# Patient Record
Sex: Female | Born: 1955 | Race: White | Hispanic: No | Marital: Married | Smoking: Never smoker
Health system: Southern US, Community
[De-identification: ages and names within clinical notes are randomized; demographics above are authoritative.]

## PROBLEM LIST (undated history)

## (undated) DIAGNOSIS — K589 Irritable bowel syndrome without diarrhea: Secondary | ICD-10-CM

## (undated) DIAGNOSIS — F319 Bipolar disorder, unspecified: Secondary | ICD-10-CM

## (undated) DIAGNOSIS — F419 Anxiety disorder, unspecified: Secondary | ICD-10-CM

## (undated) DIAGNOSIS — K219 Gastro-esophageal reflux disease without esophagitis: Secondary | ICD-10-CM

## (undated) DIAGNOSIS — R922 Inconclusive mammogram: Secondary | ICD-10-CM

## (undated) DIAGNOSIS — K2980 Duodenitis without bleeding: Secondary | ICD-10-CM

## (undated) DIAGNOSIS — Z87442 Personal history of urinary calculi: Secondary | ICD-10-CM

## (undated) DIAGNOSIS — M797 Fibromyalgia: Secondary | ICD-10-CM

## (undated) DIAGNOSIS — G629 Polyneuropathy, unspecified: Secondary | ICD-10-CM

## (undated) DIAGNOSIS — F329 Major depressive disorder, single episode, unspecified: Secondary | ICD-10-CM

## (undated) DIAGNOSIS — I10 Essential (primary) hypertension: Secondary | ICD-10-CM

## (undated) DIAGNOSIS — F32A Depression, unspecified: Secondary | ICD-10-CM

## (undated) DIAGNOSIS — E785 Hyperlipidemia, unspecified: Secondary | ICD-10-CM

## (undated) DIAGNOSIS — E119 Type 2 diabetes mellitus without complications: Secondary | ICD-10-CM

## (undated) HISTORY — PX: COLONOSCOPY: SHX174

## (undated) HISTORY — DX: Inconclusive mammogram: R92.2

## (undated) HISTORY — DX: Anxiety disorder, unspecified: F41.9

## (undated) HISTORY — DX: Duodenitis without bleeding: K29.80

## (undated) HISTORY — PX: CHOLECYSTECTOMY: SHX55

## (undated) HISTORY — DX: Fibromyalgia: M79.7

## (undated) HISTORY — DX: Hyperlipidemia, unspecified: E78.5

## (undated) HISTORY — PX: UPPER GASTROINTESTINAL ENDOSCOPY: SHX188

## (undated) HISTORY — PX: ABDOMINAL HYSTERECTOMY: SHX81

---

## 1998-03-14 ENCOUNTER — Other Ambulatory Visit: Admission: RE | Admit: 1998-03-14 | Discharge: 1998-03-14 | Payer: Self-pay | Admitting: Obstetrics and Gynecology

## 1999-02-01 ENCOUNTER — Ambulatory Visit (HOSPITAL_COMMUNITY): Admission: RE | Admit: 1999-02-01 | Discharge: 1999-02-01 | Payer: Self-pay | Admitting: Internal Medicine

## 1999-02-01 ENCOUNTER — Encounter: Payer: Self-pay | Admitting: Internal Medicine

## 1999-02-09 ENCOUNTER — Inpatient Hospital Stay (HOSPITAL_COMMUNITY): Admission: EM | Admit: 1999-02-09 | Discharge: 1999-02-11 | Payer: Self-pay | Admitting: Gastroenterology

## 1999-05-20 ENCOUNTER — Emergency Department (HOSPITAL_COMMUNITY): Admission: EM | Admit: 1999-05-20 | Discharge: 1999-05-20 | Payer: Self-pay | Admitting: *Deleted

## 2000-10-08 ENCOUNTER — Inpatient Hospital Stay (HOSPITAL_COMMUNITY): Admission: RE | Admit: 2000-10-08 | Discharge: 2000-10-11 | Payer: Self-pay | Admitting: Family Medicine

## 2000-10-09 ENCOUNTER — Encounter: Payer: Self-pay | Admitting: Family Medicine

## 2000-11-22 ENCOUNTER — Ambulatory Visit (HOSPITAL_COMMUNITY): Admission: RE | Admit: 2000-11-22 | Discharge: 2000-11-22 | Payer: Self-pay | Admitting: Internal Medicine

## 2001-07-16 ENCOUNTER — Encounter (HOSPITAL_COMMUNITY): Admission: RE | Admit: 2001-07-16 | Discharge: 2001-08-15 | Payer: Self-pay | Admitting: Internal Medicine

## 2001-07-16 ENCOUNTER — Emergency Department (HOSPITAL_COMMUNITY): Admission: EM | Admit: 2001-07-16 | Discharge: 2001-07-16 | Payer: Self-pay | Admitting: Emergency Medicine

## 2001-08-18 ENCOUNTER — Encounter (HOSPITAL_COMMUNITY): Admission: RE | Admit: 2001-08-18 | Discharge: 2001-09-17 | Payer: Self-pay | Admitting: Internal Medicine

## 2001-09-15 ENCOUNTER — Encounter (HOSPITAL_COMMUNITY): Admission: RE | Admit: 2001-09-15 | Discharge: 2001-10-15 | Payer: Self-pay | Admitting: Oncology

## 2001-09-15 ENCOUNTER — Encounter: Admission: RE | Admit: 2001-09-15 | Discharge: 2001-09-15 | Payer: Self-pay | Admitting: Oncology

## 2001-09-25 ENCOUNTER — Other Ambulatory Visit: Admission: RE | Admit: 2001-09-25 | Discharge: 2001-09-25 | Payer: Self-pay | Admitting: Obstetrics and Gynecology

## 2001-10-08 ENCOUNTER — Encounter: Payer: Self-pay | Admitting: Urology

## 2001-10-08 ENCOUNTER — Ambulatory Visit (HOSPITAL_COMMUNITY): Admission: RE | Admit: 2001-10-08 | Discharge: 2001-10-08 | Payer: Self-pay | Admitting: Urology

## 2001-11-26 ENCOUNTER — Encounter (HOSPITAL_COMMUNITY): Admission: RE | Admit: 2001-11-26 | Discharge: 2001-12-26 | Payer: Self-pay | Admitting: Oncology

## 2001-11-26 ENCOUNTER — Encounter: Admission: RE | Admit: 2001-11-26 | Discharge: 2001-11-26 | Payer: Self-pay | Admitting: Oncology

## 2002-05-20 ENCOUNTER — Encounter: Admission: RE | Admit: 2002-05-20 | Discharge: 2002-06-03 | Payer: Self-pay | Admitting: Oncology

## 2002-05-20 ENCOUNTER — Encounter (HOSPITAL_COMMUNITY): Admission: RE | Admit: 2002-05-20 | Discharge: 2002-06-03 | Payer: Self-pay | Admitting: Oncology

## 2002-06-09 ENCOUNTER — Observation Stay (HOSPITAL_COMMUNITY): Admission: RE | Admit: 2002-06-09 | Discharge: 2002-06-10 | Payer: Self-pay | Admitting: Obstetrics and Gynecology

## 2002-07-14 ENCOUNTER — Encounter: Admission: RE | Admit: 2002-07-14 | Discharge: 2002-07-14 | Payer: Self-pay | Admitting: Oncology

## 2002-07-14 ENCOUNTER — Encounter (HOSPITAL_COMMUNITY): Admission: RE | Admit: 2002-07-14 | Discharge: 2002-08-13 | Payer: Self-pay | Admitting: Oncology

## 2003-01-21 ENCOUNTER — Encounter (HOSPITAL_COMMUNITY): Admission: RE | Admit: 2003-01-21 | Discharge: 2003-02-20 | Payer: Self-pay | Admitting: Oncology

## 2003-01-21 ENCOUNTER — Encounter: Admission: RE | Admit: 2003-01-21 | Discharge: 2003-01-21 | Payer: Self-pay | Admitting: Oncology

## 2003-06-03 ENCOUNTER — Emergency Department (HOSPITAL_COMMUNITY): Admission: EM | Admit: 2003-06-03 | Discharge: 2003-06-03 | Payer: Self-pay | Admitting: Emergency Medicine

## 2003-06-07 ENCOUNTER — Emergency Department (HOSPITAL_COMMUNITY): Admission: EM | Admit: 2003-06-07 | Discharge: 2003-06-07 | Payer: Self-pay | Admitting: Emergency Medicine

## 2003-06-23 ENCOUNTER — Encounter: Admission: RE | Admit: 2003-06-23 | Discharge: 2003-09-21 | Payer: Self-pay | Admitting: Family Medicine

## 2003-09-01 ENCOUNTER — Ambulatory Visit (HOSPITAL_COMMUNITY): Admission: RE | Admit: 2003-09-01 | Discharge: 2003-09-01 | Payer: Self-pay | Admitting: Family Medicine

## 2003-11-19 ENCOUNTER — Encounter: Admission: RE | Admit: 2003-11-19 | Discharge: 2003-11-19 | Payer: Self-pay | Admitting: Oncology

## 2003-11-19 ENCOUNTER — Ambulatory Visit (HOSPITAL_COMMUNITY): Payer: Self-pay | Admitting: Oncology

## 2003-11-19 ENCOUNTER — Encounter (HOSPITAL_COMMUNITY): Admission: RE | Admit: 2003-11-19 | Discharge: 2003-12-19 | Payer: Self-pay | Admitting: Oncology

## 2004-05-19 ENCOUNTER — Ambulatory Visit (HOSPITAL_COMMUNITY): Admission: RE | Admit: 2004-05-19 | Discharge: 2004-05-19 | Payer: Self-pay | Admitting: Family Medicine

## 2006-02-18 ENCOUNTER — Emergency Department (HOSPITAL_COMMUNITY): Admission: EM | Admit: 2006-02-18 | Discharge: 2006-02-18 | Payer: Self-pay | Admitting: Emergency Medicine

## 2010-02-09 ENCOUNTER — Inpatient Hospital Stay (HOSPITAL_COMMUNITY)
Admission: EM | Admit: 2010-02-09 | Discharge: 2010-02-13 | DRG: 057 | Disposition: A | Discharge status: Home / Self Care | Attending: Internal Medicine | Admitting: Internal Medicine

## 2010-02-09 DIAGNOSIS — F1320 Sedative, hypnotic or anxiolytic dependence, uncomplicated: Secondary | ICD-10-CM | POA: Diagnosis present

## 2010-02-09 DIAGNOSIS — F319 Bipolar disorder, unspecified: Secondary | ICD-10-CM | POA: Diagnosis present

## 2010-02-09 DIAGNOSIS — R110 Nausea: Secondary | ICD-10-CM | POA: Diagnosis present

## 2010-02-09 DIAGNOSIS — T4275XA Adverse effect of unspecified antiepileptic and sedative-hypnotic drugs, initial encounter: Secondary | ICD-10-CM | POA: Diagnosis present

## 2010-02-09 DIAGNOSIS — F411 Generalized anxiety disorder: Secondary | ICD-10-CM | POA: Diagnosis present

## 2010-02-09 DIAGNOSIS — T426X5A Adverse effect of other antiepileptic and sedative-hypnotic drugs, initial encounter: Secondary | ICD-10-CM | POA: Diagnosis present

## 2010-02-09 DIAGNOSIS — I10 Essential (primary) hypertension: Secondary | ICD-10-CM | POA: Diagnosis present

## 2010-02-09 DIAGNOSIS — E119 Type 2 diabetes mellitus without complications: Secondary | ICD-10-CM | POA: Diagnosis present

## 2010-02-09 DIAGNOSIS — T438X5A Adverse effect of other psychotropic drugs, initial encounter: Secondary | ICD-10-CM | POA: Diagnosis present

## 2010-02-09 DIAGNOSIS — T450X5A Adverse effect of antiallergic and antiemetic drugs, initial encounter: Secondary | ICD-10-CM | POA: Diagnosis present

## 2010-02-09 DIAGNOSIS — G259 Extrapyramidal and movement disorder, unspecified: Principal | ICD-10-CM | POA: Diagnosis present

## 2010-02-09 DIAGNOSIS — E876 Hypokalemia: Secondary | ICD-10-CM | POA: Diagnosis present

## 2010-02-09 LAB — URINALYSIS, ROUTINE W REFLEX MICROSCOPIC
Hgb urine dipstick: NEGATIVE
Nitrite: NEGATIVE
Protein, ur: NEGATIVE mg/dL
Specific Gravity, Urine: 1.02 (ref 1.005–1.030)
Urobilinogen, UA: 0.2 mg/dL (ref 0.0–1.0)

## 2010-02-09 LAB — DIFFERENTIAL
Basophils Absolute: 0 10*3/uL (ref 0.0–0.1)
Basophils Relative: 0 % (ref 0–1)
Eosinophils Absolute: 0 10*3/uL (ref 0.0–0.7)
Eosinophils Relative: 0 % (ref 0–5)
Lymphocytes Relative: 4 % — ABNORMAL LOW (ref 12–46)
Lymphs Abs: 0.5 10*3/uL — ABNORMAL LOW (ref 0.7–4.0)
Monocytes Absolute: 0.4 10*3/uL (ref 0.1–1.0)
Monocytes Relative: 4 % (ref 3–12)
Neutro Abs: 11 10*3/uL — ABNORMAL HIGH (ref 1.7–7.7)
Neutrophils Relative %: 92 % — ABNORMAL HIGH (ref 43–77)

## 2010-02-09 LAB — COMPREHENSIVE METABOLIC PANEL
AST: 27 U/L (ref 0–37)
BUN: 13 mg/dL (ref 6–23)
CO2: 19 mEq/L (ref 19–32)
Calcium: 9.8 mg/dL (ref 8.4–10.5)
Creatinine, Ser: 1.15 mg/dL (ref 0.4–1.2)
GFR calc Af Amer: 59 mL/min — ABNORMAL LOW (ref >60)
GFR calc non Af Amer: 49 mL/min — ABNORMAL LOW (ref >60)

## 2010-02-09 LAB — CBC
Hemoglobin: 12 g/dL (ref 12.0–15.0)
MCH: 30.7 pg (ref 26.0–34.0)
MCHC: 33.9 g/dL (ref 30.0–36.0)
MCV: 90.5 fL (ref 78.0–100.0)
Platelets: 174 10*3/uL (ref 150–400)

## 2010-02-10 LAB — LITHIUM LEVEL: Lithium Lvl: 1.79 mEq/L (ref 0.80–1.40)

## 2010-02-10 LAB — BASIC METABOLIC PANEL
BUN: 13 mg/dL (ref 6–23)
Calcium: 9 mg/dL (ref 8.4–10.5)
Creatinine, Ser: 0.99 mg/dL (ref 0.4–1.2)
GFR calc Af Amer: >60 mL/min (ref >60)
GFR calc non Af Amer: 58 mL/min — ABNORMAL LOW (ref >60)

## 2010-02-10 LAB — TSH: TSH: 1.403 u[IU]/mL (ref 0.350–4.500)

## 2010-02-10 LAB — GLUCOSE, CAPILLARY: Glucose-Capillary: 258 mg/dL — ABNORMAL HIGH (ref 70–99)

## 2010-02-11 LAB — BASIC METABOLIC PANEL
BUN: 9 mg/dL (ref 6–23)
Chloride: 112 mEq/L (ref 96–112)
Glucose, Bld: 143 mg/dL — ABNORMAL HIGH (ref 70–99)
Potassium: 3.5 mEq/L (ref 3.5–5.1)
Sodium: 143 mEq/L (ref 135–145)

## 2010-02-11 LAB — GLUCOSE, CAPILLARY
Glucose-Capillary: 158 mg/dL — ABNORMAL HIGH (ref 70–99)
Glucose-Capillary: 146 mg/dL — ABNORMAL HIGH (ref 70–99)

## 2010-02-11 LAB — HEMOGLOBIN A1C: Hgb A1c MFr Bld: 4.8 % (ref <5.7)

## 2010-02-12 LAB — GLUCOSE, CAPILLARY
Glucose-Capillary: 155 mg/dL — ABNORMAL HIGH (ref 70–99)
Glucose-Capillary: 157 mg/dL — ABNORMAL HIGH (ref 70–99)
Glucose-Capillary: 186 mg/dL — ABNORMAL HIGH (ref 70–99)
Glucose-Capillary: 163 mg/dL — ABNORMAL HIGH (ref 70–99)

## 2010-02-13 LAB — COMPREHENSIVE METABOLIC PANEL
ALT: 21 U/L (ref 0–35)
CO2: 26 mEq/L (ref 19–32)
Calcium: 9.2 mg/dL (ref 8.4–10.5)
Creatinine, Ser: 0.74 mg/dL (ref 0.4–1.2)
GFR calc Af Amer: >60 mL/min (ref >60)
GFR calc non Af Amer: >60 mL/min (ref >60)
Glucose, Bld: 136 mg/dL — ABNORMAL HIGH (ref 70–99)
Sodium: 144 mEq/L (ref 135–145)
Total Protein: 6 g/dL (ref 6.0–8.3)

## 2010-02-13 LAB — GLUCOSE, CAPILLARY: Glucose-Capillary: 133 mg/dL — ABNORMAL HIGH (ref 70–99)

## 2010-02-17 NOTE — Discharge Summary (Signed)
[name], [name]                ACCOUNT NO.:  [id number]  MEDICAL RECORD NO.:  [id number]           PATIENT TYPE:  I  LOCATION:  A329                          FACILITY:  [facility]  PHYSICIAN:  Elliot Cousin, M.D.    DATE OF BIRTH:  03/09/1955  DATE OF ADMISSION:  02/09/2010 DATE OF DISCHARGE:  02/13/2012LH                              DISCHARGE SUMMARY   DISCHARGE DIAGNOSES: 1. Lithium toxicity.  The patient's lithium level was 2.24 on     admission and 0.87 at the time of hospital discharge (normal range     is 0.8-1.4). 2. Probable extrapyramidal symptoms secondary to a combination of     Zofran and Phenergan. 3. Hypokalemia. 4. Nausea, secondary to lithium toxicity. 5. Bipolar disorder, which remained stable. 6. Type 2 diabetes mellitus.  The patient's hemoglobin A1c was 4.8. 7. Chronic anxiety, benzodiazepine dependent. 8. Hypertension, well controlled during the hospitalization.  DISCHARGE MEDICATIONS: 1. Lithium carbonate 300 mg daily.  The dose was reduced from two     capsules at bedtime to one capsule at bedtime. 2. WelChol 625 mg three tablets twice daily. 3. Xanax 2 mg half a tablet to one tablet three times daily as needed     for anxiety. 4. AcipHex 20 mg daily. 5. Fluoxetine 40 mg two tablets daily. 6. Hyzaar 100/25 mg daily. 7. Metformin 500 mg twice daily.  DISCHARGE DISPOSITION:  The patient was discharged to home in improved and stable condition on February 13, 2010.  She will follow up with her primary care physician Dr. Phillips Odor on February 20, 2010 at 1:30 p.m. She was advised to follow up with her Psychiatrist, Dr. Tiajuana Amass at his next available appointment or in 1-2 weeks.  CONSULTATIONS:  None.  PROCEDURE PERFORMED:  None.  HISTORY OF PRESENTING ILLNESS:  The patient is a 55 year old woman with a past medical history significant for bipolar disorder, anxiety, type 2 diabetes mellitus, and hypertension.  She presented to the  emergency department on February 09, 2010, with a chief complaint of increased anxiety and shaking.  The patient had presented to her primary care physician's office for a chief complaint of nausea and vomiting the day before she presented to the emergency department.  Apparently, she received to intramuscular injections of Phenergan.  The nausea had subsided, but she developed uncontrollable shakes and twitching.  In the emergency department, her lithium level was found to be in the toxic range of 2.24.  Her electrolytes were within normal limits  except her potassium was low at 3.1.  She was admitted for further evaluation and management.  HOSPITAL COURSE: 1. Lithium toxicity and extrapyramidal side effects from Phenergan and     Zofran.  Lithium, Zofran, and Phenergan were discontinued     initially.  Benadryl was ordered every 6 hours as needed for     shaking and tremors.  Ativan was ordered as needed as well while     Xanax was being held temporarily.  She was started on IV fluid     hydration.  Her potassium chloride was repleted orally and in the  IV fluids.  Her neurological status was monitored closely.  Her     lithium level the next day decreased to 1.79 and then subsequently     to 0.87.  When it returned to the therapeutic range, lithium was     restarted at half the dose, 300 mg nightly instead of 600 mg     nightly.  Upon review of the patient's medication bottles, it was     noted that she was prescribed 4 mg tablets of Zofran, two tablets     every 6 hours as needed for nausea on January 13, 2010.  Ninety     tablets were written for.  The bottle was completely empty.  I     questioned the patient about this and she does not recall taking     more than what was needed.  Apparently, the patient had taken 90     Zofran tablets in a 2-week period.  Also, her Xanax pill bottle was     completely empty.  The patient did have confusion, tremor     and an increase in  anxiety.  All of the symptoms subsided and then     completely resolved at the time of hospital discharge.  She also     had a mildly ataxic gait initially.  When she was evaluated     by the physical therapist prior to discharge, there was no evidence     of ataxia.  The patient was     instructed to take her medications as prescribed, no more and no     less than prescribed.  At her followup appointment next week, she     will need to have her lithium level and electrolytes reassessed. 2. Bipolar disorder.  The patient's bipolar disorder remained stable.     There was no evidence of exacerbation. 3. Type 2 diabetes mellitus.  The patients capillary blood glucose was     modestly-to-moderately elevated.  Metformin was temporarily     withheld.  She was treated with sliding scale NovoLog.  Her     hemoglobin A1c was noted to be excellent at 4.8. 4. Hypertension.  The patient's blood pressure was on the lower end of     normal initially.  However, it started trending upward.  Hyzaar,     which had been withheld initially was restarted at the time of     discharge. 5. Hypokalemia.  The patient's serum potassium was 3.1 on admission.     She was repleted with potassium chloride orally.  Prior to     discharge, her serum sodium was 3.6.  She will be resuming Hyzaar     upon discharge.  DISCHARGE LABORATORY RESULTS:  Sodium 144, potassium 3.6, chloride 109, CO2 26, glucose 136, BUN 14, creatinine 0.74.  Total bilirubin 0.4, alkaline phosphatase 65, SGOT 15, SGPT 21, total protein 6.0, albumin 3.4, calcium 9.2, TSH 1.4.     Elliot Cousin, M.D.     DF/MEDQ  D:  02/13/2010  T:  02/14/2010  Job:  [id number]  cc:   Corrie Mckusick, M.D. Fax: [phone]  Electronically Signed by Elliot Cousin M.D. on 02/17/2010 09:34:41 AM

## 2010-02-22 NOTE — H&P (Signed)
[name], [name] NO.:  [id number]  MEDICAL RECORD NO.:  [id number]           PATIENT TYPE:  E  LOCATION:  [facility]                          FACILITY:  [facility]  PHYSICIAN:  Houston Siren, MD           DATE OF BIRTH:  04/26/1955  DATE OF ADMISSION:  02/09/2010 DATE OF DISCHARGE:  LH                             HISTORY & PHYSICAL   PRIMARY CARE PHYSICIAN:  Dr. Audrea Muscat.  ADVANCE DIRECTIVE:  Full code.  REASON FOR ADMISSION:  Increased anxiety and shaking.  HISTORY OF PRESENT ILLNESS:  This is a 55 year old female with history of bipolar, anxiety, depression, who is on 80 mg of Prozac and lithium, developed nausea and vomiting yesterday, and presented to her primary care physician, and has gotten 2 intramuscular shot of Phenergan.  In the past, Phenergan has made her better, but this time she developed uncontrollable shakes and twitches.  Her nausea has improved.  She denied any headache, confusion, abdominal cramps or pain, chest pain or shortness of breath.  No localized seizure activity or bowel or bladder incontinence.  Her sister who is at her bedside stated that she has been on phenothiazine in the past and has developed lip smacking and tongue darting, which is consistent with tardive dyskinesia, but generally it is not really bad.  Currently, she is not on any of those medications. Evaluation in the emergency room shows lithium level slightly elevated at 2.4.  She has a normal white count of 11.9, hemoglobin of 12.0, creatinine of 1.15.  Her EKG, although suboptimal, shows sinus rhythm at 97 without any acute ST-T changes.  She maintained hemodynamic stability.  PAST MEDICAL HISTORY:  Hypertension, diabetes, irritable bowel syndrome.  SOCIAL HISTORY:  She is married and has grown children.  Her husband was in a Hotel [employer].  ALLERGIES:  NO KNOWN DRUG ALLERGIES.  CURRENT MEDICATIONS:  WelChol b.i.d., Hyzaar, metformin 500 mg once a day, Aciphex 20 mg per  day, lithium carbonate 200 mg 2 at bedtime, fluoxetine 40 mg 2 tablets p.o. q.a.m.  REVIEW OF SYSTEMS:  Otherwise unremarkable.  PHYSICAL EXAMINATION:  VITAL SIGNS:  Blood pressure of 130/63, heart rate of 90, respiratory rate of 18, temp 98.4. GENERAL:  She is quite nervous and has lip smacking and tongue darting. She has twitching both upper and lower extremity and has active seizure. HEENT:  Sclerae are nonicteric.  Funduscopic exam is benign.  Pupils are equal, round, and reactive to light.  She has no rigidity. NECK:  Supple. CARDIAC:  S1, S2, regular. LUNGS:  Clear. ABDOMEN:  Soft, nondistended, and nontender.  Bowel sounds are present. NEUROLOGIC:  Babinski is up.  Strengths equal bilaterally.  Speech is fluent.  LABORATORY DATA:  EKG shows sinus rhythm at 97.  Urinalysis is negative. Serum sodium 138, potassium 3.1, creatinine 1.1.  White count of 11.9 thousand, hemoglobin of 12.0.  Lithium level of 2.2.  IMPRESSION:  This is a 55 year old female who presents to emergency room with increased anxiety and having diffuse twitching and dyskinesia.  I suspect that she suffered from a known allergic  reaction to Phenergan.  It may act longer because it was given intramuscularly x2.  She could also have side effect of tremor from lithium toxicity with nausea as well.  The level was not really that high, and she has no seizure activity or altered mental status with this lithium level.  We will admit her to telemetry.  Obviously, we will stop the Phenergan.  We will give intravenous fluids and benzodiazepines.  Benadryl IV may help also.  I am not convinced that she should be on lithium long-term, but we will defer that to her psychiatrist.  I would like to continue her Prozac, but we will lower the dose to 40 mg while she is acutely ill at this time.  She is quite stable and we will admit her to telemetry unit to AP2.  She is a full code.     Houston Siren, MD     PL/MEDQ  D:   02/09/2010  T:  02/10/2010  Job:  [id number]  Electronically Signed by Houston Siren  on 02/22/2010 02:46:19 AM

## 2011-06-27 ENCOUNTER — Other Ambulatory Visit (HOSPITAL_COMMUNITY): Payer: Self-pay | Admitting: Physician Assistant

## 2011-06-27 DIAGNOSIS — R5381 Other malaise: Secondary | ICD-10-CM

## 2011-06-27 DIAGNOSIS — R51 Headache: Secondary | ICD-10-CM

## 2011-06-27 DIAGNOSIS — R5383 Other fatigue: Secondary | ICD-10-CM

## 2011-06-27 DIAGNOSIS — R42 Dizziness and giddiness: Secondary | ICD-10-CM

## 2011-06-29 ENCOUNTER — Ambulatory Visit (HOSPITAL_COMMUNITY)
Admission: RE | Admit: 2011-06-29 | Discharge: 2011-06-29 | Disposition: A | Discharge status: Home / Self Care | Source: Ambulatory Visit | Attending: Physician Assistant | Admitting: Physician Assistant

## 2011-06-29 DIAGNOSIS — R51 Headache: Secondary | ICD-10-CM

## 2011-06-29 DIAGNOSIS — R5381 Other malaise: Secondary | ICD-10-CM

## 2011-06-29 DIAGNOSIS — R42 Dizziness and giddiness: Secondary | ICD-10-CM

## 2011-06-29 DIAGNOSIS — R4182 Altered mental status, unspecified: Secondary | ICD-10-CM | POA: Insufficient documentation

## 2011-06-29 DIAGNOSIS — R5383 Other fatigue: Secondary | ICD-10-CM

## 2011-11-07 ENCOUNTER — Ambulatory Visit (HOSPITAL_COMMUNITY)
Admission: RE | Admit: 2011-11-07 | Discharge: 2011-11-07 | Disposition: A | Discharge status: Home / Self Care | Source: Ambulatory Visit | Attending: Neurology | Admitting: Neurology

## 2011-11-07 DIAGNOSIS — IMO0001 Reserved for inherently not codable concepts without codable children: Secondary | ICD-10-CM | POA: Insufficient documentation

## 2011-11-07 DIAGNOSIS — M6281 Muscle weakness (generalized): Secondary | ICD-10-CM | POA: Insufficient documentation

## 2011-11-07 DIAGNOSIS — R269 Unspecified abnormalities of gait and mobility: Secondary | ICD-10-CM | POA: Insufficient documentation

## 2011-11-07 NOTE — Evaluation (Signed)
Physical Therapy Evaluation  Patient Details  Name: [name] MRN: [id number] Date of Birth: [birth year]  Today's Date: 11/07/2011 Time: 1305-1350 PT Time Calculation (min): 45 min Charges: 1 eval  Visit#: 1  of 8   Re-eval: 12/07/11 Assessment Diagnosis: Gait disturbances Next MD Visit: Dr. Lake Bells  Subjective Symptoms/Limitations Symptoms: PMH: Bipolar, DMII, HTN, hyperlipidema, IBS, acid reflux and difficulty sleeping.  Pertinent History: Pt is referred to PT for gait disturbances.  She reports that she use her furniture walking in her home and walks with a RW or SPC when outdoors.  She reports that about 1 year ago she went to the hospital (after laying in bed for 3 weeks) for 5 days and believes that is when her balance problems began.  She reports she has fallen about 6 times in the last year.  She initally was diagnosed with PD, however after a visit with another MD they diagnosed her with Lithium toxcity which is likely causing her to have gait disturbances.  She is taking lithium for her bipolar disorder and was instructed to continue with Lithuim as it is the best drug for her condition.  She also has complains of L hand weakness which causes her inability to write or grab objects.  Pain Assessment Currently in Pain?: Yes Pain Score:   5 Pain Location: Buttocks ("it feels like my butt has been paddled") Pain Orientation: Right;Left  Precautions/Restrictions  Precautions Precautions: Fall  Prior Functioning     Cognition/Observation Observation/Other Assessments Observations: mild tartidive dyskensia.   Sensation/Coordination/Flexibility/Functional Tests Functional Tests Functional Tests: ABC: 47.5%  Assessment RLE Strength Right Hip Flexion: 3+/5 Right Hip Extension: 3+/5 Right Hip ABduction: 4/5 Right Hip ADduction: 3/5 Right Knee Flexion: 4/5 Right Knee Extension: 5/5 Right Ankle Dorsiflexion: 4/5 LLE Strength Left Hip Flexion: 3/5 Left Hip  Extension: 3+/5 Left Hip ABduction: 3/5 Left Hip ADduction: 3/5 Left Knee Flexion: 4/5 Left Knee Extension: 5/5 Left Ankle Dorsiflexion: 4/5 Cervical Assessment Cervical Assessment: Within Functional Limits Palpation Palpation: increased pain and tenderness to B gastroc and hip flexor region (to L LE)  Mobility/Balance  Ambulation/Gait Ambulation/Gait: Yes Gait Pattern: Wide base of support;Decreased trunk rotation (Decreased pelvic rotation) Gait velocity: decreased Static Standing Balance Static Standing - Comment/# of Minutes: each position held for a max of 10 sec Single Leg Stance - Right Leg: 0  Single Leg Stance - Left Leg: 0  Tandem Stance - Right Leg: 0  Tandem Stance - Left Leg: 0  Rhomberg - Eyes Opened: 10  Rhomberg - Eyes Closed: 5  Berg Balance Test Sit to Stand: Able to stand  independently using hands Standing Unsupported: Able to stand 2 minutes with supervision Sitting with Back Unsupported but Feet Supported on Floor or Stool: Able to sit safely and securely 2 minutes Stand to Sit: Controls descent by using hands Transfers: Able to transfer safely, minor use of hands Standing Unsupported with Eyes Closed: Able to stand 10 seconds safely Standing Ubsupported with Feet Together: Able to place feet together independently but unable to hold for 30 seconds From Standing, Reach Forward with Outstretched Arm: Can reach confidently >25 cm (10") From Standing Position, Pick up Object from Floor: Able to pick up shoe, needs supervision From Standing Position, Turn to Look Behind Over each Shoulder: Looks behind one side only/other side shows less weight shift Turn 360 Degrees: Able to turn 360 degrees safely one side only in 4 seconds or less Standing Unsupported, Alternately Place Feet on Step/Stool: Able to complete >2  steps/needs minimal assist Standing Unsupported, One Foot in Front: Able to take small step independently and hold 30 seconds Standing on One Leg:  Tries to lift leg/unable to hold 3 seconds but remains standing independently Total Score: 40    Exercise/Treatments Standing Heel Raises: 5 reps;Limitations Heel Raises Limitations: 5 reps Functional Squat: 5 reps Standing Eyes Opened: Narrow base of support (BOS);Solid surface;10 secs Tandem Stance: Eyes open;Hand held assist (HHA) 1;1 rep;10 secs;Limitations (staggard stance) Tandem Stance Limitations: with head rotation and flexion/extension   Physical Therapy Assessment and Plan PT Assessment and Plan Clinical Impression Statement: Pt is a 56 year old female with long standing hx of mental illness and has been taking Lithium for 20 years and been diagnosed with Lithum toxcitiy in February 2013.  Since her hospital stay she has encountered gait disturbances leading to at least 6 falls since Feb.  RECOMMEND OT REFERAL FOR HAND WEAKNESS AND INABILITY TO WRITE Pt will benefit from skilled therapeutic intervention in order to improve on the following deficits: Abnormal gait;Decreased balance;Decreased strength;Difficulty walking Rehab Potential: Good PT Frequency: Min 2X/week PT Duration: 8 weeks PT Treatment/Interventions: DME instruction;Gait training;Stair training;Functional mobility training;Therapeutic activities;Therapeutic exercise;Balance training;Neuromuscular re-education;Patient/family education PT Plan: Address low level balance activities and general LE strengthening: feet together, eyes closed (add pertubations when able), foot on step, stair training, rocker board, LAQ's, 4 way SLR's, squats, heel and toe raises.  Progress when able to tandem gait, retro gait and heel and toe walking,  Continue to emphasis pelvic and cervical rotation.     Goals Home Exercise Program Pt will Perform Home Exercise Program: Independently PT Goal: Perform Home Exercise Program - Progress: Goal set today PT Short Term Goals Time to Complete Short Term Goals: 4 weeks PT Short Term Goal 1: Pt  will improve her LE strength by 1 muscle grade in order to tolerate standing for greater than 30 minutes.  PT Short Term Goal 2: Pt will complete the DGI.  PT Short Term Goal 3: Pt will improve her static balance and demonstrate R and L SLS on static surface x10 sec.  PT Short Term Goal 4: Pt will improve her dynamic balance and begin outdoor ambulation with LRAD.  PT Long Term Goals Time to Complete Long Term Goals: 8 weeks PT Long Term Goal 1: Pt will improve her dynamic balance and LEstrength to [facility] in order to ambualte with LRAD w/supervision x20 minutes in order to attend a concert at [facility] with her family.  PT Long Term Goal 2: Pt will improve her Berg score to 50/56 and her DGI to 20/24 for improved safety with household and community ambualtion.  Long Term Goal 3: Pt will improve her ABD to greater than 60% for improved percieved functional ability.   Problem List Patient Active Problem List  Diagnosis  . Abnormality of gait  . Muscle weakness (generalized)    PT Plan of Care PT Home Exercise Plan: see scanned report.  PT Patient Instructions: Discussed calling to schedule appointment.  Consulted and Agree with Plan of Care: Patient  Annett Fabian, PT 11/07/2011, 2:31 PM  Physician Documentation Your signature is required to indicate approval of the treatment plan as stated above.  Please sign and either send electronically or make a copy of this report for your files and return this physician signed original.   Please mark one 1.__approve of plan  2. ___approve of plan with the following conditions.   ______________________________  _____________________ Physician Signature                                                                                                             Date

## 2011-11-14 ENCOUNTER — Ambulatory Visit (HOSPITAL_COMMUNITY)
Admission: RE | Admit: 2011-11-14 | Discharge: 2011-11-14 | Disposition: A | Discharge status: Home / Self Care | Source: Ambulatory Visit | Attending: Neurology | Admitting: Neurology

## 2011-11-14 NOTE — Progress Notes (Signed)
Physical Therapy Treatment Patient Details  Name: [name] MRN: [id number] Date of Birth: 1955-10-18  Today's Date: 11/14/2011 Time: 1016-1056 PT Time Calculation (min): 40 min Charges: 30' NMR, 10' TE Visit#: 2  of 8   Re-eval: 12/07/11     Subjective: Symptoms/Limitations Symptoms: I am doing some of my exercises at home.  I cannot remember exactly what we talked about though.  Pain Assessment Currently in Pain?: No/denies  Precautions/Restrictions     Exercise/Treatments Standing Heel Raises: 10 reps Heel Raises Limitations: Toe raises 10x Functional Squat: 10 reps Gait Training: to encourage UE and cervical motion using crutches in pt and therapist hands 4 RT, independent gait w/mod cueing for shoulder and cervical rotation x10 minutes  Standing Eyes Opened: Narrow base of support (BOS);Wide (BOA);3 reps;30 secs;Limitations (each) Standing Eyes Opened Limitations: w/head rotation each direction during time Tandem Stance: Eyes open;2 reps;30 secs;Limitations Tandem Stance Limitations: w/head rotations x10 each direction Standing, One Foot on a Step: Eyes open;4 inch;2 reps;30 secs (BLE) Tandem Gait: Forward;2 reps (min A) Retro Gait: 2 reps (Min A) Numbers 1-15: Balance Beam;2 reps;Limitations Numbers 1-15 Limitations: touching each number BUE Other Standing Exercises: Squats w/head rotation x10, heel and toe raisese x10 each  Supine Bridges: 15 reps Straight Leg Raises: Both;15 reps Sidelying Hip ABduction: Both;15 reps  Physical Therapy Assessment and Plan PT Assessment and Plan Clinical Impression Statement: Pt has improved gait mechanics after treatment today and was able to progress to high level balance activities using dynamic surfaces.   PT Plan: Address low level balance activities and general LE strengthening:, eyes closed (add pertubations when able), stair training, rocker board, LAQ's, 4 way SLR's, squats, heel and toe raises.  Continue to emphasis  pelvic and cervical rotation.     Goals    Problem List Patient Active Problem List  Diagnosis  . Abnormality of gait  . Muscle weakness (generalized)    PT - End of Session Equipment Utilized During Treatment: Gait belt Activity Tolerance: Patient tolerated treatment well PT Plan of Care PT Patient Instructions: Discussed and encouraged to continue ambulating with appropriate gait mechanics (arm swing and cervical rortation) Consulted and Agree with Plan of Care: Patient  Alexx Mcburney, PT 11/14/2011, 11:05 AM

## 2011-11-16 ENCOUNTER — Ambulatory Visit (HOSPITAL_COMMUNITY)
Admission: RE | Admit: 2011-11-16 | Discharge: 2011-11-16 | Disposition: A | Discharge status: Home / Self Care | Source: Ambulatory Visit | Attending: Neurology | Admitting: Neurology

## 2011-11-16 NOTE — Progress Notes (Signed)
Physical Therapy Treatment Patient Details  Name: [name] MRN: [id number] Date of Birth: 12-Nov-1955  Today's Date: 11/16/2011 Time: 4098-1191 PT Time Calculation (min): 54 min Charges: 32' NMR,9' TE Visit#: 3  of 8   Re-eval: 12/07/11    Authorization:    Authorization Time Period:    Authorization Visit#:   of     Subjective: Symptoms/Limitations Symptoms: I almost didn't come today because I was up all night thinking about my blood pressure and DM.  Patient Stated Goals: "I want to be able to go back to silver sneakers and go to the [employer] concert" Pain Assessment Currently in Pain?: No/denies  Precautions/Restrictions     Exercise/Treatments Mobility/Balance        Standing Heel Raises: 15 reps Heel Raises Limitations: Toe raises 15x Functional Squat: 10 seconds;Limitations Functional Squat Limitations: w/manual facilitaiton for proper body mechanics Gait Training: to encourage UE movement  and cervical rotation  using manual facilitation 6 RT, independent gait w/mod cueing for shoulder and cervical rotation x10 minutes  Standing Eyes Opened: Narrow base of support (BOS);Wide (BOA);3 reps;30 secs;Limitations (each) Standing Eyes Opened Limitations: w/head rotation each direction during time Tandem Stance: Eyes open;30 secs;Limitations;3 reps Standing, One Foot on a Step: Eyes open;2 reps;30 secs;6 inch;Limitations (BLE) Standing, One Foot on a Step Limitations: w/cervical rotation Tandem Gait: Forward;2 reps (min A) Retro Gait: 2 reps (Min A) Numbers 1-15: Balance Beam;2 reps;Limitations Numbers 1-15 Limitations: touching each number BUE Other Standing Exercises: STS x10 w/o UE A Other Standing Exercises: Forward and Back weight shift 1 min B LE  Supine Bridges: 15 reps (w/hip adduction squeeze) Straight Leg Raises: Both;15 reps Hip Adduction squeeze: 10x10 sec holds Sidelying Hip ABduction: Both;15 reps Hip ADduction: Both;10 reps Seated Other  Seated Exercises: Dyna Disc Forward/Backward and S<>S x 3 mintes each direction w/manual faciliation.    Physical Therapy Assessment and Plan PT Assessment and Plan Clinical Impression Statement: Pt continues to have significant posterior lean with standing and sitting activities which requires max cueing to maintain approrpiate posture.  has improved UE movements with gait activities.  PT Plan: Address low level balance activities and general LE strengthening:, eyes closed (add pertubations when able), stair training, rocker board, LAQ's, 4 way SLR's, squats, heel and toe raises.  Continue to emphasis pelvic and cervical rotation.     Goals    Problem List Patient Active Problem List  Diagnosis  . Abnormality of gait  . Muscle weakness (generalized)    PT - End of Session Equipment Utilized During Treatment: Gait belt Activity Tolerance: Patient tolerated treatment well PT Plan of Care PT Home Exercise Plan: see scanned report update Consulted and Agree with Plan of Care: Patient  GP    Tionna Gigante 11/16/2011, 12:09 PM

## 2011-11-21 ENCOUNTER — Ambulatory Visit (HOSPITAL_COMMUNITY)
Admission: RE | Admit: 2011-11-21 | Discharge: 2011-11-21 | Disposition: A | Discharge status: Home / Self Care | Source: Ambulatory Visit | Attending: Neurology | Admitting: Neurology

## 2011-11-21 NOTE — Progress Notes (Signed)
Physical Therapy Treatment Patient Details  Name: [name] MRN: [id number] Date of Birth: Apr 13, 1955  Today's Date: 11/21/2011 Time: 1010-1100 PT Time Calculation (min): 50 min  Visit#: 4  of 8   Re-eval: 12/07/11  Charge: Gait 12', NMR 23', therex 15'  Subjective: Symptoms/Limitations Symptoms: No pain today, compliance with HEP.  Pt reported she has found ease completeing some daily tasks. Pain Assessment Currently in Pain?: No/denies  Objective:   Exercise/Treatments Standing Heel Raises: 15 reps Heel Raises Limitations: Toe raises 15x Lateral Step Up: Both;10 reps;Hand Hold: 1;Step Height: 4" Forward Step Up: Both;10 reps;Hand Hold: 1;Step Height: 4" Functional Squat: Limitations;15 reps Functional Squat Limitations: w/manual facilitaiton for proper body mechanics Rocker Board: 2 minutes (R/L and A/P with max assistance to reduce posterior lean) Gait Training: to encourage UE movement  and cervical rotation  using manual facilitation 6 RT, independent gait w/mod cueing for shoulder and cervical rotation x12 minutes    Balance Exercises Standing Standing Eyes Opened: Narrow base of support (BOS);3 reps;30 secs;Solid surface Standing Eyes Opened Limitations: w/head rotation each direction during time Tandem Stance: Eyes open;30 secs;Limitations;3 reps Gait with Head Turns (Round Trips): 3 RT with cane for UE movement with gait Cone Rotation: Foam;R/L Other Standing Exercises: STS x10 w/o UE A   Physical Therapy Assessment and Plan PT Assessment and Plan Clinical Impression Statement: Session focus on improving coordination with UE movements and increasing pelvic and cervical rotation.  Added cone rotation to improve balance with pelvic and cervical rotation.  Also added rocker board to improve weight distribution R/L and A/P.  Pt continues to have significant posterior lean requiring max multimodal cueing for spatial awareness to improve posture and reduce risk of  fall. PT Plan: Address low level balance activities and general LE strengthening.  Next session begin step down training, LAQs, continue with 4 way SLR's, squats and heel/toe raises.  Continue to emphasis pelvic and cervical rotation.    Goals    Problem List Patient Active Problem List  Diagnosis  . Abnormality of gait  . Muscle weakness (generalized)    PT - End of Session Equipment Utilized During Treatment: Gait belt Activity Tolerance: Patient tolerated treatment well General Behavior During Session: [facility] for tasks performed Cognition: [facility] for tasks performed  GP    Juel Burrow 11/21/2011, 12:01 PM

## 2011-11-23 ENCOUNTER — Ambulatory Visit (HOSPITAL_COMMUNITY): Admitting: Physical Therapy

## 2011-11-27 ENCOUNTER — Ambulatory Visit (HOSPITAL_COMMUNITY)
Admission: RE | Admit: 2011-11-27 | Discharge: 2011-11-27 | Disposition: A | Discharge status: Home / Self Care | Source: Ambulatory Visit | Attending: Neurology | Admitting: Neurology

## 2011-11-27 NOTE — Progress Notes (Signed)
Physical Therapy Treatment Patient Details  Name: [name] MRN: [id number] Date of Birth: Dec 10, 1955  Today's Date: 11/27/2011 Time: 1101-1147 PT Time Calculation (min): 46 min Visit#: 5  of 8   Re-eval: 12/07/11   Subjective:  Pt. States she is committed to getting better.  States she is not hurting and has not had any LOB or falls since beginning therapy.     Exercise/Treatments Standing Heel Raises: 15 reps Heel Raises Limitations: Toe raises 15x Lateral Step Up: Both;15 reps;Step Height: 4" Forward Step Up: Both;15 reps;Step Height: 4" Step Down: Both;10 reps;Step Height: 4" Functional Squat: Limitations;15 reps Functional Squat Limitations: w/manual facilitaiton for proper body mechanics Rocker Board: 2 minutes (R/L and A/P) Seated Long Arc Quad: 10 reps;Both  Balance Exercises Standing Tandem Stance: Eyes open;30 secs;Limitations;3 reps Gait with Head Turns (Round Trips): 3 RT with cane for UE movement with gait Tandem Gait: Forward;2 reps Retro Gait: 2 reps Sidestepping: 2 reps;Limitations Sidestepping Limitations: with opposite head turns Other Standing Exercises: STS x10 w/o UE A    Physical Therapy Assessment and Plan PT Assessment and Plan Clinical Impression Statement: Pt. able to complete balance activities without LOB, however difficulty coordinating head/LE movements.   Added forward step downs and LAQ without difficulty.   Improved stability with rockerboard activities.  Continues to ambulate with extension posture. PT Plan: Address low level balance activities and general LE strengthening.  Next session begin balance beam.     Problem List Patient Active Problem List  Diagnosis  . Abnormality of gait  . Muscle weakness (generalized)    PT - End of Session Equipment Utilized During Treatment: Gait belt Activity Tolerance: Patient tolerated treatment well General Behavior During Session: [facility] for tasks performed Cognition: [facility] for tasks  performed   Lurena Nida, PTA/CLT 11/27/2011, 12:02 PM

## 2011-12-04 ENCOUNTER — Ambulatory Visit (HOSPITAL_COMMUNITY)
Admission: RE | Admit: 2011-12-04 | Discharge: 2011-12-04 | Disposition: A | Discharge status: Home / Self Care | Source: Ambulatory Visit | Attending: Neurology | Admitting: Neurology

## 2011-12-04 DIAGNOSIS — IMO0001 Reserved for inherently not codable concepts without codable children: Secondary | ICD-10-CM | POA: Insufficient documentation

## 2011-12-04 DIAGNOSIS — R269 Unspecified abnormalities of gait and mobility: Secondary | ICD-10-CM | POA: Insufficient documentation

## 2011-12-04 NOTE — Progress Notes (Signed)
Physical Therapy Treatment Patient Details  Name: [name] MRN: [id number] Date of Birth: [birth year]  Today's Date: 12/04/2011 Time: 4401-0272 PT Time Calculation (min): 63 min  Visit#: 6  of 8   Re-eval: 12/07/11 Assessment Diagnosis: Gait disturbances Next MD Visit: Dr. Lake Bells; Primary physician 12/05/2011 Charge: Gait training 23', NMR 23', therex 17'  Subjective: Symptoms/Limitations Symptoms: No pain today just felt really stiff this morning, think it's because I have not done my exercises in 2 days.   Pain Assessment Currently in Pain?: No/denies  Precautions/Restrictions  Precautions Precautions: Fall  Exercise/Treatments Standing Heel Raises: 15 reps Heel Raises Limitations: Toe raises 15x Lateral Step Up: Both;15 reps;Step Height: 4" Forward Step Up: Both;15 reps;Step Height: 4" Step Down: Both;10 reps;Step Height: 4" Functional Squat: Limitations;15 reps Functional Squat Limitations: w/manual facilitaiton for proper body mechanics Rocker Board: 2 minutes (R/L and A/P) Gait Training: to encourage cervical rotation, UE movement and pelvic rotation with gait.  Outdoor gait training for safe mechanics including incline/decline slopes, steps, curbs x 15' assessing activity tolerance  Balance Exercises Standing Balance Beam: Forward tandem and retro 1 RT Cone Rotation: Foam;R/L Other Standing Exercises: STS x10 w/o UE A Other Standing Exercises: Static standing and gait with pertabation with eyes open to mimic crowd in concert   Physical Therapy Assessment and Plan PT Assessment and Plan Clinical Impression Statement: Progressed balance activities to dynamic surface with min-mod assistance and cueing for spatial awareness.  Began balance activities with pertabation with eyes open static standing and with gait to mimic a crowd in concert pt plans to go to later this month.  Outdoor gait training complete at end this session to improve pt.'s balance and safety  with incline/decline slopes, curbs and step, no LOB epsidoes through session but was limited by activity tolerance.   PT Plan: Reassess prior MD apt next session.    Goals Home Exercise Program Pt will Perform Home Exercise Program: Independently PT Goal: Perform Home Exercise Program - Progress: Progressing toward goal PT Short Term Goals Time to Complete Short Term Goals: 4 weeks PT Short Term Goal 1: Pt will improve her LE strength by 1 muscle grade in order to tolerate standing for greater than 30 minutes.  PT Short Term Goal 2: Pt will complete the DGI.  PT Short Term Goal 3: Pt will improve her static balance and demonstrate R and L SLS on static surface x10 sec.  PT Short Term Goal 4: Pt will improve her dynamic balance and begin outdoor ambulation with LRAD.  PT Short Term Goal 4 - Progress: Progressing toward goal PT Long Term Goals Time to Complete Long Term Goals: 8 weeks PT Long Term Goal 1: Pt will improve her dynamic balance and LEstrength to [facility] in order to ambualte with LRAD w/supervision x20 minutes in order to attend a concert at [facility] with her family.  PT Long Term Goal 1 - Progress: Progressing toward goal PT Long Term Goal 2: Pt will improve her Berg score to 50/56 and her DGI to 20/24 for improved safety with household and community ambualtion.  Long Term Goal 3: Pt will improve her ABC to greater than 60% for improved percieved functional ability.   Problem List Patient Active Problem List  Diagnosis  . Abnormality of gait  . Muscle weakness (generalized)    PT - End of Session Equipment Utilized During Treatment: Gait belt Activity Tolerance: Patient tolerated treatment well General Behavior During Session: [facility] for tasks performed Cognition: [facility] for tasks  performed  GP    Juel Burrow 12/04/2011, 1:26 PM

## 2011-12-06 ENCOUNTER — Ambulatory Visit (HOSPITAL_COMMUNITY)
Admission: RE | Admit: 2011-12-06 | Discharge: 2011-12-06 | Disposition: A | Discharge status: Home / Self Care | Source: Ambulatory Visit | Attending: Neurology | Admitting: Neurology

## 2011-12-06 NOTE — Evaluation (Signed)
Physical Therapy Re-Evaluation  Patient Details  Name: [name] MRN: [id number] Date of Birth: 1955/06/06  Today's Date: 12/06/2011 Time: 4098-1191 PT Time Calculation (min): 18 min Charges: 1 MMT, 15' PPT Visit#: 7  of 16   Re-eval: 01/05/12  Diagnosis: Gait disturbances Next MD Visit: Dr. Lake Bells; Primary physician 12/05/2011  Subjective Symptoms/Limitations Symptoms: Pt reports that she feels she is doing a lot better, but still needs to work on some of her balance.   Sensation/Coordination/Flexibility/Functional Tests Functional Tests Functional Tests: ABC: 60% (was 47.5%)   RLE Strength Right Hip Flexion:  (4+/5, was 3+/5) Right Hip Extension: 4/5 (3+/5) Right Hip ABduction: 4/5 (was 4/5) Right Hip ADduction: 3+/5 Right Knee Flexion: 4/5 (was 4/5) Right Knee Extension: 5/5 (was 5/5) Right Ankle Dorsiflexion: 4/5 (was 4/5)  LLE Strength Left Hip Flexion:  (4+/5, was 3/5) Left Hip Extension: 4/5 (3+/5) Left Hip ABduction: 4/5 (was 3/5) Left Hip ADduction: 3+/5 Left Knee Flexion: 4/5 (was 4/5) Left Knee Extension: 5/5 (was 5/5) Left Ankle Dorsiflexion: 4/5 (was 4/5)  Cervical Assessment: Within Functional Limits  Exercise/Treatments Mobility/Balance  Ambulation/Gait Gait Pattern: Decreased trunk rotation Static Standing Balance Single Leg Stance - Right Leg: 12  (was 0) Single Leg Stance - Left Leg: 7  (was 0) Tandem Stance - Right Leg: 30  (was 0) Tandem Stance - Left Leg: 30  (was 0) Rhomberg - Eyes Opened: 60  (was 10) Rhomberg - Eyes Closed: 15  (was 5) Berg Balance Test Sit to Stand: Able to stand without using hands and stabilize independently Standing Unsupported: Able to stand safely 2 minutes Sitting with Back Unsupported but Feet Supported on Floor or Stool: Able to sit safely and securely 2 minutes Stand to Sit: Sits safely with minimal use of hands Transfers: Able to transfer safely, minor use of hands Standing Unsupported with Eyes  Closed: Able to stand 10 seconds safely Standing Ubsupported with Feet Together: Able to place feet together independently and stand 1 minute safely From Standing, Reach Forward with Outstretched Arm: Can reach confidently >25 cm (10") From Standing Position, Pick up Object from Floor: Able to pick up shoe safely and easily From Standing Position, Turn to Look Behind Over each Shoulder: Looks behind from both sides and weight shifts well Turn 360 Degrees: Able to turn 360 degrees safely in 4 seconds or less Standing Unsupported, Alternately Place Feet on Step/Stool: Able to stand independently and complete 8 steps >20 seconds Standing Unsupported, One Foot in Front: Able to take small step independently and hold 30 seconds Standing on One Leg: Able to lift leg independently and hold 5-10 seconds Total Score: 52  Dynamic Gait Index Level Surface: Mild Impairment Change in Gait Speed: Moderate Impairment Gait with Horizontal Head Turns: Moderate Impairment Gait with Vertical Head Turns: Moderate Impairment Gait and Pivot Turn: Mild Impairment Step Over Obstacle: Moderate Impairment Step Around Obstacles: Mild Impairment Steps: Moderate Impairment Total Score: 11    Physical Therapy Assessment and Plan PT Assessment and Plan Clinical Impression Statement: Ms. 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  She is independent with current HEP, continues to have moderate weakness to hips, knees and ankles causing decrease in ankle and hip strategy.  At this time she feels she has improved overall, but still reports moderate gait difficulties as well as difficulty with handwritting.  RECOMMEND OT  SERVICES FOR COORDINATION FOR UE.  Pt will benefit from skilled therapeutic intervention in order to improve on the following deficits: Abnormal gait;Decreased  balance;Decreased strength;Decreased coordination PT Frequency: Min 2X/week PT Duration: 4 weeks PT Treatment/Interventions: Gait training;Stair training;Functional mobility training;Therapeutic activities;Therapeutic exercise;Balance training;Patient/family education PT Plan: Continue to improve DGI score, improve knee flexion and tibialis anterior strength to improve hip and ankle strategy. Update HEP next visit.    Goals Home Exercise Program Pt will Perform Home Exercise Program: Independently PT Goal: Perform Home Exercise Program - Progress: Met PT Short Term Goals Time to Complete Short Term Goals: 4 weeks PT Short Term Goal 1: Pt will improve her LE strength by 1 muscle grade in order to tolerate standing for greater than 30 minutes.  PT Short Term Goal 2: Pt will complete the DGI.  PT Short Term Goal 2 - Progress: Met PT Short Term Goal 3: Pt will improve her static balance and demonstrate R and L SLS on static surface x10 sec.  PT Short Term Goal 3 - Progress: Partly met (R: 12 sec, L 7 sec) PT Short Term Goal 4: Pt will improve her dynamic balance and begin outdoor ambulation with LRAD.  PT Short Term Goal 4 - Progress: Met PT Long Term Goals Time to Complete Long Term Goals: 8 weeks PT Long Term Goal 1: Pt will improve her dynamic balance and LEstrength to [facility] in order to ambualte with LRAD w/supervision x20 minutes in order to attend a concert at [facility] with her family.  PT Long Term Goal 1 - Progress: Progressing toward goal (15 minutes outdoors) PT Long Term Goal 2: Pt will improve her Berg score to 50/56 and her DGI to 20/24 for improved safety with household and community ambualtion.  PT Long Term Goal 2 - Progress: Partly met Sharlene Motts 52/56; DGI: 11/20) Long Term Goal 3: Pt will improve her ABC to greater than 60% for improved percieved functional ability.   Problem List Patient Active Problem List  Diagnosis  . Abnormality of gait  . Muscle weakness (generalized)     PT - End of Session Equipment Utilized During Treatment: Gait belt Activity Tolerance: Patient tolerated treatment well General Behavior During Session: [facility] for tasks performed Cognition: [facility] for tasks performed  Siani Utke, PT 12/06/2011, 9:18 AM  Physician Documentation Your signature is required to indicate approval of the treatment plan as stated above.  Please sign and either send electronically or make a copy of this report for your files and return this physician signed original.   Please mark one 1.__approve of plan  2. ___approve of plan with the following conditions.   ______________________________                                                          _____________________ Physician Signature  Date  

## 2011-12-11 ENCOUNTER — Inpatient Hospital Stay (HOSPITAL_COMMUNITY): Admission: RE | Admit: 2011-12-11 | Source: Ambulatory Visit

## 2011-12-13 ENCOUNTER — Ambulatory Visit (HOSPITAL_COMMUNITY): Admitting: Physical Therapy

## 2011-12-13 ENCOUNTER — Ambulatory Visit (HOSPITAL_COMMUNITY): Admitting: *Deleted

## 2011-12-17 ENCOUNTER — Ambulatory Visit (HOSPITAL_COMMUNITY): Admitting: Physical Therapy

## 2012-03-18 ENCOUNTER — Telehealth (HOSPITAL_COMMUNITY): Payer: Self-pay | Admitting: Dietician

## 2012-03-18 NOTE — Telephone Encounter (Signed)
Pt registered at attend group diabetes class at [facility] on 03/18/12. However, pt was a no-show.  

## 2012-04-01 ENCOUNTER — Encounter (HOSPITAL_COMMUNITY): Payer: Self-pay | Admitting: Dietician

## 2012-04-01 NOTE — Progress Notes (Signed)
[facility] Diabetes Class Completion  Date:April 01, 2012  Time: 1000  Pt attended  Hospital's Diabetes Group Education Class on April 01, 2012.   Patient was educated on the following topics: survival skills (signs and symptoms of hyperglycemia and hypoglycemia, treatment for hypoglycemia, ideal levels for fasting and postprandial blood sugars, goal Hgb A1c level, foot care basics), recommendations for physical activity, carbohydrate metabolism in relation to diabetes, and meal planning (sources of carbohydrate, carbohydrate counting, meal planning strategies, food label reading, and portion control).   [name], RD, LDN   

## 2012-04-15 ENCOUNTER — Observation Stay (HOSPITAL_COMMUNITY)
Admission: EM | Admit: 2012-04-15 | Discharge: 2012-04-17 | Disposition: A | Discharge status: Home / Self Care | Attending: Family Medicine | Admitting: Family Medicine

## 2012-04-15 ENCOUNTER — Encounter (HOSPITAL_COMMUNITY): Payer: Self-pay | Admitting: *Deleted

## 2012-04-15 DIAGNOSIS — E119 Type 2 diabetes mellitus without complications: Secondary | ICD-10-CM

## 2012-04-15 DIAGNOSIS — R4182 Altered mental status, unspecified: Secondary | ICD-10-CM | POA: Insufficient documentation

## 2012-04-15 DIAGNOSIS — T5691XA Toxic effect of unspecified metal, accidental (unintentional), initial encounter: Secondary | ICD-10-CM

## 2012-04-15 DIAGNOSIS — I10 Essential (primary) hypertension: Secondary | ICD-10-CM | POA: Diagnosis present

## 2012-04-15 DIAGNOSIS — G929 Unspecified toxic encephalopathy: Secondary | ICD-10-CM

## 2012-04-15 DIAGNOSIS — R45851 Suicidal ideations: Secondary | ICD-10-CM | POA: Insufficient documentation

## 2012-04-15 DIAGNOSIS — F32A Depression, unspecified: Secondary | ICD-10-CM

## 2012-04-15 DIAGNOSIS — G9340 Encephalopathy, unspecified: Secondary | ICD-10-CM | POA: Insufficient documentation

## 2012-04-15 DIAGNOSIS — T438X5A Adverse effect of other psychotropic drugs, initial encounter: Secondary | ICD-10-CM | POA: Insufficient documentation

## 2012-04-15 DIAGNOSIS — T56894A Toxic effect of other metals, undetermined, initial encounter: Secondary | ICD-10-CM

## 2012-04-15 DIAGNOSIS — K589 Irritable bowel syndrome without diarrhea: Secondary | ICD-10-CM | POA: Diagnosis present

## 2012-04-15 DIAGNOSIS — F319 Bipolar disorder, unspecified: Secondary | ICD-10-CM | POA: Diagnosis present

## 2012-04-15 DIAGNOSIS — T50995A Adverse effect of other drugs, medicaments and biological substances, initial encounter: Principal | ICD-10-CM | POA: Insufficient documentation

## 2012-04-15 DIAGNOSIS — T56891A Toxic effect of other metals, accidental (unintentional), initial encounter: Secondary | ICD-10-CM

## 2012-04-15 DIAGNOSIS — F329 Major depressive disorder, single episode, unspecified: Secondary | ICD-10-CM | POA: Diagnosis present

## 2012-04-15 DIAGNOSIS — G92 Toxic encephalopathy: Secondary | ICD-10-CM | POA: Diagnosis present

## 2012-04-15 DIAGNOSIS — E139 Other specified diabetes mellitus without complications: Secondary | ICD-10-CM | POA: Diagnosis present

## 2012-04-15 HISTORY — DX: Major depressive disorder, single episode, unspecified: F32.9

## 2012-04-15 HISTORY — DX: Bipolar disorder, unspecified: F31.9

## 2012-04-15 HISTORY — DX: Irritable bowel syndrome, unspecified: K58.9

## 2012-04-15 HISTORY — DX: Depression, unspecified: F32.A

## 2012-04-15 HISTORY — DX: Essential (primary) hypertension: I10

## 2012-04-15 HISTORY — DX: Type 2 diabetes mellitus without complications: E11.9

## 2012-04-15 LAB — CBC WITH DIFFERENTIAL/PLATELET
Basophils Relative: 0 % (ref 0–1)
Eosinophils Absolute: 0.2 10*3/uL (ref 0.0–0.7)
Eosinophils Relative: 4 % (ref 0–5)
HCT: 35.5 % — ABNORMAL LOW (ref 36.0–46.0)
Hemoglobin: 11.7 g/dL — ABNORMAL LOW (ref 12.0–15.0)
Lymphs Abs: 1.1 10*3/uL (ref 0.7–4.0)
MCH: 28.9 pg (ref 26.0–34.0)
MCHC: 33 g/dL (ref 30.0–36.0)
MCV: 87.7 fL (ref 78.0–100.0)
Monocytes Absolute: 0.6 10*3/uL (ref 0.1–1.0)
Monocytes Relative: 9 % (ref 3–12)
Neutrophils Relative %: 70 % (ref 43–77)

## 2012-04-15 LAB — BASIC METABOLIC PANEL
BUN: 15 mg/dL (ref 6–23)
Calcium: 10.1 mg/dL (ref 8.4–10.5)
Creatinine, Ser: 0.56 mg/dL (ref 0.50–1.10)
GFR calc Af Amer: >90 mL/min (ref >90)
GFR calc non Af Amer: >90 mL/min (ref >90)
Glucose, Bld: 121 mg/dL — ABNORMAL HIGH (ref 70–99)

## 2012-04-15 LAB — URINALYSIS, ROUTINE W REFLEX MICROSCOPIC
Ketones, ur: NEGATIVE mg/dL
Leukocytes, UA: NEGATIVE
Nitrite: NEGATIVE
Specific Gravity, Urine: 1.02 (ref 1.005–1.030)
Urobilinogen, UA: 0.2 mg/dL (ref 0.0–1.0)
pH: 7.5 (ref 5.0–8.0)

## 2012-04-15 LAB — GLUCOSE, CAPILLARY
Glucose-Capillary: 66 mg/dL — ABNORMAL LOW (ref 70–99)
Glucose-Capillary: 75 mg/dL (ref 70–99)
Glucose-Capillary: 130 mg/dL — ABNORMAL HIGH (ref 70–99)

## 2012-04-15 LAB — URINE MICROSCOPIC-ADD ON

## 2012-04-15 LAB — RAPID URINE DRUG SCREEN, HOSP PERFORMED
Barbiturates: NOT DETECTED
Tetrahydrocannabinol: NOT DETECTED

## 2012-04-15 LAB — HEMOGLOBIN A1C
Hgb A1c MFr Bld: 4.8 % (ref <5.7)
Mean Plasma Glucose: 91 mg/dL (ref <117)

## 2012-04-15 LAB — ACETAMINOPHEN LEVEL: Acetaminophen (Tylenol), Serum: <15 ug/mL (ref 10–30)

## 2012-04-15 MED ORDER — PANTOPRAZOLE SODIUM 40 MG PO TBEC
40.0000 mg | DELAYED_RELEASE_TABLET | Freq: Every day | ORAL | Status: DC
  Administered 2012-04-15 – 2012-04-17 (×3): 40 mg via ORAL
  Filled 2012-04-15 (×3): qty 1

## 2012-04-15 MED ORDER — LOSARTAN POTASSIUM 50 MG PO TABS
100.0000 mg | ORAL_TABLET | Freq: Every day | ORAL | Status: DC
  Administered 2012-04-15: 100 mg via ORAL
  Filled 2012-04-15: qty 2

## 2012-04-15 MED ORDER — ACETAMINOPHEN 650 MG RE SUPP: 650.0000 mg | Freq: Four times a day (QID) | RECTAL | Status: DC | PRN

## 2012-04-15 MED ORDER — SODIUM CHLORIDE 0.9 % IV BOLUS (SEPSIS)
1000.0000 mL | Freq: Once | INTRAVENOUS | Status: AC
  Administered 2012-04-15: 1000 mL via INTRAVENOUS

## 2012-04-15 MED ORDER — SODIUM CHLORIDE 0.9 % IV SOLN
INTRAVENOUS | Status: DC
  Administered 2012-04-15 – 2012-04-16 (×4): via INTRAVENOUS

## 2012-04-15 MED ORDER — AMLODIPINE BESYLATE 5 MG PO TABS
5.0000 mg | ORAL_TABLET | Freq: Every day | ORAL | Status: DC
Start: 2012-04-15 — End: 2012-04-17
  Administered 2012-04-15 – 2012-04-17 (×3): 5 mg via ORAL
  Filled 2012-04-15 (×3): qty 1

## 2012-04-15 MED ORDER — GLUCOSE 40 % PO GEL
ORAL | Status: AC
  Administered 2012-04-15: 37.5 g
  Filled 2012-04-15: qty 0.83

## 2012-04-15 MED ORDER — ONDANSETRON HCL 4 MG PO TABS
4.0000 mg | ORAL_TABLET | Freq: Four times a day (QID) | ORAL | Status: DC | PRN
  Administered 2012-04-16: 4 mg via ORAL
  Filled 2012-04-15: qty 1

## 2012-04-15 MED ORDER — ALUM & MAG HYDROXIDE-SIMETH 200-200-20 MG/5ML PO SUSP: 30.0000 mL | Freq: Four times a day (QID) | ORAL | Status: DC | PRN

## 2012-04-15 MED ORDER — ENOXAPARIN SODIUM 40 MG/0.4ML ~~LOC~~ SOLN
40.0000 mg | SUBCUTANEOUS | Status: DC
  Administered 2012-04-15 – 2012-04-17 (×3): 40 mg via SUBCUTANEOUS
  Filled 2012-04-15 (×3): qty 0.4

## 2012-04-15 MED ORDER — INSULIN ASPART 100 UNIT/ML ~~LOC~~ SOLN
0.0000 [IU] | Freq: Three times a day (TID) | SUBCUTANEOUS | Status: DC
  Administered 2012-04-15 – 2012-04-17 (×3): 1 [IU] via SUBCUTANEOUS
  Administered 2012-04-17: 2 [IU] via SUBCUTANEOUS

## 2012-04-15 MED ORDER — SODIUM CHLORIDE 0.9 % IJ SOLN
3.0000 mL | Freq: Two times a day (BID) | INTRAMUSCULAR | Status: DC
  Administered 2012-04-15 – 2012-04-17 (×4): 3 mL via INTRAVENOUS

## 2012-04-15 MED ORDER — ONDANSETRON HCL 4 MG/2ML IJ SOLN
4.0000 mg | Freq: Four times a day (QID) | INTRAMUSCULAR | Status: DC | PRN
  Administered 2012-04-17 (×2): 4 mg via INTRAVENOUS
  Filled 2012-04-15 (×2): qty 2

## 2012-04-15 MED ORDER — ACETAMINOPHEN 325 MG PO TABS
650.0000 mg | ORAL_TABLET | Freq: Four times a day (QID) | ORAL | Status: DC | PRN
  Administered 2012-04-15: 650 mg via ORAL
  Filled 2012-04-15: qty 2

## 2012-04-15 NOTE — ED Notes (Signed)
Chaplain at bedside

## 2012-04-15 NOTE — ED Notes (Signed)
Attempted to call report. RN, Val, to call back.

## 2012-04-15 NOTE — H&P (Signed)
[facility] History and Physical  [name] WGN:[id number] DOB: 11-18-1955 DOA: 04/15/2012  Referring physician:  PCP: Colette Ribas, MD  Specialists:   Chief Complaint: altered mental status  HPI: [name] is a 57 y.o. female with past medical hx IBS, HTN, depression, bipolar, DM who presents to ED from PCP with cc altered mental status. Information obtained from family, pt and chart as info from pt not reliable. Pt reports not feeling well this am upon awakening. States she felt dizzy and confused and irritable. She states she "took my diabetes medicine and ate a bite". Husband reports pt expressing SI as well as auditory hallucinations. He states that this am she was confused, unsteady on her feet so he took her to PCP who did labs that indicated lithium level of 2.16. Pt denies pain/discomfort, nausea, sob, headache, visual disturbances. She denies recent illness or sick contacts. Of note, husband reports pt stopped taking Latuda 3 weeks ago due diabetes medicine interaction. In addition, she had been on prozac which was discontinued 1 week ago. Symptoms came on suddenly have persisted and characterized as severe. [facility] asked to admit.    Review of Systems: The patient denies anorexia, fever, weight loss,, vision loss, decreased hearing, hoarseness, chest pain, syncope, dyspnea on exertion, peripheral edema, balance deficits, hemoptysis, abdominal pain, melena, hematochezia, severe indigestion/heartburn, hematuria, incontinence, genital sores, muscle weakness, suspicious skin lesions, transient blindness, difficulty walking, unusual weight change, abnormal bleeding, enlarged lymph nodes, angioedema, and breast masses.    Past Medical History  Diagnosis Date  . Diabetes mellitus without complication   . Depression   . IBS (irritable bowel syndrome)   . HTN (hypertension)   . Bipolar 1 disorder    Past Surgical History  Procedure Laterality Date  . Cholecystectomy      Social History:  reports that she has never smoked. She does not have any smokeless tobacco history on file. She reports that she does not drink alcohol. Her drug history is not on file. No Known Allergies Lives with husband. Unemployed. Independent with ADL's  No family history on file. mother alive at 62 with HTN CAD. Father deceased stomach cancer  Prior to Admission medications   Medication Sig Start Date End Date Taking? Authorizing Provider  amLODipine (NORVASC) 5 MG tablet Take 5 mg by mouth daily.   Yes Historical Provider, MD  insulin aspart (NOVOLOG) 100 UNIT/ML injection Inject 5 Units into the skin 3 (three) times daily before meals. Sliding scale   Yes Historical Provider, MD  losartan (COZAAR) 100 MG tablet Take 100 mg by mouth daily.   Yes Historical Provider, MD  lurasidone (LATUDA) 80 MG TABS Take 80 mg by mouth daily with breakfast.   Yes Historical Provider, MD  omeprazole (PRILOSEC) 40 MG capsule Take 40 mg by mouth daily.   Yes Historical Provider, MD  traMADol (ULTRAM) 50 MG tablet Take 50 mg by mouth 3 (three) times daily as needed (headache).   Yes Historical Provider, MD   Physical Exam: Filed Vitals:   04/15/12 0848 04/15/12 0906  BP: 127/73   Pulse: 91   Temp:  99.8 F (37.7 C)  TempSrc:  Rectal  Resp: 20   SpO2: 97%      General:  Lethargic, well nourished NAD  Eyes: PERRL EOMI   ENT: ears clear nose without drainage, mucus membranes mouth pink/dry/poor dentitin  Neck: supple no JVD no lymphadenopathy  Cardiovascular: RRR No MGR No LE edema PPP  Respiratory: normal effort  BS clear bilaterally no wheeze no rhonchi  Abdomen: round soft +BS non-tender to palpation  Skin: warm dry no rash or lesions  Musculoskeletal: moves all extremities. No joint swelling/erythema. Non-tender  Psychiatric: denies SI. Denies auditory hallucinations. Irritable, cooperative  Neurologic: speech slow but clear, facial symmetry. Cranial nerve Ii-XII intact.    Labs on Admission:  Basic Metabolic Panel:  Recent Labs Lab 04/15/12 0844  NA 139  K 3.6  CL 106  CO2 25  GLUCOSE 121*  BUN 15  CREATININE 0.56  CALCIUM 10.1   Liver Function Tests: No results found for this basename: AST, ALT, ALKPHOS, BILITOT, PROT, ALBUMIN,  in the last 168 hours No results found for this basename: LIPASE, AMYLASE,  in the last 168 hours No results found for this basename: AMMONIA,  in the last 168 hours CBC:  Recent Labs Lab 04/15/12 0844  WBC 6.3  NEUTROABS 4.4  HGB 11.7*  HCT 35.5*  MCV 87.7  PLT 178   Cardiac Enzymes: No results found for this basename: CKTOTAL, CKMB, CKMBINDEX, TROPONINI,  in the last 168 hours  BNP (last 3 results) No results found for this basename: PROBNP,  in the last 8760 hours CBG:  Recent Labs Lab 04/15/12 1147  GLUCAP 66*    Radiological Exams on Admission: No results found.  EKG: Independently reviewed.   Assessment/Plan Principal Problem:   Encephalopathy, toxic: may be multifactorial i.e. Lithium toxicity, other medications such as benzo's, hypoglycemia, dehydration. Will admit to tele. Will aggressively hydrate for lithium toxicity and dehydration. Will hold medications except anti-hypertensive meds for now.  Active Problems:   Lithium toxicity: reportedly stopped lithium 3 weeks ago. Will aggressively hydrate and monitor level closely. Hold lithium. Will request Psych consult for medical management and recommendations on disposition.     Diabetes mellitus without complication: CBG 66 on admission. Given glucogen. Will use SSI for glycemic control. Carb modified diet.     Depression: See #1.     IBS (irritable bowel syndrome): stable at baseline. Last BM yesterday    HTN (hypertension): controlled. Continue amlodipine    Bipolar 1 disorder: see #1. Will request psy consult. Hold meds for now.   Psych consult  Code Status: full Family Communication:  Disposition Plan: home when ready Time  spent: 65 minutes  [facility]   If 7PM-7AM, please contact night-coverage [email or web address] Password [facility] 04/15/2012, 12:40 PM   Attending note:  Patient independently seen and examined. Above note reviewed. Patient remains confused, but not as lethargic since arrival to ED. Her lithium level is mildly elevated and her tox screen is positive for benzodiazepines.  Review of home meds and meds received in ED do not indicate any benzos.  Urinalysis is unremarkable.  Will check ammonia. Her confusion is likely medication related.  She will need a psychiatry consultation to further adjust her medications. ER records also indicate that patient was having suicidal ideations, although she is denying them at present.  Would want psychiatry input to help determine further disposition from hospital. Patient will be observed overnight.  I anticipate that she will be medically cleared by tomorrow.  MEMON,JEHANZEB

## 2012-04-15 NOTE — ED Notes (Signed)
Pt presents to er with spouse for evaluation of medical clearance, spouse states pt was taken prozac about 3 weeks ago due to problems with liver,  was placed on Latuda instead and pt stopped taking that a week ago and did not tell anyone, pt arrives to er, crying, can be consoled, agitated at times, spouse reports that pt keeps telling him that she wants to kill herself, primary psych Dr. Is Dr. Tomasa Rand in [place]. When asked pt admits to SI at times,

## 2012-04-15 NOTE — ED Notes (Signed)
Husband at bedside, very appropriate and supportive of patient. When husband leaves bedside, patient cries stating "he's leaving me. He's always going to the store or somewhere and I know he's just never coming back.

## 2012-04-15 NOTE — ED Notes (Addendum)
Patient's family member request chaplain. Will contact chaplain for him.

## 2012-04-15 NOTE — ED Notes (Signed)
Report given to Val, RN unit 200. Ready to receive patient to 225.

## 2012-04-15 NOTE — ED Provider Notes (Signed)
History    This chart was scribed for Benny Lennert, MD by Quintella Reichert, ED scribe.  This patient was seen in room APA16A/[facility]16A and the patient's care was started at 9:06 AM.   CSN: [id number]  Arrival date & time 04/15/12  4098       Chief Complaint  Patient presents with  . V70.1     Patient is a 57 y.o. female presenting with altered mental status. The history is provided by the patient and the spouse. No language interpreter was used.  Altered Mental Status This is a recurrent problem. The current episode started more than 1 week ago. The problem occurs constantly. The problem has been gradually worsening. Pertinent negatives include no chest pain, no abdominal pain, no headaches and no shortness of breath. Nothing aggravates the symptoms. The symptoms are relieved by medications. The treatment provided significant relief.   [name] is a 57 y.o. female brought to the Emergency Department by her husband, sent from Dr. Sherwood Gambler (PCP), who claims she has expressed SI, accompanied by auditory hallucinations.  Pt denies SI and hallucinations but expresses agitation and depression.  Husband states pt has Bipolar Depression and 6 months ago began experiencing hallucinations and depression.  Husband reports pt successfully treated these symptoms with Latuda, but stopped taking Latuda 3 weeks ago to avoid interaction with her DM medication.  She was also taken off of Prozac 1 month ago due to liver problems.  In last 2 weeks husband states she began experiencing symptoms, and became unable to medicate herself.   Past Medical History  Diagnosis Date  . Diabetes mellitus without complication   . Depression   . IBS (irritable bowel syndrome)     Past Surgical History  Procedure Laterality Date  . Cholecystectomy      No family history on file.  History  Substance Use Topics  . Smoking status: Never Smoker   . Smokeless tobacco: Not on file  . Alcohol Use: No    OB  History   Grav Para Term Preterm Abortions TAB SAB Ect Mult Living                  Review of Systems  Constitutional: Negative for appetite change and fatigue.  HENT: Negative for congestion, sinus pressure and ear discharge.   Eyes: Negative for discharge.  Respiratory: Negative for cough and shortness of breath.   Cardiovascular: Negative for chest pain.  Gastrointestinal: Negative for abdominal pain and diarrhea.  Genitourinary: Negative for frequency and hematuria.  Musculoskeletal: Negative for back pain.  Skin: Negative for rash.  Neurological: Negative for seizures and headaches.  Psychiatric/Behavioral: Positive for suicidal ideas (Pt's husband claims, but pt denies), hallucinations (Auditory), dysphoric mood, agitation and altered mental status.    Allergies  Review of patient's allergies indicates no known allergies.  Home Medications  No current outpatient prescriptions on file.  BP 127/73  Pulse 91  Resp 20  SpO2 97%  Physical Exam  Nursing note and vitals reviewed. Constitutional: She appears well-developed.  HENT:  Head: Normocephalic.  Eyes: EOM are normal. No scleral icterus.  Conjunctivitis in right eye   Neck: Neck supple. No thyromegaly present.  Cardiovascular: Normal rate and regular rhythm.  Exam reveals no gallop and no friction rub.   No murmur heard. Pulmonary/Chest: No stridor. She has no wheezes. She has no rales. She exhibits no tenderness.  Abdominal: She exhibits no distension. There is no tenderness. There is no rebound.  Musculoskeletal: Normal  range of motion. She exhibits no edema.  Lymphadenopathy:    She has no cervical adenopathy.  Neurological: She is alert. Coordination normal.  Oriented to person and place, but not time  Skin: No rash noted. No erythema.  Psychiatric:  Depressed, pt denies SI    ED Course  Procedures (including critical care time)  DIAGNOSTIC STUDIES: Oxygen Saturation is 97% on room air, normal by my  interpretation.    COORDINATION OF CARE: 9:14 AM-Discussed treatment plan which includes bloodwork and f/u with psychiatrist with pt's husband and he agreed to plan.  10:07 AM: On recheck, explained that symptoms are most likely due to lithium elevation.  Pt states she increased lithium in accordance with psychiatrist's recommendations.  Recommended admission to hospital to correct lithium level.  Labs Reviewed  CBC WITH DIFFERENTIAL - Abnormal; Notable for the following:    Hemoglobin 11.7 (*)    HCT 35.5 (*)    All other components within normal limits  BASIC METABOLIC PANEL - Abnormal; Notable for the following:    Glucose, Bld 121 (*)    All other components within normal limits  URINE RAPID DRUG SCREEN (HOSP PERFORMED) - Abnormal; Notable for the following:    Benzodiazepines POSITIVE (*)    All other components within normal limits  URINALYSIS, ROUTINE W REFLEX MICROSCOPIC - Abnormal; Notable for the following:    Hgb urine dipstick TRACE (*)    All other components within normal limits  LITHIUM LEVEL - Abnormal; Notable for the following:    Lithium Lvl 2.16 (*)    All other components within normal limits  ETHANOL  URINE MICROSCOPIC-ADD ON   No results found.   No diagnosis found.    MDM        The chart was scribed for me under my direct supervision.  I personally performed the history, physical, and medical decision making and all procedures in the evaluation of this patient.Benny Lennert, MD 04/15/12 [phone]

## 2012-04-15 NOTE — ED Notes (Signed)
CRITICAL VALUE ALERT  Critical value received:  Lithium 2.16  Date of notification:  04/15/12  Time of notification:  0952  Critical value read back:yes  Nurse who received alert:  c Traven Davids rn  MD notified (1st page):  Dr zammit  Time of first page:  [phone]  MD notified (2nd page):  Time of second page:  Responding MD:  Dr Estell Harpin  Time MD responded:  [phone]

## 2012-04-16 DIAGNOSIS — F329 Major depressive disorder, single episode, unspecified: Secondary | ICD-10-CM

## 2012-04-16 DIAGNOSIS — F3289 Other specified depressive episodes: Secondary | ICD-10-CM

## 2012-04-16 LAB — BASIC METABOLIC PANEL
BUN: 8 mg/dL (ref 6–23)
Creatinine, Ser: 0.72 mg/dL (ref 0.50–1.10)
GFR calc Af Amer: >90 mL/min (ref >90)
GFR calc non Af Amer: >90 mL/min (ref >90)
Potassium: 3.5 mEq/L (ref 3.5–5.1)

## 2012-04-16 LAB — LITHIUM LEVEL: Lithium Lvl: 0.91 mEq/L (ref 0.80–1.40)

## 2012-04-16 LAB — GLUCOSE, CAPILLARY
Glucose-Capillary: 107 mg/dL — ABNORMAL HIGH (ref 70–99)
Glucose-Capillary: 114 mg/dL — ABNORMAL HIGH (ref 70–99)

## 2012-04-16 LAB — TSH: TSH: 2.038 u[IU]/mL (ref 0.350–4.500)

## 2012-04-16 MED ORDER — ALPRAZOLAM 0.5 MG PO TABS
0.5000 mg | ORAL_TABLET | Freq: Every evening | ORAL | Status: DC | PRN
  Administered 2012-04-16: 0.5 mg via ORAL
  Filled 2012-04-16: qty 1

## 2012-04-16 NOTE — Progress Notes (Signed)
Patient seen, independently examined and chart reviewed. I agree with exam, assessment and plan discussed with Toya Smothers, NP.  Subjective: Very frustrated, denies suicidal ideation/homicidal ideation. Denies hallucinations. Believes she has been in the hospital for several days. Very frustrated with her husband who she reports has been "telling stories". She admits to being on lithium but later denied it.  Objective: Afebrile, vital signs stable.  Labs: Lithium level now and 0.91. Basic metabolic panel unremarkable.  Acute issues:  Lithium toxicity: Resolved. Not clear whether patient took extra medication or not. She is chronically on lithium on chart review per history obtained from by another physician.  Reported suicidal ideation, hallucinations: The patient denies this however her history is questionable.  Acute encephalopathy: likely secondary to lithium toxicity. Hypoglycemia or benzodiazepines have contributed.   Hypoglycemia: resolved. Etiology unclear. Question oral intake or insulin use.   Depression, bipolar  Diabetes mellitus: Stable.   Urine drug screen positive for benzodiazepines--not on benzodiazepines at home?   Plan:  ACT team evaluation. Patient was cleared by psychiatry last night but she is clearly still confused and agitated and appears to be expressing paranoid ideation in regard to her husband.  Summary: 57 year old woman with history of depression and bipolar disorder who was sent to the emergency department by her primary care physician for altered mental status. Has been reported patient was expressing suicidal ideation as well as auditory hallucinations., Also noted to be confused by report. Primary care physician's office lithium level was noted to be 2.16. Of note, husband reports pt stopped taking Latuda 3 weeks ago due diabetes medicine interaction. In addition, she had been on prozac which was discontinued 1 week ago. She was admitted for toxic  encephalopathy.   Brendia Sacks, MD [facility] [phone]

## 2012-04-16 NOTE — BH Assessment (Signed)
Assessment Note   [name] is an 58 y.o. female. The patient was brought to the ED after continued reports of feeling bad and questions regarding her health. While in the ED it was found that her Lithium levels were elevated.At first it was thought that the patient had taken an overdose of the Lithium. She was admitted to the floor for observation. It was found that she was taking her medications as prescribed. After a tele-psych was completed on 04/15/2012, the psychiatrist recommendations were for the patient to resume outpatient care. He did not feel that there was any suicidal issues. Dr Irene Limbo saw the patient today and had questions regarding the patients paranoia and restlessness. After speaking with the patient she is restless and agitated. She is afraid that her spouse is not coming to see her. He is at work in the [place] [employer] [place] and she does not have a cell number to reach him. She appears frightened rather than paranoid. She does seem some what manic at this time though. Discussed with Dr Morley Kos. He wants to speak with the husband before discharging the patient. Made the floor secretary and the patient's nurse aware that Dr Irene Limbo did want to speak with husband .  Axis I: Bipolar, mixed Axis II: Deferred Axis III:  Past Medical History  Diagnosis Date  . Diabetes mellitus without complication   . Depression   . IBS (irritable bowel syndrome)   . HTN (hypertension)   . Bipolar 1 disorder    Axis IV: problems with primary support group Axis V: 41-50 serious symptoms  Past Medical History:  Past Medical History  Diagnosis Date  . Diabetes mellitus without complication   . Depression   . IBS (irritable bowel syndrome)   . HTN (hypertension)   . Bipolar 1 disorder     Past Surgical History  Procedure Laterality Date  . Cholecystectomy      Family History: History reviewed. No pertinent family history.  Social History:  reports that she has never smoked. She does not  have any smokeless tobacco history on file. She reports that she does not drink alcohol. Her drug history is not on file.  Additional Social History:     CIWA: CIWA-Ar BP: 121/69 mmHg Pulse Rate: 72 COWS:    Allergies: No Known Allergies  Home Medications:  Medications Prior to Admission  Medication Sig Dispense Refill  . amLODipine (NORVASC) 5 MG tablet Take 5 mg by mouth daily.      . insulin aspart (NOVOLOG) 100 UNIT/ML injection Inject 5 Units into the skin 3 (three) times daily before meals. Sliding scale      . losartan (COZAAR) 100 MG tablet Take 100 mg by mouth daily.      Marland Kitchen lurasidone (LATUDA) 80 MG TABS Take 80 mg by mouth daily with breakfast.      . omeprazole (PRILOSEC) 40 MG capsule Take 40 mg by mouth daily.      . traMADol (ULTRAM) 50 MG tablet Take 50 mg by mouth 3 (three) times daily as needed (headache).        OB/GYN Status:  No LMP recorded. Patient has had a hysterectomy.  General Assessment Data Location of Assessment: [facility] ED (2 A Nursing Station/Room 225) ACT Assessment: Yes Living Arrangements: Spouse/significant other Can pt return to current living arrangement?: Yes Admission Status: Voluntary Is patient capable of signing voluntary admission?: Yes Transfer from: Acute Hospital Referral Source: Medical Floor Inpatient  Education Status Is patient currently in school?:  No  Risk to self Suicidal Ideation: No Suicidal Intent: No Is patient at risk for suicide?: No Suicidal Plan?: No Access to Means: No What has been your use of drugs/alcohol within the last 12 months?: none Previous Attempts/Gestures: No How many times?: 0 Other Self Harm Risks: none Triggers for Past Attempts: None known Intentional Self Injurious Behavior: None Family Suicide History: No Recent stressful life event(s): Recent negative physical changes;Other (Comment) (evelated Lithium level) Persecutory voices/beliefs?: No Depression: Yes Depression Symptoms: Feeling  worthless/self pity;Guilt;Isolating;Tearfulness Substance abuse history and/or treatment for substance abuse?: No Suicide prevention information given to non-admitted patients: Yes  Risk to Others Homicidal Ideation: No Thoughts of Harm to Others: No Current Homicidal Intent: No Current Homicidal Plan: No Access to Homicidal Means: No History of harm to others?: No Assessment of Violence: None Noted Does patient have access to weapons?: No Criminal Charges Pending?: No Does patient have a court date: No  Psychosis Hallucinations: None noted Delusions: None noted  Mental Status Report Appear/Hygiene: Disheveled Eye Contact: Fair Motor Activity: Agitation;Restlessness Speech: Rapid Level of Consciousness: Alert;Restless Mood: Anxious;Sad;Despair Affect: Anxious;Sad Anxiety Level: Moderate Thought Processes: Coherent Judgement: Unimpaired Orientation: Person;Place;Time Obsessive Compulsive Thoughts/Behaviors: Moderate  Cognitive Functioning Concentration: Decreased Memory: Recent Intact;Remote Intact IQ: Average Insight: Fair Impulse Control: Poor Appetite: Fair Sleep: No Change Vegetative Symptoms: None  ADLScreening [facility] Assessment Services) Patient's cognitive ability adequate to safely complete daily activities?: Yes Patient able to express need for assistance with ADLs?: Yes Independently performs ADLs?: Yes (appropriate for developmental age)  Abuse/Neglect [facility]) Physical Abuse: Denies Verbal Abuse: Denies Sexual Abuse: Denies  Prior Inpatient Therapy Prior Inpatient Therapy: No  Prior Outpatient Therapy Prior Outpatient Therapy: Yes Prior Therapy Dates: current Prior Therapy Facilty/Provider(s): Dr Roel Cluck, [place] Reason for Treatment: Bipolar Disorder;Medications  ADL Screening (condition at time of admission) Patient's cognitive ability adequate to safely complete daily activities?: Yes Patient able to express need for assistance with  ADLs?: Yes Independently performs ADLs?: Yes (appropriate for developmental age) Weakness of Legs: None Weakness of Arms/Hands: None  Home Assistive Devices/Equipment Home Assistive Devices/Equipment: None  Therapy Consults (therapy consults require a physician order) PT Evaluation Needed: No OT Evalulation Needed: No SLP Evaluation Needed: No Abuse/Neglect Assessment (Assessment to be complete while patient is alone) Physical Abuse: Denies Verbal Abuse: Denies Sexual Abuse: Denies Exploitation of patient/patient's resources: Denies Self-Neglect: Denies Values / Beliefs Cultural Requests During Hospitalization: None Spiritual Requests During Hospitalization: None Consults Spiritual Care Consult Needed: No Social Work Consult Needed: No Merchant [employer] officer (For Healthcare) Advance Directive: Patient does not have advance directive;Patient would like information Pre-existing out of facility DNR order (yellow form or pink MOST form): No Nutrition Screen- [facility] Adult/[facility] Patient's home diet: Carb modified Have you recently lost weight without trying?: No Have you been eating poorly because of a decreased appetite?: No Malnutrition Screening Tool Score: 0  Additional Information 1:1 In Past 12 Months?: No CIRT Risk: No Elopement Risk: No Does patient have medical clearance?: No     Disposition: DR GOODRICH WILL SPEAK WITH THE PATIENT'S HUSBAND BEFORE DISCHARGE. WHEN PATIENT IS DISCHARGED, SHE WILL BE REFERRED TO HER CURRENT PSYCHIATRIST DR Tomasa Rand IN  FOR FOLLOW UP AND MEDICATION ADJUSTMENTS. Disposition Initial Assessment Completed for this Encounter: Yes Disposition of Patient: Outpatient treatment;Referred to Type of outpatient treatment: Adult Patient referred to: Other (Comment) (current psychiatrist, Dr Roel Cluck [place])  On Site Evaluation by:   Reviewed with Physician:     Jearld Pies 04/16/2012 4:16 PM

## 2012-04-16 NOTE — Progress Notes (Signed)
UR Chart Review Completed  

## 2012-04-16 NOTE — Progress Notes (Signed)
[facility] PROGRESS NOTE  [name] WJX:[id number] DOB: Jan 20, 1955 DOA: 04/15/2012 PCP: Colette Ribas, MD  Assessment/Plan: Encephalopathy, toxic: may be multifactorial i.e. Lithium toxicity, other medications such as benzo's, hypoglycemia, dehydration. Improved this am. Lithium level WNL glucose controlled, IV fluids. Will reduce rate of IV fluids. Monitor  Active Problems:  Lithium toxicity: Husband confirms pt taking lithium and has had episode of toxicity in past. Level WNL this am. Continue to hold. Await Psych consult for medical management and recommendations on disposition.   Diabetes mellitus without complication: CBG 66 on admission. Now controlled.  Will continue to use SSI for glycemic control. Carb modified diet. Po intake remains poor  Depression: See #1.   IBS (irritable bowel syndrome): stable at baseline.   HTN (hypertension): controlled. Continue amlodipine   Bipolar 1 disorder: see #1. Will request psy consult. Agitated and paranoid this am. Oriented to place.    Code Status: full Family Communication:  Disposition Plan: may need IP BH before going home. Medical issues stable today. Await Psy consult for recommendations   Consultants:  Tele psy  Procedures:  none  Antibiotics:  none  HPI/Subjective: Awake alert. Complains nausea. Denies pain. Somewhat agitated.  Objective: Filed Vitals:   04/15/12 1452 04/15/12 1838 04/15/12 2146 04/16/12 0538  BP: 129/83  105/66 117/78  Pulse: 87  71 58  Temp: 98.6 F (37 C)  97.2 F (36.2 C) 98.1 F (36.7 C)  TempSrc: Oral  Oral Oral  Resp: 20  18 20   Height:  5\' 4"  (1.626 m)    Weight:  64.1 kg (141 lb 5 oz)    SpO2: 96%  98% 97%    Intake/Output Summary (Last 24 hours) at 04/16/12 0931 Last data filed at 04/16/12 0500  Gross per 24 hour  Intake    240 ml  Output   1951 ml  Net  -1711 ml   Filed Weights   04/15/12 1838  Weight: 64.1 kg (141 lb 5 oz)    Exam:   General:  Alert  agitated   Cardiovascular: RRR No MGR no LE edema  Respiratory: normal effort BS clear bilaterally no wheeze  Abdomen: soft +BS non-tender to palpation  Musculoskeletal: no clubbing no cyanosis   Data Reviewed: Basic Metabolic Panel:  Recent Labs Lab 04/15/12 0844 04/16/12 0504  NA 139 143  K 3.6 3.5  CL 106 110  CO2 25 25  GLUCOSE 121* 169*  BUN 15 8  CREATININE 0.56 0.72  CALCIUM 10.1 8.9   Liver Function Tests: No results found for this basename: AST, ALT, ALKPHOS, BILITOT, PROT, ALBUMIN,  in the last 168 hours No results found for this basename: LIPASE, AMYLASE,  in the last 168 hours No results found for this basename: AMMONIA,  in the last 168 hours CBC:  Recent Labs Lab 04/15/12 0844  WBC 6.3  NEUTROABS 4.4  HGB 11.7*  HCT 35.5*  MCV 87.7  PLT 178   Cardiac Enzymes: No results found for this basename: CKTOTAL, CKMB, CKMBINDEX, TROPONINI,  in the last 168 hours BNP (last 3 results) No results found for this basename: PROBNP,  in the last 8760 hours CBG:  Recent Labs Lab 04/15/12 1147 04/15/12 1235 04/15/12 1730 04/15/12 2111  GLUCAP 66* 75 130* 114*    No results found for this or any previous visit (from the past 240 hour(s)).   Studies: No results found.  Scheduled Meds: . amLODipine  5 mg Oral Daily  . enoxaparin (LOVENOX) injection  40 mg Subcutaneous Q24H  . insulin aspart  0-9 Units Subcutaneous TID WC  . pantoprazole  40 mg Oral Daily  . sodium chloride  3 mL Intravenous Q12H   Continuous Infusions: . sodium chloride 75 mL/hr at 04/16/12 1191    Principal Problem:   Encephalopathy, toxic Active Problems:   Lithium toxicity   Diabetes mellitus without complication   Depression   IBS (irritable bowel syndrome)   HTN (hypertension)   Bipolar 1 disorder   Suicide ideation    Time spent: 30 minutes    [facility] M  [facility]  If 7PM-7AM, please contact night-coverage at [email or web address], password  [facility] 04/16/2012, 9:31 AM  LOS: 1 day

## 2012-04-17 MED ORDER — ONDANSETRON HCL 4 MG PO TABS: 4.0000 mg | ORAL_TABLET | Freq: Three times a day (TID) | ORAL | Status: DC | PRN

## 2012-04-17 MED ORDER — ALPRAZOLAM 0.5 MG PO TABS
0.5000 mg | ORAL_TABLET | Freq: Three times a day (TID) | ORAL | Status: DC | PRN
  Administered 2012-04-17: 0.5 mg via ORAL
  Filled 2012-04-17: qty 1

## 2012-04-17 NOTE — Progress Notes (Signed)
IV removed, site WNL.  Pt given d/c instructions and new prescription.  Pt states she no longer will take the lithium. Pt's husband is with pt during d/c instructions.  Pt states she is not suicidal and has no suicidal ideations at this time, insturcted pt to return to ED or call Dr. Tomasa Rand if suicidal thoughts start.  Discussed home care with patient and discussed home medications, patient verbalizes understanding, teachback completed. F/U appointment in place with Dr. Tomasa Rand on 04/22/12 at 1:30, pt and her husband states they will keep appointment. Pt is stable at this time. Pt taken to main entrance in wheelchair by staff member.

## 2012-04-17 NOTE — Progress Notes (Signed)
[facility] PROGRESS NOTE  JUDYE LORINO ZOX:[id number] DOB: 1955/09/19 DOA: 04/15/2012 PCP: Colette Ribas, MD  Assessment/Plan: 1. Lithium toxicity with acute encephalopathy: Resolved with resolution of toxicity. Patient was on lithium at home although was not listed as a home medication. Encephalopathy may been complicated by hypoglycemia. 2. Diabetes mellitus with hypoglycemia: Hypoglycemia resolved. Otherwise stable. Resume home dosing of insulin. 3. Bipolar disorder, depression: Stable. Cleared by telepsychiatry and by ACT team (discussed with yesterday). Denies suicidal or homicidal ideation. No hallucinations. Of note the patient had already stopped her Latuda and will not take it.   Patient continues to do well with no suicidal, homicidal ideation or hallucinations. Plan discharge home.  Discussed with husband at bedside with patient yesterday evening. He reports patient back to baseline and he would like to take her home 4/17. He feels confident her symptoms were secondary to lithium toxicity and that there was no intent to harm herself. He reports she has been on lithium for many years. He saw similar presentation when she had lithium toxicity approximately 2 years ago. He feels strongly that the patient should come off lithium and plans on contacting the psychiatrist's office today for further recommendations and follow. He is very active in her care and a good advocate for her in the healthcare system. He will take full control of her medications.  Code Status: Full code Family Communication: As above Disposition Plan: Home  Brendia Sacks, MD  [facility]  Pager [phone] If 7PM-7AM, please contact night-coverage at [email or web address], password [facility] 04/17/2012, 12:12 PM  LOS: 2 days   Brief narrative: 57 year old woman with history of depression and bipolar disorder who was sent to the emergency department by her primary care physician for altered mental status. Has  been reported patient was expressing suicidal ideation as well as auditory hallucinations., Also noted to be confused by report. Primary care physician's office lithium level was noted to be 2.16. Of note, husband reports pt stopped taking Latuda 3 weeks ago due diabetes medicine interaction. In addition, she had been on prozac which was discontinued 1 week ago. She was admitted for toxic encephalopathy.  Consultants:  Telepsychiatry: May be discharged, followup with private psychiatrist as an outpatient as soon as possible  ACT team  HPI/Subjective: Feels much better. Some anxiety. Some nausea which is a chronic problem. Ready go home. Denies suicidal ideation or intent to harm. No hallucinations.  Objective: Filed Vitals:   04/16/12 0538 04/16/12 1431 04/16/12 2106 04/17/12 0606  BP: 117/78 121/69 145/82 118/73  Pulse: 58 72 82 71  Temp: 98.1 F (36.7 C) 98.5 F (36.9 C) 98.2 F (36.8 C) 98.2 F (36.8 C)  TempSrc: Oral Oral Oral Oral  Resp: 20 20 20 16   Height:      Weight:      SpO2: 97% 97% 98% 99%    Intake/Output Summary (Last 24 hours) at 04/17/12 1212 Last data filed at 04/17/12 0900  Gross per 24 hour  Intake    543 ml  Output   2400 ml  Net  -1857 ml   Filed Weights   04/15/12 1838  Weight: 64.1 kg (141 lb 5 oz)    Exam:  General:  Appears calm and comfortable, ambulating the hallway without difficulty. Telemetry: SR, no arrhythmias  Psychiatric: grossly normal mood and affect, speech fluent and appropriate Neurologic: grossly non-focal.  Data Reviewed: Basic Metabolic Panel:  Recent Labs Lab 04/15/12 0844 04/16/12 0504  NA 139 143  K 3.6 3.5  CL 106 110  CO2 25 25  GLUCOSE 121* 169*  BUN 15 8  CREATININE 0.56 0.72  CALCIUM 10.1 8.9   CBC:  Recent Labs Lab 04/15/12 0844  WBC 6.3  NEUTROABS 4.4  HGB 11.7*  HCT 35.5*  MCV 87.7  PLT 178   CBG:  Recent Labs Lab 04/16/12 1153 04/16/12 1703 04/16/12 2102 04/17/12 0754  04/17/12 1158  GLUCAP 127* 114* 173* 132* 168*    Scheduled Meds: . amLODipine  5 mg Oral Daily  . enoxaparin (LOVENOX) injection  40 mg Subcutaneous Q24H  . insulin aspart  0-9 Units Subcutaneous TID WC  . pantoprazole  40 mg Oral Daily  . sodium chloride  3 mL Intravenous Q12H   Continuous Infusions:   Principal Problem:   Encephalopathy, toxic Active Problems:   Lithium toxicity   Diabetes mellitus without complication   Depression   IBS (irritable bowel syndrome)   HTN (hypertension)   Bipolar 1 disorder   Suicide ideation     Brendia Sacks, MD  [facility] Pager [phone] If 7PM-7AM, please contact night-coverage at [email or web address], password [facility] 04/17/2012, 12:12 PM  LOS: 2 days

## 2012-04-17 NOTE — Discharge Summary (Addendum)
Physician Discharge Summary  [name]:[id number] DOB: [birth year] DOA: 04/15/2012  PCP: Colette Ribas, MD Psychiatrist: Dr. Tomasa Rand.  Admit date: 04/15/2012 Discharge date: 04/17/2012  Recommendations for Outpatient Follow-up:  1. Followup bipolar disorder, depression. Note the patient self-discontinued Latuda and no longer wants to take Lithium.   Follow-up Information   Schedule an appointment as soon as possible for a visit with Sabrina Sinner, MD.   Contact information:   [address] [place] [address] [phone]      Discharge Diagnoses:  1. Lithium toxicity 2. Acute encephalopathy 3. Diabetes mellitus with hypoglycemia 4. Bipolar disorder, depression  Discharge Condition: Improved Disposition: Return home with husband  Diet recommendation: Diabetic diet  Filed Weights   04/15/12 1838  Weight: 64.1 kg (141 lb 5 oz)    History of present illness:  57 year old woman with history of depression and bipolar disorder who was sent to the emergency department by her primary care physician for altered mental status. Has been reported patient was expressing suicidal ideation as well as auditory hallucinations., Also noted to be confused by report. Primary care physician's office lithium level was noted to be 2.16. Of note, husband reports pt stopped taking Latuda 3 weeks ago due diabetes medicine interaction. In addition, she had been on prozac which was discontinued 1 week ago. She was admitted for toxic encephalopathy.  Hospital Course:  [name] was admitted for further evaluation of acute encephalopathy and treatment for lithium toxicity. Acute encephalopathy resolved with resolution of lithium toxicity, IV fluids and supportive care. Lithium was withheld. She had had a previous dose increase this may be the etiology of the toxicity. She also had hypoglycemia on admission which resolved with supportive care. Although the chart notes a  report of suicidal ideation patient adamantly denied this, denied homicidal ideation and denied hallucinations. She was cleared by telepsych and ACT team.  1. Lithium toxicity with acute encephalopathy: Resolved with resolution of toxicity. Patient was on lithium at home although was not listed as a home medication. Encephalopathy may have been complicated by hypoglycemia. 2. Diabetes mellitus with hypoglycemia: Hypoglycemia resolved. Otherwise stable. Resume home dosing of insulin. 3. Bipolar disorder, depression: Stable. Cleared by telepsychiatry and by ACT team (discussed with yesterday). Denies suicidal or homicidal ideation. No hallucinations. Of note the patient had already stopped her Latuda and will not take it.  Patient continues to do well with no suicidal, homicidal ideation or hallucinations.   Discussed with husband at bedside with patient yesterday evening. He reports patient back to baseline and he would like to take her home 4/17. He feels confident her symptoms were secondary to lithium toxicity and that there was no intent to harm herself. He reports she has been on lithium for many years. He saw similar presentation when she had lithium toxicity approximately 2 years ago. He feels strongly that the patient should come off lithium and plans on contacting the psychiatrist's office today for further recommendations and follow. He is very active in her care and a good advocate for her in the healthcare system. He will take full control of her medications.  Consultants:  Telepsychiatry: May be discharged, followup with private psychiatrist as an outpatient as soon as possible  ACT team  Discharge Instructions  Discharge Orders   Future Orders Complete By Expires     Activity as tolerated - No restrictions  As directed     Diet Carb Modified  As directed     Discharge  instructions  As directed     Comments:      Be sure to contact your psychiatrist for followup as soon as  possible. Call your physician or seek immediate medical attention for confusion, hallucinations, suicidal or homicidal thoughts or worsening of your condition.        Medication List    STOP taking these medications       lurasidone 80 MG Tabs  Commonly known as:  LATUDA      TAKE these medications       amLODipine 5 MG tablet  Commonly known as:  NORVASC  Take 5 mg by mouth daily.     insulin aspart 100 UNIT/ML injection  Commonly known as:  novoLOG  Inject 5 Units into the skin 3 (three) times daily before meals. Sliding scale     losartan 100 MG tablet  Commonly known as:  COZAAR  Take 100 mg by mouth daily.     omeprazole 40 MG capsule  Commonly known as:  PRILOSEC  Take 40 mg by mouth daily.     ondansetron 4 MG tablet  Commonly known as:  ZOFRAN  Take 1 tablet (4 mg total) by mouth every 8 (eight) hours as needed for nausea.     traMADol 50 MG tablet  Commonly known as:  ULTRAM  Take 50 mg by mouth 3 (three) times daily as needed (headache).         The results of significant diagnostics from this hospitalization (including imaging, microbiology, ancillary and laboratory) are listed below for reference.      Labs: Basic Metabolic Panel:  Recent Labs Lab 04/15/12 0844 04/16/12 0504  NA 139 143  K 3.6 3.5  CL 106 110  CO2 25 25  GLUCOSE 121* 169*  BUN 15 8  CREATININE 0.56 0.72  CALCIUM 10.1 8.9   CBC:  Recent Labs Lab 04/15/12 0844  WBC 6.3  NEUTROABS 4.4  HGB 11.7*  HCT 35.5*  MCV 87.7  PLT 178   CBG:  Recent Labs Lab 04/16/12 1153 04/16/12 1703 04/16/12 2102 04/17/12 0754 04/17/12 1158  GLUCAP 127* 114* 173* 132* 168*    Principal Problem:   Encephalopathy, toxic Active Problems:   Lithium toxicity   Diabetes mellitus without complication   Depression   IBS (irritable bowel syndrome)   HTN (hypertension)   Bipolar 1 disorder   Suicide ideation   Time coordinating discharge: 35 minutes  Signed:  Brendia Sacks, MD [facility] 04/17/2012, 12:25 PM

## 2012-06-25 ENCOUNTER — Ambulatory Visit (INDEPENDENT_AMBULATORY_CARE_PROVIDER_SITE_OTHER): Admitting: Adult Health

## 2012-06-25 ENCOUNTER — Encounter: Payer: Self-pay | Admitting: Adult Health

## 2012-06-25 VITALS — BP 124/80 | Ht 64.0 in | Wt 154.0 lb

## 2012-06-25 DIAGNOSIS — R922 Inconclusive mammogram: Secondary | ICD-10-CM

## 2012-06-25 DIAGNOSIS — E1140 Type 2 diabetes mellitus with diabetic neuropathy, unspecified: Secondary | ICD-10-CM

## 2012-06-25 DIAGNOSIS — N6459 Other signs and symptoms in breast: Secondary | ICD-10-CM

## 2012-06-25 DIAGNOSIS — E1142 Type 2 diabetes mellitus with diabetic polyneuropathy: Secondary | ICD-10-CM

## 2012-06-25 DIAGNOSIS — E1149 Type 2 diabetes mellitus with other diabetic neurological complication: Secondary | ICD-10-CM

## 2012-06-25 DIAGNOSIS — R9230 Dense breasts, unspecified: Secondary | ICD-10-CM

## 2012-06-25 HISTORY — DX: Dense breasts, unspecified: R92.30

## 2012-06-25 HISTORY — DX: Inconclusive mammogram: R92.2

## 2012-06-25 MED ORDER — GABAPENTIN 300 MG PO CAPS: ORAL_CAPSULE | ORAL | Status: DC

## 2012-06-25 NOTE — Patient Instructions (Addendum)
Follow up prn  Call [facility] to see [name] PADiabetic Neuropathy Diabetic neuropathy is a common complication caused by diabetes. Neuropathy is a term that means nerve disease or damage. If your diabetes is uncontrolled and you have high blood glucose (sugar) levels, over time, this can lead to damage to nerves throughout your body. There are three types of diabetic neuropathy:   Peripheral.  Autonomic.  Focal. PERIPHERAL NEUROPATHY Peripheral neuropathy is the most common form of diabetic neuropathy. It causes damage to the nerves of the feet and legs and eventually the hands and arms.  SYMPTOMS  Peripheral neuropathy occurs slowly over time. The peripheral nerves sense touch, hot and cold, and pain. When these nerves no longer work:   Your feet become numb.  You can no longer feel pressure or pain in your feet.  You may have burning, stabbing or aching pain. This can lead to:  Thick calluses over pressure areas.  Pressure sores.  Ulcers. Ulcers can become infected with germs (bacteria) and can even lead to infection in the bones of the feet. DIAGNOSIS  The diagnosis of diabetic neuropathy is difficult at best. Sensory function testing can be done with:  Light touch using a monofilament.  Vibration with tuning fork.  Sharp sensation with pin prick Other tests that can help diagnose neuropathy are:  Nerve Conduction Velocities (NCV). This checks the transmission of electrical current through a nerve.  Electromyography (EMG). This shows how muscles respond to electrical signals transmitted by nearby nerves.  Quantitative sensory testing, which is used to assess how your nerves respond to vibration and changes in temperature. AUTONOMIC NEUROPATHY The autonomic nervous system controls functions that you do not think about. Examples would be:   Heart beat.  Regulation of body temperature.  Blood pressure.  Urination.  Digestion.  Sweating.  Sexual  function. SYMPTOMS  The symptoms of autonomic neuropathy vary depending on which nerves are affected.   There can be problems with digestion such as:  Feeling sick to your stomach (nausea).  Vomiting.  Bloating.  Constipation.  Diarrhea.  Abdominal pain.  Difficulty with urination may occur because of the inability to sense when your bladder is full. You may have urine leakage (incontinence) or inability to empty your bladder completely (retention).  Palpitations or a feeling of an abnormal heart beat.  Blood pressure drops on arising (orthostatic hypotension). This can happen when you first sit up or stand up. It causes you to feel:  Dizzy.  Weak.  Faint.  Sexual functioning:  In men, inability to attain and maintain an erection.  In women, vaginal dryness and problems with decreased sexual desire and arousal. DIAGNOSIS  Diagnosis is often based on reported symptoms. Tell your medical caregiver if you experience:   Dizziness.  Constipation.  Diarrhea.  Inappropriate urination or inability to urinate.  Inability to get or maintain an erection. Tests that may be done include:  An EKG or Holter Monitor. These are tests that can help show problems with the heart rate or heart rhythm.  X-rays can be used to find if there are problems with your ability to properly empty food from your stomach into the small intestine after eating. FOCAL NEUROPATHY Focal neuropathy affects just one nerve tract and occurs suddenly. However, it usually improves by itself over time. It does not cause long term damage, and treatments are usually needed only until the problem improves. SYMPTOMS  Examples include:   Abnormal eye movements or abnormal alignment of both eyes.  Weakness in the wrist.  Foot drop, which results in inability to lift the foot properly. This causes abnormal walking or foot movement. DIAGNOSIS  Diagnosis is made based on your symptoms and what your caregiver  finds on your exam. Other tests that may be done include:  Nerve Conduction Velocities (NCV). This checks the transmission of electrical current through a nerve.  Electromyography (EMG). This shows how muscles respond to electrical signals transmitted by nearby nerves.  Quantitative sensory testing, which is used to assess how your nerves respond to vibration and changes in temperature. TREATMENT Once nerve damage occurs it cannot be reversed. The goal of treatment is to keep the disease from getting worse. If it gets worse, it will affect more nerve fibers. Controlling your blood (sugar) is the key. You will need to keep your blood glucose and A1c at the target range prescribed by your caregiver. Things that will help control blood glucose levels include:  Blood glucose monitoring.  Meal planning.  Physical activity.  Diabetes medication. Over time, maintaining lower blood glucose levels helps lessen symptoms. Sometimes, prescription pain medicine is needed. Focal neuropathy can be painful and unpredictable and occurs most often in older adults with diabetes.  SEEK MEDICAL CARE IF:   You develop peripheral nerve symptoms such as burning, numbness, or pain in your feet, legs or hands.  You develop autonomic nerve symptoms such as:  Dizziness.  Abnormal urinary control.  Inability to get an erection.  You develop focal nerve symptoms such as sudden abnormal eye movements or sudden foot drop. Document Released: 02/26/2001 Document Revised: 03/12/2011 Document Reviewed: 05/28/2008 [facility] Patient Information 2014 [place], [place].

## 2012-06-25 NOTE — Progress Notes (Signed)
Subjective:     Patient ID: [name], female   DOB: 07-27-1955, 57 y.o.   MRN: [id number]  HPI [name] is a 57 year old white female in today complaining of a rash on her left breast and her feet and legs hurt and she is crying about them.  Review of Systems Positives as in HPI   Reviewed past medical,surgical, social and family history. Reviewed medications and allergies.  Past Medical History  Diagnosis Date  . Diabetes mellitus without complication   . Depression   . IBS (irritable bowel syndrome)   . HTN (hypertension)   . Bipolar 1 disorder   . Hyperlipidemia   . Breast density 06/25/2012    Right breast density, will get mammogram and [place]  Current outpatient prescriptions:amLODipine (NORVASC) 5 MG tablet, Take 5 mg by mouth daily., Disp: , Rfl: ;  insulin aspart (NOVOLOG) 100 UNIT/ML injection, Inject 5 Units into the skin 2 (two) times daily with a meal. Sliding scale, Disp: , Rfl: ;  insulin glargine (LANTUS) 100 UNIT/ML injection, Inject 20 Units into the skin at bedtime., Disp: , Rfl: ;  losartan (COZAAR) 100 MG tablet, Take 100 mg by mouth daily., Disp: , Rfl:  omeprazole (PRILOSEC) 40 MG capsule, Take 40 mg by mouth daily., Disp: , Rfl: ;  ondansetron (ZOFRAN) 4 MG tablet, Take 1 tablet (4 mg total) by mouth every 8 (eight) hours as needed for nausea., Disp: 20 tablet, Rfl: 0;  traMADol (ULTRAM) 50 MG tablet, Take 50 mg by mouth 3 (three) times daily as needed (headache)., Disp: , Rfl:  gabapentin (NEURONTIN) 300 MG capsule, Take 1 today, then 1 bid on day 2 then 1 tid starting on day 3, Disp: 90 capsule, Rfl: 0  Objective:   Physical Exam BP 124/80  Ht 5\' 4"  (1.626 m)  Wt 154 lb (69.854 kg)  BMI 26.42 kg/m2   Skin warm and dry, right breast has no dominant mass, retraction or nipple discharge, on the left there is 3-4 red areas that do not itch and she says they are better,there is a density at 12 o'clock that is non tender, no retraction or nipple discharge.She has  multiple tattoos on her chest.On extremities she has some petechiae bilaterally and she says she has cream for that, but she says her legs hurt and burn,there is no swelling noted and they are warm and dry to touch.  Discussed with Dr Despina Hidden Assessment:      Left breast density and left breast rash Diabetic neuropathy    Plan:      Diagnostic bilateral mammogram and left breast US scheduled for 07/02/12 at 2:45 pm at [facility]    Rx Neurontin 300 mg # 90 1 today 1 bid tomorrow then 1 tid no refills, spoke with [name] office to [facility] before giving to her and will have her follow up with him Will talk after mammogram and follow up prn

## 2012-06-26 ENCOUNTER — Telehealth: Payer: Self-pay | Admitting: Adult Health

## 2012-06-26 NOTE — Telephone Encounter (Signed)
Pt called to say she feels much better taking the neurotin already and has appt with Romeo Apple on July 8th

## 2012-06-26 NOTE — Telephone Encounter (Signed)
No answer

## 2012-07-02 ENCOUNTER — Ambulatory Visit (HOSPITAL_COMMUNITY)
Admission: RE | Admit: 2012-07-02 | Discharge: 2012-07-02 | Disposition: A | Discharge status: Home / Self Care | Source: Ambulatory Visit | Attending: Adult Health | Admitting: Adult Health

## 2012-07-02 DIAGNOSIS — R9230 Dense breasts, unspecified: Secondary | ICD-10-CM

## 2012-07-02 DIAGNOSIS — N63 Unspecified lump in unspecified breast: Secondary | ICD-10-CM | POA: Insufficient documentation

## 2012-07-02 DIAGNOSIS — R922 Inconclusive mammogram: Secondary | ICD-10-CM

## 2012-07-03 ENCOUNTER — Telehealth: Payer: Self-pay | Admitting: Adult Health

## 2012-07-03 NOTE — Telephone Encounter (Signed)
No answer, will try later

## 2012-07-23 ENCOUNTER — Other Ambulatory Visit (HOSPITAL_COMMUNITY): Payer: Self-pay | Admitting: Physician Assistant

## 2012-07-23 DIAGNOSIS — R928 Other abnormal and inconclusive findings on diagnostic imaging of breast: Secondary | ICD-10-CM

## 2012-09-10 ENCOUNTER — Encounter (HOSPITAL_COMMUNITY)

## 2012-09-16 ENCOUNTER — Other Ambulatory Visit: Payer: TRICARE For Life (TFL) | Admitting: Adult Health

## 2012-10-08 ENCOUNTER — Encounter (HOSPITAL_COMMUNITY)

## 2012-11-05 ENCOUNTER — Telehealth (HOSPITAL_COMMUNITY): Payer: Self-pay | Admitting: Dietician

## 2012-11-05 NOTE — Telephone Encounter (Signed)
Called back at 1655. Counseled pt for 5 minutes of examples of appropriate low calorie, low carbohydrate snacks, such as fruit, yogurt, or a half sandwich. Teachback method used. Pt expressed understanding of diabetic diet principles and appreciative of information given.

## 2012-11-05 NOTE — Telephone Encounter (Signed)
Received message from pt left at 1312. Pt is inquiring about healthy snacks.

## 2012-11-18 ENCOUNTER — Telehealth (HOSPITAL_COMMUNITY): Payer: Self-pay | Admitting: Dietician

## 2012-11-18 NOTE — Telephone Encounter (Signed)
Received call from Child psychotherapist at [facility]. Reports pt continues to struggle with diabetes, despite attending multiple classes. Requesting one on one appointment for pt. Pt familiar to this RD due to multiple class attendance and telephone consultations.

## 2012-11-20 NOTE — Telephone Encounter (Signed)
Received voicemail left at 1549.

## 2012-11-20 NOTE — Telephone Encounter (Signed)
Called and left message at 1158.

## 2012-11-20 NOTE — Telephone Encounter (Signed)
Called at 1610. Pt reports she is trying to do the best she can with her diabetes, but it is difficult for her. She reports she tries to eat every 4-4.5 hours, but finds it difficult to do this due to her personal commitments. She also reports "My husband can eat whenever he wants and he doesn't get it. I'm putting my foot down and I'm gonna eat every 4 hours and do my own way; everyone else can go to another direction". She reports that her insulin dosage was recently increased, but she is still fearful of her blood sugar going too high. She reports that she is pleased with Dr. Donavan Foil care at [facility]. She does not see an endocrinologist at this time. She also reports she is going to the diabetes support group in [place].  Scheduled individual appointment for pt at 12/12/12 at 1000.

## 2012-12-12 ENCOUNTER — Encounter (HOSPITAL_COMMUNITY): Payer: Self-pay | Admitting: Dietician

## 2012-12-12 NOTE — Progress Notes (Signed)
Outpatient Initial Nutrition Assessment  Date:12/12/2012   Appt Start Time: 0959  Referring Physician: [facility] Clinical Social Work Reason for Visit: diabetes  Nutrition Assessment:  Height: 5\' 4"  (162.6 cm)   Weight: 153 lb (69.4 kg)   IBW: 120#  %IBW: 128% UBW: 140#  %UBW: 109% Body mass index is 26.25 kg/(m^2).  Meets criteria for overweight.  Goal Weight: 139# (10% of loss of current wt) Weight hx:  Wt Readings from Last 10 Encounters:  12/12/12 153 lb (69.4 kg)  06/25/12 154 lb (69.854 kg)  04/15/12 141 lb 5 oz (64.1 kg)   Pt reports UBW of 130-140#. She reports progressive wt gain over the past year. Wt hx reveals a 12# (8.5%) wt gain x 8 months.   Estimated nutritional needs:  Kcals/ day: 1300-1400 Protein (grams)/day: 56-70 Fluid (L)/ day: 1.3-1.4  PMH:  Past Medical History  Diagnosis Date  . Diabetes mellitus without complication   . Depression   . IBS (irritable bowel syndrome)   . HTN (hypertension)   . Bipolar 1 disorder   . Hyperlipidemia   . Breast density 06/25/2012    Right breast density, will get mammogram and [place]    Medications:  Current Outpatient Rx  Name  Route  Sig  Dispense  Refill  . amLODipine (NORVASC) 5 MG tablet   Oral   Take 5 mg by mouth daily.         Marland Kitchen gabapentin (NEURONTIN) 300 MG capsule      Take 1 today, then 1 bid on day 2 then 1 tid starting on day 3   90 capsule   0   . insulin aspart (NOVOLOG) 100 UNIT/ML injection   Subcutaneous   Inject 5 Units into the skin 2 (two) times daily with a meal. Sliding scale         . insulin glargine (LANTUS) 100 UNIT/ML injection   Subcutaneous   Inject 20 Units into the skin at bedtime.         Marland Kitchen losartan (COZAAR) 100 MG tablet   Oral   Take 100 mg by mouth daily.         Marland Kitchen omeprazole (PRILOSEC) 40 MG capsule   Oral   Take 40 mg by mouth daily.         . ondansetron (ZOFRAN) 4 MG tablet   Oral   Take 1 tablet (4 mg total) by mouth every 8 (eight) hours as  needed for nausea.   20 tablet   0   . traMADol (ULTRAM) 50 MG tablet   Oral   Take 50 mg by mouth 3 (three) times daily as needed (headache).           Labs: CMP     Component Value Date/Time   NA 143 04/16/2012 0504   K 3.5 04/16/2012 0504   CL 110 04/16/2012 0504   CO2 25 04/16/2012 0504   GLUCOSE 169* 04/16/2012 0504   BUN 8 04/16/2012 0504   CREATININE 0.72 04/16/2012 0504   CALCIUM 8.9 04/16/2012 0504   PROT 6.0 02/13/2010 0538   ALBUMIN 3.4* 02/13/2010 0538   AST 15 02/13/2010 0538   ALT 21 02/13/2010 0538   ALKPHOS 65 02/13/2010 0538   BILITOT 0.4 02/13/2010 0538   GFRNONAA >90 04/16/2012 0504   GFRAA >90 04/16/2012 0504    Lipid Panel  No results found for this basename: chol, trig, hdl, cholhdl, vldl, ldlcalc     Lab Results  Component Value Date  HGBA1C 4.8 04/15/2012   HGBA1C  Value: 4.8 (NOTE)                                                                       According to the ADA Clinical Practice Recommendations for 2011, when HbA1c is used as a screening test:   >=6.5%   Diagnostic of Diabetes Mellitus           (if abnormal result  is confirmed)  5.7-6.4%   Increased risk of developing Diabetes Mellitus  References:Diagnosis and Classification of Diabetes Mellitus,Diabetes Care,2011,34(Suppl 1):S62-S69 and Standards of Medical Care in         Diabetes - 2011,Diabetes Care,2011,34  (Suppl 1):S11-S61. 02/11/2010   Lab Results  Component Value Date   CREATININE 0.72 04/16/2012     Lifestyle/ social habits: Ms. Toppin resides in [place] with her husband.  She is extremely familiar to this RD, as she has attended several diabetes classes at [facility] in the past (record indicate attendance of 03/07/10 and 04/01/12). She also frequently calls RD office with multiple questions throughout the year. Her PCP is Dr. Loreta Ave at [facility]. She has been seen by endocrinologist Dr. Fransico Him in the past, but was lost to follow-up. Ms. Pingleton reports she is not physically active, but has  contemplated going to the [employer] class. She reports she is going to go once a week and then work up to multiple days per week.  Pt was referred by Lupita Leash, social worker at [facility]. She reports that she fears that Ms. Crumm is at high risk for hospitalization for her diabetes. Lupita Leash reports that despite multiple education classes, Ms. Vanrossum still struggles with managing her diabetes. To prevent hospitalization, this RD was requested to meet with pt individually ASAP for intensive review on diabetes management principles. Noted that pt was last admitted to [facility] in 04/2012 for lithium toxicity and also noted a hospital admission in 02/2010.  Nutrition hx/habits: Ms. Barritt reports that she is "in a bad mood" today and is "disgusted" about her poor glycemic control. She reports extreme frustration, reflecting "I get bad feeling and gets aggravated; sometimes I go home and cry". She has hx of mental illness, including depression. She is followed by a psychiatrist Dr. Mliss Fritz in [place] ([facility]) for the past 14 years and has also been seeing the counselor in Dr. Milas Hock office for the past few months.  Ms. Boruff reports that she self-monitors 4 times per day: AM fasting, before lunch, before dinner, and q HS. She reports that readings range from 195-350.She reports that AM fasting reading was 167 today. She is currently on sliding scale insulin. She reports eating on a regular schedule (breakfast between 7-9 AM, Lunch from 12-2 PM, and Supper from 5-7 PM). She chooses healthy snacks such as low fat yogurt, 2 slices of peach or rice cake with peanut butter.  She just attended the Diabetes Support Group at [facility] last Monday. She reports frustration that she has "short term memory loss" and as a result cannot retain much information. She told this RD "I won't remember anything you told me today" and admits to constantly reading diabetes education materials on her tablet.  She has a  notebook with notes from  the support group. Interview reveals that pt has understanding of basic elements of diabetes principles, but has difficulty applying principles practically. An additional barrier is meal planning; she reports that she has a difficult time with planning dinner, as her husband will not eat many vegetables, however, pt is open to eat all types of vegetables. She reports no difficulty during lunch and breakfast as she eats alone. She also expresses and genuine interest in cooking, as she feels that this will help empower her to improve her blood sugars.  She is hesitant to revisit an endocrinologist, reporting that she did not have a good experience in Dr. Isidoro Donning office and feels like her PCP can adequate manage her diabetes.  Unfortunately, psychiatric issues are the largest barrier for pt to improve glycemic control and quality of life.   Nutrition Diagnosis: Nutrition-related knowledge deficit r/t learning retention difficulty AEB poor glycemic control despite multiple education sessions, pt with multiple questions.   Nutrition Intervention: Nutrition rx: 1300-1400 kcal NAS, diabetic diet; 3 meals per day; limit 1 starch per meal; low calorie beverages only; physical activity as tolerated  Education/Counseling Provided: Most of the visit was spend on counseling to optimize glycemic control and translate information retained to practical information that pt could use in her daily life. Diabetic diet using the plate method was reviewed. Identified sources of carbohydrate and enouraged limiting sources of high carbohydrate foods in diet. Used food guide for pt to identify foods from each food group that she liked and made a large representation of the plate method in her notebook and placed favorite foods in appropriate areas of the plate.  Discussed importance of regular meal pattern. Discussed importance of adding sources of whole grains to diet to improve glycemic control. Also  encouraged to choose low fat dairy, lean meats, and whole fruits and vegetables more often. Discussed nutritional content of foods commonly eaten and discussed healthier alternatives. Discussed importance of compliance to prevent further complications of disease. Educated pt on importance of physical activity (goal of at least 30 minutes 5 times per week) along with a healthy diet to achieve weight loss and glycemic goals. Encouraged slow progression to work toward desired exercise goals. Encouraged slow, moderate weight loss of 1-2# per week, or 7-10% of current body weight. Encouraged continued attendance of support group and possible follow-up with endocrinologist. Provided "Carbohydrate Counting and Meal Planning", "Diabetes and You", and "Your Guide to Better Office Visits" handouts. Used TeachBack to assess understanding.   Understanding, Motivation, Ability to Follow Recommendations: Expect fair compliance.  Monitoring and Evaluation: Goals: 1) 0.5-2# wt loss per week; 2) Physical activity as tolerated; 3) Hgb A1c < 7.0; 4) Fasting blood sugar between 70-130, postprandial blood sugar less than 180   Recommendations: 1) Keep a blood sugar and food dairy; 2) Make a list when grocery shopping; 3) Plan meals for the week prior to making grocery list; 4) Work up to exercise goals; 5) Discuss possibility re: referral to endocrinologist at next PCP appointment  F/U: PRN. Pt has RD contact information.  Golda Zavalza A. Mayford Knife, RD, LDN 12/12/2012  Appt EndTime: 1103

## 2013-02-06 ENCOUNTER — Telehealth (HOSPITAL_COMMUNITY): Payer: Self-pay | Admitting: Dietician

## 2013-02-06 NOTE — Telephone Encounter (Signed)
Received voicemail at 1025. She reports she has a question about diabetes.

## 2013-02-10 NOTE — Telephone Encounter (Signed)
Called back at 1225. Pt reports she is feeling better. She has been attending classes at [facility] but "got mad" because of insurance issues. She reported that she was told to take classes [facility] [place] [place].  She reports that she is doing better and is planning on taking a 6 week class in diabetes [facility] [employer] [facility]. She is looking forward to the class and reports that she is doing better at managing her diabetes. She has no further questions at this time.

## 2013-03-23 ENCOUNTER — Ambulatory Visit (INDEPENDENT_AMBULATORY_CARE_PROVIDER_SITE_OTHER): Admitting: Internal Medicine

## 2013-03-23 ENCOUNTER — Encounter: Payer: Self-pay | Admitting: Internal Medicine

## 2013-03-23 VITALS — BP 138/82 | HR 92 | Temp 98.3°F | Resp 12 | Ht 64.0 in | Wt 164.8 lb

## 2013-03-23 DIAGNOSIS — E119 Type 2 diabetes mellitus without complications: Secondary | ICD-10-CM

## 2013-03-23 MED ORDER — SITAGLIPTIN PHOSPHATE 100 MG PO TABS: 100.0000 mg | ORAL_TABLET | Freq: Every day | ORAL | Status: DC

## 2013-03-23 NOTE — Patient Instructions (Signed)
-   Continue Metformin 1000 mg 2x a day - decrease Lantus to 15 units at night - stop Novolog 5 units  - continue Welchol 3 tabs bid - start Januvia 100 mg daily   Please call me if sugars consistently <80 or >200.  PATIENT INSTRUCTIONS FOR TYPE 2 DIABETES:  **Please join MyChart!** - see attached instructions about how to join   DIET AND EXERCISE Diet and exercise is an important part of diabetic treatment.  We recommended aerobic exercise in the form of brisk walking (working between 40-60% of maximal aerobic capacity, similar to brisk walking) for 150 minutes per week (such as 30 minutes five days per week) along with 3 times per week performing 'resistance' training (using various gauge rubber tubes with handles) 5-10 exercises involving the major muscle groups (upper body, lower body and core) performing 10-15 repetitions (or near fatigue) each exercise. Start at half the above goal but build slowly to reach the above goals. If limited by weight, joint pain, or disability, we recommend daily walking in a swimming pool with water up to waist to reduce pressure from joints while allow for adequate exercise.    BLOOD GLUCOSES Monitoring your blood glucoses is important for continued management of your diabetes. Please check your blood glucoses 2-4 times a day: fasting, before meals and at bedtime (you can rotate these measurements - e.g. one day check before the 3 meals, the next day check before 2 of the meals and before bedtime, etc.   HYPOGLYCEMIA (low blood sugar) Hypoglycemia is usually a reaction to not eating, exercising, or taking too much insulin/ other diabetes drugs.  Symptoms include tremors, sweating, hunger, confusion, headache, etc. Treat IMMEDIATELY with 15 grams of Carbs:   4 glucose tablets    cup regular juice/soda   2 tablespoons raisins   4 teaspoons sugar   1 tablespoon honey Recheck blood glucose in 15 mins and repeat above if still symptomatic/blood glucose  <100. Please contact our office at [phone] if you have questions about how to next handle your insulin.  RECOMMENDATIONS TO REDUCE YOUR RISK OF DIABETIC COMPLICATIONS: * Take your prescribed MEDICATION(S). * Follow a DIABETIC diet: Complex carbs, fiber rich foods, heart healthy fish twice weekly, (monounsaturated and polyunsaturated) fats * AVOID saturated/trans fats, high fat foods, >2,300 mg salt per day. * EXERCISE at least 5 times a week for 30 minutes or preferably daily.  * DO NOT SMOKE OR DRINK more than 1 drink a day. * Check your FEET every day. Do not wear tightfitting shoes. Contact us if you develop an ulcer * See your EYE doctor once a year or more if needed * Get a FLU shot once a year * Get a PNEUMONIA vaccine once before and once after age 60 years  GOALS:  * Your Hemoglobin A1c of <7%  * fasting sugars need to be <130 * after meals sugars need to be <180 (2h after you start eating) * Your Systolic BP should be 140 or lower  * Your Diastolic BP should be 80 or lower  * Your HDL (Good Cholesterol) should be 40 or higher  * Your LDL (Bad Cholesterol) should be 100 or lower  * Your Triglycerides should be 150 or lower  * Your Urine microalbumin (kidney function) should be <30 * Your Body Mass Index should be 25 or lower   We will be glad to help you achieve these goals. Our telephone number is: [phone].

## 2013-03-23 NOTE — Progress Notes (Signed)
Patient ID: [name], female   DOB: 09/16/1955, 58 y.o.   MRN: [id number]  HPI: [name] is a 58 y.o.-year-old female, referred by her PCP, Dr. Assunta Found (PA: Lenise Herald), for management of DM2, insulin-dependent, controlled, with complications (PN, gastroparesis?). She saw Dr Fransico Him before.   Patient has been diagnosed with diabetes in 2005; she started insulin in ~2006. Previous HbA1c per records: - 02/12/2013: 4.2% - 12/19/2012: 5.2% - 09/18/2012: 6.5% Lab Results  Component Value Date   HGBA1C 4.8 04/15/2012   HGBA1C 4.8  02/11/2010  Per labs from 03/17/2013: no anemia, no increased RDW, no CKD, no thyroid dysfxn (last TSH was 1.918 in 03/18/2013).  Pt is on a regimen of: - Metformin 1000 mg 2x a day - Lantus 20 units qhs - Novolog 5 units bid ac, SSI: target 150, ISF 50 - Welchol 3 tabs bid  Pt checks her sugars  4x a daya day and they are: - am:  102-218, most low 100s - 2h after b'fast: n/c - before lunch: 52-149 - 2h after lunch: n/c - before dinner: 122-225 - 2h after dinner: n/c - bedtime: 91-227 Has lows. Lowest sugar was 39; she has hypoglycemia awareness at 50, but did not feel the 39..  Highest sugar was 300 (ate a sweet before)  Pt's meals are: - Breakfast: toast, bowl of cereal + 2% milk  - Lunch: soup, sometimes sandwich, vegetables - Dinner: chicken/pork/beef + vegetable, no starch - Snacks: sugar free sweets Had DM education in 2012. Her insurance does not pay for nutrition or DM edu classes.  Exercises daily: walks, lifts weights.  - no CKD, last BUN/creatinine: 13/0.55 ion 03/18/2013. Previously: Lab Results  Component Value Date   BUN 8 04/16/2012   CREATININE 0.72 04/16/2012  On Losartan. - last set of lipids: 161/92/46/97 in 03/18/2013.  Not on a statin  - last eye exam was in ~03/2012 - [place]. No DR.  - + numbness and tingling in her feet. On Neurontin. Foot exam checked by PCP: 09/18/2012.   Pt has FH of DM in [place].Marland Kitchen  Pt has a  h/o admission for Li toxicity 04/2012. 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 Also: GERD, HTN, HL, Fibromyalgia.  ROS: Constitutional: no weight gain/loss, + increased appetite, + fatigue, + subjective hyperthermia, + poor sleep Eyes: no blurry vision, no xerophthalmia ENT: no sore throat, no nodules palpated in throat, no dysphagia/odynophagia, no hoarseness Cardiovascular: no CP/SOB/+ palpitations/no leg swelling Respiratory: no cough/SOB Gastrointestinal: no N/V/D/+ C Musculoskeletal: + both: muscle/joint aches Skin: no rashes, + hair loss Neurological: no tremors/numbness/tingling/dizziness Psychiatric: + both depression/anxiety + low libido  Past Medical History  Diagnosis Date  . Diabetes mellitus without complication   . Depression   . IBS (irritable bowel syndrome)   . HTN (hypertension)   . Bipolar 1 disorder   . Hyperlipidemia   . Breast density 06/25/2012    Right breast density, will get mammogram and [place]   Past Surgical History  Procedure Laterality Date  . Cholecystectomy    . Abdominal hysterectomy     History   Social History  . Marital Status: Married    Spouse Name: N/A    Number of Children: 1   Occupational History  . none   Social History Main Topics  . Smoking status: Never Smoker   . Smokeless tobacco: Never Used  . Alcohol Use: No  . Drug Use: No   Current Outpatient Prescriptions on  File Prior to Visit  Medication Sig Dispense Refill  . amLODipine (NORVASC) 5 MG tablet Take 5 mg by mouth daily.      Marland Kitchen gabapentin (NEURONTIN) 300 MG capsule Take 1 today, then 1 bid on day 2 then 1 tid starting on day 3  90 capsule  0  . insulin aspart (NOVOLOG) 100 UNIT/ML injection Inject 5 Units into the skin 2 (two) times daily with a meal. Sliding scale      . insulin glargine (LANTUS) 100 UNIT/ML injection Inject 20 Units into the skin at bedtime.      Marland Kitchen losartan (COZAAR) 100 MG tablet Take 100 mg by mouth daily.      Marland Kitchen omeprazole  (PRILOSEC) 40 MG capsule Take 40 mg by mouth daily.      . ondansetron (ZOFRAN) 4 MG tablet Take 1 tablet (4 mg total) by mouth every 8 (eight) hours as needed for nausea.  20 tablet  0  . traMADol (ULTRAM) 50 MG tablet Take 50 mg by mouth 3 (three) times daily as needed (headache).       No current facility-administered medications on file prior to visit.   No Known Allergies Family History  Problem Relation Age of Onset  . Hypertension Mother   . Heart disease Brother   . Thyroid disease Sister   . Hypertension Sister    PE: BP 138/82  Pulse 92  Temp(Src) 98.3 F (36.8 C) (Oral)  Resp 12  Ht 5\' 4"  (1.626 m)  Wt 164 lb 12.8 oz (74.753 kg)  BMI 28.27 kg/m2  SpO2 97% Wt Readings from Last 3 Encounters:  03/23/13 164 lb 12.8 oz (74.753 kg)  12/12/12 153 lb (69.4 kg)  06/25/12 154 lb (69.854 kg)   Constitutional: overweight, in NAD Eyes: PERRLA, EOMI, no exophthalmos ENT: moist mucous membranes, no thyromegaly, no cervical lymphadenopathy Cardiovascular: RRR, No MRG Respiratory: CTA B Gastrointestinal: abdomen soft, NT, ND, BS+ Musculoskeletal: no deformities, strength intact in all 4 Skin: moist, warm, no rashes Neurological: no tremor with outstretched hands, DTR normal in all 4  ASSESSMENT: 1. DM2, insulin-dependent, uncontrolled, with complications - peripheral neuropathy - gastroparesis?  PLAN:  1. Patient with long-standing, recently overly controlled diabetes, on basal-bolus + oral antidiabetic regimen, with a HbA1c in the 4%'s and many low CBGs. Pt is very detailed about her DM control and checks sugars at least 4x a day, bringing a detailed log. She would not get out of the house much as she is afraid she might not be able to eat or take her insulin in time. She is asking me whether I think she can go to [facility] in a month from now... - we discussed about reducing the intensity of her tx - since the mortality for HbA1c follows a U-shaped curve. Also, her  quality of life appears impaired because of the intensive DM tx. - We discussed about options for treatment, and I suggested to:  Patient Instructions  - Continue Metformin 1000 mg 2x a day - decrease Lantus to 15 units at night - stop Novolog - continue Welchol 3 tabs bid - start Januvia 100 mg daily - given samples  Please call me if sugars consistently <80 or >200. - I am hoping we can also stop Lantus soon. - continue checking sugars at different times of the day - check 2-3 times a day, rotating checks - given new sugar log and advised how to fill it and to bring it at next appt  -  given foot care handout and explained the principles  - given instructions for hypoglycemia management "15-15 rule"  - advised for yearly eye exams >> needs one soon - Return to clinic in 1 mo with sugar log

## 2013-03-24 ENCOUNTER — Telehealth: Payer: Self-pay | Admitting: *Deleted

## 2013-03-24 NOTE — Telephone Encounter (Signed)
Pt called to let us know her bKoreag this am was 189, before lunch 255 and after lunch 161. Pt concerned that not taking her insulin was a problem. Advised pt to do as Dr Elvera LennoxGherghe said and her body would need to adjust to the changes in medication. Pt understood and will call if her bg stays in the 200's for a few days.

## 2013-03-25 NOTE — Telephone Encounter (Signed)
Pt calling regarding Januvia she has been on it for 3 days and her stomach is not well. Could this be a side effect.

## 2013-03-26 ENCOUNTER — Telehealth: Payer: Self-pay | Admitting: Internal Medicine

## 2013-03-26 NOTE — Telephone Encounter (Signed)
How long will the nausea last?

## 2013-03-26 NOTE — Telephone Encounter (Signed)
Returned pt's call and lvm advising her that could be a side effect. Some side effects include nausea, vomiting, diarrhea or constipation. Advised pt that she needs to give the medication time and if her side effects get worse to call our office. Be advised.

## 2013-03-26 NOTE — Telephone Encounter (Signed)
Agree 

## 2013-03-26 NOTE — Telephone Encounter (Signed)
Called pt and advised her that unsure how long it will last. We are all different and our bodies are different with medications. Pt stated she hope it did.

## 2013-04-01 ENCOUNTER — Telehealth: Payer: Self-pay | Admitting: Internal Medicine

## 2013-04-01 NOTE — Telephone Encounter (Signed)
Patient states blood sugar is over 200 daily  Please advise patient as she was to stop her insulin   Call back:[phone]  Thank You :)

## 2013-04-01 NOTE — Telephone Encounter (Signed)
Called pt and advised her per Dr Charlean SanfilippoGherghe's note. Pt stated she will call in 1 week with sugar log.

## 2013-04-01 NOTE — Telephone Encounter (Signed)
Keep Lantus then at 15 units at night. Let me know about the sugars in 1 week.

## 2013-04-01 NOTE — Telephone Encounter (Signed)
Please read note below and advise.  

## 2013-04-09 ENCOUNTER — Telehealth: Payer: Self-pay | Admitting: Internal Medicine

## 2013-04-09 NOTE — Telephone Encounter (Signed)
Please read note below and advise in Dr Gherghe's absence. Thank you.  

## 2013-04-09 NOTE — Telephone Encounter (Signed)
Pt is nervous with just the [place]uv[place]. She has still had some high BS ratings in the last 32 checks 8 have been over 200.

## 2013-04-09 NOTE — Telephone Encounter (Signed)
If she is not taking Lantus she needs to start 15 units as instructed a week ago If she is taking 15 units and her blood sugars are high mostly on waking up she can go up to 200 If her blood sugars are high after meals only then she needs to let me know how much NovoLog she is taking and after which meal it is high

## 2013-04-09 NOTE — Telephone Encounter (Signed)
Called pt to advise her per Dr Remus BlakeKumar's note, before I could advise her. Pt stated that she thought about it and checked her chart. She said she has checked her blood sugar 72 x in the past 18 days and she has only had readings over 200, 8 times. She said she was going to stop worrying about it and continue to eat right, take her medication and discuss with Dr Elvera LennoxGherghe at her appt next week. Advised pt that is a good idea. Pt agreed.

## 2013-04-16 ENCOUNTER — Ambulatory Visit (INDEPENDENT_AMBULATORY_CARE_PROVIDER_SITE_OTHER): Admitting: Internal Medicine

## 2013-04-16 ENCOUNTER — Encounter: Payer: Self-pay | Admitting: Internal Medicine

## 2013-04-16 VITALS — BP 130/76 | HR 94 | Temp 97.8°F | Resp 12 | Wt 160.0 lb

## 2013-04-16 DIAGNOSIS — E1149 Type 2 diabetes mellitus with other diabetic neurological complication: Secondary | ICD-10-CM

## 2013-04-16 DIAGNOSIS — E1142 Type 2 diabetes mellitus with diabetic polyneuropathy: Secondary | ICD-10-CM

## 2013-04-16 MED ORDER — SITAGLIPTIN PHOSPHATE 100 MG PO TABS: 100.0000 mg | ORAL_TABLET | Freq: Every day | ORAL | Status: DC

## 2013-04-16 NOTE — Patient Instructions (Signed)
Please return in 3 months, with your sugar log. Continue current regimen.

## 2013-04-16 NOTE — Progress Notes (Signed)
Patient ID: Sabrina ComptonBarbara L [name], female   DOB: 09/28/1955, 58 y.o.   MRN: [id number]  HPI: Sabrina ComptonBarbara L [name] is a 58 y.o.-year-old female, referred by her PCP, Dr. Assunta FoundJohn Golding (PA: Lenise HeraldBenjamin Mann), for management of DM2, insulin-dependent, controlled, with complications (PN, gastroparesis?). Last visit 1 mo ago.  Patient has been diagnosed with diabetes in 2005; she started insulin in ~2006. Previous HbA1c per records: - 02/12/2013: 4.2% - 12/19/2012: 5.2% - 09/18/2012: 6.5% Lab Results  Component Value Date   HGBA1C 4.8 04/15/2012   HGBA1C 4.8  02/11/2010  Per labs from 03/17/2013: no anemia, no increased RDW, no CKD, no thyroid dysfxn (last TSH was 1.918 in 03/18/2013).  Pt is on a regimen of: - Metformin 1000 mg 2x a day - Lantus 20 units qhs - Welchol 3 tabs bid - At last visit (03/2013), we stopped Novolog 5 units bid ac and started Januvia 100 mg.  Pt checks her sugars  4x a daya day and they are: - am:  102-218, most low 100s >> 111-209 - 2h after b'fast: n/c >> 125-285 - before lunch: 52-149 >> 86-215 (mostly 90-140) - 2h after lunch: n/c >> 72-150 - before dinner: 122-225 >> 99-197 - 2h after dinner: n/c >> 166 - bedtime: 91-227 >> 78-209 (most <150)  Has lows. Lowest sugar was 72; she has hypoglycemia awareness at 50. Highest sugar was 286 (ate a sweet before)  Pt's meals are: - Breakfast: toast, bowl of cereal + 2% milk  - Lunch: soup, sometimes sandwich, vegetables - Dinner: chicken/pork/beef + vegetable, no starch - Snacks: sugar free sweets Had DM education in 2012. Her insurance does not pay for nutrition or DM edu classes.  Exercises daily: walks, lifts weights.  - no CKD, last BUN/creatinine: 13/0.55 ion 03/18/2013. Previously: Lab Results  Component Value Date   BUN 8 04/16/2012   CREATININE 0.72 04/16/2012  On Losartan. - last set of lipids: 161/92/46/97 in 03/18/2013.  Not on a statin  - last eye exam was in 09//2014 - [place]. No DR.  - + numbness and  tingling in her feet. On Neurontin. Foot exam checked by PCP: 09/18/2012.   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 Also: GERD, HTN, HL, Fibromyalgia.  I reviewed pt's medications, allergies, PMH, social hx, family hx and no changes required, except as mentioned above.  ROS: Constitutional: no weight gain/loss, no increased appetite anymore, + fatigue, no subjective hyperthermia, + poor sleep, + increased urination Eyes: no blurry vision, no xerophthalmia ENT: no sore throat, no nodules palpated in throat, no dysphagia/odynophagia, no hoarseness Cardiovascular: no CP/SOB/no palpitations/no leg swelling Respiratory: no cough/SOB Gastrointestinal: no N/V/D/+ C Musculoskeletal: + both: muscle/joint aches Skin: no rashes Neurological: no tremors/numbness/tingling/dizziness  PE: BP 130/76  Pulse 94  Temp(Src) 97.8 F (36.6 C) (Oral)  Resp 12  Wt 160 lb (72.576 kg)  SpO2 97% Wt Readings from Last 3 Encounters:  04/16/13 160 lb (72.576 kg)  03/23/13 164 lb 12.8 oz (74.753 kg)  12/12/12 153 lb (69.4 kg)   Constitutional: overweight, in NAD Eyes: PERRLA, EOMI, no exophthalmos ENT: moist mucous membranes, no thyromegaly, no cervical lymphadenopathy Cardiovascular: RRR, No MRG Respiratory: CTA B Gastrointestinal: abdomen soft, NT, ND, BS+ Musculoskeletal: no deformities, strength intact in all 4 Skin: moist, warm, no rashes Neurological: no tremor with outstretched hands, DTR normal in all 4  ASSESSMENT: 1. DM2, insulin-dependent, uncontrolled, with complications - peripheral neuropathy -  gastroparesis?  PLAN:  1. Patient with long-standing, previously on obasal-bolus + oral antidiabetic regimen ( - with HbA1c in the 4%'s and many low CBGs). At last visit, we stopped mealtime insulin >> started Januvia. Pt is very detailed about her DM control and checks sugars at least 4x a day, bringing a detailed log. Her sugars are  controlled w/o mealtime insulin, only has few high spikes now -  at next visit, I plan to check both a HbA1c and a fructosamine, to confirm. -  I suggested to:    Patient Instructions  Please return in 3 months, with your sugar log. Continue current regimen.  - given sample of Januvia - I am hoping we can also stop Lantus soon. - continue checking sugars at different times of the day - check 2-3 times a day, rotating checks - up to date with eye exams - Return to clinic in 3 mo with sugar log

## 2013-05-18 ENCOUNTER — Telehealth: Payer: Self-pay | Admitting: Internal Medicine

## 2013-05-18 ENCOUNTER — Other Ambulatory Visit: Payer: Self-pay | Admitting: *Deleted

## 2013-05-18 MED ORDER — INSULIN GLARGINE 100 UNIT/ML SOLOSTAR PEN: 20.0000 [IU] | PEN_INJECTOR | Freq: Every day | SUBCUTANEOUS | Status: DC

## 2013-05-18 NOTE — Telephone Encounter (Signed)
[facility]-instructed pt to call pharmacy

## 2013-05-18 NOTE — Telephone Encounter (Signed)
Done

## 2013-05-18 NOTE — Telephone Encounter (Signed)
[facility] needs a 90 day supply reference #[id number]

## 2013-05-21 ENCOUNTER — Telehealth: Payer: Self-pay | Admitting: Internal Medicine

## 2013-05-21 ENCOUNTER — Telehealth: Payer: Self-pay | Admitting: *Deleted

## 2013-05-21 NOTE — Telephone Encounter (Signed)
Returned pt's call. Pt states that her bg levels have been over 200 for the past 5 days. This am it was 137, which is better. Pt states she is under a lot of stress, not being strict with her diet, moving and her mother's leukemia is back. She is not sure what to do. Please advise.

## 2013-05-21 NOTE — Telephone Encounter (Signed)
Try to control diet, but if sugars still high, may need to add back Novolog 5 units before meals for this period of higher stress.

## 2013-05-21 NOTE — Telephone Encounter (Signed)
Opened encounter in error  

## 2013-05-21 NOTE — Telephone Encounter (Signed)
Patient would like to speak with Dr. Althea CharonGherghes medical assistant regarding her blood sugar levels  Please call patient back   Thank you :)

## 2013-05-21 NOTE — Telephone Encounter (Signed)
Called pt and advised her to, try to control her diet, but if sugars are still high, may need to add back Novolog 5 units before meals, for this period of higher stress. Pt understood and said she would let us know. Be advised.

## 2013-05-26 ENCOUNTER — Other Ambulatory Visit: Payer: Self-pay | Admitting: Internal Medicine

## 2013-05-26 ENCOUNTER — Telehealth: Payer: Self-pay | Admitting: Internal Medicine

## 2013-05-26 ENCOUNTER — Other Ambulatory Visit: Payer: Self-pay | Admitting: *Deleted

## 2013-05-26 DIAGNOSIS — E1142 Type 2 diabetes mellitus with diabetic polyneuropathy: Secondary | ICD-10-CM

## 2013-05-26 MED ORDER — SITAGLIPTIN PHOSPHATE 100 MG PO TABS: 100.0000 mg | ORAL_TABLET | Freq: Every day | ORAL | Status: DC

## 2013-05-26 MED ORDER — GLIPIZIDE 5 MG PO TABS: 5.0000 mg | ORAL_TABLET | Freq: Two times a day (BID) | ORAL | Status: DC

## 2013-05-26 NOTE — Telephone Encounter (Signed)
Patient states her blood sugar   May 3rd May 16 went over 200 7 times  May 17 May 26 went over 200 18 times  The only thing has changed she is taking Latuda 30 mg and was increased to 40 mg May 12 was when it was increased  She denies change of diet or exercise   Please advise   Patient states she will be home at 2:00 pm   Thank You

## 2013-05-26 NOTE — Telephone Encounter (Signed)
Called pt and advised her per Dr Charlean Sanfilippo note. Pt understood, but concerned about taking so many medications. Advised pt to just try this and then call us back in a few days with her sugar readings. Pt understood.

## 2013-05-26 NOTE — Telephone Encounter (Signed)
I called in Glipizide 5 mg bid before meals for her to [employer]. Let's try this. Let us know about sugars in few days.

## 2013-05-26 NOTE — Telephone Encounter (Signed)
Changed rx to 90 day pharmacy, [facility].

## 2013-05-26 NOTE — Telephone Encounter (Signed)
Pt spoke with [name] an hour ago she is having a panic attack right now and is not understanding what is happening with her body. Why is she having to increase her meds again. She is scared to take the new pill. Her psychiatrist won't talk with her.

## 2013-05-26 NOTE — Telephone Encounter (Signed)
Please read note below and advise.  

## 2013-05-27 ENCOUNTER — Telehealth: Payer: Self-pay | Admitting: Internal Medicine

## 2013-05-27 ENCOUNTER — Other Ambulatory Visit: Payer: Self-pay | Admitting: *Deleted

## 2013-05-27 MED ORDER — GLIPIZIDE 5 MG PO TABS: 5.0000 mg | ORAL_TABLET | Freq: Two times a day (BID) | ORAL | Status: DC

## 2013-05-27 NOTE — Telephone Encounter (Signed)
Patient would like for you to call in [facility]ide [facility]   Thank You :)

## 2013-05-27 NOTE — Telephone Encounter (Signed)
Pt requested to have her glipizide sent to [facility].

## 2013-05-29 ENCOUNTER — Encounter: Payer: Self-pay | Admitting: Internal Medicine

## 2013-05-29 ENCOUNTER — Ambulatory Visit (INDEPENDENT_AMBULATORY_CARE_PROVIDER_SITE_OTHER): Admitting: Internal Medicine

## 2013-05-29 VITALS — BP 122/74 | HR 93 | Temp 98.4°F | Resp 12 | Wt 161.0 lb

## 2013-05-29 DIAGNOSIS — E1149 Type 2 diabetes mellitus with other diabetic neurological complication: Secondary | ICD-10-CM

## 2013-05-29 DIAGNOSIS — E1142 Type 2 diabetes mellitus with diabetic polyneuropathy: Secondary | ICD-10-CM

## 2013-05-29 NOTE — Progress Notes (Signed)
Patient ID: Sabrina ComptonBarbara L [name], female   DOB: 04/24/1955, 58 y.o.   MRN: [id number]  HPI: [name] is a [age] y.o.-year-old female, initially referred by her PCP, Dr. Assunta FoundJohn Golding (PA: Lenise HeraldBenjamin Mann), for management of DM2, dx 2005, insulin-dependent since ~2006, controlled, with complications (PN, gastroparesis?). She returns for f/u for her DM2. Last visit 1.5 mo ago.  Previous HbA1c per records: - 02/12/2013: 4.2% - 12/19/2012: 5.2% - 09/18/2012: 6.5% Lab Results  Component Value Date   HGBA1C 4.8 04/15/2012   HGBA1C 4.8  02/11/2010  Per labs from 03/17/2013: no anemia, no increased RDW, no CKD, no thyroid dysfxn (last TSH was 1.918 in 03/18/2013).  Pt was on a regimen of: - Metformin 1000 mg 2x a day - Lantus 20 units qhs - Welchol 3 tabs bid - At last visit (03/2013), we stopped Novolog 5 units bid ac and started Januvia 100 mg.  Since last visit, she called with high sugars, in the 200s >> I advised her to restart NovoLog and she did, but sugars were still high >> I called in Glipizide 5 mg bid.   She is now on: -  Metformin 1000 mg 2x a day - Lantus 20 units at bedtime - WelChol 3x625 mg tabs 2x a day - Januvia 100 mg in am - NovoLog 5 units before the 3 meals - Glipizide 5 mg in am  Pt checks her sugars  >8x a daya day and they are higher with the most recent stress (moving, mother dying): - am:  102-218, most low 100s >> 111-209 >> 115-295 - 2h after b'fast: n/c >> 125-285 >> 97-269 - before lunch: 52-149 >> 86-215 (mostly 90-140) >> 62-186 - 2h after lunch: n/c >> 72-150 >> 129-283 - before dinner: 122-225 >> 99-197 >> 134-311 - 2h after dinner: n/c >> 166 >> 176-296 - bedtime: 91-227 >> 78-209 (most <150) >> 86-199 - at night: 135-193 Has lows. Lowest sugar was 62; she has hypoglycemia awareness at 50. Highest sugar was 311.  Pt's meals are: - Breakfast: toast, bowl of cereal + 2% milk  - Lunch: soup, sometimes sandwich, vegetables - Dinner: chicken/pork/beef +  vegetable, no starch - Snacks: sugar free sweets Had DM education in 2012. Her insurance does not pay for nutrition or DM edu classes.  Exercises daily: walks, lifts weights.  - no CKD, last BUN/creatinine: 13/0.55 ion 03/18/2013. Previously: Lab Results  Component Value Date   BUN 8 04/16/2012   CREATININE 0.72 04/16/2012  On Losartan. - last set of lipids: 161/92/46/97 in 03/18/2013.  Not on a statin  - last eye exam was in 09/2012 - [place]. No DR.  - + numbness and tingling in her feet. On Neurontin. Foot exam checked by PCP: 09/18/2012.   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 She started JordanLatuda. Also: GERD, HTN, HL, Fibromyalgia.  I reviewed pt's medications, allergies, PMH, social hx, family hx and no changes required, except as mentioned above.  ROS: Constitutional: no weight gain/loss, no increased appetite anymore, + fatigue, + subjective hyperthermia, + poor sleep, + increased urination Eyes: no blurry vision, no xerophthalmia ENT: no sore throat, no nodules palpated in throat, no dysphagia/odynophagia, no hoarseness Cardiovascular: no CP/SOB/no palpitations/no leg swelling Respiratory: no cough/SOB Gastrointestinal: no N/V/D/C Musculoskeletal: + both: muscle/joint aches Skin: no rashes Neurological: no tremors/numbness/tingling/dizziness  PE: BP 122/74  Pulse 93  Temp(Src) 98.4 F (36.9 C) (Oral)  Resp 12  Wt 161 lb (73.029 kg)  SpO2 97% Body mass index is 27.62 kg/(m^2).  Wt Readings from Last 3 Encounters:  05/29/13 161 lb (73.029 kg)  04/16/13 160 lb (72.576 kg)  03/23/13 164 lb 12.8 oz (74.753 kg)   Constitutional: overweight, in NAD Eyes: PERRLA, EOMI, no exophthalmos ENT: moist mucous membranes, no thyromegaly, no cervical lymphadenopathy Cardiovascular: RRR, No MRG Respiratory: CTA B Gastrointestinal: abdomen soft, NT, ND, BS+ Musculoskeletal: no deformities, strength intact in all  4 Skin: moist, warm, no rashes Neurological: no tremor with outstretched hands, DTR normal in all 4  ASSESSMENT: 1. DM2, insulin-dependent, uncontrolled, with complications - peripheral neuropathy - gastroparesis?  PLAN:  1. Patient with long-standing, previously on basal-bolus + oral antidiabetic regimen >> sugars higher than before after starting Latuda (one SE can be elevated sugars) and being more stressed. -  at next visit, I plan to check both a HbA1c and a fructosamine, to confirm. -  I suggested to:    Patient Instructions  Continue: -  Metformin 1000 mg 2x a day - Lantus 20 units at bedtime - WelChol 3 x 625 mg tabs 2x a day - Januvia 100 mg in am >> continue until you run out Stop: - Glipizide 5 mg in am Change the Novolog doses as follows: - 5 units before a small meal - 7 units before a large meal Add the following Sliding scale: - 150-175: + 1 unit  - 176-200: + 2 units  - 201-225: + 3 units  - >225: + 4 units   For e.g.: if you prepare to eat a small breakfast and the sugar before that meal is 172, you will take: 5 units + 1 unit = 6 units  Please return in 1 month with your sugar log.  - we discussed about improving her diet >> suggested changes (see pt instr) - continue checking sugars at different times of the day - check 2-3 times a day, rotating checks - up to date with eye exams - Return to clinic in 1 mo with sugar log

## 2013-05-29 NOTE — Patient Instructions (Addendum)
Continue: -  Metformin 1000 mg 2x a day - Lantus 20 units at bedtime - WelChol 3 x 625 mg tabs 2x a day - Januvia 100 mg in am >> continue until you run out Stop: - Glipizide 5 mg in am Change the Novolog doses as follows: - 5 units before a small meal - 7 units before a large meal Add the following Sliding scale: - 150-175: + 1 unit  - 176-200: + 2 units  - 201-225: + 3 units  - >225: + 4 units   For e.g.: if you prepare to eat a small breakfast and the sugar before that meal is 172, you will take: 5 units + 1 unit = 6 units  Please return in 1 month with your sugar log.   Please consider the following ways to cut down carbs and fat and increase fiber and micronutrients in your diet:  - substitute whole grain for white bread or pasta - substitute brown rice for white rice - substitute 90-calorie flat bread pieces for slices of bread when possible - substitute sweet potatoes or yams for white potatoes - substitute humus for margarine - substitute tofu for cheese when possible - substitute almond or rice milk for regular milk (would not drink soy milk daily due to concern for soy estrogen influence on breast cancer risk) - substitute dark chocolate for other sweets when possible - substitute water - can add lemon or orange slices for taste - for diet sodas (artificial sweeteners will trick your body that you can eat sweets without getting calories and will lead you to overeating and weight gain in the long run) - do not skip breakfast or other meals (this will slow down the metabolism and will result in more weight gain over time)  - can try smoothies made from fruit and almond/rice milk in am instead of regular breakfast - can also try old-fashioned (not instant) oatmeal made with almond/rice milk in am - order the dressing on the side when eating salad at a restaurant (pour less than half of the dressing on the salad) - eat as little meat as possible - can try juicing, but  should not forget that juicing will get rid of the fiber, so would alternate with eating raw veg./fruits or drinking smoothies - use as little oil as possible, even when using olive oil - can dress a salad with a mix of balsamic vinegar and lemon juice, for e.g. - use agave nectar, stevia sugar, or regular sugar rather than artificial sweateners - steam or broil/roast veggies  - snack on veggies/fruit/nuts (unsalted, preferably) when possible, rather than processed foods - reduce or eliminate aspartame in diet (it is in diet sodas, chewing gum, etc) Read the labels!  Try to read Dr. Katherina Right book: "Program for Reversing Diabetes" for the vegan concept and other ideas for healthy eating.

## 2013-06-01 ENCOUNTER — Telehealth: Payer: Self-pay | Admitting: Internal Medicine

## 2013-06-01 ENCOUNTER — Other Ambulatory Visit: Payer: Self-pay | Admitting: *Deleted

## 2013-06-01 NOTE — Telephone Encounter (Signed)
Patient would like to know if there was any way that we can print out a list of snacks for her to eat  She does not have internet access    Thank You :)

## 2013-06-01 NOTE — Telephone Encounter (Signed)
Pt has questions regarding how to do the sliding scale for her insulin. Please advise

## 2013-06-01 NOTE — Telephone Encounter (Signed)
Please tell her to not use the 7 units before a meal, stay with the 5 units. Keep just these, stop the SSI for now since she is so confused about it. Eating main meals 5 h apart is best.

## 2013-06-01 NOTE — Telephone Encounter (Signed)
Pt called stating that her sugars have been 161 and 191 the past 2 mornings and then they've dropped to 73 and 53. Pt states she is eating ... 4 to 5 hours in between each meal. Advised pt she may need to only wait 3 to 4 hours in between. Eat a healthy snack. Please advise.

## 2013-06-02 ENCOUNTER — Telehealth: Payer: Self-pay | Admitting: Internal Medicine

## 2013-06-02 NOTE — Telephone Encounter (Signed)
Patiet states she would told take Januvia Is patient to stop or continue Patient states she has been taking latuda directions say at night but she has been taking in am  Please advise   Thank You :)

## 2013-06-02 NOTE — Telephone Encounter (Signed)
Which medication is an SSI?

## 2013-06-02 NOTE — Telephone Encounter (Signed)
Called pt and advised her per Dr Charlean Sanfilippo note; 5 units at her main meals, do not use the sliding scale. Stop Januvia once she finishes with the bottle she is on. Advised her that Dr Elvera Lennox has put in a referral for her to see the diabetic educator to help her with food choices. Pt understood.

## 2013-06-02 NOTE — Telephone Encounter (Signed)
Sliding scale of NovoLog.

## 2013-06-02 NOTE — Telephone Encounter (Signed)
Called pt and explained about the medication. Pt understood.

## 2013-06-11 ENCOUNTER — Telehealth: Payer: Self-pay | Admitting: Internal Medicine

## 2013-06-11 NOTE — Telephone Encounter (Signed)
5 u before each meal for novolog is that correct  She is done with the januvia bottle is it true she is still supposed to stop this med completely

## 2013-06-11 NOTE — Telephone Encounter (Signed)
Returned pt's call and clarified her Novolog dosage and told her to stop Januvia now that she is done. Pt understood.

## 2013-06-12 ENCOUNTER — Telehealth: Payer: Self-pay | Admitting: Internal Medicine

## 2013-06-12 NOTE — Telephone Encounter (Signed)
Called pt and advised her ok to take aleve at bedtime.

## 2013-06-12 NOTE — Telephone Encounter (Signed)
Patient states taking an Benjiman Corealieve is ok for her to take?   Please advise patient   Call back: [phone]  Thank You

## 2013-06-30 ENCOUNTER — Ambulatory Visit: Admitting: Internal Medicine

## 2013-07-16 ENCOUNTER — Ambulatory Visit

## 2013-07-16 ENCOUNTER — Other Ambulatory Visit: Payer: Self-pay | Admitting: *Deleted

## 2013-07-16 ENCOUNTER — Ambulatory Visit (INDEPENDENT_AMBULATORY_CARE_PROVIDER_SITE_OTHER): Admitting: Internal Medicine

## 2013-07-16 ENCOUNTER — Encounter: Payer: Self-pay | Admitting: Internal Medicine

## 2013-07-16 VITALS — BP 150/88 | HR 87 | Temp 98.4°F | Resp 12 | Wt 162.0 lb

## 2013-07-16 DIAGNOSIS — E1149 Type 2 diabetes mellitus with other diabetic neurological complication: Secondary | ICD-10-CM

## 2013-07-16 DIAGNOSIS — E1142 Type 2 diabetes mellitus with diabetic polyneuropathy: Secondary | ICD-10-CM

## 2013-07-16 LAB — HEMOGLOBIN A1C
HEMOGLOBIN A1C: 4.6 % (ref <5.7)
Mean Plasma Glucose: 85 mg/dL (ref <117)

## 2013-07-16 LAB — GLUCOSE, RANDOM: Glucose, Bld: 183 mg/dL — ABNORMAL HIGH (ref 70–99)

## 2013-07-16 MED ORDER — INSULIN ASPART 100 UNIT/ML FLEXPEN: 4.0000 [IU] | PEN_INJECTOR | Freq: Three times a day (TID) | SUBCUTANEOUS | Status: DC

## 2013-07-16 MED ORDER — INSULIN GLARGINE 100 UNIT/ML SOLOSTAR PEN: 18.0000 [IU] | PEN_INJECTOR | Freq: Every day | SUBCUTANEOUS | Status: DC

## 2013-07-16 MED ORDER — FREESTYLE LANCETS MISC: Status: DC

## 2013-07-16 MED ORDER — GLUCOSE BLOOD VI STRP: ORAL_STRIP | Status: DC

## 2013-07-16 NOTE — Telephone Encounter (Signed)
Pt received new Freestyle Lite meter (due to insurance). Ordering strips and lancets.

## 2013-07-16 NOTE — Progress Notes (Signed)
Patient ID: [name], female   DOB: 10-30-1955, 58 y.o.   MRN: [id number]  HPI: [name] is a 58 y.o.-year-old female, initially referred by her PCP, Dr. Assunta Found (PA: Lenise Herald), for management of DM2, dx 2005, insulin-dependent since ~2006, controlled, with complications (PN, gastroparesis?). She returns for f/u for her DM2. Last visit 1.5 mo ago.  She was recently dx with fibromyalgia by rheumatology ~1 mo ago. She was started on Gabapentin.  Previous HbA1c per records: - 02/12/2013: 4.2% - 12/19/2012: 5.2% - 09/18/2012: 6.5% Lab Results  Component Value Date   HGBA1C 4.8 04/15/2012   HGBA1C 4.8  02/11/2010  Per labs from 03/17/2013: no anemia, no increased RDW, no CKD, no thyroid dysfxn (last TSH was 1.918 in 03/18/2013).  She is now on: - Metformin 1000 mg 2x a day - Lantus 20 units at bedtime - WelChol 3 x 625 mg tabs 2x a day NovoLog: - 5 units before a small meal - 7 units before a large meal We stopped NovoLog Sliding scale - as she did not understand understand: - 150-175: + 1 unit  - 176-200: + 2 units  - 201-225: + 3 units  - >225: + 4 units  At a previous visit (03/2013), we stopped Novolog 5 units bid ac and started Januvia 100 mg, but sugars increased >> restarted NovoLog.   Pt checks her sugars 4-6 a day and they are: - am:  102-218, most low 100s >> 111-209 >> 115-295 >> 79-160 (1x 223) - 2h after b'fast: n/c >> 125-285 >> 97-269 >> 216 - before lunch: 52-149 >> 86-215 (mostly 90-140) >> 62-186 >> 50x1, 72-148 - 2h after lunch: n/c >> 72-150 >> 129-283 >> 108 - before dinner: 122-225 >> 99-197 >> 134-311 >> 96-297 (335 x 1), most <150 - 2h after dinner: n/c >> 166 >> 176-296 >> 117 - bedtime: 91-227 >> 78-209 (most <150) >> 86-199 >> 45 x 1, 83-182, 211 x1 - at night: 135-193 >> 37 x1, 110 Has lows. Lowest sugar was 37; she has hypoglycemia awareness at 50. Highest sugar was 311.  Pt's meals are: - Breakfast: toast, bowl of cereal + 2% milk   - Lunch: soup, sometimes sandwich, vegetables - Dinner: chicken/pork/beef + vegetable, no starch - Snacks: sugar free sweets Had DM education in 2012. Her insurance does not pay for nutrition or DM edu classes.  Exercises daily: walks, lifts weights.  - no CKD, last BUN/creatinine: 13/0.55 ion 03/18/2013. Previously: Lab Results  Component Value Date   BUN 8 04/16/2012   CREATININE 0.72 04/16/2012  On Losartan. - last set of lipids: 161/92/46/97 in 03/18/2013.  Not on a statin  - last eye exam was in 09/2012 - [place]. No DR.  - + numbness and tingling in her feet. On Neurontin. Foot exam checked by PCP: 09/18/2012.   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 She started Jordan. Also: GERD, HTN, HL, Fibromyalgia.  I reviewed pt's medications, allergies, PMH, social hx, family hx and no changes required, except as mentioned above.  ROS: Constitutional: no weight gain/loss, no increased appetite anymore, + fatigue Eyes: no blurry vision, no xerophthalmia ENT: no sore throat, no nodules palpated in throat, no dysphagia/odynophagia, no hoarseness Cardiovascular: no CP/SOB/no palpitations/no leg swelling Respiratory: no cough/SOB Gastrointestinal: no N/V/D/C Musculoskeletal: + both: muscle/joint aches Skin: no rashes Neurological: no tremors/numbness/tingling/dizziness  PE: BP 150/88  Pulse 87  Temp(Src) 98.4 F (36.9 C) (Oral)  Resp 12  Wt 162 lb (73.483 kg)  SpO2 96% Body mass index is 27.79 kg/(m^2).  Wt Readings from Last 3 Encounters:  07/16/13 162 lb (73.483 kg)  05/29/13 161 lb (73.029 kg)  04/16/13 160 lb (72.576 kg)   Constitutional: overweight, in NAD Eyes: PERRLA, EOMI, no exophthalmos ENT: moist mucous membranes, no thyromegaly, no cervical lymphadenopathy Cardiovascular: RRR, No MRG Respiratory: CTA B Gastrointestinal: abdomen soft, NT, ND, BS+ Musculoskeletal: no deformities, strength intact  in all 4 Skin: moist, warm, no rashes Neurological: no tremor with outstretched hands, DTR normal in all 4  ASSESSMENT: 1. DM2, insulin-dependent, uncontrolled, with complications - peripheral neuropathy - gastroparesis?  PLAN:  1. Patient with long-standing, previously on basal-bolus + oral antidiabetic regimen >> sugars improved, with few highs and few los, one at 37 and one at 45 - both after dinner (pt cannot explain these). Due to the variability, I would like to check her for DM1. If she is type 1 diabetic, will stop Metformin. Welchol is used for cholesterol lowering for her. -  We will check both a HbA1c and a fructosamine, as her HbA1c are very low -  I suggested to:  Patient Instructions  Please stop at the lab. Continue Metformin 1000 mg 2x a day  Decrease Lantus to 18 units at bedtime  Continue WelChol 3 x 625 mg tabs 2x a day  Decrease NovoLog before a meal as follows: - 4 units before dinner - 5 units before breakfast and lunch Please return in 1.5 month with your sugar log.  - continue checking sugars at different times of the day - check 4 times a day, rotating checks - up to date with eye exams - Return to clinic in 1.5 mo with sugar log   Orders Placed This Encounter  Procedures  . C-peptide  . Glucose, Random  . Glutamic acid decarboxylase auto abs  . Anti-islet cell antibody  . HgB A1c  . Fructosamine   Clinical Support on 07/16/2013  Component Date Value Ref Range Status  . Hemoglobin A1C 07/16/2013 4.6  <5.7 % Final   Comment:                                                                                                 According to the ADA Clinical Practice Recommendations for 2011, when                          HbA1c is used as a screening test:                                                       >=6.5%   Diagnostic of Diabetes Mellitus                                     (  if abnormal result is confirmed)                                                      5.7-6.4%   Increased risk of developing Diabetes Mellitus                                                     References:Diagnosis and Classification of Diabetes Mellitus,Diabetes                          Care,2011,34(Suppl 1):S62-S69 and Standards of Medical Care in                                  Diabetes - 2011,Diabetes Care,2011,34 (Suppl 1):S11-S61.                             . Mean Plasma Glucose 07/16/2013 85  <117 mg/dL Final  Office Visit on 07/16/2013  Component Date Value Ref Range Status  . C-Peptide 07/16/2013 1.29  0.80 - 3.90 ng/mL Final  . Glucose, Bld 07/16/2013 183* 70 - 99 mg/dL Final  . Glutamic Acid Decarb Ab 07/16/2013 11.3* <=1.0 U/mL Final  . Pancreatic Islet Cell Antibody 07/16/2013 5* <5 JDF Units Final   Comment:                             Laboratory Developed Test performed using a reagent labeled by                           the manufacturer as ASR Class I or ASR Class II (non-blood bank).                                                      This test(s) was developed and its performance characteristics                           have been determined by [employer],                           [place], [place]. It has not been cleared or approved by the U.[place].                           Food and Drug Administration. The FDA has determined that such                           clearance or approval is not necessary. Performance                           characteristics refer  to the analytical performance of the test.  . Hemoglobin A1C 07/16/2013 sent to [facility]  4.6 - 6.5 % Final   Glycemic Control Guidelines for People with Diabetes:Non Diabetic:  <6%Goal of Therapy: <7%Additional Action Suggested:  >8%   . 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 She still has a positive C peptide, which is great as she still has insulin secretion. For this  reason, I will continue the Metformin for now. Welchol was started by PCP for cholesterol lowering, so we can continue it. The calculated HbA1c (from fructosamine) is actually 7%.

## 2013-07-16 NOTE — Patient Instructions (Signed)
Please stop at the lab.  Continue Metformin 1000 mg 2x a day  Decrease Lantus to 18 units at bedtime  Continue WelChol 3 x 625 mg tabs 2x a day  Decrease NovoLog before a meal as follows:  - 4 units before dinner - 5 units before breakfast and lunch  Please return in 1.5 month with your sugar log.

## 2013-07-17 ENCOUNTER — Telehealth: Payer: Self-pay | Admitting: Internal Medicine

## 2013-07-17 LAB — C-PEPTIDE: C PEPTIDE: 1.29 ng/mL (ref 0.80–3.90)

## 2013-07-17 NOTE — Telephone Encounter (Signed)
They are pending. HbA1c is back and is low, as before, but all the rest are pending. Please tell her that they take time and we will call her as soon as they come back.

## 2013-07-17 NOTE — Telephone Encounter (Signed)
Pt called for lab results. Please advise.

## 2013-07-17 NOTE — Telephone Encounter (Signed)
Patient is call for lab results,

## 2013-07-20 LAB — FRUCTOSAMINE: FRUCTOSAMINE: 318 umol/L — AB (ref 190–270)

## 2013-07-20 NOTE — Telephone Encounter (Signed)
Called pt and advised her per Dr Gherghe's result note. Pt understood.  

## 2013-07-21 LAB — GLUTAMIC ACID DECARBOXYLASE AUTO ABS: Glutamic Acid Decarb Ab: 11.3 U/mL — ABNORMAL HIGH (ref <=1.0)

## 2013-07-25 LAB — ANTI-ISLET CELL ANTIBODY: PANCREATIC ISLET CELL ANTIBODY: 5 {JDF'U} — AB (ref <5)

## 2013-07-28 ENCOUNTER — Other Ambulatory Visit: Payer: Self-pay | Admitting: *Deleted

## 2013-07-28 MED ORDER — FREESTYLE LANCETS MISC: Status: DC

## 2013-08-27 ENCOUNTER — Ambulatory Visit: Admitting: Internal Medicine

## 2013-09-10 ENCOUNTER — Ambulatory Visit: Admitting: Internal Medicine

## 2013-09-24 ENCOUNTER — Encounter: Payer: Self-pay | Admitting: Internal Medicine

## 2013-09-24 ENCOUNTER — Ambulatory Visit (INDEPENDENT_AMBULATORY_CARE_PROVIDER_SITE_OTHER): Admitting: Internal Medicine

## 2013-09-24 VITALS — BP 122/78 | HR 83 | Temp 98.3°F | Resp 12 | Wt 164.6 lb

## 2013-09-24 DIAGNOSIS — E1149 Type 2 diabetes mellitus with other diabetic neurological complication: Secondary | ICD-10-CM

## 2013-09-24 DIAGNOSIS — E139 Other specified diabetes mellitus without complications: Secondary | ICD-10-CM

## 2013-09-24 DIAGNOSIS — E1142 Type 2 diabetes mellitus with diabetic polyneuropathy: Secondary | ICD-10-CM

## 2013-09-24 DIAGNOSIS — E119 Type 2 diabetes mellitus without complications: Secondary | ICD-10-CM

## 2013-09-24 NOTE — Patient Instructions (Signed)
Please continue Metformin 1000 mg 2x a day  ContinueLantus to 18 units at bedtime  Continue WelChol 3 x 625 mg tabs 2x a day  Continue NovoLog before a meal as follows: - 5 units before breakfast and lunch - 4 units before dinner Please start a Sliding scale of NovoLog: - 150-175: + 1 unit  - 176-200: + 2 units  - 201-225: + 3 units  - 226-250: + 4 units  - >250: + 5 units Please return in 2 months with your sugar log.

## 2013-09-24 NOTE — Progress Notes (Signed)
Patient ID: LASHALA LASER, female   DOB: 1955-11-14, 58 y.o.   MRN: [id number]  HPI: [name] is a 58 y.o.-year-old female, initially referred by her PCP, Dr. Assunta Found (PA: Lenise Herald), for management of LADA, dx 2005, insulin-dependent since ~2006, controlled, with complications (PN, gastroparesis?). She returns for f/u for her DM2. Last visit 1.5 mo ago.  Previous HbA1c per records: The calculated HbA1c (from fructosamine) is actually 7%. 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 She still has a positive C peptide >> still has insulin secretion. For this reason, we continued the Metformin for now. Welchol was started by PCP for cholesterol lowering, so we continued this, also.  She is now on: Metformin 1000 mg 2x a day  Lantus 18 units at bedtime  WelChol 3 x 625 mg tabs 2x a day  NovoLog before a meal as follows: - 4 units before dinner - 5 units before breakfast and lunch At a previous visit (03/2013), we stopped Novolog 5 units bid ac and started Januvia 100 mg, but sugars increased >> restarted NovoLog.  Pt checks her sugars 4-6 a day and they are: - am:  102-218, most low 100s >> 111-209 >> 115-295 >> 79-160 (1x 223) >>89-165 (184, 175, 270) - 2h after b'fast: n/c >> 125-285 >> 97-269 >> 216 >> n/c - before lunch: 52-149 >> 86-215 (mostly 90-140) >> 62-186 >> 50x1, 72-148 >> 53, 61x2, 118-155, 179, 203 - 2h after lunch: n/c >> 72-150 >> 129-283 >> 108 >>146, 169 - before dinner: 122-225 >> 99-197 >> 134-311 >> 96-297  (335 x 1), most <150 >> 65, 72, 117-161, 200s x3 - 2h after dinner: n/c >> 166 >> 176-296 >> 117 >> n/c - bedtime: 91-227 >> 78-209 (most <150) >> 86-199 >> 45 x 1, 83-182, 211 x1 >> 114-168, 185 - at night: 135-193 >> 37 x1, 110 >> n/c Has lows. Lowest sugar was 53; she has hypoglycemia awareness at 50. Highest sugar was 311 >> 240s.  Pt's meals are: - Breakfast: toast, bowl of cereal + 2% milk  - Lunch: soup, sometimes sandwich, vegetables - Dinner: chicken/pork/beef + vegetable, no starch - Snacks: sugar free sweets Had DM education in 2012. Her insurance does not pay for nutrition or DM edu classes.  Exercises daily: walks, lifts weights.  - no CKD, last BUN/creatinine:  13/0.55 in 03/18/2013.  Previously: Lab Results  Component Value Date   BUN 8 04/16/2012   CREATININE 0.72 04/16/2012  On Losartan. - last set of lipids: 161/92/46/97 in 03/18/2013.  Not on a statin  - last eye exam was in 09/2012 - [place]. No DR. Next: 11/05/2013. - + numbness and tingling in her feet. On Neurontin. Foot exam checked by PCP: 09/18/2012. She is going to her podiatrist [facility]) next week.  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 She also has a  dx of Parkinson ds. She has bipolar ds. She started Jordan. Also: GERD, HTN, HL, Fibromyalgia >> on Gabapentin.  I reviewed pt's medications, allergies, PMH, social hx, family hx and no changes required, except as mentioned above.  ROS: Constitutional: no weight gain/loss, no increased appetite anymore, + fatigue Eyes: no blurry vision, no xerophthalmia ENT: no sore throat, no nodules palpated in throat, no dysphagia/odynophagia, no hoarseness Cardiovascular: no CP/SOB/no palpitations/no leg swelling Respiratory: no cough/SOB Gastrointestinal: no N/V/D/C Musculoskeletal: + both: muscle/joint aches Skin: no rashes Neurological: no tremors/numbness/tingling/dizziness  PE: BP 122/78  Pulse 83  Temp(Src) 98.3 F (36.8 C)  (Oral)  Resp 12  Wt 164 lb 9.6 oz (74.662 kg)  SpO2 97% Body mass index is 28.24 kg/(m^2).  Wt Readings from Last 3 Encounters:  09/24/13 164 lb 9.6 oz (74.662 kg)  07/16/13 162 lb (73.483 kg)  05/29/13 161 lb (73.029 kg)   Constitutional: overweight, in NAD Eyes: PERRLA, EOMI, no exophthalmos ENT: moist mucous membranes, no thyromegaly, no cervical lymphadenopathy Cardiovascular: RRR, No MRG Respiratory: CTA B Gastrointestinal: abdomen soft, NT, ND, BS+ Musculoskeletal: no deformities, strength intact in all 4 Skin: moist, warm, no rashes Neurological: no tremor with outstretched hands, DTR normal in all 4  ASSESSMENT: 1. LADA, insulin-dependent, uncontrolled, with complications - peripheral neuropathy - gastroparesis?  PLAN:  1. Patient with long-standing DM, now dx'ed as LADA, on basal-bolus + oral antidiabetic regimen, with few highs and few lows, but overall reasonable control. She will start limiting her sugary snacks intake. -  Fructosamine is the best check to check for her as her HbA1c levels are very low -  I suggested to add a Sliding scale insulin:  Patient Instructions  Please continue Metformin 1000 mg 2x a day  ContinueLantus to 18 units at bedtime  Continue WelChol 3 x 625 mg tabs 2x a day  Continue NovoLog before a meal as follows: - 5 units before breakfast and lunch - 4 units before dinner Please start a Sliding scale of NovoLog: - 150-175: + 1 unit  - 176-200: + 2 units  - 201-225: + 3 units  - 226-250: + 4 units  - >250: + 5 units Please return in 2 months with your sugar log.  - continue checking sugars at different times of the day - check 4 times a day, rotating checks - up to date with eye exams - she has a flu vaccine this fall; also a shingles and a PNA vaccine - Return to clinic in 2 mo with sugar log

## 2013-10-07 ENCOUNTER — Telehealth: Payer: Self-pay | Admitting: Internal Medicine

## 2013-10-07 NOTE — Telephone Encounter (Signed)
Returned pt's call and advised her ok to eat small meals and a snack. Advised pt to pay close attention to her sugar levels and what she is eating. Pt understood.

## 2013-10-07 NOTE — Telephone Encounter (Signed)
Her blood sugars are bouncing around   Is it ok for her to eat two small meals and a snack in AM she is very nauseous

## 2013-10-13 ENCOUNTER — Telehealth: Payer: Self-pay | Admitting: Internal Medicine

## 2013-10-13 NOTE — Telephone Encounter (Signed)
I would like to know that also. Unfortunately, without further info about sugars at bedtime, I cannot tell.  We can try to do this for now, if there are no lows: Increase Lantus to 20 units at bedtime  Continue WelChol 3 x 625 mg tabs 2x a day  NovoLog before a meal:  - 5 units before breakfast and lunch  - 4 units before dinner >> increase to 5 units Continue Sliding scale of NovoLog:  - 150-175: + 1 unit  - 176-200: + 2 units  - 201-225: + 3 units  - 226-250: + 4 units  - >250: + 5 units Please advise her to call us in few days if sugars not better, but I need ranges for sugars before and after meals.

## 2013-10-13 NOTE — Telephone Encounter (Signed)
Forwarded

## 2013-10-13 NOTE — Telephone Encounter (Signed)
Patient would like to know why her readings are over 200 every morning  Novolog  Lantus  Metformin    Please advise  Thank you

## 2013-10-13 NOTE — Telephone Encounter (Signed)
Please see below,  °

## 2013-10-13 NOTE — Telephone Encounter (Signed)
Noted, left instructions on patients vm

## 2013-10-29 ENCOUNTER — Ambulatory Visit (INDEPENDENT_AMBULATORY_CARE_PROVIDER_SITE_OTHER): Admitting: Internal Medicine

## 2013-10-29 ENCOUNTER — Encounter: Payer: Self-pay | Admitting: Internal Medicine

## 2013-10-29 VITALS — BP 136/80 | HR 100 | Temp 98.5°F | Resp 12 | Wt 167.8 lb

## 2013-10-29 DIAGNOSIS — E139 Other specified diabetes mellitus without complications: Secondary | ICD-10-CM

## 2013-10-29 MED ORDER — INSULIN ASPART 100 UNIT/ML FLEXPEN: 5.0000 [IU] | PEN_INJECTOR | Freq: Three times a day (TID) | SUBCUTANEOUS | Status: DC

## 2013-10-29 MED ORDER — INSULIN GLARGINE 100 UNIT/ML SOLOSTAR PEN: 20.0000 [IU] | PEN_INJECTOR | Freq: Every day | SUBCUTANEOUS | Status: DC

## 2013-10-29 NOTE — Patient Instructions (Signed)
Please continue: Metformin 1000 mg 2x a day  WelChol 3 x 625 mg tabs 2x a day  Increase Lantus to 20 units at bedtime  Change NovoLog before a meal as follows: - 3 units before a sweet snack - 5 units before a small meal - 6 units before a regular meal  - 7 units before a large meal  Continue Sliding scale of NovoLog: - 150-175: + 1 unit  - 176-200: + 2 units  - 201-225: + 3 units  - 226-250: + 4 units  - >250: + 5 unit Please stop at the lab.  Please return in 3 months with your sugar log.

## 2013-10-29 NOTE — Progress Notes (Signed)
Patient ID: [name], female   DOB: 05/26/1955, 58 y.o.   MRN: [id number]  HPI: Sabrina ComptonBarbara L [name] is a 58 y.o.-year-old female, initially referred by her PCP, Dr. Assunta FoundJohn [name] (PA: Lenise HeraldBenjamin [name]), for management of LADA, dx 2005, insulin-dependent since ~2006, controlled, with complications (PN, gastroparesis?). She returns for f/u for her DM2. Last visit 1 mo ago.  Previous HbA1c per records: The calculated HbA1c (from fructosamine) is actually 7%. Lab Results  Component Value Date   HGBA1C 4.6 07/16/2013   HGBA1C sent to [facility] 07/16/2013   HGBA1C 4.8 04/15/2012  - 02/12/2013: 4.2% - 12/19/2012: 5.2% - 09/18/2012: 6.5%  She is now on: Metformin 1000 mg 2x a day  WelChol 3 x 625 mg tabs 2x a day  Lantus 18 units at bedtime  NovoLog before a meal as follows: - 4 units before dinner - 5 units before breakfast and lunch Sliding scale of NovoLog: - 150-175: + 1 unit  - 176-200: + 2 units  - 201-225: + 3 units  - 226-250: + 4 units  - >250: + 5 unit  Pt checks her sugars 4-6 a day and they are: - am:  102-218, most low 100s >> 111-209 >> 115-295 >> 79-160 (1x 223) >>89-165 (184, 175, 270) >> 149-240, 280, 333 - 2h after b'fast: n/c >> 125-285 >> 97-269 >> 216 >> n/c >> 193-259, 299 - before lunch: 52-149 >> 86-215 (mostly 90-140) >> 62-186 >> 50x1, 72-148 >> 53, 61x2, 118-155, 179, 203 >> 68, 111-231 - 2h after lunch: n/c >> 72-150 >> 129-283 >> 108 >>146, 169 >> 98-145, 258 - before dinner: 122-225 >> 99-197 >> 134-311 >> 96-297 (335 x 1), most <150 >> 65, 72, 117-161, 200s x3 >> 154-273, 324, 468 - 2h after dinner: n/c >> 166 >> 176-296 >> 117 >> n/c >> 142 - bedtime: 91-227 >> 78-209 (most <150) >> 86-199 >> 45 x 1, 83-182, 211 x1 >> 114-168, 185 >> 65, 71, 169-378, 441 - at night: 135-193 >> 37 x1, 110 >> n/c Has lows. Lowest sugar was 53; she has hypoglycemia awareness at 50. Highest sugar was 311 >> 240s.  Pt's meals are: - Breakfast: toast, bowl of cereal + 2% milk  -  Lunch: soup, sometimes sandwich, vegetables - Dinner: chicken/pork/beef + vegetable, no starch - Snacks: sugar free sweets Had DM education in 2012. Her insurance does not pay for nutrition or DM edu classes.  Exercises daily: walks, lifts weights.  - no CKD, last BUN/creatinine:  13/0.55 in 03/18/2013.  Previously: Lab Results  Component Value Date   BUN 8 04/16/2012   CREATININE 0.72 04/16/2012  On Losartan. - last set of lipids: 161/92/46/97 in 03/18/2013.  Not on a statin  - last eye exam was in 09/2012 - [place]. No DR. Next: 11/05/2013. - + numbness and tingling in her feet. On Neurontin.  Foot exam checked by PCP: 10/2012. She is seeing her podiatrist [facility]  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 She recently stopped JordanLatuda. Also: GERD, HTN, HL, Fibromyalgia >> on Gabapentin.  I reviewed pt's medications, allergies, PMH, social hx, family hx and no changes required, except as mentioned above.  ROS: Constitutional: + weight gain, no increased appetite anymore, + fatigue Eyes: no blurry vision, no xerophthalmia ENT: no sore throat, no nodules palpated in throat, no dysphagia/odynophagia, no hoarseness Cardiovascular: no CP/SOB/no palpitations/no leg swelling Respiratory: no cough/SOB Gastrointestinal: +  N/no V/D/C Musculoskeletal: + both: muscle/joint aches Skin: no rashes, + hair loss Neurological: no tremors/numbness/tingling/dizziness  PE: BP 136/80  Pulse 100  Temp(Src) 98.5 F (36.9 C) (Oral)  Resp 12  Wt 167 lb 12.8 oz (76.114 kg)  SpO2 95% Body mass index is 28.79 kg/(m^2).  Wt Readings from Last 3 Encounters:  10/29/13 167 lb 12.8 oz (76.114 kg)  09/24/13 164 lb 9.6 oz (74.662 kg)  07/16/13 162 lb (73.483 kg)   Constitutional: overweight, in NAD Eyes: PERRLA, EOMI, no exophthalmos ENT: moist mucous membranes, no thyromegaly, no cervical lymphadenopathy Cardiovascular:  RRR, No MRG Respiratory: CTA B Gastrointestinal: abdomen soft, NT, ND, BS+ Musculoskeletal: no deformities, strength intact in all 4 Skin: moist, warm, no rashes Neurological: no tremor with outstretched hands, DTR normal in all 4  ASSESSMENT: 1. LADA, insulin-dependent, uncontrolled, with complications - peripheral neuropathy - gastroparesis?  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 She still has a positive C peptide >> still has insulin secretion. For this reason, we continued the Metformin for now. Welchol was started by PCP for cholesterol lowering, so we continued this, also.  PLAN:  1. Patient with long-standing DM, now dx'ed as LADA, on basal-bolus + oral antidiabetic regimen, with worsened control lately 2/2 depression >> now improving. She appears dehydrated >> advised to drink more water.  -  I explained that Fructosamine is the best check to check for her as her HbA1c levels are very low. We reviewed the latest fructosamine  - HbA1c calculated at 7%. -  I suggested to:  Patient Instructions  Please continue: Metformin 1000 mg 2x a day  WelChol 3 x 625 mg tabs 2x a day  Increase Lantus to 20 units at bedtime  Change NovoLog before a meal as follows: - 3 units before a sweet snack - 5 units before a small meal - 6 units before a regular meal  - 7 units before a large meal  Continue Sliding scale of NovoLog: - 150-175: + 1 unit  - 176-200: + 2 units  - 201-225: + 3 units  - 226-250: + 4 units  - >250: + 5 unit Please stop at the lab.  Please return in 3 months with your sugar log.   - continue checking sugars at different times of the day - check 4 times a day, rotating checks - up to date  with eye exams - she has a flu vaccine this fall; also a shingles and a PNA vaccine - check fructosamine today - Return to clinic in 3 mo with sugar log    Office Visit on 10/29/2013  Component Date Value Ref Range Status  . Fructosamine 10/29/2013 418* 190 - 270 umol/L Final    HbA1c higher (calculated at 8.7%). See plan above.

## 2013-10-31 LAB — FRUCTOSAMINE: Fructosamine: 418 umol/L — ABNORMAL HIGH (ref 190–270)

## 2013-11-02 ENCOUNTER — Encounter: Payer: Self-pay | Admitting: Internal Medicine

## 2013-11-02 NOTE — Telephone Encounter (Signed)
Please see note below and advise  

## 2013-11-02 NOTE — Telephone Encounter (Signed)
Shew will need to check sugars often to make sure not very high or very low. Stay hydrated! She may need to take insulin (mealtime) even if she is not eating if sugars are high.

## 2013-11-02 NOTE — Telephone Encounter (Signed)
Patient stated that she has and upset stomach and is having a hard time eating, what can she eat to keep her sugar levels normal.  Please advise

## 2013-11-03 NOTE — Telephone Encounter (Signed)
Called pt and advised her per Dr Gherghe's note.  

## 2013-11-06 ENCOUNTER — Telehealth: Payer: Self-pay | Admitting: Internal Medicine

## 2013-11-06 NOTE — Telephone Encounter (Signed)
Called and spoke with pt and pt is aware.  Pt repeated information and verbalized understanding.

## 2013-11-06 NOTE — Telephone Encounter (Signed)
Please advise her to check her sugars in another hour after the last check, and if not lower than 200, she may inject 2 units of NovoLog. Starting tomorrow, she needs to increase her NovoLog doses as follows: NovoLog before a meal as follows: - 5 units before dinner - 6 units before breakfast and lunch Please call us back next week if she continues to experience problems with high sugars.

## 2013-11-06 NOTE — Telephone Encounter (Signed)
No, she should continue the sliding scale as advised.

## 2013-11-06 NOTE — Telephone Encounter (Signed)
Today she checked BS right before breakfast it was 144, she ate life cereal with berries, she took 5 u of insulin.  2 hrs later BS was 259 she has not eaten When is she supposed to take more insulin?

## 2013-11-06 NOTE — Telephone Encounter (Signed)
Patient has questions about Insulin dosage. Please advise

## 2013-11-06 NOTE — Telephone Encounter (Signed)
Called and spoke with pt and pt states at noon her blood sure was 158.  Pt verbalized understanding that she will start the new Novolog changes tomorrow.  Pt would like to know if she should do away with the sliding scale you had previously given her.  Pls advise.

## 2013-11-06 NOTE — Telephone Encounter (Signed)
Called and spoke with pt and pt is aware.  

## 2013-11-13 ENCOUNTER — Other Ambulatory Visit: Payer: Self-pay | Admitting: *Deleted

## 2013-11-13 ENCOUNTER — Telehealth: Payer: Self-pay | Admitting: Endocrinology

## 2013-11-13 MED ORDER — GLUCOSE BLOOD VI STRP: ORAL_STRIP | Status: DC

## 2013-11-13 NOTE — Telephone Encounter (Signed)
Called pt and answered her question. Refills sent.

## 2013-11-13 NOTE — Telephone Encounter (Signed)
Returned pt's call and answered her question.

## 2013-11-13 NOTE — Telephone Encounter (Signed)
Patient stated that she is confused about the instructions she has been given, she still doesn't understand. And she also needs some test strips.

## 2013-11-18 ENCOUNTER — Telehealth: Payer: Self-pay | Admitting: Internal Medicine

## 2013-11-18 NOTE — Telephone Encounter (Signed)
She had spilled proteins into her urine. She was very upset and crying thinking her kidney's are shutting down. She wanted to know if we have received her labs yet? Advised her that we have not. But we will give it a couple of hours and call them to see if they can fax them to Dr Elvera LennoxGherghe. Pt ok for now.

## 2013-11-18 NOTE — Telephone Encounter (Signed)
Please call pt ASAP and let her know whether we have received her blood work from her MD yesterday. She is very upset and does not understand.

## 2013-11-18 NOTE — Telephone Encounter (Signed)
Returned pt's call. Pt stated that her MD's office (Dr Marga HootsGolding/[facility] [place]) called her and advised her that

## 2013-11-19 ENCOUNTER — Encounter: Payer: Self-pay | Admitting: Internal Medicine

## 2013-11-19 NOTE — Progress Notes (Signed)
Received labs from 11/17/2013: - ACR 29.4 - CBC with differential normal, except eosinophiles 6% (0-5) - CMP normal, with a BUN/creatinine ratio of 13/0.55, random glucose 73, GFR >89 - Lipids: 161/92/46/97 - TSH 1.918 - Urinalysis normal, without ketones or glucose

## 2013-11-19 NOTE — Telephone Encounter (Signed)
Called patient back and explained the results of her urinary albumin to creatinine ratio. I explained that this can be slightly elevated from many reasons not necessarily that her kidneys are "shutting down". It is reassuring that her kidney function is normal. We will need to keep an eye on her albumin to creatinine ratio, but I explained that there is no reason to panic for now. Patient understands and will try to stay hydrated and watch her blood sugars. I will see her back in January.

## 2013-11-20 ENCOUNTER — Ambulatory Visit: Admitting: Internal Medicine

## 2013-11-30 ENCOUNTER — Telehealth: Payer: Self-pay | Admitting: Internal Medicine

## 2013-11-30 NOTE — Telephone Encounter (Signed)
Patient states that she has of concern with her  Nov 15-21 went over 200 4x times  Nov 22-28 went over 200 14x over 7 days   She has been going on slide and scale   No emergency, just wanted to give readings    Please advise   Thank you

## 2013-12-01 NOTE — Telephone Encounter (Signed)
[name], can you get the exact sugars and the relationship with the meals from her? Thank you, C

## 2013-12-01 NOTE — Telephone Encounter (Signed)
Pt requesting a call back tomorrow regarding her blood sugar readings please

## 2013-12-03 NOTE — Telephone Encounter (Signed)
Called pt and advised her per Dr Santo HeldGheghe's note. Pt understood. Pt will call back next week to let us know how her sugar levels are. Be advised.

## 2013-12-03 NOTE — Telephone Encounter (Signed)
Please continue: Metformin 1000 mg 2x a day  WelChol 3 x 625 mg tabs 2x a day  Continue Lantus to 20 units at bedtime  Increase NovoLog before a meal as follows (meal insulin increased by 1 unit): - 3 units before a sweet snack - 6 units before a small meal - 7 units before a regular meal  - 8 units before a large meal  Continue Sliding scale of NovoLog: - 150-175: + 1 unit  - 176-200: + 2 units  - 201-225: + 3 units  - 226-250: + 4 units  - >250: + 5 unit

## 2013-12-03 NOTE — Telephone Encounter (Signed)
Spoke with pt concerning blood sugars. Pt stated that her blood sugars have been the highest in the AM before breakfast, over 200. She has had 3x that it was over 200 after lunch and 4x that it was over 200 at bedtime. Please advise.

## 2013-12-04 ENCOUNTER — Telehealth: Payer: Self-pay | Admitting: Internal Medicine

## 2013-12-04 NOTE — Telephone Encounter (Signed)
Called pt back. She just wanted to confirm her units and sliding scale.

## 2013-12-04 NOTE — Telephone Encounter (Signed)
Patient asked if you would call her

## 2013-12-07 ENCOUNTER — Telehealth: Payer: Self-pay | Admitting: Internal Medicine

## 2013-12-07 NOTE — Telephone Encounter (Signed)
I need more details >> sugar values and exactly how much insulin she is taking.

## 2013-12-07 NOTE — Telephone Encounter (Signed)
Please read note below and help! Thank you.

## 2013-12-07 NOTE — Telephone Encounter (Signed)
Patient states that the new schedule Dr. Elvera LennoxGherghe put her on is not working   Please advise patient please   Her blood sugars are still off This is making her nervous; also she is not using the insulin as directed due to not eating   Please please advise patient    Thank you

## 2013-12-08 NOTE — Telephone Encounter (Signed)
Returned pt's call.  Mon. At 8:30 am her bg was 193 before breakfast. (did not eat, drank a boost protein drink for diabetics). At 2:30 pm her bg was 154. (did not eat lunch). Before dinner her bg was 91. (for dinner she ate a salmon patty, baked beans and 2 cornbread muffins-jiffy). Her bg was 232 this morning. Pt states she is doing the sliding scale. She is concerned about her blood sugars going up and down. Please advise.

## 2013-12-08 NOTE — Telephone Encounter (Signed)
Called pt and advised her per Dr Gherghe's result note. Pt understood.  

## 2013-12-08 NOTE — Telephone Encounter (Signed)
Please continue: Metformin 1000 mg 2x a day  WelChol 3 x 625 mg tabs 2x a day  Increase Lantus from 20 to 24 units at bedtime  Continue NovoLog before a meal as follows: - 3 units before a sweet snack - 6 units before a small meal - 7 units before a regular meal  - 8 units before a large meal  Continue Sliding scale of NovoLog: - 150-175: + 1 unit  - 176-200: + 2 units  - 201-225: + 3 units  - 226-250: + 4 units  - >250: + 5 unit  Sugars usually fluctuate in diabetes, please reassure her.

## 2013-12-17 NOTE — Telephone Encounter (Signed)
Patient haven't been able to eat in the past two days, what should she eat. Please advise

## 2013-12-17 NOTE — Telephone Encounter (Signed)
Please read note below and advise.  

## 2013-12-17 NOTE — Telephone Encounter (Signed)
Eat only what she can tolerate, if skips meal, can try Glucerna; stay hydrated.

## 2013-12-17 NOTE — Telephone Encounter (Signed)
Called pt advised her per Dr Santo HeldGheghe's note. Pt understood.

## 2013-12-28 ENCOUNTER — Other Ambulatory Visit: Payer: Self-pay | Admitting: Internal Medicine

## 2013-12-29 ENCOUNTER — Other Ambulatory Visit: Payer: Self-pay | Admitting: *Deleted

## 2013-12-29 MED ORDER — INSULIN ASPART 100 UNIT/ML FLEXPEN: 5.0000 [IU] | PEN_INJECTOR | Freq: Three times a day (TID) | SUBCUTANEOUS | Status: DC

## 2014-01-04 ENCOUNTER — Ambulatory Visit (INDEPENDENT_AMBULATORY_CARE_PROVIDER_SITE_OTHER): Admitting: Internal Medicine

## 2014-01-04 ENCOUNTER — Encounter: Payer: Self-pay | Admitting: Internal Medicine

## 2014-01-04 VITALS — BP 122/80 | HR 104 | Temp 98.2°F | Resp 12 | Wt 164.6 lb

## 2014-01-04 DIAGNOSIS — R112 Nausea with vomiting, unspecified: Secondary | ICD-10-CM

## 2014-01-04 DIAGNOSIS — E139 Other specified diabetes mellitus without complications: Secondary | ICD-10-CM

## 2014-01-04 MED ORDER — PROMETHAZINE HCL 12.5 MG PO TABS: 12.5000 mg | ORAL_TABLET | Freq: Three times a day (TID) | ORAL | Status: DC | PRN

## 2014-01-04 NOTE — Progress Notes (Signed)
Patient ID: [name], female   DOB: Feb 11, 1955, 59 y.o.   MRN: [id number]  HPI: [name] is a 59 y.o.-year-old female, initially referred by her PCP, Dr. Assunta Found (PA: Lenise Herald), for management of LADA, dx 2005, insulin-dependent since ~2006, controlled, with complications (PN, gastroparesis?). She returns for f/u for her DM2. Last visit 2.5 mo ago.  She has diarrhea, nausea, sweating, after decreasing the Xanax 8-9 days ago (she took it by mistake 2x a day at that time).  Previous HbA1c per records: Office Visit on 10/29/2013  Component Date Value Ref Range Status  . Fructosamine 10/29/2013 418* 190 - 270 umol/L Final   Lab Results  Component Value Date   HGBA1C 4.6 07/16/2013   HGBA1C sent to [facility] 07/16/2013   HGBA1C 4.8 04/15/2012  - 02/12/2013: 4.2% - 12/19/2012: 5.2% - 09/18/2012: 6.5%  She is now on: Metformin 1000 mg 2x a day  WelChol 3 x 625 mg tabs 2x a day  Lantus from 20 to 24 units at bedtime >> stopped several days ago b/c nausea NovoLog before a meal as follows: >> off it since nausea for last several days >> sugars 200s - 3 units before a sweet snack - 6 units before a small meal - 7 units before a regular meal  - 8 units before a large meal  Sliding scale of NovoLog: - 150-175: + 1 unit  - 176-200: + 2 units  - 201-225: + 3 units  - 226-250: + 4 units  - >250: + 5 unit  She is not eating well due to nausea >> crackers and water.  Pt checks her sugars 4-6 a day and they are better! (except last week when she was off her insulins - sugars 200s then): - am:  79-160 (1x 223) >>89-165 (184, 175, 270) >> 149-240, 280, 333 >> 86-188, 262 - 2h after b'fast: n/c >> 125-285 >> 97-269 >> 216 >> n/c >> 193-259, 299 >> 89-131 - before lunch: 50x1, 72-148 >> 53, 61x2, 118-155, 179, 203 >> 68, 111-231 >> 49, 57, 83-152, 188 - 2h after lunch: n/c >> 72-150 >> 129-283 >> 108 >>146, 169 >> 98-145, 258 >> 74-110, 198 - before dinner: 65, 72,  117-161, 200s x3 >> 154-273, 324, 468 >> 44, 49, 61-144 - 2h after dinner: n/c >> 166 >> 176-296 >> 117 >> n/c >> 142 >> 101, 149 - bedtime: 45 x 1, 83-182, 211 x1 >> 114-168, 185 >> 65, 71, 169-378, 441 >> 101-188, 262 - at night: 135-193 >> 37 x1, 110 >> n/c Has lows. Lowest sugar was 53; she has hypoglycemia awareness at 50. Highest sugar was 311 >> 240s.  Pt's meals are: - Breakfast: toast, bowl of cereal + 2% milk  - Lunch: soup, sometimes sandwich, vegetables - Dinner: chicken/pork/beef + vegetable, no starch - Snacks: sugar free sweets Had DM education in 2012. Her insurance does not pay for nutrition or DM edu classes.  Reviewed labs from 11/17/2013: - ACR 29.4 - CBC with differential normal, except eosinophiles 6% (0-5) - CMP normal, with a BUN/creatinine ratio of 13/0.55, random glucose 73, GFR >89 - Lipids: 161/92/46/97 - TSH 1.918 - Urinalysis normal, without ketones or glucose  - no CKD, last BUN/creatinine:  13/0.55 in 03/18/2013.  Previously: Lab Results  Component Value Date   BUN 8 04/16/2012   CREATININE 0.72 04/16/2012  On Losartan. - last set of lipids: 161/92/46/97 in 03/18/2013.  Not on a statin  - last eye exam  was in 09/2012 - [place]. No DR. Next: 11/05/2013. - + numbness and tingling in her feet. On Neurontin.  Foot exam checked by PCP: 10/2012. She is seeing her podiatrist [facility]).  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 She recently stopped Jordan. Also: GERD, HTN, HL, Fibromyalgia >> on Gabapentin.  I reviewed pt's medications, allergies, PMH, social hx, family hx, and changes were documented in the history of present illness. Otherwise, unchanged from my initial visit note. She stopped Prozac and Xanax.   ROS: Constitutional: + weight loss, no increased appetite anymore, + fatigue, + poor sleep Eyes: no blurry vision, no xerophthalmia ENT: no sore throat, no  nodules palpated in throat, no dysphagia/odynophagia, no hoarseness Cardiovascular: no CP/SOB/no palpitations/no leg swelling Respiratory: no cough/SOB Gastrointestinal: + N/+ V/+ D/no C Musculoskeletal: no muscle/joint aches Skin: no rashes Neurological: no tremors/numbness/tingling/dizziness  PE: BP 122/80 mmHg  Pulse 104  Temp(Src) 98.2 F (36.8 C) (Oral)  Resp 12  Wt 164 lb 9.6 oz (74.662 kg)  SpO2 96% Body mass index is 28.24 kg/(m^2).  Wt Readings from Last 3 Encounters:  01/04/14 164 lb 9.6 oz (74.662 kg)  10/29/13 167 lb 12.8 oz (76.114 kg)  09/24/13 164 lb 9.6 oz (74.662 kg)   Constitutional: overweight, in distress b/c feeling nauseated Eyes: PERRLA, EOMI, no exophthalmos ENT: moist mucous membranes, no thyromegaly, no cervical lymphadenopathy Cardiovascular: RRR, No MRG Respiratory: CTA B Gastrointestinal: abdomen soft, NT, ND, BS+ Musculoskeletal: no deformities, strength intact in all 4 Skin: moist, warm, no rashes Neurological: no tremor with outstretched hands, DTR normal in all 4  ASSESSMENT: 1. LADA, insulin-dependent, uncontrolled, with complications - peripheral neuropathy - gastroparesis?  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 She still has a positive C peptide >> still has insulin secretion. For this reason, we continued the Metformin for now. Welchol was started by PCP for cholesterol lowering, so we continued this, also.  2. Nausea  PLAN:  1. Patient with long-standing DM, now dx'ed as LADA, on basal-bolus + oral antidiabetic regimen, with improved control lately! In last week sugars worse as she has been off her insulins! >> advised to restart at lower doses since she  cannot eat well 2/2 nausea. She appears dehydrated >> advised to drink more water.  -  I suggested to:  Patient Instructions  Please continue: Metformin 1000 mg 2x a day  WelChol 3 x 625 mg tabs 2x a day  Lantus 24 units at bedtime >> for now restart at 15 units at bedtime until you feel better NovoLog before a meal as follows:  - 3 units before a sweet snack >> use only this dose for now until you feel better - 6 units before a small meal - 7 units before a regular meal  - 8 units before a large meal  Sliding scale of NovoLog: - 150-175: + 1 unit  - 176-200: + 2 units  - 201-225: + 3 units  - 226-250: + 4 units  - >250: +  5 unit  If you do not feel better in 2 days, please schedule another appt with Dr. Phillips Odor.  Please return in 1.5 month with your sugar log.   - discussed about sick day rules  - continue checking sugars at different times of the day - check 4 times a day, rotating checks - up to date with eye exams - she has a flu vaccine this fall; also a shingles and a PNA vaccine - Return to clinic in 1.5 mo with sugar log   2. Nausea - possible benzodiazepine w/al effect, but now 9 days after she stopped Xanax - given a Rx for Phenergan, but needs to see PCP if not better in 2 days

## 2014-01-04 NOTE — Patient Instructions (Signed)
Please continue: Metformin 1000 mg 2x a day  WelChol 3 x 625 mg tabs 2x a day  Lantus 24 units at bedtime >> for now restart at 15 units at bedtime until you feel better NovoLog before a meal as follows:  - 3 units before a sweet snack >> use only this dose for now until you feel better - 6 units before a small meal - 7 units before a regular meal  - 8 units before a large meal  Sliding scale of NovoLog: - 150-175: + 1 unit  - 176-200: + 2 units  - 201-225: + 3 units  - 226-250: + 4 units  - >250: + 5 unit  If you do not feel better in 2 days, please schedule another appt with Dr. Phillips Odor.  Please return in 1.5 month with your sugar log.   Diabetes and Sick Day Management Blood sugar (glucose) can be more difficult to control when you are sick. Colds, fever, flu, nausea, vomiting, and diarrhea are all examples of common illnesses that can cause problems for people with diabetes. Loss of body fluids (dehydration) from fever, vomiting, diarrhea, infection, and the stress of a sickness can all cause blood glucose levels to increase. Because of this, it is very important to take your diabetes medicines and to eat some form of carbohydrate food when you are sick. Liquid or soft foods are often tolerated, and they help to replace fluids. HOME CARE INSTRUCTIONS These main guidelines are intended for managing a short-term (24 hours or less) sickness:  Take your usual dose of insulin or oral diabetes medicine. An exception would be if you take any form of metformin. If you cannot eat or drink, you can become dehydrated and should not take this medicine.  Continue to take your insulin even if you are unable to eat solid foods or are vomiting. Your insulin dose may stay the same, or it may need to be increased when you are sick.  You will need to test your blood glucose more often, generally every 2-4 hours. If you have type 1 diabetes, test your urine for ketones every 4 hours. If you have  type 2 diabetes, test your urine for ketones as directed by your health care provider.  Eat some form of food that contains carbohydrates. The carbohydrates can be in solid or liquid form. You should eat 45-50 g of carbohydrates every 3-4 hours.  Replace fluids if you have a fever, vomit, or have diarrhea. Ask your health care provider for specific rehydration instructions.  Watch carefully for the signs of ketoacidosis if you have type 1 diabetes. Call your health care provider if any of the following symptoms are present, especially in children:  Moderate to large ketones in the urine along with a high blood glucose level.  Severe nausea.  Vomiting.  Diarrhea.  Abdominal pain.  Rapid breathing.  Drink extra liquids that do not contain sugar such as water.  Be careful with over-the-counter medicines. Read the labels. They may contain sugar or types of sugars that can increase your blood glucose level. Food Choices for Illness All of the food choices below contain about 15 g of carbohydrates. Plan ahead and keep some of these foods around.    to  cup carbonated beverage containing sugar. Carbonated beverages will usually be better tolerated if they are opened and left at room temperature for a few minutes.   of a twin frozen ice pop.   cup regular gelatin.   cup  juice.   cup ice cream or frozen yogurt.   cup cooked cereal.   cup sherbet.  1 cup clear broth or soup.  1 cup cream soup.   cup regular custard.   cup regular pudding.  1 cup sports drink.  1 cup plain yogurt.  1 slice toast.  6 squares saltine crackers.  5 vanilla wafers. SEEK MEDICAL CARE IF:   You are unable to drink fluids, even small amounts.  You have nausea and vomiting for more than 6 hours.  You have diarrhea for more than 6 hours.  Your blood glucose level is more than 240 mg/dL, even with additional insulin.  There is a change in mental status.  You develop an  additional serious sickness.  You have been sick for 2 days and are not getting better.  You have a fever. SEEK IMMEDIATE MEDICAL CARE IF:  You have difficulty breathing.  You have moderate to large ketone levels. MAKE SURE YOU:  Understand these instructions.  Will watch your condition.  Will get help right away if you are not doing well or get worse. Document Released: 12/21/2002 Document Revised: 05/04/2013 Document Reviewed: 05/27/2012 [employer] Patient Information 2015 [place], [place]. This information is not intended to replace advice given to you by your health care provider. Make sure you discuss any questions you have with your health care provider.

## 2014-01-27 ENCOUNTER — Encounter: Payer: Self-pay | Admitting: Internal Medicine

## 2014-01-29 ENCOUNTER — Ambulatory Visit: Admitting: Internal Medicine

## 2014-02-16 ENCOUNTER — Ambulatory Visit: Admitting: Internal Medicine

## 2014-02-22 ENCOUNTER — Telehealth: Payer: Self-pay | Admitting: Internal Medicine

## 2014-02-22 NOTE — Telephone Encounter (Signed)
Please read message below and advise.  

## 2014-02-22 NOTE — Telephone Encounter (Signed)
pts sugar is running low due to not being able to eat with her GI issues. She does have an appt with GI on Wednesday. Please advise

## 2014-02-22 NOTE — Telephone Encounter (Signed)
I cannot decide until I have CBG values and time of the low CBGs. Let's find this out.

## 2014-02-23 ENCOUNTER — Telehealth: Payer: Self-pay | Admitting: Internal Medicine

## 2014-02-23 NOTE — Telephone Encounter (Signed)
Patient called wanting to know why she missed a phone call  I advised the patient she was to call back with her blood sugar readings  2.22.16  Breakfast: 284 Lunch: 62 2 Hrs after: 87 At dinner: 113 Before bed: 156  2.23.16  Breakfast: 176   Thank you

## 2014-02-23 NOTE — Telephone Encounter (Signed)
Called pt and advised her per Dr Charlean SanfilippoGherghe's note. Pt wrote down the sliding scale.

## 2014-02-23 NOTE — Telephone Encounter (Signed)
Let's change her Sliding scale of NovoLog: - 151-200: + 1 units  - 201-250: + 2 units  - 251-300: + 3 units  - >300: + 4 unit

## 2014-02-23 NOTE — Telephone Encounter (Signed)
Called pt and lvm to return call with blood sugar levels and times.

## 2014-02-23 NOTE — Telephone Encounter (Signed)
Please read message below and advise.  

## 2014-02-24 ENCOUNTER — Other Ambulatory Visit (INDEPENDENT_AMBULATORY_CARE_PROVIDER_SITE_OTHER)

## 2014-02-24 ENCOUNTER — Encounter: Payer: Self-pay | Admitting: Internal Medicine

## 2014-02-24 ENCOUNTER — Ambulatory Visit (INDEPENDENT_AMBULATORY_CARE_PROVIDER_SITE_OTHER): Admitting: Internal Medicine

## 2014-02-24 VITALS — BP 138/82 | HR 88 | Ht 64.0 in | Wt 159.2 lb

## 2014-02-24 DIAGNOSIS — R112 Nausea with vomiting, unspecified: Secondary | ICD-10-CM

## 2014-02-24 LAB — CBC WITH DIFFERENTIAL/PLATELET
Basophils Absolute: 0 K/uL (ref 0.0–0.1)
Basophils Relative: 0.3 % (ref 0.0–3.0)
Eosinophils Absolute: 0.1 K/uL (ref 0.0–0.7)
Eosinophils Relative: 1.1 % (ref 0.0–5.0)
HCT: 39.7 % (ref 36.0–46.0)
Hemoglobin: 13 g/dL (ref 12.0–15.0)
Lymphocytes Relative: 22.5 % (ref 12.0–46.0)
Lymphs Abs: 1.4 K/uL (ref 0.7–4.0)
MCHC: 32.8 g/dL (ref 30.0–36.0)
MCV: 86.3 fl (ref 78.0–100.0)
Monocytes Absolute: 0.6 K/uL (ref 0.1–1.0)
Monocytes Relative: 9.2 % (ref 3.0–12.0)
Neutro Abs: 4.2 K/uL (ref 1.4–7.7)
Neutrophils Relative %: 66.9 % (ref 43.0–77.0)
Platelets: 192 K/uL (ref 150.0–400.0)
RBC: 4.6 Mil/uL (ref 3.87–5.11)
RDW: 15.3 % (ref 11.5–15.5)
WBC: 6.4 K/uL (ref 4.0–10.5)

## 2014-02-24 LAB — COMPREHENSIVE METABOLIC PANEL WITH GFR
ALT: 25 U/L (ref 0–35)
AST: 18 U/L (ref 0–37)
Albumin: 4.7 g/dL (ref 3.5–5.2)
Alkaline Phosphatase: 101 U/L (ref 39–117)
BUN: 16 mg/dL (ref 6–23)
CO2: 25 meq/L (ref 19–32)
Calcium: 9.8 mg/dL (ref 8.4–10.5)
Chloride: 105 meq/L (ref 96–112)
Creatinine, Ser: 0.71 mg/dL (ref 0.40–1.20)
GFR: 88.91 mL/min (ref >60.00)
Glucose, Bld: 229 mg/dL — ABNORMAL HIGH (ref 70–99)
Potassium: 4 meq/L (ref 3.5–5.1)
Sodium: 138 meq/L (ref 135–145)
Total Bilirubin: 0.5 mg/dL (ref 0.2–1.2)
Total Protein: 7.4 g/dL (ref 6.0–8.3)

## 2014-02-24 LAB — LIPASE: Lipase: 13 U/L (ref 11.0–59.0)

## 2014-02-24 LAB — AMYLASE: Amylase: 22 U/L — ABNORMAL LOW (ref 27–131)

## 2014-02-24 MED ORDER — PROMETHAZINE HCL 25 MG RE SUPP: 25.0000 mg | Freq: Four times a day (QID) | RECTAL | Status: DC

## 2014-02-24 MED ORDER — ONDANSETRON 4 MG PO TBDP: 4.0000 mg | ORAL_TABLET | Freq: Three times a day (TID) | ORAL | Status: DC

## 2014-02-24 NOTE — Patient Instructions (Addendum)
You have been scheduled for an endoscopy. Please follow written instructions given to you at your visit today. If you use inhalers (even only as needed), please bring them with you on the day of your procedure. Your physician has requested that you go to www.startemmi.com and enter the access code given to you at your visit today. This web site gives a general overview about your procedure. However, you should still follow specific instructions given to you by our office regarding your preparation for the procedure.  Your physician has requested that you go to the basement for lab work before leaving today.  We have sent the following medications to your pharmacy for you to pick up at your convenience: Phenergan, zofran  You have been [facility] abdominal [facility] (1st floor of hospital) on 03-01-14 at 2:00pm. Please arrive 15 minutes prior to your appointment for registration. Make certain not to have anything to eat or drink 6 hours prior to your appointment. Should you need to reschedule your appointment, please contact radiology at [phone]. This test typically takes about 30 minutes to perform.  Dr Phillips OdorGolding, Dr Jodene NamK.Gherge

## 2014-02-24 NOTE — Progress Notes (Signed)
Sabrina ComptonBarbara L [name] 06/01/1955 [id number]  Note: This dictation was prepared with Dragon digital system. Any transcriptional errors that result from this procedure are unintentional.   History of Present Illness: This is a 59 year old white female brought by her husband with acute nausea and vomiting,, gagging and retching on the way and spitting  into a paper back. There is nothing coming back. Husband relates that the patient has had chronic problems for many years as long as they been married but this is the worst episode lasting now about 5 weeks. She has lost about 5 pounds. She denies abdominal pain but has not been able to eat anything besides all cereal, and Ensure. We don't have any past medical records but the husband relates that she had upper endoscopy and colonoscopy more than 10 years ago a gastric emptying scan about 20 years ago which was very abnormal. He feels that her episodes are triggered by stress and anxiety. She has been on Prozac 20 mg daily and most recently started on Lamictal .She has Phenergan and Zofran at home. There is a history of fibromyalgia and question of Parkinson's disease. She is insulin-dependent diabetic since 2005. She has history of irritable bowel syndrome with predominant constipation.     Past Medical History  Diagnosis Date  . Diabetes mellitus without complication   . Depression   . IBS (irritable bowel syndrome)   . HTN (hypertension)   . Bipolar 1 disorder   . Hyperlipidemia   . Breast density 06/25/2012    Right breast density, will get mammogram and US    Past Surgical History  Procedure Laterality Date  . Cholecystectomy    . Abdominal hysterectomy    . Colonoscopy      Allergies  Allergen Reactions  . Reglan [Metoclopramide] Other (See Comments)    Chest pains    Family history and social history have been reviewed.  Review of Systems: Constant gagging and retching nausea and vomiting. No fever positive for weight loss of 5  pounds  The remainder of the 10 point ROS is negative except as outlined in the H&P  Physical Exam: General Appearance Well developed,  Distressed, retching and gagging into a plastic bag. Eyes  Non icteric  HEENT  Non traumatic, normocephalic  Mouth No lesion, tongue papillated, no cheilosis edentulous, involuntary movements of the tonsil Neck Supple without adenopathy, thyroid not enlarged, no carotid bruits, no JVD Lungs Clear to auscultation bilaterally COR Normal S1, normal S2, regular rhythm, no murmur, quiet precordium Abdomen soft with minimal tenderness in epigastrium. Normoactive bowel sounds. No distention no mass or rebound Rectal soft Hemoccult negative stool Extremities  No pedal edema Skin No lesions Neurological Alert and oriented x 3 Psychological Normal mood and affect  Assessment and Plan:   59 year old white female with the intractable nausea vomiting apparently acute as well as chronic. Husband describes these episodes occurring for past many years. He is worried about her getting dehydrated. She has been able to keep fluids down. We will obtain metabolic panel, CBC, amylase, lipase and schedule patient for upper abdominal ultrasound. We will also schedule upper endoscopy for 02/26/2014 to look for upper GI pathology. She will start Phenergan suppository 25 mg every 6 hours and Zofran 4 mg sublingually before each meal. She will try to keep hydrated. Depending on the lab results and on her general condition   she may require hospitalization and nasogastric decompression to break the cycle of vomiting. She saw a psychiatrist in the past and  apparently was told that she has a mental condition    [name] 02/24/2014

## 2014-02-25 ENCOUNTER — Telehealth: Payer: Self-pay | Admitting: Internal Medicine

## 2014-02-25 NOTE — Telephone Encounter (Signed)
Left a message for patient's husband to call back.  

## 2014-02-26 ENCOUNTER — Encounter: Payer: Self-pay | Admitting: Internal Medicine

## 2014-02-26 ENCOUNTER — Ambulatory Visit (AMBULATORY_SURGERY_CENTER): Admitting: Internal Medicine

## 2014-02-26 ENCOUNTER — Telehealth: Payer: Self-pay | Admitting: Internal Medicine

## 2014-02-26 ENCOUNTER — Encounter: Payer: Self-pay | Admitting: *Deleted

## 2014-02-26 VITALS — BP 163/95 | HR 85 | Temp 99.4°F | Resp 29 | Ht 64.0 in | Wt 159.0 lb

## 2014-02-26 DIAGNOSIS — K2270 Barrett's esophagus without dysplasia: Secondary | ICD-10-CM

## 2014-02-26 DIAGNOSIS — R112 Nausea with vomiting, unspecified: Secondary | ICD-10-CM

## 2014-02-26 DIAGNOSIS — K2980 Duodenitis without bleeding: Secondary | ICD-10-CM

## 2014-02-26 MED ORDER — OMEPRAZOLE 40 MG PO CPDR: 40.0000 mg | DELAYED_RELEASE_CAPSULE | Freq: Two times a day (BID) | ORAL | Status: DC

## 2014-02-26 MED ORDER — SODIUM CHLORIDE 0.9 % IV SOLN: 500.0000 mL | INTRAVENOUS | Status: DC

## 2014-02-26 NOTE — Telephone Encounter (Signed)
Patient is here today for procedure.

## 2014-02-26 NOTE — Op Note (Signed)
[facility] [address] [place], [id number]   ENDOSCOPY PROCEDURE REPORT  PATIENT: [name], [name]  MR#: [id number] BIRTHDATE: 02/11/1955 , 59  yrs. old GENDER: female ENDOSCOPIST: Hart Carwinora M Whitaker Holderman, MD REFERRED BY:  Assunta FoundJohn Golding, M.D. , Dr Elvera LennoxGherghe PROCEDURE DATE:  02/26/2014 PROCEDURE:  EGD w/ biopsy ASA CLASS:     Class II INDICATIONS:  nausea and vomiting. MEDICATIONS: Monitored anesthesia care and Propofol 200 mg IV TOPICAL ANESTHETIC: none  DESCRIPTION OF PROCEDURE: After the risks benefits and alternatives of the procedure were thoroughly explained, informed consent was obtained.  The LB [id number] endoscope was introduced through the mouth and advanced to the second portion of the duodenum , Without limitations.  The instrument was slowly withdrawn as the mucosa was fully examined.    Esophagus: esophagus was intubated without difficulty. Patient was retching and gagging prior to the procedure. Oxygen mid and distal esophageal mucosa was vascular and had an appearance of the gastric mucosa. Biopsies were obtained to rule out Barrett's esophagus. There was no discrete squamocolumnar junction. Lower esophageal sphincter was located 35. there was no evidence of esophagitis or Cameron erosions Stomach: gastric mucosa was normal. Gastric antrum and pyloric outlet was unremarkable. There was no evidence of retained bile or acid. Retroflexion of the endoscope revealed normal fundus and cardia Duodenum: there were multiple superficial ulcerations throughout the duodenal bulb and descending duodenum consisting all fall 5-6 mm ulcers with exudate and the old blood adherent to the center of the erosions. This was consistent with moderately severe duodenitis which extended to the second portion duodenum. Biopsies were obtained cm from the incisors[          The scope was then withdrawn from the patient and the procedure completed.  COMPLICATIONS: There  were no immediate complications.  ENDOSCOPIC IMPRESSION: 1.moderately severe duodenitis involving duodenal bulb and descending duodenum. Status post biopsies 2. Suspect Barrett's esophagus status post esophageal biopsies and proximal and mid esophagus 3. Nothing to account for intractable retching and vomiting  RECOMMENDATIONS: 1.  Await pathology results 2.  Increase PPI to twice a day dose 3.Phenergan suppository 25 mg every 6 hours to control the retching 4.Zofran 4 mg sublingually for meals 5.Ultrasound of the upper abdomen pending 6.Patient is also due for screening colonoscopy which will be scheduled whenever her nausea and vomiting stabilizes enough for her to take the prep 7.Office visit 2 weeks 8.I feel her vomiting and the retching is functional due to anxiety and mental disorder.  She will need stronger anti anxiety medications to  control her symptoms  REPEAT EXAM: for EGD pending biopsy results.  eSigned:  Hart Carwinora M Kenon Delashmit, MD 02/26/2014 8:46 AM    CC:  PATIENT NAME:  [name], [name] MR#: [id number]

## 2014-02-26 NOTE — Patient Instructions (Signed)
Biopsies taken today. Office visit 2 weeks with Dr.Brodie. Ultrasound of upper abdomen pending. Take medications as directed.   YOU HAD AN ENDOSCOPIC PROCEDURE TODAY AT THE [facility]: Refer to the procedure report that was given to you for any specific questions about what was found during the examination.  If the procedure report does not answer your questions, please call your gastroenterologist to clarify.  If you requested that your care partner not be given the details of your procedure findings, then the procedure report has been included in a sealed envelope for you to review at your convenience later.  YOU SHOULD EXPECT: Some feelings of bloating in the abdomen. Passage of more gas than usual.  Walking can help get rid of the air that was put into your GI tract during the procedure and reduce the bloating. If you had a lower endoscopy (such as a colonoscopy or flexible sigmoidoscopy) you may notice spotting of blood in your stool or on the toilet paper. If you underwent a bowel prep for your procedure, then you may not have a normal bowel movement for a few days.  DIET: Your first meal following the procedure should be a light meal and then it is ok to progress to your normal diet.  A half-sandwich or bowl of soup is an example of a good first meal.  Heavy or fried foods are harder to digest and may make you feel nauseous or bloated.  Likewise meals heavy in dairy and vegetables can cause extra gas to form and this can also increase the bloating.  Drink plenty of fluids but you should avoid alcoholic beverages for 24 hours.  ACTIVITY: Your care partner should take you home directly after the procedure.  You should plan to take it easy, moving slowly for the rest of the day.  You can resume normal activity the day after the procedure however you should NOT DRIVE or use heavy machinery for 24 hours (because of the sedation medicines used during the test).    SYMPTOMS TO REPORT  IMMEDIATELY: A gastroenterologist can be reached at any hour.  During normal business hours, 8:30 AM to 5:00 PM Monday through Friday, call [phone].  After hours and on weekends, please call the GI answering service at [phone] who will take a message and have the physician on call contact you.   Following lower endoscopy (colonoscopy or flexible sigmoidoscopy):  Excessive amounts of blood in the stool  Significant tenderness or worsening of abdominal pains  Swelling of the abdomen that is new, acute  Fever of 100F or higher  Following upper endoscopy (EGD)  Vomiting of blood or coffee ground material  New chest pain or pain under the shoulder blades  Painful or persistently difficult swallowing  New shortness of breath  Fever of 100F or higher  Black, tarry-looking stools  FOLLOW UP: If any biopsies were taken you will be contacted by phone or by letter within the next 1-3 weeks.  Call your gastroenterologist if you have not heard about the biopsies in 3 weeks.  Our staff will call the home number listed on your records the next business day following your procedure to check on you and address any questions or concerns that you may have at that time regarding the information given to you following your procedure. This is a courtesy call and so if there is no answer at the home number and we have not heard from you through the emergency physician on call,  we will assume that you have returned to your regular daily activities without incident.  SIGNATURES/CONFIDENTIALITY: You and/or your care partner have signed paperwork which will be entered into your electronic medical record.  These signatures attest to the fact that that the information above on your After Visit Summary has been reviewed and is understood.  Full responsibility of the confidentiality of this discharge information lies with you and/or your care-partner.

## 2014-02-26 NOTE — Progress Notes (Signed)
A/ox3 pleased with MAC, report to Robbin RN 

## 2014-02-26 NOTE — Progress Notes (Addendum)
Patient moaning and groaning and wretching. States she is "so sick". Sabrina ParsonsJohn Nulty, CRNA into see patient. Dr. Juanda [name] aware of patient and presentation, she states this is not unusual for this patient. [facility]. Patient has rash under left breast, Sabrina KirschnerNancy [name] states raw looking, patient states she usually puts Blistex on it and it clears it up.

## 2014-02-26 NOTE — Telephone Encounter (Signed)
Patient's spouse was given paper prescription during recovery today with her discharge information. Called and notified pt./R.Cayson Kalb,RN

## 2014-03-01 ENCOUNTER — Telehealth: Payer: Self-pay | Admitting: *Deleted

## 2014-03-01 ENCOUNTER — Ambulatory Visit (HOSPITAL_COMMUNITY)
Admission: RE | Admit: 2014-03-01 | Discharge: 2014-03-01 | Disposition: A | Discharge status: Home / Self Care | Source: Ambulatory Visit | Attending: Internal Medicine | Admitting: Internal Medicine

## 2014-03-01 DIAGNOSIS — R112 Nausea with vomiting, unspecified: Secondary | ICD-10-CM | POA: Insufficient documentation

## 2014-03-01 NOTE — Telephone Encounter (Signed)
No answer, message left for the patient. 

## 2014-03-02 ENCOUNTER — Encounter: Payer: Self-pay | Admitting: Internal Medicine

## 2014-03-03 ENCOUNTER — Telehealth: Payer: Self-pay | Admitting: *Deleted

## 2014-03-03 NOTE — Telephone Encounter (Signed)
Faxed over Rx for Ondansetron, 4 mg, #90, with four refills to [facility]. Rx was approved by Dr. Juanda ChanceBrodie on 03/03/14.

## 2014-03-03 NOTE — Telephone Encounter (Signed)
Entry error

## 2014-03-05 ENCOUNTER — Telehealth: Payer: Self-pay | Admitting: Internal Medicine

## 2014-03-05 NOTE — Telephone Encounter (Signed)
Left message for pt to call back  °

## 2014-03-08 NOTE — Telephone Encounter (Signed)
Spoke with patient and she is worried about having a procedure. Told patient to come to the 03/12/14 OV to discuss.

## 2014-03-09 ENCOUNTER — Telehealth: Payer: Self-pay | Admitting: *Deleted

## 2014-03-09 NOTE — Telephone Encounter (Signed)
Faxed Rx for Omeprazole, 40 mg, to [facility]. Dr. Juanda ChanceBrodie approved refill request for 90-day supply with four refills on 03/09/14.

## 2014-03-12 ENCOUNTER — Ambulatory Visit: Admitting: Internal Medicine

## 2014-03-12 ENCOUNTER — Ambulatory Visit (INDEPENDENT_AMBULATORY_CARE_PROVIDER_SITE_OTHER): Admitting: Internal Medicine

## 2014-03-12 ENCOUNTER — Encounter: Payer: Self-pay | Admitting: Internal Medicine

## 2014-03-12 ENCOUNTER — Telehealth: Payer: Self-pay | Admitting: Internal Medicine

## 2014-03-12 VITALS — BP 142/72 | HR 72 | Ht 64.0 in | Wt 154.2 lb

## 2014-03-12 DIAGNOSIS — R1110 Vomiting, unspecified: Secondary | ICD-10-CM

## 2014-03-12 MED ORDER — ALPRAZOLAM 1 MG PO TABS: ORAL_TABLET | ORAL | Status: DC

## 2014-03-12 MED ORDER — ALPRAZOLAM ER 2 MG PO TB24: ORAL_TABLET | ORAL | Status: DC

## 2014-03-12 NOTE — Telephone Encounter (Signed)
Called patient to let them know that once Dr. Juanda ChanceBrodie signs, I will fax over to [facility].

## 2014-03-12 NOTE — Progress Notes (Signed)
Sabrina ComptonBarbara L [name] 09/30/1955 [id number]  Note: This dictation was prepared with Dragon digital system. Any transcriptional errors that result from this procedure are unintentional.   History of Present Illness: This is a 59 year old white female with intractable nausea and vomiting due to anxiety and peptic duodenitis. She is an insulin-dependent diabetic. She has had  long episode of vomiting dating back to February 2016 with resulting weight loss of 13 pounds. Upper endoscopy on 02/26/2014 showed erosive duodenitis consistent with peptic duodenitis. H. pylori was negative. There was no Barrett's esophagus. She has been on omeprazole 40 mg twice a day with good results. She was also taking Zofran before each meal and Phenergan for breakthrough symptoms. She finally improved and has not vomited for past 3 days. She has kept all foods down according to the husband. She has been having normal bowel movements. Upper abdominal ultrasound on 03/01/2014 showed prior cholecystectomy. Normal spleen and mild fatty liver. Her liver function tests are normal. Her serum albumin is 4.7 and her amylase was normal at 22. She has an appointment with her psychiatrist Dr. Tomasa Randunningham in next few days. I have asked for her medicines to be adjusted if possible to decrease gastroparesis attributed to diabetes and psychotropic medications.    Past Medical History  Diagnosis Date  . Diabetes mellitus without complication   . Depression   . IBS (irritable bowel syndrome)   . HTN (hypertension)   . Bipolar 1 disorder   . Hyperlipidemia   . Breast density 06/25/2012    Right breast density, will get mammogram and US  . Duodenitis     Past Surgical History  Procedure Laterality Date  . Cholecystectomy    . Abdominal hysterectomy    . Colonoscopy      Allergies  Allergen Reactions  . Reglan [Metoclopramide] Other (See Comments)    Chest pains    Family history and social history have been reviewed.  Review of  Systems: Nausea and vomiting has resolved. Weight loss of 13 pounds. Denies rectal bleeding  The remainder of the 10 point ROS is negative except as outlined in the H&P  Physical Exam: General Appearance Well developed, in no distress Eyes  Non icteric  HEENT  Non traumatic, normocephalic  Mouth No lesion, tongue papillated, no cheilosis Neck Supple without adenopathy, thyroid not enlarged, no carotid bruits, no JVD Lungs Clear to auscultation bilaterally COR Normal S1, normal S2, regular rhythm, no murmur, quiet precordium Abdomen soft minimally tender in epigastrium. Normoactive bowel sounds. No distention or tympany Rectal not done Extremities  No pedal edema Skin No lesions Neurological Alert and oriented x 3 Psychological Normal mood and affect  Assessment and Plan:   59 year old white female with  anxiety and depression and intractable nausea vomiting of psychogenic origin. She also has peptic duodenitis which is currently treated with omeprazole 40 mg twice a day. She will have to stay on this medication long-term. Because the nausea and vomiting is precipitated by anxiety I would prefer that she switches to Xanax short acting 1 mg instead of the slow release XR, This may help with an acute attack . She also takes Phenergan and Zofran with it. I would like to see her in 6-8 weeks for follow-up    Sabrina SarDora Kileigh [name] 03/12/2014

## 2014-03-12 NOTE — Patient Instructions (Addendum)
We have sent the following medications to your pharmacy for you to pick up at your convenience:  Xanax  Please follow up with Dr. Juanda ChanceBrodie on 05/14/2014 at 3:15pm Dr Tiajuana AmassScott Cunningham, tel  [phone], please fax report to him Dr Assunta FoundJohn Golding

## 2014-03-30 ENCOUNTER — Ambulatory Visit: Admitting: Internal Medicine

## 2014-04-05 ENCOUNTER — Telehealth: Payer: Self-pay | Admitting: Internal Medicine

## 2014-04-05 NOTE — Telephone Encounter (Signed)
Spoke with patient and she states she is better and does not want to come for the May OV. Patient cancelled the appointment.

## 2014-04-20 ENCOUNTER — Encounter: Payer: Self-pay | Admitting: Internal Medicine

## 2014-04-20 ENCOUNTER — Ambulatory Visit (INDEPENDENT_AMBULATORY_CARE_PROVIDER_SITE_OTHER): Admitting: Internal Medicine

## 2014-04-20 ENCOUNTER — Telehealth: Payer: Self-pay | Admitting: Internal Medicine

## 2014-04-20 VITALS — BP 146/100 | HR 99 | Temp 98.2°F | Ht 64.0 in | Wt 165.0 lb

## 2014-04-20 DIAGNOSIS — E139 Other specified diabetes mellitus without complications: Secondary | ICD-10-CM | POA: Diagnosis not present

## 2014-04-20 MED ORDER — INSULIN GLARGINE 100 UNIT/ML SOLOSTAR PEN: PEN_INJECTOR | SUBCUTANEOUS | Status: DC

## 2014-04-20 NOTE — Telephone Encounter (Signed)
Patient stated that Dr Elvera LennoxGherghe need to call [employer],about her seeing Nutritionist Oran ReinLaura Jobe [phone]

## 2014-04-20 NOTE — Progress Notes (Signed)
Patient ID: Sabrina ComptonBarbara L [name], female   DOB: 05/31/1955, 59 y.o.   MRN: [id number]  HPI: Sabrina ComptonBarbara L [name] is a 59 y.o.-year-old female, initially referred by her PCP, Dr. Assunta FoundJohn [name] (PA: Lenise HeraldBenjamin [name]), for management of LADA, dx 2005, insulin-dependent since ~2006, controlled, with complications (PN, gastroparesis?). She returns for f/u for her DM2. Last visit 3.5 mo ago.  Since last visit, she had a lot of N/V (sugars worse) >> now resolved starting 2-3 weeks ago; and appetite is back. The episode was considered to be 2/2 psychiatric status.   Previous HbA1c levels: Office Visit on 04/20/2014  Component Date Value Ref Range Corresponding hemoglobin A1c  . Fructosamine 04/20/2014 388* 190 - 270 umol/L 8.2%.   Office Visit on 10/29/2013  Component Date Value Ref Range   . Fructosamine 10/29/2013 418* 190 - 270 umol/L 8.7%    Lab Results  Component Value Date   HGBA1C 4.6 07/16/2013   [facility] 07/16/2013   HGBA1C 4.8 04/15/2012  - 02/12/2013: 4.2% - 12/19/2012: 5.2% - 09/18/2012: 6.5%  She is now on: Metformin 1000 mg 2x a day  WelChol 3 x 625 mg tabs 2x a day  Lantus 20 units at bedtime NovoLog before a meal as follows:  - 6 units before a small meal - 7 units before a regular meal  - 8 units before a large meal  Sliding scale of NovoLog: - 150-175: + 1 unit  - 176-200: + 2 units  - 201-225: + 3 units  - 226-250: + 4 units  - >250: + 5 unit  Pt checks her sugars 4-6 a day and they are worse - reviewing the last 2 weeks after she started to feel better:  - am:  79-160 (1x 223) >>89-165 (184, 175, 270) >> 149-240, 280, 333 >> 86-188, 262 >> 157-231, 265 - 2h after b'fast: n/c >> 125-285 >> 97-269 >> 216 >> n/c >> 193-259, 299 >> 89-131 >> 124-239 - before lunch: 53, 61x2, 118-155, 179, 203 >> 68, 111-231 >> 49, 57, 83-152, 188 >> 110-175, 222, 239 - 2h after lunch: n/c >> 72-150 >> 129-283 >> 108 >>146, 169 >> 98-145, 258 >> 74-110, 198 >> 156, 159 - before  dinner: 65, 72, 117-161, 200s x3 >> 154-273, 324, 468 >> 44, 49, 61-144 >> 96, 129, 243-269 - 2h after dinner: n/c >> 166 >> 176-296 >> 117 >> n/c >> 142 >> 101, 149 >> 118, 208, 271 - bedtime: 45 x 1, 83-182, 211 x1 >> 114-168, 185 >> 65, 71, 169-378, 441 >> 101-188, 262 >> 105-321 (eats a pack of nabs after dinner) - at night: 135-193 >> 37 x1, 110 >> n/c Has lows. Lowest sugar was 53; she has hypoglycemia awareness at 50. Highest sugar was 311 >> 240s >> 325  Pt's meals are: - Breakfast: toast, bowl of cereal + 2% milk  - Lunch: soup, sometimes sandwich, vegetables - Dinner: chicken/pork/beef + vegetable, no starch - Snacks: sugar free sweets Had DM education in 2012. Her insurance does not pay for nutrition or DM edu classes.  - no CKD, last BUN/creatinine:  Previously: Lab Results  Component Value Date   BUN 16 02/24/2014   CREATININE 0.71 02/24/2014  On Losartan. - last set of lipids: 161/92/46/97 in 03/18/2013.  Not on a statin  - last eye exam was on 11/05/2013 - [name]. No DR.  - + numbness and tingling in her feet. On Neurontin.  Foot exam checked by PCP: 10/2012. She is  seeing her podiatrist [name]).  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 She recently stopped [name]. Also: GERD, HTN, HL, Fibromyalgia >> on Gabapentin.  I reviewed pt's medications, allergies, PMH, social hx, family hx, and changes were documented in the history of present illness. Otherwise, unchanged from my initial visit note. She stopped Prozac and Xanax.   ROS: Constitutional: + weight loss and gain, + fatigue, + poor sleep Eyes: no blurry vision, no xerophthalmia ENT: no sore throat, no nodules palpated in throat, no dysphagia/odynophagia, no hoarseness Cardiovascular: no CP/SOBpalpitations/no leg swelling Respiratory: no cough/SOB Gastrointestinal: + N/+ V/no D/+ C Musculoskeletal: + muscle/+ joint aches Skin:  no rashes,+ hair loss Neurological: + tremors/no numbness/tingling/dizziness  PE: BP 146/100 mmHg  Pulse 99  Temp(Src) 98.2 F (36.8 C) (Oral)  Ht  (1.626 m)  Wt 165 lb (74.844 kg)  BMI 28.31 kg/m2  SpO2 95% Body mass index is 28.31 kg/(m^2).  Wt Readings from Last 3 Encounters:  04/20/14 165 lb (74.844 kg)  03/12/14 154 lb 4 oz (69.967 kg)  02/26/14 159 lb (72.122 kg)   Constitutional: overweight, in distress b/c feeling nauseated Eyes: PERRLA, EOMI, no exophthalmos ENT: moist mucous membranes, no thyromegaly, no cervical lymphadenopathy Cardiovascular: RRR, No MRG Respiratory: CTA B Gastrointestinal: abdomen soft, NT, ND, BS+ Musculoskeletal: no deformities, strength intact in all 4 Skin: moist, warm, no rashes Neurological: no tremor with outstretched hands, DTR normal in all 4  ASSESSMENT: 1. LADA, insulin-dependent, uncontrolled, with complications - peripheral neuropathy - gastroparesis?  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 She still has a positive C peptide >> still has insulin secretion. For this reason, we continued the Metformin for now. Welchol was started by PCP for cholesterol lowering, so we continued this, also.  Labs from 11/17/2013: - ACR 29.4 - CBC with differential normal, except eosinophiles 6% (0-5) - CMP normal, with a BUN/creatinine ratio of 13/0.55, random glucose 73, GFR >89 - Lipids: 161/92/46/97 - TSH 1.918 - Urinalysis normal, without ketones or glucose  PLAN:  1. Patient with LADA, on basal-bolus + oral antidiabetic regimen, with worsened control lately due to an episode of depression associated with severe nausea. In the last 3 weeks, her nausea started to  resolve and her appetite returned. Sugars are still high but she is trying to adjust her diet to avoid fluctuations in her CBGs.Organizing increase her Lantus a little bit, and allow her to return to her normal nutrition before adjusting her mealtime insulin. -  I suggested to:  Patient Instructions  Please continue: - Metformin 1000 mg 2x a day  - WelChol 3 x 625 mg tabs 2x a day - NovoLog before a meal as follows:  - 6 units before a small meal - 7 units before a regular meal  - 8 units before a large meal  - Sliding scale of NovoLog: - 150-175: + 1 unit  - 176-200: + 2 units  - 201-225: + 3 units  - 226-250: + 4 units  - >  250: + 5 unit  Please increase: - Lantus to 24 units at bedtime  Please return in 1.5 month with your sugar log.   Please stop at the lab.  Please schedule an appt with Oran Rein with nutrition.  - continue checking sugars at different times of the day - check 3-4 times a day, rotating checks - up to date with eye exams - will check a fructosamine - Return to clinic in 1.5 mo with sugar log   Office Visit on 04/20/2014  Component Date Value Ref Range Status  . Fructosamine 04/20/2014 388* 190 - 270 umol/L Final   The hemoglobin A1c calculated back from the fructosamine is 8.2%. This has improved a little since last visit.

## 2014-04-20 NOTE — Patient Instructions (Addendum)
Please continue: - Metformin 1000 mg 2x a day  - WelChol 3 x 625 mg tabs 2x a day - NovoLog before a meal as follows:  - 6 units before a small meal - 7 units before a regular meal  - 8 units before a large meal  - Sliding scale of NovoLog: - 150-175: + 1 unit  - 176-200: + 2 units  - 201-225: + 3 units  - 226-250: + 4 units  - >250: + 5 unit  Please increase: - Lantus to 24 units at bedtime  Please return in 1.5 month with your sugar log.   Please stop at the lab.  Please schedule an appt with Oran ReinLaura Jobe with nutrition.

## 2014-04-21 NOTE — Telephone Encounter (Signed)
Please read message below and advise. Thank you.  

## 2014-04-21 NOTE — Telephone Encounter (Signed)
No, she needs to call them.

## 2014-04-21 NOTE — Telephone Encounter (Signed)
Corrie DandyMary, are you the one to contact them to get this approved? Please advise.

## 2014-04-23 LAB — FRUCTOSAMINE: FRUCTOSAMINE: 388 umol/L — AB (ref 190–270)

## 2014-04-29 ENCOUNTER — Ambulatory Visit: Admitting: Internal Medicine

## 2014-04-29 NOTE — Telephone Encounter (Signed)
No we don,t do precert for [employer] retired

## 2014-04-30 ENCOUNTER — Telehealth: Payer: Self-pay | Admitting: Internal Medicine

## 2014-04-30 NOTE — Telephone Encounter (Signed)
Patient would like to know if her insurance will cover the visit with the nutritionist I advised patient to call her insurance and she states they advised her to call our office   Insurance: [employer]   Thank you

## 2014-05-03 NOTE — Telephone Encounter (Signed)
Called pt and advised her that we called [employer] and they will not cover her seeing a Nutritionist. Pt voiced understanding.

## 2014-05-12 MED ORDER — INSULIN ASPART 100 UNIT/ML FLEXPEN: 5.0000 [IU] | PEN_INJECTOR | Freq: Three times a day (TID) | SUBCUTANEOUS | Status: DC

## 2014-05-12 MED ORDER — INSULIN GLARGINE 100 UNIT/ML SOLOSTAR PEN: PEN_INJECTOR | SUBCUTANEOUS | Status: DC

## 2014-05-12 NOTE — Telephone Encounter (Signed)
Done

## 2014-05-12 NOTE — Telephone Encounter (Signed)
Patient need 90 day supply Novalog lantus solostar.

## 2014-05-14 ENCOUNTER — Ambulatory Visit: Admitting: Internal Medicine

## 2014-05-26 ENCOUNTER — Telehealth: Payer: Self-pay | Admitting: Endocrinology

## 2014-05-26 NOTE — Telephone Encounter (Signed)
Increase all units of insulin by 10 including Lantus

## 2014-05-26 NOTE — Telephone Encounter (Signed)
Sugars are running high 250-300 and 10 min ago it was 424.

## 2014-05-26 NOTE — Telephone Encounter (Signed)
Noted, patient is aware. 

## 2014-05-26 NOTE — Telephone Encounter (Signed)
Please advise in Dr. Gherghe's absence. 

## 2014-05-27 ENCOUNTER — Telehealth: Payer: Self-pay | Admitting: Internal Medicine

## 2014-05-27 NOTE — Telephone Encounter (Signed)
Pt letting us know that her blood sugar was 154 this AM and she would like a call back she has questions for the nurse please

## 2014-05-27 NOTE — Telephone Encounter (Signed)
Returned pt's call. Answered pt's questions concerning blood sugars. Pt voiced understanding.

## 2014-06-01 ENCOUNTER — Ambulatory Visit (INDEPENDENT_AMBULATORY_CARE_PROVIDER_SITE_OTHER): Admitting: Internal Medicine

## 2014-06-01 ENCOUNTER — Encounter: Payer: Self-pay | Admitting: Internal Medicine

## 2014-06-01 VITALS — BP 124/78 | HR 90 | Temp 98.4°F | Resp 12 | Wt 168.6 lb

## 2014-06-01 DIAGNOSIS — E139 Other specified diabetes mellitus without complications: Secondary | ICD-10-CM

## 2014-06-01 NOTE — Progress Notes (Signed)
Patient ID: Sabrina ComptonBarbara L Leuthold, female   DOB: 11/08/1955, 59 y.o.   MRN: [id number]  HPI: Sabrina ComptonBarbara L [name] is a 59 y.o.-year-old female, initially referred by her PCP, Dr. Assunta FoundJohn Golding (PA: Lenise HeraldBenjamin Mann), for management of LADA, dx 2005, insulin-dependent since ~2006, controlled, with complications (PN, gastroparesis?). She returns for f/u for her DM2. Last visit 1.5 mo ago.  She gained 3 lbs since last visit.   Her psychiatrist left practice. She needs a new one.  Previous HbA1c levels: Office Visit on 04/20/2014  Component Date Value Ref Range Corresponding hemoglobin A1c  . Fructosamine 04/20/2014 388* 190 - 270 umol/L 8.2%.   Office Visit on 10/29/2013  Component Date Value Ref Range   . Fructosamine 10/29/2013 418* 190 - 270 umol/L 8.7%    Lab Results  Component Value Date   HGBA1C 4.6 07/16/2013   [facility]   HGBA1C 4.8 04/15/2012  - 02/12/2013: 4.2% - 12/19/2012: 5.2% - 09/18/2012: 6.5%  She is now on: Metformin 1000 mg 2x a day  WelChol 3 x 625 mg tabs 2x a day  Lantus 20 >> 24 units at bedtime NovoLog before a meal as follows:  - 6 units before a small meal - 7 units before a regular meal  - 8 units before a large meal  Sliding scale of NovoLog: - 150-175: + 1 unit  - 176-200: + 2 units  - 201-225: + 3 units  - 226-250: + 4 units  - >250: + 5 unit  Pt checks her sugars 4-6 a day: - am:  89-165 (184, 175, 270) >> 149-240, 280, 333 >> 86-188, 262 >> 157-231, 265 >> 100-222 - 2h after b'fast: 97-269 >> 216 >> n/c >> 193-259, 299 >> 89-131 >> 124-239 >> 159-253 - before lunch: 68, 111-231 >> 49, 57, 83-152, 188 >> 110-175, 222, 239 >> 107-150, 190, 253 - 2h after lunch: 129-283 >> 108 >>146, 169 >> 98-145, 258 >> 74-110, 198 >> 156, 159 >> 140-218 - before dinner: 154-273, 324, 468 >> 44, 49, 61-144 >> 96, 129, 243-269 >> 72, 130-290 - 2h after dinner: n/c >> 166 >> 176-296 >> 117 >> n/c >> 142 >> 101, 149 >> 118, 208, 271 >> 160 -  bedtime: 65, 71, 169-378, 441 >> 101-188, 262 >> 105-321 (eats a pack of nabs after dinner) >> 83-203 - at night: 135-193 >> 37 x1, 110 >> n/c >> 108, 228 Has lows. Lowest sugar was 53; she has hypoglycemia awareness at 50. Highest sugar was 311 >> 240s >> 325  Pt's meals are: - Breakfast: toast, bowl of cereal + 2% milk  - Lunch: soup, sometimes sandwich, vegetables - Dinner: chicken/pork/beef + vegetable, no starch - Snacks: sugar free sweets Had DM education in 2012. Her insurance does not pay for nutrition or DM edu classes.  - no CKD, last BUN/creatinine:  Previously: Lab Results  Component Value Date   BUN 16 02/24/2014   CREATININE 0.71 02/24/2014  On Losartan. - last set of lipids: 161/92/46/97 in 03/18/2013.  Not on a statin  - last eye exam was on 11/05/2013 - [place]. No DR.  - + numbness and tingling in her feet. On Neurontin.  Foot exam checked by PCP: 10/2012. She is seeing her podiatrist [facility]).  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 She recently stopped JordanLatuda. Also: GERD, HTN, HL, Fibromyalgia >> on Gabapentin.  I  reviewed pt's medications, allergies, PMH, social hx, family hx, and changes were documented in the history of present illness. Otherwise, unchanged from my initial visit note.   ROS: Constitutional: + weight loss and gain, + fatigue, + poor sleep Eyes: no blurry vision, no xerophthalmia ENT: no sore throat, no nodules palpated in throat, no dysphagia/odynophagia, no hoarseness Cardiovascular: no CP/SOBpalpitations/no leg swelling Respiratory: no cough/SOB Gastrointestinal: no N/no V/no D/+ C Musculoskeletal: + muscle/no joint aches Skin: no rashes,+ hair loss Neurological: + tremors/no numbness/tingling/dizziness, + HA  PE: BP 124/78 mmHg  Pulse 90  Temp(Src) 98.4 F (36.9 C) (Oral)  Resp 12  Wt 168 lb 9.6 oz (76.476 kg)  SpO2 95% Body mass index is 28.93  kg/(m^2).  Wt Readings from Last 3 Encounters:  06/01/14 168 lb 9.6 oz (76.476 kg)  04/20/14 165 lb (74.844 kg)  03/12/14 154 lb 4 oz (69.967 kg)   Constitutional: overweight, in distress b/c feeling nauseated Eyes: PERRLA, EOMI, no exophthalmos ENT: moist mucous membranes, no thyromegaly, no cervical lymphadenopathy Cardiovascular: RRR, No MRG Respiratory: CTA B Gastrointestinal: abdomen soft, NT, ND, BS+ Musculoskeletal: no deformities, strength intact in all 4 Skin: moist, warm, no rashes Neurological: no tremor with outstretched hands, DTR normal in all 4  ASSESSMENT: 1. LADA, insulin-dependent, uncontrolled, with complications - peripheral neuropathy - gastroparesis?  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 She still has a positive C peptide >> still has insulin secretion. For this reason, we continued the Metformin for now. Welchol was started by PCP for cholesterol lowering, so we continued this, also.  Labs from 11/17/2013: - ACR 29.4 - CBC with differential normal, except eosinophiles 6% (0-5) - CMP normal, with a BUN/creatinine ratio of 13/0.55, random glucose 73, GFR >89 - Lipids: 161/92/46/97 - TSH 1.918 - Urinalysis normal, without ketones or glucose  PLAN:  1. Patient with LADA, on basal-bolus + oral antidiabetic regimen, with improved control in last week. Sugars still variable, but higher in am >> will increase Lantus a little.  Insurance does not cover nutrition visits >> given instructions. -  I suggested to:  Patient Instructions  Please continue: - Metformin 1000 mg 2x a day  - WelChol 3 x 625 mg tabs 2x a day   Please increase: - Lantus to 28 units at bedtime - NovoLog  before a meal as follows:  - 7 units before a small meal - 8 units before a regular meal  - 9 units before a large meal   Sliding scale of NovoLog: - 150-175: + 1 unit  - 176-200: + 2 units  - 201-225: + 3 units  - 226-250: + 4 units  - >250: + 5 unit   Please consider the following ways to cut down carbs and fat and increase fiber and micronutrients in your diet:  - substitute whole grain for white bread or pasta - substitute brown rice for white rice - substitute 90-calorie flat bread pieces for slices of bread when possible - substitute sweet potatoes or yams for white potatoes - substitute humus for margarine - substitute tofu for cheese when  possible - substitute almond or rice milk for regular milk (would not drink soy milk daily due to concern for soy estrogen influence on breast cancer risk) - substitute dark chocolate for other sweets when possible - substitute water - can add lemon or orange slices for taste - for diet sodas (artificial sweeteners will trick your body that you can eat sweets without getting calories and will lead you to overeating and weight gain in the long run) - do not skip breakfast or other meals (this will slow down the metabolism and will result in more weight gain over time)  - can try smoothies made from fruit and almond/rice milk in am instead of regular breakfast - can also try old-fashioned (not instant) oatmeal made with almond/rice milk in am - order the dressing on the side when eating salad at a restaurant (pour less than half of the dressing on the salad) - eat as little meat as possible - can try juicing, but should not forget that juicing will get rid of the fiber, so would alternate with eating raw veg./fruits or drinking smoothies - use as little oil as possible, even when using olive oil - can dress a salad with a mix of balsamic vinegar and lemon juice, for e.g. - use agave nectar, stevia sugar, or regular sugar rather than  artificial sweateners - steam or broil/roast veggies  - snack on veggies/fruit/nuts (unsalted, preferably) when possible, rather than processed foods - reduce or eliminate aspartame in diet (it is in diet sodas, chewing gum, etc) Read the labels!  Try to replace snacking on these with drinking/eating: * soy or almond milk * veggies with humus or other low calorie/low fat dip * low glycemic index fruits (higher glycemic index = higher risk to increase your sugars):          http://www.health.https://www.brown.info/ * fruit/veggie smoothies          Ninja blender recipes:          [employer]Parks.com.ee * unsalted nuts Etc.   Try to read Dr. Katherina Right book: "Program for Reversing Diabetes" for the vegan concept and other ideas for healthy eating.  - continue checking sugars at different times of the day - check 3-4 times a day, rotating checks - up to date with eye exams - will check a fructosamine at next visit - Return to clinic in 3 mo with sugar log

## 2014-06-01 NOTE — Patient Instructions (Signed)
Please continue: - Metformin 1000 mg 2x a day  - WelChol 3 x 625 mg tabs 2x a day   Please increase: - Lantus to 28 units at bedtime - NovoLog before a meal as follows:  - 7 units before a small meal - 8 units before a regular meal  - 9 units before a large meal   Sliding scale of NovoLog: - 150-175: + 1 unit  - 176-200: + 2 units  - 201-225: + 3 units  - 226-250: + 4 units  - >250: + 5 unit   Please consider the following ways to cut down carbs and fat and increase fiber and micronutrients in your diet:  - substitute whole grain for white bread or pasta - substitute brown rice for white rice - substitute 90-calorie flat bread pieces for slices of bread when possible - substitute sweet potatoes or yams for white potatoes - substitute humus for margarine - substitute tofu for cheese when possible - substitute almond or rice milk for regular milk (would not drink soy milk daily due to concern for soy estrogen influence on breast cancer risk) - substitute dark chocolate for other sweets when possible - substitute water - can add lemon or orange slices for taste - for diet sodas (artificial sweeteners will trick your body that you can eat sweets without getting calories and will lead you to overeating and weight gain in the long run) - do not skip breakfast or other meals (this will slow down the metabolism and will result in more weight gain over time)  - can try smoothies made from fruit and almond/rice milk in am instead of regular breakfast - can also try old-fashioned (not instant) oatmeal made with almond/rice milk in am - order the dressing on the side when eating salad at a restaurant (pour less than half of the dressing on the salad) - eat as little meat as possible - can try juicing, but should not forget that juicing will get rid of the fiber, so would alternate with eating raw veg./fruits or drinking smoothies - use as little oil as possible, even when using olive  oil - can dress a salad with a mix of balsamic vinegar and lemon juice, for e.g. - use agave nectar, stevia sugar, or regular sugar rather than artificial sweateners - steam or broil/roast veggies  - snack on veggies/fruit/nuts (unsalted, preferably) when possible, rather than processed foods - reduce or eliminate aspartame in diet (it is in diet sodas, chewing gum, etc) Read the labels!  Try to replace snacking on these with drinking/eating: * soy or almond milk * veggies with humus or other low calorie/low fat dip * low glycemic index fruits (higher glycemic index = higher risk to increase your sugars):          http://www.health.https://www.brown.info/[employer]/newsweek/Glycemic_index_and_glycemic_load_for_100_foods.htm * fruit/veggie smoothies          Ninja blender recipes:          CultureParks.com.eehttp://www.ninjakitchen.com/recipes/search/14/smoothies-and-juices/ * unsalted nuts Etc.   Try to read Dr. Katherina RightNeal Barnard's book: "Program for Reversing Diabetes" for the vegan concept and other ideas for healthy eating.

## 2014-06-24 ENCOUNTER — Telehealth: Payer: Self-pay | Admitting: *Deleted

## 2014-06-24 MED ORDER — ONDANSETRON 4 MG PO TBDP: 4.0000 mg | ORAL_TABLET | Freq: Three times a day (TID) | ORAL | Status: DC

## 2014-06-24 NOTE — Telephone Encounter (Signed)
Sent Rx for ondansetron [facility]), 4 mg, #90 with four refills to [facility].

## 2014-07-22 ENCOUNTER — Ambulatory Visit (INDEPENDENT_AMBULATORY_CARE_PROVIDER_SITE_OTHER): Admitting: Psychiatry

## 2014-07-22 ENCOUNTER — Encounter (INDEPENDENT_AMBULATORY_CARE_PROVIDER_SITE_OTHER): Payer: Self-pay

## 2014-07-22 ENCOUNTER — Encounter (HOSPITAL_COMMUNITY): Payer: Self-pay | Admitting: Psychiatry

## 2014-07-22 VITALS — BP 152/88 | HR 100 | Ht 64.0 in | Wt 169.8 lb

## 2014-07-22 DIAGNOSIS — F319 Bipolar disorder, unspecified: Secondary | ICD-10-CM | POA: Diagnosis not present

## 2014-07-22 DIAGNOSIS — F419 Anxiety disorder, unspecified: Secondary | ICD-10-CM | POA: Diagnosis not present

## 2014-07-22 DIAGNOSIS — F3131 Bipolar disorder, current episode depressed, mild: Secondary | ICD-10-CM

## 2014-07-22 MED ORDER — CLONAZEPAM 1 MG PO TABS: ORAL_TABLET | ORAL | Status: DC

## 2014-07-22 MED ORDER — LAMOTRIGINE 100 MG PO TABS: 100.0000 mg | ORAL_TABLET | Freq: Every day | ORAL | Status: DC

## 2014-07-22 MED ORDER — TRAZODONE HCL 50 MG PO TABS: 50.0000 mg | ORAL_TABLET | Freq: Every day | ORAL | Status: DC

## 2014-07-22 MED ORDER — LAMOTRIGINE 200 MG PO TABS: 200.0000 mg | ORAL_TABLET | Freq: Every day | ORAL | Status: DC
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
## 2014-07-23 ENCOUNTER — Telehealth (HOSPITAL_COMMUNITY): Payer: Self-pay

## 2014-07-23 NOTE — Telephone Encounter (Signed)
Telephone call with [facility] to verify Dr. Sheela Stack order form 07/22/14 as the correct order is for lamictal , one a day.  Spoke with Luellen Pucker, pharmacist to verify the order.  Called patient back to inform [facility] was called to verify her Lamictal dosage as patient had left a message with concern they needed to hear from our office on this issue.  Patient to call back if any more problems receiving refill.

## 2014-07-26 ENCOUNTER — Telehealth (HOSPITAL_COMMUNITY): Payer: Self-pay

## 2014-07-26 NOTE — Telephone Encounter (Signed)
Telephone call with patient questioning when Dr. Lolly Mustache would do another order for her Klonopin as she reports she will run out prior to returning and it takes some time for [facility] to send.  Agreed patient would call this nurse back around 08/09/14 to see if Dr. Lolly Mustache would do a new order then as reports she is good on Lamictal and Trazodone and has enough Fluoxetine  until appointment on 08/23/14 and will bring bottle that date as Dr. Lolly Mustache requested.

## 2014-07-28 ENCOUNTER — Telehealth (HOSPITAL_COMMUNITY): Payer: Self-pay

## 2014-07-28 NOTE — Telephone Encounter (Signed)
Medication problem - patient called wanting to clarify how she is to start Gabapentin to make sure she was doing correctly.  Patient reports ongoing nausea with Konopin, crying more, and states "I'm just not as relaxed".  Would like follow up for advice.

## 2014-07-30 ENCOUNTER — Other Ambulatory Visit (HOSPITAL_COMMUNITY): Payer: Self-pay | Admitting: Psychiatry

## 2014-07-30 NOTE — Telephone Encounter (Signed)
Telephone message left for patient of Dr. Sheela Stack instructions and that patient should give the changes he made more time as she has just increased Gabapentin recently.  Instructed no early refills of Klonopin would be authorized so she needs to take as is prescribed and approved by Dr. Lolly Mustache and to keep upcoming appointment on 08/23/14.  Patient to call back if any further concerns.

## 2014-07-30 NOTE — Telephone Encounter (Signed)
No early refills of Klonopin.  She need to try Klonopin and gabapentin until she see the psychiatrist on her next appointment.

## 2014-08-02 ENCOUNTER — Telehealth: Payer: Self-pay | Admitting: Internal Medicine

## 2014-08-02 ENCOUNTER — Telehealth (HOSPITAL_COMMUNITY): Payer: Self-pay | Admitting: *Deleted

## 2014-08-02 NOTE — Telephone Encounter (Signed)
OK. Let us know if the sugars increase. Also, please let us know about any other changes suggested in her DM regimen before she actually starts them.

## 2014-08-02 NOTE — Telephone Encounter (Signed)
Please read message below and advise.  

## 2014-08-02 NOTE — Telephone Encounter (Signed)
[name] called this morning with some concern  Patient states she saw a new psychiatrist the other day and he changed her medication   Dr. Elvera Lennox: Metformin 2 in the am and 2 in the pm  Now changed to 1 in the am and 1 in the pm   Her sugars havent changed or been any issues since the change Mrs. Meckel would like to make sure this is ok   Please advise patient    Thank you

## 2014-08-02 NOTE — Telephone Encounter (Signed)
Pt called for a refill for Prozac and Klonopin. Informed pt it is too early for a refill. Offered pt to schedule an earlier appt, pt decline and will wait to be seen on 8/22.

## 2014-08-02 NOTE — Telephone Encounter (Signed)
Called pt and advised her per Dr Gherghe's message below. Pt voiced understanding.  

## 2014-08-03 ENCOUNTER — Other Ambulatory Visit (HOSPITAL_COMMUNITY): Payer: Self-pay | Admitting: Psychiatry

## 2014-08-10 ENCOUNTER — Telehealth (HOSPITAL_COMMUNITY): Payer: Self-pay

## 2014-08-10 ENCOUNTER — Ambulatory Visit (HOSPITAL_COMMUNITY): Payer: Self-pay | Admitting: Psychiatry

## 2014-08-10 DIAGNOSIS — F3131 Bipolar disorder, current episode depressed, mild: Secondary | ICD-10-CM

## 2014-08-10 NOTE — Telephone Encounter (Signed)
Medication refill request for Clonazepam - Telephone call with patient who would like to get the new 30 day order e-scribed now as patient reports it takes [facility] 14 days to process new orders and 3 to deliver and she does not return to see Dr. Lolly Mustache until 08/23/14 and will be out on 08/22/14.  Patient requests new order be sent immediately so it can be delivered and she will not have to go too long without it.  Informed would send request to Dr. Lolly Mustache to request it be filled as soon as possible.

## 2014-08-11 ENCOUNTER — Other Ambulatory Visit (HOSPITAL_COMMUNITY): Payer: Self-pay | Admitting: Psychiatry

## 2014-08-11 NOTE — Telephone Encounter (Signed)
Met with Dr. Adele Schilder who requested patient get her Clonazepam from a local pharmacy due to problems having to wait on [facility] to deliver the medication.  Patient was not happy with this during telephone call and explained the printed prescription from 07/22/14 she had to get filled at [facility] due to she needed it at that time and it cost her $20.  Stated she did not think she should have to pay this amount when [facility] will send it to her for free but it needed to be ordered now.  Discussed more with Dr. Adele Schilder who authorized calling in a new order to [facility] today with request they expedite the delivery.  Informed patient of this on the phone as she then stated she did not like the Clonazepam as much as her past used Xanax and patient agreed she would discuss this further at appointment on 08/22/14. Telephone call to [facility] who stated the controlled order had to be faxed or mailed into them as they do not take called in orders for controlled substances.

## 2014-08-11 NOTE — Progress Notes (Signed)
Klonopin [facility] #[phone]

## 2014-08-11 NOTE — Telephone Encounter (Signed)
Met with Dr. Adele Schilder who signed a written prescription for patient's refill of Clonazepam and faxed to [facility] to be mail ordered to patient [phone])

## 2014-08-11 NOTE — Telephone Encounter (Signed)
30 days supply to local pahramcy

## 2014-08-12 ENCOUNTER — Telehealth (HOSPITAL_COMMUNITY): Payer: Self-pay

## 2014-08-12 NOTE — Telephone Encounter (Signed)
Medication management - Faxed back to [facility] additional information needed by them to fill patient's sent prescription for Clonazepam.  Faxed information to be scanned into Epic for this date.

## 2014-08-12 NOTE — Telephone Encounter (Signed)
Medication management - called patient back to inform additional information was sent to [facility] by fax earlier this date, around 9am, so patient's Clonazepam would be filled as patient left 3 messages today to question if this had been done.  Patient stated she would like to begin getting a 90 day supply of Clonazepam and will discuss with Dr. Lolly Mustache at upcoming appointment 08/23/14.

## 2014-08-13 ENCOUNTER — Telehealth (HOSPITAL_COMMUNITY): Payer: Self-pay

## 2014-08-13 NOTE — Telephone Encounter (Signed)
Telephone call with Verlon Au at [facility] [employer] department after patient left a message [facility] still needed [place] to verify something for them to send out her requested Klonopin prescription.  Verlon Au, representative reported the medication was set to be shipped on 08/18/14 and there should not be any problems with this being sent.  Called patient to inform and of date medications were set to be sent.

## 2014-08-23 ENCOUNTER — Ambulatory Visit (HOSPITAL_COMMUNITY): Payer: Self-pay | Admitting: Psychiatry

## 2014-08-24 ENCOUNTER — Telehealth (HOSPITAL_COMMUNITY): Payer: Self-pay

## 2014-08-24 DIAGNOSIS — F3131 Bipolar disorder, current episode depressed, mild: Secondary | ICD-10-CM

## 2014-08-24 MED ORDER — FLUOXETINE HCL 20 MG PO CAPS: 60.0000 mg | ORAL_CAPSULE | Freq: Every day | ORAL | Status: DC

## 2014-08-24 MED ORDER — CLONAZEPAM 1 MG PO TABS: ORAL_TABLET | ORAL | Status: DC

## 2014-08-24 NOTE — Telephone Encounter (Signed)
Medication refill requests - Telephone call with patient after she left 2 messages this date requesting 90 day orders for her Clonazepam and Prozac be sent to [facility]. Discussed getting patient an earlier appointment with Dr. Lolly Mustache if possible as patient was upset her appointment had been moved back again, now from 08/23/14 to 09/07/14 and requested Prozac at 60 mg a day.  Dr. Lolly Mustache had no 30 minute appointments available before 09/07/14 so discussed patient's requests with him.  Dr. Lolly Mustache did another written prescription for 30 days supply of patient's Clonazepam to be faxed to [facility] and agreed if they had been sending her  of Prozac to fill a 90 day, once a day order at that dosage.  Called [facility] and verified last Prozac dosage sent by [facility] to patient's home for Prozac was for , #90 by Dr. Tomasa Rand, the provider patient was seeing previously.  Dr. Lolly Mustache authorized a 90 day order to be sent for patient's Prozac and faxed 30 day order of Clonazepam, 1 mg, one twice a day as needed for anxiety with notation of no early refills. New prescription was faxed to [facility] at [phone].  Patient to keep appointment on 09/07/14 and as patient requested, a new Fluoxetine , 3 capsules a day, #270 order was e-scribed to patient's [facility] pharmacy as patient stated the 3 capsules a day at  was cheaper for her than  one a day.  Dr. Lolly Mustache agreed with this requested formula and order was sent as patient requested.  Called patient to inform Dr. Lolly Mustache authorized a new 90 day order for her Fluoxetine at , 3 a day, #270 but no refills.  Also informed patient he wrote another 30 day Clonazepam order and this was faxed to [facility] as well so patient should not need a new order for this within 2 months.  Patient was not happy Dr. Lolly Mustache will not send in 90 day orders plus refills for all medications and stated "I will just have to call you all every  2 weeks" but agreed to discuss this further at evaluation set for 09/07/14.

## 2014-08-25 ENCOUNTER — Other Ambulatory Visit: Payer: Self-pay | Admitting: *Deleted

## 2014-08-25 ENCOUNTER — Telehealth (HOSPITAL_COMMUNITY): Payer: Self-pay

## 2014-08-25 MED ORDER — FREESTYLE LANCETS MISC: Status: DC

## 2014-08-25 NOTE — Telephone Encounter (Signed)
I returned patient's phone call.  Left a message to call us back. 

## 2014-08-25 NOTE — Telephone Encounter (Signed)
Medication management - left patient a message after she had left 2 messages again this date to inform her Prozac order was e-scribed to [facility] on 08/24/14 but her Clonazepam 30 day order had to be faxed in as they do not allow call-ins for this medication.  Requested patient check back with [facility] in a few days and then call back if still was not showing up at their pharmacy.

## 2014-08-26 ENCOUNTER — Ambulatory Visit (HOSPITAL_COMMUNITY): Payer: Self-pay | Admitting: Clinical

## 2014-08-27 ENCOUNTER — Telehealth (HOSPITAL_COMMUNITY): Payer: Self-pay

## 2014-08-27 NOTE — Telephone Encounter (Signed)
Medication management - Followed up with [facility] after patient left 6 messages the previous night with concern they are stating they did not receive our faxed new prescription for Clonazepam faxed on 08/24/14.  Spoke with Dondra Prader with [facility] who reported the prescription needed to be faxed to their [facility] which was a different fax number.  Faxed prescription written 08/24/14 now to [phone] which is the [employer] fax number given to this nurse by Dondra Prader, representative.  Called patient back and informed she did not need to leave 6 messages about the same concern but to trust this nurse and our staff would follow up with her concerns in a timely manner.  Informed that her prescriptions faxed in had to go to a different fax number under [facility] since patient is covered by [employer].  Informed the prescription was refaxed today to the [employer] number provided [phone]) and verified received. Informed representative with [facility] informed this nurse it would take 24 hours to show up on their system for faxes received but that they should send her a notice once any new order is received.  Informed patient to call us back on Monday 08/30/14 if they had not verified with her the fax sen today to their reported correct [employer] fax number had not be received.  Patient stated she was sorry for leaving so many messages but that she just gets upset and that we need to give her more days in orders to help stop her anxiety.  Informed patient would need to discuss this request with Dr. Lolly Mustache at evaluation on 09/07/14 and patient agreed with plan.

## 2014-08-30 ENCOUNTER — Telehealth (HOSPITAL_COMMUNITY): Payer: Self-pay

## 2014-08-30 NOTE — Telephone Encounter (Signed)
Telephone call with [facility] pharmacist, Molly Maduro to verify order e-scribed in on 08/24/14 and that this order could be shipped to patient.  Informed this order was for 30 days and not 90 as patient was requesting and holding up order and informed patient would be seen on 09/07/14 and Dr. Lolly Mustache would decide then if would do 90 day orders.  Pharmacist verified last 30 day order sent to patient on 08/13/14 and this order would be sent when due as well.  Called patient back who has left 5 messages over the weekend stating [facility] still had not gotten the prescription we had mailed in the previous week two times.  Informed this was not the case, that they had the order but was wanting to know if they could change it to a 90 day supply as she had requested.  Informed Dr. Lolly Mustache did not approve a 90 day order at this time and it would be filled as written.  Informed this would need to be discussed with Dr. Lolly Mustache at evaluation on 09/07/14.  Informed order would be mailed out to her as was her order mailed 08/13/14 for another 30 days and patient stated understanding.

## 2014-08-31 ENCOUNTER — Telehealth (HOSPITAL_COMMUNITY): Payer: Self-pay

## 2014-08-31 NOTE — Telephone Encounter (Signed)
Medication management - Telephone message left for patient this nurse received patient's message with concern [facility] needed a new Trazodone 90 day order.  Informed last order sent to them on 07/22/14 for 90 days and too early to refill at this time.  Will see patient on 09/08/14 and she can discuss with Dr. Lolly Mustache need to send a new 90 day order at that time.  Requested patient call back if any questions.

## 2014-09-01 ENCOUNTER — Ambulatory Visit (INDEPENDENT_AMBULATORY_CARE_PROVIDER_SITE_OTHER): Admitting: Internal Medicine

## 2014-09-01 VITALS — BP 132/80 | HR 95 | Temp 98.0°F | Resp 12 | Wt 165.0 lb

## 2014-09-01 DIAGNOSIS — E139 Other specified diabetes mellitus without complications: Secondary | ICD-10-CM | POA: Diagnosis not present

## 2014-09-01 LAB — LIPID PANEL
CHOL/HDL RATIO: 4
Cholesterol: 161 mg/dL (ref 0–200)
HDL: 45.4 mg/dL (ref >39.00)
LDL Cholesterol: 98 mg/dL (ref 0–99)
NonHDL: 115.43
TRIGLYCERIDES: 85 mg/dL (ref 0.0–149.0)
VLDL: 17 mg/dL (ref 0.0–40.0)

## 2014-09-01 MED ORDER — INSULIN GLARGINE 100 UNIT/ML SOLOSTAR PEN: PEN_INJECTOR | SUBCUTANEOUS | Status: DC

## 2014-09-01 NOTE — Patient Instructions (Addendum)
Please continue: - Metformin 500 mg 2x a day  - WelChol 2 x 625 mg tabs 2x a day  - Lantus 28 units at bedtime - NovoLog before a meal as follows:  - 7 units before a small meal - 8 units before a regular meal  - 9 units before a large meal  - Sliding scale of NovoLog: - 150-175: + 1 unit  - 176-200: + 2 units  - 201-225: + 3 units  - 226-250: + 4 units  - >250: + 5 unit  Please stop at the lab.  Please come back for a follow-up appointment in 3 months

## 2014-09-01 NOTE — Progress Notes (Signed)
Patient ID: [name], female   DOB: 1955-07-31, 59 y.o.   MRN: [id number]  HPI: [name] is a 59 y.o.-year-old female, initially referred by her PCP, Dr. Assunta Found (PA: Lenise Herald), for management of LADA, dx 2005, insulin-dependent since ~2006, controlled, with complications (PN, gastroparesis?). She returns for f/u for her DM2. Last visit 3 mo ago.  Her psychiatrist left practice. She started to see Dr Lolly Mustache. She stopped Xanax and started Klonopin.  Previous HbA1c levels: Office Visit on 04/20/2014  Component Date Value Ref Range Corresponding hemoglobin A1c  . Fructosamine 04/20/2014 388* 190 - 270 umol/L 8.2%.   Office Visit on 10/29/2013  Component Date Value Ref Range   . Fructosamine 10/29/2013 418* 190 - 270 umol/L 8.7%    Lab Results  Component Value Date   HGBA1C 4.6 07/16/2013   HGBA1C sent to [facility] 07/16/2013   HGBA1C 4.8 04/15/2012  - 02/12/2013: 4.2% - 12/19/2012: 5.2% - 09/18/2012: 6.5%  She is now on: - Metformin 500 mg 2x a day  - WelChol 2 x 625 mg tabs 2x a day  - Lantus 28 units at bedtime - NovoLog before a meal as follows:  - 7 units before a small meal - 8 units before a regular meal  - 9 units before a large meal  Sliding scale of NovoLog: - 150-175: + 1 unit  - 176-200: + 2 units  - 201-225: + 3 units  - 226-250: + 4 units  - >250: + 5 unit  Pt checks her sugars 4-6 a day -higher when overeats, lower when skips meals: - am: 149-240, 280, 333 >> 86-188, 262 >> 157-231, 265 >> 100-222 >> 64-140, 180, 228 - 2h after b'fast: 216 >> n/c >> 193-259, 299 >> 89-131 >> 124-239 >> 159-253 >> 63-109, 268 - before lunch: 49, 57, 83-152, 188 >> 110-175, 222, 239 >> 107-150, 190, 253 >> 55, 69-163, 190, 270, 273 - 2h after lunch: 146, 169 >> 98-145, 258 >> 74-110, 198 >> 156, 159 >> 140-218 >> 68-155 - before dinner: 44, 49, 61-144 >> 96, 129, 243-269 >> 72, 130-290 >> 40, 45, 100-209, 273 - 2h after dinner: 176-296 >> 117 >> n/c >>  142 >> 101, 149 >> 118, 208, 271 >> 160 >> 146 - bedtime: 101-188, 262 >> 105-321 (eats a pack of nabs after dinner) >> 83-203 >> 58, 81-200, 210, 251 - at night: 135-193 >> 37 x1, 110 >> n/c >> 108, 228 >> n/c Has lows. Lowest sugar was 53 >> 40 (did not eat); she has hypoglycemia awareness at 50. Highest sugar was 311 >> 240s >> 325 >> 273  Pt's meals are: - Breakfast: toast, bowl of cereal + 2% milk  - Lunch: soup, sometimes sandwich, vegetables - Dinner: chicken/pork/beef + vegetable, no starch - Snacks: sugar free sweets Had DM education in 2012. Her insurance does not pay for nutrition or DM edu classes.  - no CKD, last BUN/creatinine:  Previously: Lab Results  Component Value Date   BUN 16 02/24/2014   CREATININE 0.71 02/24/2014  On Losartan. - last set of lipids:  No results found for: CHOL, HDL, LDLCALC, LDLDIRECT, TRIG, CHOLHDL 161/92/46/97 in 03/18/2013.  Not on a statin  - last eye exam was on 11/05/2013 - [place]. No DR.  - + numbness and tingling in her feet. On Neurontin.  Foot exam checked by PCP: 10/2012. She is seeing her podiatrist [facility]).  Pt has a h/o admission for Li toxicity  04/2012. 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 She recently stopped Jordan. Also: GERD, HTN, HL, Fibromyalgia >> on Gabapentin.  I reviewed pt's medications, allergies, PMH, social hx, family hx, and changes were documented in the history of present illness. Otherwise, unchanged from my initial visit note.   ROS: Constitutional: + weight gain, + fatigue, + hot flushes Eyes: + blurry vision, no xerophthalmia ENT: + sore throat, no nodules palpated in throat, no dysphagia/odynophagia, no hoarseness Cardiovascular: no CP/SOB/+ palpitations/no leg swelling Respiratory: no cough/SOB Gastrointestinal: + N/no V/no D/+ C/+ heartburn Musculoskeletal: + muscle/no joint aches Skin: no rashes,+ hair loss, + itching Neurological: + tremors/no  numbness/tingling/dizziness, + HA  PE: BP 132/80 mmHg  Pulse 95  Temp(Src) 98 F (36.7 C) (Oral)  Resp 12  Wt 165 lb (74.844 kg)  SpO2 95% Body mass index is 28.31 kg/(m^2).  Wt Readings from Last 3 Encounters:  09/01/14 165 lb (74.844 kg)  07/22/14 169 lb 12.8 oz (77.021 kg)  06/01/14 168 lb 9.6 oz (76.476 kg)   Constitutional: overweight, in distress b/c feeling nauseated Eyes: PERRLA, EOMI, no exophthalmos ENT: moist mucous membranes, no thyromegaly, no cervical lymphadenopathy Cardiovascular: RRR, No MRG Respiratory: CTA B Gastrointestinal: abdomen soft, NT, ND, BS+ Musculoskeletal: no deformities, strength intact in all 4 Skin: moist, warm, no rashes Neurological: no tremor with outstretched hands, DTR normal in all 4  ASSESSMENT: 1. LADA, insulin-dependent, uncontrolled, with complications - peripheral neuropathy - gastroparesis?  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 She still has a positive C peptide >> still has insulin secretion. For this reason, we continued the Metformin for now. Welchol was started by PCP for cholesterol lowering, so we continued this, also.  Labs from 11/17/2013: - ACR 29.4 - CBC with differential normal, except eosinophiles 6% (0-5) - CMP normal, with a BUN/creatinine ratio of 13/0.55, random glucose 73, GFR >89 - Lipids: 161/92/46/97 - TSH 1.918 - Urinalysis normal, without ketones or glucose  Insurance does not cover nutrition visits...  PLAN:  1. Patient with LADA, on basal-bolus + oral antidiabetic regimen, with improved control lately, but still very fluctuating sugars. She is trying to adjust her diet. -  I suggested to continue current regimen:   Patient Instructions  Please continue: - Metformin 1000 mg 2x a day  - WelChol 3 x 625 mg tabs 2x a day   Please increase: - Lantus to 28 units at bedtime - NovoLog before a meal as follows:  - 7 units before a small meal - 8 units before a regular meal  - 9 units before a large meal   Sliding scale of NovoLog: - 150-175: + 1 unit  - 176-200: + 2 units  - 201-225: + 3 units  - 226-250: + 4 units  - >250: + 5 unit  - continue checking sugars at different times of the day - check 3-4 times a day, rotating checks - up to date with eye exams - will check a fructosamine today, and add a Lipid panel - Return to clinic in 3 mo with sugar  log   Office Visit on 09/01/2014  Component Date Value Ref Range Status  . Cholesterol 09/01/2014 161  0 - 200 mg/dL Final   ATP III Classification       Desirable:  < 200 mg/dL               Borderline High:  200 - 239 mg/dL          High:  > = 161 mg/dL  . Triglycerides 09/01/2014 85.0  0.0 - 149.0 mg/dL Final   Normal:  <096 mg/dLBorderline High:  150 - 199 mg/dL  . HDL 09/01/2014 45.40  >39.00 mg/dL Final  . VLDL 04/54/0981 17.0  0.0 - 40.0 mg/dL Final  . LDL Cholesterol 09/01/2014 98  0 - 99 mg/dL Final  . Total CHOL/HDL Ratio 09/01/2014 4   Final                  Men          Women1/2 Average Risk     3.4          3.3Average Risk          5.0          4.42X Average Risk          9.6          7.13X Average Risk          15.0          11.0                      . NonHDL 09/01/2014 115.43   Final   NOTE:  Non-HDL goal should be 30 mg/dL higher than patient's LDL goal (i.e. LDL goal of < 70 mg/dL, would have non-HDL goal of < 100 mg/dL)  . Fructosamine 09/01/2014 328* 190 - 270 umol/L Final   Calculated hemoglobin A1c from fructosamine is 7.18% - improving. Cholesterol levels are at goal.

## 2014-09-06 LAB — FRUCTOSAMINE: FRUCTOSAMINE: 328 umol/L — AB (ref 190–270)

## 2014-09-07 ENCOUNTER — Ambulatory Visit (INDEPENDENT_AMBULATORY_CARE_PROVIDER_SITE_OTHER): Admitting: Psychiatry

## 2014-09-07 ENCOUNTER — Encounter (HOSPITAL_COMMUNITY): Payer: Self-pay | Admitting: Psychiatry

## 2014-09-07 ENCOUNTER — Encounter: Payer: Self-pay | Admitting: Internal Medicine

## 2014-09-07 DIAGNOSIS — F3131 Bipolar disorder, current episode depressed, mild: Secondary | ICD-10-CM

## 2014-09-07 DIAGNOSIS — F419 Anxiety disorder, unspecified: Secondary | ICD-10-CM | POA: Diagnosis not present

## 2014-09-07 MED ORDER — LAMOTRIGINE 200 MG PO TABS: 200.0000 mg | ORAL_TABLET | Freq: Every day | ORAL | Status: DC

## 2014-09-07 MED ORDER — TRAZODONE HCL 50 MG PO TABS: 50.0000 mg | ORAL_TABLET | Freq: Every day | ORAL | Status: DC

## 2014-09-07 MED ORDER — CLONAZEPAM 1 MG PO TABS: ORAL_TABLET | ORAL | Status: DC

## 2014-09-07 MED ORDER — FLUOXETINE HCL 40 MG PO CAPS: 40.0000 mg | ORAL_CAPSULE | Freq: Every day | ORAL | Status: DC

## 2014-09-07 NOTE — Progress Notes (Signed)
[facility] [id number] Progress Note  [name] [id number] 59 y.o.  09/07/2014 4:21 PM  Chief Complaint:  I like Klonopin.  But I need more refills.    History of Present Illness:  [name] came for her follow-up appointment.  She is 59 year old Caucasian, unemployed married female who has seen Dr. Tomasa Rand in the past for the management of anxiety disorder and bipolar disorder.  She was seen first time in this office on July 21 because her previous psychiatrist Administrator, [employer].  She was taking Lamictal, Prozac and Xanax 2 mg twice a day.  Despite taking the medication she continued to endorse irritability, frustration, poor sleep, racing thought, crying spells and decreased energy level.  She also mentioned having panic attacks and we have recommended to try Klonopin and discontinue Xanax.  She had called multiple times to refill her Klonopin.  Today she felt that Klonopin is much better than Xanax.  Her anxiety is less intense and she denies any major panic attack in past few weeks.  She also denies any impulsive behavior or any insomnia.  She has no tremors or shakes.  However she wants to continue her medication with additional refills because she is using mail pharmacy.  Patient told she save money in that way.  She has no tremors, shakes or any side effects.  We also recommended to cut down Prozac 40 mg because she was having shakes and tremors with 60 mg she has noticed improvement with reducing dose.  She denies any worsening of symptoms.  She lives with her husband who is very supportive.  She is not engaged in any self abusive behavior.  She is not drinking or using any illegal substances.  She denies any hallucination, paranoia, suicidal thoughts.  Her energy level is good.  Her appetite is okay.  Her vitals are stable.  Suicidal Ideation: No Plan Formed: No Patient has means to carry out plan: No  Homicidal Ideation: No Plan Formed: No Patient has means to carry out plan:  No  Past Psychiatric History/Hospitalization(s): Patient is a member seeing psychiatrist at age 69 because of poor impulse control and irritability.  She do not remember the details offer illness very well.  She has seen [name] for many years and then Dr. Tomasa Rand for past 15 years.  In the past she had tried lithium but she developed 2 times lithium toxicity and she is very afraid of lithium.  She had tried Seroquel, Abilify, Depakote, lip to die, Lexapro, Zoloft, Paxil, Ativan and Risperdal.  Patient endorse history of mania and severe mood swings.  She has one psychiatric hospitalization many years ago due to manic episode.  She denies any history of suicidal attempt. Anxiety: Yes Bipolar Disorder: Yes Depression: Yes Mania: Yes Psychosis: No Schizophrenia: No Personality Disorder: No Hospitalization for psychiatric illness: Yes History of Electroconvulsive Shock Therapy: No Prior Suicide Attempts: No  Medical History; Patient has diabetes mellitus with neuropathy, IBS, hypertension, hyperlipidemia. Her primary care physician is [name] at [place].  Patient denies any history of traumatic brain injury, seizures or headaches.  Family History; Patient endorse her mother has mania.  Her aunt has psychiatric illness however she does not know if to take any medication.  Psychosocial History; Patient born and raised in [place].  She  Became pregnant and her high school and later she married.  She's been married with her husband for more than 45 years.  She has one daughter who lives in [place].  Patient  has good contact with her.  The patient admitted that her husband is supportive but she complained that he does not understand her psychiatric illness very well.  Review of Systems  Musculoskeletal: Positive for joint pain.  Skin: Negative for itching and rash.  Neurological:       Nephropathy  Psychiatric/Behavioral:       Forgetful and memory impairment     Psychiatric: Agitation: No Hallucination: No Depressed Mood: No Insomnia: No Hypersomnia: No Altered Concentration: No Feels Worthless: No Grandiose Ideas: No Belief In Special Powers: No New/Increased Substance Abuse: No Compulsions: No  Neurologic: Headache: No Seizure: No Paresthesias: Patient has neuropathy pain   Outpatient Encounter Prescriptions as of 09/07/2014  Medication Sig  . amLODipine (NORVASC) 5 MG tablet Take 5 mg by mouth daily.  . BD PEN NEEDLE NANO U/F 32G X 4 MM MISC   . clonazePAM (KLONOPIN) 1 MG tablet Take 1/2 to 1 tab twice a day as needed for anxiety  . colesevelam (WELCHOL) 625 MG tablet Take 625 mg by mouth 2 (two) times daily with a meal.  . FLUoxetine (PROZAC) 40 MG capsule Take 1 capsule (40 mg total) by mouth daily.  Marland Kitchen gabapentin (NEURONTIN) 600 MG tablet   . glucose blood (FREESTYLE LITE) test strip Check sugars 4x a day. Dx code: E13.9  . insulin aspart (NOVOLOG FLEXPEN) 100 UNIT/ML FlexPen Inject 5-10 Units into the skin 3 (three) times daily with meals.  . Insulin Glargine (LANTUS SOLOSTAR) 100 UNIT/ML Solostar Pen INJECT 28 UNITS UNDER THE SKIN DAILY AT 10 P.M.  . lamoTRIgine (LAMICTAL) 200 MG tablet Take 1 tablet (200 mg total) by mouth daily.  . Lancets (FREESTYLE) lancets Use to test blood sugar 4 to 6 times daily as instructed.  Marland Kitchen losartan (COZAAR) 100 MG tablet Take 100 mg by mouth daily.  . metFORMIN (GLUCOPHAGE) 500 MG tablet Take 500 mg by mouth 2 (two) times daily with a meal.  . omeprazole (PRILOSEC) 40 MG capsule Take 1 capsule (40 mg total) by mouth 2 (two) times daily.  . ondansetron (ZOFRAN ODT) 4 MG disintegrating tablet Take 1 tablet (4 mg total) by mouth 3 (three) times daily with meals.  . promethazine (PHENERGAN) 25 MG tablet Take 25 mg by mouth every 4 (four) hours as needed.   . traMADol (ULTRAM) 50 MG tablet Take 100 mg by mouth every 6 (six) hours as needed (neuropathy).   . traZODone (DESYREL) 50 MG tablet Take 1  tablet (50 mg total) by mouth at bedtime.  . [DISCONTINUED] clonazePAM (KLONOPIN) 1 MG tablet Take 1/2 to 1 tab twice a day as needed for anxiety  . [DISCONTINUED] FLUoxetine (PROZAC) 20 MG capsule Take 3 capsules (60 mg total) by mouth daily.  . [DISCONTINUED] gabapentin (NEURONTIN) 300 MG capsule Take 1 today, then 1 bid on day 2 then 1 tid starting on day 3  . [DISCONTINUED] lamoTRIgine (LAMICTAL) 200 MG tablet Take 1 tablet (200 mg total) by mouth daily.  . [DISCONTINUED] traZODone (DESYREL) 50 MG tablet Take 1 tablet (50 mg total) by mouth at bedtime.   No facility-administered encounter medications on file as of 09/07/2014.    Recent Results (from the past 2160 hour(s))  Lipid Profile     Status: None   Collection Time: 09/01/14 11:33 AM  Result Value Ref Range   Cholesterol 161 0 - 200 mg/dL    Comment: ATP III Classification       Desirable:  < 200 mg/dL  Borderline High:  200 - 239 mg/dL          High:  > = 161 mg/dL   Triglycerides 09.6 0.0 - 149.0 mg/dL    Comment: Normal:  <045 mg/dLBorderline High:  150 - 199 mg/dL   HDL 40.98 >11.91 mg/dL   VLDL 47.8 0.0 - 29.5 mg/dL   LDL Cholesterol 98 0 - 99 mg/dL   Total CHOL/HDL Ratio 4     Comment:                Men          Women1/2 Average Risk     3.4          3.3Average Risk          5.0          4.42X Average Risk          9.6          7.13X Average Risk          15.0          11.0                       NonHDL 115.43     Comment: NOTE:  Non-HDL goal should be 30 mg/dL higher than patient's LDL goal (i.e. LDL goal of < 70 mg/dL, would have non-HDL goal of < 100 mg/dL)  Fructosamine     Status: Abnormal   Collection Time: 09/01/14 11:33 AM  Result Value Ref Range   Fructosamine 328 (H) 190 - 270 umol/L      Constitutional:  There were no vitals taken for this visit.   Musculoskeletal: Strength & Muscle Tone: within normal limits Gait & Station: normal Patient leans: N/A  Psychiatric Specialty Exam: General  Appearance: Casual  Eye Contact::  Fair  Speech:  Normal Rate  Volume:  Normal  Mood:  Anxious  Affect:  Labile  Thought Process:  Logical  Orientation:  Full (Time, Place, and Person)  Thought Content:  Rumination  Suicidal Thoughts:  No  Homicidal Thoughts:  No  Memory:  Immediate;   Fair Recent;   Fair Remote;   Poor  Judgement:  Intact  Insight:  Fair  Psychomotor Activity:  Increased  Concentration:  Fair  Recall:  [employer] of Knowledge:  Fair  Language:  Fair  Akathisia:  No  Handed:  Right  AIMS (if indicated):     Assets:  Communication Skills Desire for Improvement Financial Resources/Insurance Housing Social Support  ADL's:  Intact  Cognition:  WNL  Sleep:        Established Problem, Stable/Improving (1), Review of Psycho-Social Stressors (1), Decision to obtain old records (1), Review of Last Therapy Session (1), Review of Medication Regimen & Side Effects (2) and Review of New Medication or Change in Dosage (2)  Assessment: Axis I: Bipolar disorder mildly depressed, anxiety disorder NOS  Axis II: Deferred  Axis III:  Past Medical History  Diagnosis Date  . Diabetes mellitus without complication   . Depression   . IBS (irritable bowel syndrome)   . HTN (hypertension)   . Bipolar 1 disorder   . Hyperlipidemia   . Breast density 06/25/2012    Right breast density, will get mammogram and [place]  . Duodenitis      Plan:  We are still awaiting records from her primary care physician's office.  She likes Klonopin and I recommended to continue 1 mg half to  one tablet twice a day .  I also recommended to continue Prozac 40 mg daily , Lamictal 200 mg daily and trazodone 50 mg at bedtime.  She has no rash or itching.  She has no tremors, shakes or any EPS.  She scheduled to see therapist on 19th for coping and social skills.  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  Recommended to call us back if she has any question or  any concern.  I will see her again in 3 months.  Discuss safety plan that anytime having active suicidal thoughts or homicidal thoughts and she need to call 911 or go to the local emergency room. Time spent 25 minutes.  More than 50% of the time spent in psychoeducation, counseling and coordination of care.  Mayer Vondrak T., MD 09/07/2014

## 2014-09-14 ENCOUNTER — Telehealth: Payer: Self-pay | Admitting: Internal Medicine

## 2014-09-14 NOTE — Telephone Encounter (Signed)
Left voice message to return call 

## 2014-09-14 NOTE — Telephone Encounter (Signed)
Pt called return [place] phone call, please call pt back

## 2014-09-15 NOTE — Telephone Encounter (Signed)
Called pt and was finally able to speak with her. Pt states that her sugars have been consistently at 200 or over for the past 13 days. Before and after dinner. A few before lunch. Pt had one that was 460, 2 hrs after breakfast this past Sunday. Please advise.

## 2014-09-15 NOTE — Telephone Encounter (Signed)
Did she change her diet? Was she taking steroids? Is she sick? Please tell her to increase mealtime insulin >> if she is mostly using 7 or 8 units, go to 9 or 10 units and add SSI to this new dose. Please let us know how she is doing in several days.

## 2014-09-15 NOTE — Telephone Encounter (Signed)
[facility] note dated 09/15/14  Caller states she never got to speak to office today-was having trouble with a new phone, is asking for a call back again tomorrow. Declined triage.

## 2014-09-15 NOTE — Telephone Encounter (Signed)
Called pt and advised her per Dr Gherghe's message. Pt voiced understanding.  

## 2014-09-20 ENCOUNTER — Ambulatory Visit (INDEPENDENT_AMBULATORY_CARE_PROVIDER_SITE_OTHER): Admitting: Clinical

## 2014-09-20 ENCOUNTER — Encounter (HOSPITAL_COMMUNITY): Payer: Self-pay | Admitting: Clinical

## 2014-09-20 DIAGNOSIS — F410 Panic disorder [episodic paroxysmal anxiety] without agoraphobia: Secondary | ICD-10-CM | POA: Diagnosis not present

## 2014-09-20 DIAGNOSIS — F3130 Bipolar disorder, current episode depressed, mild or moderate severity, unspecified: Secondary | ICD-10-CM | POA: Diagnosis not present

## 2014-09-20 DIAGNOSIS — F4321 Adjustment disorder with depressed mood: Secondary | ICD-10-CM
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
## 2014-09-23 ENCOUNTER — Telehealth: Payer: Self-pay | Admitting: Internal Medicine

## 2014-09-23 NOTE — Telephone Encounter (Signed)
Called [name] and advised her per Dr Charlean Sanfilippo message below. Advised [name] to call on Monday and let us know how her sugar levels are.

## 2014-09-23 NOTE — Telephone Encounter (Signed)
Please increase Lantus to 30, and then 32 units in 3 days if sugars not improved. Let us know how this goes, but I need a log for further adjustments. We may need to schedule her for a visit.

## 2014-09-23 NOTE — Telephone Encounter (Signed)
Please read message below and advise.  

## 2014-09-23 NOTE — Telephone Encounter (Signed)
Patient called stating that her blood sugars over the past 12 days have been over 200's She claims that it happened over 12 times and had 2 readings over 300 She denies eating any sweets and has been portion controlling her meals  [name] admitted to even exercising daily on her bike   Please advise patient    Thank you

## 2014-09-24 ENCOUNTER — Telehealth: Payer: Self-pay | Admitting: Internal Medicine

## 2014-09-24 NOTE — Telephone Encounter (Signed)
Called pt and lvm advising her per Dr Gherghe's message below.  

## 2014-09-24 NOTE — Telephone Encounter (Signed)
OK to go back to Metformin 1000 mg bid.

## 2014-09-24 NOTE — Telephone Encounter (Signed)
Please read message below and advise.  

## 2014-09-24 NOTE — Telephone Encounter (Signed)
Patient called stating that she has been having issues with her sugars  She states this did not occur until she started halving her metformin  Mrs. [name] would like to return back to her old dosage    Please advise   Thank you

## 2014-10-20 ENCOUNTER — Telehealth: Payer: Self-pay | Admitting: Internal Medicine

## 2014-10-20 ENCOUNTER — Telehealth (HOSPITAL_COMMUNITY): Payer: Self-pay

## 2014-10-20 NOTE — Telephone Encounter (Signed)
Please read message below and advise.  

## 2014-10-20 NOTE — Telephone Encounter (Signed)
Medication management - Telephone call with patient to follow up on message she left her anxiety had been more recently with periods of worsening depression and fear.  Patient reported she was experiencing IRB episode that had hightened anxiety currently and had left a message for her PCP to see if there was anything they wanted to give her to help with stated bad case of IBS.  Patient admitted this may be contributing to increased symptoms of anxiety increase and depression lately and agreed to inform Dr. Lolly MustacheArfeen to question if he wanted to change anything as patient stated she did not think Klonopin was always that helpful.  Agreed to let patient know if any changes and requested patient make sure to follow up with her PCP for IBS.  Patient to call back as needed and agreed to send note to Dr Lolly MustacheArfeen about concerns.

## 2014-10-20 NOTE — Telephone Encounter (Signed)
Called pt and advised her per Dr Gherghe's message. Pt voiced understanding.  

## 2014-10-20 NOTE — Telephone Encounter (Signed)
Patient has irritable bile syndrome, could it affect her B/S, please advise

## 2014-10-20 NOTE — Telephone Encounter (Signed)
It can, please continue to monitor sugars. Let me know if they are consistently higher or lower than normal.

## 2014-10-21 ENCOUNTER — Ambulatory Visit (HOSPITAL_COMMUNITY): Payer: Self-pay | Admitting: Clinical

## 2014-10-21 NOTE — Telephone Encounter (Signed)
She need to see primary care physician for the management of IBS which causing anxiety.

## 2014-10-21 NOTE — Telephone Encounter (Signed)
Telephone call with patient to inform Dr. Lolly MustacheArfeen did not want to change her medication but that patient should follow up with her PCP for increased problems with irritable bowel syndrome.  Patient stated she did and was doing "better" today.  States she is on 6 different medications to treat it but is better and understands Dr. Sheela StackArfeen's response.  Patient to call back as needed and will see next on 12/07/14.

## 2014-10-27 ENCOUNTER — Telehealth: Payer: Self-pay | Admitting: *Deleted

## 2014-10-27 ENCOUNTER — Telehealth: Payer: Self-pay | Admitting: Internal Medicine

## 2014-10-27 NOTE — Telephone Encounter (Signed)
She can increase the Lantus to 30 or 32, but please pay attention to the sugars and if lower, need to decrease the dose back to 28 units.

## 2014-10-27 NOTE — Telephone Encounter (Signed)
Called pt and advised her per Dr Gherghe's message. Pt voiced understanding.  

## 2014-10-27 NOTE — Telephone Encounter (Signed)
Pt called stating her blood sugars have been all over the place:  AM   211, 195, 198, 325, 196 Before lunch  215 (and all good numbers) Before dinner  177, 209, 220, 198 Bedtime  80, 132, 305, 264, 163, 196  Please advise of any changes.

## 2014-10-27 NOTE — Telephone Encounter (Signed)
Pt calling to let you know that the numbers the wk of 10/2-10/15 are much lower than the numbers she just gave you

## 2014-10-27 NOTE — Telephone Encounter (Signed)
error 

## 2014-11-08 ENCOUNTER — Telehealth: Payer: Self-pay | Admitting: Internal Medicine

## 2014-11-08 NOTE — Telephone Encounter (Signed)
Called pt and lvm advising her that she did not call back to let us know where to send her lancing supplies. Advised pt to call back tomorrow.

## 2014-11-08 NOTE — Telephone Encounter (Signed)
Pt lost lancing supplies please call in new rx she is calling back to let us know where to call it into

## 2014-11-15 ENCOUNTER — Ambulatory Visit (INDEPENDENT_AMBULATORY_CARE_PROVIDER_SITE_OTHER): Admitting: Clinical

## 2014-11-15 DIAGNOSIS — F3130 Bipolar disorder, current episode depressed, mild or moderate severity, unspecified: Secondary | ICD-10-CM | POA: Diagnosis not present

## 2014-11-15 DIAGNOSIS — F4321 Adjustment disorder with depressed mood: Secondary | ICD-10-CM

## 2014-11-15 DIAGNOSIS — F410 Panic disorder [episodic paroxysmal anxiety] without agoraphobia: Secondary | ICD-10-CM | POA: Diagnosis not present

## 2014-11-20 ENCOUNTER — Encounter (HOSPITAL_COMMUNITY): Payer: Self-pay | Admitting: Clinical

## 2014-11-20 NOTE — Progress Notes (Signed)
   THERAPIST PROGRESS NOTE  Session Time: 1:30 -2:30  Participation Level: Active  Behavioral Response: CasualAlertDepressed  Type of Therapy: Individual Therapy  Treatment Goals addressed: improve psychiatric symptoms,   Interventions: Motivational Interviewing  Summary: [name] is a 59 y.o. female who presents with Bipolar I Disorder, unresolved grief and Panic disorder.  Suicidal/Homicidal: Nowithout intent/plan  Therapist Response:  [name] met with clinician for an individual session. She discussed her psychiatric symptoms and her current life events. She shared that she had been doing a little better since last session. She shared that her husband has been more engaged which helps her to feel better. She shared that he was more understanding about the loss of her boyfriend and has come back around to appreciating their deep friendship. She shared about the shift in their relationship. She shared that she would like to learn to regulate her emotions more effectively. Clinician introduced some grounding and mindfulness techniques.Clinician explained the practice, purpose and application of the techniques. Client and clinician practiced some of the techniques together. [name] agreed to practice the techniques daily until next session  Plan: Return again in 1-2 weeks.  Diagnosis: Axis I: Bipolar I Disorder, unresolved grief and Panic disorder        , A, LCSW 11/20/2014  

## 2014-11-23 ENCOUNTER — Ambulatory Visit (INDEPENDENT_AMBULATORY_CARE_PROVIDER_SITE_OTHER): Admitting: Internal Medicine

## 2014-11-23 ENCOUNTER — Encounter: Payer: Self-pay | Admitting: Internal Medicine

## 2014-11-23 VITALS — BP 140/80 | HR 92 | Temp 98.4°F | Resp 14 | Wt 169.0 lb

## 2014-11-23 DIAGNOSIS — E139 Other specified diabetes mellitus without complications: Secondary | ICD-10-CM

## 2014-11-23 MED ORDER — INSULIN GLARGINE 100 UNIT/ML SOLOSTAR PEN
PEN_INJECTOR | SUBCUTANEOUS | Status: DC
Start: 2014-11-23 — End: 2015-02-23

## 2014-11-23 NOTE — Progress Notes (Signed)
Patient ID: [name], female   DOB: 1955/02/16, 59 y.o.   MRN: [id number]  HPI: [name] is a 59 y.o.-year-old female, initially referred by her PCP, Dr. Assunta Found (PA: Lenise Herald), for management of LADA, dx 2005, insulin-dependent since ~2006, controlled, with complications (PN, gastroparesis?). She returns for f/u for her DM2. Last visit 3 mo ago.  She still has problems with IBS.  Previous fructosamine with corresponding HbA1c levels: . Fructosamine 09/01/2014 328* 190 - 270 umol/L 7.18%   . Fructosamine 04/20/2014 388* 190 - 270 umol/L 8.2%.   . Fructosamine 10/29/2013 418* 190 - 270 umol/L 8.7%    Lab Results  Component Value Date   HGBA1C 4.6 07/16/2013   HGBA1C sent to [facility] 07/16/2013   HGBA1C 4.8 04/15/2012  - 02/12/2013: 4.2% - 12/19/2012: 5.2% - 09/18/2012: 6.5%  She is now on: - Metformin 500 mg 2x a day  - WelChol 1 x 625 mg tabs 2x a day  - Lantus 28 >> 34 units at bedtime - NovoLog before a meal as follows:  - 7 units before a small meal - 8 units before a regular meal  - 9 units before a large meal  Sliding scale of NovoLog: - 150-175: + 1 unit  - 176-200: + 2 units  - 201-225: + 3 units  - 226-250: + 4 units  - >250: + 5 unit  Pt checks her sugars 4-6 a day: - am: 149-240, 280, 333 >> 86-188, 262 >> 157-231, 265 >> 100-222 >> 64-140, 180, 228 >> 57x1, 112-182, 208, 325 - 2h after b'fast: 216 >> n/c >> 193-259, 299 >> 89-131 >> 124-239 >> 159-253 >> 63-109, 268 >> 101-208 - before lunch: 49, 57, 83-152, 188 >> 110-175, 222, 239 >> 107-150, 190, 253 >> 55, 69-163, 190, 270, 273 >> 44, 52-156 - 2h after lunch: 146, 169 >> 98-145, 258 >> 74-110, 198 >> 156, 159 >> 140-218 >> 68-155 >> 48, 297 - before dinner: 44, 49, 61-144 >> 96, 129, 243-269 >> 72, 130-290 >> 40, 45, 100-209, 273 >> 98-173, 200 - 2h after dinner: 176-296 >> 117 >> n/c >> 142 >> 101, 149 >> 118, 208, 271 >> 160 >> 146 >> 178 - bedtime: 101-188, 262 >> 105-321 (eats a  pack of nabs after dinner) >> 83-203 >> 58, 81-200, 210, 251 >> 68, 99-231 - at night: 135-193 >> 37 x1, 110 >> n/c >> 108, 228 >> n/c >> 41 x1, 75-202 Has lows. Lowest sugar was 53 >> 40 (did not eat) >> 41,44; she has hypoglycemia awareness at 50. Highest sugar was 311 >> 240s >> 325 >> 273 >> 347  Pt's meals are: - Breakfast: toast, bowl of cereal + 2% milk  - Lunch: soup, sometimes sandwich, vegetables - Dinner: chicken/pork/beef + vegetable, no starch - Snacks: sugar free sweets Had DM education in 2012. Her insurance does not pay for nutrition or DM edu classes.  - no CKD, last BUN/creatinine:  Previously: Lab Results  Component Value Date   BUN 16 02/24/2014   CREATININE 0.71 02/24/2014  On Losartan. - last set of lipids:  Lab Results  Component Value Date   CHOL 161 09/01/2014   HDL 45.40 09/01/2014   LDLCALC 98 09/01/2014   TRIG 85.0 09/01/2014   CHOLHDL 4 09/01/2014  161/92/46/97 in 03/18/2013.  Not on a statin. She is on Welchol. She c/o constipation. - last eye exam was on 11/05/2013 - [place]. No DR.  - + numbness and  tingling in her feet. On Neurontin.  Foot exam checked by PCP: 10/2012. She is seeing her podiatrist [facility]).  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 She recently stopped JordanLatuda. Also: GERD, HTN, HL, Fibromyalgia >> on Gabapentin.  I reviewed pt's medications, allergies, PMH, social hx, family hx, and changes were documented in the history of present illness. Otherwise, unchanged from my initial visit note.   ROS: Constitutional: + weight gain, + fatigue, + hot flushes, + poor sleep Eyes: + blurry vision, no xerophthalmia ENT: no sore throat, no nodules palpated in throat, no dysphagia/odynophagia, no hoarseness Cardiovascular: no CP/SOB/palpitations/no leg swelling Respiratory: no cough/SOB Gastrointestinal: no N/V/D/+ C/+ heartburn Musculoskeletal: + muscle/no  joint aches Skin: no rashes,+ hair loss, + itching Neurological: + tremors/no numbness/tingling/dizziness, + HA  PE: BP 140/80 mmHg  Pulse 92  Temp(Src) 98.4 F (36.9 C) (Oral)  Resp 14  Wt 169 lb (76.658 kg)  SpO2 95% Body mass index is 28.99 kg/(m^2).  Wt Readings from Last 3 Encounters:  11/23/14 169 lb (76.658 kg)  09/01/14 165 lb (74.844 kg)  07/22/14 169 lb 12.8 oz (77.021 kg)   Constitutional: overweight, in distress b/c feeling nauseated Eyes: PERRLA, EOMI, no exophthalmos ENT: moist mucous membranes, no thyromegaly, no cervical lymphadenopathy Cardiovascular: RRR, No MRG Respiratory: CTA B Gastrointestinal: abdomen soft, NT, ND, BS+ Musculoskeletal: no deformities, strength intact in all 4 Skin: moist, warm, no rashes Neurological: no tremor with outstretched hands, DTR normal in all 4  ASSESSMENT: 1. LADA, insulin-dependent, uncontrolled, with complications - peripheral neuropathy - gastroparesis?  Component     Latest Ref Rng 07/16/2013  C-Peptide     0.80 - 3.90 ng/mL 1.29  Glucose     70 - 99 mg/dL 161183 (H)  Glutamic Acid Decarb Ab     <=1.0 U/mL 11.3 (H)  Pancreatic Islet Cell Antibody     <5 JDF Units 5 (A)  Pt appears to have anti-pancreatic antibodies >> LADA rather than type 2 DM dx after last visit. She still has a positive C peptide >> still has insulin secretion. For this reason, we continued the Metformin for now. Welchol was started by PCP for cholesterol lowering, so we continued this, also.  Labs from 11/17/2013: - ACR 29.4 - CBC with differential normal, except eosinophiles 6% (0-5) - CMP normal, with a BUN/creatinine ratio of 13/0.55, random glucose 73, GFR >89 - Lipids: 161/92/46/97 - TSH 1.918 - Urinalysis normal, without ketones or glucose  Insurance does not cover nutrition visits...  PLAN:  1. Patient with LADA, on basal-bolus + oral antidiabetic regimen, with still very fluctuating sugars. She is trying to adjust her diet. She  has some low CBGs still >> will decrease Lantus and also relax her SSI as she can drop CBGs more after correction of high CBGs. As she has constipation and Lipids are at goal >> will stop Welchol (now on a low dose, anyway). -  I suggested to:  Patient Instructions  Please stop Welchol.  Please decrease: - Lantus to 32 units at bedtime.  Please continue: - Metformin 500 mg 2x a day  - NovoLog before a meal as follows:  - 7 units before a small meal - 8 units before a regular meal  - 9 units before a large meal  Please change: - Sliding scale of NovoLog: - 150-200: + 1 unit  - 201-250: + 2 units  - 251-300: +  3 units  - >300: + 4 units   Please come back for a follow-up appointment in 3 months  - continue checking sugars at different times of the day - check 3-4 times a day, rotating checks - needs a new eye exam - will check a fructosamine today - Return to clinic in 3 mo with sugar log   Office Visit on 11/23/2014  Component Date Value Ref Range Status  . Fructosamine 11/23/2014 332* 190 - 270 umol/L Final  Calculated HbA1c: 7.25%, slightly higher than before, but close to goal.

## 2014-11-23 NOTE — Patient Instructions (Signed)
Please stop Welchol.  Please decrease: - Lantus to 32 units at bedtime.  Please continue: - Metformin 500 mg 2x a day  - NovoLog before a meal as follows:  - 7 units before a small meal - 8 units before a regular meal  - 9 units before a large meal  Please change: - Sliding scale of NovoLog: - 150-200: + 1 unit  - 201-250: + 2 units  - 251-300: + 3 units  - >300: + 4 units   Please come back for a follow-up appointment in 3 months

## 2014-11-26 LAB — FRUCTOSAMINE: FRUCTOSAMINE: 332 umol/L — AB (ref 190–270)

## 2014-11-29 ENCOUNTER — Telehealth: Payer: Self-pay | Admitting: Internal Medicine

## 2014-11-29 NOTE — Telephone Encounter (Signed)
Called pt and spoke with her about the OV notes and her A1c.

## 2014-11-29 NOTE — Telephone Encounter (Signed)
Patient would like to know what her last A1C.is.

## 2014-11-29 NOTE — Telephone Encounter (Signed)
Pt calling with questions regarding office notes and AVS, sliding scale, and parkinson's disease?

## 2014-11-29 NOTE — Telephone Encounter (Signed)
error 

## 2014-12-02 ENCOUNTER — Other Ambulatory Visit (HOSPITAL_COMMUNITY): Payer: Self-pay | Admitting: Psychiatry

## 2014-12-07 ENCOUNTER — Encounter (HOSPITAL_COMMUNITY): Payer: Self-pay | Admitting: Psychiatry

## 2014-12-07 ENCOUNTER — Ambulatory Visit (INDEPENDENT_AMBULATORY_CARE_PROVIDER_SITE_OTHER): Admitting: Psychiatry

## 2014-12-07 VITALS — BP 149/85 | HR 91 | Ht 64.0 in | Wt 169.0 lb

## 2014-12-07 DIAGNOSIS — F3131 Bipolar disorder, current episode depressed, mild: Secondary | ICD-10-CM

## 2014-12-07 DIAGNOSIS — F419 Anxiety disorder, unspecified: Secondary | ICD-10-CM | POA: Diagnosis not present

## 2014-12-07 MED ORDER — FLUOXETINE HCL 40 MG PO CAPS: 40.0000 mg | ORAL_CAPSULE | Freq: Every day | ORAL | Status: DC

## 2014-12-07 MED ORDER — LAMOTRIGINE 200 MG PO TABS: 200.0000 mg | ORAL_TABLET | Freq: Every day | ORAL | Status: DC

## 2014-12-07 MED ORDER — CLONAZEPAM 1 MG PO TABS: ORAL_TABLET | ORAL | Status: DC

## 2014-12-07 NOTE — Telephone Encounter (Signed)
Discontinued due to constipation.  Patient stopped taking it.

## 2014-12-07 NOTE — Progress Notes (Signed)
[facility] [id number] Progress Note  Sabrina ComptonBarbara L Mccaffrey [id number] 59 y.o.  12/07/2014 2:48 PM  Chief Complaint:  I stop taking trazodone because I'm sleeping better.      History of Present Illness:  Sabrina MccreedyBarbara came for her follow-up appointment.  She is no longer taking trazodone because she sleeping better.  She is complaining of constipation and she stopped taking narcotic pain medication because of constipation.  Overall she described her mood better she still have some time anxiety and nervousness but she denies any irritability, anger, mood swing.  She denies any recent crying spells or any paranoia.  She like Klonopin.  She started counseling with Tomma LightningFrankie which helped her coping skills.  She denies any feeling of hopelessness or worthlessness.  She has no rash or itching with the Lamictal.  She denies any severe mood swing or any anger issues.  She wants to continue her Prozac , Lamictal and Klonopin.  She is also taking Neurontin which is prescribed by her primary care physician for chronic pain.  She is not engaged in any self abusive behavior.  She denies drinking or using any illegal substances.  Her appetite is okay.  Her vitals are stable.  Patient had a good Thanksgiving and she was [facility]  She lives with her husband is very supportive.  Suicidal Ideation: No Plan Formed: No Patient has means to carry out plan: No  Homicidal Ideation: No Plan Formed: No Patient has means to carry out plan: No  Past Psychiatric History/Hospitalization(s): Patient remember seeing psychiatrist at age 59 because of poor impulse control and irritability.  She do not remember the details offer illness very well.  She has seen Elna Breslowoug Smith for many years and then Dr. Tomasa Randunningham for past 15 years.  In the past she had tried lithium but she developed 2 times lithium toxicity and she is very afraid of lithium.  She had tried Seroquel, Abilify, Depakote, Lexapro, Zoloft,  Paxil, Ativan and Risperdal.  Patient endorse history of mania and severe mood swings.  She has one psychiatric hospitalization many years ago due to manic episode.  She denies any history of suicidal attempt. Anxiety: Yes Bipolar Disorder: Yes Depression: Yes Mania: Yes Psychosis: No Schizophrenia: No Personality Disorder: No Hospitalization for psychiatric illness: Yes History of Electroconvulsive Shock Therapy: No Prior Suicide Attempts: No  Medical History; Patient has diabetes mellitus with neuropathy, IBS, hypertension, hyperlipidemia. Her primary care physician [facility].  Patient denies any history of traumatic brain injury, seizures or headaches.  Family History; Patient endorse her mother has mania.  Her aunt has psychiatric illness however she does not know if to take any medication.  Review of Systems  Musculoskeletal: Positive for joint pain.  Skin: Negative for itching and rash.  Neurological:       Nephropathy  Psychiatric/Behavioral:       Forgetful and memory impairment    Psychiatric: Agitation: No Hallucination: No Depressed Mood: No Insomnia: No Hypersomnia: No Altered Concentration: No Feels Worthless: No Grandiose Ideas: No Belief In Special Powers: No New/Increased Substance Abuse: No Compulsions: No  Neurologic: Headache: No Seizure: No Paresthesias: Patient has neuropathy pain   Outpatient Encounter Prescriptions as of 12/07/2014  Medication Sig  . amLODipine (NORVASC) 5 MG tablet Take 5 mg by mouth daily.  . BD PEN NEEDLE NANO U/F 32G X 4 MM MISC   . clonazePAM (KLONOPIN) 1 MG tablet Take 1/2 to 1 tab twice a  day as needed for anxiety  . FLUoxetine (PROZAC) 40 MG capsule Take 1 capsule (40 mg total) by mouth daily.  Marland Kitchen gabapentin (NEURONTIN) 600 MG tablet   . glucose blood (FREESTYLE LITE) test strip Check sugars 4x a day. Dx code: E13.9  . insulin aspart (NOVOLOG FLEXPEN) 100 UNIT/ML FlexPen Inject 5-10 Units into the  skin 3 (three) times daily with meals.  . Insulin Glargine (LANTUS SOLOSTAR) 100 UNIT/ML Solostar Pen INJECT 32 UNITS UNDER THE SKIN DAILY AT 10 P.M.  . lamoTRIgine (LAMICTAL) 200 MG tablet Take 1 tablet (200 mg total) by mouth daily.  . Lancets (FREESTYLE) lancets Use to test blood sugar 4 to 6 times daily as instructed.  Marland Kitchen losartan (COZAAR) 100 MG tablet Take 100 mg by mouth daily.  . metFORMIN (GLUCOPHAGE) 500 MG tablet Take 500 mg by mouth 2 (two) times daily with a meal.  . omeprazole (PRILOSEC) 40 MG capsule Take 1 capsule (40 mg total) by mouth 2 (two) times daily.  . ondansetron (ZOFRAN ODT) 4 MG disintegrating tablet Take 1 tablet (4 mg total) by mouth 3 (three) times daily with meals.  . promethazine (PHENERGAN) 25 MG tablet Take 25 mg by mouth every 4 (four) hours as needed.   . [DISCONTINUED] clonazePAM (KLONOPIN) 1 MG tablet Take 1/2 to 1 tab twice a day as needed for anxiety  . [DISCONTINUED] FLUoxetine (PROZAC) 40 MG capsule Take 1 capsule (40 mg total) by mouth daily.  . [DISCONTINUED] lamoTRIgine (LAMICTAL) 200 MG tablet Take 1 tablet (200 mg total) by mouth daily.  . [DISCONTINUED] traMADol (ULTRAM) 50 MG tablet Take 100 mg by mouth every 6 (six) hours as needed (neuropathy).   . [DISCONTINUED] traZODone (DESYREL) 50 MG tablet Take 1 tablet (50 mg total) by mouth at bedtime.   No facility-administered encounter medications on file as of 12/07/2014.    Recent Results (from the past 2160 hour(s))  Fructosamine     Status: Abnormal   Collection Time: 11/23/14 11:27 AM  Result Value Ref Range   Fructosamine 332 (H) 190 - 270 umol/L      Constitutional:  BP 149/85 mmHg  Pulse 91  Ht  (1.626 m)  Wt 169 lb (76.658 kg)  BMI 28.99 kg/m2   Musculoskeletal: Strength & Muscle Tone: within normal limits Gait & Station: normal Patient leans: N/A  Psychiatric Specialty Exam: General Appearance: Casual  Eye Contact::  Fair  Speech:  Normal Rate  Volume:  Normal   Mood:  Anxious  Affect:  Labile  Thought Process:  Logical  Orientation:  Full (Time, Place, and Person)  Thought Content:  Rumination  Suicidal Thoughts:  No  Homicidal Thoughts:  No  Memory:  Immediate;   Fair Recent;   Fair Remote;   Poor  Judgement:  Intact  Insight:  Fair  Psychomotor Activity:  Normal  Concentration:  Fair  Recall:  [employer] of Knowledge:  Fair  Language:  Fair  Akathisia:  No  Handed:  Right  AIMS (if indicated):     Assets:  Communication Skills Desire for Improvement Financial Resources/Insurance Housing Social Support  ADL's:  Intact  Cognition:  WNL  Sleep:        Established Problem, Stable/Improving (1), Review of Psycho-Social Stressors (1), Review of Last Therapy Session (1), Review of Medication Regimen & Side Effects (2) and Review of New Medication or Change in Dosage (2)  Assessment: Axis I: Bipolar disorder mildly depressed, anxiety disorder NOS  Axis II: Deferred  Axis III:  Past Medical History  Diagnosis Date  . Diabetes mellitus without complication (HCC)   . Depression   . IBS (irritable bowel syndrome)   . HTN (hypertension)   . Bipolar 1 disorder (HCC)   . Hyperlipidemia   . Breast density 06/25/2012    Right breast density, will get mammogram and [place]  . Duodenitis     Plan:  Patient doing better on her current psychiatric medication.  I will discontinue trazodone as patient is sleeping better and does not want to take anything that cause constipation.  I suggested if her anxiety does get worse then she should try increasing gabapentin which is prescribed by her primary care physician.  Continue Lamictal 200 mg daily, Prozac 40 mg daily and Klonopin 1 mg twice a day.  Discussed medication side effects and benefits.  Discussed benzodiazepine dependence tolerance and withdrawal .  Encouraged to keep appointment with [name] for coping and social skills.  Follow-up in 3 months.  Recommended to call us back if she has any  question or any concern.  Lovelle Deitrick T., MD 12/07/2014

## 2014-12-08 ENCOUNTER — Encounter (HOSPITAL_COMMUNITY): Payer: Self-pay | Admitting: Clinical

## 2014-12-08 ENCOUNTER — Ambulatory Visit (INDEPENDENT_AMBULATORY_CARE_PROVIDER_SITE_OTHER): Admitting: Clinical

## 2014-12-08 DIAGNOSIS — F3130 Bipolar disorder, current episode depressed, mild or moderate severity, unspecified: Secondary | ICD-10-CM | POA: Diagnosis not present

## 2014-12-08 DIAGNOSIS — F410 Panic disorder [episodic paroxysmal anxiety] without agoraphobia: Secondary | ICD-10-CM | POA: Diagnosis not present

## 2014-12-08 DIAGNOSIS — F4321 Adjustment disorder with depressed mood: Secondary | ICD-10-CM | POA: Diagnosis not present

## 2014-12-08 NOTE — Progress Notes (Signed)
   THERAPIST PROGRESS NOTE  Session Time: 11:00 -11:55  Participation Level: Active  Behavioral Response: CasualAlertDepressed  Type of Therapy: Individual Therapy  Treatment Goals addressed: Improve psychiatric symptoms, elevate mood (increased confidence, trust own decisions), improve unhelpful thought patterns, decrease anxiety (increase activity outside the home).  Interventions: CBT and Motivational Interviewing  Summary: [name] is [name] 59 y.o. female who presents with Bipolar I Disorder, unresolved grief and Panic disorder. .   Suicidal/Homicidal: Nowithout intent/plan  Therapist ResponsePamala [name] met with clinician for an individual session. She shared about her psychiatric symptoms, her current life events and her homework. [name] had completed 2 packets on self-esteem. Client and clinician reviewed and discussed the packets. [name] shared [name] bout the things she related to and how she thought it affected her life. Client and clinician discussed ways to improve the negative thoughts that she carried from childhood. [name] shared that she holds some resentments towards her mother though she would like to forgive her. Her mother was mentally unsound and [name] suffered because of it. Client and clinician discussed how he could work on for giving her mother. [name] shared that she was able to spend Thanksgiving with her daughter. She shared that she worked through her fears [name] bout being in pain there and actually went and enjoyed herself. Client and clinician discussed how she was able to do that. Client and clinician also discussed the fact that [name] shared that she was in less pain when she was with her daughter. Client and clinician discussed how where we put her focus makes [name] difference on what we experience. Client and clinician discussed how she could use her thoughts and imagination to help her manage her stress and pain better. [name] agreed to continue with her packet  homework and bring it back with her next session.  Plan: Return again in 2 weeks.  Diagnosis: Axis I: Bipolar I Disorder, unresolved grief and Panic disorder.      Sabrina Morgano A, LCSW 12/08/2014

## 2014-12-15 LAB — HM DIABETES EYE EXAM

## 2014-12-20 ENCOUNTER — Telehealth: Payer: Self-pay | Admitting: Internal Medicine

## 2014-12-20 NOTE — Telephone Encounter (Signed)
Patient called stating she ate a big bowl cereal her sugar spiked to 317 She took a shot and its going down   Should she stop Vanilla flavored Rice Chex ?   Please advise patient   Thank you

## 2014-12-20 NOTE — Telephone Encounter (Signed)
Spoke with patient reviewed portion size and carb intake . Advised patient she needs to follow diabetic diet watch carb intake and portion sizes

## 2014-12-22 ENCOUNTER — Telehealth (HOSPITAL_COMMUNITY): Payer: Self-pay

## 2014-12-22 NOTE — Telephone Encounter (Signed)
Telephone call with patient to follow up on her request for Clonazepam medication to also be filled with [facility] and informed patient Dr. Lolly MustacheArfeen had already sent this order to them.  Patient stated she had checked with [facility] and had gotten this message.  Patient agreed to call back if any further problems or concerns.

## 2014-12-22 NOTE — Telephone Encounter (Signed)
Telephone call with Irving BurtonJeff Fisher, pharmacist with [facility] to verify Dr. Sheela StackArfeen's DEA# and Clonazepam order printed for patient on 12/07/14 that was sent to them for delivery.

## 2014-12-23 ENCOUNTER — Telehealth: Payer: Self-pay | Admitting: Internal Medicine

## 2014-12-23 NOTE — Telephone Encounter (Signed)
Pt needs call back to get clarification on the carb intake

## 2014-12-24 ENCOUNTER — Telehealth: Payer: Self-pay | Admitting: Internal Medicine

## 2014-12-24 NOTE — Telephone Encounter (Signed)
Please read message below and advise.  

## 2014-12-24 NOTE — Telephone Encounter (Signed)
Let's try to increase Lantus back to 34 units and may need to add 1+ unit to the mealtime insulin doses, also.

## 2014-12-24 NOTE — Telephone Encounter (Signed)
Called pt and advised her per Dr Gherghe's message. Pt voiced understanding.  

## 2014-12-24 NOTE — Telephone Encounter (Signed)
BS: 12/21 before breakfast 182; 50 before lunch; bedtime 188 12/22 186; 2 hrs after breakfast 149; 187 at bedtime 12/23 after breakfast 240; 197 before lunch  Has been over 200 7 times in the last week.

## 2014-12-24 NOTE — Telephone Encounter (Signed)
Patient ask you to give her a call about her b/s numbers. Before the day is out.

## 2014-12-30 ENCOUNTER — Telehealth (HOSPITAL_COMMUNITY): Payer: Self-pay

## 2014-12-30 ENCOUNTER — Telehealth: Payer: Self-pay | Admitting: *Deleted

## 2014-12-30 NOTE — Telephone Encounter (Signed)
Please confirm exact doses of Lantus and Novolog she is taking

## 2014-12-30 NOTE — Telephone Encounter (Signed)
Telephone message left for patient this nurse received her call that she was having some increased problems with periodic panic attacks as states these are related to increased problems managing health issues such as diabetes.  Informed on message Dr. Lolly MustacheArfeen is off this week and will return on 01/03/14 but that Dr. Ladona Ridgelaylor, helping to cover while Dr. Lolly MustacheArfeen is off suggested her trying cognitive behavior therapy to learn coping techniques that could be effective and helpful during period off increased anxiety or panic attacks as did not think increasing or changing patient's current medication was the best solution.  Informed this was Dr. Lubertha Basqueaylor's recommendation and to call back if any questions or if wanted to discuss with Dr.Arfeen upon his return in the coming week.

## 2014-12-30 NOTE — Telephone Encounter (Signed)
Pt called stating that her blood sugars have been over 200.  Wed 12/28   Tues 12/27   Mon 12/26 168 AM (fasting)  138 AM (fasting)  200 AM (fasting) 202 9:00 am  168 9:00 am  120 9:00 am 294 Before dinner  222 Before lunch  198 Before dinner 249 Before bed  222 Before dinner    96  Before bed  Please review and advise in Dr Charlean SanfilippoGherghe's absence. Thank you.

## 2014-12-30 NOTE — Telephone Encounter (Signed)
Called pt to confirm insulin dosages: Novolog - 8 units with a small meal, 9 units with a regular meal and 10 units with a large meal + sliding scale. Lantus 34 units at bedtime. Please advise.

## 2014-12-30 NOTE — Telephone Encounter (Signed)
Increase on Novolog doses by 3 units Change Lantus to 20 units twice a day

## 2014-12-30 NOTE — Telephone Encounter (Signed)
Called pt and advised her per Dr Remus BlakeKumar's message. Pt voiced she will make the changes.

## 2015-01-06 ENCOUNTER — Ambulatory Visit (HOSPITAL_COMMUNITY): Payer: Self-pay | Admitting: Clinical

## 2015-01-13 ENCOUNTER — Emergency Department (INDEPENDENT_AMBULATORY_CARE_PROVIDER_SITE_OTHER)
Admission: EM | Admit: 2015-01-13 | Discharge: 2015-01-13 | Disposition: A | Discharge status: Home / Self Care | Source: Home / Self Care | Attending: Family Medicine | Admitting: Family Medicine

## 2015-01-13 ENCOUNTER — Encounter (HOSPITAL_COMMUNITY): Payer: Self-pay

## 2015-01-13 DIAGNOSIS — I10 Essential (primary) hypertension: Secondary | ICD-10-CM | POA: Diagnosis not present

## 2015-01-13 MED ORDER — CLONIDINE HCL 0.1 MG PO TABS
0.1000 mg | ORAL_TABLET | Freq: Once | ORAL | Status: AC
  Administered 2015-01-13: 0.1 mg via ORAL

## 2015-01-13 MED ORDER — CLONIDINE HCL 0.1 MG PO TABS
ORAL_TABLET | ORAL | Status: AC
  Filled 2015-01-13: qty 1

## 2015-01-13 NOTE — ED Provider Notes (Signed)
CSN: [id number]     Arrival date & time 01/13/15  1512 History   First MD Initiated Contact with Patient 01/13/15 1640     Chief Complaint  Patient presents with  . Hypertension   (Consider location/radiation/quality/duration/timing/severity/associated sxs/prior Treatment) HPI Blood pressure has been high most of the day. Recently had medications changed by her doctor. Unable to reach PCP today No other symptoms.  Past Medical History  Diagnosis Date  . Diabetes mellitus without complication (HCC)   . Depression   . IBS (irritable bowel syndrome)   . HTN (hypertension)   . Bipolar 1 disorder (HCC)   . Hyperlipidemia   . Breast density 06/25/2012    Right breast density, will get mammogram and [place]  . Duodenitis    Past Surgical History  Procedure Laterality Date  . Cholecystectomy    . Abdominal hysterectomy    . Colonoscopy     Family History  Problem Relation Age of Onset  . Hypertension Mother   . Bipolar disorder Mother   . Heart disease Brother   . Thyroid disease Sister   . Hypertension Sister   . Bipolar disorder Sister   . Colon cancer Neg Hx   . Colon polyps Neg Hx   . Esophageal cancer Neg Hx   . Kidney disease Neg Hx   . Gallbladder disease Neg Hx   . Diabetes Neg Hx   . Bipolar disorder Maternal Aunt   . Depression Father   . Bipolar disorder Daughter    Social History  Substance Use Topics  . Smoking status: Never Smoker   . Smokeless tobacco: Never Used  . Alcohol Use: No   OB History    Gravida Para Term Preterm AB TAB SAB Ectopic Multiple Living   1 1             Review of Systems ROS +'ve  High blood pressure  Denies: HEADACHE, NAUSEA, ABDOMINAL PAIN, CHEST PAIN, CONGESTION, DYSURIA, SHORTNESS OF BREATH  Allergies  Reglan  Home Medications   Prior to Admission medications   Medication Sig Start Date End Date Taking? Authorizing Provider  amLODipine (NORVASC) 5 MG tablet Take 5 mg by mouth daily.    Historical Provider, MD  BD PEN  NEEDLE NANO U/F 32G X 4 MM MISC  08/31/14   Historical Provider, MD  clonazePAM (KLONOPIN) 1 MG tablet Take 1/2 to 1 tab twice a day as needed for anxiety 12/07/14   Cleotis Nipper, MD  FLUoxetine (PROZAC) 40 MG capsule Take 1 capsule (40 mg total) by mouth daily. 12/07/14   Cleotis Nipper, MD  gabapentin (NEURONTIN) 600 MG tablet  07/30/14   Historical Provider, MD  glucose blood (FREESTYLE LITE) test strip Check sugars 4x a day. Dx code: E13.9 11/13/13   Carlus Pavlov, MD  insulin aspart (NOVOLOG FLEXPEN) 100 UNIT/ML FlexPen Inject 5-10 Units into the skin 3 (three) times daily with meals. 05/12/14   Carlus Pavlov, MD  Insulin Glargine (LANTUS SOLOSTAR) 100 UNIT/ML Solostar Pen INJECT 32 UNITS UNDER THE SKIN DAILY AT 10 P.M. 11/23/14   Carlus Pavlov, MD  lamoTRIgine (LAMICTAL) 200 MG tablet Take 1 tablet (200 mg total) by mouth daily. 12/07/14   Cleotis Nipper, MD  Lancets (FREESTYLE) lancets Use to test blood sugar 4 to 6 times daily as instructed. 08/25/14   Carlus Pavlov, MD  losartan (COZAAR) 100 MG tablet Take 100 mg by mouth daily.    Historical Provider, MD  metFORMIN (GLUCOPHAGE) 500 MG tablet  Take 500 mg by mouth 2 (two) times daily with a meal.    Historical Provider, MD  omeprazole (PRILOSEC) 40 MG capsule Take 1 capsule (40 mg total) by mouth 2 (two) times daily. 02/26/14   Hart Carwinora M Brodie, MD  ondansetron (ZOFRAN ODT) 4 MG disintegrating tablet Take 1 tablet (4 mg total) by mouth 3 (three) times daily with meals. 06/24/14   Hart Carwinora M Brodie, MD  promethazine (PHENERGAN) 25 MG tablet Take 25 mg by mouth every 4 (four) hours as needed.  02/08/14   Historical Provider, MD   Meds Ordered and Administered this Visit   Medications  cloNIDine (CATAPRES) tablet 0.1 mg (0.1 mg Oral Given 01/13/15 1747)    BP 181/86 mmHg  Pulse 94  Temp(Src) 98.1 F (36.7 C) (Oral)  Resp 16  SpO2 98% No data found.   Physical Exam NURSES NOTES AND VITAL SIGNS REVIEWED. CONSTITUTIONAL: Well developed,  well nourished, no acute distress HEENT: normocephalic, atraumatic EYES: Conjunctiva normal NECK:normal ROM, supple PULMONARY:No respiratory distress, normal effort, Lungs: CTAb/l CARDIOVASCULAR: RRR, no murmur ABDOMEN: soft, ND, NT, +'ve BS MUSCULOSKELETAL: Normal ROM of all extremities SKIN: warm and dry without rash PSYCHIATRIC: Mood and affect normal  ED Course  Procedures (including critical care time)  Labs Review Labs Reviewed - No data to display  Imaging Review No results found.   Visual Acuity Review  Right Eye Distance:   Left Eye Distance:   Bilateral Distance:    Right Eye Near:   Left Eye Near:    Bilateral Near:         MDM   1. Essential hypertension    Pt is given .1 mg of catapres and bp did improve Unable to reach her pcp tonight Pt should see her doctor tomorrow Reassurance given Discharged home in stable condition.    Tharon AquasFrank C Tarron Krolak, PA 01/13/15 [phone]

## 2015-01-13 NOTE — Discharge Instructions (Signed)
Hypertension Hypertension, commonly called high blood pressure, is when the force of blood pumping through your arteries is too strong. Your arteries are the blood vessels that carry blood from your heart throughout your body. A blood pressure reading consists of a higher number over a lower number, such as 110/72. The higher number (systolic) is the pressure inside your arteries when your heart pumps. The lower number (diastolic) is the pressure inside your arteries when your heart relaxes. Ideally you want your blood pressure below 120/80. Hypertension forces your heart to work harder to pump blood. Your arteries may become narrow or stiff. Having untreated or uncontrolled hypertension can cause heart attack, stroke, kidney disease, and other problems. RISK FACTORS Some risk factors for high blood pressure are controllable. Others are not.  Risk factors you cannot control include:   Race. You may be at higher risk if you are African American.  Age. Risk increases with age.  Gender. Men are at higher risk than women before age 45 years. After age 65, women are at higher risk than men. Risk factors you can control include:  Not getting enough exercise or physical activity.  Being overweight.  Getting too much fat, sugar, calories, or salt in your diet.  Drinking too much alcohol. SIGNS AND SYMPTOMS Hypertension does not usually cause signs or symptoms. Extremely high blood pressure (hypertensive crisis) may cause headache, anxiety, shortness of breath, and nosebleed. DIAGNOSIS To check if you have hypertension, your health care provider will measure your blood pressure while you are seated, with your arm held at the level of your heart. It should be measured at least twice using the same arm. Certain conditions can cause a difference in blood pressure between your right and left arms. A blood pressure reading that is higher than normal on one occasion does not mean that you need treatment. If  it is not clear whether you have high blood pressure, you may be asked to return on a different day to have your blood pressure checked again. Or, you may be asked to monitor your blood pressure at home for 1 or more weeks. TREATMENT Treating high blood pressure includes making lifestyle changes and possibly taking medicine. Living a healthy lifestyle can help lower high blood pressure. You may need to change some of your habits. Lifestyle changes may include:  Following the DASH diet. This diet is high in fruits, vegetables, and whole grains. It is low in salt, red meat, and added sugars.  Keep your sodium intake below 2,300 mg per day.  Getting at least 30-45 minutes of aerobic exercise at least 4 times per week.  Losing weight if necessary.  Not smoking.  Limiting alcoholic beverages.  Learning ways to reduce stress. Your health care provider may prescribe medicine if lifestyle changes are not enough to get your blood pressure under control, and if one of the following is true:  You are 18-59 years of age and your systolic blood pressure is above 140.  You are 60 years of age or older, and your systolic blood pressure is above 150.  Your diastolic blood pressure is above 90.  You have diabetes, and your systolic blood pressure is over 140 or your diastolic blood pressure is over 90.  You have kidney disease and your blood pressure is above 140/90.  You have heart disease and your blood pressure is above 140/90. Your personal target blood pressure may vary depending on your medical conditions, your age, and other factors. HOME CARE INSTRUCTIONS    Have your blood pressure rechecked as directed by your health care provider.   Take medicines only as directed by your health care provider. Follow the directions carefully. Blood pressure medicines must be taken as prescribed. The medicine does not work as well when you skip doses. Skipping doses also puts you at risk for  problems.  Do not smoke.   Monitor your blood pressure at home as directed by your health care provider. SEEK MEDICAL CARE IF:   You think you are having a reaction to medicines taken.  You have recurrent headaches or feel dizzy.  You have swelling in your ankles.  You have trouble with your vision. SEEK IMMEDIATE MEDICAL CARE IF:  You develop a severe headache or confusion.  You have unusual weakness, numbness, or feel faint.  You have severe chest or abdominal pain.  You vomit repeatedly.  You have trouble breathing. MAKE SURE YOU:   Understand these instructions.  Will watch your condition.  Will get help right away if you are not doing well or get worse.   This information is not intended to replace advice given to you by your health care provider. Make sure you discuss any questions you have with your health care provider.   Document Released: 12/18/2004 Document Revised: 05/04/2014 Document Reviewed: 10/10/2012 Elsevier Interactive Patient Education 2016 [employer]  

## 2015-01-13 NOTE — ED Notes (Signed)
Pt stated that she has been having elevated B/P since a medication change 2 weeks ago Pt was recently put on Benicar from losartan  Pt alert and oriented

## 2015-01-19 ENCOUNTER — Telehealth: Payer: Self-pay | Admitting: *Deleted

## 2015-01-19 ENCOUNTER — Other Ambulatory Visit: Payer: Self-pay | Admitting: *Deleted

## 2015-01-19 NOTE — Telephone Encounter (Signed)
Called pt and advised her per Dr Charlean Sanfilippo message, pt voiced understanding and will make changes.

## 2015-01-19 NOTE — Telephone Encounter (Signed)
What we can do is decrease the am Lantus dose by 5 units and add those 5 units to the evening dose.

## 2015-01-19 NOTE — Telephone Encounter (Signed)
Pt is concerned about her morning highs. Please advise if there needs to be a dosage change. Thank you.

## 2015-01-19 NOTE — Telephone Encounter (Signed)
Needs 1 unit less of Novolog with b'fast and dinner. Otherwise, they look great!

## 2015-01-19 NOTE — Telephone Encounter (Signed)
Pt called with blood sugars (her insulin doses changed for Novolog: 11, 12 and 13 units; her Lantus is 20 in the AM and 20 in the PM). Please read and advise.   Wynelle Link    Mon    Tues 1/15    1/16    1/17 166 AM   198 AM   209 AM 100 Before lunch  72 Before lunch  69 Before lunch 118 Before dinner  66 Before dinner  120 Before dinner 83 Bedtime   122 Bedtime  116 Bedtime

## 2015-01-20 ENCOUNTER — Telehealth (HOSPITAL_COMMUNITY): Payer: Self-pay

## 2015-01-20 NOTE — Telephone Encounter (Signed)
Telephone call with patient to follow up with message she left that her anxiety has increased recently as her blood pressure has been up.  Patient reported she is currently at her PCP office to follow up on continued elevated blood pressure and agreed to call our office back if needed or anything we could help her with while managing increased stressors and patient agreed with plan.

## 2015-01-21 ENCOUNTER — Telehealth: Payer: Self-pay | Admitting: *Deleted

## 2015-01-21 ENCOUNTER — Telehealth (HOSPITAL_COMMUNITY): Payer: Self-pay | Admitting: Emergency Medicine

## 2015-01-21 ENCOUNTER — Other Ambulatory Visit: Payer: Self-pay | Admitting: *Deleted

## 2015-01-21 MED ORDER — OMEPRAZOLE 40 MG PO CPDR: 40.0000 mg | DELAYED_RELEASE_CAPSULE | Freq: Two times a day (BID) | ORAL | Status: DC

## 2015-01-21 NOTE — Telephone Encounter (Signed)
Spoke to patient and advised her we got a request by fax from [facility] for the 90 supply for Omeprazole 40 mg.  I advised her she needs to choose another MD due to Dr. Delia Chimes retirement.  She chose Dr. Lavon Paganini and I made her an appointment for 03-18-2015 at 3:45 PM.  I sent the prescription to [facility] .

## 2015-01-21 NOTE — ED Notes (Signed)
Accessed chart; pt called.... no further action taken

## 2015-01-27 ENCOUNTER — Telehealth: Payer: Self-pay | Admitting: Internal Medicine

## 2015-01-27 MED ORDER — METFORMIN HCL 500 MG PO TABS: 500.0000 mg | ORAL_TABLET | Freq: Two times a day (BID) | ORAL | Status: DC

## 2015-01-27 NOTE — Telephone Encounter (Signed)
Refill of metformin sent to [facility]

## 2015-01-27 NOTE — Telephone Encounter (Signed)
Patient stated that she need a new prescription for her metformin.

## 2015-02-02 ENCOUNTER — Telehealth: Payer: Self-pay | Admitting: Internal Medicine

## 2015-02-02 NOTE — Telephone Encounter (Signed)
Pt called in to make you aware [facility] will be sending over an order for her test strips.

## 2015-02-02 NOTE — Telephone Encounter (Signed)
Understood 

## 2015-02-03 ENCOUNTER — Other Ambulatory Visit: Payer: Self-pay

## 2015-02-03 ENCOUNTER — Telehealth: Payer: Self-pay | Admitting: Internal Medicine

## 2015-02-03 MED ORDER — GLUCOSE BLOOD VI STRP: ORAL_STRIP | Status: DC

## 2015-02-03 NOTE — Telephone Encounter (Signed)
Pt needs [facility] for test strips please

## 2015-02-03 NOTE — Telephone Encounter (Signed)
Rx submitted

## 2015-02-07 ENCOUNTER — Telehealth (HOSPITAL_COMMUNITY): Payer: Self-pay

## 2015-02-07 NOTE — Telephone Encounter (Signed)
Patient is calling because she used [facility] and they want another 90 day supply written for continuity of care. She has a f/u with you on 03/07/2016, patient states that if she waits until that appointment for her medication refills the she will end up going a few days without because they take 7-10 days for delivery. Will it be okay to send this in? Please advise, thank you

## 2015-02-08 ENCOUNTER — Telehealth: Payer: Self-pay | Admitting: Internal Medicine

## 2015-02-08 NOTE — Telephone Encounter (Signed)
Patient called stating that she is concerned about her morning blood sugars   Blood sugar: AM: 170-201 PM: They are fine   [name] would like to advise Dr. Elvera Lennox   Please advise   Thank You

## 2015-02-08 NOTE — Telephone Encounter (Signed)
Called pt and advised her per Dr Charlean Sanfilippo message. Pt voiced understanding. Will let us know how her sugar readings are.

## 2015-02-08 NOTE — Telephone Encounter (Signed)
Let's increase Lantus by 3 units

## 2015-02-08 NOTE — Telephone Encounter (Signed)
Patient stated that she didn't quite understand the message, please call me back.

## 2015-02-08 NOTE — Telephone Encounter (Signed)
Returned pt's call and advised her to add 3 units to her Lantus at night. Call in a few days and let us know what her sugars are . Pt voiced understanding.

## 2015-02-08 NOTE — Telephone Encounter (Signed)
Please read message below and advise.  

## 2015-02-09 ENCOUNTER — Other Ambulatory Visit (HOSPITAL_COMMUNITY): Payer: Self-pay | Admitting: Psychiatry

## 2015-02-14 ENCOUNTER — Ambulatory Visit (HOSPITAL_COMMUNITY): Payer: Self-pay | Admitting: Clinical

## 2015-02-14 NOTE — Telephone Encounter (Signed)
I s/w Dr. Lolly Mustache regarding refill and he stated that patient would have to wait for appointment, I called patient and left a voicemail letting her know

## 2015-02-23 ENCOUNTER — Other Ambulatory Visit: Payer: Self-pay | Admitting: *Deleted

## 2015-02-23 ENCOUNTER — Ambulatory Visit (INDEPENDENT_AMBULATORY_CARE_PROVIDER_SITE_OTHER): Admitting: Internal Medicine

## 2015-02-23 ENCOUNTER — Encounter: Payer: Self-pay | Admitting: Internal Medicine

## 2015-02-23 VITALS — BP 132/80 | HR 94 | Temp 97.5°F | Resp 12 | Wt 172.0 lb

## 2015-02-23 DIAGNOSIS — E139 Other specified diabetes mellitus without complications: Secondary | ICD-10-CM

## 2015-02-23 MED ORDER — INSULIN ASPART 100 UNIT/ML FLEXPEN: 10.0000 [IU] | PEN_INJECTOR | Freq: Three times a day (TID) | SUBCUTANEOUS | Status: DC

## 2015-02-23 MED ORDER — INSULIN GLARGINE 100 UNIT/ML SOLOSTAR PEN: PEN_INJECTOR | SUBCUTANEOUS | Status: DC

## 2015-02-23 NOTE — Patient Instructions (Signed)
Please continue: - Metformin 500 mg 2x a day  - NovoLog before a meal as follows:  - 11 units before a small meal - 12 units before a regular meal  (- 13 units before a large meal) - Sliding scale of NovoLog: - 150-200: + 1 unit  - 201-250: + 2 units  - 251-300: + 3 units  - >300: + 4 units   Please change Lantus: 10 units in am and 30 units at bedtime.  Please come back for a follow-up appointment in 3 months.  Please stop at the lab.

## 2015-02-23 NOTE — Progress Notes (Signed)
Patient ID: [name], female   DOB: [birth year], 60 y.o.   MRN: [id number]  HPI: [name] is a 60 y.o.-year-old female, initially referred by her PCP, Dr. Assunta Found (PA: Lenise Herald), for management of LADA, dx 2005, insulin-dependent since ~2006, controlled, with complications (PN, gastroparesis?). She returns for f/u for her DM2. Last visit 3 mo ago.  She still has problems with IBS. On Amitiza. She has an abdominal hernia >> has an appt with GI in 04/2015.  Previous fructosamine with corresponding HbA1c levels: Office Visit on 11/23/2014  Component Date Value Ref Range HbA1c - calculated  . Fructosamine 11/23/2014 332* 190 - 270 umol/L 7.25%   . Fructosamine 09/01/2014 328* 190 - 270 umol/L 7.18%   . Fructosamine 04/20/2014 388* 190 - 270 umol/L 8.2%.   . Fructosamine 10/29/2013 418* 190 - 270 umol/L 8.7%    Lab Results  Component Value Date   HGBA1C 4.6 07/16/2013   HGBA1C sent to [facility] 07/16/2013   HGBA1C 4.8 04/15/2012  - 02/12/2013: 4.2% - 12/19/2012: 5.2% - 09/18/2012: 6.5%  She is now on: - Lantus 32 >> 15 units in am and 25 units at bedtime. - Metformin 500 mg 2x a day  - NovoLog before a meal as follows:  - 7 >> 11 units before a small meal - 8 >> 12 units before a regular meal  - 9 >> 13 units before a large meal  - Sliding scale of NovoLog: - 150-200: + 1 unit  - 201-250: + 2 units  - 251-300: + 3 units  - >300: + 4 units   Pt checks her sugars 4-6 a day: - am: 157-231, 265 >> 100-222 >> 64-140, 180, 228 >> 57x1, 112-182, 208, 325 >>  51x1, 130-168, 208 - 2h after b'fast: 193-259, 299 >> 89-131 >> 124-239 >> 159-253 >> 63-109, 268 >> 101-208 >> 124, 194, 236 - before lunch: 107-150, 190, 253 >> 55, 69-163, 190, 270, 273 >> 44, 52-156 >> 50, 58-169 - 2h after lunch: 98-145, 258 >> 74-110, 198 >> 156, 159 >> 140-218 >> 68-155 >> 48, 297 >> 92, 117 - before dinner: 72, 130-290 >> 40, 45, 100-209, 273 >> 98-173, 200 >> 83-169, 221, 274 - 2h  after dinner: 176-296 >> 117 >> n/c >> 142 >> 101, 149 >> 118, 208, 271 >> 160 >> 146 >> 178  - bedtime: 105-321 (eats a pack of nabs after dinner) >> 83-203 >> 58, 81-200, 210, 251 >> 68, 99-231 - at night: 135-193 >> 37 x1, 110 >> n/c >> 108, 228 >> n/c >> 41 x1, 75-202 >> 68-143, 172, 206, 298 Has lows. Lowest sugar was 53 >> 40 (did not eat) >> 41,44  >> 51; she has hypoglycemia awareness at 50. Highest sugar was 311 >> 240s >> 325 >> 273 >> 347  Pt's meals are: - Breakfast: toast, bowl of cereal + 2% milk  - Lunch: soup, sometimes sandwich, vegetables - Dinner: chicken/pork/beef + vegetable, no starch - Snacks: sugar free sweets Had DM education in 2012. Her insurance does not pay for nutrition or DM edu classes.  - no CKD, last BUN/creatinine:  Previously: Lab Results  Component Value Date   BUN 16 02/24/2014   CREATININE 0.71 02/24/2014  On Losartan. - last set of lipids:  Lab Results  Component Value Date   CHOL 161 09/01/2014   HDL 45.40 09/01/2014   LDLCALC 98 09/01/2014   TRIG 85.0 09/01/2014   CHOLHDL 4 09/01/2014  161/92/46/97  in 03/18/2013.  Not on a statin.  - last eye exam was on 12/2014 - [facility]. No DR. + small cataract. - + numbness and tingling in her feet. On Neurontin.  Foot exam checked by PCP: 10/2012. She is seeing her podiatrist [facility]).  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 She recently stopped [place]. Also: GERD, HTN, HL, Fibromyalgia >> on Gabapentin.  I reviewed pt's medications, allergies, PMH, social hx, family hx, and changes were documented in the history of present illness. Otherwise, unchanged from my initial visit note.   ROS: Constitutional: + weight gain, + fatigue, no hot flushes Eyes: no blurry vision, no xerophthalmia ENT: no sore throat, no nodules palpated in throat, no dysphagia/odynophagia, no hoarseness Cardiovascular: no CP/SOB/palpitations/no  leg swelling Respiratory: no cough/SOB Gastrointestinal: + N/no V/D/+ C/heartburn Musculoskeletal: + muscle/no joint aches Skin: no rashes,+ hair loss Neurological: + tremors/no numbness/tingling/dizziness, + HA  PE: BP 132/80 mmHg  Pulse 94  Temp(Src) 97.5 F (36.4 C) (Oral)  Resp 12  Wt 172 lb (78.019 kg)  SpO2 96% Body mass index is 29.51 kg/(m^2).  Wt Readings from Last 3 Encounters:  02/23/15 172 lb (78.019 kg)  12/07/14 169 lb (76.658 kg)  11/23/14 169 lb (76.658 kg)   Constitutional: overweight, in distress b/c feeling nauseated Eyes: PERRLA, EOMI, no exophthalmos ENT: moist mucous membranes, no thyromegaly, no cervical lymphadenopathy Cardiovascular: RRR, No MRG Respiratory: CTA B Gastrointestinal: abdomen soft, NT, ND, BS+ Musculoskeletal: no deformities, strength intact in all 4 Skin: moist, warm, no rashes Neurological: no tremor with outstretched hands, DTR normal in all 4  ASSESSMENT: 1. LADA, insulin-dependent, uncontrolled, with complications - peripheral neuropathy - gastroparesis?  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 She still has a positive C peptide >> still has insulin secretion. For this reason, we continued the Metformin for now. Welchol was started by PCP for cholesterol lowering, so we continued this, also.  Labs from 11/17/2013: - ACR 29.4 - CBC with differential normal, except eosinophiles 6% (0-5) - CMP normal, with a BUN/creatinine ratio of 13/0.55, random glucose 73, GFR >89 - Lipids: 161/92/46/97 - TSH 1.918 - Urinalysis normal, without ketones or glucose  Insurance does not cover nutrition visits...  PLAN:  1. Patient with LADA, on basal-bolus + oral  antidiabetic regimen, with still very fluctuating sugars. Sugars ~same as before, but no more 40s. As sugars in am are higher >> will change Lantus to 30 units at bedtime and 10 units in am -  I suggested to:  Patient Instructions  Please continue: - Metformin 500 mg 2x a day  - NovoLog before a meal as follows:  - 11 units before a small meal - 12 units before a regular meal  (- 13 units before a large meal) - Sliding scale of NovoLog: - 150-200: + 1 unit  - 201-250: + 2 units  - 251-300: + 3 units  - >300: + 4 units   Please change Lantus: 10 units in am and 30 units at  bedtime.  Please come back for a follow-up appointment in 3 months.  Please stop at the lab.  - continue checking sugars at different times of the day - check 3-4 times a day, rotating checks - UTD with eye exams - will check a fructosamine today - Return to clinic in 3 mo with sugar log   Office Visit on 02/23/2015  Component Date Value Ref Range Status  . HM Diabetic Eye Exam 12/15/2014 No Retinopathy  No Retinopathy Final  . Fructosamine 02/23/2015 334* 190 - 270 umol/L Final   HbA1c calculated from fructosamine is stable, at 7.28%

## 2015-02-25 LAB — FRUCTOSAMINE: FRUCTOSAMINE: 334 umol/L — AB (ref 190–270)

## 2015-03-01 ENCOUNTER — Encounter: Payer: Self-pay | Admitting: Internal Medicine

## 2015-03-01 NOTE — Progress Notes (Signed)
Received labs from PCP, from 08/31/2014: - CMP normal, except glucose 194, albumin 5.1 (3.6-4.8). BUN/creatinine 13/0.63, EGFR 97.

## 2015-03-03 ENCOUNTER — Telehealth: Payer: Self-pay | Admitting: Internal Medicine

## 2015-03-03 NOTE — Telephone Encounter (Signed)
Patient called regarding her A1C   She would like to know if she can consume diet pepsi or a little coffee during the day?   Please advise    Thank you

## 2015-03-03 NOTE — Telephone Encounter (Signed)
Returned pt's call and advised her sugar free soda, prefer water though and coffee is ok with no sweetner and little creamer.

## 2015-03-07 ENCOUNTER — Ambulatory Visit (INDEPENDENT_AMBULATORY_CARE_PROVIDER_SITE_OTHER): Admitting: Gastroenterology

## 2015-03-07 ENCOUNTER — Encounter: Payer: Self-pay | Admitting: Gastroenterology

## 2015-03-07 VITALS — BP 140/80 | HR 64 | Ht 64.0 in | Wt 170.6 lb

## 2015-03-07 DIAGNOSIS — K5902 Outlet dysfunction constipation: Secondary | ICD-10-CM

## 2015-03-07 DIAGNOSIS — R198 Other specified symptoms and signs involving the digestive system and abdomen: Secondary | ICD-10-CM | POA: Diagnosis not present

## 2015-03-07 DIAGNOSIS — K5900 Constipation, unspecified: Secondary | ICD-10-CM

## 2015-03-07 MED ORDER — OMEPRAZOLE 40 MG PO CPDR: 40.0000 mg | DELAYED_RELEASE_CAPSULE | Freq: Two times a day (BID) | ORAL | Status: DC

## 2015-03-07 MED ORDER — AMBULATORY NON FORMULARY MEDICATION: Status: DC

## 2015-03-07 MED ORDER — LUBIPROSTONE 24 MCG PO CAPS: 24.0000 ug | ORAL_CAPSULE | Freq: Every day | ORAL | Status: DC

## 2015-03-07 NOTE — Patient Instructions (Signed)
We will contact you with your appointment for physical therapy  We will refill your omeprazole Follow up in 3 months

## 2015-03-07 NOTE — Progress Notes (Signed)
[name]    [id number]    04-22-1955  Primary Care Physician:GOLDING, Chancy Hurter, MD  Referring Physician: Assunta Found, MD [address] [place], [place] [address]  Chief complaint: IBS constipation  HPI: 60 year old female with history of fibromyalgia, diabetes and irritable bowel syndrome previously followed by Dr. Juanda Chance is here to establish care with me. She underwent an Upper endoscopy on 02/26/2014 showed erosive duodenitis consistent with peptic duodenitis. H. pylori was negative. There was no Barrett's esophagus. She has been on omeprazole 40 mg twice a day, denies any heartburn or difficulty swallowing. No longer has vomiting but has intermittent nausea. She continues to have constipation and difficulty evacuating her bowel. She is currently on Amitiza 8  micrograms and takes Dulcolax as needed with irregular bowel movements. She also complained of inability to push to have a bowel movement and her belly bulges out and has pain.    Outpatient Encounter Prescriptions as of 03/07/2015  Medication Sig  . AMITIZA 8 MCG capsule   . amLODipine (NORVASC) 5 MG tablet Take 5 mg by mouth daily.  . BD PEN NEEDLE NANO U/F 32G X 4 MM MISC   . BENICAR 40 MG tablet   . clonazePAM (KLONOPIN) 1 MG tablet Take 1/2 to 1 tab twice a day as needed for anxiety  . FLUoxetine (PROZAC) 40 MG capsule Take 1 capsule (40 mg total) by mouth daily.  Marland Kitchen gabapentin (NEURONTIN) 800 MG tablet Take 800 mg by mouth. 1 tablet in the morning and 2 tablets at bedtime.  Marland Kitchen glucose blood (FREESTYLE LITE) test strip Check sugars 4x a day. Dx code: E13.9  . insulin aspart (NOVOLOG FLEXPEN) 100 UNIT/ML FlexPen Inject 10-15 Units into the skin 3 (three) times daily with meals.  . Insulin Glargine (LANTUS SOLOSTAR) 100 UNIT/ML Solostar Pen Inject 10 units under skin in am and 30 units at bedtime.  . lamoTRIgine (LAMICTAL) 200 MG tablet Take 1 tablet (200 mg total) by mouth daily.  . Lancets (FREESTYLE)  lancets Use to test blood sugar 4 to 6 times daily as instructed.  . metFORMIN (GLUCOPHAGE) 500 MG tablet Take 1 tablet (500 mg total) by mouth 2 (two) times daily with a meal.  . omeprazole (PRILOSEC) 40 MG capsule Take 1 capsule (40 mg total) by mouth 2 (two) times daily.  . ondansetron (ZOFRAN ODT) 4 MG disintegrating tablet Take 1 tablet (4 mg total) by mouth 3 (three) times daily with meals.  . [DISCONTINUED] promethazine (PHENERGAN) 25 MG tablet Take 25 mg by mouth every 4 (four) hours as needed. Reported on 03/07/2015   No facility-administered encounter medications on file as of 03/07/2015.    Allergies as of 03/07/2015 - Review Complete 03/07/2015  Allergen Reaction Noted  . Reglan [metoclopramide] Other (See Comments) 03/23/2013    Past Medical History  Diagnosis Date  . Diabetes mellitus without complication (HCC)   . Depression   . IBS (irritable bowel syndrome)   . HTN (hypertension)   . Bipolar 1 disorder (HCC)   . Hyperlipidemia   . Breast density 06/25/2012    Right breast density, will get mammogram and [place]  . Duodenitis     Past Surgical History  Procedure Laterality Date  . Cholecystectomy    . Abdominal hysterectomy    . Colonoscopy      Family History  Problem Relation Age of Onset  . Hypertension Mother   . Bipolar disorder Mother   . Heart  disease Brother   . Thyroid disease Sister   . Hypertension Sister   . Bipolar disorder Sister   . Colon cancer Neg Hx   . Colon polyps Neg Hx   . Esophageal cancer Neg Hx   . Kidney disease Neg Hx   . Gallbladder disease Neg Hx   . Diabetes Neg Hx   . Bipolar disorder Maternal Aunt   . Depression Father   . Bipolar disorder Daughter     Social History   Social History  . Marital Status: Married    Spouse Name: N/A  . Number of Children: 1  . Years of Education: N/A   Occupational History  . Disabled    Social History Main Topics  . Smoking status: Never Smoker   . Smokeless tobacco: Never Used  .  Alcohol Use: No  . Drug Use: No  . Sexual Activity: Not Currently   Other Topics Concern  . Not on file   Social History Narrative      Review of systems: Review of Systems  Constitutional: Negative for fever and chills.  HENT: Negative.   Eyes: Negative for blurred vision.  Respiratory: Negative for cough, shortness of breath and wheezing.   Cardiovascular: Negative for chest pain and palpitations.  Gastrointestinal: as per HPI Genitourinary: Negative for dysuria, urgency, frequency and hematuria.  Musculoskeletal: Negative for myalgias, back pain and joint pain.  Skin: Negative for itching and rash.  Neurological: Negative for dizziness, tremors, focal weakness, seizures and loss of consciousness.  Endo/Heme/Allergies: Negative for environmental allergies.  Psychiatric/Behavioral: Negative for depression, suicidal ideas and hallucinations.  All other systems reviewed and are negative.   Physical Exam: Filed Vitals:   03/07/15 1554  BP: 140/80  Pulse: 64   Gen:      No acute distress HEENT:  EOMI, sclera anicteric Neck:     No masses; no thyromegaly Lungs:    Clear to auscultation bilaterally; normal respiratory effort CV:         Regular rate and rhythm; no murmurs Abd:      + bowel sounds; soft, non-tender; no palpable masses, no distension Ext:    No edema; adequate peripheral perfusion Skin:      Warm and dry; no rash Neuro: alert and oriented x 3 Psych: normal mood and affect  Data Reviewed: Reviewed her chart in epic   Assessment and Plan/Recommendations: 60 year old female with history of fibromyalgia and irritable bowel syndrome predominant constipation here for follow-up visit We'll increase Amitiza to 24 g daily  advised patient to use squatty potty to increase intra-abdominal pressure during bowel movement We'll also refer to physical therapy to strengthen her abdominal muscle and also to help her with techniques to evacuate better Continue PPI  twice daily and antireflux measures Return in 3 months  K. Scherry RanVeena Phil Michels , MD [phone] Mon-Fri 8a-5p [phone] after 5p, weekends, holidays

## 2015-03-08 ENCOUNTER — Encounter (HOSPITAL_COMMUNITY): Payer: Self-pay | Admitting: Psychiatry

## 2015-03-08 ENCOUNTER — Telehealth (HOSPITAL_COMMUNITY): Payer: Self-pay

## 2015-03-08 ENCOUNTER — Ambulatory Visit (INDEPENDENT_AMBULATORY_CARE_PROVIDER_SITE_OTHER): Admitting: Psychiatry

## 2015-03-08 VITALS — BP 136/87 | HR 82 | Ht 64.0 in | Wt 172.4 lb

## 2015-03-08 DIAGNOSIS — F3131 Bipolar disorder, current episode depressed, mild: Secondary | ICD-10-CM

## 2015-03-08 MED ORDER — CLONAZEPAM 1 MG PO TABS: ORAL_TABLET | ORAL | Status: DC

## 2015-03-08 MED ORDER — LAMOTRIGINE 200 MG PO TABS: 200.0000 mg | ORAL_TABLET | Freq: Every day | ORAL | Status: DC

## 2015-03-08 MED ORDER — FLUOXETINE HCL 40 MG PO CAPS: 40.0000 mg | ORAL_CAPSULE | Freq: Every day | ORAL | Status: DC

## 2015-03-08 NOTE — Progress Notes (Signed)
[facility] [id number] Progress Note  [name] [id number] y.o.  03/08/2015 2:43 PM  Chief Complaint:  Medication management and follow-up.       History of Present Illness:  [name] came for her follow-up appointment.  She is taking her medication and reported no side effects.  She has constipation and recently seen her GI who recommended physical therapy and also suggested to take medication for constipation.  Overall she described her mood is good.  She denies any irritability, anger, mood swing.  She has been unable to see [name] due to insurance reason .  She lives with her husband.  She denies any major panic attack.  She denies any rash or itching with Lamictal.  Her sleep is good.  Her energy level is okay.  She had a good Christmas.  She denies any paranoia or any hallucination.  Her appetite is okay.  Her vitals are stable.  She is not drinking or using any illegal substances.  Suicidal Ideation: No Plan Formed: No Patient has means to carry out plan: No  Homicidal Ideation: No Plan Formed: No Patient has means to carry out plan: No  Past Psychiatric History/Hospitalization(s): Patient  started seeing psychiatrist  Dr. Elna Breslow at age 51 because of poor impulse control and irritability.  She then seen Dr. Tomasa Rand for past 15 years.  In the past she had tried lithium but she developed 2 times lithium toxicity and she is very afraid of lithium.  She had tried Seroquel, Abilify, Depakote, Lexapro, Zoloft, Paxil, Ativan and Risperdal. Recently we tried trazodone but she did not like side effects. Patient endorse history of mania and severe mood swings.  She has one psychiatric hospitalization many years ago due to manic episode.  She denies any history of suicidal attempt. Anxiety: Yes Bipolar Disorder: Yes Depression: Yes Mania: Yes Psychosis: No Schizophrenia: No Personality Disorder: No Hospitalization for psychiatric illness: Yes History of Electroconvulsive  Shock Therapy: No Prior Suicide Attempts: No  Medical History; Patient has diabetes mellitus with neuropathy, IBS, hypertension, hyperlipidemia. Her primary care physician is Sharlet Salina may at [place].  Patient denies any history of traumatic brain injury, seizures or headaches.  Family History; Patient endorse her mother has mania.  Her aunt has psychiatric illness however she does not know if to take any medication.  Review of Systems  Musculoskeletal: Positive for joint pain.  Skin: Negative for itching and rash.  Neurological:       Nephropathy  Psychiatric/Behavioral:       Forgetful and memory impairment    Psychiatric: Agitation: No Hallucination: No Depressed Mood: No Insomnia: No Hypersomnia: No Altered Concentration: No Feels Worthless: No Grandiose Ideas: No Belief In Special Powers: No New/Increased Substance Abuse: No Compulsions: No  Neurologic: Headache: No Seizure: No Paresthesias: Patient has neuropathy pain   Outpatient Encounter Prescriptions as of 03/08/2015  Medication Sig  . AMBULATORY NON FORMULARY MEDICATION Squatty Potty x 1  . amLODipine (NORVASC) 5 MG tablet Take 5 mg by mouth daily.  . BD PEN NEEDLE NANO U/F 32G X 4 MM MISC   . BENICAR 40 MG tablet   . FLUoxetine (PROZAC) 40 MG capsule Take 1 capsule (40 mg total) by mouth daily.  Marland Kitchen gabapentin (NEURONTIN) 800 MG tablet Take 800 mg by mouth. 1 tablet in the morning and 2 tablets at bedtime.  Marland Kitchen glucose blood (FREESTYLE LITE) test strip Check sugars 4x a day. Dx code: E13.9  . insulin aspart (NOVOLOG FLEXPEN) 100 UNIT/ML FlexPen  Inject 10-15 Units into the skin 3 (three) times daily with meals.  . Insulin Glargine (LANTUS SOLOSTAR) 100 UNIT/ML Solostar Pen Inject 10 units under skin in am and 30 units at bedtime.  . lamoTRIgine (LAMICTAL) 200 MG tablet Take 1 tablet (200 mg total) by mouth daily.  . Lancets (FREESTYLE) lancets Use to test blood sugar 4 to 6 times daily as instructed.  .  lubiprostone (AMITIZA) 24 MCG capsule Take 1 capsule (24 mcg total) by mouth daily with breakfast.  . metFORMIN (GLUCOPHAGE) 500 MG tablet Take 1 tablet (500 mg total) by mouth 2 (two) times daily with a meal.  . omeprazole (PRILOSEC) 40 MG capsule Take 1 capsule (40 mg total) by mouth 2 (two) times daily.  . ondansetron (ZOFRAN ODT) 4 MG disintegrating tablet Take 1 tablet (4 mg total) by mouth 3 (three) times daily with meals.  . [DISCONTINUED] clonazePAM (KLONOPIN) 1 MG tablet Take 1/2 to 1 tab twice a day as needed for anxiety  . [DISCONTINUED] clonazePAM (KLONOPIN) 1 MG tablet Take 1/2 to 1 tab twice a day as needed for anxiety  . [DISCONTINUED] FLUoxetine (PROZAC) 40 MG capsule Take 1 capsule (40 mg total) by mouth daily.  . [DISCONTINUED] lamoTRIgine (LAMICTAL) 200 MG tablet Take 1 tablet (200 mg total) by mouth daily.   No facility-administered encounter medications on file as of 03/08/2015.    Recent Results (from the past 2160 hour(s))  HM DIABETES EYE EXAM     Status: None   Collection Time: 12/15/14 12:00 AM  Result Value Ref Range   HM Diabetic Eye Exam No Retinopathy No Retinopathy  Fructosamine     Status: Abnormal   Collection Time: 02/23/15 11:51 AM  Result Value Ref Range   Fructosamine 334 (H) 190 - 270 umol/L      Constitutional:  BP 136/87 mmHg  Pulse 82  Ht 5\' 4"  (1.626 m)  Wt 172 lb 6.4 oz (78.2 kg)  BMI 29.58 kg/m2   Musculoskeletal: Strength & Muscle Tone: within normal limits Gait & Station: normal Patient leans: N/A  Psychiatric Specialty Exam: General Appearance: Casual  Eye Contact::  Fair  Speech:  Normal Rate  Volume:  Normal  Mood:  Anxious  Affect:  Labile  Thought Process:  Logical  Orientation:  Full (Time, Place, and Person)  Thought Content:  WDL  Suicidal Thoughts:  No  Homicidal Thoughts:  No  Memory:  Immediate;   Fair Recent;   Fair Remote;   Poor  Judgement:  Intact  Insight:  Fair  Psychomotor Activity:  Normal   Concentration:  Fair  Recall:  FiservFair  Fund of Knowledge:  Fair  Language:  Fair  Akathisia:  No  Handed:  Right  AIMS (if indicated):     Assets:  Communication Skills Desire for Improvement Financial Resources/Insurance Housing Social Support  ADL's:  Intact  Cognition:  WNL  Sleep:        Established Problem, Stable/Improving (1), Review of Psycho-Social Stressors (1), Review of Last Therapy Session (1) and Review of Medication Regimen & Side Effects (2)  Assessment: Axis I: Bipolar disorder mildly depressed, anxiety disorder NOS  Axis II: Deferred  Axis III:  Past Medical History  Diagnosis Date  . Diabetes mellitus without complication (HCC)   . Depression   . IBS (irritable bowel syndrome)   . HTN (hypertension)   . Bipolar 1 disorder (HCC)   . Hyperlipidemia   . Breast density 06/25/2012    Right breast density, will  get mammogram and [place]  . Duodenitis     Plan:  Patient doing better on her current psychiatric medication.  she is taking Lamictal 200 mg daily, Prozac 40 mg daily and Klonopin 1 mg twice a day.  Discussed medication side effects and benefits.  She's also taking gabapentin 800 mg 3 times a day prescribed by primary care physician.  Discussed benzodiazepine dependence tolerance and withdrawal .  Follow-up in 3 months.  Recommended to call us back if she has any question or any concern.  Aaronjames Kelsay T., MD 03/08/2015

## 2015-03-08 NOTE — Telephone Encounter (Signed)
Dr. Lolly MustacheArfeen asked me to call in the patients Klonopin to [facility], he had printed it out. I voided the one he printed and called [facility], they would not let me call it in, they want a fax or mail. I reprinted the rx and faxed it in to [facility]

## 2015-03-09 ENCOUNTER — Telehealth: Payer: Self-pay | Admitting: *Deleted

## 2015-03-09 MED ORDER — LUBIPROSTONE 24 MCG PO CAPS: 24.0000 ug | ORAL_CAPSULE | Freq: Every day | ORAL | Status: DC

## 2015-03-09 NOTE — Telephone Encounter (Signed)
Sent in 90 day Amitiza to mail order pharmacy per [facility] request from patient

## 2015-03-18 ENCOUNTER — Ambulatory Visit: Payer: Self-pay | Admitting: Gastroenterology

## 2015-03-22 ENCOUNTER — Telehealth: Payer: Self-pay | Admitting: Internal Medicine

## 2015-03-22 NOTE — Telephone Encounter (Signed)
Pt wanted to let you know that in the last 10 days, her sugar level has only gone over 200 two different times.  She said she has been walking regularly also.

## 2015-03-22 NOTE — Telephone Encounter (Signed)
Please read message below and be advised.  

## 2015-03-24 ENCOUNTER — Encounter: Payer: Self-pay | Admitting: Physical Therapy

## 2015-03-24 ENCOUNTER — Ambulatory Visit: Attending: Gastroenterology | Admitting: Physical Therapy

## 2015-03-24 DIAGNOSIS — R29898 Other symptoms and signs involving the musculoskeletal system: Secondary | ICD-10-CM

## 2015-03-24 DIAGNOSIS — M6289 Other specified disorders of muscle: Secondary | ICD-10-CM | POA: Diagnosis present

## 2015-03-24 DIAGNOSIS — N8184 Pelvic muscle wasting: Secondary | ICD-10-CM | POA: Diagnosis present

## 2015-03-24 DIAGNOSIS — R198 Other specified symptoms and signs involving the digestive system and abdomen: Secondary | ICD-10-CM | POA: Diagnosis present

## 2015-03-24 DIAGNOSIS — N8189 Other female genital prolapse: Secondary | ICD-10-CM | POA: Diagnosis present

## 2015-03-24 NOTE — Therapy (Signed)
[facility] [address], [address] [place], [place], [address] Phone: [phone]   Fax:  [phone]  Physical Therapy Evaluation  Patient Details  Name: [name] MRN: [id number] Date of Birth: 1955/07/31 Referring Provider: Dr. Marsa Aris  Encounter Date: 03/24/2015      PT End of Session - 03/24/15 1526    Visit Number 1   Date for PT Re-Evaluation 05/19/15   Authorization Type tricare/coinsurance 24 limit visit within 60 days   PT Start Time 1445   PT Stop Time 1527   PT Time Calculation (min) 42 min   Activity Tolerance Patient tolerated treatment well;Treatment limited secondary to medical complications (Comment)      Past Medical History  Diagnosis Date  . Diabetes mellitus without complication (HCC)   . Depression   . IBS (irritable bowel syndrome)   . HTN (hypertension)   . Bipolar 1 disorder (HCC)   . Hyperlipidemia   . Breast density 06/25/2012    Right breast density, will get mammogram and [place]  . Duodenitis     Past Surgical History  Procedure Laterality Date  . Cholecystectomy    . Abdominal hysterectomy    . Colonoscopy      There were no vitals filed for this visit.  Visit Diagnosis:  Pelvic floor dysfunction - Plan: PT plan of care cert/re-cert  Pelvic floor weakness - Plan: PT plan of care cert/re-cert  Abdominal weakness - Plan: PT plan of care cert/re-cert  Weakness of both hips - Plan: PT plan of care cert/re-cert      Subjective Assessment - 03/24/15 1452    Subjective Patient reports constipation got worse 3 months ago due to having to strain.  Patient has a Training and development officer.  Patient gets bloated.  3 omnths agos had 3 bowel movements per week.  Now has 1-2 bowel movements per week.  Pateint reports sometimes she has to manually evacuate the bowels.    Patient Stated Goals improve consistent bowel movements.    Currently in Pain? Yes   Pain Score 1    Pain Location Abdomen    Pain Orientation Mid   Pain Descriptors / Indicators Tender   Pain Type Chronic pain   Pain Frequency Intermittent   Aggravating Factors  when feels full   Pain Relieving Factors bowel movement   Multiple Pain Sites No            [facility] PT Assessment - 03/24/15 0001    Assessment   Medical Diagnosis R19.8 Abdominal weakness; K59.00 Constipation inspecified constipation type; K59.02 Constipation, outlet dysfunction   Referring Provider Dr. Marsa Aris   Onset Date/Surgical Date 01/02/15   Prior Therapy None   Precautions   Precautions None   Balance Screen   Has the patient fallen in the past 6 months No   Has the patient had a decrease in activity level because of a fear of falling?  No   Is the patient reluctant to leave their home because of a fear of falling?  No   Prior Function   Level of Independence Independent   Vocation On disability   Leisure walking 1 mile per day    Cognition   Overall Cognitive Status Within Functional Limits for tasks assessed   Observation/Other Assessments   Focus on Therapeutic Outcomes (FOTO)  59% limitation CK  goal is 40% limitation   Posture/Postural Control   Posture/Postural Control Postural limitations   Postural Limitations Rounded Shoulders;Forward head  ROM / Strength   AROM / PROM / Strength AROM;Strength   AROM   Overall AROM Comments lumbar ROM is full   Strength   Overall Strength Comments bil. hip strength is 4/5 and abdominal strength is 2/5   Palpation   Palpation comment tenderness located in mide abdoment with firnmess and on right side of abdomen                 Pelvic Floor Special Questions - 03/24/15 0001    Pelvic Floor Internal Exam Patient confirms identification and approves PT to assess muscle strength and integrity   Exam Type Rectal   Palpation feel the external sphincter to contract but unable to contract the internal sphincter and puborectalis; Patient will contract the pelvic floor  when trying to push the therapist finger out of the anal canal   Strength weak squeeze, no lift   Tone hypotonicity of rectal area          [facility] Adult PT Treatment/Exercise - 03/24/15 0001    Self-Care   Self-Care Other Self-Care Comments   Other Self-Care Comments  toileting technique; abdominal massage                PT Education - 03/24/15 1522    Education provided Yes   Education Details abdominal massage, toileting technique   Person(s) Educated Patient   Methods Explanation;Demonstration;Verbal cues;Handout   Comprehension Returned demonstration;Verbalized understanding          PT Short Term Goals - 03/24/15 1534    PT SHORT TERM GOAL #1   Title understand correct toileting technique to have a bowel movement   Time 4   Period Weeks   Status New   PT SHORT TERM GOAL #2   Title independent with initial flexibility exercises   Time 4   Period Weeks   Status New           PT Long Term Goals - 03/24/15 1707    PT LONG TERM GOAL #1   Title independent with HEP   Time 8   Period Weeks   Status New   PT LONG TERM GOAL #2   Title ability to have a bowel movement without straining due to relaxation of pelvic floor   Time 8   Period Weeks   Status New   PT LONG TERM GOAL #3   Title ability to contract the internal anal sphinter and puborectalis to push the bowel movement out   Time 8   Period Weeks   Status New   PT LONG TERM GOAL #4   Title pelvic floor strength is 4/5   Time 8   Period Weeks   Status New   PT LONG TERM GOAL #5   Title abdominal strength is 4/5 so patient is able to contract lower abdominals to assist in pushing the bowels out   Time 8   Period Weeks   Status New   Additional Long Term Goals   Additional Long Term Goals Yes   PT LONG TERM GOAL #6   Title abdominal pain and bloating decreased >/= 50%   Time 8   Period Weeks   Status New               Plan - 03/24/15 1527    Clinical Impression Statement  Patient is a 60 year old female with diagnosis of constipation and abdominal weakness for th epast 3 months with sudden onset.  Patient hs irritable bowel syndrome.  Pain in abdomen is 1/10.  Patient reports prior to  3 onths she was abel to have 2-3 bowel movements per week but now 1-2.    Patient reports she has to strain to have a boewl movement.  Bilateral ihp strength is 4/5.  Abdominal strength is 2/5.  Anal strength is 2/5.  When patient contracts the anal sphincter ther external pshinter contracts but the internal sphincter and puborectalis does not  contraction.  Hypotonisity of anal region is felt.  Patient was not able to push the therapist finger out of the anal area  when bearing down like she is having a bowel movement. Patient will benefit from physicl therapy to imporve pelvic floor strength and soordination.    Pt will benefit from skilled therapeutic intervention in order to improve on the following deficits Increased fascial restricitons;Impaired sensation;Decreased coordination;Hypomobility;Decreased activity tolerance;Decreased endurance;Impaired flexibility;Decreased strength   Rehab Potential Excellent   Clinical Impairments Affecting Rehab Potential None   PT Frequency 1x / week   PT Duration 8 weeks   PT Treatment/Interventions ADLs/Self Care Home Management;Biofeedback;Cryotherapy;Electrical Stimulation;Ultrasound;Moist Heat;Functional mobility training;Therapeutic activities;Therapeutic exercise;Patient/family education;Neuromuscular re-education;Manual techniques;Passive range of motion   PT Next Visit Plan hip strengthes, abdominal contraction, bowel health   PT Home Exercise Plan review toileting technique; flexibility exericses   Recommended Other Services None   Consulted and Agree with Plan of Care Patient          G-Codes - 04-14-15 1458    Functional Assessment Tool Used FOTO score is 40% limitation  goal is 40% limitation   Functional Limitation Other PT primary    Other PT Primary Current Status (Z6109) At least 40 percent but less than 60 percent impaired, limited or restricted   Other PT Primary Goal Status (U0454) At least 20 percent but less than 40 percent impaired, limited or restricted       Problem List Patient Active Problem List   Diagnosis Date Noted  . Breast density 06/25/2012  . Encephalopathy, toxic 04/15/2012  . Lithium toxicity 04/15/2012  . Suicide ideation 04/15/2012  . LADA (latent autoimmune diabetes in adults), managed as type 1 (HCC)   . Depression   . IBS (irritable bowel syndrome)   . HTN (hypertension)   . Bipolar 1 disorder (HCC)   . Abnormality of gait 11/07/2011  . Muscle weakness (generalized) 11/07/2011    Eulis Foster, PT 04/14/2015 5:11 PM   [facility] [address], [address] [place], [place], [address] Phone: [phone]   Fax:  [phone]  Name: [name] MRN: [id number] Date of Birth: 1955-09-07

## 2015-03-24 NOTE — Patient Instructions (Signed)
About Abdominal Massage  Abdominal massage, also called external colon massage, is a self-treatment circular massage technique that can reduce and eliminate gas and ease constipation. The colon naturally contracts in waves in a clockwise direction starting from inside the right hip, moving up toward the ribs, across the belly, and down inside the left hip.  When you perform circular abdominal massage, you help stimulate your colon's normal wave pattern of movement called peristalsis.  It is most beneficial when done after eating.  Positioning You can practice abdominal massage with oil while lying down, or in the shower with soap.  Some people find that it is just as effective to do the massage through clothing while sitting or standing.  How to Massage Start by placing your finger tips or knuckles on your right side, just inside your hip bone.  . Make small circular movements while you move upward toward your rib cage.   . Once you reach the bottom right side of your rib cage, take your circular movements across to the left side of the bottom of your rib cage.  . Next, move downward until you reach the inside of your left hip bone.  This is the path your feces travel in your colon. . Continue to perform your abdominal massage in this pattern for 10 minutes each day.     You can apply as much pressure as is comfortable in your massage.  Start gently and build pressure as you continue to practice.  Notice any areas of pain as you massage; areas of slight pain may be relieved as you massage, but if you have areas of significant or intense pain, consult with your healthcare provider.  Other Considerations . General physical activity including bending and stretching can have a beneficial massage-like effect on the colon.  Deep breathing can also stimulate the colon because breathing deeply activates the same nervous system that supplies the colon.   . Abdominal massage should always be used in  combination with a bowel-conscious diet that is high in the proper type of fiber for you, fluids (primarily water), and a regular exercise program. Toileting Techniques for Bowel Movements (Defecation) Using your belly (abdomen) and pelvic floor muscles to have a bowel movement is usually instinctive.  Sometimes people can have problems with these muscles and have to relearn proper defecation (emptying) techniques.  If you have weakness in your muscles, organs that are falling out, decreased sensation in your pelvis, or ignore your urge to go, you may find yourself straining to have a bowel movement.  You are straining if you are: . holding your breath or taking in a huge gulp of air and holding it  . keeping your lips and jaw tensed and closed tightly . turning red in the face because of excessive pushing or forcing . developing or worsening your  hemorrhoids . getting faint while pushing . not emptying completely and have to defecate many times a day  If you are straining, you are actually making it harder for yourself to have a bowel movement.  Many people find they are pulling up with the pelvic floor muscles and closing off instead of opening the anus. Due to lack pelvic floor relaxation and coordination the abdominal muscles, one has to work harder to push the feces out.  Many people have never been taught how to defecate efficiently and effectively.  Notice what happens to your body when you are having a bowel movement.  While you are sitting on the   toilet pay attention to the following areas: . Jaw and mouth position . Angle of your hips   . Whether your feet touch the ground or not . Arm placement  . Spine position . Waist . Belly tension . Anus (opening of the anal canal)  An Evacuation/Defecation Plan   Here are the 4 basic points:  1. Lean forward enough for your elbows to rest on your knees 2. Support your feet on the floor or use a low stool if your feet don't touch the floor   3. Push out your belly as if you have swallowed a beach ball-you should feel a widening of your waist 4. Open and relax your pelvic floor muscles, rather than tightening around the anus      The following conditions my require modifications to your toileting posture:  . If you have had surgery in the past that limits your back, hip, pelvic, knee or ankle flexibility . Constipation   Your healthcare practitioner may make the following additional suggestions and adjustments:  1) Sit on the toilet  a) Make sure your feet are supported. b) Notice your hip angle and spine position-most people find it effective to lean forward or raise their knees, which can help the muscles around the anus to relax  c) When you lean forward, place your forearms on your thighs for support  2) Relax suggestions a) Breath deeply in through your nose and out slowly through your mouth as if you are smelling the flowers and blowing out the candles. b) To become aware of how to relax your muscles, contracting and releasing muscles can be helpful.  Pull your pelvic floor muscles in tightly by using the image of holding back gas, or closing around the anus (visualize making a circle smaller) and lifting the anus up and in.  Then release the muscles and your anus should drop down and feel open. Repeat 5 times ending with the feeling of relaxation. c) Keep your pelvic floor muscles relaxed; let your belly bulge out. d) The digestive tract starts at the mouth and ends at the anal opening, so be sure to relax both ends of the tube.  Place your tongue on the roof of your mouth with your teeth separated.  This helps relax your mouth and will help to relax the anus at the same time.  3) Empty (defecation) a) Keep your pelvic floor and sphincter relaxed, then bulge your anal muscles.  Make the anal opening wide.  b) Stick your belly out as if you have swallowed a beach ball. c) Make your belly wall hard using your belly  muscles while continuing to breathe. Doing this makes it easier to open your anus. d) Breath out and give a grunt (or try using other sounds such as ahhhh, shhhhh, ohhhh or grrrrrrr).  4) Finish a) As you finish your bowel movement, pull the pelvic floor muscles up and in.  This will leave your anus in the proper place rather than remaining pushed out and down. If you leave your anus pushed out and down, it will start to feel as though that is normal and give you incorrect signals about needing to have a bowel movement.    Severin Bou, PT [facility] [address] [place], [place] [address]  

## 2015-03-30 ENCOUNTER — Ambulatory Visit: Admitting: Physical Therapy

## 2015-03-30 ENCOUNTER — Encounter: Payer: Self-pay | Admitting: Physical Therapy

## 2015-03-30 DIAGNOSIS — R198 Other specified symptoms and signs involving the digestive system and abdomen: Secondary | ICD-10-CM

## 2015-03-30 DIAGNOSIS — M6289 Other specified disorders of muscle: Secondary | ICD-10-CM

## 2015-03-30 DIAGNOSIS — R29898 Other symptoms and signs involving the musculoskeletal system: Secondary | ICD-10-CM

## 2015-03-30 DIAGNOSIS — N8189 Other female genital prolapse: Secondary | ICD-10-CM

## 2015-03-30 DIAGNOSIS — N8184 Pelvic muscle wasting: Secondary | ICD-10-CM | POA: Diagnosis not present

## 2015-03-30 NOTE — Patient Instructions (Addendum)
Piriformis Stretch, Sitting    Sit, one ankle on opposite knee, same-side hand on crossed knee. Push down on knee, keeping spine straight. Lean torso forward, with flat back, until tension is felt in hamstrings and gluteals of crossed-leg side. Hold _30__ seconds.  Repeat _2__ times per session. Then do the other leg.  Do __1_ sessions per day.  Copyright  VHI. All rights reserved.  Chair Sitting    Sit at edge of seat, spine straight, one leg extended and foot pointed upward. Put a hand on each thigh and bend forward from the hip, keeping spine straight. Allow hand on extended leg to reach toward toes. Support upper body with other arm. Hold _30__ seconds. Repeat _2__ times per session. Then do the other leg . Do __1_ sessions per day.  Copyright  VHI. All rights reserved.  Supine Knee to Chest    Lie on back. Gently pull one knee toward chest. Hold _30__ seconds.  Repeat __2_ times per session.Then do the other leg Do _1__ sessions per day.  Copyright  VHI. All rights reserved.  Butterfly, Supine    Lie on back, feet together. Lower knees toward floor. Hold _30__ seconds. Repeat _2__ times per session. Do _1__ sessions per day.  Copyright  VHI. All rights reserved.  Lower Trunk Rotation Stretch    Keeping back flat and feet together, rotate knees to left side. Hold _30___ seconds. Repeat _2___ times per set. Then do the other side. Do __1__ sets per session. Do __1__ sessions per day.  http://orth.exer.us/123   Copyright  VHI. All rights reserved.    Introduction to Bowel Health Diet and daily habits can help you predict when your bowels will move on a regular basis.  The consistency and quantity of the stool is usually more important than the frequency.  The goal is to have a regular bowel movement that is soft but formed.   Tips on Emptying Regularly . Eat breakfast.  Usually the best time of day for a bowel movement will be a half hour to an hour after  eating.  These times are best because the body uses the gastrocolic reflex, a stimulation of bowel motion that occurs with eating, to help produce a bowel movement.  For some people even a simple hot drink in the morning can help the reflex action begin. . Eat all your meals at a predictable time each day.  The bowel functions best when food is introduced at the same regular intervals. . The amount of food eaten at a given time of day should be about the same size from day to day.  The bowel functions best when food is introduced in similar quantities from day to day. It is fine to have a small breakfast and a large lunch, or vice versa, just be consistent. . Eat two servings of fruit or vegetables and at least one serving of a complex carbohydrates (whole grains such as brown rice, bran, whole wheat bread, or oatmeal) at each meal. . Drink plenty of water-ideally eight glasses a day.  Be sure to increase your water intake if you are increasing fiber into your diet.  Maintain Healthy Habits . Exercise daily.  You may exercise at any time of day, but you may find that bowel function is helped most if the exercise is at a consistent time each day. . Make sure that you are not rushed and have convenient access to a bathroom at your selected time to empty your bowels.  Adduction: Hip - Knees Together (Hook-Lying)    Lie with hips and knees bent, towel roll between knees. Push knees together. Hold for _10__ seconds. Rest for 5___ seconds. Repeat _10__ times. Do _1__ times a day.   Copyright  VHI. All rights reserved.    External Rotation: Hip - Knees Apart (Hook-Lying)    Lie with hips and knees bent, yellow band tied just above knees. Pull knees apart. Hold for _5__ seconds. Rest for _5__ seconds. Repeat _10__ times. Do 1___ times a day.  Copyright  VHI. All rights reserved.  Diastasis Recti Correction With Towel (Hook-Lying)    Loop long towel or sheet under back and criss-cross  over lower abdomen. Inhale. In one fluid movement: Pull in navel, pull towel tight across abdomen, exhale, raise head toward chest, and Hold for _5__ seconds. Return, rest for ___ seconds. Repeat _10__ times. Do __2_ times a day.   Copyright  VHI. All rights reserved.  [facility] [address], [address] [place], [place] [address] Phone # [phone] Fax [phone]

## 2015-03-30 NOTE — Therapy (Signed)
[facility] [address] [place], [place], [address] Phone: [phone]   Fax:  [phone]  Physical Therapy Treatment  Patient Details  Name: [name] MRN: [id number] Date of Birth: [birth year] Referring Provider: Dr. Harl Bowie  Encounter Date: 03/30/2015      PT End of Session - 03/30/15 1405    Visit Number 2   Date for PT Re-Evaluation 05/19/15   Authorization Type tricare/coinsurance 24 limit visit within 60 days   Authorization - Visit Number 2   Authorization - Number of Visits 24   PT Start Time 6767   PT Stop Time 1440   PT Time Calculation (min) 35 min   Activity Tolerance Patient tolerated treatment well   Behavior During Therapy [facility] for tasks assessed/performed      Past Medical History  Diagnosis Date  . Diabetes mellitus without complication ([place])   . Depression   . IBS (irritable bowel syndrome)   . HTN (hypertension)   . Bipolar 1 disorder ([place])   . Hyperlipidemia   . Breast density 06/25/2012    Right breast density, will get mammogram and [place]  . Duodenitis     Past Surgical History  Procedure Laterality Date  . Cholecystectomy    . Abdominal hysterectomy    . Colonoscopy      There were no vitals filed for this visit.  Visit Diagnosis:  Pelvic floor dysfunction  Pelvic floor weakness  Abdominal weakness  Weakness of both hips      Subjective Assessment - 03/30/15 1406    Subjective I have been doing the breathing.  I have had 4 bowel movements since last time I was at therapy.     Patient Stated Goals improve consistent bowel movements.    Currently in Pain? No/denies                         [facility] Adult PT Treatment/Exercise - 03/30/15 0001    Self-Care   Self-Care Other Self-Care Comments   Other Self-Care Comments  reviewed toileting technique   Lumbar Exercises: Supine   Clam 10 reps;5 seconds;Other (comment)  contract pelvic floor    Clam Limitations yellow band   Knee/Hip Exercises: Supine   Hip Adduction Isometric 1 set;Both;Strengthening;Other (comment)  hold 10 sec with pelvic floor contraction   Hip Adduction Isometric Limitations needs tactile cues to isolate muscles                PT Education - 03/30/15 1422    Education provided Yes   Education Details flexibility exercises, bowel health; hookly hip adduction, hookly knees out with yellow band; abdominal contraction with assistance of sheet   Person(s) Educated Patient   Methods Explanation;Demonstration;Verbal cues;Handout   Comprehension Returned demonstration;Verbalized understanding          PT Short Term Goals - 03/30/15 1426    PT SHORT TERM GOAL #1   Title understand correct toileting technique to have a bowel movement   Time 4   Period Weeks   Status Achieved   PT SHORT TERM GOAL #2   Title independent with initial flexibility exercises   Time 4   Period Weeks   Status On-going  just learning           PT Long Term Goals - 03/24/15 1707    PT LONG TERM GOAL #1   Title independent with HEP   Time 8   Period Weeks  Status New   PT LONG TERM GOAL #2   Title ability to have a bowel movement without straining due to relaxation of pelvic floor   Time 8   Period Weeks   Status New   PT LONG TERM GOAL #3   Title ability to contract the internal anal sphinter and puborectalis to push the bowel movement out   Time 8   Period Weeks   Status New   PT LONG TERM GOAL #4   Title pelvic floor strength is 4/5   Time 8   Period Weeks   Status New   PT LONG TERM GOAL #5   Title abdominal strength is 4/5 so patient is able to contract lower abdominals to assist in pushing the bowels out   Time 8   Period Weeks   Status New   Additional Long Term Goals   Additional Long Term Goals Yes   PT LONG TERM GOAL #6   Title abdominal pain and bloating decreased >/= 50%   Time 8   Period Weeks   Status New                Plan - 03/30/15 1437    Clinical Impression Statement Patient is a 60 year old female with diagnosis of constipation and abdominal weakness.  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 Patient has learned pelvic floor contraction to bulid up strength.  Patient  has met goal for understanding correct toileting technique.  Patient will benefit form physical therapy to increase strength.    Pt will benefit from skilled therapeutic intervention in order to improve on the following deficits Increased fascial restricitons;Impaired sensation;Decreased coordination;Hypomobility;Decreased activity tolerance;Decreased endurance;Impaired flexibility;Decreased strength   Rehab Potential Excellent   Clinical Impairments Affecting Rehab Potential None   PT Frequency 1x / week   PT Duration 8 weeks   PT Treatment/Interventions ADLs/Self Care Home Management;Biofeedback;Cryotherapy;Electrical Stimulation;Ultrasound;Moist Heat;Functional mobility training;Therapeutic activities;Therapeutic exercise;Patient/family education;Neuromuscular re-education;Manual techniques;Passive range of motion   PT Next Visit Plan pelvic floor contraction, standing hip exercise   PT Home Exercise Plan progress as needed   Consulted and Agree with Plan of Care Patient        Problem List Patient Active Problem List   Diagnosis Date Noted  . Breast density 06/25/2012  . Encephalopathy, toxic 04/15/2012  . Lithium toxicity 04/15/2012  . Suicide ideation 04/15/2012  . LADA (latent autoimmune diabetes in adults), managed as type 1 ([place])   . Depression   . IBS (irritable bowel syndrome)   . HTN (hypertension)   . Bipolar 1 disorder ([place])   . Abnormality of gait 11/07/2011  . Muscle weakness (generalized) 11/07/2011    Earlie Counts, PT 03/30/2015 2:41 PM    [facility] [address], [place], [place], [address] Phone:  [phone]   Fax:  [phone]  Name: SHIORI ADCOX MRN: [id number] Date of Birth: 17-Mar-1955

## 2015-04-05 ENCOUNTER — Telehealth: Payer: Self-pay | Admitting: Gastroenterology

## 2015-04-05 ENCOUNTER — Ambulatory Visit: Payer: Self-pay | Admitting: Gastroenterology

## 2015-04-05 NOTE — Telephone Encounter (Signed)
Patient is doing PT for her pelvic floor dysfunction. Discussed goals as per the PT notes. Encouragement offered to the patient.

## 2015-04-06 ENCOUNTER — Encounter: Payer: Self-pay | Admitting: Physical Therapy

## 2015-04-07 ENCOUNTER — Telehealth: Payer: Self-pay | Admitting: Internal Medicine

## 2015-04-07 NOTE — Telephone Encounter (Signed)
She can check with her PCP. Usually pain can increase sugars, not necessarily pain meds.

## 2015-04-07 NOTE — Telephone Encounter (Signed)
PT would like to know what is something she can take for pain for her back and Fibromyalgia that does not interfere with her sugars.  She said that OTC and Tramadol does not work.

## 2015-04-07 NOTE — Telephone Encounter (Signed)
Please read message below and advise.  

## 2015-04-08 NOTE — Telephone Encounter (Signed)
Returned pt's call and advised her per Dr Charlean SanfilippoGherghe's message. She voiced understanding.

## 2015-04-11 ENCOUNTER — Telehealth: Payer: Self-pay | Admitting: Internal Medicine

## 2015-04-11 NOTE — Telephone Encounter (Signed)
Called pt and advised her per Dr Remus BlakeKumar's directions. Pt voiced understanding.

## 2015-04-11 NOTE — Telephone Encounter (Signed)
Pt's morning sugars are still high most are more than 150-200

## 2015-04-11 NOTE — Telephone Encounter (Signed)
Please read message below and advise in Dr Gherghe's absence. Thank you.  

## 2015-04-11 NOTE — Telephone Encounter (Signed)
Increase Lantus to 34 units in the evening

## 2015-04-13 ENCOUNTER — Encounter: Payer: Self-pay | Admitting: Physical Therapy

## 2015-04-13 ENCOUNTER — Ambulatory Visit: Attending: Gastroenterology | Admitting: Physical Therapy

## 2015-04-13 ENCOUNTER — Other Ambulatory Visit: Payer: Self-pay | Admitting: *Deleted

## 2015-04-13 DIAGNOSIS — M6281 Muscle weakness (generalized): Secondary | ICD-10-CM

## 2015-04-13 DIAGNOSIS — R279 Unspecified lack of coordination: Secondary | ICD-10-CM

## 2015-04-13 MED ORDER — INSULIN GLARGINE 100 UNIT/ML SOLOSTAR PEN: PEN_INJECTOR | SUBCUTANEOUS | Status: DC

## 2015-04-13 MED ORDER — INSULIN ASPART 100 UNIT/ML FLEXPEN: 10.0000 [IU] | PEN_INJECTOR | Freq: Three times a day (TID) | SUBCUTANEOUS | Status: DC

## 2015-04-13 NOTE — Therapy (Signed)
[facility] [address] [place], [place], [address] Phone: [phone]   Fax:  [phone]  Physical Therapy Treatment  Patient Details  Name: [name] MRN: [id number] Date of Birth: [birth year] Referring Provider: Dr. Harl Bowie  Encounter Date: 04/13/2015      PT End of Session - 04/13/15 1458    Visit Number 3   Date for PT Re-Evaluation 05/19/15   Authorization Type tricare/coinsurance 24 limit visit within 60 days   Authorization - Visit Number 3   Authorization - Number of Visits 24   PT Start Time 0240   PT Stop Time 1530   PT Time Calculation (min) 39 min   Activity Tolerance Patient limited by pain   Behavior During Therapy [facility] for tasks assessed/performed      Past Medical History  Diagnosis Date  . Diabetes mellitus without complication ([place])   . Depression   . IBS (irritable bowel syndrome)   . HTN (hypertension)   . Bipolar 1 disorder ([place])   . Hyperlipidemia   . Breast density 06/25/2012    Right breast density, will get mammogram and [place]  . Duodenitis     Past Surgical History  Procedure Laterality Date  . Cholecystectomy    . Abdominal hysterectomy    . Colonoscopy      There were no vitals filed for this visit.      Subjective Assessment - 04/13/15 1455    Subjective I have had 10 bowel movements in the past 14 days.  The best it has been.    Patient Stated Goals improve consistent bowel movements.    Currently in Pain? Yes   Pain Score 6    Pain Location Back   Pain Orientation Right;Left   Pain Descriptors / Indicators Aching   Pain Type Chronic pain   Pain Onset More than a month ago   Pain Frequency Constant   Aggravating Factors  movement, bending over, lifting   Pain Relieving Factors lidocaine patch   Multiple Pain Sites No            [facility] PT Assessment - 04/13/15 0001    Assessment   Medical Diagnosis R19.8 Abdominal weakness; K59.00 Constipation  inspecified constipation type; K59.02 Constipation, outlet dysfunction   Onset Date/Surgical Date 01/02/15   Prior Therapy None   Precautions   Precautions None   Restrictions   Weight Bearing Restrictions No   Balance Screen   Has the patient fallen in the past 6 months No   Has the patient had a decrease in activity level because of a fear of falling?  No   Is the patient reluctant to leave their home because of a fear of falling?  No   Prior Function   Level of Independence Independent   Vocation On disability   Leisure walking 1 mile per day    Cognition   Overall Cognitive Status Within Functional Limits for tasks assessed   Observation/Other Assessments   Focus on Therapeutic Outcomes (FOTO)  59% limitation CK  goal is 40% limitation   Posture/Postural Control   Posture/Postural Control Postural limitations   Postural Limitations Rounded Shoulders;Forward head   AROM   Overall AROM Comments lumbar ROM is full   Strength   Overall Strength Comments bil. hip strength is 4/5 and abdominal strength is 2/5   Palpation   Palpation comment tenderness located in mide abdoment with firnmess and on right side of abdomen  Pelvic Floor Special Questions - 04/13/15 0001    Strength weak squeeze, no lift           [facility] Adult PT Treatment/Exercise - 04/13/15 0001    Lumbar Exercises: Supine   Ab Set 5 reps;5 seconds   AB Set Limitations therapist giving patient tactile cues    Clam 10 reps;Limitations;Other (comment)   Clam Limitations one leg, tactile cues for abdominal contractoin   Isometric Hip Flexion 5 reps;5 seconds  each leg, abdominal bracing   Other Supine Lumbar Exercises abdominal contraction in hookly alternate shoulder flexion 10x each side   Modalities   Modalities Moist Heat   Moist Heat Therapy   Number Minutes Moist Heat 25 Minutes   Moist Heat Location Lumbar Spine  while exercising   Manual Therapy   Manual Therapy Soft  tissue mobilization   Soft tissue mobilization to abdominal muscles to promote bowel movements                PT Education - 04/13/15 1526    Education provided No          PT Short Term Goals - 04/13/15 1456    PT SHORT TERM GOAL #2   Title independent with initial flexibility exercises   Time 4   Period Weeks   Status Achieved           PT Long Term Goals - 04/13/15 1456    PT LONG TERM GOAL #1   Title independent with HEP   Time 8   Period Weeks   Status On-going  still learning   PT LONG TERM GOAL #2   Title ability to have a bowel movement without straining due to relaxation of pelvic floor   Time 8   Period Weeks   Status On-going  50% less straining   PT LONG TERM GOAL #3   Title ability to contract the internal anal sphinter and puborectalis to push the bowel movement out   Time 8   Period Weeks   Status On-going   PT LONG TERM GOAL #4   Title pelvic floor strength is 4/5   Time 8   Period Weeks   Status On-going   PT LONG TERM GOAL #5   Title abdominal strength is 4/5 so patient is able to contract lower abdominals to assist in pushing the bowels out   Time 8   Period Weeks   Status On-going   PT LONG TERM GOAL #6   Title abdominal pain and bloating decreased >/= 50%   Time 8   Status On-going  40% better               Plan - 04/13/15 1527    Clinical Impression Statement Patient is a 60 year old female with diagnosis of constipation and abdominal weakness.  Patient pelvic floor strength is 2/5 makng it difficult to push bowels out.  Patient is straining 50% less since initial evaluation.  Patient abdominal strength is 2/5.  Paient has tightness located in upper abdomen and diaphgram.  Patient has met her STG's.  Patient has had 10 bowel movements in 14 days which is an improvement. Patient will bnefit from physical therapy to reduce pain and improve bowel movements.    Rehab Potential Excellent   Clinical Impairments Affecting  Rehab Potential None   PT Frequency 1x / week   PT Duration 8 weeks   PT Treatment/Interventions ADLs/Self Care Home Management;Biofeedback;Cryotherapy;Electrical Stimulation;Ultrasound;Moist Heat;Functional mobility training;Therapeutic activities;Therapeutic exercise;Patient/family education;Neuromuscular re-education;Manual  techniques;Passive range of motion   PT Next Visit Plan pelvic floor contraction with EMG,   PT Home Exercise Plan progress as needed   Consulted and Agree with Plan of Care Patient      Patient will benefit from skilled therapeutic intervention in order to improve the following deficits and impairments:  Increased fascial restricitons, Impaired sensation, Decreased coordination, Hypomobility, Decreased activity tolerance, Decreased endurance, Impaired flexibility, Decreased strength  Visit Diagnosis: Muscle weakness (generalized) - Plan: PT plan of care cert/re-cert  Unspecified lack of coordination - Plan: PT plan of care cert/re-cert     Problem List Patient Active Problem List   Diagnosis Date Noted  . Breast density 06/25/2012  . Encephalopathy, toxic 04/15/2012  . Lithium toxicity 04/15/2012  . Suicide ideation 04/15/2012  . LADA (latent autoimmune diabetes in adults), managed as type 1 ([place])   . Depression   . IBS (irritable bowel syndrome)   . HTN (hypertension)   . Bipolar 1 disorder ([place])   . Abnormality of gait 11/07/2011  . Muscle weakness (generalized) 11/07/2011   Earlie Counts, PT 04/13/2015 3:32 PM   [facility] [address], [place], [place], [address] Phone: [phone]   Fax:  [phone]  Name: [name] MRN: [id number] Date of Birth: [birth year]

## 2015-04-14 ENCOUNTER — Telehealth: Payer: Self-pay | Admitting: Internal Medicine

## 2015-04-14 NOTE — Telephone Encounter (Signed)
Pt needs us to be sure [facility] a 90 day supply of insulin

## 2015-04-18 ENCOUNTER — Other Ambulatory Visit: Payer: Self-pay

## 2015-04-18 MED ORDER — INSULIN GLARGINE 100 UNIT/ML SOLOSTAR PEN: PEN_INJECTOR | SUBCUTANEOUS | Status: DC

## 2015-04-18 MED ORDER — INSULIN ASPART 100 UNIT/ML FLEXPEN: 10.0000 [IU] | PEN_INJECTOR | Freq: Three times a day (TID) | SUBCUTANEOUS | Status: DC

## 2015-04-18 NOTE — Telephone Encounter (Signed)
[facility] stated [facility] need one more prescription of Lantus solostar. Please advise

## 2015-04-18 NOTE — Telephone Encounter (Signed)
Rx submitted

## 2015-04-19 ENCOUNTER — Encounter: Payer: Self-pay | Admitting: Physical Therapy

## 2015-04-19 ENCOUNTER — Ambulatory Visit: Admitting: Physical Therapy

## 2015-04-19 DIAGNOSIS — M6281 Muscle weakness (generalized): Secondary | ICD-10-CM | POA: Diagnosis not present

## 2015-04-19 DIAGNOSIS — R279 Unspecified lack of coordination: Secondary | ICD-10-CM

## 2015-04-19 NOTE — Therapy (Signed)
[facility] [name], [place], [place], [address] Phone: [phone]   Fax:  [phone]  Physical Therapy Treatment  Patient Details  Name: [name] MRN: [id number] Date of Birth: [birth year] Referring Provider: Dr. Harl Bowie  Encounter Date: 04/19/2015      PT End of Session - 04/19/15 1432    Visit Number 4   Date for PT Re-Evaluation 05/19/15   Authorization Type tricare/coinsurance 24 limit visit within 60 days   Authorization - Visit Number 4   Authorization - Number of Visits 24   PT Start Time 1400   PT Stop Time 1438   PT Time Calculation (min) 38 min   Activity Tolerance Patient tolerated treatment well   Behavior During Therapy [facility] for tasks assessed/performed      Past Medical History  Diagnosis Date  . Diabetes mellitus without complication ([place])   . Depression   . IBS (irritable bowel syndrome)   . HTN (hypertension)   . Bipolar 1 disorder ([place])   . Hyperlipidemia   . Breast density 06/25/2012    Right breast density, will get mammogram and [place]  . Duodenitis     Past Surgical History  Procedure Laterality Date  . Cholecystectomy    . Abdominal hysterectomy    . Colonoscopy      There were no vitals filed for this visit.      Subjective Assessment - 04/19/15 1404    Subjective Today is a bad day.  My stomach feels bloated. Going ot the bathroom was bad last week.  I had trouble having a bowel movement.    Patient Stated Goals improve consistent bowel movements.    Currently in Pain? Yes   Pain Score 5    Pain Location Back   Pain Orientation Right;Left;Lower   Pain Descriptors / Indicators Aching   Pain Type Chronic pain   Pain Onset More than a month ago   Pain Frequency Constant   Aggravating Factors  movement, bending over, lifting   Pain Relieving Factors lidocaine patch   Multiple Pain Sites No                         [facility] Adult PT  Treatment/Exercise - 04/19/15 0001    Modalities   Modalities Moist Heat   Moist Heat Therapy   Moist Heat Location Lumbar Spine  while exercising   Manual Therapy   Manual Therapy Soft tissue mobilization;Myofascial release   Soft tissue mobilization to abdominal muscles to promote bowel movements   Myofascial Release tissue rolling the abdomen                PT Education - 04/19/15 1432    Education provided Yes   Education Details bear down and lift up with pelvic floor    Person(s) Educated Patient   Methods Explanation;Demonstration;Verbal cues;Handout   Comprehension Returned demonstration;Verbalized understanding          PT Short Term Goals - 04/13/15 1456    PT SHORT TERM GOAL #2   Title independent with initial flexibility exercises   Time 4   Period Weeks   Status Achieved           PT Long Term Goals - 04/19/15 1433    PT LONG TERM GOAL #1   Title independent with HEP   Time 8   Status On-going  still learning   PT LONG TERM GOAL #2  Title ability to have a bowel movement without straining due to relaxation of pelvic floor   Time 8   Period Weeks   Status On-going  50% less straining   PT LONG TERM GOAL #3   Title ability to contract the internal anal sphinter and puborectalis to push the bowel movement out   Time 8   Period Weeks   Status On-going   PT LONG TERM GOAL #4   Title pelvic floor strength is 4/5   Time 8   Period Weeks   Status On-going   PT LONG TERM GOAL #5   Title abdominal strength is 4/5 so patient is able to contract lower abdominals to assist in pushing the bowels out   Time 8   Period Weeks   Status On-going   PT LONG TERM GOAL #6   Title abdominal pain and bloating decreased >/= 50%   Time 8   Period Weeks   Status On-going               Plan - 04/19/15 1436    Clinical Impression Statement Patient is a 60 year old female with diagnosis of constipation and abdominal weakness. Patient abdomen was  bloated today but after therapy it decreased. Back pain started at 5/10 and after therapy decreased to 3/10.  The nustep helped patient with her overall pain .  Patient learned how to bear down  while pushing into a towel and was able to do correctly. Patient has not met goals today due to increased pain.    Rehab Potential Excellent   Clinical Impairments Affecting Rehab Potential None   PT Frequency 1x / week   PT Duration 8 weeks   PT Treatment/Interventions ADLs/Self Care Home Management;Biofeedback;Cryotherapy;Electrical Stimulation;Ultrasound;Moist Heat;Functional mobility training;Therapeutic activities;Therapeutic exercise;Patient/family education;Neuromuscular re-education;Manual techniques;Passive range of motion   PT Next Visit Plan pelvic floor contraction with EMG,; nustep   PT Home Exercise Plan progress as needed   Consulted and Agree with Plan of Care Patient      Patient will benefit from skilled therapeutic intervention in order to improve the following deficits and impairments:  Increased fascial restricitons, Impaired sensation, Decreased coordination, Hypomobility, Decreased activity tolerance, Decreased endurance, Impaired flexibility, Decreased strength  Visit Diagnosis: Muscle weakness (generalized)  Unspecified lack of coordination     Problem List Patient Active Problem List   Diagnosis Date Noted  . Breast density 06/25/2012  . Encephalopathy, toxic 04/15/2012  . Lithium toxicity 04/15/2012  . Suicide ideation 04/15/2012  . LADA (latent autoimmune diabetes in adults), managed as type 1 ([place])   . Depression   . IBS (irritable bowel syndrome)   . HTN (hypertension)   . Bipolar 1 disorder ([place])   . Abnormality of gait 11/07/2011  . Muscle weakness (generalized) 11/07/2011    Earlie Counts, PT 04/19/2015 2:39 PM   [facility] [address] [place], [place], [address] Phone: [phone]    Fax:  [phone]  Name: [name] MRN: [id number] Date of Birth: 05-10-1955

## 2015-04-19 NOTE — Patient Instructions (Signed)
Bear Down    Exhaling, bear down as if to have a bowel movement while sitting on a towel that is pulled up. Then pull up the anus to belly button.  Repeat _10__ times. Do ___2times a day.  Copyright  VHI. All rights reserved.   [facility] [address], [address] [place], [place] [address] Phone # [phone] Fax [phone]

## 2015-04-20 ENCOUNTER — Other Ambulatory Visit: Payer: Self-pay | Admitting: *Deleted

## 2015-04-20 ENCOUNTER — Telehealth: Payer: Self-pay | Admitting: Internal Medicine

## 2015-04-20 MED ORDER — INSULIN ASPART 100 UNIT/ML FLEXPEN: 10.0000 [IU] | PEN_INJECTOR | Freq: Three times a day (TID) | SUBCUTANEOUS | Status: DC

## 2015-04-20 NOTE — Telephone Encounter (Signed)
Pt wants her Lantus to go to [facility].

## 2015-04-20 NOTE — Telephone Encounter (Signed)
[facility]  67 day supply of insulin aspart (NOVOLOG FLEXPEN) 100 UNIT/ML FlexPen  she states that not enough to las her. She need a 90 day supply. Please advise

## 2015-04-20 NOTE — Telephone Encounter (Signed)
Recalculated insulin dosage needed per day and patient will need 45 mL's for 90 day supply. Resending to [facility] for pt.

## 2015-04-20 NOTE — Telephone Encounter (Signed)
Pt needs us to call in 2 new rx on for the novolog for a 90 day supply not 67 which is what the pharmacy says we called in and the lantus needs the new dosing called in 34 u at night and 10 in the AM

## 2015-04-21 ENCOUNTER — Telehealth: Payer: Self-pay | Admitting: Internal Medicine

## 2015-04-21 NOTE — Telephone Encounter (Signed)
Error

## 2015-04-26 ENCOUNTER — Telehealth (HOSPITAL_COMMUNITY): Payer: Self-pay

## 2015-04-26 NOTE — Telephone Encounter (Signed)
Patient calling, she is not sure what to do, she reports having increased anxiety and depression. She is not sleeping and has no appetite, she said she feels all muddled. Please review and advise, thank you

## 2015-04-27 ENCOUNTER — Telehealth: Payer: Self-pay | Admitting: Gastroenterology

## 2015-04-27 ENCOUNTER — Encounter: Payer: Self-pay | Admitting: Physical Therapy

## 2015-04-27 MED ORDER — LINACLOTIDE 72 MCG PO CAPS: 72.0000 ug | ORAL_CAPSULE | Freq: Every day | ORAL | Status: DC

## 2015-04-27 NOTE — Telephone Encounter (Signed)
She is on high dose Amitiza. We can switch her to linzess daily.

## 2015-04-27 NOTE — Telephone Encounter (Signed)
Spoke with patient she claimes  Linzess 145 gave her uncontrollable diarrhea  So spoke with Dr Lavon PaganiniNandigam and she said send her in Linzess 72 mcg    Patient agreed and wanted sent to [facility]  Med sent

## 2015-04-27 NOTE — Telephone Encounter (Signed)
Dr Lavon PaganiniNandigam Please Clide Deutscherasvise

## 2015-05-02 ENCOUNTER — Ambulatory Visit: Admitting: Physical Therapy

## 2015-05-05 ENCOUNTER — Telehealth: Payer: Self-pay | Admitting: Internal Medicine

## 2015-05-05 NOTE — Telephone Encounter (Signed)
PT said about three days ago she has had a severe sensation of painful pins and needles in her feet and now hands, she said she didn't know which doctor she needed to call so she wanted to ask Dr. Charlean SanfilippoGherghe's advice first.

## 2015-05-05 NOTE — Telephone Encounter (Signed)
Please advise, thank you.

## 2015-05-05 NOTE — Telephone Encounter (Signed)
Please check with PCP.

## 2015-05-06 NOTE — Telephone Encounter (Signed)
I left a vm advising of note below. Requested a call back if the pt would like to discuss.  

## 2015-05-09 ENCOUNTER — Telehealth (HOSPITAL_COMMUNITY): Payer: Self-pay

## 2015-05-09 ENCOUNTER — Telehealth: Payer: Self-pay | Admitting: Internal Medicine

## 2015-05-09 NOTE — Telephone Encounter (Signed)
Called pt and she advised that her highs are high before dinner. Pt stated that she's had 6 before breakfast that were over 200 and 3 before lunch, but the majority is before dinner. Please advise.

## 2015-05-09 NOTE — Telephone Encounter (Signed)
March 12 to may 9th she has gone over 200 25 times out of 27 days

## 2015-05-09 NOTE — Telephone Encounter (Signed)
Patient is calling because her PCP is putting her on Nucynta for her chronic pain, the Norco was interfering with her IBS. Patient is concerned that the Nucynta and the Prozac will react and cause serotonin syndrome. She said that she is only taking the Nucynta as needed and she knows not to take the Klonopin with it. Please review and advise, thank you

## 2015-05-10 ENCOUNTER — Encounter: Payer: Self-pay | Admitting: Physical Therapy

## 2015-05-10 ENCOUNTER — Ambulatory Visit: Attending: Gastroenterology | Admitting: Physical Therapy

## 2015-05-10 DIAGNOSIS — M6281 Muscle weakness (generalized): Secondary | ICD-10-CM | POA: Diagnosis present

## 2015-05-10 DIAGNOSIS — R279 Unspecified lack of coordination: Secondary | ICD-10-CM | POA: Insufficient documentation

## 2015-05-10 NOTE — Therapy (Signed)
[facility] [address], [address] [place], [place], [address] Phone: [phone]   Fax:  [phone]  Physical Therapy Treatment  Patient Details  Name: [name] MRN: [id number] Date of Birth: September 25, 1955 Referring Provider: Dr. Marsa Aris  Encounter Date: 05/10/2015      PT End of Session - 05/10/15 1612    Visit Number 5   Number of Visits 10   Date for PT Re-Evaluation 07/14/15   Authorization Type tricare/coinsurance 24 limit visit within 60 days   Authorization - Visit Number 5   Authorization - Number of Visits 24   PT Start Time 1530   PT Stop Time 1613   PT Time Calculation (min) 43 min   Activity Tolerance Patient tolerated treatment well   Behavior During Therapy [facility] for tasks assessed/performed      Past Medical History  Diagnosis Date  . Diabetes mellitus without complication ([facility])   . Depression   . IBS (irritable bowel syndrome)   . HTN (hypertension)   . Bipolar 1 disorder ([facility])   . Hyperlipidemia   . Breast density 06/25/2012    Right breast density, will get mammogram and [place]  . Duodenitis     Past Surgical History  Procedure Laterality Date  . Cholecystectomy    . Abdominal hysterectomy    . Colonoscopy      There were no vitals filed for this visit.      Subjective Assessment - 05/10/15 1535    Subjective I am struggling and depression, diabetes, IBS. I am eating salads, vegetables and prunes.    Patient Stated Goals improve consistent bowel movements.    Currently in Pain? Yes   Pain Score 7    Pain Location Other (Comment)  all over body   Pain Orientation Other (Comment)  all over body   Pain Descriptors / Indicators Aching   Pain Type Chronic pain   Pain Onset More than a month ago   Pain Frequency Constant   Aggravating Factors  movement, bending over , lifting   Pain Relieving Factors idocaine patch   Multiple Pain Sites No            [facility] PT Assessment -  05/10/15 0001    Assessment   Medical Diagnosis R19.8 Abdominal weakness; K59.00 Constipation inspecified constipation type; K59.02 Constipation, outlet dysfunction   Onset Date/Surgical Date 01/02/15   Prior Therapy None   Precautions   Precautions None   Restrictions   Weight Bearing Restrictions No   Balance Screen   Has the patient fallen in the past 6 months No   Has the patient had a decrease in activity level because of a fear of falling?  No   Is the patient reluctant to leave their home because of a fear of falling?  No   Prior Function   Level of Independence Independent   Vocation On disability   Leisure walking 1 mile per day    Cognition   Overall Cognitive Status Within Functional Limits for tasks assessed   Observation/Other Assessments   Focus on Therapeutic Outcomes (FOTO)  59% limitation CK  goal is 40% limitation   Posture/Postural Control   Posture/Postural Control Postural limitations   Postural Limitations Rounded Shoulders;Forward head   ROM / Strength   AROM / PROM / Strength AROM;Strength   AROM   Overall AROM Comments lumbar ROM is full   Strength   Overall Strength Comments bil. hip strength is 4/5 and abdominal  strength is 2/5   Palpation   Palpation comment tenderness located in mide abdoment with firnmess and on right side of abdomen                     [facility] Adult PT Treatment/Exercise - 05/10/15 0001    Lumbar Exercises: Stretches   Active Hamstring Stretch 1 rep;30 seconds  bil. in sitting   Lumbar Exercises: Aerobic   Elliptical 2 min level 1   Lumbar Exercises: Seated   Long Arc Quad on Chair Strengthening;Right;Left;10 reps  abdominal bracing   Sit to Stand 5 reps  with  abdominal bracing   Other Seated Lumbar Exercises alternate shoulder flexion with abdominal bracing 10x,    Manual Therapy   Manual Therapy Manual Lymphatic Drainage (MLD);Myofascial release   Myofascial Release coccyx perineal external positional  release in sidely with hands on the perineau hold for 8 min   Manual Lymphatic Drainage (MLD) for colon stimulation                PT Education - 05/10/15 1612    Education provided No          PT Short Term Goals - 04/13/15 1456    PT SHORT TERM GOAL #2   Title independent with initial flexibility exercises   Time 4   Period Weeks   Status Achieved           PT Long Term Goals - 05/10/15 1545    PT LONG TERM GOAL #1   Title independent with HEP   Time 8   Period Weeks   Status On-going  still learning   PT LONG TERM GOAL #2   Title ability to have a bowel movement without straining due to relaxation of pelvic floor   Time 8   Period Weeks   Status On-going  30% less straining   PT LONG TERM GOAL #4   Title pelvic floor strength is 4/5   Time 8   Period Weeks   Status On-going   PT LONG TERM GOAL #5   Title abdominal strength is 4/5 so patient is able to contract lower abdominals to assist in pushing the bowels out   Time 8   Period Weeks   Status On-going  30% easier   PT LONG TERM GOAL #6   Title abdominal pain and bloating decreased >/= 50%   Time 8   Period Weeks   Status On-going  bloating 40% better, pain 30% better               Plan - 05/10/15 1613    Clinical Impression Statement Patient is a 60 year old female with diagnosis of constipation and abdominal weakness.  Abdominal pain has decresaed by 30%.  bloating decreased by 40%. Staining to have a bowel movement has decreased by 40%.  Patient is using the techniques hse has learned to reduce constipation.  Patient is learning how to relax the pelvic floo rfor bowel movements. Patient will benefit from physical therapy to imporve toiieting with relaxion of the sphinter muscles and increase strength to push boewl movement out.    Rehab Potential Excellent   Clinical Impairments Affecting Rehab Potential None   PT Frequency 1x / week   PT Duration 8 weeks   PT  Treatment/Interventions ADLs/Self Care Home Management;Biofeedback;Cryotherapy;Electrical Stimulation;Ultrasound;Moist Heat;Functional mobility training;Therapeutic activities;Therapeutic exercise;Patient/family education;Neuromuscular re-education;Manual techniques;Passive range of motion   PT Next Visit Plan pelvic floor contraction with EMG,; nustep; soft tissue work  PT Home Exercise Plan progress as needed   Consulted and Agree with Plan of Care Patient      Patient will benefit from skilled therapeutic intervention in order to improve the following deficits and impairments:  Increased fascial restricitons, Impaired sensation, Decreased coordination, Hypomobility, Decreased activity tolerance, Decreased endurance, Impaired flexibility, Decreased strength  Visit Diagnosis: Muscle weakness (generalized) - Plan: PT plan of care cert/re-cert  Unspecified lack of coordination - Plan: PT plan of care cert/re-cert     Problem List Patient Active Problem List   Diagnosis Date Noted  . Breast density 06/25/2012  . Encephalopathy, toxic 04/15/2012  . Lithium toxicity 04/15/2012  . Suicide ideation 04/15/2012  . LADA (latent autoimmune diabetes in adults), managed as type 1 ([facility])   . Depression   . IBS (irritable bowel syndrome)   . HTN (hypertension)   . Bipolar 1 disorder ([facility])   . Abnormality of gait 11/07/2011  . Muscle weakness (generalized) 11/07/2011    Eulis Fosterheryl Sparkle Aube, PT 05/10/2015 4:18 PM   [facility] [address], [address] [place], [place], [address] Phone: [phone]   Fax:  [phone]  Name: [name] MRN: [id number] Date of Birth: 06/20/1955

## 2015-05-10 NOTE — Telephone Encounter (Signed)
Called pt and advised her per Dr Gherghe's message. Pt voiced understanding.  

## 2015-05-10 NOTE — Telephone Encounter (Signed)
I called patient back and let her know that Dr, Lolly MustacheArfeen said for her to talk to PCP about putting her on something that will not react. I let patient know that Dr. Lolly MustacheArfeen feels that the prozac is working and is reluctant to change therapy

## 2015-05-10 NOTE — Telephone Encounter (Signed)
She may need to increase mealtime insulin by 2 units.

## 2015-05-12 ENCOUNTER — Telehealth: Payer: Self-pay | Admitting: Internal Medicine

## 2015-05-12 NOTE — Telephone Encounter (Signed)
PT called in and said that she has been having a lot of physical health problems causing her to not be able to eat because she has just been in so much pain and that because of this, her sugars have been very off.  She wants to know if there is something she can take to help with this to maintain her sugars or if Dr. Elvera LennoxGherghe has any advice for her.

## 2015-05-12 NOTE — Telephone Encounter (Signed)
She can adjust her insulin doses before meals or even increase her basal insulin by few units. Unfortunately, I cannot make other adjustments without further information. She does have an appointment in about 10 days from now and we'll need to discuss further at that time.

## 2015-05-12 NOTE — Telephone Encounter (Signed)
Please read message below and advise.  

## 2015-05-13 NOTE — Telephone Encounter (Signed)
Called pt and advised her per Dr Gherghe's message. Pt voiced understanding.  

## 2015-05-17 ENCOUNTER — Ambulatory Visit (INDEPENDENT_AMBULATORY_CARE_PROVIDER_SITE_OTHER): Admitting: Psychiatry

## 2015-05-17 ENCOUNTER — Encounter (HOSPITAL_COMMUNITY): Payer: Self-pay | Admitting: Psychiatry

## 2015-05-17 ENCOUNTER — Other Ambulatory Visit (HOSPITAL_COMMUNITY): Payer: Self-pay

## 2015-05-17 VITALS — BP 146/80 | HR 98 | Ht 64.0 in | Wt 170.8 lb

## 2015-05-17 DIAGNOSIS — F419 Anxiety disorder, unspecified: Secondary | ICD-10-CM

## 2015-05-17 DIAGNOSIS — F3131 Bipolar disorder, current episode depressed, mild: Secondary | ICD-10-CM

## 2015-05-17 MED ORDER — FLUOXETINE HCL 40 MG PO CAPS: 40.0000 mg | ORAL_CAPSULE | Freq: Every day | ORAL | Status: DC

## 2015-05-17 MED ORDER — LAMOTRIGINE 200 MG PO TABS: 200.0000 mg | ORAL_TABLET | Freq: Every day | ORAL | Status: DC

## 2015-05-17 MED ORDER — CLONAZEPAM 1 MG PO TABS: ORAL_TABLET | ORAL | Status: DC

## 2015-05-17 MED ORDER — CLONAZEPAM 1 MG PO TABS
ORAL_TABLET | ORAL | Status: DC
Start: 2015-05-17 — End: 2015-06-29

## 2015-05-17 NOTE — Progress Notes (Signed)
[facility] [address] Progress Note  [name] [id number] 60 y.o.  05/17/2015 3:33 PM  Chief Complaint:  Medication management and follow-up.       History of Present Illness:  [name] came for her follow-up appointment.  She is going for physical therapy on a regular basis for her fibromyalgia and chronic pain.  She has seen a huge improvement in her pain.  Recently her physician recommended to try Nucenta because she was complaining of constipation with hydrocodone.  She still have constipation but she is having physical therapy and gastric massage which helps some time.  She is happy her daughter is doing very well.  Patient denies any paranoia or any hallucination.  She believe her depression is under control.  She denies any major panic attack.  She really likes her Klonopin and sometimes she does not take it because she feels relaxed.  She wants to continue Prozac Lamictal and Klonopin.  She has no tremors shakes or any side effects.  Her sleep is good.  Appetite is okay.  Her vitals are stable.  She lives with her husband.  She denies any major panic attack.  She denies any rash or itching with Lamictal.  She is not drinking or using any illegal substances.  Suicidal Ideation: No Plan Formed: No Patient has means to carry out plan: No  Homicidal Ideation: No Plan Formed: No Patient has means to carry out plan: No  Past Psychiatric History/Hospitalization(s): Patient  started seeing psychiatrist  Dr. Elna [name] at age 16 because of poor impulse control and irritability.  She then seen Dr. Tomasa [name] for past 15 years.  In the past she had tried lithium but she developed 2 times lithium toxicity and she is very afraid of lithium.  She had tried Seroquel, Abilify, Depakote, Lexapro, Zoloft, Paxil, Ativan and Risperdal. Recently we tried trazodone but she did not like side effects. Patient endorse history of mania and severe mood swings.  She has one psychiatric hospitalization  many years ago due to manic episode.  She denies any history of suicidal attempt. Anxiety: Yes Bipolar Disorder: Yes Depression: Yes Mania: Yes Psychosis: No Schizophrenia: No Personality Disorder: No Hospitalization for psychiatric illness: Yes History of Electroconvulsive Shock Therapy: No Prior Suicide Attempts: No  Medical History; Patient has diabetes mellitus with neuropathy, IBS, hypertension, hyperlipidemia. Her primary care physician is [name] at [place].  Patient denies any history of traumatic brain injury, seizures or headaches.  Family History; Patient endorse her mother has mania.  Her aunt has psychiatric illness however she does not know if to take any medication.  Review of Systems  Musculoskeletal: Positive for joint pain.  Skin: Negative for itching and rash.  Neurological:       Nephropathy  Psychiatric/Behavioral:       Forgetful and memory impairment    Psychiatric: Agitation: No Hallucination: No Depressed Mood: No Insomnia: No Hypersomnia: No Altered Concentration: No Feels Worthless: No Grandiose Ideas: No Belief In Special Powers: No New/Increased Substance Abuse: No Compulsions: No  Neurologic: Headache: No Seizure: No Paresthesias: Patient has neuropathy pain   Outpatient Encounter Prescriptions as of 05/17/2015  Medication Sig  . AMBULATORY NON FORMULARY MEDICATION Squatty Potty x 1  . amLODipine (NORVASC) 5 MG tablet Take 5 mg by mouth daily.  . BD PEN NEEDLE NANO U/F 32G X 4 MM MISC   . BENICAR 40 MG tablet   . clonazePAM (KLONOPIN) 1 MG tablet Take 1/2 to 1 tab twice a day  as needed for anxiety  . FLUoxetine (PROZAC) 40 MG capsule Take 1 capsule (40 mg total) by mouth daily.  Marland Kitchen gabapentin (NEURONTIN) 800 MG tablet Take 800 mg by mouth. 1 tablet in the morning and 2 tablets at bedtime.  Marland Kitchen glucose blood (FREESTYLE LITE) test strip Check sugars 4x a day. Dx code: E13.9  . insulin aspart (NOVOLOG FLEXPEN) 100 UNIT/ML FlexPen  Inject 10-15 Units into the skin 3 (three) times daily with meals.  . Insulin Glargine (LANTUS SOLOSTAR) 100 UNIT/ML Solostar Pen Inject 10 units under skin in am and 30 units at bedtime.  . lamoTRIgine (LAMICTAL) 200 MG tablet Take 1 tablet (200 mg total) by mouth daily.  . Lancets (FREESTYLE) lancets Use to test blood sugar 4 to 6 times daily as instructed.  . linaclotide (LINZESS) 72 MCG capsule Take 1 capsule (72 mcg total) by mouth daily before breakfast.  . lubiprostone (AMITIZA) 24 MCG capsule Take 1 capsule (24 mcg total) by mouth daily with breakfast.  . metFORMIN (GLUCOPHAGE) 500 MG tablet Take 1 tablet (500 mg total) by mouth 2 (two) times daily with a meal.  . omeprazole (PRILOSEC) 40 MG capsule Take 1 capsule (40 mg total) by mouth 2 (two) times daily.  . ondansetron (ZOFRAN ODT) 4 MG disintegrating tablet Take 1 tablet (4 mg total) by mouth 3 (three) times daily with meals.  . [DISCONTINUED] clonazePAM (KLONOPIN) 1 MG tablet Take 1/2 to 1 tab twice a day as needed for anxiety  . [DISCONTINUED] FLUoxetine (PROZAC) 40 MG capsule Take 1 capsule (40 mg total) by mouth daily.  . [DISCONTINUED] lamoTRIgine (LAMICTAL) 200 MG tablet Take 1 tablet (200 mg total) by mouth daily.  [name] ER 200 MG TB12    No facility-administered encounter medications on file as of 05/17/2015.    Recent Results (from the past 2160 hour(s))  Fructosamine     Status: Abnormal   Collection Time: 02/23/15 11:51 AM  Result Value Ref Range   Fructosamine 334 (H) 190 - 270 umol/L      Constitutional:  BP 146/80 mmHg  Pulse 98  Ht  (1.626 m)  Wt 170 lb 12.8 oz (77.474 kg)  BMI 29.30 kg/m2   Musculoskeletal: Strength & Muscle Tone: within normal limits Gait & Station: normal Patient leans: N/A  Psychiatric Specialty Exam: General Appearance: Casual  Eye Contact::  Fair  Speech:  Normal Rate  Volume:  Normal  Mood:  Anxious  Affect:  Labile  Thought Process:  Logical  Orientation:  Full  (Time, Place, and Person)  Thought Content:  WDL  Suicidal Thoughts:  No  Homicidal Thoughts:  No  Memory:  Immediate;   Fair Recent;   Fair Remote;   Poor  Judgement:  Intact  Insight:  Fair  Psychomotor Activity:  Normal  Concentration:  Fair  Recall:  [employer] of Knowledge:  Fair  Language:  Fair  Akathisia:  No  Handed:  Right  AIMS (if indicated):     Assets:  Communication Skills Desire for Improvement Financial Resources/Insurance Housing Social Support  ADL's:  Intact  Cognition:  WNL  Sleep:        Established Problem, Stable/Improving (1), Review of Psycho-Social Stressors (1), Review of Last Therapy Session (1) and Review of Medication Regimen & Side Effects (2)  Assessment: Axis I: Bipolar disorder mildly depressed, anxiety disorder NOS  Axis II: Deferred  Axis III:  Past Medical History  Diagnosis Date  . Diabetes mellitus without complication (HCC)   .  Depression   . IBS (irritable bowel syndrome)   . HTN (hypertension)   . Bipolar 1 disorder (HCC)   . Hyperlipidemia   . Breast density 06/25/2012    Right breast density, will get mammogram and US  . Duodenitis     Plan:  Patient Is a stable on her current psychiatric medication.  She has no tremors, shakes, rash or any itching.  I will continue Lamictal 200 mg daily, Prozac 40 mg daily and Klonopin 1 mg twice a day.  Discussed medication side effects and benefits.  She's also taking gabapentin 800 mg 3 times a day prescribed by primary care physician.  Discussed benzodiazepine dependence tolerance and withdrawal .  Follow-up in 3 months.  Recommended to call us back if she has any question or any concern.  Marcanthony Sleight T., MD 05/17/2015

## 2015-05-17 NOTE — Telephone Encounter (Signed)
Patient gave me her prescription of Klonopin and asked that I call it into [facility] - I called and they prefer a fax, I had already voided the Rx so I reprinted in order to fax in for the patient

## 2015-05-19 ENCOUNTER — Encounter: Payer: Self-pay | Admitting: Physical Therapy

## 2015-05-24 ENCOUNTER — Encounter: Payer: Self-pay | Admitting: Internal Medicine

## 2015-05-24 ENCOUNTER — Ambulatory Visit (INDEPENDENT_AMBULATORY_CARE_PROVIDER_SITE_OTHER): Admitting: Internal Medicine

## 2015-05-24 VITALS — BP 130/80 | HR 110 | Temp 97.8°F | Resp 12 | Wt 173.6 lb

## 2015-05-24 DIAGNOSIS — E139 Other specified diabetes mellitus without complications: Secondary | ICD-10-CM | POA: Diagnosis not present

## 2015-05-24 MED ORDER — INSULIN GLARGINE 100 UNIT/ML SOLOSTAR PEN: PEN_INJECTOR | SUBCUTANEOUS | Status: DC

## 2015-05-24 NOTE — Progress Notes (Signed)
Patient ID: [name], female   DOB: 1955/07/12, 60 y.o.   MRN: [id number]  HPI: [name] is a 60 y.o.-year-old female, initially referred by her PCP, Dr. Sharilyn [name] (PA: Collene Mares), returning for f/u for LADA, dx 2005, insulin-dependent since ~2006, controlled, with complications (PN, gastroparesis?). Last visit 3 mo ago.  She still has problems with IBS and fibromyalgia.  Previous fructosamine with corresponding HbA1c levels: Office Visit on 11/23/2014  Component Date Value Ref Range HbA1c - calculated   F Fructosamine 02/23/2015  334* 190 - 270 umol/L 7.28%         . Fructosamine 11/23/2014 332* 190 - 270 umol/L 7.25%   . Fructosamine 09/01/2014 328* 190 - 270 umol/L 7.18%   . Fructosamine 04/20/2014 388* 190 - 270 umol/L 8.2%.   . Fructosamine 10/29/2013 418* 190 - 270 umol/L 8.7%    Lab Results  Component Value Date   HGBA1C 4.6 07/16/2013   HGBA1C sent to [facility] 07/16/2013   HGBA1C 4.8 04/15/2012  - 02/12/2013: 4.2% - 12/19/2012: 5.2% - 09/18/2012: 6.5%  She is now on:  - Metformin 500 mg 2x a day  - Lantus 32 >> 15 units in am and 25 units at bedtime >> 10 units in am and 34 units at bedtime. - NovoLog before a meal as follows:  - 11 units before a small meal - 12 units before a regular meal  - 13 units before a large meal  - Sliding scale of NovoLog: - 150-200: + 1 unit  - 201-250: + 2 units  - 251-300: + 3 units  - >300: + 4 units   Pt checks her sugars 4-6 a day - per log review - no pattern: - am: 100-222 >> 64-140, 180, 228 >> 57x1, 112-182, 208, 325 >>  51x1, 130-168, 208 >> 87-227 - 2h after b'fast: 89-131 >> 124-239 >> 159-253 >> 63-109, 268 >> 101-208 >> 124, 194, 236 >> 59, 108-182, 237 - before lunch: 107-150, 190, 253 >> 55, 69-163, 190, 270, 273 >> 44, 52-156 >> 50, 58-169 >> 34, 45, 59-189, 182 - 2h after lunch: 74-110, 198 >> 156, 159 >> 140-218 >> 68-155 >> 48, 297 >> 38, 92-150 - before dinner: 72, 130-290 >> 40, 45, 100-209,  273 >> 98-173, 200 >> 83-169, 221, 274 >> 60, 116-302 - 2h after dinner: 117 >> n/c >> 142 >> 101, 149 >> 118, 208, 271 >> 160 >> 146 >> 178 >> 38, 50-194 - bedtime: 83-203 >> 58, 81-200, 210, 251 >> 68, 99-231 >> 119-162 - at night: 37 x1, 110 >> n/c >> 108, 228 >> n/c >> 41 x1, 75-202 >> 68-143, 172, 206, 298 >> 50, 57-244, 302 Has lows. Lowest sugar was 51 >> 34 and 38; she has hypoglycemia awareness at 50. Highest sugar was 347 >> 302  Pt's meals are: - Breakfast: toast, bowl of cereal + 2% milk  - Lunch: soup, sometimes sandwich, vegetables - Dinner: chicken/pork/beef + vegetable, no starch - Snacks: sugar free sweets Had DM education in 2012. Her insurance does not pay for nutrition or DM edu classes.  - no CKD, last BUN/creatinine:  Received labs from PCP, from 08/31/2014: - CMP normal, except glucose 194, albumin 5.1 (3.6-4.8). BUN/creatinine 13/0.63, EGFR 97. Previously: Lab Results  Component Value Date   BUN 16 02/24/2014   CREATININE 0.71 02/24/2014  On Losartan. - last set of lipids:  Lab Results  Component Value Date   CHOL 161 09/01/2014   HDL  45.40 09/01/2014   LDLCALC 98 09/01/2014   TRIG 85.0 09/01/2014   CHOLHDL 4 09/01/2014  161/92/46/97 in 03/18/2013.  Not on a statin.  - last eye exam was in [place]. No DR. + small cataract. Office Visit on 02/23/2015  Component Date Value Ref Range Status  . HM Diabetic Eye Exam 12/15/2014 No Retinopathy  No Retinopathy Final   - + numbness and tingling in her feet. On Neurontin.  She is seeing a podiatrist [facility]).  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 She recently stopped [place]. Also: GERD, HTN, HL, Fibromyalgia >> on Gabapentin.  I reviewed pt's medications, allergies, PMH, social hx, family hx, and changes were documented in the history of present illness. Otherwise, unchanged from my initial visit note.    ROS: Constitutional: + weight gain, + fatigue, + hot flushes, + nocturia Eyes: no blurry vision, no xerophthalmia ENT: no sore throat, no nodules palpated in throat, + dysphagia/no odynophagia, no hoarseness Cardiovascular: no CP/SOB/palpitations/no leg swelling Respiratory: no cough/SOB Gastrointestinal: no N/V/D/+ C/no heartburn Musculoskeletal: + muscle/no joint aches Skin: no rashes,+ hair loss Neurological: + tremors/no numbness/tingling/dizziness, + HA  PE: BP 130/80 mmHg  Pulse 110  Temp(Src) 97.8 F (36.6 C) (Oral)  Resp 12  Wt 173 lb 9.6 oz (78.744 kg)  SpO2 95% Body mass index is 29.78 kg/(m^2).  Wt Readings from Last 3 Encounters:  05/24/15 173 lb 9.6 oz (78.744 kg)  05/17/15 170 lb 12.8 oz (77.474 kg)  03/08/15 172 lb 6.4 oz (78.2 kg)   Constitutional: overweight, in distress b/c feeling nauseated Eyes: PERRLA, EOMI, no exophthalmos ENT: moist mucous membranes, no thyromegaly, no cervical lymphadenopathy Cardiovascular: Tachycardia, RR, No MRG Respiratory: CTA B Gastrointestinal: abdomen soft, NT, ND, BS+ Musculoskeletal: no deformities, strength intact in all 4 Skin: moist, warm, no rashes Neurological: no tremor with outstretched hands, DTR normal in all 4  ASSESSMENT: 1. LADA, insulin-dependent, uncontrolled, with complications - peripheral neuropathy - gastroparesis?  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 She still has a positive C peptide >> still has insulin secretion. For this reason, we continued the Metformin for now. Welchol was started by PCP for cholesterol lowering, so we continued this, also.  Labs from 11/17/2013: - ACR 29.4 - CBC with differential normal,  except eosinophiles 6% (0-5) - CMP normal, with a BUN/creatinine ratio of 13/0.55, random glucose 73, GFR >89 - Lipids: 161/92/46/97 - TSH 1.918 - Urinalysis normal, without ketones or glucose  Insurance does not cover nutrition visits...  PLAN:  1. Patient with LADA, on basal-bolus + oral antidiabetic regimen, with still very fluctuating sugars. Sugars ~same as before, but more lows >> will stop am Lantus. - I suggested an insulin pump >> discussed about them >> will also refer to DM edu for more info about this. She will check with her insurance if they are covered -  I suggested to:  Patient Instructions  Please stop Lantus in am and continue only 34 units at bedtime.  Please continue: -  Metformin 500 mg 2x a day  - NovoLog before a meal as follows:  - 11 units before a small meal - 12 units before a regular meal - 13 units before a large meal  - Sliding scale of NovoLog: - 150-200: + 1 unit  - 201-250: + 2 units  - 251-300: + 3 units  - >300: + 4 units  For a low blood sugars >> take 4 glucose tablets. If sugars <40, you can take 8 tablets.  Please schedule an appt with Leonia Reader for more information about insulin pumps.  Please return in 3 months with your sugar log.   - continue checking sugars at different times of the day - check 3-4 times a day, rotating checks - UTD with eye exams - will check a fructosamine today - Return to clinic in 3 mo with sugar log   Component     Latest Ref Rng 05/24/2015  Fructosamine     0 - 285 umol/L 312 (H)  HbA1c calculated from fructosamine is 6.9%, excellent.

## 2015-05-24 NOTE — Patient Instructions (Addendum)
Please stop Lantus in am and continue only 34 units at bedtime.  Please continue: - Metformin 500 mg 2x a day  - NovoLog before a meal as follows:  - 11 units before a small meal - 12 units before a regular meal - 13 units before a large meal  - Sliding scale of NovoLog: - 150-200: + 1 unit  - 201-250: + 2 units  - 251-300: + 3 units  - >300: + 4 units  For a low blood sugars >> take 4 glucose tablets. If sugars <40, you can take 8 tablets.  Please schedule an appt with Cristy FolksLinda Spagnola for more information about insulin pumps.  Please return in 3 months with your sugar log.

## 2015-05-25 LAB — FRUCTOSAMINE: Fructosamine: 312 umol/L — ABNORMAL HIGH (ref 0–285)

## 2015-05-26 ENCOUNTER — Ambulatory Visit: Admitting: Physical Therapy

## 2015-05-26 ENCOUNTER — Encounter: Payer: Self-pay | Admitting: Physical Therapy

## 2015-05-26 DIAGNOSIS — M6281 Muscle weakness (generalized): Secondary | ICD-10-CM | POA: Diagnosis not present

## 2015-05-26 DIAGNOSIS — R279 Unspecified lack of coordination: Secondary | ICD-10-CM

## 2015-05-26 NOTE — Patient Instructions (Signed)
FLEXION: Sitting (Active)    Sit, both feet flat. Lift right knee toward ceiling.  Complete _3__ sets of _10__ repetitions. Perform _1__ sessions per day. Tighten the abdominal Copyright  VHI. All rights reserved.  EXTENSION: Sitting (Active)    Sit with feet flat. Straighten right knee.  Complete _3__ sets of _10__ repetitions. Perform _1__ sessions per day.  http://gtsc.exer.us/268   Copyright  VHI. All rights reserved.  ANKLE: Plantarflexion - Sitting    Sit at edge of surface, feet on floor. Raise heels up as high as possible. _10__ reps per set, _3__ sets per day, 1___ days per week  Copyright  VHI. All rights reserved.  [facility] [address], [address] [place], [place] [address] Phone # [phone] Fax [phone]

## 2015-05-26 NOTE — Therapy (Signed)
[facility] [address], [address] [place], [place], [address] Phone: [phone]   Fax:  [phone]  Physical Therapy Treatment  Patient Details  Name: Sabrina ComptonBarbara L [name] MRN: [id number] Date of Birth: 02/10/1955 Referring Provider: Dr. Marsa ArisKavitha Nandigam  Encounter Date: 05/26/2015      PT End of Session - 05/26/15 1410    Visit Number 6   Number of Visits 10   Date for PT Re-Evaluation 07/14/15   Authorization Type tricare/coinsurance 24 limit visit within 60 days   Authorization - Visit Number 6   Authorization - Number of Visits 24   PT Start Time 1400   PT Stop Time 1440   PT Time Calculation (min) 40 min   Activity Tolerance Patient tolerated treatment well   Behavior During Therapy [facility] for tasks assessed/performed      Past Medical History  Diagnosis Date  . Diabetes mellitus without complication (HCC)   . Depression   . IBS (irritable bowel syndrome)   . HTN (hypertension)   . Bipolar 1 disorder (HCC)   . Hyperlipidemia   . Breast density 06/25/2012    Right breast density, will get mammogram and US  . Duodenitis     Past Surgical History  Procedure Laterality Date  . Cholecystectomy    . Abdominal hysterectomy    . Colonoscopy      There were no vitals filed for this visit.      Subjective Assessment - 05/26/15 1404    Subjective I was struggling with my depression. I went to the bathroom 2 times with solid bowels and had to strain a little.    Patient Stated Goals improve consistent bowel movements.    Currently in Pain? No/denies                         [facility]/Exercise - 05/26/15 0001    Lumbar Exercises: Aerobic   Elliptical 1 min level 1   Lumbar Exercises: Seated   Long Arc Quad on Chair Strengthening;Right;Left;3 sets;10 reps   Hip Flexion on Ball Limitations on chair 15x2 no weight with abdominal bracing   Manual Therapy   Manual Therapy Soft tissue  mobilization;Myofascial release   Soft tissue mobilization to abdominal muscles to promote bowel movements   Myofascial Release release to upper abdominal scar on the right, fascial release above the bladder to th sac of douglas ot release the fascia, myofascial release to left lower abdominal area lifting the lower intesting  with left hip flexed                PT Education - 05/26/15 1440    Education provided Yes   Education Details leg exercises in chair with abdominal bracing   Person(s) Educated Patient   Methods Explanation;Demonstration;Verbal cues;Handout   Comprehension Returned demonstration;Verbalized understanding          PT Short Term Goals - 05/26/15 1406    PT SHORT TERM GOAL #1   Title understand correct toileting technique to have a bowel movement   Time 4   Period Weeks   Status Achieved   PT SHORT TERM GOAL #2   Title independent with initial flexibility exercises   Time 4   Period Weeks   Status Achieved           PT Long Term Goals - 05/26/15 1409    PT LONG TERM GOAL #1   Title independent with HEP  Time 8   Period Weeks   Status On-going  still learning   PT LONG TERM GOAL #2   Title ability to have a bowel movement without straining due to relaxation of pelvic floor   Time 8   Period Weeks   Status On-going  30% better   PT LONG TERM GOAL #3   Title ability to contract the internal anal sphinter and puborectalis to push the bowel movement out   Time 8   Period Weeks   Status On-going   PT LONG TERM GOAL #4   Title pelvic floor strength is 4/5   Time 8   Period Weeks   Status On-going   PT LONG TERM GOAL #5   Title abdominal strength is 4/5 so patient is able to contract lower abdominals to assist in pushing the bowels out   Time 8   Period Weeks   Status On-going   PT LONG TERM GOAL #6   Title abdominal pain and bloating decreased >/= 50%   Time 8   Period Weeks   Status On-going  40% better                Plan - 05/26/15 1441    Clinical Impression Statement Patient is a 60 year old female with diagnosis of constipation and abdominal weakness. Patient reports no abdominal pain lately.  Today patient was able to have 2 bowel movements that were solid for first time. Patient still strains but improved by 30%.  Patient will benefit from physical therapy to relax muscles to not strain for boewl movements.    Rehab Potential Excellent   Clinical Impairments Affecting Rehab Potential None   PT Frequency 1x / week   PT Duration 8 weeks   PT Treatment/Interventions ADLs/Self Care Home Management;Biofeedback;Cryotherapy;Electrical Stimulation;Ultrasound;Moist Heat;Functional mobility training;Therapeutic activities;Therapeutic exercise;Patient/family education;Neuromuscular re-education;Manual techniques;Passive range of motion   PT Next Visit Plan cardio,exercise in chair,soft tissue work   PT Home Exercise Plan progress as needed   Consulted and Agree with Plan of Care Patient      Patient will benefit from skilled therapeutic intervention in order to improve the following deficits and impairments:  Increased fascial restricitons, Impaired sensation, Decreased coordination, Hypomobility, Decreased activity tolerance, Decreased endurance, Impaired flexibility, Decreased strength  Visit Diagnosis: Muscle weakness (generalized)  Unspecified lack of coordination     Problem List Patient Active Problem List   Diagnosis Date Noted  . Breast density 06/25/2012  . Encephalopathy, toxic 04/15/2012  . Lithium toxicity 04/15/2012  . Suicide ideation 04/15/2012  . LADA (latent autoimmune diabetes in adults), managed as type 1 (HCC)   . Depression   . IBS (irritable bowel syndrome)   . HTN (hypertension)   . Bipolar 1 disorder (HCC)   . Abnormality of gait 11/07/2011  . Muscle weakness (generalized) 11/07/2011    Eulis Foster, PT 05/26/2015 2:45 PM   [facility] [address], [address] [place], [place], [address] Phone: [phone]   Fax:  [phone]  Name: [name] MRN: [id number] Date of Birth: 1955-04-28

## 2015-06-02 ENCOUNTER — Telehealth: Payer: Self-pay | Admitting: Gastroenterology

## 2015-06-02 ENCOUNTER — Encounter: Payer: Self-pay | Admitting: Gastroenterology

## 2015-06-02 ENCOUNTER — Ambulatory Visit (INDEPENDENT_AMBULATORY_CARE_PROVIDER_SITE_OTHER): Payer: TRICARE For Life (TFL) | Admitting: Gastroenterology

## 2015-06-02 VITALS — BP 140/80 | HR 88 | Ht 64.0 in | Wt 175.4 lb

## 2015-06-02 DIAGNOSIS — K589 Irritable bowel syndrome without diarrhea: Secondary | ICD-10-CM | POA: Diagnosis not present

## 2015-06-02 DIAGNOSIS — K5902 Outlet dysfunction constipation: Secondary | ICD-10-CM | POA: Diagnosis not present

## 2015-06-02 MED ORDER — LINACLOTIDE 72 MCG PO CAPS: 72.0000 ug | ORAL_CAPSULE | Freq: Every day | ORAL | Status: DC

## 2015-06-02 NOTE — Patient Instructions (Addendum)
We are giving you Linzess samples today, Take 1 capsule every other day in the AM on empty stomach, if diarrhea starts discontinue  Hold Amitiza  We sent your 90 day supply to of Linzess to [facility]  Use Abdominal binder/ back support harness, ask your pharmacy  Follow up in 4-6 months

## 2015-06-02 NOTE — Telephone Encounter (Signed)
Linzess directions reviewed with pt.

## 2015-06-03 ENCOUNTER — Telehealth (HOSPITAL_COMMUNITY): Payer: Self-pay

## 2015-06-03 NOTE — Telephone Encounter (Signed)
Patient called this morning very tearful and upset. She says that she has a lot of medical things going on with her, they are going to start her on insulin and she is very upset. She is asking for something to help her get through the next few weeks. Patient is already taking Klonopin 0.5 mg 1/2 to 1 a day. Please review and advise, thank you.

## 2015-06-05 NOTE — Telephone Encounter (Signed)
She can take vistaril 25 mg twice a day as needed.

## 2015-06-06 ENCOUNTER — Telehealth: Payer: Self-pay | Admitting: Internal Medicine

## 2015-06-06 ENCOUNTER — Ambulatory Visit: Attending: Gastroenterology | Admitting: Physical Therapy

## 2015-06-06 ENCOUNTER — Encounter: Payer: Self-pay | Admitting: Physical Therapy

## 2015-06-06 DIAGNOSIS — M6281 Muscle weakness (generalized): Secondary | ICD-10-CM | POA: Diagnosis not present

## 2015-06-06 DIAGNOSIS — R279 Unspecified lack of coordination: Secondary | ICD-10-CM | POA: Diagnosis present

## 2015-06-06 NOTE — Telephone Encounter (Signed)
Patient ask if Dr Elvera LennoxGherghe want her to set up an appointment with linda, please advise

## 2015-06-06 NOTE — Telephone Encounter (Signed)
It's in. Look under referrals.

## 2015-06-06 NOTE — Telephone Encounter (Signed)
Elease Hashimotoatricia, will you please call Ms Corky DownsGoke and schedule this? I do not have access to Linda's calendar to schedule patients. Thank you!

## 2015-06-06 NOTE — Therapy (Addendum)
[facility] [address], [place], [place], [address] Phone: [phone]   Fax:  [phone]  Physical Therapy Treatment  Patient Details  Name: [name] MRN: [id number] Date of Birth: 11/25/1955 Referring Provider: Dr. Harl Bowie  Encounter Date: 06/06/2015      PT End of Session - 06/06/15 1413    Visit Number 7   Number of Visits 10   Date for PT Re-Evaluation 07/14/15   Authorization Type tricare/coinsurance 24 limit visit within 60 days   Authorization - Visit Number 7   Authorization - Number of Visits 24   PT Start Time 1400   PT Stop Time 1441   PT Time Calculation (min) 41 min   Activity Tolerance Patient tolerated treatment well   Behavior During Therapy [facility] for tasks assessed/performed      Past Medical History  Diagnosis Date  . Diabetes mellitus without complication ([place])   . Depression   . IBS (irritable bowel syndrome)   . HTN (hypertension)   . Bipolar 1 disorder ([place])   . Hyperlipidemia   . Breast density 06/25/2012    Right breast density, will get mammogram and [place]  . Duodenitis     Past Surgical History  Procedure Laterality Date  . Cholecystectomy    . Abdominal hysterectomy    . Colonoscopy      There were no vitals filed for this visit.      Subjective Assessment - 06/06/15 1404    Subjective MD wants me to take Linzess every 3-4 days. I have to go on the diabetes pump. I had a bowel movement yesterday and today. I am getting a belt to hold the hernia. Patient has to hold her hand over her hernia when she is having a bowel movement. Abdominal pain and bloating is 50% better.    Patient Stated Goals improve consistent bowel movements.    Currently in Pain? No/denies       g-code: Functional assessment tool used FOTO score is 40% limitation; Functional limitation is Other PT primary; goal status is CJ and discharge status is CK. Earlie Counts, PT 11/22/15 8:11  AM                    [facility] Adult PT Treatment/Exercise - 06/06/15 0001    Lumbar Exercises: Aerobic   Stationary Bike level 1 6 min   Lumbar Exercises: Seated   Sit to Stand Limitations sit on green physioball pelvic sway, pelvis tilt, alternate hip flexion, alternate shoulder flexion   Manual Therapy   Manual Therapy Myofascial release   Myofascial Release release to upper abdominal scar on the right, fascial release above the bladder to th sac of douglas ot release the fascia, myofascial release to left lower abdominal area lifting the lower intesting  with left hip flexed                PT Education - 06/06/15 1439    Education provided No          PT Short Term Goals - 05/26/15 1406    PT SHORT TERM GOAL #1   Title understand correct toileting technique to have a bowel movement   Time 4   Period Weeks   Status Achieved   PT SHORT TERM GOAL #2   Title independent with initial flexibility exercises   Time 4   Period Weeks   Status Achieved  PT Long Term Goals - 06/06/15 1409    PT LONG TERM GOAL #1   Title independent with HEP   Time 8   Period Weeks   Status On-going   PT LONG TERM GOAL #2   Title ability to have a bowel movement without straining due to relaxation of pelvic floor   Time 8   Period Weeks   Status On-going  30% improvement   PT LONG TERM GOAL #3   Title ability to contract the internal anal sphinter and puborectalis to push the bowel movement out   Time 8   Period Weeks   Status On-going   PT LONG TERM GOAL #4   Title pelvic floor strength is 4/5   Time 8   Period Weeks   Status On-going   PT LONG TERM GOAL #5   Title abdominal strength is 4/5 so patient is able to contract lower abdominals to assist in pushing the bowels out   Time 8   Period Weeks   Status On-going   PT LONG TERM GOAL #6   Title abdominal pain and bloating decreased >/= 50%   Time 8   Period Weeks   Status Achieved                Plan - 06/06/15 1440    Clinical Impression Statement Patient is a 60 year old female with diagnosis of constipation and abdominal weakness.  Patient is taking the Linzess to assist in bowel movements.  Patient has met LTG #6.  Patient is exercising more to assist in feeling good and improve bowel movement.  Patient will be getting a hernia abdominal support and diabetic pump.  Patient will beenfit form physical therapy to decrease straining of bowel movement.    Rehab Potential Excellent   Clinical Impairments Affecting Rehab Potential None   PT Frequency 1x / week   PT Duration 8 weeks   PT Treatment/Interventions ADLs/Self Care Home Management;Biofeedback;Cryotherapy;Electrical Stimulation;Ultrasound;Moist Heat;Functional mobility training;Therapeutic activities;Therapeutic exercise;Patient/family education;Neuromuscular re-education;Manual techniques;Passive range of motion   PT Next Visit Plan cardio,exercise in on ball,soft tissue work on abdominal or pelvic floor; check abdominal binder   PT Home Exercise Plan progress as needed   Consulted and Agree with Plan of Care Patient      Patient will benefit from skilled therapeutic intervention in order to improve the following deficits and impairments:  Increased fascial restricitons, Impaired sensation, Decreased coordination, Hypomobility, Decreased activity tolerance, Decreased endurance, Impaired flexibility, Decreased strength  Visit Diagnosis: Muscle weakness (generalized)  Unspecified lack of coordination     Problem List Patient Active Problem List   Diagnosis Date Noted  . Breast density 06/25/2012  . Encephalopathy, toxic 04/15/2012  . Lithium toxicity 04/15/2012  . Suicide ideation 04/15/2012  . LADA (latent autoimmune diabetes in adults), managed as type 1 ([place])   . Depression   . IBS (irritable bowel syndrome)   . HTN (hypertension)   . Bipolar 1 disorder ([place])   . Abnormality of gait 11/07/2011   . Muscle weakness (generalized) 11/07/2011    Earlie Counts, PT 06/06/2015 2:44 PM    [facility] [address] [place], [place], [address] Phone: [phone]   Fax:  [phone]  Name: KOI YARBRO MRN: [id number] Date of Birth: [birth year]   PHYSICAL THERAPY DISCHARGE SUMMARY  Visits from Start of Care: 7  Current functional level related to goals / functional outcomes: See above. Did not return.   Remaining deficits: See above.  Education / Equipment: HEP Plan:                                                    Patient goals were not met. Patient is being discharged due to not returning since the last visit. Thank you for the referral. Earlie Counts, PT 11/22/15 8:13 AM   ?????

## 2015-06-06 NOTE — Telephone Encounter (Signed)
Please read message below. I believe pt would like for Dr Elvera LennoxGherghe to put [facility].

## 2015-06-07 ENCOUNTER — Telehealth: Payer: Self-pay | Admitting: Gastroenterology

## 2015-06-07 MED ORDER — HYDROXYZINE PAMOATE 25 MG PO CAPS: 25.0000 mg | ORAL_CAPSULE | Freq: Two times a day (BID) | ORAL | Status: DC | PRN

## 2015-06-07 NOTE — Telephone Encounter (Signed)
Patient reports her sx's are much improved with Linzess. She is being considered for an insulin pump. She will deal with that first. She is not getting the brace just yet.

## 2015-06-07 NOTE — Telephone Encounter (Signed)
[facility] and called patient to let her know

## 2015-06-07 NOTE — Telephone Encounter (Signed)
No answer. Left phone number to return call.

## 2015-06-08 ENCOUNTER — Telehealth (HOSPITAL_COMMUNITY): Payer: Self-pay

## 2015-06-08 ENCOUNTER — Telehealth: Payer: Self-pay | Admitting: Gastroenterology

## 2015-06-08 NOTE — Telephone Encounter (Signed)
Returned pt's call. Pt stated that her b/s has dropped to 282. She is nauseated. She has seen a GI Dr for her constipation and then it turned to diarrhea. She has not eated, advised her to eat something lite but to eat something since she took her meds 30 min ago. Please advise further.

## 2015-06-08 NOTE — Telephone Encounter (Signed)
If she cannot take anything by mouth, she may need to go to the emergency room. Otherwise, I would use low doses of mealtime insulin to bring the sugars down. And she can eat when the sugars drop under 200.

## 2015-06-08 NOTE — Telephone Encounter (Signed)
Patient is calling back stated she is not feeling well, she has question about her medication.

## 2015-06-08 NOTE — Telephone Encounter (Signed)
Previously on Amitiza. No answer. Did not leave a message because the patient told me yesterday she does not know how to access her voicemail.

## 2015-06-08 NOTE — Telephone Encounter (Signed)
Patient calling, she said that she took the vistaril and she slept all day, when she woke up this morning she was nauseous. Patient was very tearful and upset, she is having other issues with her healthcare right now. I s/w patient for awhile and got her calmed down some, I advised that she check her sugar and make sure she is drinking plenty of water. I told patient I would discuss the Vistaril with you and call her back and this seemed to make her feel better. Please review and advise, thank you

## 2015-06-08 NOTE — Telephone Encounter (Signed)
Pt is not feeling well and the BS reading this am was 301

## 2015-06-08 NOTE — Telephone Encounter (Signed)
Called pt and lvm advising her per Dr Charlean SanfilippoGherghe's message. Advised pt that Dr Elvera LennoxGherghe is gone for today but will return tomorrow.

## 2015-06-09 ENCOUNTER — Telehealth: Payer: Self-pay | Admitting: Gastroenterology

## 2015-06-09 NOTE — Telephone Encounter (Signed)
Patient encouraged to focus on her diabetes control. Her follow up appointment is in the fall. The "hernia belt" is for her comfort.

## 2015-06-09 NOTE — Telephone Encounter (Signed)
I called the patient this morning and she stated she is feeling a little better - she is not going to take any more of the vistaril. I talked to patient for a few minutes about her diabetes and her getting a pump put in - she is nervous about this - and she said that she felt better after talking to me. Patient did ask for an increase in the Klonopin to 3 x a day, but I explained that Dr. Lolly MustacheArfeen is not going to go up on that and that she could discuss with him at the next visit.

## 2015-06-09 NOTE — Telephone Encounter (Signed)
Spoke with the patient. She wants to take Linzess every 3rd day. If that doesn't work to her satisfaction, she will call back and discuss Amitiza.

## 2015-06-09 NOTE — Progress Notes (Signed)
Sabrina ComptonBarbara L [name]    [id number]    07/09/1955  Primary Care Physician:[name], Sabrina HurterJOHN CABOT, MD  Referring Physician: Assunta FoundJohn Golding, MD [address] [place], [place] [id number]  Chief complaint: Constipation   HPI: 60 year old female with history of fibromyalgia, irritable bowel syndrome predominant constipation here for follow-up visit. She is following up with [name] for pelvic floor dysfunction and has noticed significant improvement with physical therapy but continues to have constipation. She is currently taking Amitiza but does not feel that she is able to empty completely. Denies any nausea, vomiting, abdominal pain, melena or bright red blood per rectum    Outpatient Encounter Prescriptions as of 06/02/2015  Medication Sig  . AMBULATORY NON FORMULARY MEDICATION Squatty Potty x 1  . amLODipine (NORVASC) 5 MG tablet Take 5 mg by mouth daily.  . BD PEN NEEDLE NANO U/F 32G X 4 MM MISC   . BENICAR 40 MG tablet   . clonazePAM (KLONOPIN) 1 MG tablet Take 1/2 to 1 tab twice a day as needed for anxiety  . FLUoxetine (PROZAC) 40 MG capsule Take 1 capsule (40 mg total) by mouth daily.  Marland Kitchen. gabapentin (NEURONTIN) 800 MG tablet Take 800 mg by mouth. 1 tablet in the morning and 2 tablets at bedtime.  Marland Kitchen. glucose blood (FREESTYLE LITE) test strip Check sugars 4x a day. Dx code: E13.9  . insulin aspart (NOVOLOG FLEXPEN) 100 UNIT/ML FlexPen Inject 10-15 Units into the skin 3 (three) times daily with meals.  . Insulin Glargine (LANTUS SOLOSTAR) 100 UNIT/ML Solostar Pen Inject under skin 30 units at bedtime.  . lamoTRIgine (LAMICTAL) 200 MG tablet Take 1 tablet (200 mg total) by mouth daily.  . Lancets (FREESTYLE) lancets Use to test blood sugar 4 to 6 times daily as instructed.  . lubiprostone (AMITIZA) 24 MCG capsule Take 1 capsule (24 mcg total) by mouth daily with breakfast.  . metFORMIN (GLUCOPHAGE) 500 MG tablet Take 1 tablet (500 mg total) by mouth 2 (two) times daily with a  meal.  . NUCYNTA ER 200 MG TB12   . omeprazole (PRILOSEC) 40 MG capsule Take 1 capsule (40 mg total) by mouth 2 (two) times daily.  . ondansetron (ZOFRAN ODT) 4 MG disintegrating tablet Take 1 tablet (4 mg total) by mouth 3 (three) times daily with meals.  Marland Kitchen. linaclotide (LINZESS) 72 MCG capsule Take 1 capsule (72 mcg total) by mouth daily before breakfast.  . [DISCONTINUED] linaclotide (LINZESS) 72 MCG capsule Take 1 capsule (72 mcg total) by mouth daily before breakfast.  . [DISCONTINUED] linaclotide (LINZESS) 72 MCG capsule Take 1 capsule (72 mcg total) by mouth daily before breakfast.   No facility-administered encounter medications on file as of 06/02/2015.    Allergies as of 06/02/2015 - Review Complete 06/02/2015  Allergen Reaction Noted  . Reglan [metoclopramide] Other (See Comments) 03/23/2013    Past Medical History  Diagnosis Date  . Diabetes mellitus without complication ([facility])   . Depression   . IBS (irritable bowel syndrome)   . HTN (hypertension)   . Bipolar 1 disorder ([facility])   . Hyperlipidemia   . Breast density 06/25/2012    Right breast density, will get mammogram and US  . Duodenitis     Past Surgical History  Procedure Laterality Date  . Cholecystectomy    . Abdominal hysterectomy    . Colonoscopy      Family History  Problem Relation Age of Onset  . Hypertension Mother   .  Bipolar disorder Mother   . Heart disease Brother   . Thyroid disease Sister   . Hypertension Sister   . Bipolar disorder Sister   . Colon cancer Neg Hx   . Colon polyps Neg Hx   . Esophageal cancer Neg Hx   . Kidney disease Neg Hx   . Gallbladder disease Neg Hx   . Diabetes Neg Hx   . Bipolar disorder Maternal Aunt   . Depression Father   . Bipolar disorder Daughter     Social History   Social History  . Marital Status: Married    Spouse Name: N/A  . Number of Children: 1  . Years of Education: N/A   Occupational History  . Disabled    Social History Main Topics  .  Smoking status: Never Smoker   . Smokeless tobacco: Never Used  . Alcohol Use: No  . Drug Use: No  . Sexual Activity: Not Currently   Other Topics Concern  . Not on file   Social History Narrative      Review of systems: Review of Systems  Constitutional: Negative for fever and chills.  HENT: Negative.   Eyes: Negative for blurred vision.  Respiratory: Negative for cough, shortness of breath and wheezing.   Cardiovascular: Negative for chest pain and palpitations.  Gastrointestinal: as per HPI Genitourinary: Negative for dysuria, urgency, frequency and hematuria.  Musculoskeletal: Negative for myalgias, back pain and joint pain.  Skin: Negative for itching and rash.  Neurological: Negative for dizziness, tremors, focal weakness, seizures and loss of consciousness.  Endo/Heme/Allergies: Negative for environmental allergies.  Psychiatric/Behavioral: Negative for depression, suicidal ideas and hallucinations.  All other systems reviewed and are negative.   Physical Exam: Filed Vitals:   06/02/15 0844  BP: 140/80  Pulse: 88   Gen:      No acute distress HEENT:  EOMI, sclera anicteric Neck:     No masses; no thyromegaly Lungs:    Clear to auscultation bilaterally; normal respiratory effort CV:         Regular rate and rhythm; no murmurs Abd:      + bowel sounds; soft, non-tender; no palpable masses, +distension with no abdominal muscle tone and ventral hernia Ext:    No edema; adequate peripheral perfusion Skin:      Warm and dry; no rash Neuro: alert and oriented x 3 Psych: normal mood and affect  Data Reviewed: Reviewed chart in epic   Assessment and Plan/Recommendations:  60 year old female with history of fibromyalgia, irritable bowel syndrome predominant constipation here for follow-up visit Continue pelvic floor physical therapy Patient is concerned about the expense with Amitiza, has higher copay Patient did not tolerate linzess 145 g daily, had significant  diarrhea with it; she is willing to try low dose 72 g daily but if still has diarrhea will consider increasing Amitiza to twice daily Advised patient to use abdominal binder for ventral hernia She is past due for screening colonoscopy, will need to schedule   Kirtland Bouchard Scherry Ran , MD [phone] Mon-Fri 8a-5p [phone] after 5p, weekends, holidays  CC: Sabrina Found, MD

## 2015-06-13 ENCOUNTER — Encounter: Payer: Self-pay | Admitting: Physical Therapy

## 2015-06-13 ENCOUNTER — Telehealth: Payer: Self-pay | Admitting: Nutrition

## 2015-06-13 ENCOUNTER — Telehealth: Payer: Self-pay | Admitting: Internal Medicine

## 2015-06-13 NOTE — Telephone Encounter (Signed)
Patient is stressing over her appointment with you, thinking she is going to die if she is not put on the vgo pump. She stated that her insurance co [employer] stated she would need a letter sent with a CPT code and a PA to see if it is covered.  She has called like three times a day every week, stephanie and I have tried to help her she is not getting it, so stephanie ask to me to send you a message, maybe you can help her understand.

## 2015-06-13 NOTE — Telephone Encounter (Signed)
Husband would like for you to give him a call to discuss his wife's insulin pump. He stated his wife is very excitable and would like to talk to you himself.  Please call him at [phone].

## 2015-06-13 NOTE — Telephone Encounter (Signed)
Pt called and wanted to know whether the insulin pump was approved through her insurance or not.

## 2015-06-13 NOTE — Telephone Encounter (Signed)
[name], See below, have you talked with the pt regarding a insulin pump?

## 2015-06-14 NOTE — Telephone Encounter (Signed)
Husband says wife is bipolar and is very upset about the possibility of starting something new.  We discussed the procedure of examining the different pumps, making a decision as to which pump she wants, sending the form to the pump company to determine her cost, her decision to try this and what will happen after that.   Husband very appreciative of the information, saying the patient has tried to contact [employer] insurance company, and not know what to ask for, etc., and got very upset and confused.   Appt. Made for next week.  He said he would discuss all of this with his wife when she wakes up, and had no final questions.

## 2015-06-14 NOTE — Telephone Encounter (Signed)
See previous telephone encounter about speaking with husband.

## 2015-06-14 NOTE — Telephone Encounter (Signed)
Phone called answer

## 2015-06-21 ENCOUNTER — Encounter: Payer: TRICARE For Life (TFL) | Attending: Internal Medicine | Admitting: Nutrition

## 2015-06-22 ENCOUNTER — Ambulatory Visit (HOSPITAL_COMMUNITY): Payer: Self-pay | Admitting: Psychiatry

## 2015-06-23 ENCOUNTER — Telehealth: Payer: Self-pay | Admitting: Gastroenterology

## 2015-06-23 NOTE — Telephone Encounter (Signed)
She continues to have only liquid stool or nothing. She is using a once a week Fleet enema now. She says if she does not do this, she becomes bloated.  I reminded her she had previously called stating the Linzess caused diarrhea. She states she only has liquid stool.  Please advise.

## 2015-06-24 NOTE — Telephone Encounter (Signed)
Patient calls today and says she thinks her Linzess is working now! She asks the message be disregarded.

## 2015-06-29 ENCOUNTER — Ambulatory Visit (INDEPENDENT_AMBULATORY_CARE_PROVIDER_SITE_OTHER): Admitting: Psychiatry

## 2015-06-29 ENCOUNTER — Encounter (HOSPITAL_COMMUNITY): Payer: Self-pay | Admitting: Psychiatry

## 2015-06-29 VITALS — BP 150/90 | HR 96 | Ht 64.0 in | Wt 171.0 lb

## 2015-06-29 DIAGNOSIS — F419 Anxiety disorder, unspecified: Secondary | ICD-10-CM

## 2015-06-29 DIAGNOSIS — F3131 Bipolar disorder, current episode depressed, mild: Secondary | ICD-10-CM | POA: Diagnosis not present

## 2015-06-29 MED ORDER — FLUOXETINE HCL 40 MG PO CAPS: 40.0000 mg | ORAL_CAPSULE | Freq: Every day | ORAL | Status: DC

## 2015-06-29 MED ORDER — CLONAZEPAM 1 MG PO TABS: ORAL_TABLET | ORAL | Status: DC

## 2015-06-29 MED ORDER — LAMOTRIGINE 200 MG PO TABS: 200.0000 mg | ORAL_TABLET | Freq: Every day | ORAL | Status: DC

## 2015-06-29 NOTE — Progress Notes (Signed)
[facility] [id number] Progress Note  Sabrina ComptonBarbara L [name] [id number] 60 y.o.  06/29/2015 4:37 PM  Chief Complaint:  I am under a lot of stress.  My anxiety is very high.  I'm only sleeping 1-2 hours.  I'm having crying spells.         History of Present Illness:  Sabrina MccreedyBarbara came for her follow-up appointment.  She's been complaining of increased anxiety and nervousness.  She is very tearful and endorse multiple health issues including recently diabetes getting out of control .  Patient told now she has to take insulin pump which is very expensive and she is not sure how she is under 4.  She sleeping only 1-2 hours because she is worried about the finances.  She is also very upset because she saw a therapist a few times and she deceive $1000 bill from collection agency.  She was in her impression that therapist is covered from the insurance.  Patient started crying about her finances.  She is taking the medication but she feels is not helping her.  She stopped taking Vistaril because it does not , or noun.  She admitted irritability, panic attacks and feeling hopeless.  However she denies any active or passive suicidal thoughts.  She is compliant with Prozac, Lamictal and Klonopin 1 mg twice a day.  Patient denies drinking alcohol or using any illegal substances.  Her appetite is fair.  Her energy level is low.  She denies any paranoia or any hallucination.  She denies any aggressive or any self abusive behavior.  Patient denies any side effects.  Her vital signs are stable.    Suicidal Ideation: No Plan Formed: No Patient has means to carry out plan: No  Homicidal Ideation: No Plan Formed: No Patient has means to carry out plan: No  Past Psychiatric History/Hospitalization(s): Patient  started seeing psychiatrist  Dr. Elna Breslowoug Smith at age 60 because of poor impulse control and irritability.  She then seen Dr. Tomasa Randunningham for past 15 years.  In the past she had tried lithium but she developed 2 times  lithium toxicity and she is very afraid of lithium.  She had tried Seroquel, Abilify, Depakote, Lexapro, Zoloft, Paxil, Ativan and Risperdal. Recently we tried trazodone but she did not like side effects. Patient endorse history of mania and severe mood swings.  She has one psychiatric hospitalization many years ago due to manic episode.  She denies any history of suicidal attempt. Anxiety: Yes Bipolar Disorder: Yes Depression: Yes Mania: Yes Psychosis: No Schizophrenia: No Personality Disorder: No Hospitalization for psychiatric illness: Yes History of Electroconvulsive Shock Therapy: No Prior Suicide Attempts: No  Medical History; Patient has diabetes mellitus with neuropathy, IBS, hypertension, hyperlipidemia. Her primary care physician is Sharlet [facility]jamin [facility] at [facility].  Patient denies any history of traumatic brain injury, seizures or headaches.  Family History; Patient endorse her mother has mania.  Her aunt has psychiatric illness however she does not know if to take any medication.  Review of Systems  Musculoskeletal: Positive for joint pain.  Skin: Negative for itching and rash.  Neurological:       Nephropathy  Psychiatric/Behavioral: Positive for depression. The patient is nervous/anxious and has insomnia.        Forgetful and memory impairment    Psychiatric: Agitation: No Hallucination: No Depressed Mood: Yes Insomnia: Yes Hypersomnia: No Altered Concentration: No Feels Worthless: Yes Grandiose Ideas: No Belief In Special Powers: No New/Increased Substance Abuse: No Compulsions: No  Neurologic: Headache: No Seizure: No  Paresthesias: Patient has neuropathy pain   Outpatient Encounter Prescriptions as of 06/29/2015  Medication Sig  . AMBULATORY NON FORMULARY MEDICATION Squatty Potty x 1  . amLODipine (NORVASC) 5 MG tablet Take 5 mg by mouth daily.  . BD PEN NEEDLE NANO U/F 32G X 4 MM MISC   . BENICAR 40 MG tablet   . clonazePAM (KLONOPIN) 1 MG tablet Take 1 tab  three times a day as needed for anxiety  . FLUoxetine (PROZAC) 40 MG capsule Take 1 capsule (40 mg total) by mouth daily.  Marland Kitchen gabapentin (NEURONTIN) 800 MG tablet Take 800 mg by mouth. 1 tablet in the morning and 2 tablets at bedtime.  Marland Kitchen glucose blood (FREESTYLE LITE) test strip Check sugars 4x a day. Dx code: E13.9  . insulin aspart (NOVOLOG FLEXPEN) 100 UNIT/ML FlexPen Inject 10-15 Units into the skin 3 (three) times daily with meals.  . Insulin Glargine (LANTUS SOLOSTAR) 100 UNIT/ML Solostar Pen Inject under skin 30 units at bedtime.  . lamoTRIgine (LAMICTAL) 200 MG tablet Take 1 tablet (200 mg total) by mouth daily.  . Lancets (FREESTYLE) lancets Use to test blood sugar 4 to 6 times daily as instructed.  . linaclotide (LINZESS) 72 MCG capsule Take 1 capsule (72 mcg total) by mouth daily before breakfast.  . lubiprostone (AMITIZA) 24 MCG capsule Take 1 capsule (24 mcg total) by mouth daily with breakfast.  . metFORMIN (GLUCOPHAGE) 500 MG tablet Take 1 tablet (500 mg total) by mouth 2 (two) times daily with a meal.  . NUCYNTA ER 200 MG TB12   . omeprazole (PRILOSEC) 40 MG capsule Take 1 capsule (40 mg total) by mouth 2 (two) times daily.  . ondansetron (ZOFRAN ODT) 4 MG disintegrating tablet Take 1 tablet (4 mg total) by mouth 3 (three) times daily with meals.  . [DISCONTINUED] clonazePAM (KLONOPIN) 1 MG tablet Take 1/2 to 1 tab twice a day as needed for anxiety  . [DISCONTINUED] FLUoxetine (PROZAC) 40 MG capsule Take 1 capsule (40 mg total) by mouth daily.  . [DISCONTINUED] hydrOXYzine (VISTARIL) 25 MG capsule Take 1 capsule (25 mg total) by mouth 2 (two) times daily as needed.  . [DISCONTINUED] lamoTRIgine (LAMICTAL) 200 MG tablet Take 1 tablet (200 mg total) by mouth daily.   No facility-administered encounter medications on file as of 06/29/2015.    Recent Results (from the past 2160 hour(s))  Fructosamine     Status: Abnormal   Collection Time: 05/24/15 11:30 AM  Result Value Ref Range    Fructosamine 312 (H) 0 - 285 umol/L    Comment: Published reference interval for apparently healthy subjects between age 13 and 58 is 66 - 285 umol/L and in a poorly controlled diabetic population is 228 - 563 umol/L with a mean of 396 umol/L.       Constitutional:  BP 150/90 mmHg  Pulse 96  Ht  (1.626 m)  Wt 171 lb (77.565 kg)  BMI 29.34 kg/m2   Musculoskeletal: Strength & Muscle Tone: within normal limits Gait & Station: normal Patient leans: N/A  Psychiatric Specialty Exam: General Appearance: Casual  Eye Contact::  Fair  Speech:  Normal Rate  Volume:  Normal  Mood:  Anxious, Depressed and Dysphoric  Affect:  Labile  Thought Process:  Logical  Orientation:  Full (Time, Place, and Person)  Thought Content:  WDL  Suicidal Thoughts:  No  Homicidal Thoughts:  No  Memory:  Immediate;   Fair Recent;   Fair Remote;   Poor  Judgement:  Intact  Insight:  Fair  Psychomotor Activity:  Normal  Concentration:  Fair  Recall:  [employer]Fair  Fund of Knowledge:  Fair  Language:  Fair  Akathisia:  No  Handed:  Right  AIMS (if indicated):     Assets:  Communication Skills Desire for Improvement Financial Resources/Insurance Housing Social Support  ADL's:  Intact  Cognition:  WNL  Sleep:        Established Problem, Stable/Improving (1), Review of Psycho-Social Stressors (1), Review and summation of old records (2), Established Problem, Worsening (2), Review of Last Therapy Session (1), Review of Medication Regimen & Side Effects (2) and Review of New Medication or Change in Dosage (2)  Assessment: Axis I: Bipolar disorder mildly depressed, anxiety disorder NOS  Axis II: Deferred  Axis III:  Past Medical History  Diagnosis Date  . Diabetes mellitus without complication (HCC)   . Depression   . IBS (irritable bowel syndrome)   . HTN (hypertension)   . Bipolar 1 disorder (HCC)   . Hyperlipidemia   . Breast density 06/25/2012    Right breast density, will get  mammogram and US  . Duodenitis     Plan:  Patient Is Slowly getting decompensated.  She is more depressed and anxious.  She is having crying spells.  She is not interested in counseling because she cannot afford at this time.  I recommended to increase Klonopin 1 mg 3 times a day.  He used to take up to 4 mg Klonopin in the past which has been gradually reduced to twice a day.  However patient does not feel current Klonopin dose is helping her.  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  Continue Prozac 40 mg daily and Lamictal 200 mg daily.  She has no rash itching or any shakes. Discussed medication side effects and benefits.  She's also taking gabapentin 800 mg 3 times a day prescribed by primary care physician.  Follow-up in 3 months.  Recommended to call us back if she has any question or any concern.  Aneri Slagel T., MD 06/29/2015

## 2015-06-30 ENCOUNTER — Telehealth (HOSPITAL_COMMUNITY): Payer: Self-pay | Admitting: Psychiatry

## 2015-06-30 ENCOUNTER — Telehealth (HOSPITAL_COMMUNITY): Payer: Self-pay

## 2015-06-30 NOTE — Telephone Encounter (Signed)
Patient was seen yesterday and her Klonopin prescription was increased to three times a day. Patient asked me to call this in and I did. When I came in this morning she had left me 3 voicemail's last night saying I sent it to the wrong pharmacy, each call patient was getting more and more agitated. Pt. Stated in the voicemail that it was supposed to go to [facility]. I called them and asked them to send me a form to fax back because they will not do controlled substances over the phone. I then called the patient and apologized for my error and told her that it would be corrected before the end of the day. I received the fax and filled it out, it needed the Dr. Lenox PondsSignature and to be faxed back - this was right before lunch. When I came back from lunch, the patient had called 3 more times complaining about the prescription not being correct. Patient was NOT out of medication - she just received a [facility]. I called her back to let her know that I was working on it and she told me to neve rmind, she would just go pick it up at [facility] I apologized again and asked her for patience and she said that she was sick and tired of fighting for her medication and [facility].

## 2015-06-30 NOTE — Telephone Encounter (Signed)
I called her back and now she wants to [facility] because she also expressed concern [facility].  She had pickup [facility] from [facility]

## 2015-07-11 ENCOUNTER — Telehealth: Payer: Self-pay

## 2015-07-11 ENCOUNTER — Telehealth: Payer: Self-pay | Admitting: Gastroenterology

## 2015-07-11 ENCOUNTER — Telehealth: Payer: Self-pay | Admitting: Internal Medicine

## 2015-07-11 NOTE — Telephone Encounter (Signed)
Patient called earlier with blood sugars, & upset stomach. Patient just curious if anything needs to be done.    Yesterday: breakfast @ 9:30- 194 Lunch @ 2:30- 51 Dinner@ 6:30- 234 @9 :30- 65 @11 :00- 108  @1 :00am-145  Today: @7 :30-184 @9 :00- 175 @11 :15- 104 @1 :30-63  has not check since then.   appoved for omnipod,on the way. Patient needs supplies for this states you would know what to order, or let me know and I can order them as well. Thank you.

## 2015-07-11 NOTE — Telephone Encounter (Signed)
Pt was approved and has paid for the omnipod pump  Please contact the pt

## 2015-07-11 NOTE — Telephone Encounter (Signed)
ok 

## 2015-07-11 NOTE — Telephone Encounter (Signed)
I am not sure why she had the lows... For now, I would continue with the current regimen. For the supplies, and is to have the company that sends her the supplies [employer] send me a refill request with all the needed supplies.

## 2015-07-11 NOTE — Telephone Encounter (Signed)
Pt has had some issues today with low blood sugar yesterday and today. Pt has an upset stomach too and that could be why she just wanted you to know to see if she needs to do anything different

## 2015-07-12 ENCOUNTER — Telehealth: Payer: Self-pay | Admitting: Internal Medicine

## 2015-07-12 ENCOUNTER — Telehealth: Payer: Self-pay

## 2015-07-12 NOTE — Telephone Encounter (Signed)
PT states that sometimes her sugars will be 160-190 so she will eat and take one of her shots, then about 3 hours later her sugar will drop down to 50.  She said this has never happened before and would like to know how to fix this

## 2015-07-12 NOTE — Telephone Encounter (Signed)
She says she wants to increase the Linzess dosage again. She is on Linzess 72 mcg. She is continuing the PT for pelvic floor training. Discussed with her the possibility of higher dosing of Linzess may cause diarrhea.

## 2015-07-12 NOTE — Telephone Encounter (Signed)
Spoke with Bonita QuinLinda, RN she will see patient and get her set up for pump, and order her supplies she will need. Will call tomorrow and set up [facility]

## 2015-07-12 NOTE — Telephone Encounter (Signed)
Patient stated that she received her pump yesterday, and now she need the supply's to go with it, she would like for someone to call her

## 2015-07-12 NOTE — Telephone Encounter (Signed)
Patient does not know how to carb count.  She was given dates to see me for this before her pump start.  She wants her husband to come, so she will talk to him, and call me back tomorrow with an appointment time.

## 2015-07-12 NOTE — Telephone Encounter (Signed)
Called and spoke with patient about her sugars, and Dr.Gherghe's recommendation to continue the medications, patient states she is making an appointment with Bonita QuinLinda tomorrow and will get the omnipod situation handled. No other questions or concerns. I advised to call back if she feels she has worsened.

## 2015-07-12 NOTE — Telephone Encounter (Signed)
Ok to increase to , she can take 2 capsules of and see first before we change the prescription.

## 2015-07-12 NOTE — Telephone Encounter (Signed)
Detailed message with instructions left for the patient.

## 2015-07-12 NOTE — Telephone Encounter (Signed)
Patient called back today states the Linzess is not working and wants to know if she can take something else.

## 2015-07-13 NOTE — Telephone Encounter (Signed)
Appointment set for 7/19 for carb counting and 7/31 for pump start.  Husband will be coming to both

## 2015-07-15 ENCOUNTER — Telehealth: Payer: Self-pay | Admitting: Gastroenterology

## 2015-07-15 NOTE — Telephone Encounter (Signed)
Spoke with the patient. She had called a few days ago with complaints the Linzess was not strong enough. She was instructed to double her dosage. She calls today because she has diarrhea (liquid stool). She will skip today to allow her body to recover and her stools to form. She will resume the Linzess at the lower dose. May need to titrate to her needs. Reminded to do her exercises for pelvic floor muscle strengthening.

## 2015-07-20 ENCOUNTER — Encounter: Admitting: Nutrition

## 2015-07-20 ENCOUNTER — Encounter: Attending: Internal Medicine | Admitting: Nutrition

## 2015-07-20 DIAGNOSIS — E139 Other specified diabetes mellitus without complications: Secondary | ICD-10-CM | POA: Diagnosis present

## 2015-07-20 NOTE — Progress Notes (Signed)
Patient is here with her husband to learn carb counting.  She has some memory issues and husband is here for support.  They reported having purchased a book at [employer] on carb counting, and it told them that she needed between 50-60 grams of carbs per meal.  I explained that due to her inactivity, she will need between 30-45.  She does not want to gain weight, and knows the more carbs she eats, the more insulin she needs,and the more weight she will gain.  She wants to shoot for 30 grams per meal.  Discussed carb counting, and she and her husband reported good understanding of this.  She was given a handout with 15 gram portions of carbohyrates, and we also discussed the need for protein and a little fat at each meal.    She requested some breakfast ideas for 30 grams of carbs given, and she was given a handout for this.    They had no final questions.  They were told to call if questions over the next 2 weeks.

## 2015-07-20 NOTE — Patient Instructions (Signed)
Review information given on carb counting, and call if questions.

## 2015-07-25 ENCOUNTER — Telehealth: Payer: Self-pay | Admitting: Gastroenterology

## 2015-07-25 ENCOUNTER — Telehealth: Payer: Self-pay | Admitting: Internal Medicine

## 2015-07-25 NOTE — Telephone Encounter (Signed)
She said several lows were before lunch and some were at bedtime. She said she doesn't think she needs to make any changes until she gets her pump.

## 2015-07-25 NOTE — Telephone Encounter (Signed)
Patient stated that her b/s have been dipping pretty low, could her pump cause that.  please advise

## 2015-07-25 NOTE — Telephone Encounter (Signed)
To make changes in the regimen, I would need to know when the lows occur: before or after meals? At night?

## 2015-07-25 NOTE — Telephone Encounter (Signed)
Spoke to pt. She said her lows have been 60 to 40 1 to 2 times a day. She said she thinks she caused it herself by cutting too many carbs from her diet. She will keep a watch on them and cannot wait to get her Omnipod on the 31st.

## 2015-07-25 NOTE — Telephone Encounter (Signed)
Spoke with the patient. She felt bloated and also felt she was possibly constipated despite Linzess. She took a dosage of MOM. She now has diarrhea. She feels nauseated, but she is also having fluctuations in her blood sugars. She has been drinking water. She will hold her Linzess today. Resume it tomorrow. Follow her diet closely. Today encourage fluids to prevent dehydration.

## 2015-07-25 NOTE — Telephone Encounter (Signed)
OK, be careful then and let us know if she experiences any more lows.

## 2015-07-26 ENCOUNTER — Other Ambulatory Visit: Payer: Self-pay | Admitting: Internal Medicine

## 2015-07-29 ENCOUNTER — Telehealth: Payer: Self-pay | Admitting: Gastroenterology

## 2015-07-29 MED ORDER — LINACLOTIDE 72 MCG PO CAPS: 72.0000 ug | ORAL_CAPSULE | Freq: Every day | ORAL | 3 refills | Status: DC

## 2015-07-29 NOTE — Telephone Encounter (Signed)
[facility] and changed dose on Linzess to twice a day and cancelled other script for once a day

## 2015-08-01 ENCOUNTER — Encounter: Admitting: Nutrition

## 2015-08-01 ENCOUNTER — Encounter: Payer: TRICARE For Life (TFL) | Admitting: Nutrition

## 2015-08-01 DIAGNOSIS — E139 Other specified diabetes mellitus without complications: Secondary | ICD-10-CM

## 2015-08-02 ENCOUNTER — Encounter: Attending: Internal Medicine | Admitting: Nutrition

## 2015-08-02 ENCOUNTER — Telehealth: Payer: Self-pay | Admitting: Nutrition

## 2015-08-02 DIAGNOSIS — E139 Other specified diabetes mellitus without complications: Secondary | ICD-10-CM

## 2015-08-02 NOTE — Patient Instructions (Signed)
Test blood sugars before meals, 2 hours after meals, bedtime and 3 AM. Review the manual on how to give the bolus, and follow steps for this for lunch a supper tonight.

## 2015-08-02 NOTE — Assessment & Plan Note (Signed)
Patient reported no difficulty sleeping with pump, and did no eat as yet today. Blood sugar was 124 at 3AM and 79 today at 7:30AM Basal rate changed to 1.0u/hr per Dr. Charlean Sanfilippo orders. We reviewed what we had covered yesterday, and discussed alerts, and alarms.  We reviewed what to do for each of these instances.  We also discussed correction boluses--how/when do to do them.  Written instructions were given for this to her.  She reported good understanding.   We also discussed high blood sugar protocols and she reported good understanding We will change the pod tomorrow and she was instructed to bring her insulin and a new pod.

## 2015-08-02 NOTE — Assessment & Plan Note (Signed)
[name] is here with her husband, to learn about the OmniPod insulin pump.  We reviewed the basics about  Basal and bolus delivery, and she reported good understanding of this.  She did not want to put the settings into her PDM, so her husband did most of this.   Setting were put in per Dr. Charlean Sanfilippo orders:  Basal rate: 1.15u/hr, I/C ratio: 5, ISF: 50, timing: 4hours, target: 120 with corrections over 130.   We reviewed how to give a bolus, and how to fill and apply the pod.  She was given a handout with the instructions for this. She filled a pod with Novolog insulin with little assistance from me.  She applied the pod to her left upper outer arm, and inserted the needle without hesitation, or pain.   We reviewed again how to give a bolus and she reported good understanding of this.   She took her Lantus last night at 7PM, and her basal rate was stopped until 5PM.   She was instructed to test her blood sugars before meals, 2hr.pc meals, HS and 3 AM.  She was given a sheet to record these readings. I will call her tonight to see how she has done.   They had no final questions.

## 2015-08-02 NOTE — Patient Instructions (Signed)
Read over how to change the pod, and how/ when to do a correction bolus. Call if questions.

## 2015-08-02 NOTE — Telephone Encounter (Signed)
Patient reported no diffiuclty giving self a bolus before lunch and supper.  Blood sugar before supper was 143, and 2hr. PcS: 156, and now: (9:30PM) 151.   No changes made to the pump settings

## 2015-08-03 ENCOUNTER — Encounter: Admitting: Nutrition

## 2015-08-03 DIAGNOSIS — E139 Other specified diabetes mellitus without complications: Secondary | ICD-10-CM

## 2015-08-03 NOTE — Patient Instructions (Signed)
Test blood sugars before meals and at bedtime.   Call me  tomorrow with FBS reading

## 2015-08-03 NOTE — Assessment & Plan Note (Signed)
Patient is here with her husband for final review/training of her OmniPod pump.  Blood sugar yesterday was 146acS, 170HS (which she did a correction bolus) and FBS today was 102.   She is very please with the pump and not having any difficulty giving boluses, and counting carbs. She did a pod change with very little assistance from me.   We also discussed high blood sugar protocol, sick days, temp basal rates--how and when to use them.  She reported good understanding all of this.  She was given handouts for these topics.   She signed off as understanding all topics and had no final questions.  I will see her in 2 weeks to download her PDM to evaluate pump settings.  She will call me tomorrow to let me know what her FBSs are, and if lower than 90, I will reduce her basal rate somewhat.  She is afraid of low blood sugars.

## 2015-08-04 ENCOUNTER — Other Ambulatory Visit: Payer: Self-pay

## 2015-08-04 MED ORDER — FREESTYLE LANCETS MISC: 1 refills | Status: DC

## 2015-08-04 MED ORDER — GLUCOSE BLOOD VI STRP: ORAL_STRIP | 1 refills | Status: DC

## 2015-08-05 ENCOUNTER — Telehealth: Payer: Self-pay | Admitting: Gastroenterology

## 2015-08-05 NOTE — Telephone Encounter (Signed)
Patient is taking Linzess 145 mcg. No bowel movement today. Feels bloated. Admits she also took MOM this morning and has just had a glass of Miralax. She agrees to wait to give these a chance to work understanding that it can take 12 to 24 hours. Follow up on 08/08/15.

## 2015-08-10 ENCOUNTER — Telehealth: Payer: Self-pay

## 2015-08-10 NOTE — Telephone Encounter (Signed)
Patient need to ask question about basal rate, she thinks it's needs some adjusting, b/s was 116 at bed time and 194 this morning.please advise

## 2015-08-10 NOTE — Telephone Encounter (Signed)
Called patient back, she stated that she has not been on her pump very long, she does know that the basal rate is 1.0, that was the only information she could give me. Patient has not checked sugar today yet. Last readings was the 116 last night and 194 early this morning. Patient states she has been stressing over all her health problems and says this is only one time this has jumped like that over night. States her sugars have been jumping around but not like that. Patient states she will call back if she has any other issues with the sugars and she has an appointment to see you and will come to that to talk to you.

## 2015-08-10 NOTE — Telephone Encounter (Signed)
What are her current pump settings? Also, Is this a pattern that keeps repeating or just  One time event?

## 2015-08-10 NOTE — Telephone Encounter (Signed)
I would not want to change her settings based on just one high reading. Let's continue to follow the sugars and let me know if this pattern continues in the next 1-2 weeks.

## 2015-08-12 NOTE — Telephone Encounter (Signed)
Patient calling again to see what other medication she can try. Feels miserable and irritable.

## 2015-08-12 NOTE — Telephone Encounter (Signed)
Discussed bowel movements. She is using a Fleets enema once to twice a week. She is unsatisfied with Linzess.  She has changed her dosage of Linzess several times with it either being too strong or too weak. We have discussed the pelvic floor exercises, diet and exercise. At this point she would like to know other options for treating her bloating and inability to have complete bowel movements.

## 2015-08-15 ENCOUNTER — Telehealth: Payer: Self-pay | Admitting: Internal Medicine

## 2015-08-15 ENCOUNTER — Telehealth: Payer: Self-pay

## 2015-08-15 NOTE — Telephone Encounter (Signed)
Called and notified patient to be here tomorrow at 8:30 per Dr.Gherghe wanted to see her.

## 2015-08-15 NOTE — Telephone Encounter (Signed)
If she is willing to go back on Amitiza, please send Rx for Amitiza twice daily, continue Pelvic floor PT. Increase fluid intake and avoid excessive amount of fiber.

## 2015-08-15 NOTE — Telephone Encounter (Signed)
Patient need to talk to Dr Elvera LennoxGherghe asap!!She is all upset she is not understanding how to use her pump, her B/s are running in the 400 dreds.

## 2015-08-15 NOTE — Telephone Encounter (Signed)
See note

## 2015-08-15 NOTE — Telephone Encounter (Signed)
Let me see her tomorrow in clinic at 8:30 AM.

## 2015-08-15 NOTE — Telephone Encounter (Signed)
Pt has had very high sugars 8/12 am 180; lunch 110; 123 dinner; 193 bedtime 8/13 am 166; 257 1 hour after breakfast; 272 2 hours after breakfast; 294 at lunch; 45-2 hours after lunch; 149 before dinner; 164 after dinner; bedtime 209; middle of the night 194 pt states she only has 38 grams of carbs thru the day.  8/14 am 134; then had protein drink 183

## 2015-08-16 ENCOUNTER — Ambulatory Visit (INDEPENDENT_AMBULATORY_CARE_PROVIDER_SITE_OTHER): Admitting: Internal Medicine

## 2015-08-16 ENCOUNTER — Telehealth: Payer: Self-pay

## 2015-08-16 ENCOUNTER — Encounter: Payer: Self-pay | Admitting: Internal Medicine

## 2015-08-16 VITALS — BP 142/82 | HR 97 | Ht 65.0 in | Wt 170.0 lb

## 2015-08-16 DIAGNOSIS — E139 Other specified diabetes mellitus without complications: Secondary | ICD-10-CM

## 2015-08-16 MED ORDER — GLUCOSE BLOOD VI STRP: ORAL_STRIP | 3 refills | Status: DC

## 2015-08-16 MED ORDER — INSULIN ASPART 100 UNIT/ML ~~LOC~~ SOLN: SUBCUTANEOUS | 3 refills | Status: DC

## 2015-08-16 NOTE — Telephone Encounter (Signed)
Patient want to know if she need a new prescription for the omni pod.please advise

## 2015-08-16 NOTE — Patient Instructions (Signed)
Please change pump setting:  - basal rates: 12 am: 1 units/h except: 10 pm - 10 am: 1.1  - ICR: 5 - target: 120-130 >> 120-120 - ISF: 50 >> 60  - Insulin on Board: 4h  Please return in 1 weeks with your sugar log.

## 2015-08-16 NOTE — Telephone Encounter (Signed)
Please call patient about her medication. °

## 2015-08-16 NOTE — Telephone Encounter (Signed)
Called patient back, stated she had gotten in touch with the omnipod supplier, and they had everything they needed. No other questions at this time.

## 2015-08-16 NOTE — Progress Notes (Signed)
Patient ID: [name], female   DOB: 06-Dec-1955, 60 y.o.   MRN: [id number]  HPI: [name] is a 60 y.o.-year-old female, initially referred by her PCP, Dr. Sharilyn Sites (PA: Collene Mares), returning for f/u for LADA, dx 2005, insulin-dependent since ~2006, controlled, with complications (PN, gastroparesis?). Last visit 3 mo ago.  She started on an Omnipod insulin pump since last visit (08/01/2015).  Previous fructosamine with corresponding HbA1c levels: 05/24/2015: HbA1c calculated from fructosamine is 6.9%. Office Visit on 11/23/2014  Component Date Value Ref Range HbA1c - calculated   F Fructosamine 02/23/2015  334* 190 - 270 umol/L 7.28%         . Fructosamine 11/23/2014 332* 190 - 270 umol/L 7.25%   . Fructosamine 09/01/2014 328* 190 - 270 umol/L 7.18%   . Fructosamine 04/20/2014 388* 190 - 270 umol/L 8.2%.   . Fructosamine- 10/29/2013 418* 190 - 270 umol/L 8.7%    Lab Results  Component Value Date   HGBA1C 4.6 07/16/2013   HGBA1C sent to [facility] 07/16/2013   HGBA1C 4.8 04/15/2012  - 02/12/2013: 4.2% - 12/19/2012: 5.2% - 09/18/2012: 6.5%  She was on:  - Metformin 500 mg 2x a day  - Lantus 32 >> 15 units in am and 25 units at bedtime >> 10 units in am and 34 units at bedtime. - NovoLog before a meal as follows:  - 11 units b-efore a small meal - 12 units before a regular meal  - 13 units before a large meal  - Sliding scale of NovoLog: - 150-200: + 1 unit  - 201-250: + 2 units  - 251-300: + 3 units  - >300: + 4 units   She is now on an insulin pump, started after last visit: Pump settings: - basal rates: 12 am: 1 units/h - ICR: 5 - target: 120-130 - ISF: 50 - Insulin on Board: 4h - bolus wizard: on TDD from basal insulin: 24 units  TDD from bolus insulin: 9-20 units - extended bolusing: not using - changes infusion site: q3 days - Meter: Omnipod  Also on: - Metformin 500 mg 2x a day   Pt checks her sugars 6-8 a day:  - am: 100-222 >>  64-140, 180, 228 >> 57x1, 112-182, 208, 325 >>  51x1, 130-168, 208 >> 87-227 >> 115, 154-246 - 2h after b'fast: 124-239 >> 159-253 >> 63-109, 268 >> 101-208 >> 124, 194, 236 >> 59, 108-182, 237 >> 80-190 - before lunch: 44, 52-156 >> 50, 58-169 >> 34, 45, 59-189, 182 >> 49x 1 (delayed lunch), 105-153, 214 - 2h after lunch: 74-110, 198 >> 156, 159 >> 140-218 >> 68-155 >> 48, 297 >> 38, 92-150 >> 151-196 - before dinner: 72, 130-290 >> 40, 45, 100-209, 273 >> 98-173, 200 >> 83-169, 221, 274 >> 60, 116-302 >> 91-210 - 2h after dinner: 117 >> n/c >> 142 >> 101, 149 >> 118, 208, 271 >> 160 >> 146 >> 178 >> 38, 50-194 >> 58x1, 67x1, 108-219 - bedtime: 83-203 >> 58, 81-200, 210, 251 >> 68, 99-231 >> 119-162 >> see above - at night: 37 x1, 110 >> n/c >> 108, 228 >> n/c >> 41 x1, 75-202 >> 68-143, 172, 206, 298 >> 50, 57-244, 302 >> 141-185 Has lows. Lowest sugar was 51 >> 34 and 38 >> 49; she has hypoglycemia awareness at 50. Highest sugar was 347 >> 302 >> 200s  Pt's meals are: - Breakfast: toast, bowl of cereal + 2% milk  - Lunch: soup,  sometimes sandwich, vegetables - Dinner: chicken/pork/beef + vegetable, no starch - Snacks: sugar free sweets  - no CKD, last BUN/creatinine:  Received labs from PCP, from 08/31/2014: - CMP normal, except glucose 194, albumin 5.1 (3.6-4.8). BUN/creatinine 13/0.63, EGFR 97. Previously: Lab Results  Component Value Date   BUN 16 02/24/2014   CREATININE 0.71 02/24/2014  On Losartan. - last set of lipids:  Lab Results  Component Value Date   CHOL 161 09/01/2014   HDL 45.40 09/01/2014   LDLCALC 98 09/01/2014   TRIG 85.0 09/01/2014   CHOLHDL 4 09/01/2014  161/92/46/97 in 03/18/2013.  Not on a statin.  - last eye exam was in [place]. No DR. + small cataract. Office Visit on 02/23/2015  Component Date Value Ref Range Status  . HM Diabetic Eye Exam 12/15/2014 No Retinopathy  No Retinopathy Final   - + numbness and tingling in her feet. On Neurontin.  She  is seeing a podiatrist [facility]).  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 She recently stopped [place]. Also: GERD, HTN, HL, Fibromyalgia >> on Gabapentin.  I reviewed pt's medications, allergies, PMH, social hx, family hx, and changes were documented in the history of present illness. Otherwise, unchanged from my initial visit note.   ROS: Constitutional: + weight gain, + fatigue, + hot flushes, + nocturia Eyes: no blurry vision, no xerophthalmia ENT: no sore throat, no nodules palpated in throat, no dysphagia/no odynophagia, no hoarseness Cardiovascular: no CP/SOB/palpitations/no leg swelling Respiratory: no cough/SOB Gastrointestinal: no N/V/D/+ C/no heartburn Musculoskeletal: + muscle/no joint aches Skin: no rashes,no hair loss Neurological: + tremors/no numbness/tingling/dizziness  PE: BP (!) 142/82 (BP Location: Left Arm, Patient Position: Sitting)   Pulse 97   Ht '5\' 5"'$  (1.651 m)   Wt 170 lb (77.1 kg)   SpO2 97%   BMI 28.29 kg/m  Body mass index is 28.29 kg/m.  Wt Readings from Last 3 Encounters:  08/16/15 170 lb (77.1 kg)  06/02/15 175 lb 6.4 oz (79.6 kg)  05/24/15 173 lb 9.6 oz (78.7 kg)   Constitutional: overweight, in distress b/c feeling nauseated Eyes: PERRLA, EOMI, no exophthalmos ENT: moist mucous membranes, no thyromegaly, no cervical lymphadenopathy Cardiovascular: Tachycardia, RR, No MRG Respiratory: CTA B Gastrointestinal: abdomen soft, NT, ND, BS+ Musculoskeletal: no deformities, strength intact in all 4 Skin: moist, warm, no rashes Neurological: no tremor with outstretched hands, DTR normal in all 4  ASSESSMENT: 1. LADA, insulin-dependent, uncontrolled, with complications - peripheral neuropathy - gastroparesis?  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 She still has a positive C peptide >> still has insulin secretion. For this reason, we continued the Metformin for now. Welchol was started by PCP for cholesterol lowering, so we continued this, also.  Labs from 11/17/2013: - ACR 29.4 - CBC with differential normal, except eosinophiles 6% (0-5) - CMP normal, with a BUN/creatinine ratio of 13/0.55, random glucose 73, GFR >89 - Lipids: 161/92/46/97 - TSH 1.918 - Urinalysis normal, without ketones or glucose  Insurance does not cover nutrition  visits...  PLAN:  1. Patient with LADA, on basal-bolus + oral antidiabetic regimen, with better sugars after starting the insulin pump ~2.5 weeks ago. Her sugars are slightly higher throughout the day, especially at night and in a.m. We'll increase the basal rates from 10 PM to 10 AM. She can get slightly higher after meals and then she corrects the postprandial hyperglycemia with subsequent lows. We will therefore decrease her target to 120-120 and increase her insulin sensitivity factor to 60. We'll make further changes at next visit, which is already scheduled in one week. - Patient is very contentious, writing down everything that she eats, calculating the amount of calories and protein with each meal. She is doing a wonderful job changing her pod frequently and is checking her sugars 6-8 times a day. -  I suggested to:  Patient Instructions  Please change pump setting:  - basal rates: 12 am: 1 units/h except: 10 pm - 10 am: 1.1  - ICR: 5 - target: 120-130 >> 120-120 - ISF: 50 >> 60  - Insulin on Board: 4h  Please return in 1 week with your sugar log.   -  Patient did not know how to change the pump settings, so I did this for her during the appointment - continue checking sugars at different times of the day - check 3-4 times a day,  rotating checks - UTD with eye exams - will check a fructosamine at next visit, which is scheduled from 1 week from now  - time spent with the patient: 40 min, of which >50% was spent in reviewing her pump downloads, discussing her hypo- and hyper-glycemic episodes, reviewing previous labs and pump settings and developing a plan to avoid hypo- and hyper-glycemia.

## 2015-08-17 ENCOUNTER — Telehealth: Payer: Self-pay | Admitting: Internal Medicine

## 2015-08-17 ENCOUNTER — Other Ambulatory Visit: Payer: Self-pay

## 2015-08-17 ENCOUNTER — Ambulatory Visit (HOSPITAL_COMMUNITY): Payer: Self-pay | Admitting: Psychiatry

## 2015-08-17 MED ORDER — LUBIPROSTONE 24 MCG PO CAPS: 24.0000 ug | ORAL_CAPSULE | Freq: Two times a day (BID) | ORAL | 3 refills | Status: DC

## 2015-08-17 NOTE — Telephone Encounter (Signed)
I left a message for the patient to call if she was interested in trying Amitiza again.

## 2015-08-17 NOTE — Telephone Encounter (Signed)
[facility] told PT they are needing clarification of her insulin prescription.  She provided me with their phone number. [phone]

## 2015-08-17 NOTE — Telephone Encounter (Signed)
Discussed and she will try it again. New rx to [facility]

## 2015-08-18 ENCOUNTER — Telehealth: Payer: Self-pay | Admitting: Gastroenterology

## 2015-08-18 MED ORDER — LUBIPROSTONE 24 MCG PO CAPS: 24.0000 ug | ORAL_CAPSULE | Freq: Two times a day (BID) | ORAL | 3 refills | Status: DC

## 2015-08-18 NOTE — Telephone Encounter (Signed)
Med resent 90 day supply to [facility]

## 2015-08-22 ENCOUNTER — Telehealth: Payer: Self-pay

## 2015-08-22 ENCOUNTER — Telehealth: Payer: Self-pay | Admitting: Internal Medicine

## 2015-08-22 NOTE — Telephone Encounter (Signed)
Returned patient phone call, patient is disappointed that sugars are not going down, I got sugars for the past week.  8/16- before breakfast> 170 2 hours after> 198 Before lunch> 279 Before dinner> 100 At bedtime> 273  8/17- before breakfast> 183 Before lunch> 103 Before dinner> 95 Bedtime> 253  8/18- before breakfast> 171 Before lunch> 152 Before dinner> 95  8/19- before breakfast> 229 2 hour after breakfast> 168 Dinner> 174 Bedtime> 107  8/20- before breakfast> 116 2 hours after breakfast> 92 Before lunch> 131 Before dinner> 209 Bedtime> 229   Patient states she is coming in on Wednesday at 9:15, but wanted you to be notified of her sugars. Please advise. Thank you!

## 2015-08-22 NOTE — Telephone Encounter (Signed)
PT requests call back at any time this afternoon to discuss with nurse why her sugars are not going down.

## 2015-08-22 NOTE — Telephone Encounter (Signed)
No changes for now.

## 2015-08-24 ENCOUNTER — Ambulatory Visit (INDEPENDENT_AMBULATORY_CARE_PROVIDER_SITE_OTHER): Admitting: Internal Medicine

## 2015-08-24 ENCOUNTER — Ambulatory Visit: Payer: Self-pay | Admitting: Internal Medicine

## 2015-08-24 ENCOUNTER — Encounter: Payer: Self-pay | Admitting: Internal Medicine

## 2015-08-24 VITALS — Ht 65.0 in

## 2015-08-24 DIAGNOSIS — E139 Other specified diabetes mellitus without complications: Secondary | ICD-10-CM

## 2015-08-24 LAB — COMPLETE METABOLIC PANEL WITH GFR
ALT: 16 U/L (ref 6–29)
AST: 16 U/L (ref 10–35)
Albumin: 4.5 g/dL (ref 3.6–5.1)
Alkaline Phosphatase: 78 U/L (ref 33–130)
BILIRUBIN TOTAL: 0.4 mg/dL (ref 0.2–1.2)
BUN: 15 mg/dL (ref 7–25)
CHLORIDE: 102 mmol/L (ref 98–110)
CO2: 25 mmol/L (ref 20–31)
CREATININE: 0.59 mg/dL (ref 0.50–0.99)
Calcium: 9.7 mg/dL (ref 8.6–10.4)
GFR, Est African American: >89 mL/min (ref >=60)
GFR, Est Non African American: >89 mL/min (ref >=60)
GLUCOSE: 170 mg/dL — AB (ref 65–99)
Potassium: 4.8 mmol/L (ref 3.5–5.3)
SODIUM: 139 mmol/L (ref 135–146)
TOTAL PROTEIN: 7 g/dL (ref 6.1–8.1)

## 2015-08-24 LAB — LIPID PANEL
CHOL/HDL RATIO: 4
CHOLESTEROL: 206 mg/dL — AB (ref 0–200)
HDL: 51.6 mg/dL (ref >39.00)
LDL CALC: 133 mg/dL — AB (ref 0–99)
NONHDL: 154.86
Triglycerides: 109 mg/dL (ref 0.0–149.0)
VLDL: 21.8 mg/dL (ref 0.0–40.0)

## 2015-08-24 LAB — TSH: TSH: 0.81 u[IU]/mL (ref 0.35–4.50)

## 2015-08-24 NOTE — Progress Notes (Signed)
Pre visit review using our clinic review tool, if applicable. No additional management support is needed unless otherwise documented below in the visit note. 

## 2015-08-24 NOTE — Patient Instructions (Addendum)
Please change pump settings:  - basal rates: 12 am - 4 pm >> 1.1 4 pm - 12 am >> 1.15 - ICR:  12 am - 4 pm: 5 4 pm-12 am: 4.5 - target: 120-120 - ISF:  12 am - 7 am: 60 7 am- 10 am: 50  - Insulin on Board: 4h  Please return in 1.5 months.  Please stop at the lab.

## 2015-08-24 NOTE — Progress Notes (Addendum)
Patient ID: [name], female   DOB: Jun 15, 1955, 60 y.o.   MRN: [id number]  HPI: [name] is a 60 y.o.-year-old female, initially referred by her PCP, Dr. Sharilyn Sites (PA: Collene Mares), returning for f/u for LADA, dx 2005, insulin-dependent since ~2006, controlled, with complications (PN, gastroparesis?). Last visit 1 week ago.  She started on an Omnipod insulin pump on 08/01/2015.  Previous fructosamine with corresponding HbA1c levels: 05/24/2015: HbA1c calculated from fructosamine is 6.9%. Office Visit on 11/23/2014  Component Date Value Ref Range HbA1c - calculated   F Fructosamine 02/23/2015  334* 190 - 270 umol/L 7.28%         . Fructosamine 11/23/2014 332* 190 - 270 umol/L 7.25%   . Fructosamine 09/01/2014 328* 190 - 270 umol/L 7.18%   . Fructosamine 04/20/2014 388* 190 - 270 umol/L 8.2%.   . Fructosamine- 10/29/2013 418* 190 - 270 umol/L 8.7%    Lab Results  Component Value Date   HGBA1C 4.6 07/16/2013   HGBA1C sent to [facility] 07/16/2013   HGBA1C 4.8 04/15/2012  - 02/12/2013: 4.2% - 12/19/2012: 5.2% - 09/18/2012: 6.5%  She is now on an insulin pump: Pump settings changed 1 week ago: - basal rates: 12 am: 1 units/h except: 10 pm - 10 am: 1.1  - ICR: 5 - target: 120-130 >> 120-120 - ISF: 50 >> 60  - Insulin on Board: 4h - extended bolusing: not using - changes infusion site: q3 days - Meter: Omnipod  Also on: - Metformin 500 mg 2x a day   Pt checks her sugars 6-8 a day: In last week, after last pump setting changes - improved - am: 64-140, 180, 228 >> 57x1, 112-182, 208, 325 >>  51x1, 130-168, 208 >> 87-227 >> 115, 154-246 >> 116-202, 229 - 2h after b'fast: 159-253 >> 63-109, 268 >> 101-208 >> 124, 194, 236 >> 59, 108-182, 237 >> 80-190 >> 92-236 - before lunch: 44, 52-156 >> 50, 58-169 >> 34, 45, 59-189, 182 >> 49x 1 (delayed lunch), 105-153, 214 >> 89-166 - 2h after lunch: 74-110, 198 >> 156, 159 >> 140-218 >> 68-155 >> 48, 297 >> 38, 92-150 >>  151-196 >> n/c - before dinner: 40, 45, 100-209, 273 >> 98-173, 200 >> 83-169, 221, 274 >> 60, 116-302 >> 91-210 >> 174-223, 295 - 2h after dinner: 142 >> 101, 149 >> 118, 208, 271 >> 160 >> 146 >> 178 >> 38, 50-194 >> 58x1, 67x1, 108-219 >> 107-277 - bedtime: 83-203 >> 58, 81-200, 210, 251 >> 68, 99-231 >> 119-162 >> see above - at night: 108, 228 >> n/c >> 41 x1, 75-202 >> 68-143, 172, 206, 298 >> 50, 57-244, 302 >> 141-185 >> 187 Has lows. Lowest sugar was 51 >> 34 and 38 >> 49 >> 89; she has hypoglycemia awareness at 50. Highest sugar was 347 >> 302 >> 200s >> 295.  Pt's meals are: - Breakfast: toast, bowl of cereal + 2% milk  - Lunch: soup, sometimes sandwich, vegetables - Dinner: chicken/pork/beef + vegetable, no starch - Snacks: sugar free sweets  - no CKD, last BUN/creatinine:  Received labs from PCP, from 08/31/2014: - CMP normal, except glucose 194, albumin 5.1 (3.6-4.8). BUN/creatinine 13/0.63, EGFR 97. Previously: Lab Results  Component Value Date   BUN 16 02/24/2014   CREATININE 0.71 02/24/2014  On Losartan. - last set of lipids:  Lab Results  Component Value Date   CHOL 161 09/01/2014   HDL 45.40 09/01/2014   LDLCALC 98 09/01/2014  TRIG 85.0 09/01/2014   CHOLHDL 4 09/01/2014  161/92/46/97 in 03/18/2013.  Not on a statin.  - last eye exam was in [place]. No DR. + small cataract. Office Visit on 02/23/2015  Component Date Value Ref Range Status  . HM Diabetic Eye Exam 12/15/2014 No Retinopathy  No Retinopathy Final   - + numbness and tingling in her feet. On Neurontin.  She is seeing a podiatrist [facility]).  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 She recently stopped [place]. Also: GERD, HTN, HL, Fibromyalgia >> on Gabapentin.  Last TSH: Lab Results  Component Value Date   TSH 2.038 04/15/2012   I reviewed pt's medications, allergies, PMH, social hx, family hx, and changes  were documented in the history of present illness. Otherwise, unchanged from my initial visit note.   ROS: Constitutional: no weight gain, + fatigue, + hot flushes, + nocturia Eyes: no blurry vision, no xerophthalmia ENT: no sore throat, no nodules palpated in throat, no dysphagia/no odynophagia, no hoarseness Cardiovascular: no CP/SOB/palpitations/no leg swelling Respiratory: no cough/SOB Gastrointestinal: no N/V/D/+ C/no heartburn Musculoskeletal: no muscle/no joint aches Skin: no rashes,no hair loss Neurological: + tremors/no numbness/tingling/dizziness  PE: Ht '5\' 5"'  (1.651 m)    Wt Readings from Last 3 Encounters:  08/16/15 170 lb (77.1 kg)  06/02/15 175 lb 6.4 oz (79.6 kg)  05/24/15 173 lb 9.6 oz (78.7 kg)   Constitutional: overweight, in distress b/c feeling nauseated Eyes: PERRLA, EOMI, no exophthalmos ENT: moist mucous membranes, no thyromegaly, no cervical lymphadenopathy Cardiovascular: Tachycardia, RR, No MRG Respiratory: CTA B Gastrointestinal: abdomen soft, NT, ND, BS+ Musculoskeletal: no deformities, strength intact in all 4 Skin: moist, warm, no rashes Neurological: no tremor with outstretched hands, DTR normal in all 4  ASSESSMENT: 1. LADA, insulin-dependent, uncontrolled, with complications - peripheral neuropathy - gastroparesis?  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 She still has a positive C peptide >> still has insulin secretion. For this reason, we continued the Metformin for now. Welchol was started by PCP for cholesterol lowering, so we continued this, also.  Labs from 11/17/2013: - ACR 29.4 - CBC with differential normal, except eosinophiles 6% (0-5) - CMP normal, with  a BUN/creatinine ratio of 13/0.55, random glucose 73, GFR >89 - Lipids: 161/92/46/97 - TSH 1.918 - Urinalysis normal, without ketones or glucose  PLAN:  1. Patient with LADA, on basal-bolus + oral antidiabetic regimen, with better sugars after starting the insulin pump and improved since last visit 1 week ago. - Her sugars are still high later in the day >> will increase basal rates and decrease ICR with dinner.  - We'll also decrease her ISF with b'fast to allow for better sugars after this meal  - Patient is very constientious, writing down everything that she eats, calculating the amount of calories and protein with each meal. She is doing a wonderful job changing her pod frequently and is checking her sugars 6-8 times a day. -  I suggested to:  Patient Instructions  Please change  pump settings:  - basal rates: 12 am - 4 pm >> 1.1 4 pm - 12 am >> 1.15 - ICR:  12 am - 4 pm: 5 4 pm-12 am: 4.5 - target: 120-120 - ISF:  12 am - 7 am: 60 7 am- 10 am: 50  - Insulin on Board: 4h  Please return in 1.5 months.  Please stop at the lab.  - continue checking sugars at different times of the day - check 3-4 times a day, rotating checks - UTD with eye exams - will check a fructosamine now, will add a CMP and Lipid panel (had oatmeal this am) - Please return in 1.5 months    Orders Placed This Encounter  Procedures  . Fructosamine  . COMPLETE METABOLIC PANEL WITH GFR  . Lipid panel  . TSH   Office Visit on 08/24/2015  Component Date Value Ref Range Status  . Fructosamine 08/26/2015 342* 190 - 270 umol/L Final  . Sodium 08/24/2015 139  135 - 146 mmol/L Final  . Potassium 08/24/2015 4.8  3.5 - 5.3 mmol/L Final  . Chloride 08/24/2015 102  98 - 110 mmol/L Final  . CO2 08/24/2015 25  20 - 31 mmol/L Final  . Glucose, Bld 08/24/2015 170* 65 - 99 mg/dL Final  . BUN 08/24/2015 15  7 - 25 mg/dL Final  . Creat 08/24/2015 0.59  0.50 - 0.99 mg/dL Final   Comment:   For patients > or = 62  years of age: The upper reference limit for Creatinine is approximately 13% higher for people identified as African-American.     . Total Bilirubin 08/24/2015 0.4  0.2 - 1.2 mg/dL Final  . Alkaline Phosphatase 08/24/2015 78  33 - 130 U/L Final  . AST 08/24/2015 16  10 - 35 U/L Final  . ALT 08/24/2015 16  6 - 29 U/L Final  . Total Protein 08/24/2015 7.0  6.1 - 8.1 g/dL Final  . Albumin 08/24/2015 4.5  3.6 - 5.1 g/dL Final  . Calcium 08/24/2015 9.7  8.6 - 10.4 mg/dL Final  . GFR, Est African American 08/24/2015 >89  >=60 mL/min Final  . GFR, Est Non African American 08/24/2015 >89  >=60 mL/min Final  . Cholesterol 08/24/2015 206* 0 - 200 mg/dL Final  . Triglycerides 08/24/2015 109.0  0.0 - 149.0 mg/dL Final  . HDL 08/24/2015 51.60  >39.00 mg/dL Final  . VLDL 08/24/2015 21.8  0.0 - 40.0 mg/dL Final  . LDL Cholesterol 08/24/2015 133* 0 - 99 mg/dL Final  . Total CHOL/HDL Ratio 08/24/2015 4   Final  . NonHDL 08/24/2015 154.86   Final  . TSH 08/24/2015 0.81  0.35 - 4.50 uIU/mL Final   HbA1c calculated from fructosamine is 7.4%.  - time spent with the patient: 40 min, of which >50% was spent in reviewing her pump downloads, discussing her hypo- and hyper-glycemic episodes, reviewing previous labs and pump settings and developing a plan to avoid hypo- and hyper-glycemia.   Philemon Kingdom, MD PhD [facility]

## 2015-08-26 ENCOUNTER — Ambulatory Visit (INDEPENDENT_AMBULATORY_CARE_PROVIDER_SITE_OTHER): Admitting: Gastroenterology

## 2015-08-26 ENCOUNTER — Telehealth: Payer: Self-pay | Admitting: Internal Medicine

## 2015-08-26 ENCOUNTER — Encounter: Payer: Self-pay | Admitting: Gastroenterology

## 2015-08-26 ENCOUNTER — Telehealth: Payer: Self-pay

## 2015-08-26 ENCOUNTER — Telehealth: Payer: Self-pay | Admitting: Gastroenterology

## 2015-08-26 VITALS — BP 144/80 | HR 84 | Ht 64.0 in | Wt 169.0 lb

## 2015-08-26 DIAGNOSIS — K5902 Outlet dysfunction constipation: Secondary | ICD-10-CM | POA: Diagnosis not present

## 2015-08-26 DIAGNOSIS — K589 Irritable bowel syndrome without diarrhea: Secondary | ICD-10-CM

## 2015-08-26 LAB — FRUCTOSAMINE: FRUCTOSAMINE: 342 umol/L — AB (ref 190–270)

## 2015-08-26 MED ORDER — LINACLOTIDE 145 MCG PO CAPS: 145.0000 ug | ORAL_CAPSULE | Freq: Every day | ORAL | 3 refills | Status: DC

## 2015-08-26 NOTE — Telephone Encounter (Signed)
Called and spoke with patient about Dr.Gherghe note about not changing the pump settings. Patient agreed, and will call if sugars stay above 200's.

## 2015-08-26 NOTE — Patient Instructions (Signed)
Take Linzess 145mg  daily  Take Align daily  Follow up in 4 months

## 2015-08-26 NOTE — Progress Notes (Signed)
[name]    [id number]    04-20-1955  Primary Care Physician:GOLDING, Chancy Hurter, MD  Referring Physician: Assunta Found, MD [address] [place], [place] [address]  Chief complaint: IBS, constipation  HPI:  60 year old female with history of fibromyalgia, irritable bowel syndrome predominant constipation here for follow-up visit. She did a few sessions of physical therapy with Eulis Foster for pelvic floor dysfunction and has noticed some improvement but feels she is back to having issues once she stopped the physical therapy and continues to have constipation. She is currently taking Linzess 72 g daily but does not feel that she is able to empty completely. At last office visit in March, 2016 the dose of Linzess was decreased from 145 g daily to 72 g daily as patient had complained of increased bowel frequency. Denies any nausea, vomiting, abdominal pain, melena or bright red blood per rectum. On insulin pump for diabetes control, continues to have adjustments. She also complained of worsening of fibromyalgia, anxiety and panic attacks. She is following with a psychiatrist at behavioral health. Denies any suicidal or homicidal ideation   Outpatient Encounter Prescriptions as of 08/26/2015  Medication Sig  . AMBULATORY NON FORMULARY MEDICATION Squatty Potty x 1  . amLODipine (NORVASC) 5 MG tablet Take 5 mg by mouth daily.  . BD PEN NEEDLE NANO U/F 32G X 4 MM MISC   . BENICAR 40 MG tablet   . clonazePAM (KLONOPIN) 1 MG tablet Take 1 tab three times a day as needed for anxiety  . FLUoxetine (PROZAC) 40 MG capsule Take 1 capsule (40 mg total) by mouth daily.  Marland Kitchen gabapentin (NEURONTIN) 800 MG tablet Take 800 mg by mouth. 1 tablet in the morning and 2 tablets at bedtime.  Marland Kitchen glucose blood (FREESTYLE LITE) test strip Check sugars 4x a day. Dx code: E13.9  . insulin aspart (NOVOLOG) 100 UNIT/ML injection Use up to 60 units daily in the insulin pump  . lamoTRIgine  (LAMICTAL) 200 MG tablet Take 1 tablet (200 mg total) by mouth daily.  . Lancets (FREESTYLE) lancets Use to test blood sugar 4 to 6 times daily as instructed.  . linaclotide (LINZESS) 72 MCG capsule Take 72 mcg by mouth 2 (two) times daily.  Marland Kitchen lubiprostone (AMITIZA) 24 MCG capsule Take 1 capsule (24 mcg total) by mouth 2 (two) times daily with a meal.  . metFORMIN (GLUCOPHAGE) 500 MG tablet TAKE 1 TABLET TWICE A DAY WITH MEALS  . NUCYNTA ER 200 MG TB12   . omeprazole (PRILOSEC) 40 MG capsule Take 1 capsule (40 mg total) by mouth 2 (two) times daily.  . ondansetron (ZOFRAN ODT) 4 MG disintegrating tablet Take 1 tablet (4 mg total) by mouth 3 (three) times daily with meals.   No facility-administered encounter medications on file as of 08/26/2015.     Allergies as of 08/26/2015 - Review Complete 08/26/2015  Allergen Reaction Noted  . Reglan [metoclopramide] Other (See Comments) 03/23/2013    Past Medical History:  Diagnosis Date  . Bipolar 1 disorder (HCC)   . Breast density 06/25/2012   Right breast density, will get mammogram and [place]  . Depression   . Diabetes mellitus without complication (HCC)   . Duodenitis   . HTN (hypertension)   . Hyperlipidemia   . IBS (irritable bowel syndrome)     Past Surgical History:  Procedure Laterality Date  . ABDOMINAL HYSTERECTOMY    . CHOLECYSTECTOMY    .  COLONOSCOPY      Family History  Problem Relation Age of Onset  . Hypertension Mother   . Bipolar disorder Mother   . Heart disease Brother   . Thyroid disease Sister   . Hypertension Sister   . Bipolar disorder Sister   . Depression Father   . Bipolar disorder Daughter   . Bipolar disorder Maternal Aunt   . Colon cancer Neg Hx   . Colon polyps Neg Hx   . Esophageal cancer Neg Hx   . Kidney disease Neg Hx   . Gallbladder disease Neg Hx   . Diabetes Neg Hx     Social History   Social History  . Marital status: Married    Spouse name: N/A  . Number of children: 1  . Years of  education: N/A   Occupational History  . Disabled    Social History Main Topics  . Smoking status: Never Smoker  . Smokeless tobacco: Never Used  . Alcohol use No  . Drug use: No  . Sexual activity: Not Currently   Other Topics Concern  . Not on file   Social History Narrative  . No narrative on file      Review of systems: Review of Systems  Constitutional: Negative for fever and chills.  HENT: Negative.   Eyes: Negative for blurred vision.  Respiratory: Negative for cough, shortness of breath and wheezing.   Cardiovascular: Negative for chest pain and palpitations.  Gastrointestinal: as per HPI Genitourinary: Negative for dysuria, urgency, frequency and hematuria.  Musculoskeletal: Positive for myalgias, back pain and joint pain.  Skin: Negative for itching and rash.  Neurological: Negative for dizziness, tremors, focal weakness, seizures and loss of consciousness.  Endo/Heme/Allergies: Positive for seasonal allergies.  Psychiatric/Behavioral: Negative for depression, suicidal ideas and hallucinations.  positive for anxiety All other systems reviewed and are negative.   Physical Exam: Vitals:   08/26/15 1030  BP: (!) 144/80  Pulse: 84   Body mass index is 29.01 kg/m. Gen:      No acute distress HEENT:  EOMI, sclera anicteric Neck:     No masses; no thyromegaly Lungs:    Clear to auscultation bilaterally; normal respiratory effort CV:         Regular rate and rhythm; no murmurs Abd:      + bowel sounds; soft, non-tender; no palpable masses, no distension Ext:    No edema; adequate peripheral perfusion Skin:      Warm and dry; no rash Neuro: alert and oriented x 3 Psych: normal mood and affect  Data Reviewed:  Reviewed chart in epic   Assessment and Plan/Recommendations:  60 year old female with history of fibromyalgia, irritable bowel syndrome predominant constipation here for follow-up visit Increase dose of Linzess to 145mg  daily Advised patient to  increase fluid intake Start probiotic, align one capsule daily  25 minutes was spent face-to-face with the patient. Greater than 50% of the time used for counseling as well as treatment plan and follow-up. She had multiple questions which were answered to her satisfaction  K. Scherry RanVeena Nandigam , MD [phone] Mon-Fri 8a-5p [phone] after 5p, weekends, holidays  CC: Assunta FoundGolding, John, MD

## 2015-08-26 NOTE — Telephone Encounter (Signed)
Called and gave patient lab results, patient had no questions or concerns at this time.

## 2015-08-26 NOTE — Telephone Encounter (Signed)
No change in her pump settings for now.

## 2015-08-26 NOTE — Telephone Encounter (Signed)
Patient was sick last, b/s running high 230 yesterday morn,    271 lunch time,  Now 185  Please advise call anytime after 1:00

## 2015-08-26 NOTE — Telephone Encounter (Signed)
I have already spoke to the patient in person. She came back to the office and got 290 mcg linzess she said 145mcg didn't work for her. FYI     FYI Dr Lavon PaganiniNandigam

## 2015-08-27 NOTE — Telephone Encounter (Signed)
ok 

## 2015-08-29 NOTE — Telephone Encounter (Signed)
Excellent

## 2015-08-29 NOTE — Telephone Encounter (Signed)
Pt woke up this am and the sugar reading was 109 and yesterday it did not go over 138. This is the best its ever been so far

## 2015-09-03 ENCOUNTER — Other Ambulatory Visit: Payer: Self-pay

## 2015-09-03 MED ORDER — LINACLOTIDE 290 MCG PO CAPS: 290.0000 ug | ORAL_CAPSULE | Freq: Every day | ORAL | 4 refills | Status: DC

## 2015-09-09 ENCOUNTER — Telehealth: Payer: Self-pay

## 2015-09-09 ENCOUNTER — Other Ambulatory Visit: Payer: Self-pay

## 2015-09-09 ENCOUNTER — Telehealth: Payer: Self-pay | Admitting: Internal Medicine

## 2015-09-09 MED ORDER — GLUCOSE BLOOD VI STRP: ORAL_STRIP | 3 refills | Status: DC

## 2015-09-09 NOTE — Telephone Encounter (Signed)
Patient returned phone call, discussed questions with insulin, patient questioned increasing insulin; I advised patient her sugars are much better, and Dr.Gherghe had advised not to change insulin pump settings for now through previous messages, patient does have an appointment coming up soon which she states she will discuss with her then. Patient states her sugars have been much better only one in the 200's. I advised patient if she had multiple 200 sugar readings to call us back, patient acknowledged.

## 2015-09-09 NOTE — Telephone Encounter (Signed)
PT called and said she needs a refill of her test strips and that she is testing her sugar 6x daily instead of 4x daily.  She also wanted a call back, she has some questions about her insulin and Omni Pod.

## 2015-09-09 NOTE — Telephone Encounter (Signed)
Called and left message for patient to return phone call to discuss pump and insulin questions. Gave call back number.

## 2015-09-20 ENCOUNTER — Telehealth: Payer: Self-pay | Admitting: Internal Medicine

## 2015-09-20 NOTE — Telephone Encounter (Signed)
[name], can you please check with her if the site is not occluded? Thank you, C

## 2015-09-20 NOTE — Telephone Encounter (Signed)
Pt calling to let us know the omnipod has been working well up until the last 24 hours her sugars have ranged between 200-250. Please advise

## 2015-09-21 NOTE — Telephone Encounter (Signed)
Patient reported that Her blood sugar this AM(7:30) was 340.  She took a correction bolus of 4.1u.  It is now 9AM and she has 3.75u on board.  She changed out her pod this AM, and I asked her to look at the canula on the pod.  She says it was bent. I explained that she was getting no insulin.  She denies that she got no "no delivery alarms" from the pump.   Blood sugar now at 9AM is 305 with 3.75u IOB.  She was told take another 2 units of insulin, explaining to her that she was getting no basal insulin. She will call me in 2 hours with another blood sugar reading.

## 2015-09-21 NOTE — Telephone Encounter (Signed)
Patient states her omni pod reads between 220 and 340,since yesterday please advise

## 2015-09-21 NOTE — Telephone Encounter (Signed)
Thank you, Linda.  

## 2015-09-21 NOTE — Telephone Encounter (Signed)
Phone call from patient.  Blood sugar was 224 at 11AM.  Pt. Reported that she looked at the last 2 pods she has removed, and they have all had a bend canula.  I suggested she use new sights for pod insertions, and gave her some more sites to put the pod.

## 2015-09-23 ENCOUNTER — Other Ambulatory Visit: Payer: Self-pay | Admitting: Internal Medicine

## 2015-09-23 ENCOUNTER — Other Ambulatory Visit: Payer: Self-pay

## 2015-09-23 ENCOUNTER — Telehealth: Payer: Self-pay

## 2015-09-23 ENCOUNTER — Telehealth: Payer: Self-pay | Admitting: Dietician

## 2015-09-23 DIAGNOSIS — E139 Other specified diabetes mellitus without complications: Secondary | ICD-10-CM

## 2015-09-23 MED ORDER — INSULIN SYRINGES (DISPOSABLE) U-100 1 ML MISC: 1 refills | Status: AC

## 2015-09-23 MED ORDER — INSULIN ASPART 100 UNIT/ML ~~LOC~~ SOLN: SUBCUTANEOUS | 3 refills | Status: DC

## 2015-09-23 NOTE — Telephone Encounter (Signed)
Spoke to patient advised of what to do until Tuesday.

## 2015-09-23 NOTE — Telephone Encounter (Signed)
Brief Nutrition Note:    Called patient to make appointments for this RD on 09/26/15 at 10:30 am at [facility] and Bonita QuinLinda, CDE Tuesday 9/26 at 2:30.  Patient to return our call Monday am to our office at [phone] to confirm.  Oran ReinLaura Joren Rehm, RD

## 2015-09-23 NOTE — Telephone Encounter (Signed)
I assume she already changed the pod. If not, please do this immediately as the cannula could have been bent again. If she already changed it, she may need to take injections: - Mealtime insulin: 1 unit for every 5 g of carbs - + Sliding scale as needed: Target 120, insulin sensitivity factor 50 121-170: +1 unit 171-220: +2 units 221-270: +3 units, etc. She may also need to come to see Bonita QuinLinda on Monday.

## 2015-09-23 NOTE — Telephone Encounter (Signed)
Patient called and states that her blood sugars today have been running high again.   At breakfast 242 Before lunch 296 At 3:15- 333 And now at 3:45- 314.  Patient states that she is doing everything that she knows to do for the omnipod. She stated that [name] advised her to take extra injections when they get that high, but patient does not know how many carbs to say she has ate to get the extra dosage. Please advise, patient was very upset. Thank you!

## 2015-09-26 ENCOUNTER — Encounter: Attending: Internal Medicine | Admitting: Dietician

## 2015-09-26 ENCOUNTER — Telehealth: Payer: Self-pay | Admitting: Dietician

## 2015-09-26 DIAGNOSIS — Z713 Dietary counseling and surveillance: Secondary | ICD-10-CM | POA: Insufficient documentation

## 2015-09-26 DIAGNOSIS — E139 Other specified diabetes mellitus without complications: Secondary | ICD-10-CM | POA: Diagnosis present

## 2015-09-26 NOTE — Patient Instructions (Signed)
Continue to practice carbohydrate counting with each meal. You are doing a great job!  When you have to give a shot (when the pump does not work) give 1 unit of insulin for each 5 grams of carbohydrate.  Plus give insulin to cover a high blood sugar according to your scale.  Continue testing your blood sugar before breakfast, lunch, dinner, and before bed or if you are not feeling good.  Consider walking again and other things you enjoy.

## 2015-09-27 ENCOUNTER — Encounter: Payer: Self-pay | Admitting: Dietician

## 2015-09-27 ENCOUNTER — Encounter: Admitting: Nutrition

## 2015-09-27 DIAGNOSIS — E139 Other specified diabetes mellitus without complications: Secondary | ICD-10-CM

## 2015-09-27 DIAGNOSIS — Z713 Dietary counseling and surveillance: Secondary | ICD-10-CM | POA: Diagnosis not present

## 2015-09-27 NOTE — Progress Notes (Signed)
Diabetes Self-Management Education  Visit Type: First/Initial  Appt. Start Time: 11 Appt. End Time: 1215  09/27/2015  [name], identified by name and date of birth, is a 60 y.o. female with a diagnosis of Diabetes: (P) Type 1 (LADA).   Other hx includes IBS constipation, depression, and psych admissions to [facility], [facility], and [facility] per patient.    [name] arrived with severe anxiety.  I called her earlier in the morning and she had thought of not coming to this appointment because of this.  She had several books with her including a meal plan book from the [employer].  She stated that her husband had given it to her and stated that this would "cure" her if she would just follow the meal plans.  She has been reading it and put together a 2 week meal plan and grocery list but stated, "All this food will go to waste because we can't eat all of this before it spoils and I don't even like all of these foods".  She reports that her husband is not supportive and that they do not have a positive relationship. She states that she has no support from anyone.  She sees the CDE frequently and calls her endocrinologist often.  She stated that she doesn't know what she is doing but was able to verbalize accurate answers when questions.  She maintained a negative attitude, "I am stupid", throughout.  I continued to remind her of what she verbalized knowing.  She sees a Therapist, sports but not a Veterinary surgeon.  She has been carbohydrate counting using a [employer] and books.  She demonstrated ability to do this.  Her insulin to carb ratio is 1:5 and she was using a correction scale for her blood sugar.  Currently she has not been able to use her pump due to concerns about the canula and will have this addressed with CDE 09/27/15.  She has been giving insulin injections.  She has been putting her CHO intake in the pump to figure out how much insulin to give.  I also taught her to  calculate this by hand and she was able to demonstrate multiple times.  Taught patient carb counting by portion size.  Discussed the meals that she usually eats (baked, simple) and she was able to demonstrate ability to meal plan and count carbohydrates using this method.  She also stated that she will continue to use the app and books when needed.    She was calmer upon finishing.  Provided her with counseling offices that are covered by her insurance and recommend that she inquire further regarding about this.  ASSESSMENT  Height 5\' 4"  (1.626 m), weight 167 lb (75.8 kg). Body mass index is 28.67 kg/m.      Diabetes Self-Management Education - 09/27/15 1302      Visit Information   Visit Type First/Initial     Initial Visit   Diabetes Type (P)  Type 1  LADA   Are you currently following a meal plan? (P)  Yes   What type of meal plan do you follow? (P)  --  counting carbohydrates   Are you taking your medications as prescribed? (P)  Yes     Health Coping   How would you rate your overall health? (P)  Poor     Psychosocial Assessment   Patient Belief/Attitude about Diabetes (P)  Afraid   Self-care barriers (P)  Other (comment)  extreme anxiety  Self-management support (P)  Doctor's office;CDE visits   Other persons present (P)  Patient   Patient Concerns (P)  Nutrition/Meal planning;Glycemic Control   Special Needs (P)  Other (comment)  slow, calm education with repetition and multiple teach back   Preferred Learning Style (P)  No preference indicated   Learning Readiness (P)  Change in progress   How often do you need to have someone help you when you read instructions, pamphlets, or other written materials from your doctor or pharmacy? (P)  2 - Rarely     Pre-Education Assessment   Patient understands the diabetes disease and treatment process. (P)  Needs Review   Patient understands incorporating nutritional management into lifestyle. (P)  Needs Review   Patient  undertands incorporating physical activity into lifestyle. (P)  Needs Review   Patient understands using medications safely. (P)  Needs Review   Patient understands monitoring blood glucose, interpreting and using results (P)  Needs Review   Patient understands prevention, detection, and treatment of acute complications. (P)  Needs Review   Patient understands prevention, detection, and treatment of chronic complications. (P)  Needs Review   Patient understands how to develop strategies to address psychosocial issues. (P)  Needs Instruction   Patient understands how to develop strategies to promote health/change behavior. (P)  Needs Instruction     Complications   How often do you check your blood sugar? (P)  > 4 times/day   Fasting Blood glucose range (mg/dL) (P)  696-295;284-132;>440130-179;180-200;>200   Postprandial Blood glucose range (mg/dL) (P)  102-725;>366180-200;>200   Number of hypoglycemic episodes per month (P)  0   Number of hyperglycemic episodes per week (P)  14      Individualized Plan for Diabetes Self-Management Training:   Learning Objective:  Patient will have a greater understanding of diabetes self-management. Patient education plan is to attend individual and/or group sessions per assessed needs and concerns.   Plan:   Patient Instructions  Continue to practice carbohydrate counting with each meal. You are doing a great job!  When you have to give a shot (when the pump does not work) give 1 unit of insulin for each 5 grams of carbohydrate.  Plus give insulin to cover a high blood sugar according to your scale.  Continue testing your blood sugar before breakfast, lunch, dinner, and before bed or if you are not feeling good.  Consider walking again and other things you enjoy.    Expected Outcomes:   Demonstrated interest but question long term outcome due to extreme anxiety.  Education material provided: Food label handouts and Meal plan card  If problems or questions, patient to  contact team via:  Phone and Email  Future DSME appointment:  prn.  She is to see CDE tomorrow.

## 2015-09-28 ENCOUNTER — Telehealth: Payer: Self-pay | Admitting: Nutrition

## 2015-09-28 NOTE — Telephone Encounter (Signed)
Can we decrease the ICR with dinner and postdinner snacks to 4.5? Or do we need to go to 4?

## 2015-09-28 NOTE — Telephone Encounter (Signed)
Her pump will not do 4.5, only 4.  I talked her through making the changes for this

## 2015-09-28 NOTE — Telephone Encounter (Signed)
Patient as you you to PLEASE call before you leave today.

## 2015-09-28 NOTE — Telephone Encounter (Signed)
Phone call to patient to see how she was doing today with the insulin pump.  shge tells me that she woke up twice during the night, and did 2 correction boluses and her FBS today was 90.  She says that her FBSs have been between 180-200 every morning.

## 2015-09-28 NOTE — Progress Notes (Signed)
Pt. Was having difficulty with bent cannulas last, and blood sugars were running high.  This week blood sugars are better, but high at HS.   After doing a diet history, it was found that she does not take insulin for snacks after supper, or during the afternoon.   Written instructions were given for her to do a bolus whenever eating, unless it is to treat a low blood sugar.  She says she understands this now, and no questions about this. We reviewed how to respond to alarms, and not to panic.  She agreed to read what the pump says to do.  She did a pod change without any help from me.  She was praised for this and said she just gets panicy, and has trouble understanding what to do next.  I told her to call the 800 telephone number on the back of the PDM for assistance when she gets like that.  She agreed to do this.  She reported no difficulty counting carbs, and I reviewed her diet history, and she appears to be doing this correctly. We also reviewed how to give a correction bolus, and she reported good understanding of this.   She had no final questions.

## 2015-09-29 ENCOUNTER — Encounter (HOSPITAL_COMMUNITY): Payer: Self-pay | Admitting: Psychiatry

## 2015-09-29 ENCOUNTER — Ambulatory Visit (INDEPENDENT_AMBULATORY_CARE_PROVIDER_SITE_OTHER): Admitting: Psychiatry

## 2015-09-29 DIAGNOSIS — F3131 Bipolar disorder, current episode depressed, mild: Secondary | ICD-10-CM | POA: Diagnosis not present

## 2015-09-29 MED ORDER — FLUOXETINE HCL 40 MG PO CAPS: 40.0000 mg | ORAL_CAPSULE | Freq: Every day | ORAL | 0 refills | Status: DC

## 2015-09-29 MED ORDER — CLONAZEPAM 1 MG PO TABS: ORAL_TABLET | ORAL | 2 refills | Status: DC

## 2015-09-29 MED ORDER — LAMOTRIGINE 200 MG PO TABS: 200.0000 mg | ORAL_TABLET | Freq: Every day | ORAL | 0 refills | Status: DC

## 2015-09-29 NOTE — Progress Notes (Signed)
[facility] [id number] Progress Note  [name] [id number] 60 y.o.  09/29/2015 3:23 PM  Chief Complaint:  Medication management and follow-up.           History of Present Illness:  [name] came for her follow-up appointment.  She is taking her medication but continues to have chronic complain of panic attacks and nervousness.  Lately she is calling her diabetic nurse because she is frustrated with insulin dosage.  Which she believed finally she got to the right dose.  She is also upset on her husband who is controlling on monthly checks and she has to request money for certain things.  She is taking Prozac, Lamictal and Klonopin .  She is take Klonopin 1-3 tablet as needed.  She does not want to change her benzodiazepine.  We have offered counseling but she does not afford counseling at this time.  She endorse chronic financial issues.  She is relieved that finally her debt collection issue is resolved.  She is no longer crying spells.  She has no tremors, shakes, rash or any itching.  Her appetite is fair.  Her sleep is okay.  She is able to sleep 4-6 hours with Klonopin.  Patient denies any paranoia, aggressiveness or any suicidal thoughts.  She has no tremors, shakes or any EPS.  Her appetite is okay.  She is happy that she lost 3 pounds from the last visit.  Suicidal Ideation: No Plan Formed: No Patient has means to carry out plan: No  Homicidal Ideation: No Plan Formed: No Patient has means to carry out plan: No  Past Psychiatric History/Hospitalization(s): Patient  started seeing psychiatrist  Dr. Elna Breslow at age 45 because of poor impulse control and irritability.  She then seen Dr. Tomasa Rand for past 15 years.  In the past she had tried lithium but she developed 2 times lithium toxicity and she is very afraid of lithium.  She had tried Seroquel, Abilify, Depakote, Lexapro, Zoloft, Paxil, Ativan and Risperdal. Recently we tried trazodone but she did not like side effects.  Patient endorse history of mania and severe mood swings.  She has one psychiatric hospitalization many years ago due to manic episode.  She denies any history of suicidal attempt. Anxiety: Yes Bipolar Disorder: Yes Depression: Yes Mania: Yes Psychosis: No Schizophrenia: No Personality Disorder: No Hospitalization for psychiatric illness: Yes History of Electroconvulsive Shock Therapy: No Prior Suicide Attempts: No  Medical History; Patient has diabetes mellitus with neuropathy, IBS, hypertension, hyperlipidemia. Her primary care physician is Sharlet Salina may at [facility].  Patient denies any history of traumatic brain injury, seizures or headaches.  Family History; Patient endorse her mother has mania.  Her aunt has psychiatric illness however she does not know if to take any medication.  Review of Systems  Musculoskeletal: Positive for joint pain.  Skin: Negative for itching and rash.  Neurological:       Nephropathy  Psychiatric/Behavioral: Positive for depression. The patient is nervous/anxious and has insomnia.        Forgetful and memory impairment    Psychiatric: Agitation: No Hallucination: No Depressed Mood: Yes Insomnia: Yes Hypersomnia: No Altered Concentration: No Feels Worthless: Yes Grandiose Ideas: No Belief In Special Powers: No New/Increased Substance Abuse: No Compulsions: No  Neurologic: Headache: No Seizure: No Paresthesias: Patient has neuropathy pain   Outpatient Encounter Prescriptions as of 09/29/2015  Medication Sig  . AMBULATORY NON FORMULARY MEDICATION Squatty Potty x 1  . amLODipine (NORVASC) 5 MG tablet Take 5 mg  by mouth daily.  . BD PEN NEEDLE NANO U/F 32G X 4 MM MISC   . BENICAR 40 MG tablet   . clonazePAM (KLONOPIN) 1 MG tablet Take 1 tab three times a day as needed for anxiety  . FLUoxetine (PROZAC) 40 MG capsule Take 1 capsule (40 mg total) by mouth daily.  Marland Kitchen. gabapentin (NEURONTIN) 800 MG tablet Take 800 mg by mouth. 1 tablet in the  morning and 2 tablets at bedtime.  Marland Kitchen. glucose blood (FREESTYLE LITE) test strip Check sugars 6x a day. Dx code: E13.9  . insulin aspart (NOVOLOG) 100 UNIT/ML injection Use up to 60 units daily in the insulin pump  . Insulin Syringes, Disposable, U-100 1 ML MISC To use for insulin injection..  . lamoTRIgine (LAMICTAL) 200 MG tablet Take 1 tablet (200 mg total) by mouth daily.  . Lancets (FREESTYLE) lancets Use to test blood sugar 4 to 6 times daily as instructed.  . linaclotide (LINZESS) 290 MCG CAPS capsule Take 1 capsule (290 mcg total) by mouth daily before breakfast.  . lubiprostone (AMITIZA) 24 MCG capsule Take 1 capsule (24 mcg total) by mouth 2 (two) times daily with a meal. (Patient not taking: Reported on 09/26/2015)  . metFORMIN (GLUCOPHAGE) 500 MG tablet TAKE 1 TABLET TWICE A DAY WITH MEALS (Patient not taking: Reported on 09/26/2015)  . NUCYNTA ER 200 MG TB12   . omeprazole (PRILOSEC) 40 MG capsule Take 1 capsule (40 mg total) by mouth 2 (two) times daily.  . ondansetron (ZOFRAN ODT) 4 MG disintegrating tablet Take 1 tablet (4 mg total) by mouth 3 (three) times daily with meals.  . [DISCONTINUED] clonazePAM (KLONOPIN) 1 MG tablet Take 1 tab three times a day as needed for anxiety  . [DISCONTINUED] FLUoxetine (PROZAC) 40 MG capsule Take 1 capsule (40 mg total) by mouth daily.  . [DISCONTINUED] lamoTRIgine (LAMICTAL) 200 MG tablet Take 1 tablet (200 mg total) by mouth daily.   No facility-administered encounter medications on file as of 09/29/2015.     Recent Results (from the past 2160 hour(s))  Fructosamine     Status: Abnormal   Collection Time: 08/24/15  9:43 AM  Result Value Ref Range   Fructosamine 342 (H) 190 - 270 umol/L  COMPLETE METABOLIC PANEL WITH GFR     Status: Abnormal   Collection Time: 08/24/15  9:43 AM  Result Value Ref Range   Sodium 139 135 - 146 mmol/L   Potassium 4.8 3.5 - 5.3 mmol/L   Chloride 102 98 - 110 mmol/L   CO2 25 20 - 31 mmol/L   Glucose, Bld 170  (H) 65 - 99 mg/dL   BUN 15 7 - 25 mg/dL   Creat 1.610.59 0.960.50 - 0.450.99 mg/dL    Comment:   For patients > or = 62 years of age: The upper reference limit for Creatinine is approximately 13% higher for people identified as African-American.      Total Bilirubin 0.4 0.2 - 1.2 mg/dL   Alkaline Phosphatase 78 33 - 130 U/L   AST 16 10 - 35 U/L   ALT 16 6 - 29 U/L   Total Protein 7.0 6.1 - 8.1 g/dL   Albumin 4.5 3.6 - 5.1 g/dL   Calcium 9.7 8.6 - 40.910.4 mg/dL   GFR, Est African American >89 >=60 mL/min   GFR, Est Non African American >89 >=60 mL/min  Lipid panel     Status: Abnormal   Collection Time: 08/24/15  9:43 AM  Result Value Ref  Range   Cholesterol 206 (H) 0 - 200 mg/dL    Comment: ATP III Classification       Desirable:  < 200 mg/dL               Borderline High:  200 - 239 mg/dL          High:  > = 161 mg/dL   Triglycerides 096.0 0.0 - 149.0 mg/dL    Comment: Normal:  <454 mg/dLBorderline High:  150 - 199 mg/dL   HDL 09.81 >19.14 mg/dL   VLDL 78.2 0.0 - 95.6 mg/dL   LDL Cholesterol 213 (H) 0 - 99 mg/dL   Total CHOL/HDL Ratio 4     Comment:                Men          Women1/2 Average Risk     3.4          3.3Average Risk          5.0          4.42X Average Risk          9.6          7.13X Average Risk          15.0          11.0                       NonHDL 154.86     Comment: NOTE:  Non-HDL goal should be 30 mg/dL higher than patient's LDL goal (i.e. LDL goal of < 70 mg/dL, would have non-HDL goal of < 100 mg/dL)  TSH     Status: None   Collection Time: 08/24/15  9:43 AM  Result Value Ref Range   TSH 0.81 0.35 - 4.50 uIU/mL      Constitutional:  BP (!) 144/85 (BP Location: Right Arm, Patient Position: Sitting, Cuff Size: Normal) Comment: Pt states she is upset.  Inform Dr. Lolly Mustache  Pulse 89   Resp 14   Ht 5\' 4"  (1.626 m)   Wt 165 lb 9.6 oz (75.1 kg)   BMI 28.43 kg/m    Musculoskeletal: Strength & Muscle Tone: within normal limits Gait & Station: normal Patient leans:  N/A  Psychiatric Specialty Exam: General Appearance: Casual  Eye Contact::  Fair  Speech:  Normal Rate  Volume:  Normal  Mood:  Anxious  Affect:  Labile  Thought Process:  Logical  Orientation:  Full (Time, Place, and Person)  Thought Content:  WDL  Suicidal Thoughts:  No  Homicidal Thoughts:  No  Memory:  Immediate;   Fair Recent;   Fair Remote;   Poor  Judgement:  Intact  Insight:  Fair  Psychomotor Activity:  Normal  Concentration:  Fair  Recall:  [employer] of Knowledge:  Fair  Language:  Fair  Akathisia:  No  Handed:  Right  AIMS (if indicated):     Assets:  Communication Skills Desire for Improvement Financial Resources/Insurance Housing Social Support  ADL's:  Intact  Cognition:  WNL  Sleep:        Established Problem, Stable/Improving (1), Review of Psycho-Social Stressors (1), Review of Last Therapy Session (1) and Review of Medication Regimen & Side Effects (2)  Assessment: Axis I: Bipolar disorder mildly depressed, anxiety disorder NOS  Axis II: Deferred  Axis III:  Past Medical History:  Diagnosis Date  . Bipolar 1 disorder (HCC)   . Breast density  06/25/2012   Right breast density, will get mammogram and [place]  . Depression   . Diabetes mellitus without complication (HCC)   . Duodenitis   . HTN (hypertension)   . Hyperlipidemia   . IBS (irritable bowel syndrome)     Plan:  Patient overall doing better.  She still have chronic anxiety and nervousness but she does not want to change her medication.  She is also taking gabapentin 800 mg 3 times a day prescribed by primary care physician.  I discuss medication side effects and benefits.  She wants to continue Lamictal 200 mg daily, Klonopin 1 mg 3 times a day and Prozac 40 mg daily.  Recommended to call us back if she has any question or any concern.  She has no rash itching or any headaches.  One more time I offered counseling but she declined.  Follow-up in 3 months.  Sabrina Plourde T.,  MD 09/29/2015         Patient ID: [name], female   DOB: 1955/03/17, 60 y.o.   MRN: [id number]

## 2015-10-03 ENCOUNTER — Telehealth: Payer: Self-pay | Admitting: Internal Medicine

## 2015-10-03 ENCOUNTER — Telehealth: Payer: Self-pay

## 2015-10-03 NOTE — Telephone Encounter (Signed)
Called and left message advising patient it was safe to wait until her appointment in 2 weeks with her sugars that way per Dr.Gherghe.

## 2015-10-03 NOTE — Telephone Encounter (Signed)
[facility] Call:  Caller states blood sugar is still 140-160 after basal rate increase.  Caller declines triage.  She has an appt in 2 weeks and needs to know if it is safe to wait to be seen until then.

## 2015-10-03 NOTE — Telephone Encounter (Signed)
Yes, that would be safe.

## 2015-10-04 ENCOUNTER — Telehealth: Payer: Self-pay | Admitting: Gastroenterology

## 2015-10-04 NOTE — Telephone Encounter (Signed)
Dr Lavon PaganiniNandigam, Patient stated that the 290 Linzess is not working for her, still constipated. Still taking Linzess every AM with morning meds. She wants to know if she can take the 290 twice a day Morning and night

## 2015-10-04 NOTE — Telephone Encounter (Signed)
Patient states that her intestines "don't feel good" after she uses the enemia laxative.

## 2015-10-04 NOTE — Telephone Encounter (Signed)
Left message to call.

## 2015-10-05 ENCOUNTER — Other Ambulatory Visit: Payer: Self-pay

## 2015-10-05 MED ORDER — INSULIN ASPART 100 UNIT/ML ~~LOC~~ SOLN: SUBCUTANEOUS | 3 refills | Status: DC

## 2015-10-10 ENCOUNTER — Telehealth: Payer: Self-pay | Admitting: Gastroenterology

## 2015-10-10 NOTE — Telephone Encounter (Signed)
Again she is concerned because she does not have daily bowel movements. Wants something stronger than Linzess. Discussed with her bowel habits, pelvic floor PT, fiber intake, daily exercise,over use of stimulant laxatives. She expresses feeling very depressed and not wanting to get out of bed for several days.  She decided she will take her Linzess in the morning at 6:00 am on an empty stomach by itself. She will try not to focus on having a daily bowel movement.

## 2015-10-11 ENCOUNTER — Telehealth: Payer: Self-pay | Admitting: Gastroenterology

## 2015-10-11 NOTE — Telephone Encounter (Signed)
Left a message to call 

## 2015-10-13 ENCOUNTER — Telehealth: Payer: Self-pay | Admitting: Gastroenterology

## 2015-10-13 NOTE — Telephone Encounter (Signed)
Left a message to call back.

## 2015-10-17 ENCOUNTER — Encounter: Payer: Self-pay | Admitting: Internal Medicine

## 2015-10-17 ENCOUNTER — Other Ambulatory Visit (HOSPITAL_COMMUNITY): Payer: Self-pay

## 2015-10-17 ENCOUNTER — Ambulatory Visit (INDEPENDENT_AMBULATORY_CARE_PROVIDER_SITE_OTHER): Admitting: Internal Medicine

## 2015-10-17 VITALS — BP 122/84 | HR 81 | Ht 65.0 in | Wt 165.0 lb

## 2015-10-17 DIAGNOSIS — E109 Type 1 diabetes mellitus without complications: Secondary | ICD-10-CM

## 2015-10-17 DIAGNOSIS — Z23 Encounter for immunization: Secondary | ICD-10-CM | POA: Diagnosis not present

## 2015-10-17 DIAGNOSIS — E139 Other specified diabetes mellitus without complications: Secondary | ICD-10-CM

## 2015-10-17 DIAGNOSIS — F3131 Bipolar disorder, current episode depressed, mild: Secondary | ICD-10-CM

## 2015-10-17 NOTE — Progress Notes (Unsigned)
Patient called and wanted her Klonopin sent to [facility]. [facility] I called [facility] and d/c'd the refills remaining and printed the new prescription out for Dr. Vanetta ShawlHisada to sign. This was faxed to [facility] and patient has a follow up in December.

## 2015-10-17 NOTE — Progress Notes (Signed)
Patient ID: [name], female   DOB: [birth year], 60 y.o.   MRN: [id number]  HPI: [name] is a 60 y.o.-year-old female, initially referred by her PCP, Dr. Sharilyn Sites (PA: Collene Mares), returning for f/u for LADA, dx 2005, insulin-dependent since ~2006, controlled, with complications (PN, gastroparesis?). Last visit 1.5 mo ago.  She started on an Omnipod insulin pump on 08/01/2015.  Previous fructosamine with corresponding HbA1c levels: 08/24/2015:  HbA1c calculated from fructosamine is 7.4%. 05/24/2015: HbA1c calculated from fructosamine is 6.9%. Office Visit on 11/23/2014  Component Date Value Ref Range HbA1c - calculated   F Fructosamine 02/23/2015  334* 190 - 270 umol/L 7.28%         . Fructosamine 11/23/2014 332* 190 - 270 umol/L 7.25%   . Fructosamine 09/01/2014 328* 190 - 270 umol/L 7.18%   . Fructosamine 04/20/2014 388* 190 - 270 umol/L 8.2%.   . Fructosamine- 10/29/2013 418* 190 - 270 umol/L 8.7%    Lab Results  Component Value Date   HGBA1C 4.6 07/16/2013   HGBA1C sent to [facility] 07/16/2013   HGBA1C 4.8 04/15/2012  - 02/12/2013: 4.2% - 12/19/2012: 5.2% - 09/18/2012: 6.5%  She is now on an insulin pump: Pump settings: - basal rates: 12 am - 4 pm >> 1.1 4 pm - 12 am >> 1.15 - ICR:  12 am - 4 pm: 5  - target: 120-120 - ISF:  12 am - 7 am: 60  - Insulin on Board: 4h - extended bolusing: not using - changes infusion site: q3 days - Meter: Omnipod  Also on: - Metformin 500 mg 2x a day   Pt checks her sugars 5-8 a day - will scan report: - am: 51x1, 130-168, 208 >> 87-227 >> 115, 154-246 >> 116-202, 229 >> 107-195, 202 - 2h after b'fast: 124, 194, 236 >> 59, 108-182, 237 >> 80-190 >> 92-236 >> 128-146 - before lunch: 34, 45, 59-189, 182 >> 49x 1 (delayed lunch), 105-153, 214 >> 89-166 >> 48, 71-154, 194 - 2h after lunch: 140-218 >> 68-155 >> 48, 297 >> 38, 92-150 >> 151-196 >> n/c >>84-162 - before dinner: 83-169, 221, 274 >> 60, 116-302 >>  91-210 >> 174-223, 295 >> 62-252, 297 - 2h after dinner: 160 >> 146 >> 178 >> 38, 50-194 >> 58x1, 67x1, 108-219 >> 107-277 >> 92-212 - bedtime: 83-203 >> 58, 81-200, 210, 251 >> 68, 99-231 >> 119-162 >> see above - at night: 41 x1, 75-202 >> 68-143, 172, 206, 298 >> 50, 57-244, 302 >> 141-185 >> 187 >> 111-169, 280 Has lows. Lowest sugar was 51 >> 34 and 38 >> 49 >> 89 >> 48; she has hypoglycemia awareness at 50. Highest sugar was 347 >> 302 >> 200s >> 295.  Pt's meals are: - Breakfast: toast, bowl of cereal + 2% milk  - Lunch: soup, sometimes sandwich, vegetables - Dinner: chicken/pork/beef + vegetable, no starch - Snacks: sugar free sweets  - no CKD, last BUN/creatinine:  Lab Results  Component Value Date   BUN 15 08/24/2015   CREATININE 0.59 08/24/2015  Received labs from PCP, from 08/31/2014: - CMP normal, except glucose 194, albumin 5.1 (3.6-4.8). BUN/creatinine 13/0.63, EGFR 97.On Losartan. - last set of lipids:  161/92/46/97 in 03/18/2013.  Lab Results  Component Value Date   CHOL 206 (H) 08/24/2015   HDL 51.60 08/24/2015   LDLCALC 133 (H) 08/24/2015   TRIG 109.0 08/24/2015   CHOLHDL 4 08/24/2015  Not on a statin.  - last eye exam  was in [place]. No DR. + small cataract. Office Visit on 02/23/2015  Component Date Value Ref Range Status  . HM Diabetic Eye Exam 12/15/2014 No Retinopathy  No Retinopathy Final   - + numbness and tingling in her feet. On Neurontin.  She is seeing a podiatrist [facility]).  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 She recently stopped [place]. Also: GERD, HTN, HL, Fibromyalgia >> on Gabapentin.  Last TSH: Lab Results  Component Value Date   TSH 0.81 08/24/2015   I reviewed pt's medications, allergies, PMH, social hx, family hx, and changes were documented in the history of present illness. Otherwise, unchanged from my initial visit note.    ROS: Constitutional: no weight gain, + fatigue, + hot flushes, + anxiety Eyes: no blurry vision, no xerophthalmia ENT: no sore throat, no nodules palpated in throat, no dysphagia/no odynophagia, no hoarseness Cardiovascular: no CP/SOB/palpitations/no leg swelling Respiratory: no cough/SOB Gastrointestinal: no N/V/D/+ C/no heartburn Musculoskeletal: no muscle/no joint aches Skin: no rashes,no hair loss Neurological: + tremors/no numbness/tingling/dizziness  PE: BP 122/84 (BP Location: Left Arm, Patient Position: Sitting)   Pulse 81   Ht '5\' 5"'  (1.651 m)   Wt 165 lb (74.8 kg)   SpO2 97%   BMI 27.46 kg/m    Wt Readings from Last 3 Encounters:  10/17/15 165 lb (74.8 kg)  09/26/15 167 lb (75.8 kg)  08/26/15 169 lb 0.2 oz (76.7 kg)   Constitutional: overweight, in NAd Eyes: PERRLA, EOMI, no exophthalmos ENT: moist mucous membranes, no thyromegaly, no cervical lymphadenopathy Cardiovascular: Tachycardia, RR, No MRG Respiratory: CTA B Gastrointestinal: abdomen soft, NT, ND, BS+ Musculoskeletal: no deformities, strength intact in all 4 Skin: moist, warm, no rashes Neurological: no tremor with outstretched hands, DTR normal in all 4  ASSESSMENT: 1. LADA, insulin-dependent, uncontrolled, with complications - peripheral neuropathy - gastroparesis?  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 She still has a positive C peptide >> still has insulin secretion. For this reason, we continued the Metformin for now. Welchol was started by PCP for cholesterol lowering, so we continued this, also.  Labs from 11/17/2013: - ACR 29.4 - CBC with differential normal, except eosinophiles 6% (0-5) - CMP normal, with a  BUN/creatinine ratio of 13/0.55, random glucose 73, GFR >89 - Lipids: 161/92/46/97 - TSH 1.918 - Urinalysis normal, without ketones or glucose  PLAN:  1. Patient with LADA, on basal-bolus + oral antidiabetic regimen, with better sugars after starting the insulin pump - now fluctuating w/o a pattern but overall better control - Her sugars are slow to come down after a correction >> will decrease the ISF to 50 - Patient is very constientious, writing down everything that she eats, calculating the amount of calories and protein with each meal. She is doing a wonderful job changing her pod frequently and is checking her sugars many times a day. - she appears more relaxed at this visit and feels the pump is helping with her DM-related anxiety -  I suggested to:  Patient Instructions  Please change the pump settings as follows: - basal rates: 12 am - 4 pm: 1.1 4 pm - 12 am: 1.15 - ICR:  12 am - 4 pm: 5 - target: 120-120 - ISF:  12 am - 7 am: 60 >> 50 - Insulin on Board: 4h  Please return in 1.5 months with your sugar log.   - continue checking sugars at different times of the day - check 3-4 times a day, rotating checks - UTD with eye exams - given flu shot today - Please return in 1.5 months    - time spent with the patient: 40 min, of which >50% was spent in reviewing her pump downloads, discussing her hypo- and hyper-glycemic episodes, reviewing previous labs and pump settings and developing a plan to avoid hypo- and hyper-glycemia.   Philemon Kingdom, MD PhD [facility]

## 2015-10-17 NOTE — Patient Instructions (Addendum)
Please change the pump settings as follows: - basal rates: 12 am - 4 pm: 1.1 4 pm - 12 am: 1.15 - ICR:  12 am - 4 pm: 5 - target: 120-120 - ISF:  12 am - 7 am: 60 >> 50 - Insulin on Board: 4h  Please return in 1.5 months with your sugar log.

## 2015-10-19 ENCOUNTER — Telehealth: Payer: Self-pay | Admitting: Gastroenterology

## 2015-10-19 ENCOUNTER — Other Ambulatory Visit (HOSPITAL_COMMUNITY): Payer: Self-pay

## 2015-10-19 DIAGNOSIS — F3131 Bipolar disorder, current episode depressed, mild: Secondary | ICD-10-CM

## 2015-10-19 MED ORDER — CLONAZEPAM 1 MG PO TABS: ORAL_TABLET | ORAL | 2 refills | Status: DC

## 2015-10-19 NOTE — Telephone Encounter (Signed)
Spoke with the patient. She wanted to report that she is feeling better and she plans to start moving around more and doing her PT exercises.

## 2015-10-26 ENCOUNTER — Telehealth: Payer: Self-pay

## 2015-10-26 ENCOUNTER — Telehealth: Payer: Self-pay | Admitting: Internal Medicine

## 2015-10-26 NOTE — Telephone Encounter (Signed)
Patient stated that her b/s is 300 now What should she do.

## 2015-10-26 NOTE — Telephone Encounter (Signed)
Called and spoke with patient, she does not understand how to change her basal rates on the pump. I spoke verbally with Dr.Gherghe and she advised her to come in and drop her pump off so she can fix it and give it back to her and see if that takes care of the issue until Bonita QuinLinda, RN comes back from vacation. Patient states she would.

## 2015-10-26 NOTE — Telephone Encounter (Signed)
Actually, the changes that we made were designed to give her slightly more insulin with a correction, and therefore should have been conducive to lower sugars, not higher... We can make the following changes in the basal rates for now: - basal rates: 12 am - 11:30 am: 1.1 >> 1.2 11:30 am - 4 pm: 1.1 4 pm - 12 am: 1.15 - ICR:  12 am - 4 pm: 5 - target: 120-120 - ISF:  12 am - 7 am: 50 - Insulin on Board: 4h

## 2015-10-26 NOTE — Telephone Encounter (Signed)
Patient stated that the changes that were made to insulin caused her b/s go up, the highest reading was 202.   this morning 190. Please advise

## 2015-10-31 NOTE — Telephone Encounter (Signed)
Patient is calling on the status of a fax. Please advise

## 2015-11-03 ENCOUNTER — Telehealth: Payer: Self-pay

## 2015-11-03 NOTE — Telephone Encounter (Signed)
Please stay hydrated and bolus insulin to correct the 300. Check infusion site, also. Please call with sugars in few days to see if there is a pattern.

## 2015-11-03 NOTE — Telephone Encounter (Signed)
Patient stated she have been really sick, nausea, having panic attacks diarrhea she ask could this becausing her b/s to run high? This morning it was over 3 hundred. Please advise.

## 2015-11-03 NOTE — Telephone Encounter (Signed)
Called patient and advised of what to do for the 300 blood sugars. Patient states she is having some mental issues at this time, and called her psychiatrist to help with that as she is having panic attacks. Patient advised to call back with sugars in a couple of days which she states she will.

## 2015-11-04 ENCOUNTER — Telehealth: Payer: Self-pay | Admitting: Internal Medicine

## 2015-11-04 ENCOUNTER — Other Ambulatory Visit: Payer: Self-pay

## 2015-11-04 ENCOUNTER — Telehealth: Payer: Self-pay | Admitting: Gastroenterology

## 2015-11-04 ENCOUNTER — Telehealth: Payer: Self-pay

## 2015-11-04 MED ORDER — GLUCOSE BLOOD VI STRP: ORAL_STRIP | 3 refills | Status: DC

## 2015-11-04 NOTE — Telephone Encounter (Signed)
Sabrina FanningJulie, can you please have her come and see Bonita QuinLinda on Monday?

## 2015-11-04 NOTE — Telephone Encounter (Signed)
BS has been ranging form 200-300 all night did drop one time to 52 at 230 am and by 630 am it was back up to 259  She has nausea

## 2015-11-04 NOTE — Telephone Encounter (Signed)
Sent in rx for patient test strips for more strips. Making patient an appointment with Bonita QuinLinda on Monday to check out her pump settings.

## 2015-11-04 NOTE — Telephone Encounter (Signed)
Due to the pt needing to test more frequently she is running out of test strips can we call [facility]

## 2015-11-04 NOTE — Telephone Encounter (Signed)
Pt called and said that for 24 hours her sugar was over 200 and now the last three times she has checked it it has been 139, 118, and 87.  She wants to know what is going on.

## 2015-11-04 NOTE — Telephone Encounter (Signed)
I will not be able to make comments based on individual blood sugars in the future. Please let me know if she consistently see patterns. For now, let's continue with the plan to see [name], as discussed.

## 2015-11-04 NOTE — Telephone Encounter (Signed)
L/M for patient that she has 4 refills on her prescription that was sent in in September

## 2015-11-04 NOTE — Telephone Encounter (Signed)
Attempted to contact patient to see what is going on with her sugars. I left message advising to see Bonita QuinLinda on Tuesday, but I was messaging patient sugars to Dr.Gherghe from this afternoon and would contact her if any changes needed to be made.

## 2015-11-10 MED ORDER — GLUCOSE BLOOD VI STRP: ORAL_STRIP | 2 refills | Status: DC

## 2015-11-10 NOTE — Telephone Encounter (Signed)
Refill submitted and patient advised. Patient voiced understanding.

## 2015-11-10 NOTE — Telephone Encounter (Signed)
Patient calling upset asking if you could call Free style test strips, [facility]. please

## 2015-11-14 NOTE — Telephone Encounter (Signed)
Patient ask why did Gherghe take the quantity down for her glucose blood (FREESTYLE LITE) test strip.Marland Kitchen. Please advise

## 2015-11-14 NOTE — Telephone Encounter (Signed)
I contacted the patient and left a voicemail advising the patient via voicemail on 11/10/2015 Dr. Elvera LennoxGherghe had sent 600 test strips. I advised the patient to call back and advise us what the issue with the quantity is.

## 2015-11-15 ENCOUNTER — Encounter: Attending: Internal Medicine | Admitting: Nutrition

## 2015-11-15 DIAGNOSIS — Z713 Dietary counseling and surveillance: Secondary | ICD-10-CM | POA: Diagnosis not present

## 2015-11-15 DIAGNOSIS — E139 Other specified diabetes mellitus without complications: Secondary | ICD-10-CM | POA: Insufficient documentation

## 2015-11-16 NOTE — Telephone Encounter (Signed)
Message left on machine to call me back.  Told her that if this is a pump problem, to call the 800 number on the back of the pdm.

## 2015-11-18 NOTE — Patient Instructions (Addendum)
Add one extra unit of insulin to supper meal.   Call in one week with blood sugar readings.

## 2015-11-18 NOTE — Progress Notes (Addendum)
Patient very stressed saying that her blood sugars "are always high".  Meter download shows high blood sugars at HS 5 of 7 days, and then after a correction, she is high in the AM.  Her carb counting, appears to be accurate.  Her I/C ratio is 5, and she is eating approx 60 carbs acS.  I did not want to change this to 4, because of possible lows, so she was told to add 1 extra unit to total bolus acs.  She agreed to do this.  I also told her to call me in one week. If FBSs are still high, I will change her basal rate, starting at HS.  Pt. Also reports many bent cannulas.  In reviewing the download, she goes more than 24 hours with high readings, into the 300s.  We reviewed the high blood sugar protocol.  Written instructions at her request were given, that if the reading does not go down after one correction--and goes higher, to change the pod.  We also reviewed the need to change sites, and to get a magnifying mirror, to check that the cannula is in place after she inserts this.  She agreed to do this.

## 2015-11-23 ENCOUNTER — Telehealth: Payer: Self-pay | Admitting: Internal Medicine

## 2015-11-23 NOTE — Telephone Encounter (Signed)
Pt called and said that her levels have been all over the place.  She mentioned that they seem to be higher in the mornings and have even been over 300 a couple of times.  She would like a call back from the nurse to discuss.

## 2015-11-23 NOTE — Telephone Encounter (Signed)
Thanks

## 2015-11-23 NOTE — Telephone Encounter (Signed)
I contacted the patient and she advised me toward the beginning of the month her blood sugars had been running in the high 200 to low 300s. Patient stated to me during this time she was not eating properly and has gotten back on track with her eating habits and her blood sugar this morning was 160. Patient stated she wanted to inform MD of this and would contact our office if her blood sugar starts to rise again.

## 2015-11-28 NOTE — Telephone Encounter (Signed)
FYI

## 2015-11-28 NOTE — Telephone Encounter (Signed)
Bonita QuinLinda, is there any way you can give her a call today to see what's going on? Thank you so much!

## 2015-11-28 NOTE — Telephone Encounter (Signed)
Pt BS are everywhere 11/24; 225; 225; 214 11/25 164; 366 11/26 175; 235; 220 11/27 405

## 2015-11-28 NOTE — Telephone Encounter (Signed)
"  I think the high blood sugars are all my fault".  I am very depressed  Had her test her blood sugar now (12:30PM),  Blood sugar was 105.  Said she did a correction dose at 7:30 this morning.  She ate Neale Burly[employer] crackers all during the night and did not bolus for it. Stressed the need to bolus whenever she is eating carbohydrates.  She reported good understanding of this.

## 2015-11-29 ENCOUNTER — Ambulatory Visit: Admitting: Dietician

## 2015-11-30 ENCOUNTER — Telehealth (HOSPITAL_COMMUNITY): Payer: Self-pay

## 2015-11-30 NOTE — Telephone Encounter (Signed)
Medication mangement - Telephone call with patient after her two calls stating increased problems of nausea and reported she had been checked out by her GI provider and they suggested it was due to increased anxiety. Patient questions if any med changes are warranted and questions if should have increased medication for anxiety.  Agreed to send to Dr. Lolly MustacheArfeen for review and patient to return 01/05/16.

## 2015-12-02 ENCOUNTER — Telehealth (HOSPITAL_COMMUNITY): Payer: Self-pay

## 2015-12-02 NOTE — Telephone Encounter (Signed)
Medication management - Telephone call with patient to inform Dr. Lolly MustacheArfeen was not willing to change anything on her medications at this time. Patient stated she was not happy with this and may find a new provider.

## 2015-12-08 ENCOUNTER — Ambulatory Visit (HOSPITAL_COMMUNITY): Payer: Self-pay | Admitting: Psychiatry

## 2015-12-09 ENCOUNTER — Ambulatory Visit: Admitting: Internal Medicine

## 2015-12-12 ENCOUNTER — Ambulatory Visit (INDEPENDENT_AMBULATORY_CARE_PROVIDER_SITE_OTHER): Admitting: Psychiatry

## 2015-12-12 ENCOUNTER — Ambulatory Visit: Admitting: Internal Medicine

## 2015-12-12 ENCOUNTER — Telehealth: Payer: Self-pay | Admitting: Internal Medicine

## 2015-12-12 ENCOUNTER — Encounter (HOSPITAL_COMMUNITY): Payer: Self-pay | Admitting: Psychiatry

## 2015-12-12 VITALS — BP 134/78 | HR 89 | Ht 64.0 in | Wt 169.2 lb

## 2015-12-12 DIAGNOSIS — F3131 Bipolar disorder, current episode depressed, mild: Secondary | ICD-10-CM | POA: Diagnosis not present

## 2015-12-12 DIAGNOSIS — Z79899 Other long term (current) drug therapy: Secondary | ICD-10-CM | POA: Diagnosis not present

## 2015-12-12 DIAGNOSIS — F411 Generalized anxiety disorder: Secondary | ICD-10-CM | POA: Diagnosis not present

## 2015-12-12 DIAGNOSIS — Z888 Allergy status to other drugs, medicaments and biological substances status: Secondary | ICD-10-CM

## 2015-12-12 DIAGNOSIS — Z8489 Family history of other specified conditions: Secondary | ICD-10-CM

## 2015-12-12 DIAGNOSIS — Z8249 Family history of ischemic heart disease and other diseases of the circulatory system: Secondary | ICD-10-CM | POA: Diagnosis not present

## 2015-12-12 DIAGNOSIS — Z800 Family history of malignant neoplasm of digestive organs: Secondary | ICD-10-CM

## 2015-12-12 DIAGNOSIS — Z794 Long term (current) use of insulin: Secondary | ICD-10-CM

## 2015-12-12 DIAGNOSIS — Z818 Family history of other mental and behavioral disorders: Secondary | ICD-10-CM

## 2015-12-12 DIAGNOSIS — Z841 Family history of disorders of kidney and ureter: Secondary | ICD-10-CM

## 2015-12-12 DIAGNOSIS — Z8371 Family history of colonic polyps: Secondary | ICD-10-CM

## 2015-12-12 MED ORDER — DIAZEPAM 5 MG PO TABS: 5.0000 mg | ORAL_TABLET | Freq: Three times a day (TID) | ORAL | 0 refills | Status: DC

## 2015-12-12 MED ORDER — FLUOXETINE HCL 40 MG PO CAPS: 40.0000 mg | ORAL_CAPSULE | Freq: Every day | ORAL | 0 refills | Status: DC

## 2015-12-12 MED ORDER — LAMOTRIGINE 200 MG PO TABS: 200.0000 mg | ORAL_TABLET | Freq: Every day | ORAL | 0 refills | Status: DC

## 2015-12-12 MED ORDER — HYDROXYZINE PAMOATE 25 MG PO CAPS: ORAL_CAPSULE | ORAL | 0 refills | Status: DC

## 2015-12-12 NOTE — Progress Notes (Signed)
BH MD/PA/NP OP Progress Note  12/12/2015 1:39 PM [name]  MRN:  [id number]  Chief Complaint:  Chief Complaint    Follow-up; Anxiety     Subjective:  I have a lot of anxiety and nervousness.  I'm afraid I'm in a die with high blood sugar.  HPI: [name] came for her follow-up appointment.  She is experiencing increased anxiety and nervousness.  She is calling her diabetic nurse that he frequently because her blood sugar is very variable.  She admitted noncompliant with her died last month because she was under a lot of stress.  But lately she is watching her diet but her blood sugar remains very high.  She had called few times and she had a schedule appointment with her physician tomorrow.  She endorse chronic anxiety but lately it has been increase.  She admitted some time crying spells, frustration, irritability but denies any mania or any psychosis.  She is compliant with Prozac, Klonopin and Lamictal.  She does not feel Klonopin is working and she like to try a different medication.  She is sleeping 4-6 hours.  Her appetite is fair.  She has no tremors, shakes or any rash.  She has chronic issues with her husband who is very controlling.  We have offered counseling but due to financial reason she cannot afford counseling at this time.  She admitted crying spells, feeling hopeless and some time helpless but denies any active or passive suicidal thoughts or homicidal thought.  Her other issue is to visit her daughter who lives in [place].  She is not sure if she can travel that far but she like to spend Christmas with her.  Her appetite is okay.  Her energy level is fair.  Her vital signs are stable.  Visit Diagnosis:    ICD-9-CM ICD-10-CM   1. Bipolar affective disorder, currently depressed, mild (HCC) 296.51 F31.31 lamoTRIgine (LAMICTAL) 200 MG tablet     FLUoxetine (PROZAC) 40 MG capsule     hydrOXYzine (VISTARIL) 25 MG capsule     diazepam (VALIUM) 5 MG tablet  2. Generalized  anxiety disorder 300.02 F41.1 hydrOXYzine (VISTARIL) 25 MG capsule     diazepam (VALIUM) 5 MG tablet    Past Psychiatric History: Patient is started seeing psychiatrist Elna Breslow at age 60 because of poor impulse control, mania, irritability and mood swing.  Then she see Dr. Tomasa Rand for more than 15 years.  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  She also had tried Seroquel, Abilify, Depakote, Lexapro, Zoloft, Paxil, Ativan and Risperdal.  We also tried trazodone but she did not like the side effects.  Patient endorse history of mania and severe mood swings.  She has one psychiatric hospitalization due to mania.  Patient denies any history of suicidal attempt.  Past Medical History:  Patient see Dr. Lafe Garin.  She denies any history of traumatic brain injury, seizures or headaches.  Her primary care physician is Dr. Sharlet Salina. Past Medical History:  Diagnosis Date  . Bipolar 1 disorder (HCC)   . Breast density 06/25/2012   Right breast density, will get mammogram and [place]  . Depression   . Diabetes mellitus without complication (HCC)   . Duodenitis   . HTN (hypertension)   . Hyperlipidemia   . IBS (irritable bowel syndrome)     Past Surgical History:  Procedure Laterality Date  . ABDOMINAL HYSTERECTOMY    . CHOLECYSTECTOMY    . COLONOSCOPY  Family Psychiatric History: Patient reported mother has mania and her aunt has psychiatric illness.  Family History:  Family History  Problem Relation Age of Onset  . Hypertension Mother   . Bipolar disorder Mother   . Heart disease Brother   . Thyroid disease Sister   . Hypertension Sister   . Bipolar disorder Sister   . Depression Father   . Bipolar disorder Daughter   . Bipolar disorder Maternal Aunt   . Colon cancer Neg Hx   . Colon polyps Neg Hx   . Esophageal cancer Neg Hx   . Kidney disease Neg Hx   . Gallbladder disease Neg Hx   . Diabetes Neg Hx     Social History:  Social History    Social History  . Marital status: Married    Spouse name: N/A  . Number of children: 1  . Years of education: N/A   Occupational History  . Disabled    Social History Main Topics  . Smoking status: Never Smoker  . Smokeless tobacco: Never Used  . Alcohol use No  . Drug use: No  . Sexual activity: Not Currently   Other Topics Concern  . None   Social History Narrative  . None    Allergies:  Allergies  Allergen Reactions  . Reglan [Metoclopramide] Other (See Comments)    Chest pains    No results found for this or any previous visit (from the past 2160 hour(s)).   Metabolic Disorder Labs: Lab Results  Component Value Date   HGBA1C 4.6 07/16/2013   MPG 85 07/16/2013   MPG 91 04/15/2012   No results found for: PROLACTIN Lab Results  Component Value Date   CHOL 206 (H) 08/24/2015   TRIG 109.0 08/24/2015   HDL 51.60 08/24/2015   CHOLHDL 4 08/24/2015   VLDL 21.8 08/24/2015   LDLCALC 133 (H) 08/24/2015   LDLCALC 98 09/01/2014     Current Medications: Current Outpatient Prescriptions  Medication Sig Dispense Refill  . AMBULATORY NON FORMULARY MEDICATION Squatty Potty x 1 1 each 0  . amLODipine (NORVASC) 5 MG tablet Take 5 mg by mouth daily.    . BD PEN NEEDLE NANO U/F 32G X 4 MM MISC     . BENICAR 40 MG tablet     . diazepam (VALIUM) 5 MG tablet Take 1 tablet (5 mg total) by mouth 3 (three) times daily. 990 tablet 0  . FLUoxetine (PROZAC) 40 MG capsule Take 1 capsule (40 mg total) by mouth daily. 90 capsule 0  . gabapentin (NEURONTIN) 800 MG tablet Take 800 mg by mouth. 1 tablet in the morning and 2 tablets at bedtime.    Marland Kitchen glucose blood (FREESTYLE LITE) test strip Use to check blood sugar 6 times per day. Dx Code E11.9 600 each 2  . hydrOXYzine (VISTARIL) 25 MG capsule Take 1-2 capsule as needed at bed time 60 capsule 0  . insulin aspart (NOVOLOG) 100 UNIT/ML injection Use up to 60 units daily in the insulin pump 60 mL 3  . Insulin Syringes, Disposable,  U-100 1 ML MISC To use for insulin injection.. 100 each 1  . lamoTRIgine (LAMICTAL) 200 MG tablet Take 1 tablet (200 mg total) by mouth daily. 90 tablet 0  . Lancets (FREESTYLE) lancets Use to test blood sugar 4 to 6 times daily as instructed. 600 each 1  . linaclotide (LINZESS) 290 MCG CAPS capsule Take 1 capsule (290 mcg total) by mouth daily before breakfast. 90 capsule 4  .  lubiprostone (AMITIZA) 24 MCG capsule Take 1 capsule (24 mcg total) by mouth 2 (two) times daily with a meal. 180 capsule 3  . metFORMIN (GLUCOPHAGE) 500 MG tablet TAKE 1 TABLET TWICE A DAY WITH MEALS 180 tablet 11  . NUCYNTA ER 200 MG TB12     . omeprazole (PRILOSEC) 40 MG capsule Take 1 capsule (40 mg total) by mouth 2 (two) times daily. 180 capsule 3  . ondansetron (ZOFRAN ODT) 4 MG disintegrating tablet Take 1 tablet (4 mg total) by mouth 3 (three) times daily with meals. 90 tablet 4   No current facility-administered medications for this visit.     Neurologic: Headache: No Seizure: No Paresthesias: Patient has neuropathy pain  Musculoskeletal: Strength & Muscle Tone: decreased Gait & Station: normal Patient leans: N/A  Psychiatric Specialty Exam: Review of Systems  Constitutional: Negative.   HENT: Negative.   Eyes: Negative.   Respiratory: Negative.   Cardiovascular: Negative.   Genitourinary: Negative.   Musculoskeletal: Positive for back pain and joint pain.  Skin: Negative.  Negative for itching and rash.  Neurological: Positive for tingling. Negative for tremors.       Neuropathy pain  Endo/Heme/Allergies: Negative.   Psychiatric/Behavioral: Positive for depression. The patient is nervous/anxious and has insomnia.     Blood pressure 134/78, pulse 89, height 5\' 4"  (1.626 m), weight 169 lb 3.2 oz (76.7 kg).Body mass index is 29.04 kg/m.  General Appearance: Casual  Eye Contact:  Fair  Speech:  Slow  Volume:  Normal  Mood:  Anxious and Depressed  Affect:  Constricted  Thought Process:  Goal  Directed  Orientation:  Full (Time, Place, and Person)  Thought Content: Rumination   Suicidal Thoughts:  No  Homicidal Thoughts:  No  Memory:  Immediate;   Fair Recent;   Fair Remote;   Fair  Judgement:  Fair  Insight:  Fair  Psychomotor Activity:  Normal  Concentration:  Concentration: Fair and Attention Span: Fair  Recall:  Fis[employer]Fair  Fund of Knowledge: Good  Language: Good  Akathisia:  No  Handed:  Right  AIMS (if indicated):  None reported   Assets:  Communication Skills Desire for Improvement Housing  ADL's:  Intact  Cognition: WNL  Sleep:  Fair      Assessment; Sabrina MccreedyBarbara 60 year old Caucasian married female who has history of bipolar disorder and anxiety disorder came for her follow-up appointment.    Plan; I review her symptoms, psychosocial stressors, current medication and recent visit to her primary care physician.  Patient continues to have struggled controlling her blood sugar.  She is very nervous and anxious about her blood sugar.  She is taking Klonopin but does not feel it is working.  I will discontinue Klonopin and we will try Valium 5 mg 3 times a day.  She has never tried Valium before.  I would also add Vistaril at bedtime to help her insomnia.  She does not want any medication that cause weight gain and increased blood sugar.  I will continue Lamictal 200 mg daily, Prozac 40 mg daily.  She has no tremors shakes or any EPS.  I offer counseling but patient declined due to financial reasons but promised once her financial situation improves that she will consider therapy.  Patient is taking gabapentin 800 mg from her primary care physician.  She is scheduled to see her physician tomorrow for her diabetic checkup.  Recommended to call us back if she has any question, concern if she feels worsening of the  symptom.  Follow-up in 4 weeks.  We will called value to her local pharmacy for 30 days.  Discussed benzodiazepine dependence, tolerance and withdrawal.  ARFEEN,SYED T.,  MD 12/12/2015, 1:39 PM

## 2015-12-12 NOTE — Telephone Encounter (Signed)
Let's have her back to see Bonita QuinLinda this week. We need her pump download to make further changes.

## 2015-12-12 NOTE — Telephone Encounter (Signed)
Pt called in today and said that last night at 5:30, 6:30 and 7:30, her sugars were over 300.  She said that she is testing more because she is nervous about her sugar levels.  She said this morning it is back down to 120 and it has jumped up to 188 as of now.  She said that no matter what she eats or does, her levels are all over the place.  Please advise.

## 2015-12-12 NOTE — Telephone Encounter (Signed)
Spoke to patient. Gave patient instructions per Dr. Charlean SanfilippoGherghe's previous note. Patient verbalized understanding. She was at another Dr.'s appt. Will call back to schedule appt for this week w/ Bonita QuinLinda.

## 2015-12-14 ENCOUNTER — Encounter: Payer: Self-pay | Admitting: Nutrition

## 2015-12-15 ENCOUNTER — Encounter: Payer: Self-pay | Admitting: Internal Medicine

## 2015-12-15 ENCOUNTER — Telehealth: Payer: Self-pay | Admitting: Internal Medicine

## 2015-12-15 ENCOUNTER — Ambulatory Visit (INDEPENDENT_AMBULATORY_CARE_PROVIDER_SITE_OTHER): Admitting: Internal Medicine

## 2015-12-15 VITALS — BP 164/74 | HR 78 | Ht 64.0 in | Wt 171.0 lb

## 2015-12-15 DIAGNOSIS — E139 Other specified diabetes mellitus without complications: Secondary | ICD-10-CM

## 2015-12-15 DIAGNOSIS — E109 Type 1 diabetes mellitus without complications: Secondary | ICD-10-CM | POA: Diagnosis not present

## 2015-12-15 NOTE — Telephone Encounter (Signed)
Patient need refill of glucose blood (FREESTYLE LITE) test strip insulin aspart (NOVOLOG) 100 UNIT/ML injection    Send to   [facility] [place] - [address] [phone] (Phone) [phone] (Fax)

## 2015-12-15 NOTE — Progress Notes (Signed)
Patient ID: Paula ComptonBarbara L [place], female   DOB: 08/25/1955, 60 y.o.   MRN: [id number]  HPI: Paula ComptonBarbara L Wessler is a 60 y.o.-year-old female, initially referred by her PCP, Dr. Assunta FoundJohn Golding (PA: Lenise HeraldBenjamin Mann), returning for f/u for LADA, dx 2005, insulin-dependent since ~2006, controlled, with complications (PN, gastroparesis?). Last visit 2 mo ago.  She tells me she has had "terrible" depression x 1 mo: 11-12/2015. She ate poorly. She feels a little better, but is still depressed and cries in the office today. She denies any thoughts of hurting herself.   She is on an Omnipod insulin pump since 08/01/2015.  Previous fructosamine with corresponding HbA1c levels: 08/24/2015:  HbA1c calculated from fructosamine is 7.4%. 05/24/2015: HbA1c calculated from fructosamine is 6.9%. Office Visit on 11/23/2014  Component Date Value Ref Range HbA1c - calculated   F Fructosamine 02/23/2015  334* 190 - 270 umol/L 7.28%         . Fructosamine 11/23/2014 332* 190 - 270 umol/L 7.25%   . Fructosamine 09/01/2014 328* 190 - 270 umol/L 7.18%   . Fructosamine 04/20/2014 388* 190 - 270 umol/L 8.2%.   . Fructosamine- 10/29/2013 418* 190 - 270 umol/L 8.7%    Lab Results  Component Value Date   HGBA1C 4.6 07/16/2013   [facility] 07/16/2013   HGBA1C 4.8 04/15/2012  - 02/12/2013: 4.2% - 12/19/2012: 5.2% - 09/18/2012: 6.5%  She is now on an insulin pump: Pump settings: - basal rates: 12 am - 11:30 am: 1.2 11:30 am - 4 pm: 1.1 4 pm - 12 am: 1.15 - ICR:  12 am - 4 pm: 5 - target: 120-120 - ISF:  12 am - 7 am: 50 - Insulin on Board: 4h - extended bolusing: not using - changes infusion site: q3 days - Meter: Omnipod TDD basal: 26.6 units TDD bolus 19.5 units  Also on: - Metformin 500 mg 2x a day   Pt checks her sugars 7.6 a day - will scan report - ave for last 2 weeks: 233: - am:  87-227 >> 115, 154-246 >> 116-202, 229 >> 107-195, 202 >> 97-324 - 2h after b'fast: 59, 108-182, 237 >> 80-190 >>  92-236 >> 128-146 >> n/c - before lunch:49x 1 (delayed lunch), 105-153, 214 >> 89-166 >> 48, 71-154, 194 >> 90-373 - 2h after lunch: 68-155 >> 48, 297 >> 38, 92-150 >> 151-196 >> n/c >>84-162 >> 67-HI - before dinner:  60, 116-302 >> 91-210 >> 174-223, 295 >> 62-252, 297 >> 121-311 - 2h after dinner: 38, 50-194 >> 58x1, 67x1, 108-219 >> 107-277 >> 92-212 >> 211-365 - bedtime: 83-203 >> 58, 81-200, 210, 251 >> 68, 99-231 >> 119-162 >> see above - at night: 50, 57-244, 302 >> 141-185 >> 187 >> 111-169, 280 >> 179-320 Has lows. Lowest sugar was 51 >> 34 and 38 >> 49 >> 89 >> 48 >> 51; she has hypoglycemia awareness at 50. Highest sugar was 347 >> 302 >> 200s >> 295 >> HI x1.  Pt's meals are: - Breakfast: toast, bowl of cereal + 2% milk  - Lunch: soup, sometimes sandwich, vegetables - Dinner: chicken/pork/beef + vegetable, no starch - Snacks: sugar free sweets  - no CKD, last BUN/creatinine:  Lab Results  Component Value Date   BUN 15 08/24/2015   CREATININE 0.59 08/24/2015   - Lipids: Lab Results  Component Value Date   CHOL 206 (H) 08/24/2015   HDL 51.60 08/24/2015   LDLCALC 133 (H) 08/24/2015   TRIG 109.0 08/24/2015  CHOLHDL 4 08/24/2015  161/92/46/97 in 03/18/2013.  Not on a statin.   - [place] No DR. + small cataract. Office Visit on 02/23/2015  Component Date Value Ref Range Status  . HM Diabetic Eye Exam 12/15/2014 No Retinopathy  No Retinopathy Final   - + numbness and tingling in her feet. On Neurontin.  She is seeing a podiatrist [facility]  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Also: GERD, HTN, HL, Fibromyalgia >> on Gabapentin.  Last TSH: Lab Results  Component Value Date   TSH 0.81 08/24/2015   I reviewed pt's medications, allergies, PMH, social hx, family hx, and changes were documented in the history of present illness. Otherwise, unchanged from my  initial visit note.   ROS: Constitutional: no weight gain, + fatigue, + hot flushes, ++ anxiety and depression Eyes: no blurry vision, no xerophthalmia ENT: no sore throat, no nodules palpated in throat, no dysphagia/no odynophagia, no hoarseness Cardiovascular: no CP/SOB/palpitations/no leg swelling Respiratory: no cough/SOB Gastrointestinal: no N/V/D/+ C/no heartburn Musculoskeletal: no muscle/no joint aches Skin: no rashes,no hair loss Neurological: + tremors/no numbness/tingling/dizziness  PE: BP (!) 164/74 (BP Location: Left Arm, Patient Position: Sitting, Cuff Size: Large)   Pulse 78   Ht 5\' 4"  (1.626 m)   Wt 171 lb (77.6 kg)   SpO2 98%   BMI 29.35 kg/m    Wt Readings from Last 3 Encounters:  12/15/15 171 lb (77.6 kg)  10/17/15 165 lb (74.8 kg)  09/26/15 167 lb (75.8 kg)   Constitutional: overweight, in NAD Eyes: PERRLA, EOMI, no exophthalmos ENT: moist mucous membranes, no thyromegaly, no cervical lymphadenopathy Cardiovascular: Tachycardia, RR, No MRG Respiratory: CTA B Gastrointestinal: abdomen soft, NT, ND, BS+ Musculoskeletal: no deformities, strength intact in all 4 Skin: moist, warm, no rashes Neurological: no tremor with outstretched hands, DTR normal in all 4  ASSESSMENT: 1. LADA, insulin-dependent, uncontrolled, with complications - peripheral neuropathy - gastroparesis?  Component     Latest Ref Rng 07/16/2013  C-Peptide     0.80 - 3.90 ng/mL 1.29  Glucose     70 - 99 mg/dL 621183 (H)  Glutamic Acid Decarb Ab     <=1.0 U/mL 11.3 (H)  Pancreatic Islet Cell Antibody     <5 JDF Units 5 (A)  + anti-pancreatic antibodies >> LADA rather than type 2 DM. She still has a positive C peptide >> still has insulin secretion. For this reason, we continued the Metformin.  PLAN:  1. Patient with LADA, on basal-bolus + oral antidiabetic regimen, with initially better sugars after starting the insulin pump - now worse as her depression is also worse - Her sugars are  still slow to come down after a correction and she needs to do additional boluses >> will decrease the ISF to 40 - she still skips meals >> sugars may decrease too much (51) >> advised to not skip meals (especially b'fast and lunch) - as sugars almost uniformly high at night unless she corrects them >> will increase her basal rates and decrease her target - she is still not proficient in introducing the pump setting so I did this for her - she has erratic carb enters >> discussed about fixed mealtimes and to enter all carbs in the pump: meals and snacks - advised her to return to see the diabetes educator in 2-3 weeks. She has an appt tomorrow but I advised her  to move it to later. -  I suggested to:  Patient Instructions  Please change the pump settings as follows: - basal rates: 12 am - 11:30 am: 1.2 >> 1.3 11:30 am - 4 pm: 1.1 >> 1.2 4 pm - 12 am: 1.15 >> 1.25 - ICR:  12 am - 4 pm: 5 - target: 120-120 >> 110-110 - ISF:  12 am - 7 am: 50 >> 40 - Insulin on Board: 4h  Schedule an appt with Bonita Quin in 2-3 weeks from now.  Please return in 1.5 months with your sugar log.   - continue checking sugars at different times of the day - check at least 4 times a day, rotating checks - UTD with eye exams - given flu shot at last visit - Please return in 1.5 months    - time spent with the patient: 40 min, of which >50% was spent in reviewing her pump downloads (will scan reports), discussing her hypo- and hyper-glycemic episodes, reviewing previous labs and pump settings and developing a plan to avoid hypo- and hyper-glycemia.   Office Visit on 12/15/2015  Component Date Value Ref Range Status  . Fructosamine 12/19/2015 401* 190 - 270 umol/L Final   HbA1c calculated from the fructosamine is higher, at 8.4%.  Carlus Pavlov, MD PhD [facility]

## 2015-12-15 NOTE — Patient Instructions (Addendum)
Please change the pump settings as follows: - basal rates: 12 am - 11:30 am: 1.2 >> 1.3 11:30 am - 4 pm: 1.1 >> 1.2 4 pm - 12 am: 1.15 >> 1.25 - ICR:  12 am - 4 pm: 5 - target: 120-120 >> 110-110 - ISF:  12 am - 7 am: 50 >> 40 - Insulin on Board: 4h  Schedule an appt with Bonita QuinLinda in 2-3 weeks from now.  Please return in 1.5 months with your sugar log.

## 2015-12-16 MED ORDER — INSULIN ASPART 100 UNIT/ML ~~LOC~~ SOLN: SUBCUTANEOUS | 3 refills | Status: DC

## 2015-12-16 MED ORDER — GLUCOSE BLOOD VI STRP: ORAL_STRIP | 2 refills | Status: DC

## 2015-12-16 NOTE — Telephone Encounter (Signed)
I contacted the patient and advised of message via voicemail. Requested a call back if the patient would like to discuss further.  

## 2015-12-16 NOTE — Telephone Encounter (Signed)
See message and please advise, Thanks!  

## 2015-12-16 NOTE — Telephone Encounter (Signed)
Please ask her to give it few days after the change in settings to start working.

## 2015-12-16 NOTE — Telephone Encounter (Signed)
Patient states that her b/s is still this morning it was 288, please advise

## 2015-12-19 ENCOUNTER — Telehealth: Payer: Self-pay | Admitting: Internal Medicine

## 2015-12-19 LAB — FRUCTOSAMINE: FRUCTOSAMINE: 401 umol/L — AB (ref 190–270)

## 2015-12-19 NOTE — Telephone Encounter (Signed)
Pt called in and said that on Friday she had two readings over 200, on Saturday she also had two readings over 200 and today she has had one over 200.  She said that she is using more insulin and pods due to the high numbers and she wants to know what Dr. Elvera [name] thinks.

## 2015-12-19 NOTE — Telephone Encounter (Signed)
Called patient. No answer. Left vmail.  

## 2015-12-19 NOTE — Telephone Encounter (Signed)
Patient returned call. Gave instructions per Dr. Luan PullingGerghe's previous note. Patient states she will need more insulin and test strips. Advised patient refills on file.

## 2015-12-19 NOTE — Telephone Encounter (Signed)
Yes, no problem, I am glad she is using more insulin and we can send more to her pharmacy she needs it.

## 2015-12-20 ENCOUNTER — Telehealth: Payer: Self-pay

## 2015-12-20 NOTE — Telephone Encounter (Signed)
Called patient. Gave lab results. Patient verbalized understanding.  

## 2015-12-20 NOTE — Telephone Encounter (Signed)
-----   Message from Carlus Pavlovristina Gherghe, MD sent at 12/19/2015  5:31 PM EST ----- Tad Mooreasandra, can you please call pt: HbA1c calculated from the fructosamine is higher, at 8.4%.

## 2015-12-30 NOTE — Telephone Encounter (Signed)
Patient stated her b/s has been over 200 for about a week. Please advise

## 2016-01-03 ENCOUNTER — Encounter: Payer: Self-pay | Admitting: Nutrition

## 2016-01-05 ENCOUNTER — Ambulatory Visit (HOSPITAL_COMMUNITY): Payer: Self-pay | Admitting: Psychiatry

## 2016-01-05 ENCOUNTER — Telehealth: Payer: Self-pay

## 2016-01-05 ENCOUNTER — Telehealth: Payer: Self-pay | Admitting: Internal Medicine

## 2016-01-05 NOTE — Telephone Encounter (Signed)
Please change the pump basal rates as follows: - basal rates: 12 am - 11:30 am: 1.3 >> 1.4 11:30 am - 4 pm: 1.2 >> 1.3 4 pm - 12 am: 1.25>> 1.35  Let's schedule her with [name] in 2 weeks or so.

## 2016-01-05 NOTE — Telephone Encounter (Signed)
Pt called in and is very worried because she cannot get a steady pattern established with her sugars, she said that last week her readings were above 300, and that yesterday at 3PM it was 203 and again at 10PM it was 268.  She wants to know if she is okay to wait until her next appointment with her sugars like this.

## 2016-01-05 NOTE — Telephone Encounter (Signed)
Ok, sure 

## 2016-01-05 NOTE — Telephone Encounter (Signed)
Called patient and advised of message, sent MD request from patient to fix her pump until she sees linda.

## 2016-01-06 ENCOUNTER — Telehealth: Payer: Self-pay

## 2016-01-06 NOTE — Telephone Encounter (Signed)
Patient stated b/s is running been over 300, she stated she is doing what she supposed to, please advise on what to do.

## 2016-01-06 NOTE — Telephone Encounter (Signed)
Called patient, notified her to come in to get her pump fixed. Patient agreed and will be in as soon as she can.

## 2016-01-09 ENCOUNTER — Telehealth: Payer: Self-pay | Admitting: Internal Medicine

## 2016-01-09 ENCOUNTER — Telehealth: Payer: Self-pay

## 2016-01-09 NOTE — Telephone Encounter (Signed)
Called and notified patient to change pod out, which she did. Sugars are down from this morning. Patient advised to call PCP with UTI issues.

## 2016-01-09 NOTE — Telephone Encounter (Signed)
Please advise. Does patient need to contact PCP? Thank you!

## 2016-01-09 NOTE — Telephone Encounter (Signed)
Pt called in and said that this morning her sugar was at 317 and that she thinks that her pod ran out over night because she heard a 'faint beeping' from her pump.  She would like a call back to know what she needs to do.

## 2016-01-09 NOTE — Telephone Encounter (Signed)
Pt has been urinating a lot and can she have a test for uti. Please advise

## 2016-01-09 NOTE — Telephone Encounter (Signed)
Checking with Dr.Gherghe about UTI issue.

## 2016-01-09 NOTE — Telephone Encounter (Signed)
Yes

## 2016-01-11 ENCOUNTER — Encounter: Attending: Internal Medicine | Admitting: Nutrition

## 2016-01-11 DIAGNOSIS — Z713 Dietary counseling and surveillance: Secondary | ICD-10-CM | POA: Diagnosis not present

## 2016-01-11 DIAGNOSIS — E139 Other specified diabetes mellitus without complications: Secondary | ICD-10-CM | POA: Diagnosis present

## 2016-01-11 NOTE — Progress Notes (Signed)
PDM download shows much variability in readings.  She is up late at night and does corrections at MN-2AM.  It appears she is counting carbs correctly, and putting them into the per per protocol.  Seh admits that at times, she puts carbs in, but does not eat them all--resulting in low blood sugars.  And once she took a bolus, and did not eat--with resulting 45 reading.  She admits that depression is making it difficult to eat /take insulin at appropriate times. .    Increased basal rate from 2AM to 10AM  By 0.1u decreased ISF from 40 to 35  Reviewed with her how to change basal rates and bolus calculations, using her booklet given with these specific instructions   I am not sure if she just wants attention, or if she is actually unable to comprehend how to make changes.   She promised to keep an accurate food record for me and return in one week for review of blood sugars/insulin doses.

## 2016-01-12 ENCOUNTER — Telehealth: Payer: Self-pay | Admitting: Nutrition

## 2016-01-12 ENCOUNTER — Telehealth: Payer: Self-pay | Admitting: Internal Medicine

## 2016-01-12 NOTE — Telephone Encounter (Signed)
See new telephone encounter today.

## 2016-01-12 NOTE — Telephone Encounter (Signed)
Glad to see that her sugars are improving.

## 2016-01-12 NOTE — Patient Instructions (Signed)
Keep food record for one week and return for review and evaluation.   Review manual on how to make changes to basal rates, and changes to other pump settings like carb ratio, and correction factors.  Call if questions.

## 2016-01-12 NOTE — Telephone Encounter (Signed)
Phone call to patient to see how she is doing today.  She is very upset saying that her blood sugar was over 300 last night and she fell asleep before checking it at bedtime, and this morning it was over 300.  She was in the process of changing her pod when I called her.  I talked her into finishing the pod change.   She was then told to test her blood sugar.  It is now 4674!.  Pt. Reports that she has 2u of IOB.  She was told to drink 4 ounces of orange juice and have her 1/2 cup of oatmeal as she ususually does.  I told her that I will call her in one hour to check on her.

## 2016-01-12 NOTE — Telephone Encounter (Signed)
Pt came yesterday and had the settings on her pump adjusted by Bonita QuinLinda. Pt did change her pod yesterday at 6 pm.   This AM she has in the span of 2 hours had high and inconsistent readings fasting  321 at 630 am 650 it was 410 655 min later it was 263 705 it was 256 730 it was 181 At 830 it was 99  Pt checked it a lot within 2 hours still has not eaten

## 2016-01-13 ENCOUNTER — Telehealth: Payer: Self-pay

## 2016-01-13 NOTE — Telephone Encounter (Signed)
Called and advised patient to do the 15 minute rule with orange juice, and advised to eat crackers to help with low blood sugars. Will mail patient handouts of what to do for high and low blood sugars.

## 2016-01-13 NOTE — Telephone Encounter (Signed)
What to eat when she have low sugar episodes, please advise

## 2016-01-16 ENCOUNTER — Encounter (HOSPITAL_COMMUNITY): Payer: Self-pay | Admitting: Psychiatry

## 2016-01-16 ENCOUNTER — Telehealth: Payer: Self-pay | Admitting: Internal Medicine

## 2016-01-16 ENCOUNTER — Ambulatory Visit (INDEPENDENT_AMBULATORY_CARE_PROVIDER_SITE_OTHER): Admitting: Psychiatry

## 2016-01-16 DIAGNOSIS — Z794 Long term (current) use of insulin: Secondary | ICD-10-CM

## 2016-01-16 DIAGNOSIS — Z818 Family history of other mental and behavioral disorders: Secondary | ICD-10-CM

## 2016-01-16 DIAGNOSIS — Z8249 Family history of ischemic heart disease and other diseases of the circulatory system: Secondary | ICD-10-CM | POA: Diagnosis not present

## 2016-01-16 DIAGNOSIS — F411 Generalized anxiety disorder: Secondary | ICD-10-CM | POA: Diagnosis not present

## 2016-01-16 DIAGNOSIS — Z79899 Other long term (current) drug therapy: Secondary | ICD-10-CM | POA: Diagnosis not present

## 2016-01-16 DIAGNOSIS — F3131 Bipolar disorder, current episode depressed, mild: Secondary | ICD-10-CM | POA: Diagnosis not present

## 2016-01-16 DIAGNOSIS — Z9889 Other specified postprocedural states: Secondary | ICD-10-CM

## 2016-01-16 DIAGNOSIS — Z8349 Family history of other endocrine, nutritional and metabolic diseases: Secondary | ICD-10-CM

## 2016-01-16 MED ORDER — FLUOXETINE HCL 40 MG PO CAPS: 40.0000 mg | ORAL_CAPSULE | Freq: Every day | ORAL | 0 refills | Status: DC

## 2016-01-16 MED ORDER — BUSPIRONE HCL 5 MG PO TABS: 30.0000 mg | ORAL_TABLET | Freq: Two times a day (BID) | ORAL | 0 refills | Status: DC

## 2016-01-16 MED ORDER — LAMOTRIGINE 200 MG PO TABS: 200.0000 mg | ORAL_TABLET | Freq: Every day | ORAL | 0 refills | Status: DC

## 2016-01-16 MED ORDER — DIAZEPAM 5 MG PO TABS: 5.0000 mg | ORAL_TABLET | Freq: Three times a day (TID) | ORAL | 0 refills | Status: DC

## 2016-01-16 NOTE — Progress Notes (Signed)
BH MD/PA/NP OP Progress Note  01/16/2016 1:24 PM [name]  MRN:  [id number]  Chief Complaint:  Chief Complaint    Follow-up; Anxiety     Subjective:  I am very upset because my blood sugar is high.  I asked my husband to take me the doctor but he refused.    HPI: [name] came for her follow-up appointment.  Today she is very upset because her blood sugar is 400 and she spoke to her husband to take her to the primary care physician but he refused and went to see his friends.  She admitted having crying because of the behavior of husband.  She also have difficulty controlling her blood sugar.  She is not sure what causing her blood sugar fluctuating.  She admitted some time very nervous, anxious and unable to relax.  Though she liked the Valium but she still feels nervous and anxious.  She is compliant with Prozac, Lamictal.  She has not taken Vistaril in a while.  She feel Vistaril doesn't help her.  She denies any mood swing or any mania.  She sleeping on and off because of the anxiety.  She admitted crying spells because of her husband's behavior.  She denies any suicidal thoughts or homicidal thought but feels some time hopeless.  She was sad because she could not visit her daughter in [place] at [place].  She was sick but she did not get any support from her husband.  Her appetite is okay.  While in the room she rechecked her blood sugar which came down to 280 and she were more relaxed.  Patient denies any hallucination or any paranoia.  Her energy level is fair.  Visit Diagnosis:    ICD-9-CM ICD-10-CM   1. Bipolar affective disorder, currently depressed, mild (HCC) 296.51 F31.31 lamoTRIgine (LAMICTAL) 200 MG tablet     FLUoxetine (PROZAC) 40 MG capsule     diazepam (VALIUM) 5 MG tablet  2. Generalized anxiety disorder 300.02 F41.1 diazepam (VALIUM) 5 MG tablet     busPIRone (BUSPAR) 5 MG tablet    Past Psychiatric History: Reviewed.  Past Medical History:  Past Medical  History:  Diagnosis Date  . Bipolar 1 disorder (HCC)   . Breast density 06/25/2012   Right breast density, will get mammogram and [place]  . Depression   . Diabetes mellitus without complication (HCC)   . Duodenitis   . HTN (hypertension)   . Hyperlipidemia   . IBS (irritable bowel syndrome)     Past Surgical History:  Procedure Laterality Date  . ABDOMINAL HYSTERECTOMY    . CHOLECYSTECTOMY    . COLONOSCOPY      Family Psychiatric History: Reviewed.  Family History:  Family History  Problem Relation Age of Onset  . Hypertension Mother   . Bipolar disorder Mother   . Heart disease Brother   . Thyroid disease Sister   . Hypertension Sister   . Bipolar disorder Sister   . Depression Father   . Bipolar disorder Daughter   . Bipolar disorder Maternal Aunt   . Colon cancer Neg Hx   . Colon polyps Neg Hx   . Esophageal cancer Neg Hx   . Kidney disease Neg Hx   . Gallbladder disease Neg Hx   . Diabetes Neg Hx     Social History:  Social History   Social History  . Marital status: Married    Spouse name: N/A  . Number of children: 1  . Years of  education: N/A   Occupational History  . Disabled    Social History Main Topics  . Smoking status: Never Smoker  . Smokeless tobacco: Never Used  . Alcohol use No  . Drug use: No  . Sexual activity: Not Currently   Other Topics Concern  . None   Social History Narrative  . None    Allergies:  Allergies  Allergen Reactions  . Reglan [Metoclopramide] Other (See Comments)    Chest pains    Metabolic Disorder Labs: Lab Results  Component Value Date   HGBA1C 4.6 07/16/2013   MPG 85 07/16/2013   MPG 91 04/15/2012   No results found for: PROLACTIN Lab Results  Component Value Date   CHOL 206 (H) 08/24/2015   TRIG 109.0 08/24/2015   HDL 51.60 08/24/2015   CHOLHDL 4 08/24/2015   VLDL 21.8 08/24/2015   LDLCALC 133 (H) 08/24/2015   LDLCALC 98 09/01/2014     Current Medications: Current Outpatient  Prescriptions  Medication Sig Dispense Refill  . AMBULATORY NON FORMULARY MEDICATION Squatty Potty x 1 1 each 0  . amLODipine (NORVASC) 5 MG tablet Take 5 mg by mouth daily.    . BD PEN NEEDLE NANO U/F 32G X 4 MM MISC     . BENICAR 40 MG tablet     . busPIRone (BUSPAR) 5 MG tablet Take 6 tablets (30 mg total) by mouth 2 (two) times daily. 180 tablet 0  . diazepam (VALIUM) 5 MG tablet Take 1 tablet (5 mg total) by mouth 3 (three) times daily. 270 tablet 0  . FLUoxetine (PROZAC) 40 MG capsule Take 1 capsule (40 mg total) by mouth daily. 90 capsule 0  . gabapentin (NEURONTIN) 800 MG tablet Take 800 mg by mouth. 1 tablet in the morning and 2 tablets at bedtime.    Marland Kitchen. glucose blood (FREESTYLE LITE) test strip Use to check blood sugar 6 times per day. Dx Code E11.9 600 each 2  . insulin aspart (NOVOLOG) 100 UNIT/ML injection Use up to 60 units daily in the insulin pump 60 mL 3  . Insulin Syringes, Disposable, U-100 1 ML MISC To use for insulin injection.. 100 each 1  . lamoTRIgine (LAMICTAL) 200 MG tablet Take 1 tablet (200 mg total) by mouth daily. 90 tablet 0  . Lancets (FREESTYLE) lancets Use to test blood sugar 4 to 6 times daily as instructed. 600 each 1  . linaclotide (LINZESS) 290 MCG CAPS capsule Take 1 capsule (290 mcg total) by mouth daily before breakfast. 90 capsule 4  . lubiprostone (AMITIZA) 24 MCG capsule Take 1 capsule (24 mcg total) by mouth 2 (two) times daily with a meal. (Patient not taking: Reported on 12/15/2015) 180 capsule 3  . metFORMIN (GLUCOPHAGE) 500 MG tablet TAKE 1 TABLET TWICE A DAY WITH MEALS 180 tablet 11  . NUCYNTA ER 200 MG TB12     . omeprazole (PRILOSEC) 40 MG capsule Take 1 capsule (40 mg total) by mouth 2 (two) times daily. 180 capsule 3  . ondansetron (ZOFRAN ODT) 4 MG disintegrating tablet Take 1 tablet (4 mg total) by mouth 3 (three) times daily with meals. 90 tablet 4   No current facility-administered medications for this visit.     Neurologic: Headache:  No Seizure: No Paresthesias: Patient has neuropathy pain.  Musculoskeletal: Strength & Muscle Tone: decreased Gait & Station: normal Patient leans: N/A  Psychiatric Specialty Exam: Review of Systems  Constitutional: Negative.   HENT: Negative.   Musculoskeletal: Positive for joint pain.  Skin: Negative.  Negative for itching and rash.  Neurological: Positive for tingling.  Psychiatric/Behavioral: The patient is nervous/anxious.     Blood pressure 130/82, pulse (!) 124, height 5\' 4"  (1.626 m), weight 170 lb (77.1 kg).Body mass index is 29.18 kg/m.  General Appearance: Fairly Groomed and Tearful  Eye Contact:  Good  Speech:  Clear and Coherent  Volume:  Normal  Mood:  Anxious  Affect:  Labile  Thought Process:  Goal Directed  Orientation:  Full (Time, Place, and Person)  Thought Content: Rumination   Suicidal Thoughts:  No  Homicidal Thoughts:  No  Memory:  Immediate;   Fair Recent;   Fair Remote;   Fair  Judgement:  Fair  Insight:  Fair  Psychomotor Activity:  Normal  Concentration:  Concentration: Good and Attention Span: Fair  Recall:  [employer] of Knowledge: Good  Language: Good  Akathisia:  No  Handed:  Right  AIMS (if indicated):  0  Assets:  Communication Skills Desire for Improvement Housing  ADL's:  Intact  Cognition: WNL  Sleep:  Adequate    Assessment: Bipolar disorder.  Anxiety disorder NOS.  Plan: Patient continues to have trouble controlling her blood sugar.  Anxiety is related to her blood sugar.  However she like Valium better than Klonopin.  I will continue Valium 5 mg 3 times a day as needed.  I would discontinue Vistaril since patient not taking regularly.  Continue Lamictal 200 mg daily and Prozac 40 mg daily.  I would also start BuSpar 5 mg twice a day to help anxiety symptoms.  Due to financial reasons patient does not want counseling at this time.  She admitted marital stress because he does not feel husband supported.  However there were  no physical or any violence .  I encouraged to continue trying to contact primary care physician and endocrinologist for the measurement of diabetes.  At this time she does not have any side effects including any tremors, shakes, rash or itching.  Recommended to call us back if she has any question, concern if she feels worsening of the symptom.  Discuss safety plan that anytime having active suicidal thoughts or homicidal thoughts and she need to call 911 or go to the local emergency room.  Follow-up in 3 months.  Mahitha Hickling T., MD 01/16/2016, 1:24 PM

## 2016-01-16 NOTE — Telephone Encounter (Signed)
[facility] Call: Caller is asking for information about what to do with high and low blood sugars.

## 2016-01-16 NOTE — Telephone Encounter (Signed)
Will mail out to patient to explain high and low blood sugars.

## 2016-01-17 ENCOUNTER — Other Ambulatory Visit: Payer: Self-pay | Admitting: Internal Medicine

## 2016-01-17 ENCOUNTER — Other Ambulatory Visit: Payer: Self-pay

## 2016-01-17 ENCOUNTER — Telehealth: Payer: Self-pay | Admitting: Internal Medicine

## 2016-01-17 ENCOUNTER — Encounter: Admitting: Nutrition

## 2016-01-17 ENCOUNTER — Telehealth: Payer: Self-pay | Admitting: Nutrition

## 2016-01-17 DIAGNOSIS — Z713 Dietary counseling and surveillance: Secondary | ICD-10-CM | POA: Diagnosis not present

## 2016-01-17 DIAGNOSIS — E139 Other specified diabetes mellitus without complications: Secondary | ICD-10-CM

## 2016-01-17 MED ORDER — INSULIN ASPART 100 UNIT/ML FLEXPEN: PEN_INJECTOR | SUBCUTANEOUS | 0 refills | Status: DC

## 2016-01-17 MED ORDER — INSULIN GLARGINE 100 UNIT/ML SOLOSTAR PEN: 32.0000 [IU] | PEN_INJECTOR | Freq: Every day | SUBCUTANEOUS | 1 refills | Status: DC

## 2016-01-17 NOTE — Telephone Encounter (Signed)
Patient spoke with Bonita QuinLinda, RN.

## 2016-01-17 NOTE — Telephone Encounter (Signed)
Patient is having problem with her pumps will some one please help her.she is panicked and crying asking you to call asap.

## 2016-01-17 NOTE — Telephone Encounter (Signed)
Spoke with Cristy FolksLinda Spagnola RN and will get patient in the office today.

## 2016-01-17 NOTE — Telephone Encounter (Signed)
11:00 AM:  Pt. Reported that she could not get a pod activated with calling the help line, and was still trying to put insulin into a pod that was deactivated.  She was told to bring the pods and PDM into the office now.

## 2016-01-17 NOTE — Telephone Encounter (Signed)
Patient is calling with questions about her meter please advise

## 2016-01-17 NOTE — Telephone Encounter (Signed)
Patient reported that she is haviing difficulty activating a pod.  I tried to talk her through the steps, but she is having trouble filling the syring with insulin????  The PDM keeps saying no pod is active.  She was told to call the 800 help line and to let me know in 15 min.,  if she is able to activate the pod, and put it on.  She was told that she may need to go back on injections, because she is having difficulty with all of this.  I spoke with her on Thursday, she has called the office on Friday, and yesterday.  Pt. Was not able to tell me if she was able to draw up 2 ccs of insulin into the syringe.  She says she is having difficulty doing this.    Am not sure what is going on.  If she calls back in 15 min., with no pod, I recommend she go back on injections.

## 2016-01-20 ENCOUNTER — Telehealth: Payer: Self-pay | Admitting: Gastroenterology

## 2016-01-20 ENCOUNTER — Other Ambulatory Visit: Payer: Self-pay

## 2016-01-20 MED ORDER — INSULIN GLARGINE 100 UNIT/ML SOLOSTAR PEN: 32.0000 [IU] | PEN_INJECTOR | Freq: Every day | SUBCUTANEOUS | 1 refills | Status: DC

## 2016-01-20 MED ORDER — INSULIN ASPART 100 UNIT/ML FLEXPEN: PEN_INJECTOR | SUBCUTANEOUS | 0 refills | Status: DC

## 2016-01-20 MED ORDER — OMEPRAZOLE 40 MG PO CPDR: 40.0000 mg | DELAYED_RELEASE_CAPSULE | Freq: Two times a day (BID) | ORAL | 3 refills | Status: DC

## 2016-01-20 NOTE — Telephone Encounter (Signed)
Called and spoke with Pharmacy Tech regarding medications, both the Lantus and Novolog have already shipped out, on 01/18/16. Patient should receive them in a couple of days.  

## 2016-01-20 NOTE — Telephone Encounter (Signed)
Pt is now also asking if we can call in the novolog and the lantus to [facility] on [address] 30 day supply

## 2016-01-20 NOTE — Telephone Encounter (Signed)
RX sent

## 2016-01-20 NOTE — Telephone Encounter (Signed)
Patient ask if you could send two or three of the  insulin aspart (NOVOLOG FLEXPEN) 100 UNIT/ML FlexPen  Insulin Glargine (LANTUS SOLOSTAR) 100 UNIT/ML Solostar Pen  Until [facility]  She would like to know how much a box would be??

## 2016-01-20 NOTE — Telephone Encounter (Signed)
Called patient to inform her omeprazole was sent to mail order, She told me to cancel the script her PCP already sent it in for her. Called [facility] and cancelled order per pts request

## 2016-01-20 NOTE — Telephone Encounter (Signed)
Refill of  insulin aspart (NOVOLOG FLEXPEN) 100 UNIT/ML  Insulin Glargine (LANTUS SOLOSTAR) 100 UNIT/ML Solostar Pen  [facility] [id number] - [place], [place] - [address] [facility] [address] [facility] [address] [phone] (Phone) [phone] (Fax)     And [facility]

## 2016-01-20 NOTE — Telephone Encounter (Signed)
Pt needs us to call in the BD needles for the novolog and lantus pens please [facility] please urgent

## 2016-01-20 NOTE — Telephone Encounter (Signed)
Patient states that her pcp prescribes this and to disregard this message.

## 2016-01-20 NOTE — Telephone Encounter (Signed)
Called and spoke with Pharmacy Tech regarding medications, both the Lantus and Novolog have already shipped out, on 01/18/16. Patient should receive them in a couple of days.

## 2016-01-20 NOTE — Telephone Encounter (Signed)
novolog pens and the lantus solostar needs to be [facility] as an urgent refill   She is no longer on the pods, linda gave her a new scale

## 2016-01-21 NOTE — Telephone Encounter (Signed)
[facility] note dated 01/20/16 5:15  Caller states she got a refill for one insulin pen was told [facility] could give her two to last until her refill is in. But they wont  Caller states she [facility] until her full rx is mailed to her. Caller states office called rx in earlier but [facility] would not fill partial rx because she has a full rx on the way.  RN on call informed pt to go to ER if blood sugars are too high.  Please see previous notes from [name]

## 2016-01-23 ENCOUNTER — Telehealth: Payer: Self-pay | Admitting: Internal Medicine

## 2016-01-23 NOTE — Progress Notes (Signed)
Pt. Reported that she woke up with pod off, and she has not been able put a new one on, even with me going over it by phone previously that morning, 1 hour earlier.  She said she tried calling the help line also, but was not able to get the pod on.   She reported that she has not had a pod on for 7-8 hour.  Pt. Was crying and very upset that she could not do this.   Blood sugar was 267.  She reported that her husband was no help to her and that he would not help her with the pods, and told her that "she was on her own".      She brought her PDM and a new pod, but Dr. Elvera LennoxGherghe and myself decided that she needs to go back on injections for a while.  She was told this. With great effort, she was again shown how to take the Lantus and Novolog via pen.  She was given samples of pen needles, and written instructions were given for Lantus:  32u q HS, and Novolog 4u for smaller meals, 5u for medium meal size,and 6u for larger meals, plus a correction dose.  We reviewed this several times, and after 4 tries, she was able to come up with a correct Novolog dose when I gave her a ficktious blood sugar reading.   She was told to call in 1 weeks and we will review the blood sugar readings.

## 2016-01-23 NOTE — Telephone Encounter (Signed)
I am not sure. Please ask her to contact PCP for these issues.

## 2016-01-23 NOTE — Telephone Encounter (Signed)
Pt called in this morning and was wondering if it is okay to take Emetrol for nausea.  She also wants to know what Dr. Elvera LennoxGherghe thinks about being 'sick to her stomach' for 3 weeks. (feeling nauseated, but no vomiting)

## 2016-01-23 NOTE — Telephone Encounter (Signed)
Patient advised to contact PCP from previous conversation with front desk staff.

## 2016-01-24 NOTE — Telephone Encounter (Signed)
Pt has an appt on Thursday she wants us to know ahead of time that she has fainted 6 times in the last 2 wks.

## 2016-01-24 NOTE — Telephone Encounter (Signed)
This is the first time informing us about fainting. She needs to contact PCP about this. Please let us know if she had low CBGs during the episode.

## 2016-01-24 NOTE — Patient Instructions (Signed)
Take insulin dose as written on take home sheet. Call if blood sugars drop low, or remain over 250.

## 2016-01-24 NOTE — Telephone Encounter (Signed)
01/17/16 4PM: Husband called saying he put a pod on his wife this AM at 8:00, and it was working fine.

## 2016-01-24 NOTE — Telephone Encounter (Signed)
Called patient and spoke about the fainting spells, patient states she will check sugars when it happens again. Patient Was notified again of sliding scale and how to take insulins, patient comes in on Thursday and we will discuss.

## 2016-01-24 NOTE — Telephone Encounter (Signed)
Please advise. I wanted you to be notified before patient appointment on Thursday. This is the first time hearing of the fainting. Thank you!

## 2016-01-25 ENCOUNTER — Telehealth (HOSPITAL_COMMUNITY): Payer: Self-pay

## 2016-01-25 NOTE — Telephone Encounter (Signed)
Medication problem - Called patient back after she left a message concerned she is taking + Buspar 5 mg 6 pills twice a day.  Informed patient per Dr. Sheela StackArfeen's note this should be 5 mg, one twice a day from 01/16/16 visit.  Patient reported she has been taking 6 twice a day for several days but after some discussion agreed to cut this back and to change her bottle to reflect the appropriate dosage of 5 mg twice a day.  Patient then reported she would run out of pills prior to full 3 month supply due to taking more than needed over the past 4 days and requested patient call back when getting closer to the end of her supply for an earlier order and agreed to send Dr. Lolly MustacheArfeen a message about the order problem and her concerns.  Patient stated understanding Dr. Lolly MustacheArfeen just wants her taking Buspar 5 mg one twice a day and not 6 pills twice a day.  Patient to call back if any further concerns or problems with medication.

## 2016-01-26 ENCOUNTER — Encounter: Payer: Self-pay | Admitting: Internal Medicine

## 2016-01-26 ENCOUNTER — Ambulatory Visit (INDEPENDENT_AMBULATORY_CARE_PROVIDER_SITE_OTHER): Admitting: Internal Medicine

## 2016-01-26 VITALS — BP 132/84 | HR 101 | Wt 168.0 lb

## 2016-01-26 DIAGNOSIS — E139 Other specified diabetes mellitus without complications: Secondary | ICD-10-CM

## 2016-01-26 DIAGNOSIS — E109 Type 1 diabetes mellitus without complications: Secondary | ICD-10-CM

## 2016-01-26 MED ORDER — INSULIN ASPART 100 UNIT/ML FLEXPEN: PEN_INJECTOR | SUBCUTANEOUS | 3 refills | Status: DC

## 2016-01-26 MED ORDER — INSULIN GLARGINE 100 UNIT/ML SOLOSTAR PEN: 32.0000 [IU] | PEN_INJECTOR | Freq: Every day | SUBCUTANEOUS | 3 refills | Status: DC

## 2016-01-26 NOTE — Patient Instructions (Signed)
Please continue: - Lantus 32 units at night  Increase: - NovoLog mealtime: 7 units before a smaller meal 8 units before a regular meal 9 units before a larger meal  Continue: -  Novolog SSI - 150-175: + 1 unit  - 176-200: + 2 units  - 201-225: + 3 units  - 226-250: + 4 units  - >250: + 5 units  Please return in 1.5 months with your sugar log.

## 2016-01-26 NOTE — Progress Notes (Signed)
Patient ID: Sabrina ComptonBarbara L Woolford, female   DOB: 12/30/1955, 61 y.o.   MRN: [id number]  HPI: Sabrina ComptonBarbara L Detter is a 61 y.o.-year-old female, initially referred by her PCP, Dr. Assunta FoundJohn Golding (PA: Lenise HeraldBenjamin Mann), returning for f/u for LADA, dx 2005, insulin-dependent since ~2006, controlled, with complications (PN, gastroparesis?). Last visit 1 mo ago.  She started on an Omnipod insulin pump on 08/01/2015. She had severe anxiety and depression and got so overwhelmed with the insulin pump that we had to take her off the pump earlier this month.   Despite this, she continued to call our office multiple times a day for different reasons, some related to high blood sugars, some completely different.   She is telling me that she had a "mental breakdown". She started Buspar 1 week ago >> feels better.  Previous fructosamine with corresponding HbA1c levels: 12/15/2015: HbA1c calculated from the fructosamine is higher, 8.4%. 08/24/2015:  HbA1c calculated from fructosamine is 7.4%. 05/24/2015: HbA1c calculated from fructosamine is 6.9%. Office Visit on 11/23/2014  Component Date Value Ref Range HbA1c - calculated   F Fructosamine 02/23/2015  334* 190 - 270 umol/L 7.28%         . Fructosamine 11/23/2014 332* 190 - 270 umol/L 7.25%   . Fructosamine 09/01/2014 328* 190 - 270 umol/L 7.18%   . Fructosamine 04/20/2014 388* 190 - 270 umol/L 8.2%.   . Fructosamine- 10/29/2013 418* 190 - 270 umol/L 8.7%    Lab Results  Component Value Date   HGBA1C 4.6 07/16/2013   HGBA1C sent to [facility] 07/16/2013   HGBA1C 4.8 04/15/2012  - 02/12/2013: 4.2% - 12/19/2012: 5.2% - 09/18/2012: 6.5%  Previous insulin pump settings: Pump settings: - basal rates: 12 am - 11:30 am: 1.2 11:30 am - 4 pm: 1.1 4 pm - 12 am: 1.15 - ICR:  12 am - 4 pm: 5 - target: 120-120 - ISF:  12 am - 7 am: 50 - Insulin on Board: 4h - extended bolusing: not using - changes infusion site: q3 days - Meter: Omnipod TDD basal: 26.6 units TDD  bolus 19.5 units  Also on: - Metformin 500 mg 2x a day   She is now on: - started 01/17/2016: - Lantus 32 units at night - NovoLog mealtime: 6 units before a smaller meal 7 units before a regular meal 8 units before a larger meal -  Novolog SSI - 150-175: + 1 unit  - 176-200: + 2 units  - 201-225: + 3 units  - 226-250: + 4 units  - >250: + 5 units  Pt checks her sugars 4x a day (no log): - am:  87-227 >> 115, 154-246 >> 116-202, 229 >> 107-195, 202 >> 97-324 >> 150-190 - 2h after b'fast: 59, 108-182, 237 >> 80-190 >> 92-236 >> 128-146 >> n/c - before lunch:49x 1 (delayed lunch), 105-153, 214 >> 89-166 >> 48, 71-154, 194 >> 90-373 >> 203 - 2h after lunch: 68-155 >> 48, 297 >> 38, 92-150 >> 151-196 >> n/c >>84-162 >> 67-HI >> n/c - before dinner:  60, 116-302 >> 91-210 >> 174-223, 295 >> 62-252, 297 >> 121-311 >> cannot remember - 2h after dinner: 38, 50-194 >> 58x1, 67x1, 108-219 >> 107-277 >> 92-212 >> 211-365 >> 162 - bedtime: 83-203 >> 58, 81-200, 210, 251 >> 68, 99-231 >> 119-162 >> see above >> 140-180 - at night: 50, 57-244, 302 >> 141-185 >> 187 >> 111-169, 280 >> 179-320 >> 116 Has lows. Lowest sugar was 51 >> 34 and 38 >>  49 >> 89 >> 48 >> 51 >> 33 (when she was sick at not eat); she has hypoglycemia awareness at 50. Highest sugar was 347 >> 302 >> 200s >> 295 >> HI x1 >> 300s, not as many after stopping the pump.  Pt's meals are: - Breakfast: toast, bowl of cereal + 2% milk  - Lunch: soup, sometimes sandwich, vegetables - Dinner: chicken/pork/beef + vegetable, no starch - Snacks: sugar free sweets  - no CKD, last BUN/creatinine:  Lab Results  Component Value Date   BUN 15 08/24/2015   CREATININE 0.59 08/24/2015   - Lipids: Lab Results  Component Value Date   CHOL 206 (H) 08/24/2015   HDL 51.60 08/24/2015   LDLCALC 133 (H) 08/24/2015   TRIG 109.0 08/24/2015   CHOLHDL 4 08/24/2015  161/92/46/97 in 03/18/2013.  Not on a statin.   - last eye exam was in  [place] in 12/2014. No DR. + small cataract. Office Visit on 02/23/2015  Component Date Value Ref Range Status  . HM Diabetic Eye Exam 12/15/2014 No Retinopathy  No Retinopathy Final   - + numbness and tingling in her feet. On Neurontin.  She is seeing a podiatrist [facility]).  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Also: GERD, HTN, HL, Fibromyalgia >> on Gabapentin.  Last TSH: Lab Results  Component Value Date   TSH 0.81 08/24/2015   I reviewed pt's medications, allergies, PMH, social hx, family hx, and changes were documented in the history of present illness. Otherwise, unchanged from my initial visit note.   ROS: Constitutional: no weight gain, + fatigue, + hot flushes, +poor sleep Eyes: no blurry vision, no xerophthalmia ENT: no sore throat, no nodules palpated in throat, no dysphagia/no odynophagia, no hoarseness Cardiovascular: no CP/SOB/palpitations/no leg swelling Respiratory: no cough/SOB Gastrointestinal: no N/V/+ D/+ C/no heartburn Musculoskeletal: no muscle/no joint aches Skin: no rashes,no hair loss Neurological: no tremors/no numbness/tingling/dizziness, + HA  PE: BP 132/84 (BP Location: Left Arm, Patient Position: Sitting)   Pulse (!) 101   Wt 168 lb (76.2 kg)   SpO2 97%   BMI 28.84 kg/m    Wt Readings from Last 3 Encounters:  01/26/16 168 lb (76.2 kg)  12/15/15 171 lb (77.6 kg)  10/17/15 165 lb (74.8 kg)   Constitutional: overweight, in NAD Eyes: PERRLA, EOMI, no exophthalmos ENT: moist mucous membranes, no thyromegaly, no cervical lymphadenopathy Cardiovascular: Tachycardia, RR, No MRG Respiratory: CTA B Gastrointestinal: abdomen soft, NT, ND, BS+ Musculoskeletal: no deformities, strength intact in all 4 Skin: moist, warm, no rashes Neurological: no tremor with outstretched hands, DTR normal in all 4  ASSESSMENT: 1. LADA, insulin-dependent, uncontrolled, with  complications - peripheral neuropathy - gastroparesis?  Component     Latest Ref Rng 07/16/2013  C-Peptide     0.80 - 3.90 ng/mL 1.29  Glucose     70 - 99 mg/dL 161 (H)  Glutamic Acid Decarb Ab     <=1.0 U/mL 11.3 (H)  Pancreatic Islet Cell Antibody     <5 JDF Units 5 (A)  + anti-pancreatic antibodies >> LADA rather than type 2 DM. She still has a positive C peptide >> still has insulin secretion. For this reason, we continued the Metformin.  PLAN:  1. Patient with LADA, on basal-bolus + oral antidiabetic regimen, with initially better sugars after starting the insulin pump, but then worsened parallel with her increased anxiety/depression. Her condition got to the point  that we needed to stop the pump and start basal-bolus insulin regimen ~10 days ago. We gave her a fixed regimen of NovoLog to avoid further anxiety from needing to calculate the dose based on insulin to carb ratios. Also, she received a written sliding scale, so she does not need to calculate this from CBG target and sensitivity factor. - At this visit, she appears to be doing better, after she started her BuSpar. She does not bring a log, however, per her recall, her sugars are better than before. Will only increase her mealtime insulin doses for now pending a complete CBG log. - At this visit, we also discussed about the fact that I cannot help her with non-diabetic complaints, and she needs to contact PCP for these. She understands this and agrees to do this in the future. -  I suggested to:  Patient Instructions  Please continue: - Lantus 32 units at night  Increase: - NovoLog mealtime: 7 units before a smaller meal 8 units before a regular meal 9 units before a larger meal  Continue: -  Novolog SSI - 150-175: + 1 unit  - 176-200: + 2 units  - 201-225: + 3 units  - 226-250: + 4 units  - >250: + 5 units  Please return in 1.5 months with your sugar log.   - continue checking sugars at different times of the  day - check 4 times a day, rotating checks - She needs a new eye exam, I advised her to schedule - given flu shot this season - Please return in 1.5 months    Carlus Pavlov, MD PhD [facility] ([place]) Endocrinology

## 2016-01-29 ENCOUNTER — Other Ambulatory Visit (HOSPITAL_COMMUNITY): Payer: Self-pay | Admitting: Psychiatry

## 2016-01-29 DIAGNOSIS — F3131 Bipolar disorder, current episode depressed, mild: Secondary | ICD-10-CM

## 2016-01-29 DIAGNOSIS — F411 Generalized anxiety disorder: Secondary | ICD-10-CM

## 2016-01-30 ENCOUNTER — Other Ambulatory Visit (HOSPITAL_COMMUNITY): Payer: Self-pay | Admitting: Psychiatry

## 2016-01-30 ENCOUNTER — Telehealth (HOSPITAL_COMMUNITY): Payer: Self-pay

## 2016-01-30 ENCOUNTER — Telehealth: Payer: Self-pay | Admitting: Gastroenterology

## 2016-01-30 ENCOUNTER — Other Ambulatory Visit: Payer: Self-pay

## 2016-01-30 ENCOUNTER — Telehealth: Payer: Self-pay | Admitting: Internal Medicine

## 2016-01-30 MED ORDER — OMEPRAZOLE 40 MG PO CPDR: 40.0000 mg | DELAYED_RELEASE_CAPSULE | Freq: Two times a day (BID) | ORAL | 3 refills | Status: DC

## 2016-01-30 MED ORDER — BD PEN NEEDLE NANO U/F 32G X 4 MM MISC: 5 refills | Status: DC

## 2016-01-30 NOTE — Telephone Encounter (Signed)
She told me that she stopped Vistaril.  She is taking Valium and BuSpar for anxiety.  If she agreed we can increase BuSpar 5 mg 3 times a day.

## 2016-01-30 NOTE — Telephone Encounter (Signed)
Called patient to inform med was sent to [facility]

## 2016-01-30 NOTE — Telephone Encounter (Signed)
BD nano needles need to be called into [facility] please

## 2016-01-30 NOTE — Telephone Encounter (Signed)
rx submitted.  

## 2016-01-30 NOTE — Telephone Encounter (Signed)
Patient is calling for a refill on her Vistaril - per your last note it was discontinued, patient states that is not right, that she uses is at least 5 times a week and she would like it refilled. Please review and advise, thank you

## 2016-01-31 ENCOUNTER — Telehealth: Payer: Self-pay | Admitting: Internal Medicine

## 2016-01-31 ENCOUNTER — Other Ambulatory Visit (HOSPITAL_COMMUNITY): Payer: Self-pay

## 2016-01-31 ENCOUNTER — Telehealth: Payer: Self-pay

## 2016-01-31 ENCOUNTER — Other Ambulatory Visit: Payer: Self-pay

## 2016-01-31 DIAGNOSIS — F411 Generalized anxiety disorder: Secondary | ICD-10-CM

## 2016-01-31 MED ORDER — BUSPIRONE HCL 5 MG PO TABS: 5.0000 mg | ORAL_TABLET | Freq: Two times a day (BID) | ORAL | 0 refills | Status: DC

## 2016-01-31 NOTE — Telephone Encounter (Signed)
Pt called in and said that she still needs her pen needles, Novolog, and [facility] Solostar [facility], because she is using old insulin.

## 2016-01-31 NOTE — Telephone Encounter (Signed)
Patient called me back this morning and I told her what Dr. Lolly MustacheArfeen said. She said that for now she will stay at 2 Buspar a day and she will call me if she feels she needs to increase.

## 2016-01-31 NOTE — Telephone Encounter (Signed)
Called and spoke with patient regarding medications, after speaking with [facility], insulin should be delivered to her tomorrow, the Lantus should still be good as she received it on the 20th. The pen needles will be sent out today, and will be delivered by the end of the week.

## 2016-01-31 NOTE — Telephone Encounter (Signed)
Contacted patient.

## 2016-01-31 NOTE — Telephone Encounter (Signed)
Attempting to contact [facility]

## 2016-02-09 ENCOUNTER — Telehealth: Payer: Self-pay

## 2016-02-09 ENCOUNTER — Telehealth: Payer: Self-pay | Admitting: Internal Medicine

## 2016-02-09 NOTE — Telephone Encounter (Signed)
Called and left message to call back to discuss increase in insulin.

## 2016-02-09 NOTE — Telephone Encounter (Signed)
Pt called in and said that she is doing everything she was told to do and that her sugars are still 200's in the morning and they tend to jump a little higher during the day.  She wants to know if she keeps doing everything correctly, will she be okay until her next appointment?

## 2016-02-09 NOTE — Telephone Encounter (Signed)
Called patient and advised to call back to discuss increase insulins. Gave call back number.

## 2016-02-09 NOTE — Telephone Encounter (Signed)
Called patient, and discussed, no questions at this time.

## 2016-02-09 NOTE — Telephone Encounter (Signed)
Please change: Please continue: - Lantus 32 >> 36 units at night - NovoLog mealtime: 7 >> 9 units before a smaller meal 8 >> 10 units before a regular meal 9 >> 11 units before a larger meal

## 2016-02-13 ENCOUNTER — Telehealth: Payer: Self-pay | Admitting: Internal Medicine

## 2016-02-13 ENCOUNTER — Telehealth: Payer: Self-pay

## 2016-02-13 NOTE — Telephone Encounter (Signed)
Patient called to report her blood sugar reading. Patient stated 1 hr ago she had a blood sugar reading for 27 and now it it up to 68. Patient stated she is taking 9 units with small meals, 10 units with medium meals and 11 units large. Patient wanted to know if she could reduce each of her meal dosage by 1 unit to help with her low blood sugars?  Please advise during Dr. Elvera LennoxGherghe absence? Thanks!

## 2016-02-13 NOTE — Telephone Encounter (Signed)
I contacted patient and advised of message. [name] stated the instructions listed were confusing and [name] wanted to see what Dr. Elvera LennoxGherghe said in the am.

## 2016-02-13 NOTE — Telephone Encounter (Signed)
Patient stated her blood sugar was 27 at this very moment. I told her to go the ER, she told me she was going to eat a candy bar. She stated she wanted to go back to the old schedule Gherghe had her on.  Please advise

## 2016-02-13 NOTE — Telephone Encounter (Signed)
If this is the only incident of low blood sugar then she should reduce the NOVOLOG dose at lunchtime by 3 units for what ever she is normally taking.  She may need to be seen by Bonita QuinLinda for help with insulin adjustment

## 2016-02-14 ENCOUNTER — Telehealth: Payer: Self-pay

## 2016-02-14 NOTE — Telephone Encounter (Signed)
Called patient and understood the message from Dr.Gherghe. No questions at this time.  

## 2016-02-14 NOTE — Telephone Encounter (Signed)
Called patient and understood the message from Dr.Gherghe. No questions at this time.

## 2016-02-14 NOTE — Telephone Encounter (Signed)
He has, absolutely, reduce the doses by 1 unit and call last back in 1-2 days to tell us how she's doing.

## 2016-02-15 ENCOUNTER — Telehealth: Payer: Self-pay | Admitting: Gastroenterology

## 2016-02-15 ENCOUNTER — Telehealth: Payer: Self-pay

## 2016-02-15 ENCOUNTER — Telehealth (HOSPITAL_COMMUNITY): Payer: Self-pay

## 2016-02-15 NOTE — Telephone Encounter (Signed)
Called patient, she states these blood sugars are before meals. The lowest one was 27, and she new what to do to raise it up and got it to 110. Patient states she comes in on the 8th, but wanted us aware of her lows that she does have sometimes.

## 2016-02-15 NOTE — Telephone Encounter (Signed)
Called patient, she states these blood sugars are before meals. The lowest one was 27, and she new what to do to raise it up and got it to 110. Patient states she comes in on the 8th, but wanted us aware of her lows that she does have sometimes.  

## 2016-02-15 NOTE — Telephone Encounter (Signed)
Called and notified of Dr.Gherghe's note. Patient understood and had no questions.

## 2016-02-15 NOTE — Telephone Encounter (Signed)
Pt is very nauseous and still not feeling well at all.   BS readings:  2/12 93; 119; 27; 110; 201 2/13 147; 156; 126; 55 2/14 150

## 2016-02-15 NOTE — Telephone Encounter (Signed)
She is already taking Valium and BuSpar.

## 2016-02-15 NOTE — Telephone Encounter (Signed)
Raynelle FanningJulie, can you find out more details about the lows? Also, when were those sugars checked: before or after meals.

## 2016-02-15 NOTE — Telephone Encounter (Signed)
Called and notified of Dr.Gherghe's note. Patient understood and had no questions.  

## 2016-02-15 NOTE — Telephone Encounter (Signed)
Patient is calling to talk about her nausea, she said she had an endoscopy done and they can find nothing wrong. She states her doctor told her that it is coming from her nerves. Patient said she would like to know if there is anything you can do. Please review and advise, thank you

## 2016-02-15 NOTE — Telephone Encounter (Signed)
Let's try to decrease Lantus back to 32 units.

## 2016-02-16 NOTE — Telephone Encounter (Signed)
Called patient and got her voicemail, I left a message for her to call me back.

## 2016-02-16 NOTE — Telephone Encounter (Signed)
Left message for the patient. She has also been in touch with her mental health and diabetes management team. Message to encourage her to follow their advise and to contact us if she is advised to see us.

## 2016-02-17 ENCOUNTER — Telehealth: Payer: Self-pay | Admitting: Gastroenterology

## 2016-02-20 ENCOUNTER — Other Ambulatory Visit: Payer: Self-pay

## 2016-02-20 MED ORDER — PANTOPRAZOLE SODIUM 20 MG PO TBEC: 20.0000 mg | DELAYED_RELEASE_TABLET | Freq: Two times a day (BID) | ORAL | 3 refills | Status: DC

## 2016-02-20 NOTE — Telephone Encounter (Signed)
Ok to try different PPI

## 2016-02-20 NOTE — Telephone Encounter (Signed)
Patient takes Omeprazole BID. She noticed a return of her indigestion symptoms about a month ago. She states her symptoms are unrelated to eating and come at various times of the day. She has supplemented with TUMS and gets temporary relief. The patient asks if she may possibly need to try a different PPI?

## 2016-02-21 ENCOUNTER — Telehealth: Payer: Self-pay | Admitting: Gastroenterology

## 2016-02-21 NOTE — Telephone Encounter (Signed)
Confirmed with the patient. She will take her present PPI, Omeprazole, until the Pantoprazole arrives through the mail. She will then stop taking Omeprazole and begin taking Pantoprazole instead.

## 2016-02-28 ENCOUNTER — Telehealth (HOSPITAL_COMMUNITY): Payer: Self-pay

## 2016-02-28 ENCOUNTER — Telehealth: Payer: Self-pay | Admitting: Gastroenterology

## 2016-02-28 NOTE — Telephone Encounter (Signed)
Patient is calling because for the last several months she has had severe nausea and dry heaving. Patient went to the Gastroenterologist and they did a scope and an ultrasound. The GI doctor told the patient that her upset stomach is caused by her nerves and that she needs to speak to you. I have talked to the patient several times over the past couple of days, but she is insistent on hearing from you. I advised that you can get an upset stomach from anxiety or stress and I recommended that she seek some therapy. Patient is willing to try this, but still would like to speak to you.

## 2016-02-28 NOTE — Telephone Encounter (Signed)
Left information for her. DPR on file. Not the usual adverse effect of Linzess. Many different causes of nausea.

## 2016-02-29 ENCOUNTER — Telehealth: Payer: Self-pay

## 2016-02-29 ENCOUNTER — Telehealth: Payer: Self-pay | Admitting: Internal Medicine

## 2016-02-29 NOTE — Telephone Encounter (Signed)
Called patient to let her know of Dr.Gherghe note. Patient understood, sent out empty logs to be mailed to patient today, will await for her lab sheets to come in through the mail. Patient understood to call back in 4 days with her sugars.

## 2016-02-29 NOTE — Telephone Encounter (Signed)
Called patient to let her know of Dr.Gherghe note. Patient understood, sent out empty logs to be mailed to patient today, will await for her lab sheets to come in through the mail. Patient understood to call back in 4 days with her sugars.  

## 2016-02-29 NOTE — Telephone Encounter (Signed)
Called patient and asked how sugars have been running.  Patient states her morning sugars are what are running high since we changed the lantus.. Morning sugars: 179 170 182 143   Her evening sugars have been: 145 186 62 (last night)  Patient states she is doing novolog 8 (small meal) 9 (medium meals) 10 (large meals) and sliding scale. Patient is going lantus 32 units.  She is also sending us her logs in the mail as she can not get out right now due to being sick.   

## 2016-02-29 NOTE — Telephone Encounter (Signed)
She may need more NovoLog with dinner. What doses he is using now? How high her to sugars at bedtime, can she give us more information about the sugars (can she send us the sugar log)?

## 2016-02-29 NOTE — Telephone Encounter (Signed)
Pt called in and said that ever since Dr. Elvera LennoxGherghe changed her insulin dosage, her sugar levels have been higher at night.  Pt also was scheduled to see Dr. Elvera LennoxGherghe at the beginning of the month but had to reschedule due to being sick and wanted us to be aware.

## 2016-02-29 NOTE — Telephone Encounter (Signed)
Called patient and asked how sugars have been running.  Patient states her morning sugars are what are running high since we changed the lantus.. Morning sugars: 179 170 182 143   Her evening sugars have been: 145 186 62 (last night)  Patient states she is doing novolog 8 (small meal) 9 (medium meals) 10 (large meals) and sliding scale. Patient is going lantus 32 units.  She is also sending us her logs in the mail as she can not get out right now due to being sick.

## 2016-02-29 NOTE — Telephone Encounter (Signed)
We can increase Lantus to 34 units in the next 4 days and maybe needs a higher dose (36 units) afterwards.  Let us know how the sugars run in few days.

## 2016-03-02 NOTE — Telephone Encounter (Signed)
I returned patient's phone call I left a message. 

## 2016-03-08 ENCOUNTER — Ambulatory Visit: Payer: Self-pay | Admitting: Internal Medicine

## 2016-03-09 ENCOUNTER — Telehealth: Payer: Self-pay | Admitting: Gastroenterology

## 2016-03-09 ENCOUNTER — Other Ambulatory Visit: Payer: Self-pay

## 2016-03-09 MED ORDER — OMEPRAZOLE 40 MG PO CPDR: 40.0000 mg | DELAYED_RELEASE_CAPSULE | Freq: Two times a day (BID) | ORAL | 3 refills | Status: DC

## 2016-03-09 NOTE — Telephone Encounter (Signed)
Patient will change back to Omeprazole. She still has some. She will let us know when she needs a refill.

## 2016-03-15 ENCOUNTER — Telehealth: Payer: Self-pay | Admitting: *Deleted

## 2016-03-15 MED ORDER — OMEPRAZOLE 40 MG PO CPDR: 40.0000 mg | DELAYED_RELEASE_CAPSULE | Freq: Two times a day (BID) | ORAL | 3 refills | Status: DC

## 2016-03-15 NOTE — Telephone Encounter (Signed)
Omeprazole refill request from pharmacy

## 2016-03-20 ENCOUNTER — Telehealth: Payer: Self-pay | Admitting: Internal Medicine

## 2016-03-20 NOTE — Telephone Encounter (Signed)
Please advise 

## 2016-03-20 NOTE — Telephone Encounter (Signed)
Patient ask you to call her concerning her insurance not being in network.

## 2016-03-20 NOTE — Telephone Encounter (Signed)
Called pt we are in network with [employer]. Between the time of the previous message and this message she has contacted [facility] and all of this was resolved.

## 2016-03-21 ENCOUNTER — Telehealth: Payer: Self-pay | Admitting: Internal Medicine

## 2016-03-26 ENCOUNTER — Telehealth: Payer: Self-pay

## 2016-03-26 ENCOUNTER — Telehealth: Payer: Self-pay | Admitting: Gastroenterology

## 2016-03-26 NOTE — Telephone Encounter (Signed)
Called patient and advised of Dr.Gherghe's notes on patients log that will be placed to scan. Patient understood no changes right now and had no questions.

## 2016-03-27 NOTE — Telephone Encounter (Signed)
Spoke with pt and explained that some pts have a BM every day and some every other day, every pt is different and the drugs affect people differently. Pt aware and states she will keep her OV as scheduled.

## 2016-03-28 ENCOUNTER — Ambulatory Visit (INDEPENDENT_AMBULATORY_CARE_PROVIDER_SITE_OTHER): Admitting: Neurology

## 2016-03-28 ENCOUNTER — Encounter: Payer: Self-pay | Admitting: Neurology

## 2016-03-28 VITALS — BP 186/95 | HR 88 | Ht 64.0 in | Wt 170.6 lb

## 2016-03-28 DIAGNOSIS — R413 Other amnesia: Secondary | ICD-10-CM

## 2016-03-28 NOTE — Patient Instructions (Signed)
Remember to drink plenty of fluid, eat healthy meals and do not skip any meals. Try to eat protein with a every meal and eat a healthy snack such as fruit or nuts in between meals. Try to keep a regular sleep-wake schedule and try to exercise daily, particularly in the form of walking, 20-30 minutes a day, if you can.   As far as diagnostic testing: Labs, MRI brain  I would like to see you back in 4 months, sooner if we need to. Please call us with any interim questions, concerns, problems, updates or refill requests.   Our phone number is [phone]. We also have an after hours call service for urgent matters and there is a physician on-call for urgent questions. For any emergencies you know to call 911 or go to the nearest emergency room

## 2016-03-28 NOTE — Progress Notes (Signed)
[facility]    Provider:  Dr Lucia Gaskins Referring Provider: Assunta Found, MD Primary Care Physician:  Colette Ribas, MD  CC:  "Ask my husband"  HPI:  [name] is a 61 y.o. female here as a referral from Dr. Phillips Odor for memory loss. Past medical history irritable bowel syndrome, hyperlipidemia, hypertension, diabetes, depression, bipolar, anxiety. She denies any issues. She is here alone. She sometimes loses things but finds them. She may forget some things but then she remembers. She has some memory issues but she feels this is age related and also lots of medications and bipolar disorder. She may repeat things in the same day, she doesn't know. She says her husband repeats herself. Her husband says he told her that already. Husband has been complaining always about memory, for years and he complains about everything. She denies getting lost in the car but would have problems going to new places. Husband pays the bills now and she says she had manic-depression after smoking pot and husband had to take it over 10 years ago. No alcohol use past or drugs other thn marijuana. She writes down a lot of things and that's how she remembers appointments. She denies problems cooking and no accidents in the home. The last time she fell was 6 months ago and in the setting of lithium use and possibly some toxicity.  Her husband threaend to sue the doctor if they did not take her off of the Lithium.   Reviewed notes, labs and imaging from outside physicians, which showed:   CT head 2013 showed No acute intracranial abnormalities including mass lesion or mass effect, hydrocephalus, extra-axial fluid collection, midline shift, hemorrhage, or acute infarction, large ischemic events (personally reviewed images)     Review of Systems: Patient complains of symptoms per HPI as well as the following symptoms: No chest pain or shortness of breath. Pertinent negatives per HPI. All others  negative.   Social History   Social History  . Marital status: Married    Spouse name: N/A  . Number of children: 1  . Years of education: 63   Occupational History  . Disabled    Social History Main Topics  . Smoking status: Never Smoker  . Smokeless tobacco: Never Used  . Alcohol use No  . Drug use: No  . Sexual activity: Not Currently   Other Topics Concern  . Not on file   Social History Narrative   Lives at home w/ her husband   Left-handed   Caffeine: 1-2 cups of coffee daily    Family History  Problem Relation Age of Onset  . Hypertension Mother   . Bipolar disorder Mother   . Cancer Mother   . Heart disease Brother   . Thyroid disease Sister   . Hypertension Sister   . Bipolar disorder Sister   . Depression Father   . Cancer Father   . Bipolar disorder Daughter   . Bipolar disorder Maternal Aunt   . Colon cancer Neg Hx   . Colon polyps Neg Hx   . Esophageal cancer Neg Hx   . Kidney disease Neg Hx   . Gallbladder disease Neg Hx   . Diabetes Neg Hx     Past Medical History:  Diagnosis Date  . Anxiety   . Bipolar 1 disorder (HCC)   . Breast density 06/25/2012   Right breast density, will get mammogram and [place]  . Depression   . Diabetes mellitus without complication (HCC)   .  Duodenitis   . HTN (hypertension)   . Hyperlipidemia   . IBS (irritable bowel syndrome)     Past Surgical History:  Procedure Laterality Date  . ABDOMINAL HYSTERECTOMY    . CHOLECYSTECTOMY    . COLONOSCOPY      Current Outpatient Prescriptions  Medication Sig Dispense Refill  . AMBULATORY NON FORMULARY MEDICATION Squatty Potty x 1 1 each 0  . amLODipine (NORVASC) 5 MG tablet Take 5 mg by mouth daily.    . BD PEN NEEDLE NANO U/F 32G X 4 MM MISC Use with insulins. 100 each 5  . BENICAR 40 MG tablet     . busPIRone (BUSPAR) 5 MG tablet Take 1 tablet (5 mg total) by mouth 2 (two) times daily. 180 tablet 0  . diazepam (VALIUM) 5 MG tablet Take 1 tablet (5 mg total) by  mouth 3 (three) times daily. 270 tablet 0  . FLUoxetine (PROZAC) 40 MG capsule Take 1 capsule (40 mg total) by mouth daily. 90 capsule 0  . gabapentin (NEURONTIN) 800 MG tablet Take 800 mg by mouth. 1 tablet in the morning and 2 tablets at bedtime.    Marland Kitchen. glucose blood (FREESTYLE LITE) test strip Use to check blood sugar 6 times per day. Dx Code E11.9 600 each 2  . insulin aspart (NOVOLOG FLEXPEN) 100 UNIT/ML FlexPen Inject up to 20 units a day 15 pen 3  . Insulin Glargine (LANTUS SOLOSTAR) 100 UNIT/ML Solostar Pen Inject 32 Units into the skin daily. 15 pen 3  . Insulin Syringes, Disposable, U-100 1 ML MISC To use for insulin injection.. 100 each 1  . lamoTRIgine (LAMICTAL) 200 MG tablet Take 1 tablet (200 mg total) by mouth daily. 90 tablet 0  . Lancets (FREESTYLE) lancets Use to test blood sugar 4 to 6 times daily as instructed. 600 each 1  . linaclotide (LINZESS) 290 MCG CAPS capsule Take 1 capsule (290 mcg total) by mouth daily before breakfast. 90 capsule 4  . lubiprostone (AMITIZA) 24 MCG capsule Take 1 capsule (24 mcg total) by mouth 2 (two) times daily with a meal. 180 capsule 3  . metFORMIN (GLUCOPHAGE) 500 MG tablet TAKE 1 TABLET TWICE A DAY WITH MEALS 180 tablet 11  . NUCYNTA ER 200 MG TB12     . omeprazole (PRILOSEC) 40 MG capsule Take 1 capsule (40 mg total) by mouth 2 (two) times daily. 180 capsule 3  . ondansetron (ZOFRAN ODT) 4 MG disintegrating tablet Take 1 tablet (4 mg total) by mouth 3 (three) times daily with meals. 90 tablet 4   No current facility-administered medications for this visit.     Allergies as of 03/28/2016 - Review Complete 03/28/2016  Allergen Reaction Noted  . Reglan [metoclopramide] Other (See Comments) 03/23/2013    Vitals: BP (!) 186/95   Pulse 88   Ht 5\' 4"  (1.626 m)   Wt 170 lb 9.6 oz (77.4 kg)   BMI 29.28 kg/m  Last Weight:  Wt Readings from Last 1 Encounters:  03/28/16 170 lb 9.6 oz (77.4 kg)   Last Height:   Ht Readings from Last 1  Encounters:  03/28/16 5\' 4"  (1.626 m)   Physical exam: Exam: Gen: Crying, conversant,  oral dyskinesias possibly tardive dyskinesias             CV: RRR, no MRG. No Carotid Bruits. No peripheral edema, warm, nontender Eyes: Conjunctivae clear without exudates or hemorrhage  Neuro: Detailed Neurologic Exam  Speech:    Speech is normal; fluent  and spontaneous with normal comprehension.  Cognition:  MMSE - Mini Mental State Exam 03/28/2016  Orientation to time 5  Orientation to Place 4  Registration 3  Attention/ Calculation 5  Recall 2  Language- name 2 objects 2  Language- repeat 1  Language- follow 3 step command 3  Language- read & follow direction 1  Write a sentence 1  Copy design 1  Total score 28      The patient is oriented to person, place, and time;     recent and remote memory intact;     language fluent;     normal attention, concentration,     fund of knowledge Cranial Nerves:    The pupils are equal, round, and reactive to light. Attempted funduscopic exam could not visualize due to small pupils. Visual fields are full to finger confrontation. Extraocular movements are intact. Trigeminal sensation is intact and the muscles of mastication are normal. The face is symmetric. The palate elevates in the midline. Hearing intact. Voice is normal. Shoulder shrug is normal. The tongue has normal motion without fasciculations.   Coordination:    Normal finger to nose and heel to shin. Normal rapid alternating movements.   Gait:    Heel-toe normal, tandem gait with mild imbalance, gait appears normal with good strides in no ataxia.   Motor Observation:    No asymmetry, no atrophy, and no involuntary movements noted. Tone:    Normal muscle tone.    Posture:    Posture is normal. normal erect    Strength:    Strength is V/V in the upper and lower limbs.      Sensation: intact to LT. Negative Romberg.     Reflex Exam:  DTR's:    Absent AJs. Otherwise deep  tendon reflexes in the upper and lower extremities are brisk bilaterally.   Toes:    The toes are downgoing bilaterally.   Clonus:    Clonus is absent.       Assessment/Plan:   61 y.o. female here as a referral from Dr. Phillips Odor for memory loss. Past medical history irritable bowel syndrome, hyperlipidemia, hypertension, diabetes, depression, bipolar, anxiety. Patient denies any significant memory changes,denies  family history and complains about her husband most of the appointment today and cries. Husband did not accompany her. There does appear to be psychiatric contributors and considerable psychosocial stressors. Mini-Mental Status exam was 28 out of 30. We'll order an MRI of the brain and labs today. Could consider neuropsychiatric testing however I think in this case it would be very difficult to differentiate between psychiatric and social stressors versus neuro degenerative disease. We'll follow up once in 3 months and we'll discuss MRI of the brain and lab results and will offer patient further testing. After this patient can return yearly for follow-up if needed. Neurologic xam was nonfocal today in her gait appears unremarkable.  Orders Placed This Encounter  Procedures  . MR BRAIN WO CONTRAST  . B12 and Folate Panel  . RPR  . Methylmalonic acid, serum  . HIV antibody (with reflex)    Cc: Colette Ribas, MD  Naomie Dean, MD  [facility] [address] [place], [place] [address]  Phone [phone] Fax [phone]

## 2016-03-29 ENCOUNTER — Telehealth: Payer: Self-pay

## 2016-03-29 ENCOUNTER — Encounter: Payer: Self-pay | Admitting: Neurology

## 2016-03-29 NOTE — Telephone Encounter (Signed)
Called pt w/ normal lab results. May call back w/ any questions/concerns.

## 2016-03-29 NOTE — Telephone Encounter (Signed)
-----   Message from Anson FretAntonia B Ahern, MD sent at 03/29/2016 11:17 AM EDT ----- Labs normal thanks

## 2016-03-29 NOTE — Telephone Encounter (Signed)
Pt called back and I shared the results with her, she was glad to hear the results but would like a call back to know what she was tested for.  Pt said she will be home all day

## 2016-03-29 NOTE — Telephone Encounter (Signed)
Returned pt TC and reviewed labs w/ pt. Verbalized understanding and appreciation for call.

## 2016-03-31 LAB — METHYLMALONIC ACID, SERUM: METHYLMALONIC ACID: 237 nmol/L (ref 0–378)

## 2016-03-31 LAB — B12 AND FOLATE PANEL
Folate: 15 ng/mL (ref >3.0)
VITAMIN B 12: 461 pg/mL (ref 232–1245)

## 2016-03-31 LAB — HIV ANTIBODY (ROUTINE TESTING W REFLEX): HIV Screen 4th Generation wRfx: NONREACTIVE

## 2016-03-31 LAB — RPR: RPR: NONREACTIVE

## 2016-04-11 ENCOUNTER — Other Ambulatory Visit: Payer: Self-pay

## 2016-04-11 MED ORDER — INSULIN ASPART 100 UNIT/ML FLEXPEN: PEN_INJECTOR | SUBCUTANEOUS | 3 refills | Status: DC

## 2016-04-11 MED ORDER — INSULIN GLARGINE 100 UNIT/ML SOLOSTAR PEN
32.0000 [IU] | PEN_INJECTOR | Freq: Every day | SUBCUTANEOUS | 3 refills | Status: DC
Start: 2016-04-11 — End: 2016-04-11

## 2016-04-11 MED ORDER — INSULIN GLARGINE 100 UNIT/ML SOLOSTAR PEN: PEN_INJECTOR | SUBCUTANEOUS | 3 refills | Status: DC

## 2016-04-11 NOTE — Telephone Encounter (Signed)
Please call in new rx for the pt's lantus and the [facility]olog [facility]

## 2016-04-11 NOTE — Telephone Encounter (Signed)
Submitted

## 2016-04-14 ENCOUNTER — Other Ambulatory Visit: Payer: Self-pay

## 2016-04-16 ENCOUNTER — Ambulatory Visit (HOSPITAL_COMMUNITY): Payer: Self-pay | Admitting: Psychiatry

## 2016-04-16 ENCOUNTER — Ambulatory Visit (INDEPENDENT_AMBULATORY_CARE_PROVIDER_SITE_OTHER): Admitting: Psychiatry

## 2016-04-16 ENCOUNTER — Encounter (HOSPITAL_COMMUNITY): Payer: Self-pay | Admitting: Psychiatry

## 2016-04-16 DIAGNOSIS — Z818 Family history of other mental and behavioral disorders: Secondary | ICD-10-CM | POA: Diagnosis not present

## 2016-04-16 DIAGNOSIS — Z79899 Other long term (current) drug therapy: Secondary | ICD-10-CM

## 2016-04-16 DIAGNOSIS — F3131 Bipolar disorder, current episode depressed, mild: Secondary | ICD-10-CM

## 2016-04-16 DIAGNOSIS — F411 Generalized anxiety disorder: Secondary | ICD-10-CM

## 2016-04-16 DIAGNOSIS — Z794 Long term (current) use of insulin: Secondary | ICD-10-CM

## 2016-04-16 MED ORDER — BUSPIRONE HCL 5 MG PO TABS: 5.0000 mg | ORAL_TABLET | Freq: Two times a day (BID) | ORAL | 0 refills | Status: DC

## 2016-04-16 MED ORDER — LAMOTRIGINE 200 MG PO TABS: 200.0000 mg | ORAL_TABLET | Freq: Every day | ORAL | 0 refills | Status: DC

## 2016-04-16 MED ORDER — FLUOXETINE HCL 40 MG PO CAPS
40.0000 mg | ORAL_CAPSULE | Freq: Every day | ORAL | 0 refills | Status: DC
Start: 2016-04-16 — End: 2016-07-27

## 2016-04-16 MED ORDER — DIAZEPAM 5 MG PO TABS: 5.0000 mg | ORAL_TABLET | Freq: Three times a day (TID) | ORAL | 0 refills | Status: DC

## 2016-04-16 NOTE — Progress Notes (Signed)
[facility] MD/PA/NP OP Progress Note  04/16/2016 2:06 PM [name]  MRN:  [id number]  Chief Complaint:  Chief Complaint    Follow-up     Subjective:  I am feeling better.  Some days I don't take BuSpar.  HPI: [name] came for her follow-up appointment.  She is feeling better overall and denies any major episode of anger or agitation.  We started her on BuSpar and she is taking but there are times when she skips a dose because she feels fine and calmer.  She started seeing therapist and so far seen twice.  She feels therapy is finally helping her.  She is seeing [name] therapy.  She is sleeping better.  She also noticed that her husband is more supportive and taken to the outlet malls on weekends and bought her washer and dryer.  She is very happy about it.  She also told that husband promised to take her to [place] very soon.  She denies any recent arguments with her.  She denies any crying spells or any feeling of hopelessness or worthlessness.  She continues to have somatic complaints and sometime feeling nausea, joint pain, arthritis but overall they are less intense and less frequent.  She recently seen neurologist for memory issues and she feels all her blood tests were normal and she does not believe anything wrong with her brain.  She was not prescribed any new medication.  Her appetite is okay.  Her energy level is good.  She denies any suicidal thoughts or homicidal thought.  She continues to struggle with blood sugar but lately she feels more relaxed calm and does not want to change her medication.  She is taking Valium 5 mg twice a day and some time third as needed.  She also taking Lamictal and Prozac and denied any tremors shakes rash or any itching. Visit Diagnosis:    ICD-9-CM ICD-10-CM   1. Bipolar affective disorder, currently depressed, mild (HCC) 296.51 F31.31 diazepam (VALIUM) 5 MG tablet     FLUoxetine (PROZAC) 40 MG capsule     lamoTRIgine (LAMICTAL) 200 MG tablet  2.  Generalized anxiety disorder 300.02 F41.1 diazepam (VALIUM) 5 MG tablet     busPIRone (BUSPAR) 5 MG tablet    Past Psychiatric History: Reviewed. Patient is started seeing psychiatrist [name] at age 61 because of poor impulse control, mania, irritability and mood swing.  Then she see Dr. Tomasa [name] for more than 15 years.  In the past she had tried lithium but she developed lithium toxicity.  She also had tried Seroquel, Abilify, Depakote, Lexapro, Zoloft, Paxil, Ativan and Risperdal.  We also tried trazodone and Vistaril but she did not like the side effects.  Patient endorse history of mania and severe mood swings.  She has one psychiatric hospitalization due to mania.  Patient denies any history of suicidal attempt.  Past Medical History:  Past Medical History:  Diagnosis Date  . Anxiety   . Bipolar 1 disorder (HCC)   . Breast density 06/25/2012   Right breast density, will get mammogram and [place]  . Depression   . Diabetes mellitus without complication (HCC)   . Duodenitis   . HTN (hypertension)   . Hyperlipidemia   . IBS (irritable bowel syndrome)     Past Surgical History:  Procedure Laterality Date  . ABDOMINAL HYSTERECTOMY    . CHOLECYSTECTOMY    . COLONOSCOPY      Family Psychiatric History: Reviewed.  Family History:  Family History  Problem Relation Age of Onset  . Hypertension Mother   . Bipolar disorder Mother   . Cancer Mother   . Heart disease Brother   . Thyroid disease Sister   . Hypertension Sister   . Bipolar disorder Sister   . Depression Father   . Cancer Father   . Bipolar disorder Daughter   . Bipolar disorder Maternal Aunt   . Colon cancer Neg Hx   . Colon polyps Neg Hx   . Esophageal cancer Neg Hx   . Kidney disease Neg Hx   . Gallbladder disease Neg Hx   . Diabetes Neg Hx   . Dementia Neg Hx     Social History:  Social History   Social History  . Marital status: Married    Spouse name: N/A  . Number of children: 1  . Years of  education: 73   Occupational History  . Disabled    Social History Main Topics  . Smoking status: Never Smoker  . Smokeless tobacco: Never Used  . Alcohol use No  . Drug use: No  . Sexual activity: Not Currently   Other Topics Concern  . Not on file   Social History Narrative   Lives at home w/ her husband   Left-handed   Caffeine: 1-2 cups of coffee daily    Allergies:  Allergies  Allergen Reactions  . Reglan [Metoclopramide] Other (See Comments)    Chest pains    Metabolic Disorder Labs: Recent Results (from the past 2160 hour(s))  B12 and Folate Panel     Status: None   Collection Time: 03/28/16  4:48 PM  Result Value Ref Range   Vitamin B-12 461 232 - 1,245 pg/mL   Folate 15.0 >3.0 ng/mL    Comment: A serum folate concentration of less than 3.1 ng/mL is considered to represent clinical deficiency.   RPR     Status: None   Collection Time: 03/28/16  4:48 PM  Result Value Ref Range   RPR Ser Ql Non Reactive Non Reactive  Methylmalonic acid, serum     Status: None   Collection Time: 03/28/16  4:48 PM  Result Value Ref Range   Methylmalonic Acid 237 0 - 378 nmol/L    Comment: This test was developed and its performance characteristics determined by [facility]. It has not been cleared or approved by the Food and Drug Administration.   HIV antibody (with reflex)     Status: None   Collection Time: 03/28/16  4:48 PM  Result Value Ref Range   HIV Screen 4th Generation wRfx Non Reactive Non Reactive   Lab Results  Component Value Date   HGBA1C 4.6 07/16/2013   MPG 85 07/16/2013   MPG 91 04/15/2012   No results found for: PROLACTIN Lab Results  Component Value Date   CHOL 206 (H) 08/24/2015   TRIG 109.0 08/24/2015   HDL 51.60 08/24/2015   CHOLHDL 4 08/24/2015   VLDL 21.8 08/24/2015   LDLCALC 133 (H) 08/24/2015   LDLCALC 98 09/01/2014     Current Medications: Current Outpatient Prescriptions  Medication Sig Dispense Refill  . AMBULATORY NON FORMULARY  MEDICATION Squatty Potty x 1 1 each 0  . amLODipine (NORVASC) 5 MG tablet Take 5 mg by mouth daily.    . BD PEN NEEDLE NANO U/F 32G X 4 MM MISC Use with insulins. 100 each 5  . BENICAR 40 MG tablet     . busPIRone (BUSPAR) 5 MG tablet Take 1 tablet (5 mg  total) by mouth 2 (two) times daily. 180 tablet 0  . diazepam (VALIUM) 5 MG tablet Take 1 tablet (5 mg total) by mouth 3 (three) times daily. 270 tablet 0  . FLUoxetine (PROZAC) 40 MG capsule Take 1 capsule (40 mg total) by mouth daily. 90 capsule 0  . gabapentin (NEURONTIN) 800 MG tablet Take 800 mg by mouth. 1 tablet in the morning and 2 tablets at bedtime.    Marland Kitchen glucose blood (FREESTYLE LITE) test strip Use to check blood sugar 6 times per day. Dx Code E11.9 600 each 2  . insulin aspart (NOVOLOG FLEXPEN) 100 UNIT/ML FlexPen Inject up to 20 units a day, also including sliding scale. 15 pen 3  . Insulin Glargine (LANTUS SOLOSTAR) 100 UNIT/ML Solostar Pen Inject 32 units daily including sliding scale 15 pen 3  . Insulin Syringes, Disposable, U-100 1 ML MISC To use for insulin injection.. 100 each 1  . lamoTRIgine (LAMICTAL) 200 MG tablet Take 1 tablet (200 mg total) by mouth daily. 90 tablet 0  . Lancets (FREESTYLE) lancets Use to test blood sugar 4 to 6 times daily as instructed. 600 each 1  . linaclotide (LINZESS) 290 MCG CAPS capsule Take 1 capsule (290 mcg total) by mouth daily before breakfast. 90 capsule 4  . lubiprostone (AMITIZA) 24 MCG capsule Take 1 capsule (24 mcg total) by mouth 2 (two) times daily with a meal. 180 capsule 3  . metFORMIN (GLUCOPHAGE) 500 MG tablet TAKE 1 TABLET TWICE A DAY WITH MEALS 180 tablet 11  . NUCYNTA ER 200 MG TB12     . omeprazole (PRILOSEC) 40 MG capsule Take 1 capsule (40 mg total) by mouth 2 (two) times daily. 180 capsule 3  . ondansetron (ZOFRAN ODT) 4 MG disintegrating tablet Take 1 tablet (4 mg total) by mouth 3 (three) times daily with meals. 90 tablet 4   No current facility-administered medications  for this visit.     Neurologic: Headache: No Seizure: No Paresthesias: Yes  Musculoskeletal: Strength & Muscle Tone: within normal limits Gait & Station: normal Patient leans: N/A  Psychiatric Specialty Exam: ROS  Blood pressure 140/78, pulse 97, height  (1.626 m), weight 168 lb 9.6 oz (76.5 kg).There is no height or weight on file to calculate BMI.  General Appearance: Casual  Eye Contact:  Good  Speech:  Clear and Coherent  Volume:  Normal  Mood:  Euthymic  Affect:  Appropriate  Thought Process:  Coherent  Orientation:  Full (Time, Place, and Person)  Thought Content: WDL and Logical   Suicidal Thoughts:  No  Homicidal Thoughts:  No  Memory:  Immediate;   Fair Recent;   Fair Remote;   Fair  Judgement:  Fair  Insight:  Good  Psychomotor Activity:  Normal  Concentration:  Concentration: Fair and Attention Span: Fair  Recall:  Good  Fund of Knowledge: Good  Language: Good  Akathisia:  No  Handed:  Right  AIMS (if indicated):  0  Assets:  Communication Skills Desire for Improvement Housing Social Support  ADL's:  Intact  Cognition: WNL  Sleep:  Improved     Assessment: Bipolar disorder type I.  Anxiety disorder NOS.  Plan: Patient is doing better from the past.  Since started BuSpar she has seen much improvement in her anxiety.  She is taking Valium twice a day and some time third time.  She also admitted that she does not take BuSpar every day however I reminded that in order to get a  good efficacy she had to take BuSpar every day.  I have noticed since taking BuSpar and seeing therapist Page Luan Pulling she is improving.  I encourage continue counseling.  Continue Lamictal 200 mg daily, Prozac 40 mg daily and BuSpar 5 mg twice a day and Valium 5 mg twice a day and third as needed.  Discussed medication side effects and benefits.  I reviewed blood work results which was recently done including B12 and RPR.  Recommended to call us back if she has any question,  concern or if she feels worsening of the symptom.  Follow-up in 3 months. Solan Vosler T., MD 04/16/2016, 2:06 PM

## 2016-04-17 NOTE — Telephone Encounter (Signed)
She has an appointment in 3 days. Unless her sugars are consistently very high or very low, I will address this with her when she comes to see me.

## 2016-04-17 NOTE — Telephone Encounter (Signed)
Patient is having problems with her b/s no matter what she does right her b/s is never stable and she thinks she is going to die.  Please advise

## 2016-04-17 NOTE — Telephone Encounter (Signed)
Please see message below, please advise 

## 2016-04-20 ENCOUNTER — Ambulatory Visit (INDEPENDENT_AMBULATORY_CARE_PROVIDER_SITE_OTHER): Admitting: Internal Medicine

## 2016-04-20 VITALS — BP 138/70 | HR 97 | Temp 98.3°F | Resp 17 | Ht 64.0 in | Wt 171.4 lb

## 2016-04-20 DIAGNOSIS — E139 Other specified diabetes mellitus without complications: Secondary | ICD-10-CM

## 2016-04-20 DIAGNOSIS — E109 Type 1 diabetes mellitus without complications: Secondary | ICD-10-CM | POA: Diagnosis not present

## 2016-04-20 NOTE — Patient Instructions (Addendum)
Please change the insulin doses as follows: - Lantus 32 units at bedtime - Novolog: 7-9 units depending on the size of your meal - Novolog Sliding scale: - 150-200: + 1 unit 201-250: + 2 units 251-300: + 3 units 301-350: + 4 units >350: + 5 units  Continue: - Metformin 500 mg 2x a day  Please return in 3 months with your sugar log.

## 2016-04-20 NOTE — Progress Notes (Signed)
Pre visit review using our clinic review tool, if applicable. No additional management support is needed unless otherwise documented below in the visit note. 

## 2016-04-20 NOTE — Progress Notes (Signed)
Patient ID: [name], female   DOB: 1955-11-01, 61 y.o.   MRN: [id number]  HPI: [name] is a 61 y.o.-year-old female, initially referred by her PCP, Dr. Assunta Found (PA: Lenise Herald), returning for f/u for LADA, dx 2005, insulin-dependent since ~2006, controlled, with complications (PN, gastroparesis?). Last visit 3 mo ago.  Reviewed and addended history: She was on an Omnipod insulin pump on 08/01/2015. She had severe anxiety and depression and got so overwhelmed with the insulin pump that we had to take her off the pump.   We switched her to a basal-bolus insulin regimen, but she was unable to calculate the insulin doses based on insulin to carb ratio, so now she is using fixed rapid acting insulin doses.  Previous fructosamine levels: 12/15/2015: HbA1c calculated from the fructosamine is higher, 8.4%. 08/24/2015:  HbA1c calculated from fructosamine is 7.4%. 05/24/2015: HbA1c calculated from fructosamine is 6.9%. ... Lab Results  Component Value Date   HGBA1C 4.6 07/16/2013   HGBA1C sent to [facility] 07/16/2013   HGBA1C 4.8 04/15/2012  - 02/12/2013: 4.2% - 12/19/2012: 5.2% - 09/18/2012: 6.5%  She is now on (since 01/17/2016): - Metformin 500 mg 2x a day  - Lantus 32 >> 34 units at night - NovoLog mealtime: 6 >> 7 >> 8 units before a smaller meal 7 >> 8 >> 9 units before a regular meal 8 >> 9 >> 10 units before a larger meal -  Novolog SSI - 150-175: + 1 unit  - 176-200: + 2 units  - 201-225: + 3 units  - 226-250: + 4 units  - >250: + 5 units  Pt checks her sugars 4x a day (reviewed log): - am:  116-202, 229 >> 107-195, 202 >> 97-324 >> 150-190 >> 125-190, 232 - 2h after b'fast: 80-190 >> 92-236 >> 128-146 >> n/c >> 215 - before lunch:89-166 >> 48, 71-154, 194 >> 90-373 >> 203 >> 51, 55-215 - 2h after lunch: 151-196 >> n/c >>84-162 >> 67-HI >> n/c >> 46, 58  - before dinner:  62-252, 297 >> 121-311 >> cannot remember >> 40, 60, 74-132 - 2h after dinner: 107-277  >> 92-212 >> 211-365 >> 162 >> n/c - bedtime: 68, 99-231 >> 119-162 >> see above >> 140-180 >> 47, 75-187, 249 - at night: 141-185 >> 187 >> 111-169, 280 >> 179-320 >> 116 >> 82, 250 Has lows. Lowest sugar was 51 >> 34 and 38 >> 49 >> 89 >> 48 >> 51 >> 33 (when she was sick at not eat) >> 40; she has hypoglycemia awareness at 50. Highest sugar was 347 >> 302 >> 200s >> 295 >> HI x1 >> 300s, not as many after stopping the pump.  Pt's meals are: - Breakfast: toast, bowl of cereal + 2% milk or oatmeal - lately (no nuts - sannot chew them) - Lunch: soup, sometimes sandwich, vegetables - Dinner: chicken/pork/beef + vegetable, no starch - Snacks: sugar free sweets  - no CKD, last BUN/creatinine:  Lab Results  Component Value Date   BUN 15 08/24/2015   CREATININE 0.59 08/24/2015   - Lipids: Lab Results  Component Value Date   CHOL 206 (H) 08/24/2015   HDL 51.60 08/24/2015   LDLCALC 133 (H) 08/24/2015   TRIG 109.0 08/24/2015   CHOLHDL 4 08/24/2015   Not on a statin.   - last eye exam was in [place] in 12/2014. No DR. + small cataract. Has appt 06/19/2016.  Office Visit on 02/23/2015  Component Date Value  Ref Range Status  . HM Diabetic Eye Exam 12/15/2014 No Retinopathy  No Retinopathy Final   - + numbness and tingling in her feet. On Neurontin.  She is seeing a podiatrist [facility]). She will need to have 2 toenails removed due to fungal inf.  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 Also: GERD, HTN, HL, Fibromyalgia >> on Gabapentin.  Last TSH: Lab Results  Component Value Date   TSH 0.81 08/24/2015   I reviewed pt's medications, allergies, PMH, social hx, family hx, and changes were documented in the history of present illness. Otherwise, unchanged from my initial visit note.   ROS: Constitutional: + wt gain, + fatigue, + subjective hyperthermia, + nocturia Eyes: no blurry vision, no  xerophthalmia ENT: no sore throat, no nodules palpated in throat, no dysphagia/odynophagia, no hoarseness Cardiovascular: no CP/SOB/palpitations/leg swelling Respiratory: no cough/SOB Gastrointestinal: + N/no V/D/+ C Musculoskeletal: + muscle/no joint aches Skin: no rashes, + hair loss Neurological: no tremors/numbness/tingling/dizziness, + HA  PE: BP 138/70 (BP Location: Right Arm, Patient Position: Sitting, Cuff Size: Normal)   Pulse 97   Temp 98.3 F (36.8 C) (Oral)   Resp 17   Ht  (1.626 m)   Wt 171 lb 6.4 oz (77.7 kg)   SpO2 95%   BMI 29.42 kg/m    Wt Readings from Last 3 Encounters:  04/20/16 171 lb 6.4 oz (77.7 kg)  04/16/16 168 lb 9.6 oz (76.5 kg)  03/28/16 170 lb 9.6 oz (77.4 kg)   Constitutional: overweight, in NAD Eyes: PERRLA, EOMI, no exophthalmos ENT: moist mucous membranes, no thyromegaly, no cervical lymphadenopathy Cardiovascular: tachycardia, RR, No MRG Respiratory: CTA B Gastrointestinal: abdomen soft, NT, ND, BS+ Musculoskeletal: no deformities, strength intact in all 4 Skin: moist, warm, no rashes Neurological: no tremor with outstretched hands, DTR normal in all 4  ASSESSMENT: 1. LADA, insulin-dependent, uncontrolled, with complications - peripheral neuropathy - gastroparesis?  Component     Latest Ref Rng 07/16/2013  C-Peptide     0.80 - 3.90 ng/mL 1.29  Glucose     70 - 99 mg/dL 191 (H)  Glutamic Acid Decarb Ab     <=1.0 U/mL 11.3 (H)  Pancreatic Islet Cell Antibody     <5 JDF Units 5 (A)  + anti-pancreatic antibodies >> LADA rather than type 2 DM. She still has a positive C peptide >> still has insulin secretion. For this reason, we continued the Metformin.  PLAN:  1. Patient with LADA, on basal-bolus + oral antidiabetic regimen, with initially better sugars after starting the insulin pump, but then worsened parallel with her increased anxiety/depression. Her condition got to the point that we needed to stop the pump and start  basal-bolus insulin regimen ~10 days ago. We gave her a fixed regimen of NovoLog to avoid further anxiety from needing to calculate the dose based on insulin to carb ratios. At last visit, we increased her doses slightly. Since then, she increased them even further. Also, she received a written sliding scale, so she does not need to calculate this from CBG target and sensitivity factor. She is using this consistently. - At this visit, her sugars are still fluctuating, with slightly more low blood sugars in the middle of the day, and she always uses 8 units of NovoLog regardless of the size of the meals. We will decrease this to 7 units, but I did advise her to increase  the doses if she has larger meals. Since she may drop her sugars after correction of a high blood sugar, we'll also increase her sensitivity factor from 25 to 50. -  I suggested to:  Patient Instructions  Please change the insulin doses as follows: - Lantus 32 units at bedtime - Novolog: 7-9 units depending on the size of your meal - Novolog Sliding scale: - 150-200: + 1 unit 201-250: + 2 units 251-300: + 3 units 301-350: + 4 units >350: + 5 units  Continue: - Metformin 500 mg 2x a day  Please return in 3 months with your sugar log.   - Continue to check sugars at different times of the day, 4 times a day, rotating check times - She needs a new eye exam, this is scheduled - She had the flu shot this season - We'll check a fructosamine today - Return to see me in 3 months  Office Visit on 04/20/2016  Component Date Value Ref Range Status  . Fructosamine 04/20/2016 333* 190 - 270 umol/L Final   HbA1c calculated from fructosamine is better, at 7.25%  Carlus Pavlov, MD PhD [facility] Endocrinology

## 2016-04-23 ENCOUNTER — Encounter: Payer: Self-pay | Admitting: Internal Medicine

## 2016-04-23 LAB — FRUCTOSAMINE: FRUCTOSAMINE: 333 umol/L — AB (ref 190–270)

## 2016-04-23 NOTE — Telephone Encounter (Signed)
Office visit completed with Dr. Elvera Lennox on 04/20/16.

## 2016-04-24 ENCOUNTER — Telehealth: Payer: Self-pay

## 2016-04-24 NOTE — Telephone Encounter (Signed)
LVM, gave lab results. Gave call back number if any questions or concerns.  

## 2016-04-24 NOTE — Telephone Encounter (Signed)
-----   Message from Carlus Pavlov, MD sent at 04/23/2016  5:35 PM EDT ----- Raynelle Fanning, can you please call pt:  HbA1c calculated from fructosamine is better, at 7.25%

## 2016-05-03 ENCOUNTER — Telehealth: Payer: Self-pay | Admitting: Internal Medicine

## 2016-05-03 ENCOUNTER — Telehealth: Payer: Self-pay

## 2016-05-03 NOTE — Telephone Encounter (Signed)
Patient ask if [name] is in net work with us. Please advise

## 2016-05-03 NOTE — Telephone Encounter (Signed)
Called and advised patient that we were covered by [facility]. Advised with supervisor to make sure, and we are. No other questions at this time.

## 2016-05-03 NOTE — Telephone Encounter (Signed)
Called and advised patient that we were covered by [employer]. Advised with supervisor to make sure, and we are. No other questions at this time.   

## 2016-05-09 ENCOUNTER — Telehealth: Payer: Self-pay

## 2016-05-09 NOTE — Telephone Encounter (Signed)
Called and advised of note from Dr.Gherghe, patient understood, and wrote it down while on the phone. No questions at this time.  

## 2016-05-09 NOTE — Telephone Encounter (Signed)
Does she know what could have cause this? Is she sick? Did she start steroids or ABx? If not, change the Novolog insulin doses as follows: - Lantus 32 units at bedtime - Novolog: 7-9 >> 9-12 units depending on the size of the meal - Novolog Sliding scale: 150-200: + 1 unit 201-250: + 2 units 251-300: + 3 units 301-350: + 4 units >350: + 5 units  Continue: - Metformin 500 mg 2x a day

## 2016-05-09 NOTE — Telephone Encounter (Signed)
Patient called, stated that she has been having some high sugars.  5/6- 216am 5/7- 318am 5/8- 353am  5/9- 245am  All before breakfast sugars.  5/9 before lunch today it was 316.  Patient is worried.   Please advise. Thank you!

## 2016-05-09 NOTE — Telephone Encounter (Signed)
Called and advised of note from Dr.Gherghe, patient understood, and wrote it down while on the phone. No questions at this time.

## 2016-05-10 ENCOUNTER — Telehealth: Payer: Self-pay | Admitting: Gastroenterology

## 2016-05-11 ENCOUNTER — Encounter: Payer: Self-pay | Admitting: Gastroenterology

## 2016-05-11 ENCOUNTER — Ambulatory Visit (INDEPENDENT_AMBULATORY_CARE_PROVIDER_SITE_OTHER): Admitting: Gastroenterology

## 2016-05-11 VITALS — BP 120/70 | HR 90 | Ht 64.0 in | Wt 166.0 lb

## 2016-05-11 DIAGNOSIS — Z1211 Encounter for screening for malignant neoplasm of colon: Secondary | ICD-10-CM

## 2016-05-11 DIAGNOSIS — K5902 Outlet dysfunction constipation: Secondary | ICD-10-CM | POA: Diagnosis not present

## 2016-05-11 DIAGNOSIS — K581 Irritable bowel syndrome with constipation: Secondary | ICD-10-CM | POA: Diagnosis not present

## 2016-05-11 MED ORDER — LINACLOTIDE 290 MCG PO CAPS: 290.0000 ug | ORAL_CAPSULE | Freq: Every day | ORAL | 4 refills | Status: DC

## 2016-05-11 NOTE — Patient Instructions (Addendum)
Your provider has ordered Cologuard testing as an option for colon cancer screening. This is performed by [facility] and may be out of network with your insurance. PRIOR to completing the test, it is YOUR responsibility to contact your insurance about covered benefits for this test. Your out of pocket expense could be anywhere from $0.00 to $649.00.   When you call to check coverage with your insurer, please provide the following information:   -The ONLY provider of Cologuard is [facility]  - CPT code for Cologuard is [id number].  [facility] NPI # [id number]  -[facility] Tax ID # [id number]   We have already sent your demographic and insurance information to [facility] (phone number [phone]) and they should contact you within the next week regarding your test. If you have not heard from them within the next week, please call our office at [phone].  We will refer you to Eulis Fosterheryl Gray for physical therapy  Miralax 1 capful daily at bedtime

## 2016-05-11 NOTE — Progress Notes (Signed)
[name]    [id number]    [birth year]  Primary Care Physician:Golding, Jonny Ruiz, MD  Referring Physician: Assunta Found, MD [address] [place], [place] [address]  Chief complaint: Constipation  HPI: 61 year old female with history of chronic depression, anxiety disorder, fibromyalgia, GERD, irritable bowel syndrome predominant constipation here for follow-up visit Patient continues to have persistent constipation despite linzess 290 g daily. She is having bowel movement 3-4 times a week with sensation of incomplete evacuation. She continues to suffer with severe depression,  few days a week she doesn't feel like eating anything. She has a therapist and psychiatric  with close monitoring. Patient denies any suicidal or homicidal ideation. She has intermittent lower abdominal cramps. Denies any blood per rectum. Weight has been stable   Outpatient Encounter Prescriptions as of 05/11/2016  Medication Sig  . AMBULATORY NON FORMULARY MEDICATION Squatty Potty x 1  . amLODipine (NORVASC) 5 MG tablet Take 5 mg by mouth daily.  . BD PEN NEEDLE NANO U/F 32G X 4 MM MISC Use with insulins.  Marland Kitchen BENICAR 40 MG tablet   . busPIRone (BUSPAR) 5 MG tablet Take 1 tablet (5 mg total) by mouth 2 (two) times daily.  . diazepam (VALIUM) 5 MG tablet Take 1 tablet (5 mg total) by mouth 3 (three) times daily.  Marland Kitchen FLUoxetine (PROZAC) 40 MG capsule Take 1 capsule (40 mg total) by mouth daily.  Marland Kitchen gabapentin (NEURONTIN) 800 MG tablet Take 800 mg by mouth. 1 tablet in the morning and 2 tablets at bedtime.  Marland Kitchen glucose blood (FREESTYLE LITE) test strip Use to check blood sugar 6 times per day. Dx Code E11.9  . insulin aspart (NOVOLOG FLEXPEN) 100 UNIT/ML FlexPen Inject up to 20 units a day, also including sliding scale.  . Insulin Glargine (LANTUS SOLOSTAR) 100 UNIT/ML Solostar Pen Inject 32 units daily including sliding scale  . Insulin Syringes, Disposable, U-100 1 ML MISC To use for insulin  injection..  . lamoTRIgine (LAMICTAL) 200 MG tablet Take 1 tablet (200 mg total) by mouth daily.  . Lancets (FREESTYLE) lancets Use to test blood sugar 4 to 6 times daily as instructed.  . linaclotide (LINZESS) 290 MCG CAPS capsule Take 1 capsule (290 mcg total) by mouth daily before breakfast.  . metFORMIN (GLUCOPHAGE) 500 MG tablet TAKE 1 TABLET TWICE A DAY WITH MEALS  . NUCYNTA ER 200 MG TB12   . omeprazole (PRILOSEC) 40 MG capsule Take 1 capsule (40 mg total) by mouth 2 (two) times daily.  . ondansetron (ZOFRAN ODT) 4 MG disintegrating tablet Take 1 tablet (4 mg total) by mouth 3 (three) times daily with meals.  . [DISCONTINUED] lubiprostone (AMITIZA) 24 MCG capsule Take 1 capsule (24 mcg total) by mouth 2 (two) times daily with a meal. (Patient not taking: Reported on 04/20/2016)   No facility-administered encounter medications on file as of 05/11/2016.     Allergies as of 05/11/2016 - Review Complete 05/11/2016  Allergen Reaction Noted  . Reglan [metoclopramide] Other (See Comments) 03/23/2013    Past Medical History:  Diagnosis Date  . Anxiety   . Bipolar 1 disorder (HCC)   . Breast density 06/25/2012   Right breast density, will get mammogram and [place]  . Depression   . Diabetes mellitus without complication (HCC)   . Duodenitis   . HTN (hypertension)   . Hyperlipidemia   . IBS (irritable bowel syndrome)     Past Surgical History:  Procedure Laterality  Date  . ABDOMINAL HYSTERECTOMY    . CHOLECYSTECTOMY    . COLONOSCOPY      Family History  Problem Relation Age of Onset  . Hypertension Mother   . Bipolar disorder Mother   . Cancer Mother   . Heart disease Brother   . Thyroid disease Sister   . Hypertension Sister   . Bipolar disorder Sister   . Depression Father   . Cancer Father   . Bipolar disorder Daughter   . Bipolar disorder Maternal Aunt   . Colon cancer Neg Hx   . Colon polyps Neg Hx   . Esophageal cancer Neg Hx   . Kidney disease Neg Hx   .  Gallbladder disease Neg Hx   . Diabetes Neg Hx   . Dementia Neg Hx     Social History   Social History  . Marital status: Married    Spouse name: N/A  . Number of children: 1  . Years of education: 60   Occupational History  . Disabled    Social History Main Topics  . Smoking status: Never Smoker  . Smokeless tobacco: Never Used  . Alcohol use No  . Drug use: No  . Sexual activity: Not Currently   Other Topics Concern  . Not on file   Social History Narrative   Lives at home w/ her husband   Left-handed   Caffeine: 1-2 cups of coffee daily      Review of systems: Review of Systems  Constitutional: Negative for fever and chills.  HENT: Negative.   Eyes: Negative for blurred vision.  Respiratory: Negative for cough, shortness of breath and wheezing.   Cardiovascular: Negative for chest pain and palpitations.  Gastrointestinal: as per HPI Genitourinary: Negative for dysuria, urgency, frequency and hematuria.  Musculoskeletal: Positive for myalgias, back pain and joint pain.  Skin: Negative for itching and rash.  Neurological: Negative for dizziness, tremors, focal weakness, seizures and loss of consciousness.  Endo/Heme/Allergies: Positive for seasonal allergies.  Psychiatric/Behavioral: Positive for depression and anxiety, negative for suicidal ideas and hallucinations.  All other systems reviewed and are negative.   Physical Exam: Vitals:   05/11/16 1426  BP: 120/70  Pulse: 90   Body mass index is 28.49 kg/m. Gen:      No acute distress HEENT:  EOMI, sclera anicteric Neck:     No masses; no thyromegaly Lungs:    Clear to auscultation bilaterally; normal respiratory effort CV:         Regular rate and rhythm; no murmurs Abd:      + bowel sounds; soft, non-tender; no palpable masses, no distension Ext:    No edema; adequate peripheral perfusion Skin:      Warm and dry; no rash Neuro: alert and oriented x 3 Psych: normal mood and affect  Data  Reviewed:  Reviewed labs, radiology imaging, old records and pertinent past GI work up   Assessment and Plan/Recommendations:  61 year old female with history of chronic depression, anxiety disorder, fibromyalgia, diabetes on insulin, irritable bowel syndrome predominant constipation with pelvic floor dysfunction here for follow-up visit  Patient did have some improvement after she had pelvic floor PT by Eulis Foster We will refer patient back for a few additional sessions  Continue Linzess 290 g daily and add MiraLAX one capful daily at bedtime Advised patient to increase dietary fluid and fiber intake  Colorectal cancer screening: Average risk  Sigmoidoscopy in 1998, no recent colonoscopy Patient is extremely reluctant to undergo screening colonoscopy  She agreed to do Cologaurd instead  25 minutes was spent face-to-face with the patient. Greater than 50% of the time used for counseling as well as treatment plan and follow-up of constipation. She had multiple questions which were answered to her satisfaction  K. Scherry RanVeena Nandigam , MD [phone] Mon-Fri 8a-5p [phone] after 5p, weekends, holidays  CC: Assunta FoundGolding, John, MD

## 2016-05-13 DIAGNOSIS — K5909 Other constipation: Secondary | ICD-10-CM | POA: Insufficient documentation

## 2016-05-13 DIAGNOSIS — K5902 Outlet dysfunction constipation: Secondary | ICD-10-CM | POA: Insufficient documentation

## 2016-05-17 ENCOUNTER — Telehealth (HOSPITAL_COMMUNITY): Payer: Self-pay

## 2016-05-17 NOTE — Telephone Encounter (Signed)
Patient called, her insurance has changed and so have her co-pays. Patient is currently seeing a new therapist who has taught her how to meditate and is helping her with coping skills. Patient is wondering if you would be willing to see her every 6 months instead of every 3, she said with co-pays being more expensive and she is also seeing internal medicine, gastro, and neurology she is having trouble paying all of these. Patient feels if she can lessen her appointments it would be less stress for her. She said she is doing well in therapy (patient did sound very upbeat and happy) and she will discuss this more with you at her appointment next month.

## 2016-05-18 ENCOUNTER — Telehealth: Payer: Self-pay | Admitting: Gastroenterology

## 2016-05-21 ENCOUNTER — Telehealth: Payer: Self-pay | Admitting: Internal Medicine

## 2016-05-21 ENCOUNTER — Telehealth: Payer: Self-pay

## 2016-05-21 NOTE — Telephone Encounter (Signed)
Submitted to MD to look over.

## 2016-05-21 NOTE — Telephone Encounter (Signed)
Patient b/s readings for today  05/21/16  6 am -165 8 am -165 10 am - 62 10:30 am -99 ate breakfast 1:30 pm - 61

## 2016-05-21 NOTE — Telephone Encounter (Signed)
That is unusual. We can increase the Lantus at night slightly, by 2-4 units, if she feels that her sugars at bedtime are good but they increase during the night. She may also try to move the entire dose of metformin (2 tablets) with dinner. Let us know how this works in several days.

## 2016-05-21 NOTE — Telephone Encounter (Signed)
Left her a message to check the kit for a customer service number. Or she may find directions in the kit. She has been ordered a Cologuard test in place of a colonoscopy.

## 2016-05-21 NOTE — Telephone Encounter (Signed)
Called and gave message to MD.

## 2016-05-21 NOTE — Telephone Encounter (Signed)
Called patient regarding high blood sugar readings.  Patient states her sugars are high in the morning, through out the day and at night they are fine.  17th- 274 in the am. 129th- 288 in the am. 20th- 251 in the am.  Today- 284 in the am. > took a shot > went to 165, then dropped to 62 an hour after that > she drunk a pepsi > went to 165 > then checked 30 minutes after that and it was 99 > she then ate.  Patient is worried and does not feel comfortable leaving the house.  Please advise. Thank you!

## 2016-05-21 NOTE — Telephone Encounter (Signed)
Patient called to advise that she has high numbers and needs to speak to nurse about next steps. Please call to advise. Not okay to leave message.

## 2016-05-22 ENCOUNTER — Telehealth: Payer: Self-pay

## 2016-05-22 ENCOUNTER — Telehealth: Payer: Self-pay | Admitting: Gastroenterology

## 2016-05-22 ENCOUNTER — Telehealth: Payer: Self-pay | Admitting: Internal Medicine

## 2016-05-22 NOTE — Telephone Encounter (Signed)
Please ask her to also try to move the entire dose of metformin (2 tablets) with dinner. This will help the am sugars. Let us know how this works in several days.

## 2016-05-22 NOTE — Telephone Encounter (Signed)
Called patient and advised of message below, patient will call us in a few days to let us know how the sugars are doing, and if they do increase during the night I advised of the change we could make with the metformin. Patient understood and will call back.  

## 2016-05-22 NOTE — Telephone Encounter (Signed)
Called patient and advised of message below, patient will call us in a few days to let us know how the sugars are doing, and if they do increase during the night I advised of the change we could make with the metformin. Patient understood and will call back.

## 2016-05-22 NOTE — Telephone Encounter (Signed)
Please advise. I called patient this morning and advised of the message to change lantus dose, but this is later in the day.

## 2016-05-22 NOTE — Telephone Encounter (Signed)
Pt called in and wanted me to tell you that she has developed this irrational fear that she is going to have a stroke or a heart attack due to her sugars being so high in the mornings, she wants to know if there is something she can do to fix this problem with her sugar.

## 2016-05-22 NOTE — Telephone Encounter (Signed)
Spoke with pt and let her know the hemoccult cards only show if blood is present in the stool, they would not show if there was a polyp. Pt understands and states she will do the cologuard test when she gets the labels in the mail.

## 2016-05-22 NOTE — Telephone Encounter (Signed)
Called and spoke with patient about changing the medication, patient understood and had no questions.

## 2016-05-22 NOTE — Telephone Encounter (Signed)
Called and spoke with patient about changing the medication, patient understood and had no questions.   

## 2016-05-31 ENCOUNTER — Telehealth: Payer: Self-pay

## 2016-05-31 ENCOUNTER — Telehealth: Payer: Self-pay | Admitting: Internal Medicine

## 2016-05-31 NOTE — Telephone Encounter (Signed)
Patient called in, stated she was worried due to her blood sugars. She checked it this morning and it was 314, rechecked in 20 minutes and it was 324, she took her small meal insulin which was 9 units and then sliding scale of 5 units. Overall patient took 14 units, she stated she waited 30 minutes and it was beginning to come down to 292, I advised patient to calm down as she was upset. I stated to patient to try to relax and that will help to bring down the sugars as well. I also advised patient to call back if she rechecked them and they were going higher and not lower.  I wanted you to be aware.   

## 2016-05-31 NOTE — Telephone Encounter (Signed)
Patient called to advise that her readings over the last few days have been in the 200's. This morning, it was 309. xferred to [employer] Corporation[employer]ulie thompson

## 2016-05-31 NOTE — Telephone Encounter (Signed)
Patient called in, stated she was worried due to her blood sugars. She checked it this morning and it was 314, rechecked in 20 minutes and it was 324, she took her small meal insulin which was 9 units and then sliding scale of 5 units. Overall patient took 14 units, she stated she waited 30 minutes and it was beginning to come down to 292, I advised patient to calm down as she was upset. I stated to patient to try to relax and that will help to bring down the sugars as well. I also advised patient to call back if she rechecked them and they were going higher and not lower.  I wanted you to be aware.

## 2016-06-04 ENCOUNTER — Telehealth: Payer: Self-pay | Admitting: Gastroenterology

## 2016-06-04 NOTE — Telephone Encounter (Signed)
recommended to patient to hold the linzess for a few days to see if the nausea goes away, if nausea persists then it is probably something other than the linzess. She stated she actually thinks its her nerves making her sick to her stomach. She will call back if needed and she will stop the linzess for a few days

## 2016-06-05 ENCOUNTER — Telehealth (HOSPITAL_COMMUNITY): Payer: Self-pay

## 2016-06-05 ENCOUNTER — Telehealth: Payer: Self-pay

## 2016-06-05 NOTE — Telephone Encounter (Signed)
LVM to call back to discuss sugars.

## 2016-06-05 NOTE — Telephone Encounter (Signed)
Patient called in to advise that her last 9 readings over 5 days have been mid 200's. One outlying [id number]. She is requesting advice.   She advised that she is also under a lot of stress at this time.

## 2016-06-05 NOTE — Telephone Encounter (Signed)
Returned call for [name], who called to discuss sugars.  Thank you, -LL

## 2016-06-05 NOTE — Telephone Encounter (Signed)
LVM to call back to discuss sugars.  

## 2016-06-05 NOTE — Telephone Encounter (Signed)
Patient called and said that she has been going to therapist and has been meditating. She said that she is having a really hard time with her stomach. She has talked to her other providers and they have told her that her stomach issues are related to stress and anxiety. The patient states she does not want any more pills, just a suggestion as to what she can do to get some relief from the nausea and pain. Please review and advise, thank you

## 2016-06-07 ENCOUNTER — Telehealth (HOSPITAL_COMMUNITY): Payer: Self-pay

## 2016-06-07 DIAGNOSIS — F411 Generalized anxiety disorder: Secondary | ICD-10-CM

## 2016-06-07 NOTE — Telephone Encounter (Signed)
Medication management - Telephone call with pt. after she left a message she had an endoscopy done of her stomach and the GI provider informed her she has no medical issues but thinks her continous nausea is due to her anxiety and mental status at times.  Patient reports "I'm an anxious person" and reports the GI provider feels all her GI problems are related to her unmanaged anxiety and informed patient to follow up with her psychiatrist to discuss further.  Informed patient Dr. Lolly MustacheArfeen was out of the office this afternoon but would send the message to him with request he call her to discuss further per patient's request and patient agreed with plan.

## 2016-06-08 NOTE — Telephone Encounter (Signed)
She should see therapist .  She is already taking a lot of medication and I will defer further increasing her psychiatric medication.

## 2016-06-11 ENCOUNTER — Other Ambulatory Visit: Payer: Self-pay

## 2016-06-11 ENCOUNTER — Telehealth: Payer: Self-pay | Admitting: Gastroenterology

## 2016-06-11 LAB — COLOGUARD: Cologuard: NEGATIVE

## 2016-06-11 MED ORDER — BUSPIRONE HCL 5 MG PO TABS: 5.0000 mg | ORAL_TABLET | Freq: Four times a day (QID) | ORAL | 0 refills | Status: DC

## 2016-06-11 NOTE — Telephone Encounter (Signed)
I spoke to patient again this morning and she is seeing a therapist once a week. She states the therapy is going well, but she is constantly sick to her stomach. Patient states that her other doctors are all telling her that her problem is "in her head, and she needs to talk to her psychiatrist." Patient states she will continue to call until something is done to help her stomach and the anxiety.

## 2016-06-11 NOTE — Telephone Encounter (Signed)
Left a message for the patient. IBS does not usually cause nausea.

## 2016-06-11 NOTE — Telephone Encounter (Signed)
I returned patient's phone call.  Recently she's seen GI doctor for persistent nausea and she was told there is nothing wrong and she may have anxiety.  She also started seeing therapist.  She is taking BuSpar twice a day and Valium 3 times a day.  I recommended to increase BuSpar up to 4 times a day to help anxiety.  Patient agreed.  I will call [facility] prescription for 30 day supply to see if BuSpar 5 mg up to 4 times a day help.

## 2016-06-12 ENCOUNTER — Telehealth: Payer: Self-pay

## 2016-06-12 NOTE — Telephone Encounter (Signed)
Called and notified patient of the change in insulin. LVM to call back if any low bs or questions, gave call back number.

## 2016-06-12 NOTE — Telephone Encounter (Signed)
Called and notified patient of the change in insulin. LVM to call back if any low bs or questions, gave call back number.   

## 2016-06-12 NOTE — Telephone Encounter (Signed)
This is the last regimen I have for her: - Lantus 32 units at bedtime - Novolog: 7-9 units depending on the size of your meal - Novolog Sliding scale: - 150-200: + 1 unit 201-250: + 2 units 251-300: + 3 units 301-350: + 4 units >350: + 5 units Please increase the mealtime doses by 2 units if no lows.

## 2016-06-12 NOTE — Telephone Encounter (Signed)
Patient is calling to report her bs jumped from 233 to 336 in an hour. Her bs has been very sporadic and high.  333 around 9:30am this morning.  Please advise.  Thank you,  -LL

## 2016-06-12 NOTE — Telephone Encounter (Signed)
Please advise. Thank you

## 2016-06-13 NOTE — Progress Notes (Unsigned)
Letter to the patient. 

## 2016-06-19 ENCOUNTER — Telehealth (HOSPITAL_COMMUNITY): Payer: Self-pay

## 2016-06-19 ENCOUNTER — Telehealth: Payer: Self-pay | Admitting: Internal Medicine

## 2016-06-19 NOTE — Telephone Encounter (Signed)
Noted  

## 2016-06-19 NOTE — Telephone Encounter (Signed)
Patient calling to inform you that: Over the last 36 times she has checked her sugars, it has only been over 200 12 times. Did not ask for return call, just wanted to inform you.

## 2016-06-19 NOTE — Telephone Encounter (Signed)
Medication management - message left for patient her message was received stating she has noticed a new symptom, that she cannot sit still.  States she noticed she had gotten up 15 times during a 1 hour show the previous night and just cannot sit without getting up and seeming restless.  Informed patient this was not a typical side effect of the current medication she is taking from us unless its some serotonin agitation or possibly neuropathy.  Requested she call back with more information but agreed to send concern to Dr. Lolly MustacheArfeen for more feedback.

## 2016-06-20 ENCOUNTER — Telehealth: Payer: Self-pay | Admitting: Internal Medicine

## 2016-06-20 ENCOUNTER — Telehealth: Payer: Self-pay

## 2016-06-20 NOTE — Telephone Encounter (Signed)
Called patient and submitted questions to MD/   

## 2016-06-20 NOTE — Telephone Encounter (Signed)
She has are seen a gastroenterologist and she needs to talk to her

## 2016-06-20 NOTE — Telephone Encounter (Signed)
Patient has been sick on stomach in the mornings and can not eat. Patient states that she checks her sugar and it will be normal around 100 however, from 7am-10am it can fluctuate between 100-200. Patient needs advise on what to do. Call patient to advise.

## 2016-06-20 NOTE — Telephone Encounter (Signed)
Called and notified of Dr.Kumar's note. Patient understood, had no questions at this time.

## 2016-06-20 NOTE — Telephone Encounter (Signed)
Called patient to advise of the problems she is having, patient states that she has a upset stomach in the morning, until around lunch time and it goes away. Her sugars are jumping all around, it was 110 when she woke up this morning, and she took her insulins and ate breakfast and it jumped up to 220. Patient states the past two mornings she has woke up in the morning with her sugars being 220. Any advice on how to control the upset stomach in the morning. Thank you!

## 2016-06-20 NOTE — Telephone Encounter (Signed)
Called and notified of Dr.Kumar's note. Patient understood, had no questions at this time.   

## 2016-06-21 NOTE — Telephone Encounter (Signed)
I called patient and let her know that Dr. Lolly MustacheArfeen said there is nothing he can do as far as medications, he recommends she continue counciling and taking her medications. He suggested she follow up with her endocrinologist about possible restless leg syndrome.

## 2016-06-27 ENCOUNTER — Telehealth: Payer: Self-pay

## 2016-06-27 NOTE — Telephone Encounter (Signed)
Patient called in, stated that she lost her insulin sliding scale paperwork, I went over recent changes, patient understood and had no questions at this time.

## 2016-06-28 ENCOUNTER — Telehealth (HOSPITAL_COMMUNITY): Payer: Self-pay

## 2016-06-28 NOTE — Telephone Encounter (Signed)
Patient is calling because she is driving herself and her husband crazy. Patient states that she is having ruminating thoughts, anxiety and is quick to anger. She said her thoughts seem disconnected as well, she gave the example of sitting at the table eating and then getting mad because her CD player did not cut on after she plugged it in. Patient says one thing is not connected to the other and she is not sure why she was so mad about it. Patient also states that the different health problems she is having is causing anxiety and an upset stomach, patient said all of her other providers think that the upset stomach is coming from her psychiatric illness. Please review and advise, thank you

## 2016-06-28 NOTE — Telephone Encounter (Signed)
Looks like she is seeing Dr. Lolly MustacheArfeen in about 2-3 weeks. I'd suggest she call to get a follow-up visit with him sooner if there are any cancellations.

## 2016-07-17 ENCOUNTER — Ambulatory Visit (HOSPITAL_COMMUNITY): Payer: Self-pay | Admitting: Psychiatry

## 2016-07-26 ENCOUNTER — Ambulatory Visit (HOSPITAL_COMMUNITY): Payer: Self-pay | Admitting: Psychiatry

## 2016-07-27 ENCOUNTER — Encounter (HOSPITAL_COMMUNITY): Payer: Self-pay | Admitting: Psychiatry

## 2016-07-27 ENCOUNTER — Ambulatory Visit (INDEPENDENT_AMBULATORY_CARE_PROVIDER_SITE_OTHER): Admitting: Psychiatry

## 2016-07-27 DIAGNOSIS — R634 Abnormal weight loss: Secondary | ICD-10-CM

## 2016-07-27 DIAGNOSIS — Z818 Family history of other mental and behavioral disorders: Secondary | ICD-10-CM | POA: Diagnosis not present

## 2016-07-27 DIAGNOSIS — M549 Dorsalgia, unspecified: Secondary | ICD-10-CM

## 2016-07-27 DIAGNOSIS — F411 Generalized anxiety disorder: Secondary | ICD-10-CM

## 2016-07-27 DIAGNOSIS — G4700 Insomnia, unspecified: Secondary | ICD-10-CM | POA: Diagnosis not present

## 2016-07-27 DIAGNOSIS — F3131 Bipolar disorder, current episode depressed, mild: Secondary | ICD-10-CM

## 2016-07-27 MED ORDER — FLUOXETINE HCL 40 MG PO CAPS: 40.0000 mg | ORAL_CAPSULE | Freq: Every day | ORAL | 0 refills | Status: DC

## 2016-07-27 MED ORDER — LAMOTRIGINE 200 MG PO TABS: 200.0000 mg | ORAL_TABLET | Freq: Every day | ORAL | 0 refills | Status: DC

## 2016-07-27 MED ORDER — BUSPIRONE HCL 5 MG PO TABS: 5.0000 mg | ORAL_TABLET | Freq: Four times a day (QID) | ORAL | 0 refills | Status: DC

## 2016-07-27 MED ORDER — DIAZEPAM 5 MG PO TABS: ORAL_TABLET | ORAL | 0 refills | Status: DC

## 2016-07-27 MED ORDER — ARIPIPRAZOLE 2 MG PO TABS: 2.0000 mg | ORAL_TABLET | Freq: Every day | ORAL | 0 refills | Status: DC

## 2016-07-27 NOTE — Progress Notes (Signed)
BH MD/PA/NP OP Progress Note  07/27/2016 8:51 AM [name]  MRN:  [id number]  Chief Complaint:  Chief Complaint    Follow-up     Subjective:  I have a lot of family issues.  I think my Facebook account his [place].  I'm very nervous.  HPI: [name] came for her follow-up appointment.  She remains very emotional, labile and easily tearful.  She has lot of issues and recent issue is that her Facebook account is hacked.  She believes somebody sporting nude pictures on her account.  She remains anxious and continues to have panic attacks and issues with her husband.  She also endorse family issues because sister and daughter not talking.  She admitted nausea and persistent lack of energy and depression.  She lost 10 pounds since not eating well.  Now she is taking Zofran for nausea.  She is seeing therapist page Luan Pulling regularly.  She admitted some nights crying spells, poor sleep, racing thoughts but denies any hallucination, paranoia.  Patient has multiple somatic complaints including back pain, arthritis, nausea.  She endorse these somatic complaints sometime gets very intense.  Patient called office a few times requesting to increase the medication.  She endorse taking Valium twice a day and finally BuSpar 4 tablets a day.  Patient continues to struggle with blood sugar but lately she believed it is better.  She has no tremors or any concerns from the medication.  She is not agitated but remains very emotional, labile, easily tearful and easily upset.  Her appetite is fair.  Her energy level is okay.  Patient denies drinking alcohol or using any illegal substances.  She lives with her husband but believe he is not supportive.  He does not allow her to go anywhere and she has not seen her family members in a long time who lives in [place].  Visit Diagnosis:    ICD-10-CM   1. Bipolar affective disorder, currently depressed, mild (HCC) F31.31 lamoTRIgine (LAMICTAL) 200 MG tablet    FLUoxetine (PROZAC)  40 MG capsule    diazepam (VALIUM) 5 MG tablet    ARIPiprazole (ABILIFY) 2 MG tablet  2. Generalized anxiety disorder F41.1 diazepam (VALIUM) 5 MG tablet    busPIRone (BUSPAR) 5 MG tablet    Past Psychiatric History: Reviewed. Patient is started seeing psychiatrist Elna Breslow at age 61 because of poor impulse control, mania, irritability and mood swing. Then she see Dr. Tomasa Rand for more than 15 years. In the past she had tried lithium but she developed lithium toxicity. She also had tried Seroquel, Abilify, Depakote, Lexapro, Zoloft, Paxil, Ativan and Risperdal. We also tried trazodone and Vistaril but she did not like the side effects. Patient endorse history of mania and severe mood swings. She has one psychiatric hospitalization due to mania. Patient denies any history of suicidal attempt.  Past Medical History:  Past Medical History:  Diagnosis Date  . Anxiety   . Bipolar 1 disorder (HCC)   . Breast density 06/25/2012   Right breast density, will get mammogram and [place]  . Depression   . Diabetes mellitus without complication (HCC)   . Duodenitis   . HTN (hypertension)   . Hyperlipidemia   . IBS (irritable bowel syndrome)     Past Surgical History:  Procedure Laterality Date  . ABDOMINAL HYSTERECTOMY    . CHOLECYSTECTOMY    . COLONOSCOPY      Family Psychiatric History: Reviewed.  Family History:  Family History  Problem Relation Age of  Onset  . Hypertension Mother   . Bipolar disorder Mother   . Cancer Mother   . Heart disease Brother   . Thyroid disease Sister   . Hypertension Sister   . Bipolar disorder Sister   . Depression Father   . Cancer Father   . Bipolar disorder Daughter   . Bipolar disorder Maternal Aunt   . Colon cancer Neg Hx   . Colon polyps Neg Hx   . Esophageal cancer Neg Hx   . Kidney disease Neg Hx   . Gallbladder disease Neg Hx   . Diabetes Neg Hx   . Dementia Neg Hx     Social History:  Social History   Social History  .  Marital status: Married    Spouse name: N/A  . Number of children: 1  . Years of education: 3312   Occupational History  . Disabled    Social History Main Topics  . Smoking status: Never Smoker  . Smokeless tobacco: Never Used  . Alcohol use No  . Drug use: No  . Sexual activity: Not Currently   Other Topics Concern  . None   Social History Narrative   Lives at home w/ her husband   Left-handed   Caffeine: 1-2 cups of coffee daily    Allergies:  Allergies  Allergen Reactions  . Reglan [Metoclopramide] Other (See Comments)    Chest pains    Metabolic Disorder Labs: Lab Results  Component Value Date   HGBA1C 4.6 07/16/2013   MPG 85 07/16/2013   MPG 91 04/15/2012   No results found for: PROLACTIN Lab Results  Component Value Date   CHOL 206 (H) 08/24/2015   TRIG 109.0 08/24/2015   HDL 51.60 08/24/2015   CHOLHDL 4 08/24/2015   VLDL 21.8 08/24/2015   LDLCALC 133 (H) 08/24/2015   LDLCALC 98 09/01/2014     Current Medications: Current Outpatient Prescriptions  Medication Sig Dispense Refill  . AMBULATORY NON FORMULARY MEDICATION Squatty Potty x 1 1 each 0  . amLODipine (NORVASC) 5 MG tablet Take 5 mg by mouth daily.    . BD PEN NEEDLE NANO U/F 32G X 4 MM MISC Use with insulins. 100 each 5  . BENICAR 40 MG tablet     . busPIRone (BUSPAR) 5 MG tablet Take 1 tablet (5 mg total) by mouth 4 (four) times daily. 120 tablet 0  . diazepam (VALIUM) 5 MG tablet Take 1 tablet (5 mg total) by mouth 3 (three) times daily. 270 tablet 0  . FLUoxetine (PROZAC) 40 MG capsule Take 1 capsule (40 mg total) by mouth daily. 90 capsule 0  . gabapentin (NEURONTIN) 800 MG tablet Take 800 mg by mouth. 1 tablet in the morning and 2 tablets at bedtime.    Marland Kitchen. glucose blood (FREESTYLE LITE) test strip Use to check blood sugar 6 times per day. Dx Code E11.9 600 each 2  . insulin aspart (NOVOLOG FLEXPEN) 100 UNIT/ML FlexPen Inject up to 20 units a day, also including sliding scale. 15 pen 3  .  Insulin Glargine (LANTUS SOLOSTAR) 100 UNIT/ML Solostar Pen Inject 32 units daily including sliding scale 15 pen 3  . Insulin Syringes, Disposable, U-100 1 ML MISC To use for insulin injection.. 100 each 1  . lamoTRIgine (LAMICTAL) 200 MG tablet Take 1 tablet (200 mg total) by mouth daily. 90 tablet 0  . Lancets (FREESTYLE) lancets Use to test blood sugar 4 to 6 times daily as instructed. 600 each 1  . linaclotide (  LINZESS) 290 MCG CAPS capsule Take 1 capsule (290 mcg total) by mouth daily before breakfast. 90 capsule 4  . metFORMIN (GLUCOPHAGE) 500 MG tablet TAKE 1 TABLET TWICE A DAY WITH MEALS 180 tablet 11  . omeprazole (PRILOSEC) 40 MG capsule Take 1 capsule (40 mg total) by mouth 2 (two) times daily. 180 capsule 3  . ondansetron (ZOFRAN ODT) 4 MG disintegrating tablet Take 1 tablet (4 mg total) by mouth 3 (three) times daily with meals. 90 tablet 4  . NUCYNTA ER 200 MG TB12      No current facility-administered medications for this visit.     Neurologic: Headache: No Seizure: No Paresthesias: Yes  Musculoskeletal: Strength & Muscle Tone: within normal limits Gait & Station: normal Patient leans: N/A  Psychiatric Specialty Exam: Review of Systems  Constitutional: Positive for weight loss.  HENT: Negative.   Respiratory: Negative.   Cardiovascular: Negative.   Gastrointestinal: Positive for nausea.  Musculoskeletal: Positive for back pain.  Skin: Negative.  Negative for itching and rash.  Neurological: Positive for tingling.  Psychiatric/Behavioral: The patient is nervous/anxious and has insomnia.     Blood pressure 140/78, pulse 100, height 5' 4.5" (1.638 m), weight 158 lb 6.4 oz (71.8 kg).Body mass index is 26.77 kg/m.  General Appearance: Casual and Easily tearful  Eye Contact:  Fair  Speech:  Slow and At times rambling  Volume:  Normal  Mood:  Anxious and Tearful  Affect:  Labile  Thought Process:  Descriptions of Associations: Circumstantial  Orientation:  Full  (Time, Place, and Person)  Thought Content: Rumination   Suicidal Thoughts:  No  Homicidal Thoughts:  No  Memory:  Immediate;   Fair Recent;   Fair Remote;   Fair  Judgement:  Fair  Insight:  Good  Psychomotor Activity:  Slightly increase  Concentration:  Concentration: Fair and Attention Span: Fair  Recall:  [employer]Fair  Fund of Knowledge: Good  Language: Good  Akathisia:  No  Handed:  Right  AIMS (if indicated):  0  Assets:  Communication Skills Desire for Improvement Housing  ADL's:  Intact  Cognition: WNL  Sleep:  Okay     Assessment: Bipolar disorder type I.  Anxiety disorder NOS  Plan: Patient continues to have mood lability and continues to have family and social issues.  She is seeing therapist page Luan PullingDunlap regularly.  She still have anxiety and mood lability.  I recommended to try low-dose Abilify.  She tried Abilify in the past but do not remember the response.  She is taking Valium 5 mg twice a day, BuSpar 5 mg 4 times a day, Prozac 40 mg day and Lamictal 200 mg daily.  Discussed polypharmacy.  However patient feels the current medicine is not working and she is willing to try low-dose Abilify.  Patient has no rash, itching, tremors or shakes.  Reassurance given.  I commended to delete Facebook account.  Encourage to watch her calorie intake and blood sugar test regularly.  We will get records from the primary care physician including recent blood work results.  Follow-up in 3 months.  Discuss safety concern that anytime having active suicidal thoughts or homicidal thoughts then she need to call 911 or go to local emergency room.  Time spent 25 minutes.  More than 50% of the time spent in psychoeducation, counseling and coordination of care.  Sabrina Yeske T., MD 07/27/2016, 8:51 AM

## 2016-07-30 ENCOUNTER — Ambulatory Visit: Admitting: Nurse Practitioner

## 2016-08-01 ENCOUNTER — Telehealth: Payer: Self-pay

## 2016-08-01 NOTE — Telephone Encounter (Signed)
Please confirm that she is tending to have low blood sugars after breakfast mostly, also confirm how much she takes the NovoLog at breakfast She should probably take about 5 units less Novolog in the morning at breakfast than what her instructions are

## 2016-08-01 NOTE — Telephone Encounter (Signed)
Patient called in states that she got up this morning, ate her breakfast sugar was 168, took her insulin according to the scale. She took her dogs to the vet and on the way home driving she began to fill sleepy. She pulled over, and waited it out and then drove home. When she got home she checked her blood sugar it was 32. She drunk orange juice and ate a piece of candy. She sat down on the couch and within 5 minutes she passed out on the couch. She woke up 30 minutes later and felt confused, she ate some food for lunch, and waited. She just recently checked her sugar and it was now 206. She does not know what to do as this is not the first time this as happened. Can you please advise while Dr.Gherghe is out.   Thank you!

## 2016-08-01 NOTE — Telephone Encounter (Signed)
Called and LVM advising patient to call back regarding how much insulin she takes and if the sugars are mostly low in the morning. Also advised of what we could do in case she doesn't get back with me before 5.

## 2016-08-01 NOTE — Telephone Encounter (Signed)
Called and LVM advising patient to call back regarding how much insulin she takes and if the sugars are mostly low in the morning. Also advised of what we could do in case she doesn't get back with me before 5.  

## 2016-08-02 ENCOUNTER — Telehealth: Payer: Self-pay

## 2016-08-02 NOTE — Telephone Encounter (Signed)
Attempted to contact patient again, no answer. Will try again.   

## 2016-08-02 NOTE — Telephone Encounter (Signed)
Attempted to contact patient again, no answer. Will try again.

## 2016-08-03 ENCOUNTER — Telehealth: Payer: Self-pay | Admitting: Gastroenterology

## 2016-08-03 NOTE — Telephone Encounter (Signed)
Left message to call back  

## 2016-08-06 ENCOUNTER — Ambulatory Visit: Admitting: Internal Medicine

## 2016-08-06 NOTE — Telephone Encounter (Signed)
Patient coming in today for appointment 

## 2016-08-07 ENCOUNTER — Ambulatory Visit (INDEPENDENT_AMBULATORY_CARE_PROVIDER_SITE_OTHER): Admitting: Internal Medicine

## 2016-08-07 ENCOUNTER — Encounter: Payer: Self-pay | Admitting: Internal Medicine

## 2016-08-07 VITALS — BP 132/80 | HR 91 | Wt 160.0 lb

## 2016-08-07 DIAGNOSIS — E139 Other specified diabetes mellitus without complications: Secondary | ICD-10-CM

## 2016-08-07 DIAGNOSIS — E109 Type 1 diabetes mellitus without complications: Secondary | ICD-10-CM

## 2016-08-07 MED ORDER — INSULIN GLARGINE 100 UNIT/ML SOLOSTAR PEN: PEN_INJECTOR | SUBCUTANEOUS | 11 refills | Status: DC

## 2016-08-07 MED ORDER — GLUCAGON (RDNA) 1 MG IJ KIT: 1.0000 mg | PACK | Freq: Once | INTRAMUSCULAR | 12 refills | Status: DC | PRN

## 2016-08-07 MED ORDER — GLUCOSE BLOOD VI STRP: ORAL_STRIP | 2 refills | Status: DC

## 2016-08-07 NOTE — Patient Instructions (Addendum)
Please decrease: - Lantus to 28 units at bedtime  Please change:  - NovoLog: 6-7 units before b'fast - NovoLog: 7-9 units before lunch and dinner  Please continue: - Novolog Sliding scale: 150-200: + 1 unit 201-250: + 2 units 251-300: + 3 units 301-350: + 4 units >350: + 5 units - Metformin 500 mg x2 at night  Please return in 3 months with your sugar log.

## 2016-08-07 NOTE — Progress Notes (Addendum)
Patient ID: [name], female   DOB: 10-02-1955, 61 y.o.   MRN: [id number]  HPI: [name] is a 61 y.o.-year-old female, initially referred by her PCP, Dr. Assunta Found (PA: Lenise Herald), returning for f/u for LADA, dx 2005, insulin-dependent since ~2006, controlled, with complications (PN, gastroparesis?). Last visit 3.5 mo ago.  She had 2 debilitating panic attacks, 1 in her psychiatrist office >> meds were changed. She is now on Abilify, Buspar. Feels better!  Reviewed hx: She was on an Omnipod insulin pump on 08/01/2015. She had severe anxiety and depression and got so overwhelmed with the insulin pump that we had to take her off the pump.   We switched her to a basal-bolus insulin regimen, but she was unable to calculate the insulin doses based on insulin to carb ratio, so now she is using fixed rapid acting insulin doses.  Previous fructosamine levels: 05/31/2016: HbA1c calculated from fructosamine is better, at 7.25% 12/15/2015: HbA1c calculated from the fructosamine is higher, 8.4%. 08/24/2015:  HbA1c calculated from fructosamine is 7.4%. 05/24/2015: HbA1c calculated from fructosamine is 6.9%. ... Lab Results  Component Value Date   HGBA1C 4.6 07/16/2013   HGBA1C sent to [facility] 07/16/2013   HGBA1C 4.8 04/15/2012  - 02/12/2013: 4.2% - 12/19/2012: 5.2% - 09/18/2012: 6.5%  She is now on: - Lantus 32 units at bedtime - Novolog: 7-8-9 units depending on the size of the meal - Novolog Sliding scale: 150-200: + 1 unit 201-250: + 2 units 251-300: + 3 units 301-350: + 4 units >350: + 5 units  - Metformin 1000 mg at night   Pt checks her sugars 4x a day  - per detailed log - fluctuating - am:  116-202, 229 >> 107-195, 202 >> 97-324 >> 150-190 >> 125-190, 232 >> 62, 131-222 - 2h after b'fast: 80-190 >> 92-236 >> 128-146 >> n/c >> 215 >> 99, 345 - before lunch:89-166 >> 48, 71-154, 194 >> 90-373 >> 203 >> 51, 55-215 >> 32-137, 182 - 2h after lunch: 151-196 >> n/c  >>84-162 >> 67-HI >> n/c >> 46, 58 >> 83 - before dinner:  62-252, 297 >> 121-311 >> cannot remember >> 40, 60, 74-132 >> 66, 70-233 - 2h after dinner: 107-277 >> 92-212 >> 211-365 >> 162 >> n/c - bedtime: 68, 99-231 >> 119-162 >> see above >> 140-180 >> 47, 75-187, 249 >> 38, 49-165, 222 - at night: 141-185 >> 187 >> 111-169, 280 >> 179-320 >> 116 >> 82, 250 >> 90 Has lows. Lowest sugar was 33 (when she was sick at not eat) >> 40 >> 32; she has hypoglycemia awareness at 50. Highest sugar was 300s >> 233  Pt's meals are: - Breakfast: toast, bowl of cereal + 2% milk (2x a week) or oatmeal, occas. eggs - Lunch: soup, sometimes sandwich, vegetables - Dinner: chicken/pork/beef + vegetable, no starch - Snacks: sugar free sweets  - No CKD, last BUN/creatinine:  Lab Results  Component Value Date   BUN 15 08/24/2015   CREATININE 0.59 08/24/2015   - Lipids: Lab Results  Component Value Date   CHOL 206 (H) 08/24/2015   HDL 51.60 08/24/2015   LDLCALC 133 (H) 08/24/2015   TRIG 109.0 08/24/2015   CHOLHDL 4 08/24/2015  Not on a statin.   - last eye exam was in [place] in 12/2014 >> No DR. + small cataract.  - she has numbness and tingling in her feet. On Neurontin. She is seeing a podiatrist [facility]). She had 2  toenails removed due to fungal inf.  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 Also: GERD, HTN, HL, Fibromyalgia >> on Gabapentin.  Last TSH: Lab Results  Component Value Date   TSH 0.81 08/24/2015   ROS: Constitutional: + weight loss, + fatigue, no subjective hyperthermia, + subjective hypothermia Eyes: no blurry vision, no xerophthalmia ENT: no sore throat, no nodules palpated in throat, + dysphagia, no odynophagia, no hoarseness Cardiovascular: no CP/no SOB/+ palpitations/no leg swelling Respiratory: no cough/no SOB/+ wheezing Gastrointestinal: + N/+ V/resolved D on Linzess/no C/+ acid  reflux Musculoskeletal: no muscle aches/no joint aches Skin: no rashes, + hair loss Neurological: no tremors/no tingling/no dizziness, + HA  I reviewed pt's medications, allergies, PMH, social hx, family hx, and changes were documented in the history of present illness. Otherwise, unchanged from my initial visit note.  PE: BP 132/80 (BP Location: Left Arm, Patient Position: Sitting)   Pulse 91   Wt 160 lb (72.6 kg)   SpO2 97%   BMI 27.04 kg/m    Wt Readings from Last 3 Encounters:  08/07/16 160 lb (72.6 kg)  05/11/16 166 lb (75.3 kg)  04/20/16 171 lb 6.4 oz (77.7 kg)   Constitutional: overweight, in NAD Eyes: PERRLA, EOMI, no exophthalmos ENT: moist mucous membranes, no thyromegaly, no cervical lymphadenopathy Cardiovascular: tachycardia, RR, No MRG Respiratory: CTA B Gastrointestinal: abdomen soft, NT, ND, BS+ Musculoskeletal: no deformities, strength intact in all 4 Skin: moist, warm, no rashes Neurological: no tremor with outstretched hands, DTR normal in all 4  ASSESSMENT: 1. LADA, insulin-dependent, uncontrolled, with complications - peripheral neuropathy - gastroparesis?  Component     Latest Ref Rng 07/16/2013  C-Peptide     0.80 - 3.90 ng/mL 1.29  Glucose     70 - 99 mg/dL 161183 (H)  Glutamic Acid Decarb Ab     <=1.0 U/mL 11.3 (H)  Pancreatic Islet Cell Antibody     <5 JDF Units 5 (A)  + anti-pancreatic antibodies >> LADA rather than type 2 DM. She still has a positive C peptide >> still has insulin secretion. For this reason, we continued the Metformin.  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 Complex patient, with LADA, complicated by severe anxiety and depression. She is on Basal-bolus insulin regimen and also metformin, with still fluctuating sugars. She has more lows recently, in the 30s and 40s. These mostly happen around lunch time, so we will reduce the insulin with breakfast. As she may occasionally have a lower sugar later in the day, before meals, will also reduce  the dose of Lantus. I suspect that her insulin sensitivity has increased as she lost 11 pounds since last visit.  - We need to keep her insulin regimen as simple as possible, since she develops severe anxiety regarding calculating her insulin doses in the past - She does a great job with her sugar log which we reviewed together -  I suggested to:  Patient Instructions  Please decrease: - Lantus to 28 units at bedtime  Please change:  - NovoLog: 6-7 units before b'fast - NovoLog: 7-9 units before lunch and dinner  Please continue: - Novolog Sliding scale: 150-200: + 1 unit 201-250: + 2 units 251-300: + 3 units 301-350: + 4 units >350: + 5 units - Metformin 500 mg x2 at night  Please return in 3 months with your sugar log.    - today will check a fructosamine -  continue checking sugars at different times of the day - check 4x a day, rotating checks - advised for yearly eye exams >> she needs one  >>  Scheduled  - she had labs with PCP this year >> will request records - Return to clinic in 3 mo with sugar log   2. 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 Fructosamine 08/07/2016 346* 190 - 270 umol/L Final   HbA1c  Calculated from the fructosamine is 7.5%, only slightly higher than before.  Carlus Pavlov, MD PhD [facility]

## 2016-08-08 ENCOUNTER — Telehealth (HOSPITAL_COMMUNITY): Payer: Self-pay

## 2016-08-08 ENCOUNTER — Telehealth: Payer: Self-pay | Admitting: Internal Medicine
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
## 2016-08-08 NOTE — Telephone Encounter (Signed)
If she believe Abilify causing then she should stop the Abilify immediately.  No new medication for now.

## 2016-08-09 ENCOUNTER — Telehealth: Payer: Self-pay

## 2016-08-09 LAB — FRUCTOSAMINE: Fructosamine: 346 umol/L — ABNORMAL HIGH (ref 190–270)

## 2016-08-09 NOTE — Telephone Encounter (Signed)
Called and LVM advising patient of MD note. Notified patient AVS was sent in the mail. Gave call back number if any questions.

## 2016-08-09 NOTE — Telephone Encounter (Signed)
Please advise 

## 2016-08-09 NOTE — Telephone Encounter (Signed)
Sorry to hear that! No, she should be the one to contact them.

## 2016-08-09 NOTE — Telephone Encounter (Signed)
I called patient and left a voicemail letting her know to stop the Abilify and to call me with any questions

## 2016-08-09 NOTE — Telephone Encounter (Signed)
Called and LVM advising patient of MD note. Notified patient AVS was sent in the mail. Gave call back number if any questions. 

## 2016-08-13 ENCOUNTER — Telehealth: Payer: Self-pay

## 2016-08-13 NOTE — Telephone Encounter (Signed)
Called patient and gave lab results. Patient had no questions or concerns.  

## 2016-08-13 NOTE — Telephone Encounter (Signed)
-----   Message from Carlus Pavlovristina Gherghe, MD sent at 08/09/2016  6:08 PM EDT ----- Sabrina FanningJulie, can you please call pt:  HbA1c  Calculated from the fructosamine is 7.5%, only slightly higher than before.

## 2016-08-16 ENCOUNTER — Telehealth: Payer: Self-pay

## 2016-08-16 ENCOUNTER — Telehealth (HOSPITAL_COMMUNITY): Payer: Self-pay

## 2016-08-16 NOTE — Telephone Encounter (Signed)
Called patient and advised of sugar dropping. Spoke with MD who changed Lantus dose to 24 units, and advised to stay with lower dose of Novolog. Patient understood and had no questions and will call back in a few days.
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
## 2016-08-17 ENCOUNTER — Other Ambulatory Visit (HOSPITAL_COMMUNITY): Payer: Self-pay | Admitting: Psychiatry

## 2016-08-17 NOTE — Telephone Encounter (Signed)
I called her back and left a message as no one picked up the phone.  She can try increasing BuSpar 10 mg 3 times a day.

## 2016-08-20 ENCOUNTER — Other Ambulatory Visit (HOSPITAL_COMMUNITY): Payer: Self-pay

## 2016-08-20 DIAGNOSIS — F411 Generalized anxiety disorder: Secondary | ICD-10-CM

## 2016-08-20 MED ORDER — BUSPIRONE HCL 5 MG PO TABS: 5.0000 mg | ORAL_TABLET | Freq: Four times a day (QID) | ORAL | 1 refills | Status: DC

## 2016-08-21 ENCOUNTER — Other Ambulatory Visit (HOSPITAL_COMMUNITY): Payer: Self-pay

## 2016-08-21 DIAGNOSIS — F411 Generalized anxiety disorder: Secondary | ICD-10-CM

## 2016-08-21 MED ORDER — BUSPIRONE HCL 5 MG PO TABS: 5.0000 mg | ORAL_TABLET | Freq: Four times a day (QID) | ORAL | 1 refills | Status: DC

## 2016-08-30 ENCOUNTER — Telehealth (HOSPITAL_COMMUNITY): Payer: Self-pay

## 2016-08-30 NOTE — Telephone Encounter (Signed)
Patient called and said that she fell backwards out of the shower this morning. She is okay and has put ice on the bump, she does not have any dizziness, nausea or blurry vision. She just felt bad and needed to talk to someone. I spoke with her for about 5 minutes and the conversation ended on a positive note. Patient will call me back with any other questions or concerns.

## 2016-09-12 ENCOUNTER — Ambulatory Visit (HOSPITAL_COMMUNITY)

## 2016-09-12 ENCOUNTER — Other Ambulatory Visit (HOSPITAL_COMMUNITY): Payer: Self-pay | Admitting: Family Medicine

## 2016-09-12 DIAGNOSIS — G44309 Post-traumatic headache, unspecified, not intractable: Secondary | ICD-10-CM

## 2016-09-12 DIAGNOSIS — R4020 Unspecified coma: Secondary | ICD-10-CM

## 2016-09-12 DIAGNOSIS — R2681 Unsteadiness on feet: Secondary | ICD-10-CM

## 2016-09-18 ENCOUNTER — Other Ambulatory Visit: Payer: Self-pay | Admitting: Internal Medicine

## 2016-10-02 ENCOUNTER — Other Ambulatory Visit (HOSPITAL_COMMUNITY): Payer: Self-pay | Admitting: Psychiatry

## 2016-10-02 ENCOUNTER — Telehealth (HOSPITAL_COMMUNITY): Payer: Self-pay

## 2016-10-02 DIAGNOSIS — F411 Generalized anxiety disorder: Secondary | ICD-10-CM

## 2016-10-02 DIAGNOSIS — F3131 Bipolar disorder, current episode depressed, mild: Secondary | ICD-10-CM

## 2016-10-02 NOTE — Telephone Encounter (Signed)
She is on Prozac, BuSpar, Valium, Abilify and Lamictal.  She is taking multiple medication.  If she wants a second opinion we can refer her out.

## 2016-10-02 NOTE — Telephone Encounter (Signed)
Patient has called twice today, she is complaining of stomach pain and severe depression. Patient states she has spent days in her bed waiting to die. She has seen multiple doctors about her stomach pain and they have all told her it is psychological. Patient does not feel that you listen to her and she would like to know what she should do.

## 2016-10-03 ENCOUNTER — Ambulatory Visit (INDEPENDENT_AMBULATORY_CARE_PROVIDER_SITE_OTHER): Admitting: Physician Assistant

## 2016-10-03 ENCOUNTER — Encounter: Payer: Self-pay | Admitting: Physician Assistant

## 2016-10-03 VITALS — BP 144/72 | HR 84 | Ht 64.0 in | Wt 163.0 lb

## 2016-10-03 DIAGNOSIS — R110 Nausea: Secondary | ICD-10-CM | POA: Diagnosis not present

## 2016-10-03 DIAGNOSIS — R109 Unspecified abdominal pain: Secondary | ICD-10-CM

## 2016-10-03 MED ORDER — HYOSCYAMINE SULFATE 0.125 MG SL SUBL: 0.1250 mg | SUBLINGUAL_TABLET | SUBLINGUAL | 1 refills | Status: DC | PRN

## 2016-10-03 MED ORDER — FLUOXETINE HCL 40 MG PO CAPS: 40.0000 mg | ORAL_CAPSULE | Freq: Every day | ORAL | 0 refills | Status: DC

## 2016-10-03 MED ORDER — LAMOTRIGINE 200 MG PO TABS: 200.0000 mg | ORAL_TABLET | Freq: Every day | ORAL | 0 refills | Status: DC

## 2016-10-03 NOTE — Patient Instructions (Signed)
We have sent the following medications to your pharmacy for you to pick up at your convenience:  Hyoscyamine 0.125 mg every 4-6 hours as needed for pain and cramping  Please start Align daily probiotic.

## 2016-10-03 NOTE — Telephone Encounter (Signed)
I spoke with Dr. Donell Beers and he said if you were okay with it, he would be willing to see the patient. Please let me know, thank you

## 2016-10-03 NOTE — Progress Notes (Signed)
Chief Complaint: Nausea, abdominal cramping  HPI:  [name] is a 61 year old Caucasian female with a past medical history of chronic depression, anxiety disorder, fibromyalgia, GERD, irritable bowel syndrome with predominant constipation and others listed below, who presents to clinic today with a complaint of nausea and abdominal cramping.    Please recall patient previously followed with Dr. Juanda Chance and has been following with Dr. Lavon Paganini recently. She was last seen 05/11/16 for continued constipation. Patient did have an upper endoscopy 02/26/14 with Dr. Juanda Chance for similar symptoms of nausea and retching. This revealed moderately severe duodenitis and was otherwise normal. Biopsies revealed inflammation.     Today, the patient tells me that she has been using Omeprazole 40 mg twice a day but about 2 weeks ago she used antibiotics for a sinus infection and became "sick to my stomach". She tells me that she had a "flare of my fibromyalgia" and describes a "spasming of my intestines and throat". Patient tells me that she has been using her Zofran on a daily basis which does help with her nausea and symptoms are slightly better but patient still experiences "the muscles in my throat get tight and then I get sick". Patient does admit that she has high anxiety, but is suffering from nausea on a daily basis at this point which she relates to "spasms". Patient also relates her bowel movements are slightly changed after use of "very strong antibiotics".    Patient denies fever, chills, vomiting, blood in her stool, melena, weight loss, fatigue, anorexia, change in bowel habits or reflux.  Past Medical History:  Diagnosis Date  . Anxiety   . Bipolar 1 disorder (HCC)   . Breast density 06/25/2012   Right breast density, will get mammogram and [place]  . Depression   . Diabetes mellitus without complication (HCC)   . Duodenitis   . HTN (hypertension)   . Hyperlipidemia   . IBS (irritable bowel syndrome)      Past Surgical History:  Procedure Laterality Date  . ABDOMINAL HYSTERECTOMY    . CHOLECYSTECTOMY    . COLONOSCOPY      Current Outpatient Prescriptions  Medication Sig Dispense Refill  . AMBULATORY NON FORMULARY MEDICATION Squatty Potty x 1 1 each 0  . amLODipine (NORVASC) 5 MG tablet Take 5 mg by mouth daily.    . BD PEN NEEDLE NANO U/F 32G X 4 MM MISC Use with insulins. 100 each 5  . BENICAR 40 MG tablet     . diazepam (VALIUM) 5 MG tablet Take twice daily as needed 180 tablet 0  . FLUoxetine (PROZAC) 40 MG capsule Take 1 capsule (40 mg total) by mouth daily. 90 capsule 0  . gabapentin (NEURONTIN) 800 MG tablet Take 800 mg by mouth. 1 tablet in the morning and 2 tablets at bedtime.    Marland Kitchen glucagon (GLUCAGON EMERGENCY) 1 MG injection Inject 1 mg into the muscle once as needed. 1 each 12  . glucose blood (FREESTYLE LITE) test strip Use to check blood sugar 6 times per day. Dx Code E11.9 600 each 2  . insulin aspart (NOVOLOG FLEXPEN) 100 UNIT/ML FlexPen Inject up to 20 units a day, also including sliding scale. 15 pen 3  . Insulin Glargine (LANTUS SOLOSTAR) 100 UNIT/ML Solostar Pen Inject 28 units daily including sliding scale 15 pen 11  . Insulin Syringes, Disposable, U-100 1 ML MISC To use for insulin injection.. 100 each 1  . lamoTRIgine (LAMICTAL) 200 MG tablet Take 1 tablet (200 mg  total) by mouth daily. 90 tablet 0  . Lancets (FREESTYLE) lancets Use to test blood sugar 4 to 6 times daily as instructed. 600 each 1  . linaclotide (LINZESS) 290 MCG CAPS capsule Take 1 capsule (290 mcg total) by mouth daily before breakfast. 90 capsule 4  . metFORMIN (GLUCOPHAGE) 500 MG tablet TAKE 1 TABLET TWICE A DAY WITH MEALS 180 tablet 11  . omeprazole (PRILOSEC) 40 MG capsule Take 1 capsule (40 mg total) by mouth 2 (two) times daily. 180 capsule 3  . ondansetron (ZOFRAN ODT) 4 MG disintegrating tablet Take 1 tablet (4 mg total) by mouth 3 (three) times daily with meals. 90 tablet 4  .  hyoscyamine (LEVSIN SL) 0.125 MG SL tablet Place 1 tablet (0.125 mg total) under the tongue every 4 (four) hours as needed. 120 tablet 1   No current facility-administered medications for this visit.     Allergies as of 10/03/2016 - Review Complete 10/03/2016  Allergen Reaction Noted  . Abilify [aripiprazole]  08/10/2016  . Reglan [metoclopramide] Other (See Comments) 03/23/2013    Family History  Problem Relation Age of Onset  . Hypertension Mother   . Bipolar disorder Mother   . Cancer Mother   . Heart disease Brother   . Thyroid disease Sister   . Hypertension Sister   . Bipolar disorder Sister   . Depression Father   . Cancer Father   . Bipolar disorder Daughter   . Bipolar disorder Maternal Aunt   . Colon cancer Neg Hx   . Colon polyps Neg Hx   . Esophageal cancer Neg Hx   . Kidney disease Neg Hx   . Gallbladder disease Neg Hx   . Diabetes Neg Hx   . Dementia Neg Hx     Social History   Social History  . Marital status: Married    Spouse name: N/A  . Number of children: 1  . Years of education: 74   Occupational History  . Disabled    Social History Main Topics  . Smoking status: Never Smoker  . Smokeless tobacco: Never Used  . Alcohol use No  . Drug use: No  . Sexual activity: Not Currently   Other Topics Concern  . Not on file   Social History Narrative   Lives at home w/ her husband   Left-handed   Caffeine: 1-2 cups of coffee daily    Review of Systems:    Constitutional: No weight loss, fever or chills Skin: No rash  Cardiovascular: No chest pain Respiratory: No SOB  Gastrointestinal: See HPI and otherwise negative Psychiatric: Positive for anxiety and depression   Physical Exam:  Vital signs: BP (!) 144/72   Pulse 84   Ht  (1.626 m)   Wt 163 lb (73.9 kg)   BMI 27.98 kg/m   Constitutional: Caucasian female appears to be in NAD, Well developed, Well nourished, alert and cooperative Respiratory: Respirations even and  unlabored. Lungs clear to auscultation bilaterally.   No wheezes, crackles, or rhonchi.  Cardiovascular: Normal S1, S2. No MRG. Regular rate and rhythm. No peripheral edema, cyanosis or pallor.  Gastrointestinal:  Soft, nondistended, nontender. No rebound or guarding. Increased bowel sounds all 4 quadrants No appreciable masses or hepatomegaly. Psychiatric: Demonstrates good judgement and reason without abnormal affect or behaviors.  No recent labs or imaging.   Assessment: 1. 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 Abdominal cramping: Likely related to known IBS and increase in stress/ anxiety after recent sinus infection  and antibiotic usage  Plan: 1. Prescribed Hyoscyamine sulfate 0.125 mg every 4-6 hours #120 with 1 refill 2. Recommend the patient start daily Align. Provided her with coupons. She should use this daily for at least 2 months 3. Patient may continue Zofran every 4-6 hours as needed for nausea 4. Discussed her IBS and how this may been worse after recent antibiotic usage 5. Continue Omeprazole 40 mg twice a day 6.  Patient to return to clinic with me in 4-6 weeks.  Hyacinth Meeker, PA-C [facility] 10/03/2016, 10:39 AM  Cc: Assunta Found, MD

## 2016-10-04 NOTE — Telephone Encounter (Signed)
I am okay.  She can see Dr. Donell Beers

## 2016-10-07 ENCOUNTER — Other Ambulatory Visit (HOSPITAL_COMMUNITY): Payer: Self-pay | Admitting: Psychiatry

## 2016-10-07 DIAGNOSIS — F3131 Bipolar disorder, current episode depressed, mild: Secondary | ICD-10-CM

## 2016-10-09 ENCOUNTER — Telehealth: Payer: Self-pay | Admitting: Physician Assistant

## 2016-10-09 ENCOUNTER — Other Ambulatory Visit (HOSPITAL_COMMUNITY): Payer: Self-pay

## 2016-10-09 DIAGNOSIS — F411 Generalized anxiety disorder: Secondary | ICD-10-CM

## 2016-10-09 DIAGNOSIS — F3131 Bipolar disorder, current episode depressed, mild: Secondary | ICD-10-CM

## 2016-10-09 MED ORDER — DIAZEPAM 5 MG PO TABS: ORAL_TABLET | ORAL | 0 refills | Status: DC

## 2016-10-09 MED ORDER — LAMOTRIGINE 200 MG PO TABS: 200.0000 mg | ORAL_TABLET | Freq: Every day | ORAL | 0 refills | Status: DC

## 2016-10-09 MED ORDER — FLUOXETINE HCL 40 MG PO CAPS: 40.0000 mg | ORAL_CAPSULE | Freq: Every day | ORAL | 0 refills | Status: DC

## 2016-10-09 NOTE — Telephone Encounter (Signed)
Left message for pt that it is fine for her to take her levsin every 4 hours as needed until her OV.

## 2016-10-11 ENCOUNTER — Telehealth: Payer: Self-pay | Admitting: Physician Assistant

## 2016-10-12 MED ORDER — HYOSCYAMINE SULFATE 0.125 MG SL SUBL: 0.1250 mg | SUBLINGUAL_TABLET | SUBLINGUAL | 3 refills | Status: DC | PRN

## 2016-10-12 NOTE — Telephone Encounter (Signed)
The pt was advised to continue meds until seen on 11/1 and refill [facility] to [facility]

## 2016-10-16 ENCOUNTER — Telehealth: Payer: Self-pay | Admitting: Gastroenterology

## 2016-10-16 NOTE — Telephone Encounter (Signed)
Patient reviewed the plan for dealing with her nausea. She has felt very tired lately and is frustrated about this. Discussed easy to prepare non-greasy or spicy foods. Discussed medications and what they should help with. Discussed slowly increasing activity but not to over do.

## 2016-10-17 ENCOUNTER — Telehealth: Payer: Self-pay | Admitting: Physician Assistant

## 2016-10-17 NOTE — Telephone Encounter (Signed)
The pt states she has continued nausea, she is taking Zofran and levsin on a scheduled basis.  She was offered an earlier appt with Victorino DikeJennifer but declined.  She will call if she decides to come in sooner.  She was also advised to go to a full liquid diet as well

## 2016-10-17 NOTE — Progress Notes (Signed)
Reviewed and agree with documentation and assessment and plan. K. Veena Hanny Elsberry , MD   

## 2016-10-18 ENCOUNTER — Other Ambulatory Visit: Payer: Self-pay | Admitting: Internal Medicine

## 2016-10-25 ENCOUNTER — Ambulatory Visit (HOSPITAL_COMMUNITY): Payer: Self-pay | Admitting: Psychiatry

## 2016-11-01 ENCOUNTER — Telehealth: Payer: Self-pay | Admitting: Physician Assistant

## 2016-11-01 ENCOUNTER — Ambulatory Visit (INDEPENDENT_AMBULATORY_CARE_PROVIDER_SITE_OTHER): Admitting: Physician Assistant

## 2016-11-01 ENCOUNTER — Encounter: Payer: Self-pay | Admitting: Physician Assistant

## 2016-11-01 VITALS — BP 132/72 | HR 77 | Ht 64.0 in | Wt 166.0 lb

## 2016-11-01 DIAGNOSIS — R1084 Generalized abdominal pain: Secondary | ICD-10-CM | POA: Diagnosis not present

## 2016-11-01 DIAGNOSIS — R110 Nausea: Secondary | ICD-10-CM

## 2016-11-01 DIAGNOSIS — R109 Unspecified abdominal pain: Secondary | ICD-10-CM

## 2016-11-01 MED ORDER — ONDANSETRON 4 MG PO TBDP: 4.0000 mg | ORAL_TABLET | ORAL | 3 refills | Status: DC

## 2016-11-01 MED ORDER — ONDANSETRON 4 MG PO TBDP: ORAL_TABLET | ORAL | 3 refills | Status: DC

## 2016-11-01 NOTE — Telephone Encounter (Signed)
Spoke to patient, she informed me that it was a misunderstanding and that they had not yet received the new rx. She will call back if she continues to have problems with the prescription.

## 2016-11-01 NOTE — Telephone Encounter (Signed)
Patient informed that refill has been sent to [facility] and written for 1-2 tablets every 4-6 hours as needed for nausea with 3 refills. I told her that we could only give her #180 for 90 day supply or else insurance may not cover it. She verbalized understanding.

## 2016-11-01 NOTE — Patient Instructions (Signed)
We have sent the following medications to your pharmacy for you to pick up at your convenience: Zofran 4 mg 1-2 tablets every 4-6 hours as needed for nausea.

## 2016-11-01 NOTE — Telephone Encounter (Signed)
Patient states medication zofran was supposed to be 90 day supply for 2 tablets every 4 hours, with 3 refills. Patient would like a call to know if Victorino DikeJennifer changed what she wanted her to do or if they can call it in they way she thought. Patient requesting a call when done. [facility]

## 2016-11-01 NOTE — Progress Notes (Signed)
Chief Complaint: Follow-up of nausea and abdominal cramping  HPI:   [name] is a 61 year old Caucasian female with a past medical history as listed below, who returns to clinic today for follow-up of her nausea and abdominal cramping.   Patient follows with Dr. Lavon Paganini. Please recall patient was seen in clinic 10/03/16 and had a complaint of abdominal cramping and nausea. At that time, she had been using her Omeprazole 40 mg twice a day which was helping some, but had taken antibiotics for sinus infection and became "sick to her stomach". Patient also described a "flare of her fibromyalgia" and a "spasming of my intestines and throat". Patient was using her Zofran on a daily basis. She also described a change in her bowel habits after antibiotics.  At that time patient was prescribed Hyoscyamine sulfate to be taken every 4-6 hours. She was also started on Align and told to continue her Zofran as needed for nausea. She was continued on Omeprazole 40 mg twice a day.    Today, the patient presents to clinic and tells me that her intestinal cramping and "throat cramp" are much better. She had been using her Levsin 3 times a day. Patient does tell me though that she continues with chronic nausea, though yesterday she did not have any and today she has none. Patient explains that typically at least 4-5 days out of the week she has to use Zofran 4 mg 2 tablets every 4 hours to abate her nausea (sometimes less than this, pending on level of nausea). Patient describes that she feels as though this is linked to anxiety and if she ever has to leave her house or is under a lot of stress. Patient reminds me that this has been worked up extensively in the past and was thought related to psychological issues. Patient is okay with using Zofran as this helps, but would like [place] to refill this.    Patient also describes trouble with sleeping. She has an appointment with her primary care physician next week. I encouraged  her to bring this up with him.   Patient denies fever, chills, blood in her stool, melena, weight loss, anorexia, heartburn or reflux.  Past Medical History:  Diagnosis Date  . Anxiety   . Bipolar 1 disorder (HCC)   . Breast density 06/25/2012   Right breast density, will get mammogram and [place]  . Depression   . Diabetes mellitus without complication (HCC)   . Duodenitis   . HTN (hypertension)   . Hyperlipidemia   . IBS (irritable bowel syndrome)     Past Surgical History:  Procedure Laterality Date  . ABDOMINAL HYSTERECTOMY    . CHOLECYSTECTOMY    . COLONOSCOPY      Current Outpatient Prescriptions  Medication Sig Dispense Refill  . AMBULATORY NON FORMULARY MEDICATION Squatty Potty x 1 1 each 0  . amLODipine (NORVASC) 5 MG tablet Take 5 mg by mouth daily.    . BD PEN NEEDLE NANO U/F 32G X 4 MM MISC Use with insulins. 100 each 5  . BENICAR 40 MG tablet     . diazepam (VALIUM) 5 MG tablet Take twice daily as needed 180 tablet 0  . FLUoxetine (PROZAC) 40 MG capsule Take 1 capsule (40 mg total) by mouth daily. 90 capsule 0  . FREESTYLE LITE test strip USE TO CHECK BLOOD SUGAR 6 TIMES PER DAY 600 each 2  . gabapentin (NEURONTIN) 800 MG tablet Take 800 mg by mouth. 1 tablet in  the morning and 2 tablets at bedtime.    Marland Kitchen. glucagon (GLUCAGON EMERGENCY) 1 MG injection Inject 1 mg into the muscle once as needed. 1 each 12  . hyoscyamine (LEVSIN SL) 0.125 MG SL tablet Place 1 tablet (0.125 mg total) under the tongue every 4 (four) hours as needed. 120 tablet 3  . insulin aspart (NOVOLOG FLEXPEN) 100 UNIT/ML FlexPen Inject up to 20 units a day, also including sliding scale. 15 pen 3  . Insulin Glargine (LANTUS SOLOSTAR) 100 UNIT/ML Solostar Pen Inject 28 units daily including sliding scale 15 pen 11  . Insulin Syringes, Disposable, U-100 1 ML MISC To use for insulin injection.. 100 each 1  . lamoTRIgine (LAMICTAL) 200 MG tablet Take 1 tablet (200 mg total) by mouth daily. 90 tablet 0  .  Lancets (FREESTYLE) lancets Use to test blood sugar 4 to 6 times daily as instructed. 600 each 1  . linaclotide (LINZESS) 290 MCG CAPS capsule Take 1 capsule (290 mcg total) by mouth daily before breakfast. 90 capsule 4  . metFORMIN (GLUCOPHAGE) 500 MG tablet TAKE 1 TABLET TWICE A DAY WITH MEALS 180 tablet 11  . omeprazole (PRILOSEC) 40 MG capsule Take 1 capsule (40 mg total) by mouth 2 (two) times daily. 180 capsule 3  . ondansetron (ZOFRAN ODT) 4 MG disintegrating tablet Take 1 tablet (4 mg total) by mouth 3 (three) times daily with meals. 90 tablet 4   No current facility-administered medications for this visit.     Allergies as of 11/01/2016 - Review Complete 10/03/2016  Allergen Reaction Noted  . Abilify [aripiprazole]  08/10/2016  . Reglan [metoclopramide] Other (See Comments) 03/23/2013    Family History  Problem Relation Age of Onset  . Hypertension Mother   . Bipolar disorder Mother   . Cancer Mother   . Heart disease Brother   . Thyroid disease Sister   . Hypertension Sister   . Bipolar disorder Sister   . Depression Father   . Cancer Father   . Bipolar disorder Daughter   . Bipolar disorder Maternal Aunt   . Colon cancer Neg Hx   . Colon polyps Neg Hx   . Esophageal cancer Neg Hx   . Kidney disease Neg Hx   . Gallbladder disease Neg Hx   . Diabetes Neg Hx   . Dementia Neg Hx     Social History   Social History  . Marital status: Married    Spouse name: N/A  . Number of children: 1  . Years of education: 112   Occupational History  . Disabled    Social History Main Topics  . Smoking status: Never Smoker  . Smokeless tobacco: Never Used  . Alcohol use No  . Drug use: No  . Sexual activity: Not Currently   Other Topics Concern  . Not on file   Social History Narrative   Lives at home w/ her husband   Left-handed   Caffeine: 1-2 cups of coffee daily    Review of Systems:    Constitutional: No weight loss, fever or chills Cardiovascular: No chest  pain  Respiratory: No SOB  Gastrointestinal: See HPI and otherwise negative   Physical Exam:  Vital signs: BP 132/72   Pulse 77   Ht 5\' 4"  (1.626 m)   Wt 166 lb (75.3 kg)   BMI 28.49 kg/m    Constitutional:   Pleasant Caucasian female appears to be in NAD, Well developed, Well nourished, alert and cooperative Respiratory: Respirations even and  unlabored. Lungs clear to auscultation bilaterally.   No wheezes, crackles, or rhonchi.  Cardiovascular: Normal S1, S2. No MRG. Regular rate and rhythm. No peripheral edema, cyanosis or pallor.  Gastrointestinal:  Soft, nondistended, nontender. No rebound or guarding. Normal bowel sounds. No appreciable masses or hepatomegaly. Psychiatric:  Demonstrates good judgement and reason without abnormal affect or behaviors.  No recent labs or imaging.  Assessment: 1. Nausea: Continues, patient uses when necessary Zofran, sometimes 8 mg 3-4 times a day, workup in the past with negative EGD in 2016, patient recalls being nauseous even as a young child, as well as related to psychological issues in the past which is likely the case. 2. Abdominal cramping: Improved on Levsin  Plan: 1. Refilled patient's Zofran 4mg  1-2 tabs every 4-6 hours #120 with 11 refills 2. Discussed with patient that I believe her nausea is related to psychological issues. Encouraged her to find a good psychiatrist who can help her with all of this. 3. Patient may continue Levsin as needed, she could try backing off of this as she is no longer experiencing any abdominal cramping 4. Patient to follow in clinic as needed in the future with Dr. Lavon Paganini or myself.  Hyacinth Meeker, PA-C [facility] 11/01/2016, 9:19 AM  Cc: Assunta Found, MD

## 2016-11-02 NOTE — Progress Notes (Signed)
Reviewed and agree with documentation and assessment and plan. K. Veena Nandigam , MD   

## 2016-11-07 ENCOUNTER — Ambulatory Visit (INDEPENDENT_AMBULATORY_CARE_PROVIDER_SITE_OTHER): Admitting: Internal Medicine

## 2016-11-07 ENCOUNTER — Encounter: Payer: Self-pay | Admitting: Internal Medicine

## 2016-11-07 VITALS — BP 134/84 | HR 85 | Wt 163.0 lb

## 2016-11-07 DIAGNOSIS — E109 Type 1 diabetes mellitus without complications: Secondary | ICD-10-CM | POA: Diagnosis not present

## 2016-11-07 DIAGNOSIS — Z23 Encounter for immunization: Secondary | ICD-10-CM

## 2016-11-07 DIAGNOSIS — E139 Other specified diabetes mellitus without complications: Secondary | ICD-10-CM

## 2016-11-07 LAB — MICROALBUMIN / CREATININE URINE RATIO
Creatinine,U: 60.4 mg/dL
MICROALB UR: 3.3 mg/dL — AB (ref 0.0–1.9)
Microalb Creat Ratio: 5.4 mg/g (ref 0.0–30.0)

## 2016-11-07 LAB — POCT GLYCOSYLATED HEMOGLOBIN (HGB A1C): HEMOGLOBIN A1C: 5.6

## 2016-11-07 LAB — LIPID PANEL
Cholesterol: 187 mg/dL (ref 0–200)
HDL: 50.7 mg/dL (ref >39.00)
LDL Cholesterol: 113 mg/dL — ABNORMAL HIGH (ref 0–99)
NONHDL: 135.9
Total CHOL/HDL Ratio: 4
Triglycerides: 113 mg/dL (ref 0.0–149.0)
VLDL: 22.6 mg/dL (ref 0.0–40.0)

## 2016-11-07 LAB — TSH: TSH: 0.57 u[IU]/mL (ref 0.35–4.50)

## 2016-11-07 NOTE — Patient Instructions (Addendum)
Please continue: - Lantus 28 units at bedtime  - NovoLog:   6-7 units before b'fast 7-9 units before lunch and dinner - Novolog Sliding scale: 150-200: + 1 unit 201-250: + 2 units 251-300: + 3 units 301-350: + 4 units >350: + 5 units - Metformin 500 mg x2 at night  Please stop at the lab.  Please return in 3 months with your sugar log.

## 2016-11-07 NOTE — Progress Notes (Signed)
Patient ID: Sabrina ComptonBarbara L Portee, female   DOB: 10/06/1955, 61 y.o.   MRN: [id number]  HPI: Sabrina ComptonBarbara L Delker is a 61 y.o.-year-old female, initially referred by her PCP, Dr. Assunta FoundJohn Golding (PA: Lenise HeraldBenjamin Mann), returning for f/u for LADA, dx 2005, insulin-dependent since ~2006, controlled, with complications (PN, gastroparesis?). Last visit 3 mo ago.  Reviewed hx:: She was on an Omnipod insulin pump on 08/01/2015. She had severe anxiety and depression and got so overwhelmed with the insulin pump that we had to take her off the pump.   We switched her to a basal-bolus insulin regimen, but she was unable to calculate the insulin doses based on insulin to carb ratio, so now she is using fixed rapid acting insulin doses.  Previous fructosamine levels: 08/07/2016: HbA1c calculated from the fructosamine is 7.5% 05/31/2016: HbA1c calculated from fructosamine is better, at 7.25% 12/15/2015: HbA1c calculated from the fructosamine is higher, 8.4%. 08/24/2015:  HbA1c calculated from fructosamine is 7.4%. 05/24/2015: HbA1c calculated from fructosamine is 6.9%. ... Lab Results  Component Value Date   HGBA1C 4.6 07/16/2013   [facility]16/2013   HGBA1C 4.8 04/15/2012  - 02/12/2013: 4.2% - 12/19/2012: 5.2% - 09/18/2012: 6.5%  She is now on: - Lantus 28 units at bedtime  - NovoLog:   6-7 units before b'fast 7-9 units before lunch and dinner - Novolog Sliding scale: 150-200: + 1 unit 201-250: + 2 units 251-300: + 3 units 301-350: + 4 units >350: + 5 units - Metformin 500 mg x2 at night   Pt checks her sugars 4x a day  - per detailed log - fluctuating - am: 125-190, 232 >> 62, 131-222 >> 33, 87-190, 218, 235 - 2h after b'fast:  n/c >> 215 >> 99, 345 >> 98 - before lunch: 51, 55-215 >> 32-137, 182 >> 45, 55, 75-187 - 2h after lunch:  46, 58 >> 83 >> 32 x1 - before dinner:  40, 60, 74-132 >> 66, 70-233 >> 50, 60-195, 201, 226, 266 - 2h after dinner: 1 211-365 >> 162 >> n/c - bedtime: 47,  75-187, 249 >> 38, 49-165, 222 >> 82-181, 271, 300 - at night: 116 >> 82, 250 >> 90 Lowest sugar was 33 (when she was sick at not eat) >> 40 >> 32 >> 33; she has hypoglycemia awareness at 50. Highest sugar was 300s >> 233 >> 300.  Pt's meals are: - Breakfast: toast, bowl of cereal + 2% milk (2x a week) or oatmeal, occas. eggs - Lunch: soup, sometimes sandwich, vegetables - Dinner: chicken/pork/beef + vegetable, no starch - Snacks: sugar free sweets  - No CKD, last BUN/creatinine:  Lab Results  Component Value Date   BUN 15 08/24/2015   CREATININE 0.59 08/24/2015   No results found for: MICRALBCREAT   - Lipids: Lab Results  Component Value Date   CHOL 206 (H) 08/24/2015   HDL 51.60 08/24/2015   LDLCALC 133 (H) 08/24/2015   TRIG 109.0 08/24/2015   CHOLHDL 4 08/24/2015  Not on a statin.   - last eye exam was in [place] in 09/2016 >> No DR. + small cataract. My Eye Dr: Dr. Janne LabMark Cottle. - + numbness and tingling in her feet. On Neurontin. She is seeing a podiatrist [facility]). She had 2 toenails removed due to fungal inf.  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 Also: GERD, HTN, HL, Fibromyalgia >> on Neurontin  Last  TSH normal: Lab Results  Component Value Date   TSH 0.81 08/24/2015   ROS: Constitutional: no weight gain/no weight loss, no fatigue, no subjective hyperthermia, no subjective hypothermia Eyes: no blurry vision, no xerophthalmia ENT: no sore throat, no nodules palpated in throat, no dysphagia, no odynophagia, no hoarseness Cardiovascular: no CP/no SOB/no palpitations/no leg swelling Respiratory: no cough/no SOB/no wheezing Gastrointestinal: no N/no V/no D/no C/no acid reflux Musculoskeletal: no muscle aches/no joint aches Skin: no rashes, no hair loss Neurological: no tremors/+ numbness/+ tingling/no dizziness  I reviewed pt's medications, allergies, PMH, social hx, family hx,  and changes were documented in the history of present illness. Otherwise, unchanged from my initial visit note.  PE: BP 134/84 (BP Location: Left Arm, Patient Position: Sitting)   Pulse 85   Wt 163 lb (73.9 kg)   SpO2 97%   BMI 27.98 kg/m    Wt Readings from Last 3 Encounters:  11/07/16 163 lb (73.9 kg)  11/01/16 166 lb (75.3 kg)  10/03/16 163 lb (73.9 kg)   Constitutional: overweight, in NAD Eyes: PERRLA, EOMI, no exophthalmos ENT: moist mucous membranes, no thyromegaly, no cervical lymphadenopathy Cardiovascular: RRR, No MRG Respiratory: CTA B Gastrointestinal: abdomen soft, NT, ND, BS+ Musculoskeletal: no deformities, strength intact in all 4 Skin: moist, warm, no rashes Neurological: no tremor with outstretched hands, DTR normal in all 4  ASSESSMENT: 1. LADA, insulin-dependent, uncontrolled, with complications - peripheral neuropathy - gastroparesis?  Component     Latest Ref Rng 07/16/2013  C-Peptide     0.80 - 3.90 ng/mL 1.29  Glucose     70 - 99 mg/dL 811 (H)  Glutamic Acid Decarb Ab     <=1.0 U/mL 11.3 (H)  Pancreatic Islet Cell Antibody     <5 JDF Units 5 (A)  + anti-pancreatic antibodies >> LADA rather than type 2 DM. She still has a positive C peptide >> still has insulin secretion. For this reason, we continued the Metformin.  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 Complex patient, with LADA, complicated by severe anxiety and depression.  She is on basal-bolus insulin regimen and also metformin, after coming off her insulin pump due to increased anxiety.  At last visit, she had more lows, in the 30s and 40s around lunchtime so we reduced the insulin with breakfast.  We also reduced the dose of Lantus as she also had some lows later in the day. - at this visit, sugars are a little more stable (more CBGs in the target) and fewer lows: had 2x 30s and 1x 45. I again advised her to start writing down what could cause the lows on her CBG log but the lows are not consistent enough  to be able to change the regimen. - We need to keep her insulin regimen as simple as possible, since she develops severe anxiety regarding calculating her insulin doses in the past - She does a great job with her sugar log, which we reviewed together -  I suggested to:  Patient Instructions  Please continue: - Lantus 28 units at bedtime  - NovoLog:   6-7 units before b'fast 7-9 units before lunch and dinner - Novolog Sliding scale: 150-200: + 1 unit 201-250: + 2 units 251-300: + 3 units 301-350: + 4 units >350: + 5 units - Metformin 500 mg x2 at night  Please return in 3 months with your sugar log.    - today, HbA1c is 5.6%. Will also check a fructosamine. 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 - stable, on Neurontin  Component     Latest Ref Rng & Units 11/07/2016          Glucose     65 - 99 mg/dL 86  BUN     7 - 25 mg/dL 14  Creatinine     1.610.50 - 0.99 mg/dL 0.960.58  GFR, Est Non African American     > OR = 60 mL/min/1.6273m2 98  GFR, Est African American     > OR = 60 mL/min/1.2173m2 114  BUN/Creatinine Ratio     6 - 22 (calc) NOT APPLICABLE  Sodium     135 - 146 mmol/L 138  Potassium     3.5 - 5.3 mmol/L 4.6  Chloride     98 - 110 mmol/L 102  CO2     20 - 32 mmol/L 29  Calcium     8.6 - 10.4 mg/dL 9.5  Total Protein     6.1 - 8.1 g/dL 7.1  Albumin MSPROF     3.6 - 5.1 g/dL 4.7  Globulin     1.9 - 3.7 g/dL (calc) 2.4  AG Ratio     1.0 - 2.5 (calc) 2.0  Total Bilirubin     0.2 - 1.2 mg/dL 0.3  Alkaline phosphatase (APISO)     33 - 130 U/L 86  AST     10 - 35 U/L 15  ALT     6 - 29 U/L 16  Cholesterol     0 - 200 mg/dL [id number]  Triglycerides     0.0 - 149.0 mg/dL 409.8113.0  HDL Cholesterol     >39.00 mg/dL 11.9150.70  VLDL     0.0 - 47.840.0 mg/dL 29.522.6  LDL  (calc)     0 - 99 mg/dL 621113 (H)  Total CHOL/HDL Ratio      4  NonHDL      135.90  Microalb, Ur     0.0 - 1.9 mg/dL 3.3 (H)  Creatinine,U     mg/dL 30.860.4  MICROALB/CREAT RATIO     0.0 - 30.0 mg/g 5.4  Hemoglobin A1C      5.6  TSH     0.35 - 4.50 uIU/mL 0.57  Fructosamine     190 - 270 umol/L 334 (H)   Best labs in a while! HbA1c calc. From the fructosamine is a little lower, at 7.25%  Carlus Pavlovristina Tawnia Schirm, MD PhD [facility]

## 2016-11-09 LAB — COMPLETE METABOLIC PANEL WITH GFR
AG RATIO: 2 (calc) (ref 1.0–2.5)
ALBUMIN MSPROF: 4.7 g/dL (ref 3.6–5.1)
ALKALINE PHOSPHATASE (APISO): 86 U/L (ref 33–130)
ALT: 16 U/L (ref 6–29)
AST: 15 U/L (ref 10–35)
BILIRUBIN TOTAL: 0.3 mg/dL (ref 0.2–1.2)
BUN: 14 mg/dL (ref 7–25)
CHLORIDE: 102 mmol/L (ref 98–110)
CO2: 29 mmol/L (ref 20–32)
Calcium: 9.5 mg/dL (ref 8.6–10.4)
Creat: 0.58 mg/dL (ref 0.50–0.99)
GFR, EST AFRICAN AMERICAN: 114 mL/min/{1.73_m2} (ref >=60)
GFR, Est Non African American: 98 mL/min/{1.73_m2} (ref >=60)
GLOBULIN: 2.4 g/dL (ref 1.9–3.7)
Glucose, Bld: 86 mg/dL (ref 65–99)
POTASSIUM: 4.6 mmol/L (ref 3.5–5.3)
SODIUM: 138 mmol/L (ref 135–146)
TOTAL PROTEIN: 7.1 g/dL (ref 6.1–8.1)

## 2016-11-09 LAB — FRUCTOSAMINE: Fructosamine: 334 umol/L — ABNORMAL HIGH (ref 190–270)

## 2016-12-07 ENCOUNTER — Other Ambulatory Visit: Payer: Self-pay

## 2016-12-07 ENCOUNTER — Telehealth (HOSPITAL_COMMUNITY): Payer: Self-pay

## 2016-12-07 ENCOUNTER — Encounter (HOSPITAL_COMMUNITY): Payer: Self-pay | Admitting: Psychiatry

## 2016-12-07 ENCOUNTER — Ambulatory Visit (INDEPENDENT_AMBULATORY_CARE_PROVIDER_SITE_OTHER): Admitting: Psychiatry

## 2016-12-07 DIAGNOSIS — Z818 Family history of other mental and behavioral disorders: Secondary | ICD-10-CM | POA: Diagnosis not present

## 2016-12-07 DIAGNOSIS — F3131 Bipolar disorder, current episode depressed, mild: Secondary | ICD-10-CM | POA: Diagnosis not present

## 2016-12-07 DIAGNOSIS — F341 Dysthymic disorder: Secondary | ICD-10-CM | POA: Diagnosis not present

## 2016-12-07 DIAGNOSIS — F411 Generalized anxiety disorder: Secondary | ICD-10-CM | POA: Diagnosis not present

## 2016-12-07 MED ORDER — DIAZEPAM 5 MG PO TABS: ORAL_TABLET | ORAL | 1 refills | Status: DC

## 2016-12-07 MED ORDER — DESIPRAMINE HCL 25 MG PO TABS: ORAL_TABLET | ORAL | 2 refills | Status: DC

## 2016-12-07 MED ORDER — FLUOXETINE HCL 40 MG PO CAPS: 40.0000 mg | ORAL_CAPSULE | Freq: Every day | ORAL | 0 refills | Status: DC
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
## 2016-12-07 NOTE — Telephone Encounter (Signed)
faxed and confirmed rx was sent . valium 5mg  #60 id # [id number] order # [id number]  [facility]

## 2016-12-11 ENCOUNTER — Telehealth (HOSPITAL_COMMUNITY): Payer: Self-pay

## 2016-12-11 NOTE — Telephone Encounter (Signed)
Medication management - Telephone call with Paul Dykesoreen, pharmacist at [facility] to verify pt's Valium orders were received from 12/07/16 and would be sent when pt. next due a refill as she got a 90 day supply and recieved it 10/19/16. Called patient to inform the new Valium order was received at [facility] and would be filled in January when next due to be sent.

## 2016-12-17 ENCOUNTER — Telehealth (HOSPITAL_COMMUNITY): Payer: Self-pay

## 2016-12-17 ENCOUNTER — Other Ambulatory Visit: Payer: Self-pay | Admitting: Internal Medicine

## 2016-12-17 NOTE — Telephone Encounter (Signed)
Patient is calling to see if the Desipramine can cause her to be drowsy, patient states she already has little energya nd since starting this new medication, she has felt even more drained. Please review and advise, thank you

## 2016-12-27 ENCOUNTER — Other Ambulatory Visit (HOSPITAL_COMMUNITY): Payer: Self-pay

## 2016-12-27 MED ORDER — DESIPRAMINE HCL 25 MG PO TABS: ORAL_TABLET | ORAL | 0 refills | Status: DC

## 2017-01-01 ENCOUNTER — Other Ambulatory Visit: Payer: Self-pay | Admitting: Gastroenterology

## 2017-01-02 ENCOUNTER — Telehealth: Payer: Self-pay | Admitting: Physician Assistant

## 2017-01-02 NOTE — Telephone Encounter (Signed)
The pt has been advised as long as she is getting better continue to force fluids and call back if she begins to have symptoms again.

## 2017-01-03 ENCOUNTER — Other Ambulatory Visit (HOSPITAL_COMMUNITY): Payer: Self-pay

## 2017-01-03 DIAGNOSIS — F411 Generalized anxiety disorder: Secondary | ICD-10-CM

## 2017-01-03 DIAGNOSIS — F3131 Bipolar disorder, current episode depressed, mild: Secondary | ICD-10-CM

## 2017-01-03 MED ORDER — DIAZEPAM 5 MG PO TABS: ORAL_TABLET | ORAL | 0 refills | Status: DC

## 2017-01-03 MED ORDER — DESIPRAMINE HCL 25 MG PO TABS: ORAL_TABLET | ORAL | 0 refills | Status: DC

## 2017-01-07 ENCOUNTER — Telehealth (HOSPITAL_COMMUNITY): Payer: Self-pay

## 2017-01-07 NOTE — Telephone Encounter (Signed)
Patient is currently taking Valium 5 mg 1 a day and one as needed. Patient was given a prescription for 135 tablets and would like that changed to 180, patient states she may not take 2 everyday, but it helps her anxiety to know that it is there if she needs it.

## 2017-01-09 ENCOUNTER — Other Ambulatory Visit (HOSPITAL_COMMUNITY): Payer: Self-pay

## 2017-01-09 DIAGNOSIS — F411 Generalized anxiety disorder: Secondary | ICD-10-CM

## 2017-01-09 DIAGNOSIS — F3131 Bipolar disorder, current episode depressed, mild: Secondary | ICD-10-CM

## 2017-01-09 MED ORDER — DIAZEPAM 5 MG PO TABS: ORAL_TABLET | ORAL | 0 refills | Status: DC

## 2017-01-10 ENCOUNTER — Ambulatory Visit: Payer: Self-pay | Admitting: Internal Medicine

## 2017-01-15 ENCOUNTER — Telehealth: Payer: Self-pay | Admitting: Physician Assistant

## 2017-01-15 NOTE — Telephone Encounter (Signed)
Spoke with the patient. She has rapid pressure speech and is talking of her anger, frustration and "doesn't want to live like this." Denies suicidal thoughts. She has an appointment with her "therapist" and states therapist is a "good person."  Patient asks if her stress level could affect her stomach. Her specific complaint is abdominal cramps. She had diarrhea last week for 2 days. That is better now. She took Miralax today. She has eaten. She has used Levsin for her abdominal cramps. States she knows she has IBS.  No further diarrhea, no bloody stools, no fever and no vomiting. Plan is to keep her appointment with mental health. To call them if she feels she should be seen sooner. She can use the Levsin as directed for abdominal cramps.  Is there anything else she should consider?

## 2017-01-15 NOTE — Telephone Encounter (Signed)
Noted  

## 2017-01-15 NOTE — Telephone Encounter (Signed)
It sounds as though she is doing everything she should be from a GI standpoint. It is certainly true that pain and nerves can worsen her IBS symptoms. Thanks-JLL

## 2017-02-07 ENCOUNTER — Ambulatory Visit (INDEPENDENT_AMBULATORY_CARE_PROVIDER_SITE_OTHER): Admitting: Internal Medicine

## 2017-02-07 ENCOUNTER — Encounter: Payer: Self-pay | Admitting: Internal Medicine

## 2017-02-07 VITALS — BP 140/72 | HR 73 | Temp 98.3°F | Ht 64.0 in | Wt 166.0 lb

## 2017-02-07 DIAGNOSIS — E109 Type 1 diabetes mellitus without complications: Secondary | ICD-10-CM

## 2017-02-07 DIAGNOSIS — E139 Other specified diabetes mellitus without complications: Secondary | ICD-10-CM

## 2017-02-07 DIAGNOSIS — G63 Polyneuropathy in diseases classified elsewhere: Secondary | ICD-10-CM

## 2017-02-07 DIAGNOSIS — G629 Polyneuropathy, unspecified: Secondary | ICD-10-CM | POA: Insufficient documentation

## 2017-02-07 MED ORDER — INSULIN ASPART 100 UNIT/ML FLEXPEN: PEN_INJECTOR | SUBCUTANEOUS | 11 refills | Status: DC

## 2017-02-07 NOTE — Progress Notes (Signed)
Patient ID: [name], female   DOB: 11/24/1955, 62 y.o.   MRN: [id number]  HPI: [name] is a 62 y.o.-year-old female, initially referred by her PCP, Dr. Assunta Found (PA: Lenise Herald), returning for f/u for LADA, dx 2005, insulin-dependent since ~2006, controlled, with complications (PN, gastroparesis?). Last visit 3 months ago.  Reviewed history: She was on an Omnipod insulin pump on 08/01/2015. She had severe anxiety and depression and got so overwhelmed with the insulin pump that we had to take her off the pump.   We switched her to a basal-bolus insulin regimen, but she was unable to calculate the insulin doses based on insulin to carb ratio, so now she is using fixed rapid acting insulin doses.  Previous fructosamine levels: 11/07/2016: HbA1c calc. from the fructosamine is a little lower, at 7.25% 08/07/2016: HbA1c calculated from the fructosamine is 7.5% 05/31/2016: HbA1c calculated from fructosamine is better, at 7.25% 12/15/2015: HbA1c calculated from the fructosamine is higher, 8.4%. 08/24/2015:  HbA1c calculated from fructosamine is 7.4%. 05/24/2015: HbA1c calculated from fructosamine is 6.9%. ... [name] Results  Component Value Date   HGBA1C 5.6 11/07/2016   HGBA1C 4.6 07/16/2013   HGBA1C sent to [facility] 07/16/2013  - 02/12/2013: 4.2% - 12/19/2012: 5.2% - 09/18/2012: 6.5%  She is now on: - Lantus 28 units at bedtime - NovoLog:   6-7 units before b'fast 7-9 units before lunch and dinner - Novolog Sliding scale: 150-200: + 1 unit 201-250: + 2 units 251-300: + 3 units 301-350: + 4 units >350: + 5 units - Metformin 500 mg x2 at night   Pt checks her sugars 4 times a day, per detail log - higher: - am: 62, 131-222 >> 33, 87-190, 218, 235 >> 65, 95-229, 275 - 2h after b'fast:  n/c >> 215 >> 99, 345 >> 98 >> 342 x1 - before lunch: 32-137, 182 >> 45, 55, 75-187 >> 89-260, 353 - 2h after lunch:  46, 58 >> 83 >> 32 x1 >> n/c - before dinner: 50, 60-195, 201, 226,  266 >> 87-217, 293, 315 - 2h after dinner: 1 211-365 >> 162 >> n/c - bedtime: 38, 49-165, 222 >> 82-181, 271, 300 >> 67, 89-204, 246 - at night: 116 >> 82, 250 >> 90 >> n/c Lowest sugar was 33 >> 65; she has hypoglycemia awareness  in the 50s. Highest sugar was 300 >> 342.  Pt's meals are: - Breakfast: toast, bowl of cereal + 2% milk (2x a week) or oatmeal, occas. eggs - Lunch: soup, sometimes sandwich, vegetables - Dinner: chicken/pork/beef + vegetable, no starch - Snacks: sugar free sweets  - No CKD, last BUN/creatinine:  [name] Results  Component Value Date   BUN 14 11/07/2016   CREATININE 0.58 11/07/2016   No MAU: [name] Results  Component Value Date   MICRALBCREAT 5.4 11/07/2016    -+ HL; lipids: [name] Results  Component Value Date   CHOL 187 11/07/2016   HDL 50.70 11/07/2016   LDLCALC 113 (H) 11/07/2016   TRIG 113.0 11/07/2016   CHOLHDL 4 11/07/2016  Not on a statin.  - last eye exam was in [place] in 09/2016: No DR.  + Small cataract my Eye Dr: Dr. Janne [name]. - + numbness and tingling in her feet.  On Neurontin. She is seeing a podiatrist [facility]). She had 2 toenails removed due to fungal inf.  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 She also has a dx of Parkinson ds.  And  bipolar disease. Also: GERD, HTN, Fibromyalgia >> on Neurontin  Last TSH normal at last visit: [name] Results  Component Value Date   TSH 0.57 11/07/2016   ROS: Constitutional: no weight gain/no weight loss, + fatigue, no subjective hyperthermia, no subjective hypothermia Eyes: no blurry vision, no xerophthalmia ENT: no sore throat, no nodules palpated in throat, no dysphagia, no odynophagia, no hoarseness Cardiovascular: no CP/no SOB/no palpitations/no leg swelling Respiratory: no cough/no SOB/no wheezing Gastrointestinal: no N/no V/no D/no C/no acid reflux Musculoskeletal: + muscle aches/no joint aches Skin: no rashes, no hair loss Neurological: + tremors/+  numbness/+ tingling/no dizziness  I reviewed pt's medications, allergies, PMH, social hx, family hx, and changes were documented in the history of present illness. Otherwise, unchanged from my initial visit note.  PE: BP 140/72 (BP Location: Left Arm, Patient Position: Sitting, Cuff Size: Normal)   Pulse 73   Temp 98.3 F (36.8 C) (Oral)   Ht 5\' 4"  (1.626 m)   Wt 166 lb (75.3 kg)   SpO2 98%   BMI 28.49 kg/m    Wt Readings from Last 3 Encounters:  02/07/17 166 lb (75.3 kg)  11/07/16 163 lb (73.9 kg)  11/01/16 166 lb (75.3 kg)   Constitutional: overweight, in NAD Eyes: PERRLA, EOMI, no exophthalmos ENT: moist mucous membranes, no thyromegaly, no cervical lymphadenopathy Cardiovascular: RRR, No MRG Respiratory: CTA B Gastrointestinal: abdomen soft, NT, ND, BS+ Musculoskeletal: no deformities, strength intact in all 4 Skin: moist, warm, no rashes Neurological: no tremor with outstretched hands, DTR normal in all 4  ASSESSMENT: 1. LADA, insulin-dependent, uncontrolled, with complications - peripheral neuropathy - gastroparesis?  Component     Latest Ref Rng 07/16/2013  C-Peptide     0.80 - 3.90 ng/mL 1.29  Glucose     70 - 99 mg/dL 161 (H)  Glutamic Acid Decarb Ab     <=1.0 U/mL 11.3 (H)  Pancreatic Islet Cell Antibody     <5 JDF Units 5 (A)  + anti-pancreatic antibodies >> LADA rather than type 2 DM. She still has a positive C peptide >> still has insulin secretion. For this reason, we continued the Metformin.  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  Complex patient, with LADA, complicated by severe anxiety and depression.  She is now on the basal-bolus insulin regimen and metformin, after coming off her insulin pump due to increased anxiety.  Sugars were a little bit more stable at last visit (more CBGs in the target range, and fewer lows.  I again advised her to start writing down what could cause the lows in her CBG log, but the low blood sugars were not consistent enough to be able  to change the regimen at that time.  We need to keep her insulin regimen as simple as possible, since she developed severe anxiety regarding calculating her insulin doses in the past.  She does a great job with her sugar log, which we reviewed together.  We also reviewed her labs obtained at last visit, which were better than in a long time.  An HbA1c calculated from the fructosamine was better, at 7.25%. - At this visit, sugars are slightly higher as she is more stressed recently, however, she does not have lows anymore.  We will going to try to increase the NovoLog doses by 1 unit to try to reduce the hyperglycemic spikes. -  I suggested to:  Patient Instructions  Please continue: - Metformin 500 mg x2 at night - Lantus 28 units at  bedtime  Please increase  - NovoLog: 9-10 before each meal  Continue: - Novolog Sliding scale: 150-200: + 1 unit 201-250: + 2 units 251-300: + 3 units 301-350: + 4 units >350: + 5 units  Please return in 3 months with your sugar log.    - We will check a fructosamine level today - continue checking sugars at different times of the day - check 3-4x a day, rotating checks - advised for yearly eye exams >> she is UTD - Return to clinic in 3 mo with sugar log   2. PN  - 2/2 DM - Stable, on Neurontin.  No side effects.  Office Visit on 02/07/2017  Component Date Value Ref Range Status  . Fructosamine 02/07/2017 358* 190 - 270 umol/L Final  HbA1c calculated from the fructosamine was higher, at 7.7%.  Carlus Pavlovristina Jakhari Space, MD PhD [facility]

## 2017-02-07 NOTE — Patient Instructions (Addendum)
Please continue: - Metformin 500 mg x2 at night - Lantus 28 units at bedtime  Please increase  - NovoLog: 9-10 before each meal  Continue: - Novolog Sliding scale: 150-200: + 1 unit 201-250: + 2 units 251-300: + 3 units 301-350: + 4 units >350: + 5 units  Please return in 3 months with your sugar log.

## 2017-02-08 ENCOUNTER — Ambulatory Visit (HOSPITAL_COMMUNITY): Payer: Self-pay | Admitting: Psychiatry

## 2017-02-08 LAB — FRUCTOSAMINE: Fructosamine: 358 umol/L — ABNORMAL HIGH (ref 190–270)

## 2017-02-14 ENCOUNTER — Other Ambulatory Visit: Payer: Self-pay | Admitting: Physician Assistant

## 2017-02-21 ENCOUNTER — Telehealth: Payer: Self-pay | Admitting: Physician Assistant

## 2017-02-21 NOTE — Telephone Encounter (Signed)
Plan: 1. Refilled patient's Zofran 4mg  1-2 tabs every 4-6 hours #120 with 11 refills 2. Discussed with patient that I believe her nausea is related to psychological issues. Encouraged her to find a good psychiatrist who can help her with all of this. 3. Patient may continue Levsin as needed, she could try backing off of this as she is no longer experiencing any abdominal cramping 4. Patient to follow in clinic as needed in the future with Dr. Lavon PaganiniNandigam or myself.  The pt has been scheduled for an appt with Dr Lavon PaganiniNandigam for April at 830 am.  She will continue zofran as needed and will call if she worsens.  She did state she has an appt with her pcp regarding her anxiety.

## 2017-03-01 ENCOUNTER — Other Ambulatory Visit (HOSPITAL_COMMUNITY): Payer: Self-pay

## 2017-03-01 DIAGNOSIS — F3131 Bipolar disorder, current episode depressed, mild: Secondary | ICD-10-CM

## 2017-03-01 MED ORDER — FLUOXETINE HCL 40 MG PO CAPS: 40.0000 mg | ORAL_CAPSULE | Freq: Every day | ORAL | 0 refills | Status: DC

## 2017-03-01 MED ORDER — DESIPRAMINE HCL 25 MG PO TABS: ORAL_TABLET | ORAL | 0 refills | Status: DC

## 2017-03-06 ENCOUNTER — Telehealth (HOSPITAL_COMMUNITY): Payer: Self-pay

## 2017-03-06 NOTE — Telephone Encounter (Signed)
Patient is calling to increase her disipramine, she states her anxiety I still pretty bad and the depression is just under the surface. She is wondering if increasing this medication will help. Please review and advise, thank you

## 2017-03-07 ENCOUNTER — Other Ambulatory Visit (HOSPITAL_COMMUNITY): Payer: Self-pay

## 2017-03-07 MED ORDER — DESIPRAMINE HCL 25 MG PO TABS: 50.0000 mg | ORAL_TABLET | Freq: Every day | ORAL | 1 refills | Status: DC

## 2017-03-07 NOTE — Telephone Encounter (Signed)
I called patient back and advised her that Dr. Donell BeersPlovsky said it would be fine to double the desipramine. Patient will call if she needs more medication before her appointment.

## 2017-03-11 ENCOUNTER — Encounter: Payer: Self-pay | Admitting: Physician Assistant

## 2017-03-11 ENCOUNTER — Ambulatory Visit (INDEPENDENT_AMBULATORY_CARE_PROVIDER_SITE_OTHER): Admitting: Physician Assistant

## 2017-03-11 VITALS — BP 160/88 | HR 80 | Ht 64.0 in | Wt 169.8 lb

## 2017-03-11 DIAGNOSIS — R110 Nausea: Secondary | ICD-10-CM | POA: Diagnosis not present

## 2017-03-11 DIAGNOSIS — K5909 Other constipation: Secondary | ICD-10-CM | POA: Diagnosis not present

## 2017-03-11 NOTE — Progress Notes (Signed)
Chief Complaint: Nausea  HPI:     [name] is a 62 year old Caucasian female with a past medical history as listed below, who returns to clinic today for a continued complaint of nausea.    Please recall patient follows with Dr. Lavon Paganini, last office visit 11/01/16 for nausea and abdominal cramping.  At last visit described intestinal cramping and a "throat cramp".  These were better and had been using her Levsin 3 times a day.  Continues with chronic nausea.  Typically at least 4-5 days of the week she had to use her Zofran 4 mg 2 tabs every 4 hours to abate her nausea, sometimes less depending on level of nausea.  Felt linked to anxiety.  Refilled Zofran 4 mg 1-2 tabs every 4-6 hours.  Also discussed that nausea was likely related to psychological issues and encouraged her to find a good psychiatrist who could help her with all this.  Recommend she continue Levsin as needed.    Called our clinic 02/21/17 discussing trouble sleeping due to nausea.  Patient called psychiatrist 03/06/17 and asked to increase her Desipramine discussing increased anxiety and depression.  This medication was doubled.    Today, explains that she has had worsening depression and anxiety recently and blames all of her chronic medical conditions on this.  Was started on Xanax 0.5 mg 3 times daily before meals and believes this is helped with her nausea, but has also led to weight gain per her.  Has been holding her Zofran ever since her Desipramine was increased to 50 mg daily about a week ago.  Has not noticed a change with increase in this medicine yet.  Describes her Linzess 290 mcg has stopped helping her over the past couple of months.  Has been using occasional MiraLAX as well which is not helping.  Very discouraged about all of her chronic medical conditions.    Also describes nausea which is continued off and on.  Currently, not using her Zofran as she is not experiencing it on a daily basis.    Denies fever, chills, blood  in her stool, melena, weight loss, anorexia, nausea, vomiting or abdominal pain.  Past Medical History:  Diagnosis Date  . Anxiety   . Bipolar 1 disorder (HCC)   . Breast density 06/25/2012   Right breast density, will get mammogram and [place]  . Depression   . Diabetes mellitus without complication (HCC)   . Duodenitis   . HTN (hypertension)   . Hyperlipidemia   . IBS (irritable bowel syndrome)     Past Surgical History:  Procedure Laterality Date  . ABDOMINAL HYSTERECTOMY    . CHOLECYSTECTOMY    . COLONOSCOPY      Current Outpatient Medications  Medication Sig Dispense Refill  . AMBULATORY NON FORMULARY MEDICATION Squatty Potty x 1 1 each 0  . amLODipine (NORVASC) 5 MG tablet Take 5 mg by mouth daily.    . BD PEN NEEDLE NANO U/F 32G X 4 MM MISC USE WITH LANTUS AND NOVOLOG PENS 200 each 4  . BENICAR 40 MG tablet     . desipramine (NORPRAMIN) 25 MG tablet Take 2 tablets (50 mg total) by mouth daily. 180 tablet 1  . diazepam (VALIUM) 5 MG tablet 1 tablet q day  And 1 prn 180 tablet 0  . FLUoxetine (PROZAC) 40 MG capsule Take 1 capsule (40 mg total) by mouth daily. 90 capsule 0  . FREESTYLE LITE test strip USE TO CHECK BLOOD SUGAR 6 TIMES  PER DAY 600 each 2  . gabapentin (NEURONTIN) 800 MG tablet Take 800 mg by mouth. 1 tablet in the morning and 2 tablets at bedtime.    Marland Kitchen glucagon (GLUCAGON EMERGENCY) 1 MG injection Inject 1 mg into the muscle once as needed. 1 each 12  . insulin aspart (NOVOLOG FLEXPEN) 100 UNIT/ML FlexPen Inject up to 12 units 3x a day under skin. 15 pen 11  . Insulin Glargine (LANTUS SOLOSTAR) 100 UNIT/ML Solostar Pen Inject 28 units daily including sliding scale 15 pen 11  . Insulin Syringes, Disposable, U-100 1 ML MISC To use for insulin injection.. 100 each 1  . lamoTRIgine (LAMICTAL) 200 MG tablet Take 1 tablet (200 mg total) by mouth daily. (Patient not taking: Reported on 02/07/2017) 90 tablet 0  . Lancets (FREESTYLE) lancets Use to test blood sugar 4 to 6  times daily as instructed. 600 each 1  . linaclotide (LINZESS) 290 MCG CAPS capsule Take 1 capsule (290 mcg total) by mouth daily before breakfast. 90 capsule 4  . metFORMIN (GLUCOPHAGE) 500 MG tablet TAKE 1 TABLET TWICE A DAY WITH MEALS 180 tablet 11  . omeprazole (PRILOSEC) 40 MG capsule Take 1 capsule (40 mg total) by mouth 2 (two) times daily. 180 capsule 3  . ondansetron (ZOFRAN ODT) 4 MG disintegrating tablet 1-2 tabs every 4-6 hours as needed four nausea 180 tablet 3  . OSCIMIN 0.125 MG SUBL DISSOLVE 1 TABLET UNDER THE TONGUE EVERY 4 HOURS AS NEEDED 120 each 3   No current facility-administered medications for this visit.     Allergies as of 03/11/2017 - Review Complete 02/07/2017  Allergen Reaction Noted  . Abilify [aripiprazole]  08/10/2016  . Reglan [metoclopramide] Other (See Comments) 03/23/2013    Family History  Problem Relation Age of Onset  . Hypertension Mother   . Bipolar disorder Mother   . Cancer Mother   . Heart disease Brother   . Thyroid disease Sister   . Hypertension Sister   . Bipolar disorder Sister   . Depression Father   . Cancer Father   . Bipolar disorder Daughter   . Bipolar disorder Maternal Aunt   . Colon cancer Neg Hx   . Colon polyps Neg Hx   . Esophageal cancer Neg Hx   . Kidney disease Neg Hx   . Gallbladder disease Neg Hx   . Diabetes Neg Hx   . Dementia Neg Hx     Social History   Socioeconomic History  . Marital status: Married    Spouse name: phillip  . Number of children: 1  . Years of education: 47  . Highest education level: High school graduate  Social Needs  . Financial resource strain: Not hard at all  . Food insecurity - worry: Never true  . Food insecurity - inability: Never true  . Transportation needs - medical: Yes  . Transportation needs - non-medical: Yes  Occupational History  . Occupation: Disabled  Tobacco Use  . Smoking status: Never Smoker  . Smokeless tobacco: Never Used  Substance and Sexual  Activity  . Alcohol use: No    Alcohol/week: 0.0 oz  . Drug use: No  . Sexual activity: Not Currently  Other Topics Concern  . Not on file  Social History Narrative   Lives at home w/ her husband   Left-handed   Caffeine: 1-2 cups of coffee daily    Review of Systems:    Constitutional: No weight loss, fever or chills Cardiovascular: No chest pain  Respiratory: No SOB  Gastrointestinal: See HPI and otherwise negative   Physical Exam:  Vital signs: BP (!) 160/88   Pulse 80   Ht 5\' 4"  (1.626 m)   Wt 169 lb 12.8 oz (77 kg)   BMI 29.15 kg/m    Constitutional:   Pleasant overweight Caucasian female appears to be in NAD, Well developed, Well nourished, alert and cooperative Respiratory: Respirations even and unlabored. Lungs clear to auscultation bilaterally.   No wheezes, crackles, or rhonchi.  Cardiovascular: Normal S1, S2. No MRG. Regular rate and rhythm. No peripheral edema, cyanosis or pallor.  Gastrointestinal:  Soft, nondistended, nontender. No rebound or guarding. Normal bowel sounds. No appreciable masses or hepatomegaly. Rectal:  Not performed.  Psychiatric:  Demonstrates good judgement and reason without abnormal affect or behaviors. Depressed  No recent labs or imaging.  Assessment: 1.  Chronic nausea: In the past controlled on as needed Zofran sometimes 8 mg 3-4 times a day, work in the past with negative EGD in 2016, patient recalls nausea as a young child and being related to psychological issues in the past, currently not using her Zofran daily and nausea only occasional 2.  Chronic constipation: Linzess 290 mcg is no longer helping over the past couple of months, also using as needed MiraLAX with little relief, cannot remember her last "normal" bowel movement, did have small pieces about 2 days ago; again IBS-C  Plan: 1.  Told the patient we could do a trial of Trulance 3 mg daily.  Provided her with samples of this.  If this works we can send in a  prescription. 2.  Patient will hold her Linzess during trial of above. 3.  Provided patient with Clenpique bowel prep and instructed her to do half today and half tomorrow. 4.  Encouraged the patient to follow-up with her counselor and psychiatrist regarding depression and anxiety.  Also encouraged the patient to get out and go for a walk as this would help with constipation as well as fibromyalgia and hopefully some of her psychological issues as well. 5.  Patient to follow in clinic with me in 4-6 weeks.  Sabrina MeekerJennifer Shahin Knierim, PA-C  Gastroenterology 03/11/2017, 9:36 AM  Cc: Assunta FoundGolding, John, MD

## 2017-03-11 NOTE — Patient Instructions (Signed)
We have given you samples of Clenpiq take 1 bottle today and 1 bottle tomorrow.  We have given you a sample of Trulance 3 mg to try once daily. If this works call back and let us know and we will send in a prescription.   Try to get out and walk more often.

## 2017-03-12 ENCOUNTER — Telehealth: Payer: Self-pay | Admitting: Physician Assistant

## 2017-03-12 NOTE — Telephone Encounter (Signed)
Patient states she has questions regarding how to do her stool test.

## 2017-03-12 NOTE — Telephone Encounter (Signed)
Left message on machine to call back  

## 2017-03-13 ENCOUNTER — Encounter (HOSPITAL_COMMUNITY): Payer: Self-pay | Admitting: Emergency Medicine

## 2017-03-13 ENCOUNTER — Emergency Department (HOSPITAL_COMMUNITY)
Admission: EM | Admit: 2017-03-13 | Discharge: 2017-03-13 | Disposition: A | Discharge status: Home / Self Care | Attending: Emergency Medicine | Admitting: Emergency Medicine

## 2017-03-13 ENCOUNTER — Other Ambulatory Visit: Payer: Self-pay

## 2017-03-13 DIAGNOSIS — E119 Type 2 diabetes mellitus without complications: Secondary | ICD-10-CM | POA: Insufficient documentation

## 2017-03-13 DIAGNOSIS — R110 Nausea: Secondary | ICD-10-CM | POA: Diagnosis not present

## 2017-03-13 DIAGNOSIS — Z79899 Other long term (current) drug therapy: Secondary | ICD-10-CM | POA: Diagnosis not present

## 2017-03-13 DIAGNOSIS — K5900 Constipation, unspecified: Secondary | ICD-10-CM | POA: Diagnosis not present

## 2017-03-13 DIAGNOSIS — I10 Essential (primary) hypertension: Secondary | ICD-10-CM | POA: Insufficient documentation

## 2017-03-13 DIAGNOSIS — Z794 Long term (current) use of insulin: Secondary | ICD-10-CM | POA: Diagnosis not present

## 2017-03-13 NOTE — ED Provider Notes (Signed)
[facility]-EMERGENCY DEPT Provider Note  CSN: [id number] Arrival date & time: 03/13/17 4098  Chief Complaint(s) Constipation  HPI [name] is a 62 y.o. female with a history of IBS (constipation) who presents to the emergency department with constipation.  Patient reports that she has not had a bowel movement and approximately 1 to 1-1/2 weeks.  Reports that she contacted her GI doctor who gave her a regimen which she took yesterday.  States that she did have a few loose bowel movements with small amounts of formed stool.  Patient still passing gas.  Endorses abdominal discomfort but no overt pain.  She endorses chronic nausea without emesis.  She denies any fevers or chills.  Denies any respiratory symptoms.  Denies any urinary symptoms.  Denies any other physical complaints.  HPI  Past Medical History Past Medical History:  Diagnosis Date  . Anxiety   . Bipolar 1 disorder (HCC)   . Breast density 06/25/2012   Right breast density, will get mammogram and [place]  . Depression   . Diabetes mellitus without complication (HCC)   . Duodenitis   . HTN (hypertension)   . Hyperlipidemia   . IBS (irritable bowel syndrome)    Patient Active Problem List   Diagnosis Date Noted  . Peripheral neuropathy 02/07/2017  . Constipation due to outlet dysfunction 05/13/2016  . Breast density 06/25/2012  . Encephalopathy, toxic 04/15/2012  . Lithium toxicity 04/15/2012  . Suicide ideation 04/15/2012  . LADA (latent autoimmune diabetes in adults), managed as type 1 (HCC)   . Depression   . IBS (irritable bowel syndrome)   . HTN (hypertension)   . Bipolar 1 disorder (HCC)   . Abnormality of gait 11/07/2011  . Muscle weakness (generalized) 11/07/2011   Home Medication(s) Prior to Admission medications   Medication Sig Start Date End Date Taking? Authorizing Provider  AMBULATORY NON FORMULARY MEDICATION Squatty Potty x 1 03/07/15   Nandigam, Eleonore Chiquito, MD  amLODipine  (NORVASC) 5 MG tablet Take 5 mg by mouth daily.    [provider]  BD PEN NEEDLE NANO U/F 32G X 4 MM MISC USE WITH LANTUS AND NOVOLOG PENS 12/17/16   Carlus Pavlov, MD  BENICAR 40 MG tablet  02/02/15   [provider]  desipramine (NORPRAMIN) 25 MG tablet Take 2 tablets (50 mg total) by mouth daily. 03/07/17   Plovsky, Earvin Hansen, MD  FLUoxetine (PROZAC) 40 MG capsule Take 1 capsule (40 mg total) by mouth daily. 03/01/17   Plovsky, Earvin Hansen, MD  FREESTYLE LITE test strip USE TO CHECK BLOOD SUGAR 6 TIMES PER DAY 10/19/16   Carlus Pavlov, MD  gabapentin (NEURONTIN) 800 MG tablet Take 800 mg by mouth. 1 tablet in the morning and 2 tablets at bedtime. 02/05/15   [provider]  glucagon (GLUCAGON EMERGENCY) 1 MG injection Inject 1 mg into the muscle once as needed. 08/07/16   Carlus Pavlov, MD  insulin aspart (NOVOLOG FLEXPEN) 100 UNIT/ML FlexPen Inject up to 12 units 3x a day under skin. 02/07/17   Carlus Pavlov, MD  Insulin Glargine (LANTUS SOLOSTAR) 100 UNIT/ML Solostar Pen Inject 28 units daily including sliding scale 08/07/16   Carlus Pavlov, MD  Insulin Syringes, Disposable, U-100 1 ML MISC To use for insulin injection.. 09/23/15   Carlus Pavlov, MD  Lancets (FREESTYLE) lancets Use to test blood sugar 4 to 6 times daily as instructed. 08/04/15   Carlus Pavlov, MD  linaclotide [facility]) 290 MCG CAPS capsule Take 1 capsule (290 mcg  total) by mouth daily before breakfast. 05/11/16   Napoleon Form, MD  metFORMIN (GLUCOPHAGE) 500 MG tablet TAKE 1 TABLET TWICE A DAY WITH MEALS 09/18/16   Carlus Pavlov, MD  omeprazole (PRILOSEC) 40 MG capsule Take 1 capsule (40 mg total) by mouth 2 (two) times daily. 03/15/16   Napoleon Form, MD  ondansetron (ZOFRAN ODT) 4 MG disintegrating tablet 1-2 tabs every 4-6 hours as needed four nausea 11/01/16   Unk Lightning, PA  OSCIMIN 0.125 MG SUBL DISSOLVE 1 TABLET UNDER THE TONGUE EVERY 4 HOURS AS NEEDED 02/14/17    Unk Lightning, PA                                                                                                                                    Past Surgical History Past Surgical History:  Procedure Laterality Date  . ABDOMINAL HYSTERECTOMY    . CHOLECYSTECTOMY    . COLONOSCOPY     Family History Family History  Problem Relation Age of Onset  . Hypertension Mother   . Bipolar disorder Mother   . Cancer Mother   . Heart disease Brother   . Thyroid disease Sister   . Hypertension Sister   . Bipolar disorder Sister   . Depression Father   . Cancer Father   . Bipolar disorder Daughter   . Bipolar disorder Maternal Aunt   . Colon cancer Neg Hx   . Colon polyps Neg Hx   . Esophageal cancer Neg Hx   . Kidney disease Neg Hx   . Gallbladder disease Neg Hx   . Diabetes Neg Hx   . Dementia Neg Hx     Social History Social History   Tobacco Use  . Smoking status: Never Smoker  . Smokeless tobacco: Never Used  Substance Use Topics  . Alcohol use: No    Alcohol/week: 0.0 oz  . Drug use: No   Allergies Abilify [aripiprazole] and Reglan [metoclopramide]  Review of Systems Review of Systems All other systems are reviewed and are negative for acute change except as noted in the HPI  Physical Exam Vital Signs  I have reviewed the triage vital signs BP 127/83 (BP Location: Right Arm)   Pulse 99   Temp 98.2 F (36.8 C) (Oral)   Resp 18   Ht 5\' 4"  (1.626 m)   Wt 74.8 kg (165 lb)   SpO2 97%   BMI 28.32 kg/m   Physical Exam  Constitutional: She is oriented to person, place, and time. She appears well-developed and well-nourished. No distress.  HENT:  Head: Normocephalic and atraumatic.  Right Ear: External ear normal.  Left Ear: External ear normal.  Nose: Nose normal.  Eyes: Conjunctivae and EOM are normal. No scleral icterus.  Neck: Normal range of motion and phonation normal.  Cardiovascular: Normal rate and regular rhythm.  Pulmonary/Chest: Effort  normal. No stridor. No respiratory distress.  Abdominal: She  exhibits no distension. There is no tenderness. There is no rigidity, no rebound and no guarding.  Genitourinary:  Genitourinary Comments: Chaperone present during pelvic exam.  Rectal exam without stool in the rectal vault.  Musculoskeletal: Normal range of motion. She exhibits no edema.  Neurological: She is alert and oriented to person, place, and time.  Skin: She is not diaphoretic.  Psychiatric: She has a normal mood and affect. Her behavior is normal.  Vitals reviewed.   ED Results and Treatments Labs (all labs ordered are listed, but only abnormal results are displayed) Labs Reviewed - No data to display                                                                                                                       EKG  EKG Interpretation  Date/Time:    Ventricular Rate:    PR Interval:    QRS Duration:   QT Interval:    QTC Calculation:   R Axis:     Text Interpretation:        Radiology No results found. Pertinent labs & imaging results that were available during my care of the patient were reviewed by me and considered in my medical decision making (see chart for details).  Medications Ordered in ED Medications - No data to display                                                                                                                                  Procedures Procedures  (including critical care time)  Medical Decision Making / ED Course I have reviewed the nursing notes for this encounter and the patient's prior records (if available in EHR or on provided paperwork).    Patient's abdomen is benign.  She still having bowel movements and passing gas and thus I doubt possible small bowel obstruction.  Patient is not impacted.  Recommended continued bowel regimen as recommended by her GI doctor.  Follow-up as needed.  The patient appears reasonably screened and/or stabilized for  discharge and I doubt any other medical condition or other [facility] requiring further screening, evaluation, or treatment in the ED at this time prior to discharge.   The patient is safe for discharge with strict return precautions.   Final Clinical Impression(s) / ED Diagnoses Final diagnoses:  Constipation, unspecified constipation type    Disposition: Discharge  Condition: Good  I have discussed the results, Dx  and Tx plan with the patient who expressed understanding and agree(s) with the plan. Discharge instructions discussed at great length. The patient was given strict return precautions who verbalized understanding of the instructions. No further questions at time of discharge.    ED Discharge Orders    None       Follow Up: Assunta FoundGolding, John, MD [address] [place] [id number] [phone]  Schedule an appointment as soon as possible for a visit  in 3-5 days, If symptoms do not improve or  worsen    This chart was dictated using voice recognition software.  Despite best efforts to proofread,  errors can occur which can change the documentation meaning.   Nira Connardama, Lillyanna Glandon Eduardo, MD 03/13/17 [phone]

## 2017-03-13 NOTE — Telephone Encounter (Signed)
Left message on machine to call back will wait for further communication from the pt  

## 2017-03-13 NOTE — ED Notes (Signed)
AJSA RN ASSISTING WITH CARE AT THIS TIME

## 2017-03-13 NOTE — Progress Notes (Signed)
Reviewed and agree with documentation and assessment and plan. K. Veena Corban Kistler , MD   

## 2017-03-13 NOTE — ED Triage Notes (Signed)
Pt verbalizes constipation over past 10 days; small, infrequent BMs for past 10 days post treatment.

## 2017-03-13 NOTE — ED Notes (Signed)
ED Provider at bedside. 

## 2017-03-15 ENCOUNTER — Telehealth: Payer: Self-pay | Admitting: Physician Assistant

## 2017-03-15 NOTE — Telephone Encounter (Signed)
Patient states medication trulance 3mg  seems to be helping and she would [facility]

## 2017-03-18 MED ORDER — PLECANATIDE 3 MG PO TABS: 3.0000 mg | ORAL_TABLET | Freq: Every day | ORAL | 3 refills | Status: DC

## 2017-03-18 NOTE — Telephone Encounter (Signed)
Rx sent to pharmacy   

## 2017-03-19 ENCOUNTER — Telehealth: Payer: Self-pay | Admitting: Physician Assistant

## 2017-03-19 NOTE — Telephone Encounter (Signed)
I received a fax today which I filled out and sent back to the insurance company. Left message on patients voicemail to inform her of the status. And will call as soon as I get an approval. In the meantime if she needs samples she can call the office.

## 2017-03-19 NOTE — Telephone Encounter (Signed)
Pt states insurance is requiring a prior authorization for Trulance. Phone # [phone].

## 2017-03-19 NOTE — Telephone Encounter (Signed)
See alternate phone note  

## 2017-03-20 NOTE — Telephone Encounter (Signed)
Pt calling back wanting update on PA.

## 2017-03-20 NOTE — Telephone Encounter (Signed)
The pt has been advised that samples of Trulance are at the front desk for her to pick up, she states that it would be 150 miles for her to come here.  I also advised her we have completed the prior auth and we are waiting for insurance response.  She states she did do the prep and had a good response.  She also went to the ED and was told she did not have a blockage.  She will do a miralax prep if she feels she needs it.

## 2017-03-20 NOTE — Telephone Encounter (Signed)
Thank you for following up. JLL

## 2017-03-20 NOTE — Telephone Encounter (Signed)
Patient states she will pick up samples this Friday, but also wants to make sure medication was still sent to pharmacy. Patient also wanting to let Sabrina DikeJennifer know that the bowel prep made her extremely sick.

## 2017-03-20 NOTE — Telephone Encounter (Signed)
Meds are at the front desk and meds were sent to the pharmacy

## 2017-03-20 NOTE — Telephone Encounter (Signed)
Patient called back very tearful and angry. Stating she has not had a bowel movement and is on her last pill for trulance and is worried she will not be able to have any and that it is the only thing that helps right now. I told patient we could provide her with some samples but she stated that she could not drive up her. I told her insurance usually takes a few days to process. She was very angry and stated "I guess I will just die" and hung up. I will continue to check for approval from insurance company.

## 2017-03-20 NOTE — Telephone Encounter (Signed)
Can you please call patient and see if she ever did bowel prep we povided at [facility], she could also try MIralax bowel purge. Thanks-JLL

## 2017-03-21 ENCOUNTER — Telehealth: Payer: Self-pay | Admitting: Physician Assistant

## 2017-03-21 NOTE — Telephone Encounter (Signed)
Patient says that [facility] told her that they do not have this rx. She is requesting a callback regarding this. [phone]

## 2017-03-21 NOTE — Telephone Encounter (Signed)
The prescription was sent by Deneise Leveresiree Brown CMA at her office visit

## 2017-03-21 NOTE — Telephone Encounter (Signed)
[facility] to [facility] to make sure they received prior auth form and they state they did have it they just needed to ask some additional questions. Answered all questions and Rx was approved for 1 year. They stated they will inform the patient and the pharmacy.

## 2017-03-21 NOTE — Telephone Encounter (Signed)
Need prior auth form for medication sent to number [phone]

## 2017-03-26 ENCOUNTER — Telehealth: Payer: Self-pay | Admitting: Physician Assistant

## 2017-03-26 NOTE — Telephone Encounter (Signed)
Left a message to call back. If she has diarrhea, she should hold the medication. Would like to discuss this with her.

## 2017-03-26 NOTE — Telephone Encounter (Signed)
Pt on Trulance since a month ago, has been having very loose stools, not diarrhea. She wants to know if this is normal or if medicine is not working. She does not have any pain or discomfort.

## 2017-03-27 NOTE — Telephone Encounter (Signed)
Spoke with the patient about her bowel movements. She states she is satisfied with her bowel movements. She states she is not bloated. Her bowel movements are soft to loose. She will keep us notified if there are any concerns.

## 2017-04-09 ENCOUNTER — Ambulatory Visit: Payer: Self-pay | Admitting: Gastroenterology

## 2017-04-17 ENCOUNTER — Telehealth: Payer: Self-pay | Admitting: Internal Medicine

## 2017-04-17 NOTE — Telephone Encounter (Signed)
Patient took her sugar level -over 200 (one time it was over 300) in the last 3 weeks. Patient has not changed diet at all. Please call patient at ph# [phone] to advise.

## 2017-04-22 ENCOUNTER — Telehealth: Payer: Self-pay | Admitting: Internal Medicine

## 2017-04-22 NOTE — Telephone Encounter (Signed)
I have on records the following regimen: - Metformin 500 mg x2 at night - Lantus 28 units at bedtime             - NovoLog: 9-10 before each meal  If this is what she is taking, I would suggest to try to split Lantus into 10 units in a.m and 20 units at bedtime.  I think I am seeing her in less than 2 weeks and see how this is going.

## 2017-04-22 NOTE — Telephone Encounter (Signed)
Patient stated her b/s has been in the 200's for two weeks, it was 342 Friday, stated she was eating all the things she was supposed to also taking her metformin, Novalog and Lantus at night, sometimes she feels a little jittery.  Called [facility]

## 2017-04-22 NOTE — Telephone Encounter (Signed)
Please advise 

## 2017-04-23 ENCOUNTER — Telehealth: Payer: Self-pay | Admitting: Gastroenterology

## 2017-04-23 NOTE — Telephone Encounter (Signed)
Pt voiced understanding of the instructions and stated that she would keep a record of blood sugars and would keep her upcoming appointment

## 2017-04-23 NOTE — Telephone Encounter (Signed)
The pt states that she has a lot of anxiety that she believes is causing her hernia pain.  She was advised to call her counselor to discuss anxiety.  The pt agreed.

## 2017-04-24 ENCOUNTER — Other Ambulatory Visit (HOSPITAL_COMMUNITY): Payer: Self-pay

## 2017-04-24 ENCOUNTER — Telehealth: Payer: Self-pay | Admitting: Internal Medicine

## 2017-04-24 MED ORDER — HYDROXYZINE HCL 25 MG PO TABS: 25.0000 mg | ORAL_TABLET | Freq: Two times a day (BID) | ORAL | 0 refills | Status: DC

## 2017-04-24 NOTE — Telephone Encounter (Signed)
Patient is concerned with her blood sugar readings.  4/1- 4/13 BS were over 200 she states 19 times.   3/17-3/30 BS were over 200 she states 9    She would like some advise on what she should do.  She also needs some advise on insulin asap.   Please advise

## 2017-04-24 NOTE — Telephone Encounter (Signed)
Call addressed on another telephone encounter. Closing this encounter.

## 2017-04-24 NOTE — Telephone Encounter (Signed)
Returned call. No answer. LMTCB.

## 2017-04-24 NOTE — Telephone Encounter (Signed)
Patient waiting for call back-wanted to make sure she did not miss your call re: insulin and how she's doing. Ph# [phone]

## 2017-04-24 NOTE — Telephone Encounter (Signed)
Noted on original telephone encounter. Closing this encounter.

## 2017-04-24 NOTE — Telephone Encounter (Signed)
Spoke to patient. Went over Dr. Timoteo ExposeG's insulin dosage per previous note. Patient verbalized understanding.

## 2017-04-25 ENCOUNTER — Ambulatory Visit (HOSPITAL_COMMUNITY): Admitting: Psychiatry

## 2017-04-30 ENCOUNTER — Telehealth: Payer: Self-pay | Admitting: Emergency Medicine

## 2017-04-30 NOTE — Telephone Encounter (Signed)
Pt called and stated that she previously called with high blood sugars. Corey Harold adjusted her medication but her sugars are still high. She said she also has no energy. She wants to know what she needs to do to get them back under control. Please advise and give her a call back thanks.

## 2017-04-30 NOTE — Telephone Encounter (Signed)
I have on records the following regimen: - Metformin 500 mg x2 at night - Lantus 10  units in a.m. and 20 units at bedtime - NovoLog: 9-10 before each meal  She can increase her NovoLog doses to 12-14 units before a meal.

## 2017-05-01 NOTE — Telephone Encounter (Signed)
Called pt. No answer. No DPR on file.  

## 2017-05-03 ENCOUNTER — Encounter (HOSPITAL_COMMUNITY): Payer: Self-pay | Admitting: Psychiatry

## 2017-05-03 ENCOUNTER — Ambulatory Visit (INDEPENDENT_AMBULATORY_CARE_PROVIDER_SITE_OTHER): Admitting: Psychiatry

## 2017-05-03 VITALS — BP 126/74 | HR 94 | Ht 64.0 in | Wt 166.0 lb

## 2017-05-03 DIAGNOSIS — F341 Dysthymic disorder: Secondary | ICD-10-CM | POA: Diagnosis not present

## 2017-05-03 DIAGNOSIS — Z818 Family history of other mental and behavioral disorders: Secondary | ICD-10-CM | POA: Diagnosis not present

## 2017-05-03 DIAGNOSIS — F3131 Bipolar disorder, current episode depressed, mild: Secondary | ICD-10-CM

## 2017-05-03 DIAGNOSIS — Z736 Limitation of activities due to disability: Secondary | ICD-10-CM

## 2017-05-03 MED ORDER — DESIPRAMINE HCL 25 MG PO TABS: 50.0000 mg | ORAL_TABLET | Freq: Every day | ORAL | 1 refills | Status: DC

## 2017-05-03 MED ORDER — HYDROXYZINE HCL 25 MG PO TABS: ORAL_TABLET | ORAL | 1 refills | Status: DC

## 2017-05-03 MED ORDER — FLUOXETINE HCL 40 MG PO CAPS: 40.0000 mg | ORAL_CAPSULE | Freq: Every day | ORAL | 0 refills | Status: DC

## 2017-05-03 MED ORDER — LORAZEPAM 1 MG PO TABS
ORAL_TABLET | ORAL | 0 refills | Status: DC
Start: 2017-05-03 — End: 2017-11-12

## 2017-05-03 NOTE — Progress Notes (Signed)
Psychiatric Initial Adult Assessment   Patient Identification: [name] MRN:  [id number] Date of Evaluation:  05/03/2017 Referral Source: transfer from Dr Suella Broad Chief Complaint:    Visit Diagnosis:    ICD-10-CM   1. Bipolar affective disorder, currently depressed, mild (HCC) F31.31 DISCONTINUED: FLUoxetine (PROZAC) 40 MG capsule    History of Present Illness  Today the patient is seen alone. She did not bring her husband. The patient continues in therapy on a regular basis in the community. Patient is very neatly and almost demanding. She really denies daily depression. She sleeping and eating well. She's got good energy. She denies worthlessness or problems concentrating. She drinks no alcohol uses no drugs. She enjoys the TV E music and has 3 dogs she likes. Patient seems to be all over the place in terms of her medications. She came to be taking Valium and now says she doesn't want to take it anymore because it oversedated. I think this is great to stop. She does have significant anxiety and is called esophagus multiple times. We started her on Vistaril 25 mg twice a day she's had minimal effects. The patient essentially describes chronic anxiety son criteria for generalized anxiety disorder. He's been treated with multiple psychotropic medications over the years including benzodiazepines.. The patient essentially talks out of both sides of her mouth. At first she says she does want to take any medicines and that she says he wants to take Xanax. Today we refused any Xanax. We did do today shared with her that if she has acute episodes of anxiety such as she had this past weekend she did take 2 Vistaril one time. I do think the patient has been exposed to lots of benzodiazepine while she is resistant to taking Ativan we will go ahead and start her on Ativan 1 mg when necessary every week. She'll receive 30 pills for 3 month.. They will discontinue the Prozac as she says it really doesn't help  her last visit we stopped her Lamictal that she does with that. We start her on desipramine 25 mg at this time she feels something from okay for now. Patient is not suicidal. Depression Symptoms:  depressed mood, (Hypo) Manic Symptoms:   Anxiety Symptoms:  Excessive Worry, Psychotic Symptoms:   PTSD Symptoms:   Past Psychiatric History: 2 psychiatric hospitalizations, under the care of Dr.Cunningham under the care of Dr. Suella Broad  Previous Psychotropic Medications: Yes   Substance Abuse History in the last 12 months:  Yes.    Consequences of Substance Abuse: NA  Past Medical History:  Past Medical History:  Diagnosis Date  . Anxiety   . Bipolar 1 disorder (HCC)   . Breast density 06/25/2012   Right breast density, will get mammogram and [place]  . Depression   . Diabetes mellitus without complication (HCC)   . Duodenitis   . HTN (hypertension)   . Hyperlipidemia   . IBS (irritable bowel syndrome)     Past Surgical History:  Procedure Laterality Date  . ABDOMINAL HYSTERECTOMY    . CHOLECYSTECTOMY    . COLONOSCOPY      Family Psychiatric History:   Family History:  Family History  Problem Relation Age of Onset  . Hypertension Mother   . Bipolar disorder Mother   . Cancer Mother   . Heart disease Brother   . Thyroid disease Sister   . Hypertension Sister   . Bipolar disorder Sister   . Depression Father   . Cancer Father   .  Bipolar disorder Daughter   . Bipolar disorder Maternal Aunt   . Colon cancer Neg Hx   . Colon polyps Neg Hx   . Esophageal cancer Neg Hx   . Kidney disease Neg Hx   . Gallbladder disease Neg Hx   . Diabetes Neg Hx   . Dementia Neg Hx     Social History:   Social History   Socioeconomic History  . Marital status: Married    Spouse name: phillip  . Number of children: 1  . Years of education: 10  . Highest education level: High school graduate  Occupational History  . Occupation: Disabled  Social Needs  . Financial resource strain:  Not hard at all  . Food insecurity:    Worry: Never true    Inability: Never true  . Transportation needs:    Medical: Yes    Non-medical: Yes  Tobacco Use  . Smoking status: Never Smoker  . Smokeless tobacco: Never Used  Substance and Sexual Activity  . Alcohol use: No    Alcohol/week: 0.0 oz  . Drug use: No  . Sexual activity: Not Currently  Lifestyle  . Physical activity:    Days per week: 0 days    Minutes per session: 0 min  . Stress: Not on file  Relationships  . Social connections:    Talks on phone: Never    Gets together: Never    Attends religious service: More than 4 times per year    Active member of club or organization: No    Attends meetings of clubs or organizations: Never    Relationship status: Married  Other Topics Concern  . Not on file  Social History Narrative   Lives at home w/ her husband   Left-handed   Caffeine: 1-2 cups of coffee daily    Additional Social History:   Allergies:   Allergies  Allergen Reactions  . Abilify [Aripiprazole]     Patient is intolerant  . Reglan [Metoclopramide] Other (See Comments)    Chest pains    Metabolic Disorder Labs: Lab Results  Component Value Date   HGBA1C 5.6 11/07/2016   MPG 85 07/16/2013   MPG 91 04/15/2012   No results found for: PROLACTIN Lab Results  Component Value Date   CHOL 187 11/07/2016   TRIG 113.0 11/07/2016   HDL 50.70 11/07/2016   CHOLHDL 4 11/07/2016   VLDL 22.6 11/07/2016   LDLCALC 113 (H) 11/07/2016   LDLCALC 133 (H) 08/24/2015     Current Medications: Current Outpatient Medications  Medication Sig Dispense Refill  . AMBULATORY NON FORMULARY MEDICATION Squatty Potty x 1 1 each 0  . amLODipine (NORVASC) 5 MG tablet Take 5 mg by mouth daily.    . BD PEN NEEDLE NANO U/F 32G X 4 MM MISC USE WITH LANTUS AND NOVOLOG PENS 200 each 4  . BENICAR 40 MG tablet     . desipramine (NORPRAMIN) 25 MG tablet Take 2 tablets (50 mg total) by mouth daily. 180 tablet 1  . FREESTYLE  LITE test strip USE TO CHECK BLOOD SUGAR 6 TIMES PER DAY 600 each 2  . gabapentin (NEURONTIN) 800 MG tablet Take 800 mg by mouth. 1 tablet in the morning and 2 tablets at bedtime.    Marland Kitchen glucagon (GLUCAGON EMERGENCY) 1 MG injection Inject 1 mg into the muscle once as needed. 1 each 12  . hydrOXYzine (ATARAX/VISTARIL) 25 MG tablet 1  Bid   2  q day for anxiety 190 tablet  1  . insulin aspart (NOVOLOG FLEXPEN) 100 UNIT/ML FlexPen Inject up to 12 units 3x a day under skin. 15 pen 11  . Insulin Glargine (LANTUS SOLOSTAR) 100 UNIT/ML Solostar Pen Inject 28 units daily including sliding scale 15 pen 11  . Insulin Syringes, Disposable, U-100 1 ML MISC To use for insulin injection.. 100 each 1  . Lancets (FREESTYLE) lancets Use to test blood sugar 4 to 6 times daily as instructed. 600 each 1  . linaclotide (LINZESS) 290 MCG CAPS capsule Take 1 capsule (290 mcg total) by mouth daily before breakfast. 90 capsule 4  . metFORMIN (GLUCOPHAGE) 500 MG tablet TAKE 1 TABLET TWICE A DAY WITH MEALS 180 tablet 11  . omeprazole (PRILOSEC) 40 MG capsule Take 1 capsule (40 mg total) by mouth 2 (two) times daily. 180 capsule 3  . ondansetron (ZOFRAN ODT) 4 MG disintegrating tablet 1-2 tabs every 4-6 hours as needed four nausea 180 tablet 3  . OSCIMIN 0.125 MG SUBL DISSOLVE 1 TABLET UNDER THE TONGUE EVERY 4 HOURS AS NEEDED 120 each 3  . Plecanatide (TRULANCE) 3 MG TABS Take 3 mg by mouth daily. 90 tablet 3  . LORazepam (ATIVAN) 1 MG tablet 1  q day prn  May repeat 30 tablet 0   No current facility-administered medications for this visit.     Neurologic: Headache: No Seizure: No Paresthesias:No  Musculoskeletal: Strength & Muscle Tone: within normal limits Gait & Station: normal Patient leans: N/A  Psychiatric Specialty Exam: ROS  Blood pressure 126/74, pulse 94, height  (1.626 m), weight 166 lb (75.3 kg).Body mass index is 28.49 kg/m.  General Appearance: Casual  Eye Contact:  Good  Speech:  Clear and  Coherent  Volume:  Normal  Mood:  Anxious  Affect:  Appropriate  Thought Process:  Goal Directed  Orientation:  NA  Thought Content:  WDL  Suicidal Thoughts:  No  Homicidal Thoughts:  No  Memory:  Negative  Judgement:  Good  Insight:  Fair  Psychomotor Activity:  NA and Normal  Concentration:    Recall:  Fair  Fund of Knowledge:Good  Language: Good  Akathisia:  No  Handed:  Right  AIMS (if indicated):    Assets:  Desire for Improvement  ADL's:  Intact  Cognition: WNL  Sleep:      Treatment Plan Summary:  To clarify believe this patient has a persistent dysthymic disorder. I think is reasonable to be one of antidepressant desipramine safe for her. Her Prozac will be discontinued she no longer takes Lamictal for her request she wants to stop Valium will go ahead and do that. We will give her Ativan 1 mg every week when necessary if she needs it. We will continue Vistaril 25 mg twice a day 1 or 2 extra when necessary for anxiety. She'll continue in therapy. This patient will return to see me in 3 months.   Gypsy Balsam, MD 5/3/20199:24 AM

## 2017-05-03 NOTE — Telephone Encounter (Signed)
Called pt. No answer. LMTCB

## 2017-05-06 ENCOUNTER — Other Ambulatory Visit: Payer: Self-pay | Admitting: Internal Medicine

## 2017-05-07 ENCOUNTER — Encounter: Payer: Self-pay | Admitting: Internal Medicine

## 2017-05-07 ENCOUNTER — Ambulatory Visit (INDEPENDENT_AMBULATORY_CARE_PROVIDER_SITE_OTHER): Admitting: Internal Medicine

## 2017-05-07 VITALS — BP 124/72 | HR 104 | Ht 64.0 in | Wt 167.4 lb

## 2017-05-07 DIAGNOSIS — E139 Other specified diabetes mellitus without complications: Secondary | ICD-10-CM | POA: Diagnosis not present

## 2017-05-07 DIAGNOSIS — G63 Polyneuropathy in diseases classified elsewhere: Secondary | ICD-10-CM | POA: Diagnosis not present

## 2017-05-07 MED ORDER — INSULIN GLARGINE 100 UNIT/ML SOLOSTAR PEN: PEN_INJECTOR | SUBCUTANEOUS | 11 refills | Status: DC

## 2017-05-07 MED ORDER — INSULIN ASPART 100 UNIT/ML FLEXPEN: PEN_INJECTOR | SUBCUTANEOUS | 3 refills | Status: DC

## 2017-05-07 NOTE — Telephone Encounter (Signed)
Sent letter

## 2017-05-07 NOTE — Patient Instructions (Signed)
Please continue: - Metformin 500 mg x2 at night  Please increase: - Lantus 10 units in a.m. and 25 units at bedtime  - NovoLog: 10-12 before each meal  Please continue: - Novolog Sliding scale: 150-200: + 1 unit 201-250: + 2 units 251-300: + 3 units 301-350: + 4 units >350: + 5 units  Please return in 3 months with your sugar log.

## 2017-05-07 NOTE — Progress Notes (Signed)
Patient ID: [name], female   DOB: [birth year], 62 y.o.   MRN: [id number]  HPI: [name] is a 62 y.o.-year-old female, initially referred by her PCP, Dr. Assunta Found (PA: Lenise Herald), returning for f/u for LADA, dx 2005, insulin-dependent since ~2006, controlled, with complications (PN, gastroparesis?). Last visit 3 mo ago.   She had a severe panic attack ~10 days ago >> she recovered with difficulty;  her sugars were quite high around that time.   Reviewed hx: She was on an Omnipod insulin pump on 08/01/2015. She had severe anxiety and depression and got so overwhelmed with the insulin pump that we had to take her off the pump.   We switched her to a basal-bolus insulin regimen, but she was unable to calculate the insulin doses based on insulin to carb ratio, so now she is using fixed rapid acting insulin doses.  Previous fructosamine levels: 02/07/2017: HbA1c calculated from the fructosamine was higher, at 7.7%. 11/07/2016: HbA1c calc. from the fructosamine is a little lower, at 7.25% 08/07/2016: HbA1c calculated from the fructosamine is 7.5% 05/31/2016: HbA1c calculated from fructosamine is better, at 7.25% 12/15/2015: HbA1c calculated from the fructosamine is higher, 8.4%. 08/24/2015:  HbA1c calculated from fructosamine is 7.4%. 05/24/2015: HbA1c calculated from fructosamine is 6.9%. ... Lab Results  Component Value Date   HGBA1C 5.6 11/07/2016   HGBA1C 4.6 07/16/2013   HGBA1C sent to [facility] 07/16/2013   She is on: - Lantus 28 units at bedtime >> 10 units in am and 20 units in pm  - NovoLog:   6-7 units before b'fast >> 8-10 7-9 units before lunch and dinner >> 8-10 - Novolog Sliding scale: 150-200: + 1 unit 201-250: + 2 units 251-300: + 3 units 301-350: + 4 units >350: + 5 units - Metformin 500 mg x2 at night   Pt checks her sugars 4x a day - reviewed log: - am: 33, 87-190, 218, 235 >> 65, 95-229, 275 >> 66, 128-229, 254 - 2h after b'fast: 215 >> 99, 345 >>  98 >> 342 x1 >> n/c - before lunch:45, 55, 75-187 >> 89-260, 353 >> 66, 100-255, 331 - 2h after lunch:  46, 58 >> 83 >> 32 x1 >> n/c - before dinner: 50, 60-195, 201, 226, 266 >> 87-217, 293, 315 >> 55, 102-222, 324 - 2h after dinner: 211-365 >> 162 >> n/c - bedtime:  82-181, 271, 300 >> 67, 89-204, 246 >> 62, 80, 103-226, 240 - at night: 116 >> 82, 250 >> 90 >> n/c Lowest sugar was 33 >> 65 >> 62; she has hypoglycemia awareness  in the 50s Highest sugar was 300 >> 342 >> 331  Pt's meals are: - Breakfast: toast, bowl of cereal + 2% milk (2x a week) or oatmeal, occas. eggs - Lunch: soup, sometimes sandwich, vegetables - Dinner: chicken/pork/beef + vegetable, no starch - Snacks: sugar free sweets  - No CKD, last BUN/creatinine:  Lab Results  Component Value Date   BUN 14 11/07/2016   CREATININE 0.58 11/07/2016   No MAU: Lab Results  Component Value Date   MICRALBCREAT 5.4 11/07/2016    -+ HL; lipids: Lab Results  Component Value Date   CHOL 187 11/07/2016   HDL 50.70 11/07/2016   LDLCALC 113 (H) 11/07/2016   TRIG 113.0 11/07/2016   CHOLHDL 4 11/07/2016  Not on a statin.  - last eye exam was in [place] in 09/2016: No DR.  + Small cataract - [facility]: Dr. Janne Lab. - +  numbness and tingling in her feet.  On Neurontin She is seeing a podiatrist [facility]). She had 2 toenails removed due to fungal inf.  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 She also has a dx of Parkinson ds.  And bipolar disease. Also: GERD, HTN, Fibromyalgia >> on Neurontin  Last TSH - normal: Lab Results  Component Value Date   TSH 0.57 11/07/2016   ROS: Constitutional: + Fatigue, + weight gain, + heat intolerance, + hot flashes Eyes: no blurry vision, no xerophthalmia ENT: + sore throat, no nodules palpated in throat, + dysphagia, no odynophagia, no hoarseness Cardiovascular: no CP/+ SOB/no palpitations/no leg swelling Respiratory: no cough/+ SOB/no  wheezing Gastrointestinal: + N/no V/no D/+ C (IBS)/+ acid reflux Musculoskeletal: no muscle aches/no joint aches Skin: no rashes, + hair loss Neurological: no tremors/no numbness/no tingling/no dizziness, + headaches  I reviewed pt's medications, allergies, PMH, social hx, family hx, and changes were documented in the history of present illness. Otherwise, unchanged from my initial visit note.  She started  Trulance for IBS.  PE: BP 124/72   Pulse (!) 104   Ht  (1.626 m)   Wt 167 lb 6.4 oz (75.9 kg)   SpO2 99%   BMI 28.73 kg/m    Wt Readings from Last 3 Encounters:  05/07/17 167 lb 6.4 oz (75.9 kg)  03/13/17 165 lb (74.8 kg)  03/11/17 169 lb 12.8 oz (77 kg)   Constitutional: overweight, in NAD Eyes: PERRLA, EOMI, no exophthalmos ENT: moist mucous membranes, no thyromegaly, no cervical lymphadenopathy Cardiovascular: tachycardia, RR, No MRG Respiratory: CTA B Gastrointestinal: abdomen soft, NT, ND, BS+ Musculoskeletal: no deformities, strength intact in all 4 Skin: moist, warm, no rashes Neurological: no tremor with outstretched hands, DTR normal in all 4  ASSESSMENT: 1. LADA, insulin-dependent, uncontrolled, with complications - peripheral neuropathy - gastroparesis?  Component     Latest Ref Rng 07/16/2013  C-Peptide     0.80 - 3.90 ng/mL 1.29  Glucose     70 - 99 mg/dL 161 (H)  Glutamic Acid Decarb Ab     <=1.0 U/mL 11.3 (H)  Pancreatic Islet Cell Antibody     <5 JDF Units 5 (A)  + anti-pancreatic antibodies >> LADA rather than type 2 DM. She still has a positive C peptide >> still has insulin secretion. For this reason, we continued the Metformin.  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  Complex patient, with LADA, complicated by severe anxiety and depression.  She is currently on a basal-bolus insulin regimen and metformin.  Previously on an insulin pump, but she came off due to increased anxiety.  Sugars were worse at last visit and she has kept in touch with me since  then we adjusted her insulin regimen in the last 3 months.  We need to keep her insulin regimen as simple as possible when she had to calculate insulin doses in the past.  She continues to keep a good log. - As discussed at last visit whenever at this visit, sugars continue to stay variable and, fortunately, she did not make notes in her log, she saw an abnormal value.  We discussed again about starting to do so.   - Reviewing her log, she has occasional lows, which she cannot explain.  Overall, the vast majority of the sugars, however, are higher than target.  We will increase evening Lantus and I also advised her to increase NovoLog before meals.  I advised her to  continue to use the sliding scale. - I suggested to:  Patient Instructions  Please continue: - Metformin 500 mg x2 at night  Please increase: - Lantus 10 units in a.m. and 25 units at bedtime  - NovoLog: 10-12 before each meal  Please continue: - Novolog Sliding scale: 150-200: + 1 unit 201-250: + 2 units 251-300: + 3 units 301-350: + 4 units >350: + 5 units  Please return in 3 months with your sugar log.    - today will check a fructosamine - continue checking sugars at different times of the day - check 3-4x a day, rotating checks - advised for yearly eye exams >> she is UTD - Return to clinic in 3 mo with sugar log    2. PN  - 2/2 DM - Stable, on Neurontin.  No side effects.  Office Visit on 05/07/2017  Component Date Value Ref Range Status  . Fructosamine 05/07/2017 362* 190 - 270 umol/L Final   HbA1c calculated from the fructosamine is 7.7%,  similar to before  Carlus Pavlov, MD PhD [facility]

## 2017-05-09 ENCOUNTER — Encounter: Payer: Self-pay | Admitting: Internal Medicine

## 2017-05-09 LAB — FRUCTOSAMINE: Fructosamine: 362 umol/L — ABNORMAL HIGH (ref 190–270)

## 2017-05-22 ENCOUNTER — Telehealth: Payer: Self-pay | Admitting: Internal Medicine

## 2017-05-22 NOTE — Telephone Encounter (Signed)
Spoke to patient. Advised unable to contact her per previous telephone messages when she called late April. Advised sent letter. Pt states she never received letter. Confirmed address on chart correct. Confirmed phone number on chart is the best contact #. Confirmed pt is taking regimen on record per telephone note on 04/30. Gave new NovoLog dose instructions per Dr. Elvera Lennox. Pt verbalized understanding. Advised would notify Dr. Reece Agar of new readings and c/b w/ any med changes if differ from 04/30/17 note.  Pt agreed.

## 2017-05-22 NOTE — Telephone Encounter (Signed)
Patient stated that her B/s has been over 200;s and a couple of 300's being going on about 2 weeks. Please advise

## 2017-05-24 ENCOUNTER — Telehealth: Payer: Self-pay | Admitting: Internal Medicine

## 2017-05-24 NOTE — Telephone Encounter (Signed)
Please continue: - Metformin 500 mg x2 at night - NovoLog: 10-12 before each meal  Please increase: - Lantus 10 >> 20 units in a.m. and 25 units at bedtime

## 2017-05-24 NOTE — Telephone Encounter (Signed)
Patient stated that [name] changed the dosage on her insulin and her b/ is still running high in the 300's  Please advise

## 2017-05-24 NOTE — Telephone Encounter (Signed)
Called pt. No answer.  Left vmail per DPR.  

## 2017-05-28 ENCOUNTER — Ambulatory Visit (INDEPENDENT_AMBULATORY_CARE_PROVIDER_SITE_OTHER): Admitting: Gastroenterology

## 2017-05-28 ENCOUNTER — Ambulatory Visit (HOSPITAL_COMMUNITY): Admitting: Psychiatry

## 2017-05-28 ENCOUNTER — Encounter: Payer: Self-pay | Admitting: Gastroenterology

## 2017-05-28 ENCOUNTER — Telehealth: Payer: Self-pay | Admitting: Internal Medicine

## 2017-05-28 VITALS — BP 140/82 | HR 76 | Ht 64.0 in | Wt 174.0 lb

## 2017-05-28 DIAGNOSIS — K5904 Chronic idiopathic constipation: Secondary | ICD-10-CM | POA: Diagnosis not present

## 2017-05-28 DIAGNOSIS — K581 Irritable bowel syndrome with constipation: Secondary | ICD-10-CM | POA: Diagnosis not present

## 2017-05-28 MED ORDER — PLECANATIDE 3 MG PO TABS: 3.0000 mg | ORAL_TABLET | Freq: Every day | ORAL | 3 refills | Status: DC

## 2017-05-28 NOTE — Patient Instructions (Signed)
We will send refills of Trulance to your pharmacy  Your Cologard recall will be in 05/2019  Follow up as needed  If you are age 64 or older, your body mass index should be between 23-30. Your Body mass index is 29.87 kg/m. If this is out of the aforementioned range listed, please consider follow up with your Primary Care Provider.  If you are age 65 or younger, your body mass index should be between 19-25. Your Body mass index is 29.87 kg/m. If this is out of the aformentioned range listed, please consider follow up with your Primary Care Provider.

## 2017-05-28 NOTE — Telephone Encounter (Signed)
Signed        Pt called back- she states the elevated CBGs are her morning readings. She is currently taking Novolog 12-14  before lunch and supper,  lantus 10 in AM and 28 in PM and Metformin 500 mg 2 qhs. She denies any new sxs, she just wants to know if she needs to adjust meds again.        Please advise.

## 2017-05-28 NOTE — Telephone Encounter (Signed)
Patient needs to speak to someone about her blood sugar readings. She states that her blood sugar has been over 200 for the last 25 days Please advise

## 2017-05-28 NOTE — Telephone Encounter (Signed)
Left mess for patient to call back.  

## 2017-05-28 NOTE — Progress Notes (Signed)
[name]    [id number]    [birth year]  Primary Care Physician:Golding, Jonny Ruiz, MD  Referring Physician: Assunta Found, MD [address] [place], [place] [address]  Chief complaint:  Constipation  HPI: 62 yr F with history of chronic irritable bowel syndrome predominant constipation here for follow-up visit. On trulance  daily with a bowel movement every other day.  She continues to have abdominal bloating and intermittent abdominal cramping.  Denies any abdominal pain, vomiting, melena or blood per rectum.  She is trying to lose weight with diet.  She continues to have significant symptoms from depression and anxiety and is struggling to cope.  She is following with behavioral health provider and is getting counseling.    Outpatient Encounter Medications as of 05/28/2017  Medication Sig  . AMBULATORY NON FORMULARY MEDICATION Squatty Potty x 1  . amLODipine (NORVASC) 5 MG tablet Take 5 mg by mouth daily.  . BD PEN NEEDLE NANO U/F 32G X 4 MM MISC USE WITH LANTUS AND NOVOLOG PENS  . BENICAR 40 MG tablet   . desipramine (NORPRAMIN) 25 MG tablet Take 2 tablets (50 mg total) by mouth daily.  Marland Kitchen FREESTYLE LITE test strip USE TO CHECK BLOOD SUGAR 6 TIMES PER DAY  . gabapentin (NEURONTIN) 800 MG tablet Take 800 mg by mouth. 1 tablet in the morning and 2 tablets at bedtime.  Marland Kitchen glucagon (GLUCAGON EMERGENCY) 1 MG injection Inject 1 mg into the muscle once as needed.  . hydrOXYzine (ATARAX/VISTARIL) 25 MG tablet 1  Bid   2  q day for anxiety  . insulin aspart (NOVOLOG FLEXPEN) 100 UNIT/ML FlexPen INJECT UP TO 40 UNITS DAILY, ALSO INCLUDING SLIDING SCALE  . Insulin Glargine (LANTUS SOLOSTAR) 100 UNIT/ML Solostar Pen Inject 10 units in am and 25 units at bedtime, under skin  . Insulin Syringes, Disposable, U-100 1 ML MISC To use for insulin injection..  . Lancets (FREESTYLE) lancets Use to test blood sugar 4 to 6 times daily as instructed.  Marland Kitchen LORazepam (ATIVAN) 1 MG tablet 1  q  day prn  May repeat  . metFORMIN (GLUCOPHAGE) 500 MG tablet TAKE 1 TABLET TWICE A DAY WITH MEALS  . omeprazole (PRILOSEC) 40 MG capsule Take 1 capsule (40 mg total) by mouth 2 (two) times daily.  . ondansetron (ZOFRAN ODT) 4 MG disintegrating tablet 1-2 tabs every 4-6 hours as needed four nausea  . OSCIMIN 0.125 MG SUBL DISSOLVE 1 TABLET UNDER THE TONGUE EVERY 4 HOURS AS NEEDED  . Plecanatide (TRULANCE) 3 MG TABS Take 3 mg by mouth daily.   No facility-administered encounter medications on file as of 05/28/2017.     Allergies as of 05/28/2017 - Review Complete 05/07/2017  Allergen Reaction Noted  . Abilify [aripiprazole]  08/10/2016  . Reglan [metoclopramide] Other (See Comments) 03/23/2013    Past Medical History:  Diagnosis Date  . Anxiety   . Bipolar 1 disorder (HCC)   . Breast density 06/25/2012   Right breast density, will get mammogram and [place]  . Depression   . Diabetes mellitus without complication (HCC)   . Duodenitis   . HTN (hypertension)   . Hyperlipidemia   . IBS (irritable bowel syndrome)     Past Surgical History:  Procedure Laterality Date  . ABDOMINAL HYSTERECTOMY    . CHOLECYSTECTOMY    . COLONOSCOPY      Family History  Problem Relation Age of Onset  . Hypertension Mother   .  Bipolar disorder Mother   . Cancer Mother   . Heart disease Brother   . Thyroid disease Sister   . Hypertension Sister   . Bipolar disorder Sister   . Depression Father   . Cancer Father   . Bipolar disorder Daughter   . Bipolar disorder Maternal Aunt   . Colon cancer Neg Hx   . Colon polyps Neg Hx   . Esophageal cancer Neg Hx   . Kidney disease Neg Hx   . Gallbladder disease Neg Hx   . Diabetes Neg Hx   . Dementia Neg Hx     Social History   Socioeconomic History  . Marital status: Married    Spouse name: phillip  . Number of children: 1  . Years of education: 56  . Highest education level: High school graduate  Occupational History  . Occupation: Disabled    Social Needs  . Financial resource strain: Not hard at all  . Food insecurity:    Worry: Never true    Inability: Never true  . Transportation needs:    Medical: Yes    Non-medical: Yes  Tobacco Use  . Smoking status: Never Smoker  . Smokeless tobacco: Never Used  Substance and Sexual Activity  . Alcohol use: No    Alcohol/week: 0.0 oz  . Drug use: No  . Sexual activity: Not Currently  Lifestyle  . Physical activity:    Days per week: 0 days    Minutes per session: 0 min  . Stress: Not on file  Relationships  . Social connections:    Talks on phone: Never    Gets together: Never    Attends religious service: More than 4 times per year    Active member of club or organization: No    Attends meetings of clubs or organizations: Never    Relationship status: Married  . Intimate partner violence:    Fear of current or ex partner: No    Emotionally abused: No    Physically abused: No    Forced sexual activity: No  Other Topics Concern  . Not on file  Social History Narrative   Lives at home w/ her husband   Left-handed   Caffeine: 1-2 cups of coffee daily      Review of systems: Review of Systems  Constitutional: Negative for fever and chills.  Lack of energy HENT: Positive for sinus problem Eyes: Negative for blurred vision.  Respiratory: Negative for cough, shortness of breath and wheezing.   Cardiovascular: Negative for chest pain and palpitations.  Gastrointestinal: as per HPI Genitourinary: Negative for dysuria, urgency, frequency and hematuria.  Musculoskeletal: Positive for myalgias, back pain and joint pain.  Skin: Negative for itching and rash.  Neurological: Negative for dizziness, tremors, focal weakness, seizures and loss of consciousness.  Endo/Heme/Allergies: Positive for seasonal allergies.  Psychiatric/Behavioral: Negative for suicidal ideas and hallucinations.  Positive for depression, anxiety, mood swings, irritability and insomnia All other  systems reviewed and are negative.   Physical Exam: Vitals:   05/28/17 0852  BP: 140/82  Pulse: 76   Body mass index is 29.87 kg/m. Gen:      No acute distress HEENT:  EOMI, sclera anicteric Neck:     No masses; no thyromegaly Lungs:    Clear to auscultation bilaterally; normal respiratory effort CV:         Regular rate and rhythm; no murmurs Abd:      + bowel sounds; soft, non-tender; no palpable masses, no distension  Ext:    No edema; adequate peripheral perfusion Skin:      Warm and dry; no rash Neuro: alert and oriented x 3 Psych: normal mood and affect  Data Reviewed:  Reviewed labs, radiology imaging, old records and pertinent past GI work up   Assessment and Plan/Recommendations: 62 year old female with history of chronic depression, anxiety disorder, fibromyalgia, diabetes on insulin, irritable bowel syndrome with predominant constipation  Continue Trulance 3 mg daily Increase dietary fiber and fluid intake Advised patient to increase physical activity  Cologaurd May 2018 negative Due for colorectal cancer screening in May 2021  25 minutes was spent face-to-face with the patient. Greater than 50% of the time used for counseling as well as treatment plan and follow-up. She had multiple questions which were answered to her satisfaction  K. Scherry Ran , MD [phone]    CC: Assunta Found, MD

## 2017-05-28 NOTE — Telephone Encounter (Signed)
Please see in her chart: We called her on Friday, but Cassandra could not get through to her and left her a message with the following changes:  Me  to [name], CMA     05/24/17 5:09 PM  Note    Please continue: - Metformin 500 mg x2 at night - NovoLog: 10-12 before each meal  Please increase: - Lantus 10 >> 20 units in a.m. and 25 units at bedtime

## 2017-05-29 NOTE — Telephone Encounter (Signed)
Pt informed of below.  

## 2017-05-31 ENCOUNTER — Encounter: Payer: Self-pay | Admitting: Gastroenterology

## 2017-06-24 ENCOUNTER — Other Ambulatory Visit: Payer: Self-pay

## 2017-06-24 MED ORDER — HYDROXYZINE HCL 25 MG PO TABS: ORAL_TABLET | ORAL | 1 refills | Status: DC

## 2017-06-25 ENCOUNTER — Other Ambulatory Visit (HOSPITAL_COMMUNITY): Payer: Self-pay

## 2017-06-26 ENCOUNTER — Telehealth: Payer: Self-pay | Admitting: Internal Medicine

## 2017-06-26 ENCOUNTER — Telehealth: Payer: Self-pay

## 2017-06-26 NOTE — Telephone Encounter (Signed)
[name] needs to reduce her fast acting insulin down to 8 units only Also to stop her morning Lantus and increase evening Lantus up to 30 units.  Needs to see Dr. Elvera LennoxGherghe as soon as possible but also recommend that [name] see Bonita QuinLinda for diabetes education

## 2017-06-26 NOTE — Telephone Encounter (Signed)
error 

## 2017-06-26 NOTE — Telephone Encounter (Signed)
Patient called today upset because her blood sugar readings have been all over the place and she is worried I asked for the last few days of readings and they are: Sunday- 232 an fasting                259 before lunch                32 before dinner                93 bed time Monday-  272 am fasting                 37 before lunch                 270 before dinner                 46 bed time Tuesday- 227 am fasting                 160 before lunch                 138 before dinner                  38 2 hours after dinner                  22 0 bed time Wednesday- 160 am fasting  Patient reported taking 12-14 units before each meal and lantus 10 units in the am and 28 in the evening- please advise in the absence of Dr. Elvera LennoxGherghe

## 2017-06-26 NOTE — Telephone Encounter (Signed)
Pt called and given MD message. Pt verbalized understanding and stated that she would call back and schedule a visit. Pt also stated that she has tried to schedule a visit with the diabetic educator but her insurance will not pay for the visit.

## 2017-06-28 ENCOUNTER — Ambulatory Visit (INDEPENDENT_AMBULATORY_CARE_PROVIDER_SITE_OTHER): Admitting: Internal Medicine

## 2017-06-28 ENCOUNTER — Encounter: Payer: Self-pay | Admitting: Internal Medicine

## 2017-06-28 VITALS — BP 140/82 | HR 94 | Ht 64.0 in | Wt 173.8 lb

## 2017-06-28 DIAGNOSIS — G63 Polyneuropathy in diseases classified elsewhere: Secondary | ICD-10-CM | POA: Diagnosis not present

## 2017-06-28 DIAGNOSIS — E139 Other specified diabetes mellitus without complications: Secondary | ICD-10-CM

## 2017-06-28 MED ORDER — INSULIN GLARGINE 100 UNIT/ML SOLOSTAR PEN: PEN_INJECTOR | SUBCUTANEOUS | 11 refills | Status: DC

## 2017-06-28 NOTE — Patient Instructions (Signed)
Please continue: - Metformin 500 mg x2 at night - Lantus 30 units at night  Please change: - NovoLog: 8-10 units  before each meal  Please add: - Novolog Sliding scale: 150-200: + 1 unit 201-250: + 2 units 251-300: + 3 units >301: + 4 units  Try to take Novolog at the start of the meal.  Please come back in September, in 3 months.

## 2017-06-28 NOTE — Progress Notes (Signed)
Patient ID: Sabrina ComptonBarbara L [name], female   DOB: 04/06/1955, 62 y.o.   MRN: [id number]  HPI: [name] is a 62 y.o.-year-old female, initially referred by her PCP, Dr. Assunta FoundJohn Golding (PA: Lenise HeraldBenjamin Mann), returning for f/u for LADA, dx 2005, insulin-dependent since ~2006, controlled, with complications (PN, gastroparesis?). Last visit 2.5 mo ago.   She has more anxiety and more panic attacks.  Sugars worsened so we had to increase her insulin doses, however, she called us 2 days ago with low blood sugars in the 30s and 40s and her doses of insulin were changed by my colleague (I was out of office).  Reviewed history: She was on an Omnipod insulin pump on 08/01/2015. She had severe anxiety and depression and got so overwhelmed with the insulin pump that we had to take her off the pump.   We switched her to a basal-bolus insulin regimen, but she was unable to calculate the insulin doses based on insulin to carb ratio, so now she is using fixed rapid acting insulin doses.  Previous fructosamine levels: 05/07/2017: HbA1c calculated from the fructosamine was 7.7% 02/07/2017: HbA1c calculated from the fructosamine was higher, at 7.7%. 11/07/2016: HbA1c calc. from the fructosamine is a little lower, at 7.25% 08/07/2016: HbA1c calculated from the fructosamine is 7.5% 05/31/2016: HbA1c calculated from fructosamine is better, at 7.25% 12/15/2015: HbA1c calculated from the fructosamine is higher, 8.4%. 08/24/2015:  HbA1c calculated from fructosamine is 7.4%. 05/24/2015: HbA1c calculated from fructosamine is 6.9%. ... Lab Results  Component Value Date   HGBA1C 5.6 11/07/2016   HGBA1C 4.6 07/16/2013   [facility]16/[facility]   She is on: - Metformin 500 mg x2 at night - NovoLog: 12-14 >> 8  before each meal(changed 2 days ago  by my colleague) - Lantus 10 units in a.m. and 28 units at bedtime >> 30 units at night (changed 2 days ago by my colleague)    Pt checks her sugars 4x a day -reviewed her  log: - am: 65, 95-229, 275 >> 66, 128-229, 254 >> 108-272 - 2h after b'fast: 98 >> 342 x1 >> n/c - before lunch:89-260, 353 >> 66, 100-255, 331 >> 47, 71-259, 331 - 2h after lunch:  46, 58 >> 83 >> 32 x1 >> n/c >> 128 - before dinner:87-217, 293, 315 >> 55, 102-222, 324 >> 32, 59, 82-273 - 2h after dinner: 211-365 >> 162 >> n/c - bedtime:67, 89-204, 246 >> 62, 80, 103-226, 240 >> 38, 46, 56-220, 245 - at night: 116 >> 82, 250 >> 90 >> n/c Lowest sugar was 33 >> 65 >> 62 >> 38; she has hypoglycemia awareness in the 50s. Highest sugar was 300 >> 342 >> 331 >> 331  Pt's meals are: - Breakfast: toast, bowl of cereal + 2% milk (2x a week) or oatmeal, occas. eggs - Lunch: soup, sometimes sandwich, vegetables - Dinner: chicken/pork/beef + vegetable, no starch - Snacks: sugar free sweets  - No CKD, last BUN/creatinine:  Lab Results  Component Value Date   BUN 14 11/07/2016   CREATININE 0.58 11/07/2016   No microalbuminuria: Lab Results  Component Value Date   MICRALBCREAT 5.4 11/07/2016    -+ HL; lipids: Lab Results  Component Value Date   CHOL 187 11/07/2016   HDL 50.70 11/07/2016   LDLCALC 113 (H) 11/07/2016   TRIG 113.0 11/07/2016   CHOLHDL 4 11/07/2016  Not on a statin.  - last eye exam was in [place] in 09/2016: No DR + small cataract -  [facility]: Dr. Janne Lab. - + numbness and tingling in her feet.  On Neurontin. She is seeing a podiatrist [facility]).  2 toenails removed due to fungal infection  Pt has a h/o admission for Li toxicity 04/2012. She also has a dx of Parkinson ds.  And bipolar disease. Also: GERD, HTN, Fibromyalgia >> on Neurontin.  Last TSH -normal: Lab Results  Component Value Date   TSH 0.57 11/07/2016   ROS: Constitutional: + weight gain/no weight loss, + fatigue, + subjective hyperthermia, no subjective hypothermia Eyes: no blurry vision, no xerophthalmia ENT: no sore throat, no nodules palpated in throat, no dysphagia, no  odynophagia,+ hoarseness Cardiovascular: no CP/no SOB/no palpitations/no leg swelling Respiratory: no cough/no SOB/no wheezing Gastrointestinal: + N/no V/no D/+ C/no acid reflux Musculoskeletal: no muscle aches/+ joint aches Skin: no rashes, no hair loss Neurological: no tremors/no numbness/no tingling/no dizziness, + HA  I reviewed pt's medications, allergies, PMH, social hx, family hx, and changes were documented in the history of present illness. Otherwise, unchanged from my initial visit note.  Since last visit, she stopped Prozac.  Past Medical History:  Diagnosis Date  . Anxiety   . Bipolar 1 disorder (HCC)   . Breast density 06/25/2012   Right breast density, will get mammogram and [place]  . Depression   . Diabetes mellitus without complication (HCC)   . Duodenitis   . HTN (hypertension)   . Hyperlipidemia   . IBS (irritable bowel syndrome)    Past Surgical History:  Procedure Laterality Date  . ABDOMINAL HYSTERECTOMY    . CHOLECYSTECTOMY    . COLONOSCOPY     Social History   Socioeconomic History  . Marital status: Married    Spouse name: phillip  . Number of children: 1  . Years of education: 9  . Highest education level: High school graduate  Occupational History  . Occupation: Disabled  Social Needs  . Financial resource strain: Not hard at all  . Food insecurity:    Worry: Never true    Inability: Never true  . Transportation needs:    Medical: Yes    Non-medical: Yes  Tobacco Use  . Smoking status: Never Smoker  . Smokeless tobacco: Never Used  Substance and Sexual Activity  . Alcohol use: No    Alcohol/week: 0.0 oz  . Drug use: No  . Sexual activity: Not Currently  Lifestyle  . Physical activity:    Days per week: 0 days    Minutes per session: 0 min  . Stress: Not on file  Relationships  . Social connections:    Talks on phone: Never    Gets together: Never    Attends religious service: More than 4 times per year    Active member of club or  organization: No    Attends meetings of clubs or organizations: Never    Relationship status: Married  . Intimate partner violence:    Fear of current or ex partner: No    Emotionally abused: No    Physically abused: No    Forced sexual activity: No  Other Topics Concern  . Not on file  Social History Narrative   Lives at home w/ her husband   Left-handed   Caffeine: 1-2 cups of coffee daily   Current Outpatient Medications on File Prior to Visit  Medication Sig Dispense Refill  . AMBULATORY NON FORMULARY MEDICATION Squatty Potty x 1 1 each 0  . amLODipine (NORVASC) 5 MG tablet Take 5 mg  by mouth daily.    . BD PEN NEEDLE NANO U/F 32G X 4 MM MISC USE WITH LANTUS AND NOVOLOG PENS 200 each 4  . BENICAR 40 MG tablet     . desipramine (NORPRAMIN) 25 MG tablet Take 2 tablets (50 mg total) by mouth daily. 180 tablet 1  . FREESTYLE LITE test strip USE TO CHECK BLOOD SUGAR 6 TIMES PER DAY 600 each 2  . gabapentin (NEURONTIN) 800 MG tablet Take 800 mg by mouth. 1 tablet in the morning and 2 tablets at bedtime.    Marland Kitchen glucagon (GLUCAGON EMERGENCY) 1 MG injection Inject 1 mg into the muscle once as needed. 1 each 12  . hydrOXYzine (ATARAX/VISTARIL) 25 MG tablet 1 tab Bid for anxiety 180 tablet 1  . insulin aspart (NOVOLOG FLEXPEN) 100 UNIT/ML FlexPen INJECT UP TO 40 UNITS DAILY, ALSO INCLUDING SLIDING SCALE 45 mL 3  . Insulin Glargine (LANTUS SOLOSTAR) 100 UNIT/ML Solostar Pen Inject 10 units in am and 25 units at bedtime, under skin 15 pen 11  . Insulin Syringes, Disposable, U-100 1 ML MISC To use for insulin injection.. 100 each 1  . Lancets (FREESTYLE) lancets Use to test blood sugar 4 to 6 times daily as instructed. 600 each 1  . LORazepam (ATIVAN) 1 MG tablet 1  q day prn  May repeat 30 tablet 0  . metFORMIN (GLUCOPHAGE) 500 MG tablet TAKE 1 TABLET TWICE A DAY WITH MEALS 180 tablet 11  . omeprazole (PRILOSEC) 40 MG capsule Take 1 capsule (40 mg total) by mouth 2 (two) times daily. 180 capsule  3  . ondansetron (ZOFRAN ODT) 4 MG disintegrating tablet 1-2 tabs every 4-6 hours as needed four nausea 180 tablet 3  . OSCIMIN 0.125 MG SUBL DISSOLVE 1 TABLET UNDER THE TONGUE EVERY 4 HOURS AS NEEDED 120 each 3  . Plecanatide (TRULANCE) 3 MG TABS Take 3 mg by mouth daily. 90 tablet 3   No current facility-administered medications on file prior to visit.    Allergies  Allergen Reactions  . Abilify [Aripiprazole]     Patient is intolerant  . Reglan [Metoclopramide] Other (See Comments)    Chest pains   Family History  Problem Relation Age of Onset  . Hypertension Mother   . Bipolar disorder Mother   . Cancer Mother   . Heart disease Brother   . Thyroid disease Sister   . Hypertension Sister   . Bipolar disorder Sister   . Depression Father   . Cancer Father   . Bipolar disorder Daughter   . Bipolar disorder Maternal Aunt   . Colon cancer Neg Hx   . Colon polyps Neg Hx   . Esophageal cancer Neg Hx   . Kidney disease Neg Hx   . Gallbladder disease Neg Hx   . Diabetes Neg Hx   . Dementia Neg Hx     PE: BP 140/82   Pulse 94   Ht 5\' 4"  (1.626 m)   Wt 173 lb 12.8 oz (78.8 kg)   SpO2 98%   BMI 29.83 kg/m    Wt Readings from Last 3 Encounters:  06/28/17 173 lb 12.8 oz (78.8 kg)  05/28/17 174 lb (78.9 kg)  05/07/17 167 lb 6.4 oz (75.9 kg)   Constitutional: overweight, in NAD Eyes: PERRLA, EOMI, no exophthalmos ENT: moist mucous membranes, no thyromegaly, no cervical lymphadenopathy Cardiovascular: Tachycardia, RR, No MRG Respiratory: CTA B Gastrointestinal: abdomen soft, NT, ND, BS+ Musculoskeletal: no deformities, strength intact in all 4 Skin:  moist, warm, no rashes Neurological: no tremor with outstretched hands, DTR normal in all 4  ASSESSMENT: 1. LADA, insulin-dependent, uncontrolled, with complications - peripheral neuropathy - gastroparesis?  Component     Latest Ref Rng 07/16/2013  C-Peptide     0.80 - 3.90 ng/mL 1.29  Glucose     70 - 99 mg/dL 161  (H)  Glutamic Acid Decarb Ab     <=1.0 U/mL 11.3 (H)  Pancreatic Islet Cell Antibody     <5 JDF Units 5 (A)  + anti-pancreatic antibodies >> LADA rather than type 2 DM. She still has a positive C peptide >> still has insulin secretion. For this reason, we continued the Metformin.  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  Complex patient, with LADA, complicated by severe anxiety and depression.  She is on a basal-bolus insulin regimen and also metformin.  She was previously on insulin pump, but she came off due to increased anxiety.  Her sugars started to worsen a few months ago and we kept increasing her insulin doses until 2 days ago when she called with lower blood sugars, even in the 30s or 40s and her m both of her insulins were decreased at that time.   - I do not have enough data to evaluate the current regimen, but based on her sugar log, she did not have any more low blood sugars and also not significant hyperglycemia.  Therefore, we discussed about continuing with the same dose of Lantus, once a day, at night, but I advised her to use a higher dose of NovoLog if she eats a larger meal or if she eats out.  We will also add back her NovoLog sliding scale, with a cap at 14 units of insulin before meals. -She continues to do a great job with her sugar log, checking her sugars at least 4 times a day. -She is also doing a good job with her diet, limiting concentrated sweets and eating a lot of veggies and snacking on fruit. -She does have a gastroparesis and I did advise her to move the NovoLog at the start of the meal rather than 20 minutes before, as she is taking it now.  I advised her that we may even need to move the NovoLog after the meal to avoid low blood sugars postprandially. - I suggested to:  Patient Instructions  Please continue: - Metformin 500 mg x2 at night - Lantus 30 units at night  Please change: - NovoLog: 8-10 units  before each meal  Please add: - Novolog Sliding scale: 150-200: +  1 unit 201-250: + 2 units 251-300: + 3 units >301: + 4 units  Try to take Novolog at the start of the meal.  Please come back in September, in 3 months.  - Will not check a fructosamine level today, but will do so at next visit - continue checking sugars at different times of the day - check 3-4x a day, rotating checks - advised for yearly eye exams >> she is UTD - Return to clinic in 3 mo with sugar log    2. PN  -Due to diabetes -Stable, on Neurontin.  No side effects from this.   Carlus Pavlov, MD PhD [facility] Endocrinology

## 2017-07-15 ENCOUNTER — Other Ambulatory Visit: Payer: Self-pay | Admitting: Internal Medicine

## 2017-07-22 ENCOUNTER — Telehealth: Payer: Self-pay | Admitting: Emergency Medicine

## 2017-07-22 ENCOUNTER — Telehealth: Payer: Self-pay | Admitting: Gastroenterology

## 2017-07-22 NOTE — Telephone Encounter (Signed)
Pt called and blood sugars are still elevated. She is wondering what she needs to do next. Please give her a call back thanks.

## 2017-07-22 NOTE — Telephone Encounter (Signed)
7/22 233 this am 236 at linch 7/21 222 in the am 190 at lunch 233 at bedtime 7/20 204 in the am 150 at lunch 221 at bedtime  Nothing has changed diet wise and is following the sliding scale. She also stated she had her sugars drop to 50 range twice. She said "something is just not right" and she had trouble being able to make it to her glucose tablets.  Please advise

## 2017-07-22 NOTE — Telephone Encounter (Signed)
Not knowing when she has the lows, it is almost impossible to change her regimen. Is she sure her insulin is not expired/inactive (heat exposure?). Or that she is not injecting in scar tisue? I would like her to check on these, change the injection sites and let me know in 2 days how this goes. Also, stay very well hydrated as hemoconcentration from dehydration raises the sugars.

## 2017-07-22 NOTE — Telephone Encounter (Signed)
Pt is aware and she will call us back in 2 days

## 2017-07-23 NOTE — Telephone Encounter (Signed)
Spoke with patient for 10 minutes. She states she is following the advise given at her last OV. She  Is drinking "lots of water and taking my fiber" but she continues to have "good bowel movements twice a week." She does feel bloated. She denies any fevers, vomiting or blood with the stool. She admits she doesn't always feel like going out of the house but she is trying to get regular exercise by walking down her driveway. She will continue with her current treatment.

## 2017-07-24 ENCOUNTER — Telehealth: Payer: Self-pay | Admitting: Internal Medicine

## 2017-07-24 NOTE — Telephone Encounter (Signed)
Pt stated she was in another dr appt and hung up

## 2017-07-24 NOTE — Telephone Encounter (Signed)
Patient would like a call back to discuss her insulin and how it should be stored   Please advise

## 2017-07-26 ENCOUNTER — Telehealth: Payer: Self-pay | Admitting: Emergency Medicine

## 2017-07-26 NOTE — Telephone Encounter (Signed)
Patient called back with her blood sugar levels for the past 3 days. They are as followed:  7/27  Breakfast- 215   Lunch-178   Dinner- 84   Bedtime- 190  7/28 Breakfast- 201 Lunch- 251 Dinner- 66 Bedtime- 274  7/29  Breakfast-187 Lunch- 198 Dinner- 161 Bedtime- 190

## 2017-07-26 NOTE — Telephone Encounter (Signed)
LMTCB

## 2017-07-26 NOTE — Telephone Encounter (Signed)
Please advise on below  

## 2017-07-26 NOTE — Telephone Encounter (Signed)
Due to the history of lows, I would suggest to continue to monitor the sugars for now on the same insulin doses.

## 2017-07-29 ENCOUNTER — Telehealth: Payer: Self-pay | Admitting: Emergency Medicine

## 2017-07-29 NOTE — Telephone Encounter (Signed)
This morning pts sugars was 276 and before bed it was 273, this is making the pt nervous. She would like to know if any changes can be made.

## 2017-07-29 NOTE — Telephone Encounter (Signed)
Pt called and stated her blood sugars are still high and she is scared. She wants to know what she needs to do. Please advise and give her a call back. Thanks.

## 2017-07-29 NOTE — Telephone Encounter (Signed)
See TC dated 07/26/17

## 2017-07-29 NOTE — Telephone Encounter (Signed)
Pt stated she will try that. She is going to call Wednesday AM to let us know how her sugars are so that she can hear from Dr. Elvera LennoxGherghe before her vacation

## 2017-07-29 NOTE — Telephone Encounter (Signed)
The Lantus dose appears OK, but may need a higher Novolog dose before meals - if she Is already using the 10 units, may need 12 with a larger meal.

## 2017-07-31 ENCOUNTER — Telehealth: Payer: Self-pay | Admitting: Internal Medicine

## 2017-07-31 ENCOUNTER — Other Ambulatory Visit: Payer: Self-pay | Admitting: Internal Medicine

## 2017-07-31 DIAGNOSIS — E139 Other specified diabetes mellitus without complications: Secondary | ICD-10-CM

## 2017-07-31 NOTE — Telephone Encounter (Signed)
Spoke with Bonita QuinLinda and she will reach out to patient today

## 2017-07-31 NOTE — Telephone Encounter (Signed)
I am not sure why sugars are still high. Let's schedule an appt with [name] to review how she is doing the injections. Bring the insulins with her to also check expiration dates.

## 2017-07-31 NOTE — Telephone Encounter (Signed)
Message left on machine to call me for an appointment next week.  Times given for Monday and Tuesday.  My phone number given to call me back today

## 2017-07-31 NOTE — Telephone Encounter (Signed)
Sugars since changing her insulin dose. Please advise

## 2017-07-31 NOTE — Telephone Encounter (Signed)
Sunday AM 230 Lunch 79 Dinner 150 Bed 199 Monday AM 216 Lunch 284 Dinner 250 Bed 278 Tuesday AM 232 Lunch 63 2 hours later 383 after a bowl of oatmeal dinner 122 bed 132 Today AM 310

## 2017-07-31 NOTE — Telephone Encounter (Signed)
Patient's sugar this morning was 318, next 170, last one dropped to 41 (today). Patient does not understand the extreme lows and highs. She knows what to eat. Trying not to panic. Please call pt at ph# [phone] asap to advise.

## 2017-08-01 NOTE — Telephone Encounter (Signed)
This has been handled and pt has appt with Pilar JarvisLinda S.

## 2017-08-05 ENCOUNTER — Encounter: Admitting: Nutrition

## 2017-08-06 ENCOUNTER — Telehealth: Payer: Self-pay | Admitting: Internal Medicine

## 2017-08-06 ENCOUNTER — Encounter: Payer: Self-pay | Admitting: Endocrinology

## 2017-08-06 ENCOUNTER — Ambulatory Visit (INDEPENDENT_AMBULATORY_CARE_PROVIDER_SITE_OTHER): Admitting: Endocrinology

## 2017-08-06 VITALS — BP 120/80 | HR 109 | Ht 64.0 in | Wt 168.2 lb

## 2017-08-06 DIAGNOSIS — E1065 Type 1 diabetes mellitus with hyperglycemia: Secondary | ICD-10-CM | POA: Diagnosis not present

## 2017-08-06 DIAGNOSIS — E139 Other specified diabetes mellitus without complications: Secondary | ICD-10-CM | POA: Diagnosis not present

## 2017-08-06 LAB — GLUCOSE, POCT (MANUAL RESULT ENTRY): POC Glucose: 236 mg/dl — AB (ref 70–99)

## 2017-08-06 MED ORDER — INSULIN GLARGINE 300 UNIT/ML ~~LOC~~ SOPN: 32.0000 [IU] | PEN_INJECTOR | Freq: Every day | SUBCUTANEOUS | 3 refills | Status: DC

## 2017-08-06 NOTE — Patient Instructions (Addendum)
Take 30 units Lantus at nite unit Sunday and if am sugars running over 160 can go up to 32 units  Lunch take a basal amount of 6 units instead of 8-10  Get a little protein with oatmeal in the morning:  meat or egg

## 2017-08-06 NOTE — Progress Notes (Signed)
Patient ID: [name], female   DOB: 10-01-1955, 62 y.o.   MRN: [id number]  HPI: [name] is a 62 y.o.-year-old female, initially referred by her PCP, Dr. Assunta Found (PA: Lenise Herald)  Diagnosis: LADA, dx 2005, insulin-dependent since ~2006, controlled, with complications (PN, gastroparesis?).    Treatment regimen: - Metformin 500 mg x2 at night - NovoLog: 12-14 >> 8  before each meal - Lantus 0 units in a.m. and  28 units at night  Correction doses: 150-200: + 1 unit 201-250: + 2 units 251-300: + 3 units >301: + 4 units   CURRENT management, blood sugar patterns and problems identified:  She has 2 different glucose monitors and there is no time start on 1 of them, data not available on this which is her major meter  FASTING blood sugars appear to be consistently high, range 270-303 recently  Blood sugars are reportedly very variable rest of the day  She said that she has tendency to hypoglycemia at random times but not overnight, may have occasional low sugars at lunchtime or mid afternoon  Blood sugar after supper is not consistently high, was 190 last night reportedly  She is eating a light meal at lunchtime was taking the same amount of mealtime coverage of 8 to 10 units  She does not frequently have any protein in the morning and may eat cereal or oatmeal; today 4 hours after her breakfast her sugar is over 200  She has not been able to get appointment with nurse educator because of lack of approval from her insurance  Although she is reportedly on BASAL insulin with Lantus twice a day she does not take any dose in the morning    Previous fructosamine levels: 05/07/2017: HbA1c calculated from the fructosamine was 7.7% 02/07/2017: HbA1c calculated from the fructosamine was higher, at 7.7%. 11/07/2016: HbA1c calc. from the fructosamine is a little lower, at 7.25% 08/07/2016: HbA1c calculated from the fructosamine is 7.5% 05/31/2016: HbA1c calculated from  fructosamine is better, at 7.25% 12/15/2015: HbA1c calculated from the fructosamine is higher, 8.4%. 08/24/2015:  HbA1c calculated from fructosamine is 7.4%. 05/24/2015: HbA1c calculated from fructosamine is 6.9%. ... Lab Results  Component Value Date   HGBA1C 5.6 11/07/2016   HGBA1C 4.6 07/16/2013   HGBA1C [facility] to [facility] 07/16/2013     - No CKD, last BUN/creatinine:  Lab Results  Component Value Date   BUN 14 11/07/2016   CREATININE 0.58 11/07/2016   No microalbuminuria: Lab Results  Component Value Date   MICRALBCREAT 5.4 11/07/2016    -+ HL; lipids: Lab Results  Component Value Date   CHOL 187 11/07/2016   HDL 50.70 11/07/2016   LDLCALC 113 (H) 11/07/2016   TRIG 113.0 11/07/2016   CHOLHDL 4 11/07/2016  Not on a statin.  - last eye exam was in [place] in 09/2016: No DR + small cataract - [facility]: Dr. Janne Lab. - + numbness and tingling in her feet.  On Neurontin. She is seeing a podiatrist [facility]).  2 toenails removed due to fungal infection  Pt has a h/o admission for Li toxicity 04/2012. She also has a dx of Parkinson ds.  And bipolar disease. Also: GERD, HTN, Fibromyalgia >> on Neurontin.  Last TSH -normal: Lab Results  Component Value Date   TSH 0.57 11/07/2016   ROS: Constitutional: + weight gain/no weight loss, + fatigue, + subjective hyperthermia, no subjective hypothermia Eyes: no blurry vision, no xerophthalmia ENT: no sore throat, no  nodules palpated in throat, no dysphagia, no odynophagia,+ hoarseness Cardiovascular: no CP/no SOB/no palpitations/no leg swelling Respiratory: no cough/no SOB/no wheezing Gastrointestinal: + N/no V/no D/+ C/no acid reflux Musculoskeletal: no muscle aches/+ joint aches Skin: no rashes, no hair loss Neurological: no tremors/no numbness/no tingling/no dizziness, + HA  I reviewed pt's medications, allergies, PMH, social hx, family hx, and changes were documented in the history of present illness.  Otherwise, unchanged from my initial visit note.  Since last visit, she stopped Prozac.  Past Medical History:  Diagnosis Date  . Anxiety   . Bipolar 1 disorder (HCC)   . Breast density 06/25/2012   Right breast density, will get mammogram and US  . Depression   . Diabetes mellitus without complication (HCC)   . Duodenitis   . HTN (hypertension)   . Hyperlipidemia   . IBS (irritable bowel syndrome)    Past Surgical History:  Procedure Laterality Date  . ABDOMINAL HYSTERECTOMY    . CHOLECYSTECTOMY    . COLONOSCOPY     Social History   Socioeconomic History  . Marital status: Married    Spouse name: phillip  . Number of children: 1  . Years of education: 2512  . Highest education level: High school graduate  Occupational History  . Occupation: Disabled  Social Needs  . Financial resource strain: Not hard at all  . Food insecurity:    Worry: Never true    Inability: Never true  . Transportation needs:    Medical: Yes    Non-medical: Yes  Tobacco Use  . Smoking status: Never Smoker  . Smokeless tobacco: Never Used  Substance and Sexual Activity  . Alcohol use: No    Alcohol/week: 0.0 oz  . Drug use: No  . Sexual activity: Not Currently  Lifestyle  . Physical activity:    Days per week: 0 days    Minutes per session: 0 min  . Stress: Not on file  Relationships  . Social connections:    Talks on phone: Never    Gets together: Never    Attends religious service: More than 4 times per year    Active member of club or organization: No    Attends meetings of clubs or organizations: Never    Relationship status: Married  . Intimate partner violence:    Fear of current or ex partner: No    Emotionally abused: No    Physically abused: No    Forced sexual activity: No  Other Topics Concern  . Not on file  Social History Narrative   Lives at home w/ her husband   Left-handed   Caffeine: 1-2 cups of coffee daily   Current Outpatient Medications on File Prior to  Visit  Medication Sig Dispense Refill  . AMBULATORY NON FORMULARY MEDICATION Squatty Potty x 1 1 each 0  . amLODipine (NORVASC) 5 MG tablet Take 5 mg by mouth daily.    . BD PEN NEEDLE NANO U/F 32G X 4 MM MISC USE WITH LANTUS AND NOVOLOG PENS 200 each 4  . BENICAR 40 MG tablet     . desipramine (NORPRAMIN) 25 MG tablet Take 2 tablets (50 mg total) by mouth daily. 180 tablet 1  . FREESTYLE LITE test strip USE TO CHECK BLOOD SUGAR 6 TIMES PER DAY 600 each 2  . gabapentin (NEURONTIN) 800 MG tablet Take 800 mg by mouth. 1 tablet in the morning and 2 tablets at bedtime.    Marland Kitchen. glucagon (GLUCAGON EMERGENCY) 1 MG injection Inject  1 mg into the muscle once as needed. 1 each 12  . hydrOXYzine (ATARAX/VISTARIL) 25 MG tablet 1 tab Bid for anxiety 180 tablet 1  . insulin aspart (NOVOLOG FLEXPEN) 100 UNIT/ML FlexPen INJECT UP TO 40 UNITS DAILY, ALSO INCLUDING SLIDING SCALE 45 mL 3  . Insulin Glargine (LANTUS SOLOSTAR) 100 UNIT/ML Solostar Pen Inject 30 units at bedtime, under skin 15 pen 11  . Insulin Syringes, Disposable, U-100 1 ML MISC To use for insulin injection.. 100 each 1  . Lancets (FREESTYLE) lancets Use to test blood sugar 4 to 6 times daily as instructed. 600 each 1  . LORazepam (ATIVAN) 1 MG tablet 1  q day prn  May repeat 30 tablet 0  . metFORMIN (GLUCOPHAGE) 500 MG tablet TAKE 1 TABLET TWICE A DAY WITH MEALS 180 tablet 11  . omeprazole (PRILOSEC) 40 MG capsule Take 1 capsule (40 mg total) by mouth 2 (two) times daily. 180 capsule 3  . ondansetron (ZOFRAN ODT) 4 MG disintegrating tablet 1-2 tabs every 4-6 hours as needed four nausea 180 tablet 3  . OSCIMIN 0.125 MG SUBL DISSOLVE 1 TABLET UNDER THE TONGUE EVERY 4 HOURS AS NEEDED 120 each 3  . Plecanatide (TRULANCE) 3 MG TABS Take 3 mg by mouth daily. 90 tablet 3   No current facility-administered medications on file prior to visit.    Allergies  Allergen Reactions  . Abilify [Aripiprazole]     Patient is intolerant  . Reglan  [Metoclopramide] Other (See Comments)    Chest pains   Family History  Problem Relation Age of Onset  . Hypertension Mother   . Bipolar disorder Mother   . Cancer Mother   . Heart disease Brother   . Thyroid disease Sister   . Hypertension Sister   . Bipolar disorder Sister   . Depression Father   . Cancer Father   . Bipolar disorder Daughter   . Bipolar disorder Maternal Aunt   . Colon cancer Neg Hx   . Colon polyps Neg Hx   . Esophageal cancer Neg Hx   . Kidney disease Neg Hx   . Gallbladder disease Neg Hx   . Diabetes Neg Hx   . Dementia Neg Hx     PE: Ht 5\' 4"  (1.626 m)   Wt 168 lb 3.2 oz (76.3 kg)   BMI 28.87 kg/m    Wt Readings from Last 3 Encounters:  08/06/17 168 lb 3.2 oz (76.3 kg)  06/28/17 173 lb 12.8 oz (78.8 kg)  05/28/17 174 lb (78.9 kg)   Exam not indicated  ASSESSMENT: 1. LADA, insulin-dependent, uncontrolled, with complications  Labile blood sugar with tendency to hypoglycemia Agrees to have persistently high fasting readings indicating an adequate amount of Lantus Also likely is not getting 24-hour action of Lantus and needs newer analog basal insulin Also not getting mealtime coverage based on meal size or carbohydrate content No protein intake in the morning making her likely to have some low sugars at lunchtime  PLAN:    She will switch from Lantus to [facility], prescription sent to [facility]  For now she will increase her Lantus by at least 2 units for the next 5 days and then if her morning sugars are still over 160 consistently she will go up to a total of 32 units  When she switches to Toujeo she will increase the dose by 2 units automatically and may need higher doses potentially also  Her meter was programmed to the proper date and  time to enable download  She needs to consider continuous glucose monitoring, will defer to her current endocrinologist  She needs to add protein to breakfast  Reduce NovoLog to 6 units as a base  mealtime coverage at lunchtime which is a lighter meal  We will try to get approval for her to be seen by nurse educator through her insurance   Reather Littler 08/06/17  Counseling time on subjects discussed in assessment and plan sections is over 50% of today's 25 minute visit

## 2017-08-06 NOTE — Telephone Encounter (Signed)
Called and spoke with pt because she canceled appointment with Pilar JarvisLinda S. She stated that "no one can find out what is going on with me and I am going to die just die" After calming the patient down and telling her that her anxiety will not help her sugar numbers she agreed to come in to see Dr. Lucianne MussKumar. Dr. Elvera LennoxGherghe is out of [place] and Dr. Lucianne MussKumar is the covering provider.

## 2017-08-06 NOTE — Telephone Encounter (Signed)
Patient stated that for the last 9 days her blood sugar has been over 300 And over 7 times it has been over 200 Under 70 about 5 times. She stated that she has not changed her diet and is not sure what is going on with her sugars.  Please advise

## 2017-08-07 ENCOUNTER — Other Ambulatory Visit: Payer: Self-pay

## 2017-08-07 ENCOUNTER — Telehealth: Payer: Self-pay | Admitting: Emergency Medicine

## 2017-08-07 MED ORDER — INSULIN DEGLUDEC 100 UNIT/ML ~~LOC~~ SOPN: PEN_INJECTOR | SUBCUTANEOUS | 2 refills | Status: DC

## 2017-08-07 NOTE — Telephone Encounter (Signed)
Pt called and stated she needs a PA for the medication Insulin Glargine (TOUJEO SOLOSTAR) 300 UNIT/ML SOPN. Thanks.

## 2017-08-07 NOTE — Telephone Encounter (Signed)
I called patient to let her know, but had to leave VM. I stated that toujeo was switched to tresiba & if she haad questions to call back.

## 2017-08-08 ENCOUNTER — Other Ambulatory Visit: Payer: Self-pay

## 2017-08-08 MED ORDER — INSULIN GLARGINE 100 UNIT/ML SOLOSTAR PEN: 30.0000 [IU] | PEN_INJECTOR | Freq: Every day | SUBCUTANEOUS | 0 refills | Status: DC

## 2017-08-12 ENCOUNTER — Telehealth: Payer: Self-pay | Admitting: Emergency Medicine

## 2017-08-12 ENCOUNTER — Telehealth: Payer: Self-pay | Admitting: Gastroenterology

## 2017-08-12 NOTE — Telephone Encounter (Signed)
Pt called and stated she needs a PA for diabetic classes. If she doesn't have one insurance will not cover them. Please advise thanks.

## 2017-08-12 NOTE — Telephone Encounter (Signed)
Please advise on below gave hand written note to you from pt about insurance requirements

## 2017-08-12 NOTE — Telephone Encounter (Signed)
Still awaiting the form, I called again and requested it now

## 2017-08-13 NOTE — Telephone Encounter (Signed)
Patient is unhappy today. She is very negative about her out look on life due to her bowel issues. She states she "did not have a bowel movement for weeks", then she finally "just exploded." She wants her IBS to stop being an issue. "Why can't anyone make this better?" 20 minute telephone call. Patient expresses frustrations and states she feels "hopeless." Reviewed the methods recommended by her provider to deal with constipation. She will continue her medications.

## 2017-08-15 ENCOUNTER — Telehealth: Payer: Self-pay | Admitting: Internal Medicine

## 2017-08-15 NOTE — Telephone Encounter (Signed)
Pt called and states her sugars are still all over the place and she does not want to talk to any other doctor. Please review Dr. Emelda BrothersKumars OV and her medication changes. She is calling with sugars on Monday to give an update.

## 2017-08-16 ENCOUNTER — Ambulatory Visit (INDEPENDENT_AMBULATORY_CARE_PROVIDER_SITE_OTHER): Admitting: Psychiatry

## 2017-08-16 VITALS — BP 136/82 | HR 101 | Ht 64.0 in | Wt 167.0 lb

## 2017-08-16 DIAGNOSIS — F3130 Bipolar disorder, current episode depressed, mild or moderate severity, unspecified: Secondary | ICD-10-CM | POA: Diagnosis not present

## 2017-08-16 MED ORDER — DESIPRAMINE HCL 25 MG PO TABS: ORAL_TABLET | ORAL | 3 refills | Status: DC

## 2017-08-16 MED ORDER — LAMOTRIGINE 25 MG PO TABS: ORAL_TABLET | ORAL | 2 refills | Status: DC

## 2017-08-16 MED ORDER — HYDROXYZINE HCL 25 MG PO TABS: ORAL_TABLET | ORAL | 1 refills | Status: DC

## 2017-08-16 NOTE — Progress Notes (Signed)
Psychiatric Initial Adult Assessment   Patient Identification: Sabrina ComptonBarbara L [name] MRN:  [id number] Date of Evaluation:  08/16/2017 Referral Source: transfer from Dr Suella BroadArfene Chief Complaint:    Visit Diagnosis:  No diagnosis found.  History of Present Illness  At this time the patient is very engaging. She tells her that she appreciates everything that for her shares that she has multiple medical illnesses got worse. Her biggest problem is that of insulin-dependent diabetes. She also has fibromyalgia, osteoporosis, irritable bowel. Patient is in therapy on a regular basis.She's had a history of significant psychiatric illness is been psychiatrically hospitalized 3 times last one was 12 years. One [facility]. Her diagnosis is still not clear. She admits to months of being persistently depressed the other she still enjoys life and can distract herself without problem. I do not think her depression all that severe. She's trouble sleeping is been a chronic problem. She is eating well has good energy has no problems concentrating she's not suicidal now and never has she is no evidence of psychosis. She drinks no alcohol uses no drugs. Her major problem is that of her  Diabetes. The patient denies symptoms of mania. Depression Symptoms:  depressed mood, (Hypo) Manic Symptoms:   Anxiety Symptoms:  Excessive Worry, Psychotic Symptoms:   PTSD Symptoms:   Past Psychiatric History: 2 psychiatric hospitalizations, under the care of Dr.Cunningham under the care of Dr. Suella BroadArfene  Previous Psychotropic Medications: Yes   Substance Abuse History in the last 12 months:  Yes.    Consequences of Substance Abuse: NA  Past Medical History:  Past Medical History:  Diagnosis Date  . Anxiety   . Bipolar 1 disorder (HCC)   . Breast density 06/25/2012   Right breast density, will get mammogram and US  . Depression   . Diabetes mellitus without complication (HCC)   . Duodenitis   . HTN  (hypertension)   . Hyperlipidemia   . IBS (irritable bowel syndrome)     Past Surgical History:  Procedure Laterality Date  . ABDOMINAL HYSTERECTOMY    . CHOLECYSTECTOMY    . COLONOSCOPY      Family Psychiatric History:   Family History:  Family History  Problem Relation Age of Onset  . Hypertension Mother   . Bipolar disorder Mother   . Cancer Mother   . Heart disease Brother   . Thyroid disease Sister   . Hypertension Sister   . Bipolar disorder Sister   . Depression Father   . Cancer Father   . Bipolar disorder Daughter   . Bipolar disorder Maternal Aunt   . Colon cancer Neg Hx   . Colon polyps Neg Hx   . Esophageal cancer Neg Hx   . Kidney disease Neg Hx   . Gallbladder disease Neg Hx   . Diabetes Neg Hx   . Dementia Neg Hx     Social History:   Social History   Socioeconomic History  . Marital status: Married    Spouse name: phillip  . Number of children: 1  . Years of education: 3812  . Highest education level: High school graduate  Occupational History  . Occupation: Disabled  Social Needs  . Financial resource strain: Not hard at all  . Food insecurity:    Worry: Never true    Inability: Never true  . Transportation needs:    Medical: Yes    Non-medical: Yes  Tobacco Use  . Smoking status: Never Smoker  .  Smokeless tobacco: Never Used  Substance and Sexual Activity  . Alcohol use: No    Alcohol/week: 0.0 standard drinks  . Drug use: No  . Sexual activity: Not Currently  Lifestyle  . Physical activity:    Days per week: 0 days    Minutes per session: 0 min  . Stress: Not on file  Relationships  . Social connections:    Talks on phone: Never    Gets together: Never    Attends religious service: More than 4 times per year    Active member of club or organization: No    Attends meetings of clubs or organizations: Never    Relationship status: Married  Other Topics Concern  . Not on file  Social History Narrative   Lives at home w/ her  husband   Left-handed   Caffeine: 1-2 cups of coffee daily    Additional Social History:   Allergies:   Allergies  Allergen Reactions  . Abilify [Aripiprazole]     Patient is intolerant  . Reglan [Metoclopramide] Other (See Comments)    Chest pains    Metabolic Disorder Labs: Lab Results  Component Value Date   HGBA1C 5.6 11/07/2016   MPG 85 07/16/2013   MPG 91 04/15/2012   No results found for: PROLACTIN Lab Results  Component Value Date   CHOL 187 11/07/2016   TRIG 113.0 11/07/2016   HDL 50.70 11/07/2016   CHOLHDL 4 11/07/2016   VLDL 22.6 11/07/2016   LDLCALC 113 (H) 11/07/2016   LDLCALC 133 (H) 08/24/2015     Current Medications: Current Outpatient Medications  Medication Sig Dispense Refill  . AMBULATORY NON FORMULARY MEDICATION Squatty Potty x 1 1 each 0  . amLODipine (NORVASC) 5 MG tablet Take 5 mg by mouth daily.    . BD PEN NEEDLE NANO U/F 32G X 4 MM MISC USE WITH LANTUS AND NOVOLOG PENS 200 each 4  . BENICAR 40 MG tablet     . desipramine (NORPRAMIN) 25 MG tablet 3  qam 90 tablet 3  . FREESTYLE LITE test strip USE TO CHECK BLOOD SUGAR 6 TIMES PER DAY 600 each 2  . gabapentin (NEURONTIN) 800 MG tablet Take 800 mg by mouth. 1 tablet in the morning and 2 tablets at bedtime.    Marland Kitchen. glucagon (GLUCAGON EMERGENCY) 1 MG injection Inject 1 mg into the muscle once as needed. 1 each 12  . hydrOXYzine (ATARAX/VISTARIL) 25 MG tablet 1 tab Bid for anxiety 180 tablet 1  . insulin aspart (NOVOLOG FLEXPEN) 100 UNIT/ML FlexPen INJECT UP TO 40 UNITS DAILY, ALSO INCLUDING SLIDING SCALE 45 mL 3  . insulin degludec (TRESIBA FLEXTOUCH) 100 UNIT/ML SOPN FlexTouch Pen Inject 30 units into the skin daily. 15 mL 2  . Insulin Glargine (LANTUS SOLOSTAR) 100 UNIT/ML Solostar Pen Inject 30 Units into the skin daily. 27 mL 0  . Insulin Syringes, Disposable, U-100 1 ML MISC To use for insulin injection.. 100 each 1  . Lancets (FREESTYLE) lancets Use to test blood sugar 4 to 6 times daily as  instructed. 600 each 1  . LORazepam (ATIVAN) 1 MG tablet 1  q day prn  May repeat 30 tablet 0  . metFORMIN (GLUCOPHAGE) 500 MG tablet TAKE 1 TABLET TWICE A DAY WITH MEALS 180 tablet 11  . omeprazole (PRILOSEC) 40 MG capsule Take 1 capsule (40 mg total) by mouth 2 (two) times daily. 180 capsule 3  . ondansetron (ZOFRAN ODT) 4 MG disintegrating tablet 1-2 tabs every 4-6 hours  as needed four nausea 180 tablet 3  . OSCIMIN 0.125 MG SUBL DISSOLVE 1 TABLET UNDER THE TONGUE EVERY 4 HOURS AS NEEDED 120 each 3  . Plecanatide (TRULANCE) 3 MG TABS Take 3 mg by mouth daily. 90 tablet 3  . lamoTRIgine (LAMICTAL) 25 MG tablet 1  qhs 30 tablet 2   No current facility-administered medications for this visit.     Neurologic: Headache: No Seizure: No Paresthesias:No  Musculoskeletal: Strength & Muscle Tone: within normal limits Gait & Station: normal Patient leans: N/A  Psychiatric Specialty Exam: ROS  Blood pressure 136/82, pulse (!) 101, height 5\' 4"  (1.626 m), weight 167 lb (75.8 kg), SpO2 95 %.Body mass index is 28.67 kg/m.  General Appearance: Casual  Eye Contact:  Good  Speech:  Clear and Coherent  Volume:  Normal  Mood:  Anxious  Affect:  Appropriate  Thought Process:  Goal Directed  Orientation:  NA  Thought Content:  WDL  Suicidal Thoughts:  No  Homicidal Thoughts:  No  Memory:  Negative  Judgement:  Good  Insight:  Fair  Psychomotor Activity:  NA and Normal  Concentration:    Recall:  Fair  Fund of Knowledge:Good  Language: Good  Akathisia:  No  Handed:  Right  AIMS (if indicated):    Assets:  Desire for Improvement  ADL's:  Intact  Cognition: WNL  Sleep:      Treatment Plan Summary:  At this time the patient will increase her desipramine. 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 She'll increase her desipramine to a dose of 75 mg from a dose of 50. In 2 weeks she'll get a desipramine level. There are normal 1 problem seems to be of mood disorders that dysthymic  disorder. I have no evidence that she has mania. She is no psychosisat this time. 31 problem seems mood disorder. She'll continue taking Vistaril as prescribed she says, July the morning. Given her the possibility that she could have bipolar disorder and is yet to be clearly diagnosed go ahead and give her Lamictal 25 mg at night. I'll be using this as a mood stabilizer just in case as I increase her desipramine she's of the risk of flipping into a state of mania. Again I have no evidence of persistent irritability or euphoria in her history. When she returns to see me in about 6 weeks I will have a blood level of her desipramine to adjust and have asked her to bring her husband.Patient is not suicidal. She's actually functioning very well  Gypsy Balsam, MD 8/16/20199:59 AM

## 2017-08-19 NOTE — Telephone Encounter (Signed)
Pt stated that she is "scared to death to follow the instructions" because she said she has to starve herself to even get it down a little but it does not work. She stated she has been unable to leave her house for 2 weeks and that her husband will not speak to her. She said she will follow the instructions for a few days but will call if her sugars stay high.

## 2017-08-19 NOTE — Telephone Encounter (Signed)
If her sugars are still high in the morning she needs to make sure she is not eating at night and also avoid snacking after dinner.  Let us see if Toujeo (which was just started by Dr. Lucianne MussKumar) will help with this.

## 2017-08-19 NOTE — Telephone Encounter (Signed)
Pt would like to know if she should still use the sliding scale? She said that her body is "dying" and she "cant hang on much longer" I advised pt I would send this back and she hung up.

## 2017-08-19 NOTE — Telephone Encounter (Signed)
Toujeo was not covered by insurance so pt is back on the Lantus. She is taking 30 units at night. Pt states she never eats after 6, states she will only eat a popcorn cake or sugar free jello. Says her sugars are all over the place and awful. She said she "knows what to do" and that she has been doing the for 15 years. She doesn't understand what the problem is and that she is not doing anything different since Dr. Emelda BrothersKumars visit because he didn't change anything except try to change the medication but none had been covered. Please advise patient is very worried and stressed about all of this and feels she is in a "very bad state" right now with her health.

## 2017-08-19 NOTE — Telephone Encounter (Signed)
We can increase b'fast Novolog to: - 10 units in am and with dinner - 6 units with lunch  Increase Lantus to 32-34 units at night.

## 2017-08-19 NOTE — Telephone Encounter (Signed)
To make it simpler for her, she may just use these doses, without the sliding scale.

## 2017-08-30 ENCOUNTER — Ambulatory Visit (HOSPITAL_COMMUNITY): Payer: Self-pay

## 2017-09-04 ENCOUNTER — Other Ambulatory Visit (HOSPITAL_COMMUNITY): Payer: Self-pay | Admitting: Psychiatry

## 2017-09-04 ENCOUNTER — Other Ambulatory Visit (HOSPITAL_COMMUNITY): Payer: Self-pay

## 2017-09-04 DIAGNOSIS — Z79899 Other long term (current) drug therapy: Secondary | ICD-10-CM

## 2017-09-04 DIAGNOSIS — F3130 Bipolar disorder, current episode depressed, mild or moderate severity, unspecified: Secondary | ICD-10-CM

## 2017-09-10 LAB — SPECIMEN STATUS REPORT

## 2017-09-12 ENCOUNTER — Ambulatory Visit (INDEPENDENT_AMBULATORY_CARE_PROVIDER_SITE_OTHER): Admitting: Internal Medicine

## 2017-09-12 ENCOUNTER — Encounter: Payer: Self-pay | Admitting: Internal Medicine

## 2017-09-12 VITALS — BP 140/80 | HR 87 | Ht 62.0 in | Wt 169.2 lb

## 2017-09-12 DIAGNOSIS — E139 Other specified diabetes mellitus without complications: Secondary | ICD-10-CM | POA: Diagnosis not present

## 2017-09-12 DIAGNOSIS — G63 Polyneuropathy in diseases classified elsewhere: Secondary | ICD-10-CM

## 2017-09-12 NOTE — Patient Instructions (Signed)
Please continue: - Metformin 500 mg x2 at night - Lantus 32-34 units at bedtime - NovoLog:  10 units in am 6 units with lunch 10 units in dinner   Please stop at the lab.  Please return in 3 months with your sugar log.

## 2017-09-12 NOTE — Progress Notes (Signed)
Patient ID: [name], female   DOB: 1955-02-11, 62 y.o.   MRN: [id number]  HPI: [name] is a 62 y.o.-year-old female, initially referred by her PCP, Dr. Assunta Found (PA: Lenise Herald), returning for f/u for LADA, dx 2005, insulin-dependent since ~2006, controlled, with complications (PN, gastroparesis?). Last visit almost 3 months ago.  She continues to have anxiety and panic attacks.  Sugars are worse whenever she is very stressed.  She is seeing Dr. Donell Beers.  Imipramine was recently increased and she also started lamotrigine.  Reviewed history: She was on an Omnipod insulin pump on 08/01/2015. She had severe anxiety and depression and got so overwhelmed with the insulin pump that we had to take her off the pump.   We switched her to a basal-bolus insulin regimen, but she was unable to calculate the insulin doses based on insulin to carb ratio, so now she is using fixed rapid acting insulin doses.  Previous fructosamine levels: 05/07/2017: HbA1c calculated from the fructosamine was 7.7% 02/07/2017: HbA1c calculated from the fructosamine was higher, at 7.7%. 11/07/2016: HbA1c calc. from the fructosamine is a little lower, at 7.25% 08/07/2016: HbA1c calculated from the fructosamine is 7.5% 05/31/2016: HbA1c calculated from fructosamine is better, at 7.25% 12/15/2015: HbA1c calculated from the fructosamine is higher, 8.4%. 08/24/2015:  HbA1c calculated from fructosamine is 7.4%. 05/24/2015: HbA1c calculated from fructosamine is 6.9%. ... Lab Results  Component Value Date   HGBA1C 5.6 11/07/2016   HGBA1C 4.6 07/16/2013   HGBA1C sent to [facility] 07/16/2013   She is on: - Metformin 500 mg x2 at night - NovoLog:  10 units in am 6 units with lunch 10 units in dinner  - Lantus 32-34 units at bedtime    Pt checks her sugars 4 times a day per review of the log - sugars are most stable in a long while: - am:  66, 128-229, 254 >> 108-272 >> 116-228, 255 - 2h after b'fast: 98 >> 342  x1 >> n/c - before lunch:47, 71-259, 331 >> 43, 61-206, 251, 308 - 2h after lunch: 83 >> 32 x1 >> n/c >> 128 >> n/c - before dinner: 55, 102-222, 324 >> 32, 59, 82-273 >> 84-230, 290, 316 - 2h after dinner: 211-365 >> 162 >> n/c - bedtime:62, 80, 103-226, 240 >> 38, 46, 56-220, 245 >> 82-238, 250 - at night: 116 >> 82, 250 >> 90 >> n/c Lowest sugar was 38 >> 43; she has hypoglycemia awareness in the 50s. Highest sugar was 331 >> 316 (in last 2 months)  Pt's meals are: - Breakfast: toast, bowl of cereal + 2% milk (2x a week) or sugar free oatmeal - Lunch: soup, sometimes sandwich, vegetables - Dinner: chicken/pork/beef + vegetable, no starch - Snacks: sugar free sweets, chia seeds/flax seeds  -No CKD, last BUN/creatinine:  Lab Results  Component Value Date   BUN 14 11/07/2016   CREATININE 0.58 11/07/2016   Normal ACR: Lab Results  Component Value Date   MICRALBCREAT 5.4 11/07/2016    -+ HL; lipids: Lab Results  Component Value Date   CHOL 187 11/07/2016   HDL 50.70 11/07/2016   LDLCALC 113 (H) 11/07/2016   TRIG 113.0 11/07/2016   CHOLHDL 4 11/07/2016  She is not on a statin.  -Last eye exam was in  in 09/2016: No DR, + small cataract- [facility]: Dr. Janne Lab.  -+ Numbness and tingling in her feet.  On Neurontin She is seeing a podiatrist [facility]).  2  toenails removed due to fungal infection  Pt has a h/o admission for Li toxicity 04/2012. She also has a dx of Parkinson ds.  And bipolar disease. Also: GERD, HTN, Fibromyalgia >> on Neurontin.  Last TSH -normal: Lab Results  Component Value Date   TSH 0.57 11/07/2016   ROS: Constitutional: no weight gain/no weight loss, + fatigue, + subjective hyperthermia, no subjective hypothermia Eyes: no blurry vision, no xerophthalmia ENT: no sore throat, no nodules palpated in throat, no dysphagia, no odynophagia, no hoarseness Cardiovascular: no CP/no SOB/no palpitations/no leg  swelling Respiratory: no cough/no SOB/no wheezing Gastrointestinal: + N/no V/no D/+ C/no acid reflux Musculoskeletal: no muscle aches/no joint aches Skin: no rashes, + hair loss Neurological: no tremors/no numbness/no tingling/no dizziness, + HA  I reviewed pt's medications, allergies, PMH, social hx, family hx, and changes were documented in the history of present illness. Otherwise, unchanged from my initial visit note.  Past Medical History:  Diagnosis Date  . Anxiety   . Bipolar 1 disorder (HCC)   . Breast density 06/25/2012   Right breast density, will get mammogram and [place]  . Depression   . Diabetes mellitus without complication (HCC)   . Duodenitis   . HTN (hypertension)   . Hyperlipidemia   . IBS (irritable bowel syndrome)    Past Surgical History:  Procedure Laterality Date  . ABDOMINAL HYSTERECTOMY    . CHOLECYSTECTOMY    . COLONOSCOPY     Social History   Socioeconomic History  . Marital status: Married    Spouse name: phillip  . Number of children: 1  . Years of education: 7  . Highest education level: High school graduate  Occupational History  . Occupation: Disabled  Social Needs  . Financial resource strain: Not hard at all  . Food insecurity:    Worry: Never true    Inability: Never true  . Transportation needs:    Medical: Yes    Non-medical: Yes  Tobacco Use  . Smoking status: Never Smoker  . Smokeless tobacco: Never Used  Substance and Sexual Activity  . Alcohol use: No    Alcohol/week: 0.0 standard drinks  . Drug use: No  . Sexual activity: Not Currently  Lifestyle  . Physical activity:    Days per week: 0 days    Minutes per session: 0 min  . Stress: Not on file  Relationships  . Social connections:    Talks on phone: Never    Gets together: Never    Attends religious service: More than 4 times per year    Active member of club or organization: No    Attends meetings of clubs or organizations: Never    Relationship status: Married   . Intimate partner violence:    Fear of current or ex partner: No    Emotionally abused: No    Physically abused: No    Forced sexual activity: No  Other Topics Concern  . Not on file  Social History Narrative   Lives at home w/ her husband   Left-handed   Caffeine: 1-2 cups of coffee daily   Current Outpatient Medications on File Prior to Visit  Medication Sig Dispense Refill  . AMBULATORY NON FORMULARY MEDICATION Squatty Potty x 1 1 each 0  . amLODipine (NORVASC) 5 MG tablet Take 5 mg by mouth daily.    . BD PEN NEEDLE NANO U/F 32G X 4 MM MISC USE WITH LANTUS AND NOVOLOG PENS 200 each 4  . BENICAR 40 MG tablet     .  desipramine (NORPRAMIN) 25 MG tablet 3  qam 90 tablet 3  . FREESTYLE LITE test strip USE TO CHECK BLOOD SUGAR 6 TIMES PER DAY 600 each 2  . gabapentin (NEURONTIN) 800 MG tablet Take 800 mg by mouth. 1 tablet in the morning and 2 tablets at bedtime.    Marland Kitchen glucagon (GLUCAGON EMERGENCY) 1 MG injection Inject 1 mg into the muscle once as needed. 1 each 12  . hydrOXYzine (ATARAX/VISTARIL) 25 MG tablet 1 tab Bid for anxiety 180 tablet 1  . insulin aspart (NOVOLOG FLEXPEN) 100 UNIT/ML FlexPen INJECT UP TO 40 UNITS DAILY, ALSO INCLUDING SLIDING SCALE 45 mL 3  . insulin degludec (TRESIBA FLEXTOUCH) 100 UNIT/ML SOPN FlexTouch Pen Inject 30 units into the skin daily. 15 mL 2  . Insulin Glargine (LANTUS SOLOSTAR) 100 UNIT/ML Solostar Pen Inject 30 Units into the skin daily. 27 mL 0  . Insulin Syringes, Disposable, U-100 1 ML MISC To use for insulin injection.. 100 each 1  . lamoTRIgine (LAMICTAL) 25 MG tablet 1  qhs 30 tablet 2  . Lancets (FREESTYLE) lancets Use to test blood sugar 4 to 6 times daily as instructed. 600 each 1  . LORazepam (ATIVAN) 1 MG tablet 1  q day prn  May repeat 30 tablet 0  . metFORMIN (GLUCOPHAGE) 500 MG tablet TAKE 1 TABLET TWICE A DAY WITH MEALS 180 tablet 11  . omeprazole (PRILOSEC) 40 MG capsule Take 1 capsule (40 mg total) by mouth 2 (two) times daily.  180 capsule 3  . ondansetron (ZOFRAN ODT) 4 MG disintegrating tablet 1-2 tabs every 4-6 hours as needed four nausea 180 tablet 3  . OSCIMIN 0.125 MG SUBL DISSOLVE 1 TABLET UNDER THE TONGUE EVERY 4 HOURS AS NEEDED 120 each 3  . Plecanatide (TRULANCE) 3 MG TABS Take 3 mg by mouth daily. 90 tablet 3   No current facility-administered medications on file prior to visit.    Allergies  Allergen Reactions  . Abilify [Aripiprazole]     Patient is intolerant  . Reglan [Metoclopramide] Other (See Comments)    Chest pains   Family History  Problem Relation Age of Onset  . Hypertension Mother   . Bipolar disorder Mother   . Cancer Mother   . Heart disease Brother   . Thyroid disease Sister   . Hypertension Sister   . Bipolar disorder Sister   . Depression Father   . Cancer Father   . Bipolar disorder Daughter   . Bipolar disorder Maternal Aunt   . Colon cancer Neg Hx   . Colon polyps Neg Hx   . Esophageal cancer Neg Hx   . Kidney disease Neg Hx   . Gallbladder disease Neg Hx   . Diabetes Neg Hx   . Dementia Neg Hx     PE: BP 140/80   Pulse 87   Ht 5\' 2"  (1.575 m)   Wt 169 lb 3.2 oz (76.7 kg)   SpO2 98%   BMI 30.95 kg/m    Wt Readings from Last 3 Encounters:  09/12/17 169 lb 3.2 oz (76.7 kg)  08/16/17 167 lb (75.8 kg)  08/06/17 168 lb 3.2 oz (76.3 kg)   Constitutional: overweight, in NAD Eyes: PERRLA, EOMI, no exophthalmos ENT: moist mucous membranes, no thyromegaly, no cervical lymphadenopathy Cardiovascular: RRR, No MRG Respiratory: CTA B Gastrointestinal: abdomen soft, NT, ND, BS+ Musculoskeletal: no deformities, strength intact in all 4 Skin: moist, warm, no rashes Neurological: no tremor with outstretched hands, DTR normal in all 4  ASSESSMENT: 1. LADA, insulin-dependent, uncontrolled, with complications - peripheral neuropathy - gastroparesis?  Component     Latest Ref Rng 07/16/2013  C-Peptide     0.80 - 3.90 ng/mL 1.29  Glucose     70 - 99 mg/dL 161183 (H)   Glutamic Acid Decarb Ab     <=1.0 U/mL 11.3 (H)  Pancreatic Islet Cell Antibody     <5 JDF Units 5 (A)  + anti-pancreatic antibodies >> LADA rather than type 2 DM. She still has a positive C peptide >> still has insulin secretion. For this reason, we continued the Metformin.  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  Patient with complex LADA, difficult to control, complicated by severe anxiety and depression.  She is on a basal-bolus insulin regimen and also metformin.  We continued to adjust her insulin doses since last visit and, per review of her detailed log, in the last 2 months the CBGs have become more stable.  In fact, even though they are not at goal, these have been the most stable his sugars I have seen for her in a long time.  She does not have many extreme blood sugars in the 300s or in the 30s.  I congratulated her for this.  She is keeping a log of not only her sugars, but on so her foods, and she tells me that she started to sprinkle Chia seeds and flaxseeds on all of her meals.  I suspect that this is helping with the CBG fluctuations.  I strongly advised her to continue. -I do not feel we need to change her insulin doses for now -She continues to do a great job with her sugar log, checking sugars 4 times a day -At last visit, I advised her to move NovoLog at the start of the meal, rather than before the meal, since she has gastroparesis.  We also discussed about the potential need to take NovoLog after the meal.  -We also discussed that Evaristo Buryresiba would give her less fluctuating blood sugars so we will send a preauthorization for this to her insurance.  However, if this is not covered, I think for now we can continue with Lantus.  Of note, Toujeo was not covered for her. - I suggested to:  Patient Instructions  Please continue: - Metformin 500 mg x2 at night - Lantus 30 units at night - NovoLog: 8-10 units  before each meal - Novolog Sliding scale: 150-200: + 1 unit 201-250: + 2  units 251-300: + 3 units >301: + 4 units  Try to take Novolog at the start of the meal.  Please come back in 3 months.  - today we will check a fructosamine level - continue checking sugars at different times of the day - check 3-4x a day, rotating checks - advised for yearly eye exams >> she is UTD - Refuses flu shot today - " I got sick in the past after taking it" - Return to clinic in 3 mo with sugar log   2. PN  -2/2 DM -stable, on neurontin >> no SEs  Office Visit on 09/12/2017  Component Date Value Ref Range Status  . Fructosamine 09/12/2017 346* 190 - 270 umol/L Final   HbA1c  Calculated from the fructosamine is 7.5%, improved from before.  Carlus Pavlovristina Leela Vanbrocklin, MD PhD [facility]

## 2017-09-13 ENCOUNTER — Other Ambulatory Visit: Payer: Self-pay | Admitting: Internal Medicine

## 2017-09-13 LAB — FRUCTOSAMINE: FRUCTOSAMINE: 346 umol/L — AB (ref 190–270)

## 2017-09-16 ENCOUNTER — Telehealth: Payer: Self-pay | Admitting: Internal Medicine

## 2017-09-16 ENCOUNTER — Telehealth: Payer: Self-pay

## 2017-09-16 ENCOUNTER — Other Ambulatory Visit (HOSPITAL_COMMUNITY): Payer: Self-pay

## 2017-09-16 MED ORDER — LAMOTRIGINE 25 MG PO TABS: ORAL_TABLET | ORAL | 0 refills | Status: DC

## 2017-09-16 NOTE — Telephone Encounter (Signed)
[phone] (home)   Patient notified of message and expressed understand.

## 2017-09-16 NOTE — Telephone Encounter (Signed)
Ref # [id number]

## 2017-09-16 NOTE — Telephone Encounter (Signed)
Notes recorded by Carlus PavlovGherghe, Cristina, MD on 09/16/2017 at 7:54 AM EDT [name], can you please call pt: HbA1c Calculated from the fructosamine is 7.5%, improved from before.

## 2017-09-16 NOTE — Telephone Encounter (Signed)
[facility] called the pt and the lantus rx is written for 30 day supply and they are asking if we can increase it to 90 days to help her with cost

## 2017-09-19 ENCOUNTER — Other Ambulatory Visit: Payer: Self-pay | Admitting: Physician Assistant

## 2017-09-27 ENCOUNTER — Telehealth: Payer: Self-pay | Admitting: Internal Medicine

## 2017-09-27 NOTE — Telephone Encounter (Signed)
Left message for patient to return our call at [phone].  

## 2017-09-27 NOTE — Telephone Encounter (Signed)
Pt calling   Have questions about her lancet device that stick her for the blood reading. Please call pt

## 2017-09-30 NOTE — Telephone Encounter (Signed)
LM for patient

## 2017-10-01 ENCOUNTER — Telehealth: Payer: Self-pay | Admitting: Internal Medicine

## 2017-10-01 ENCOUNTER — Other Ambulatory Visit: Payer: Self-pay

## 2017-10-01 MED ORDER — FREESTYLE LANCETS MISC: 1 refills | Status: DC

## 2017-10-01 NOTE — Telephone Encounter (Signed)
error 

## 2017-10-02 NOTE — Telephone Encounter (Signed)
Unable to contact

## 2017-10-04 ENCOUNTER — Ambulatory Visit (HOSPITAL_COMMUNITY): Payer: Self-pay | Admitting: Psychiatry

## 2017-10-15 ENCOUNTER — Telehealth: Payer: Self-pay | Admitting: Gastroenterology

## 2017-10-15 ENCOUNTER — Telehealth: Payer: Self-pay | Admitting: Internal Medicine

## 2017-10-15 NOTE — Telephone Encounter (Signed)
Patient has called and forgotten the name of her RX and needs it refilled. Please Advise. Ph # [phone]

## 2017-10-17 ENCOUNTER — Telehealth: Payer: Self-pay | Admitting: Internal Medicine

## 2017-10-17 MED ORDER — FREESTYLE LANCETS MISC: 1 refills | Status: DC

## 2017-10-17 NOTE — Telephone Encounter (Signed)
Patient needs RX for Lancets (FREESTYLE) lancets sent to [facility]. Patient had called prior but heard nothing back.

## 2017-10-17 NOTE — Telephone Encounter (Signed)
Patient reports she gets these "really bad spells about 3 times a month." She gets relief with Alka-Seltzer. Suggested she try Gaviscon. Reviewed how to take her Prilosec 40 mg BID, anti-reflux diet and measures.

## 2017-10-17 NOTE — Telephone Encounter (Signed)
RX sent

## 2017-10-23 ENCOUNTER — Other Ambulatory Visit (HOSPITAL_COMMUNITY): Payer: Self-pay

## 2017-11-05 NOTE — Telephone Encounter (Signed)
Pt called wondering if the lancets had been called in I let her know they were sent to [facility]

## 2017-11-12 ENCOUNTER — Encounter (HOSPITAL_COMMUNITY): Payer: Self-pay | Admitting: Psychiatry

## 2017-11-12 ENCOUNTER — Ambulatory Visit (INDEPENDENT_AMBULATORY_CARE_PROVIDER_SITE_OTHER): Admitting: Psychiatry

## 2017-11-12 VITALS — BP 150/88 | HR 104 | Ht 66.0 in | Wt 172.0 lb

## 2017-11-12 DIAGNOSIS — F341 Dysthymic disorder: Secondary | ICD-10-CM

## 2017-11-12 MED ORDER — DESIPRAMINE HCL 25 MG PO TABS: ORAL_TABLET | ORAL | 3 refills | Status: DC

## 2017-11-12 MED ORDER — HYDROXYZINE HCL 25 MG PO TABS: ORAL_TABLET | ORAL | 1 refills | Status: DC

## 2017-11-12 MED ORDER — TRAZODONE HCL 100 MG PO TABS: ORAL_TABLET | ORAL | 1 refills | Status: DC

## 2017-11-12 NOTE — Progress Notes (Signed)
Psychiatric Initial Adult Assessment   Patient Identification: [name] MRN:  [id number] Date of Evaluation:  11/12/2017 Referral Source: transfer from Dr Suella Broad Chief Complaint:    Visit Diagnosis:  No diagnosis found.  History of Present Illness  At this time the patient has her baseline.  His chronic physical complaints.  She recently had an injection done in October.  The patient has fibromyalgia but she says her worst problem is irritable bowel syndrome.  My view is her worst problem is that of diabetes.  She is insulin-dependent diabetes.  Patient has a denies daily depression.  1 of her biggest physical/psychiatric complaints is poor sleep.  Her anxiety is significant.  Her husband is doing okay.  Her family seems to be relatively stable.  Her biggest complaint is physical problems.  The patient never desipramine blood level as I asked her to get.  Patient takes Vistaril 25 mg 2 in the morning which does not seem to make much of a difference patient denies any psychotic symptoms she denies use of alcohol or drugs.  She denies chest pain shortness of breath.  He really denies any neurological symptoms at this time. Depression Symptoms:  depressed mood, (Hypo) Manic Symptoms:   Anxiety Symptoms:  Excessive Worry, Psychotic Symptoms:   PTSD Symptoms:   Past Psychiatric History: 2 psychiatric hospitalizations, under the care of Dr.Cunningham under the care of Dr. Suella Broad  Previous Psychotropic Medications: Yes   Substance Abuse History in the last 12 months:  Yes.    Consequences of Substance Abuse: NA  Past Medical History:  Past Medical History:  Diagnosis Date  . Anxiety   . Bipolar 1 disorder (HCC)   . Breast density 06/25/2012   Right breast density, will get mammogram and [place]  . Depression   . Diabetes mellitus without complication (HCC)   . Duodenitis   . HTN (hypertension)   . Hyperlipidemia   . IBS (irritable bowel syndrome)     Past Surgical History:   Procedure Laterality Date  . ABDOMINAL HYSTERECTOMY    . CHOLECYSTECTOMY    . COLONOSCOPY      Family Psychiatric History:   Family History:  Family History  Problem Relation Age of Onset  . Hypertension Mother   . Bipolar disorder Mother   . Cancer Mother   . Heart disease Brother   . Thyroid disease Sister   . Hypertension Sister   . Bipolar disorder Sister   . Depression Father   . Cancer Father   . Bipolar disorder Daughter   . Bipolar disorder Maternal Aunt   . Colon cancer Neg Hx   . Colon polyps Neg Hx   . Esophageal cancer Neg Hx   . Kidney disease Neg Hx   . Gallbladder disease Neg Hx   . Diabetes Neg Hx   . Dementia Neg Hx     Social History:   Social History   Socioeconomic History  . Marital status: Married    Spouse name: phillip  . Number of children: 1  . Years of education: 60  . Highest education level: High school graduate  Occupational History  . Occupation: Disabled  Social Needs  . Financial resource strain: Not hard at all  . Food insecurity:    Worry: Never true    Inability: Never true  . Transportation needs:    Medical: Yes    Non-medical: Yes  Tobacco Use  . Smoking status: Never Smoker  . Smokeless tobacco: Never Used  Substance  and Sexual Activity  . Alcohol use: No    Alcohol/week: 0.0 standard drinks  . Drug use: No  . Sexual activity: Not Currently  Lifestyle  . Physical activity:    Days per week: 0 days    Minutes per session: 0 min  . Stress: Not on file  Relationships  . Social connections:    Talks on phone: Never    Gets together: Never    Attends religious service: More than 4 times per year    Active member of club or organization: No    Attends meetings of clubs or organizations: Never    Relationship status: Married  Other Topics Concern  . Not on file  Social History Narrative   Lives at home w/ her husband   Left-handed   Caffeine: 1-2 cups of coffee daily    Additional Social History:    Allergies:   Allergies  Allergen Reactions  . Abilify [Aripiprazole]     Patient is intolerant  . Reglan [Metoclopramide] Other (See Comments)    Chest pains    Metabolic Disorder Labs: Lab Results  Component Value Date   HGBA1C 5.6 11/07/2016   MPG 85 07/16/2013   MPG 91 04/15/2012   No results found for: PROLACTIN Lab Results  Component Value Date   CHOL 187 11/07/2016   TRIG 113.0 11/07/2016   HDL 50.70 11/07/2016   CHOLHDL 4 11/07/2016   VLDL 22.6 11/07/2016   LDLCALC 113 (H) 11/07/2016   LDLCALC 133 (H) 08/24/2015     Current Medications: Current Outpatient Medications  Medication Sig Dispense Refill  . ALPRAZolam (XANAX) 0.5 MG tablet TK 1 T PO TID PRN FOR 30 DAYS  2  . AMBULATORY NON FORMULARY MEDICATION Squatty Potty x 1 1 each 0  . amLODipine (NORVASC) 5 MG tablet Take 5 mg by mouth daily.    . BD PEN NEEDLE NANO U/F 32G X 4 MM MISC USE WITH LANTUS AND NOVOLOG PENS 200 each 4  . BENICAR 40 MG tablet     . desipramine (NORPRAMIN) 25 MG tablet 3  qam 90 tablet 3  . FREESTYLE LITE test strip USE TO CHECK BLOOD SUGAR 6 TIMES PER DAY 600 each 2  . gabapentin (NEURONTIN) 800 MG tablet Take 800 mg by mouth. 1 tablet in the morning and 2 tablets at bedtime.    Marland Kitchen. glucagon (GLUCAGON EMERGENCY) 1 MG injection Inject 1 mg into the muscle once as needed. 1 each 12  . hydrOXYzine (ATARAX/VISTARIL) 25 MG tablet 2  bid 360 tablet 1  . insulin aspart (NOVOLOG FLEXPEN) 100 UNIT/ML FlexPen INJECT UP TO 40 UNITS DAILY, ALSO INCLUDING SLIDING SCALE 45 mL 3  . Insulin Syringes, Disposable, U-100 1 ML MISC To use for insulin injection.. 100 each 1  . Lancets (FREESTYLE) lancets Use to test blood sugar 4 to 6 times daily as instructed. 300 each 1  . LANTUS SOLOSTAR 100 UNIT/ML Solostar Pen INJECT 30 UNITS UNDER THE SKIN DAILY (DISCONTINUE TRESIBA AND TOUJEO) 15 mL 7  . metFORMIN (GLUCOPHAGE) 500 MG tablet TAKE 1 TABLET TWICE A DAY WITH MEALS 180 tablet 11  . omeprazole (PRILOSEC)  40 MG capsule Take 1 capsule (40 mg total) by mouth 2 (two) times daily. 180 capsule 3  . ondansetron (ZOFRAN-ODT) 4 MG disintegrating tablet PLACE 1 TO 2 TABLETS ON TONGUE EVERY 4 TO 6 HOURS AS NEEDED FOR NAUSEA 180 tablet 4  . OSCIMIN 0.125 MG SUBL DISSOLVE 1 TABLET UNDER THE TONGUE EVERY 4  HOURS AS NEEDED 120 each 3  . insulin degludec (TRESIBA FLEXTOUCH) 100 UNIT/ML SOPN FlexTouch Pen Inject 30 units into the skin daily. (Patient not taking: Reported on 11/12/2017) 15 mL 2  . lamoTRIgine (LAMICTAL) 25 MG tablet 1  qhs (Patient not taking: Reported on 11/12/2017) 14 tablet 0  . traZODone (DESYREL) 100 MG tablet 1  qhs  May increse to 2  qhs  After 1 week 360 tablet 1   No current facility-administered medications for this visit.     Neurologic: Headache: No Seizure: No Paresthesias:No  Musculoskeletal: Strength & Muscle Tone: within normal limits Gait & Station: normal Patient leans: N/A  Psychiatric Specialty Exam: ROS  Blood pressure (!) 150/88, pulse (!) 104, height 5\' 6"  (1.676 m), weight 172 lb (78 kg).Body mass index is 27.76 kg/m.  General Appearance: Casual  Eye Contact:  Good  Speech:  Clear and Coherent  Volume:  Normal  Mood:  Anxious  Affect:  Appropriate  Thought Process:  Goal Directed  Orientation:  NA  Thought Content:  WDL  Suicidal Thoughts:  No  Homicidal Thoughts:  No  Memory:  Negative  Judgement:  Good  Insight:  Fair  Psychomotor Activity:  NA and Normal  Concentration:    Recall:  Fair  Fund of Knowledge:Good  Language: Good  Akathisia:  No  Handed:  Right  AIMS (if indicated):    Assets:  Desire for Improvement  ADL's:  Intact  Cognition: WNL  Sleep:      Treatment Plan Summary:  At this time the patient will be scheduled to get a desipramine level in the next few weeks.  Presently she takes 75 mg of the desipramine which we will continue at this time.  When she returns in approximately 2 months if her disagree level is low I would  consider raising it.  I think this patient has chronic mild depression.  She has associated anxiety.  Her first problem is that of dysthymic disorder her second problem is an adjustment disorder with an anxious mood state.  At this time we will increase her Vistaril to taking 25 mg 2 in the morning and 2 at night.  The patient will return to see me 2 months.  The patient is not suicidal.  She is functioning only fairly well. Gypsy Balsam, MD 11/12/20193:27 PM

## 2017-11-22 DIAGNOSIS — E1165 Type 2 diabetes mellitus with hyperglycemia: Secondary | ICD-10-CM | POA: Diagnosis not present

## 2017-11-27 ENCOUNTER — Ambulatory Visit (HOSPITAL_COMMUNITY): Admitting: Psychiatry

## 2017-12-13 ENCOUNTER — Ambulatory Visit (HOSPITAL_COMMUNITY): Admitting: Psychiatry

## 2017-12-13 ENCOUNTER — Other Ambulatory Visit: Payer: Self-pay

## 2017-12-13 MED ORDER — METFORMIN HCL 500 MG PO TABS: 500.0000 mg | ORAL_TABLET | Freq: Two times a day (BID) | ORAL | 11 refills | Status: DC

## 2017-12-17 ENCOUNTER — Telehealth (HOSPITAL_COMMUNITY): Payer: Self-pay

## 2017-12-17 NOTE — Telephone Encounter (Signed)
Patient is calling with concerns about the Hydroxyzine. Patient states her anxiety is not getting any better and the Hydroxyzine is messing with her IBS. Please review and advise, thank you

## 2017-12-19 ENCOUNTER — Ambulatory Visit (INDEPENDENT_AMBULATORY_CARE_PROVIDER_SITE_OTHER): Admitting: Internal Medicine

## 2017-12-19 ENCOUNTER — Encounter: Payer: Self-pay | Admitting: Internal Medicine

## 2017-12-19 VITALS — BP 142/68 | HR 95 | Ht 65.0 in | Wt 171.8 lb

## 2017-12-19 DIAGNOSIS — E139 Other specified diabetes mellitus without complications: Secondary | ICD-10-CM | POA: Diagnosis not present

## 2017-12-19 LAB — POCT GLYCOSYLATED HEMOGLOBIN (HGB A1C): Hemoglobin A1C: 6 % — AB (ref 4.0–5.6)

## 2017-12-19 LAB — LIPID PANEL
Cholesterol: 152 mg/dL (ref 0–200)
HDL: 41.3 mg/dL
LDL Cholesterol: 77 mg/dL (ref 0–99)
NonHDL: 110.75
Total CHOL/HDL Ratio: 4
Triglycerides: 171 mg/dL — ABNORMAL HIGH (ref 0.0–149.0)
VLDL: 34.2 mg/dL (ref 0.0–40.0)

## 2017-12-19 NOTE — Patient Instructions (Signed)
Please continue: - Metformin 500 mg x2 at night - Lantus 34 units at bedtime - NovoLog:  10 units in am 6 units with lunch 10 units in dinner  - Novolog Sliding scale: 150-200: + 1 unit 201-250: + 2 units 251-300: + 3 units 301-350: + 4 units >350: + 5 units  Can try the following combination for neuropathy: - alpha-lipoic acid 600 mg 2x a day  Please return in 3-4 months with your sugar log.

## 2017-12-19 NOTE — Progress Notes (Signed)
Patient ID: [name], female   DOB: 08/07/1955, 62 y.o.   MRN: [id number]  HPI: [name] is a 62 y.o.-year-old female, initially referred by her PCP, Dr. Assunta FoundJohn [name] (PA: Lenise HeraldBenjamin [name]), returning for f/u for LADA, dx 2005, insulin-dependent since ~2006, controlled, with complications (PN, gastroparesis?). Last visit 3 months ago.  Her diabetes management is limited by her anxiety/panic attacks.. Sugars are very high when stressed..  She is seeing Dr. Donell [name].  On Imipramine and Lamotrigine. She feels calmer now as she started to pray more.  She lately started to improve her diet (for example replaced a day dose with cauliflower) and limiting portions.  She is also cutting down sweets.  She feels that her sugars are less fluctuating since she started his diet.    Reviewed history: She was on an Omnipod insulin pump on 08/01/2015. She had severe anxiety and depression and got so overwhelmed with the insulin pump that we had to take her off the pump.   We switched her to a basal-bolus insulin regimen, but she was unable to calculate the insulin doses based on insulin to carb ratio, so now she is using fixed rapid acting insulin doses.  Previous fructosamine levels: 09/12/2017: HbA1c  Calculated from the fructosamine is 7.5%, improved from before. 05/07/2017: HbA1c calculated from the fructosamine was 7.7% 02/07/2017: HbA1c calculated from the fructosamine was higher, at 7.7%. 11/07/2016: HbA1c calc. from the fructosamine is a little lower, at 7.25% 08/07/2016: HbA1c calculated from the fructosamine is 7.5% 05/31/2016: HbA1c calculated from fructosamine is better, at 7.25% 12/15/2015: HbA1c calculated from the fructosamine is higher, 8.4%. 08/24/2015:  HbA1c calculated from fructosamine is 7.4%. 05/24/2015: HbA1c calculated from fructosamine is 6.9%. ... [name] Results  Component Value Date   HGBA1C 6.0 (A) 12/19/2017   HGBA1C 5.6 11/07/2016   HGBA1C 4.6 07/16/2013   She is on: -  Metformin 500 mg x2 at night - Lantus 34 units at bedtime - NovoLog:  10 units in am 6 units with lunch 10 units in dinner  - Novolog Sliding scale: 150-200: + 1 unit 201-250: + 2 units 251-300: + 3 units 301-350: + 4 units >350: + 5 units   Pt checks her sugars 4x a day - reviewed her detailed log: - am:  66, 128-229, 254 >> 108-272 >> 116-228, 255 >> 50, 78-254, 286 - 2h after b'fast: 98 >> 342 x1 >> n/c - before lunch:47, 71-259, 331 >> 43, 61-206, 251, 308 >> 42, 59-207, 220, 273 - 2h after lunch: 83 >> 32 x1 >> n/c >> 128 >> n/c >> 63, 121 - before dinner: 32, 59, 82-273 >> 84-230, 290, 316 >> 50, 53, 78-229, 288 - 2h after dinner: 211-365 >> 162 >> n/c - bedtime:62, 80, 103-226, 240 >> 38, 46, 56-220, 245 >> 82-238, 250 >> 60-185, 202 - at night: 116 >> 82, 250 >> 90 >> n/c Lowest sugar was 38 >> 43 >> 42; she has hypoglycemia awareness in the 50s. Highest sugar was 331 >> 316 (in last 2 months) >> 288  -No CKD, last BUN/creatinine:  [name] Results  Component Value Date   BUN 14 11/07/2016   CREATININE 0.58 11/07/2016   Normal ACR: [name] Results  Component Value Date   MICRALBCREAT 5.4 11/07/2016    -+HL; lipids: [name] Results  Component Value Date   CHOL 187 11/07/2016   HDL 50.70 11/07/2016   LDLCALC 113 (H) 11/07/2016   TRIG 113.0 11/07/2016   CHOLHDL 4 11/07/2016  Not  a statin.  -Last eye exam was in [place] in 11/2017: No DR, + small cataract- [facility] Dr: [name].  - + Numbness and tingling in her feet.  On Neurontin.  She does have some pain in her feet. She is seeing a podiatrist [facility]).  2 toenails removed due to fungal infection. She had a blister - healed.  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 She also has a dx of Parkinson ds.  And bipolar disease. Also: GERD, HTN, Fibromyalgia >> on Neurontin.  Last TSH -normal: [name] Results  Component Value Date   TSH 0.57 11/07/2016   ROS: Constitutional: + weight gain/no  weight loss, + fatigue, + subjective hyperthermia, no subjective hypothermia, + nocturia Eyes: + blurry vision, no xerophthalmia ENT: no sore throat, no nodules palpated in neck, + dysphagia, no odynophagia, no hoarseness Cardiovascular: no CP/no SOB/no palpitations/no leg swelling Respiratory: no cough/no SOB/no wheezing Gastrointestinal: + N/no V/no D/+ C/+ acid reflux Musculoskeletal: + muscle aches/no joint aches Skin: no rashes, no hair loss Neurological: no tremors/no numbness/no tingling/no dizziness  I reviewed pt's medications, allergies, PMH, social hx, family hx, and changes were documented in the history of present illness. Otherwise, unchanged from my initial visit note.   Past Medical History:  Diagnosis Date  . Anxiety   . Bipolar 1 disorder (HCC)   . Breast density 06/25/2012   Right breast density, will get mammogram and [place]  . Depression   . Diabetes mellitus without complication (HCC)   . Duodenitis   . HTN (hypertension)   . Hyperlipidemia   . IBS (irritable bowel syndrome)    Past Surgical History:  Procedure Laterality Date  . ABDOMINAL HYSTERECTOMY    . CHOLECYSTECTOMY    . COLONOSCOPY     Social History   Socioeconomic History  . Marital status: Married    Spouse name: phillip  . Number of children: 1  . Years of education: 28  . Highest education level: High school graduate  Occupational History  . Occupation: Disabled  Social Needs  . Financial resource strain: Not hard at all  . Food insecurity:    Worry: Never true    Inability: Never true  . Transportation needs:    Medical: Yes    Non-medical: Yes  Tobacco Use  . Smoking status: Never Smoker  . Smokeless tobacco: Never Used  Substance and Sexual Activity  . Alcohol use: No    Alcohol/week: 0.0 standard drinks  . Drug use: No  . Sexual activity: Not Currently  Lifestyle  . Physical activity:    Days per week: 0 days    Minutes per session: 0 min  . Stress: Not on file   Relationships  . Social connections:    Talks on phone: Never    Gets together: Never    Attends religious service: More than 4 times per year    Active member of club or organization: No    Attends meetings of clubs or organizations: Never    Relationship status: Married  . Intimate partner violence:    Fear of current or ex partner: No    Emotionally abused: No    Physically abused: No    Forced sexual activity: No  Other Topics Concern  . Not on file  Social History Narrative   Lives at home w/ her husband   Left-handed   Caffeine: 1-2 cups of coffee daily   Current Outpatient Medications on File Prior to Visit  Medication Sig Dispense Refill  . ALPRAZolam (XANAX) 0.5 MG tablet TK 1 T PO TID PRN FOR 30 DAYS  2  . AMBULATORY NON FORMULARY MEDICATION Squatty Potty x 1 1 each 0  . amLODipine (NORVASC) 5 MG tablet Take 5 mg by mouth daily.    . BD PEN NEEDLE NANO U/F 32G X 4 MM MISC USE WITH LANTUS AND NOVOLOG PENS 200 each 4  . BENICAR 40 MG tablet     . desipramine (NORPRAMIN) 25 MG tablet 3  qam 90 tablet 3  . FREESTYLE LITE test strip USE TO CHECK BLOOD SUGAR 6 TIMES PER DAY 600 each 2  . gabapentin (NEURONTIN) 800 MG tablet Take 800 mg by mouth. 1 tablet in the morning and 2 tablets at bedtime.    Marland Kitchen glucagon (GLUCAGON EMERGENCY) 1 MG injection Inject 1 mg into the muscle once as needed. 1 each 12  . hydrOXYzine (ATARAX/VISTARIL) 25 MG tablet 2  bid 360 tablet 1  . insulin aspart (NOVOLOG FLEXPEN) 100 UNIT/ML FlexPen INJECT UP TO 40 UNITS DAILY, ALSO INCLUDING SLIDING SCALE 45 mL 3  . Insulin Syringes, Disposable, U-100 1 ML MISC To use for insulin injection.. 100 each 1  . Lancets (FREESTYLE) lancets Use to test blood sugar 4 to 6 times daily as instructed. 300 each 1  . LANTUS SOLOSTAR 100 UNIT/ML Solostar Pen INJECT 30 UNITS UNDER THE SKIN DAILY (DISCONTINUE TRESIBA AND TOUJEO) 15 mL 7  . metFORMIN (GLUCOPHAGE) 500 MG tablet Take 1 tablet (500 mg total) by mouth 2 (two)  times daily with a meal. 180 tablet 11  . omeprazole (PRILOSEC) 40 MG capsule Take 1 capsule (40 mg total) by mouth 2 (two) times daily. 180 capsule 3  . ondansetron (ZOFRAN-ODT) 4 MG disintegrating tablet PLACE 1 TO 2 TABLETS ON TONGUE EVERY 4 TO 6 HOURS AS NEEDED FOR NAUSEA 180 tablet 4  . OSCIMIN 0.125 MG SUBL DISSOLVE 1 TABLET UNDER THE TONGUE EVERY 4 HOURS AS NEEDED 120 each 3  . traZODone (DESYREL) 100 MG tablet 1  qhs  May increse to 2  qhs  After 1 week 360 tablet 1  . insulin degludec (TRESIBA FLEXTOUCH) 100 UNIT/ML SOPN FlexTouch Pen Inject 30 units into the skin daily. (Patient not taking: Reported on 11/12/2017) 15 mL 2  . lamoTRIgine (LAMICTAL) 25 MG tablet 1  qhs (Patient not taking: Reported on 11/12/2017) 14 tablet 0   No current facility-administered medications on file prior to visit.    Allergies  Allergen Reactions  . Abilify [Aripiprazole]     Patient is intolerant  . Reglan [Metoclopramide] Other (See Comments)    Chest pains   Family History  Problem Relation Age of Onset  . Hypertension Mother   . Bipolar disorder Mother   . Cancer Mother   . Heart disease Brother   . Thyroid disease Sister   . Hypertension Sister   . Bipolar disorder Sister   . Depression Father   . Cancer Father   . Bipolar disorder Daughter   . Bipolar disorder Maternal Aunt   . Colon cancer Neg Hx   . Colon polyps Neg Hx   . Esophageal cancer Neg Hx   . Kidney disease Neg Hx   . Gallbladder disease Neg Hx   . Diabetes Neg Hx   . Dementia Neg Hx     PE: BP (!) 142/68 (BP Location: Right Arm, Patient Position: Sitting, Cuff Size: Normal)   Pulse 95   Ht 5\' 5"  (  1.651 m)   Wt 171 lb 12.8 oz (77.9 kg)   SpO2 98%   BMI 28.59 kg/m    Wt Readings from Last 3 Encounters:  12/19/17 171 lb 12.8 oz (77.9 kg)  09/12/17 169 lb 3.2 oz (76.7 kg)  08/06/17 168 lb 3.2 oz (76.3 kg)   Constitutional: overweight, in NAD Eyes: PERRLA, EOMI, no exophthalmos ENT: moist mucous membranes, no  thyromegaly, no cervical lymphadenopathy Cardiovascular: Tachycardia, RR, No MRG Respiratory: CTA B Gastrointestinal: abdomen soft, NT, ND, BS+ Musculoskeletal: no deformities, strength intact in all 4 Skin: moist, warm, no rashes Neurological: + Tremor with outstretched hands, DTR normal in all 4  ASSESSMENT: 1. LADA, insulin-dependent, uncontrolled, with complications - peripheral neuropathy - gastroparesis?  Component     Latest Ref Rng 07/16/2013  C-Peptide     0.80 - 3.90 ng/mL 1.29  Glucose     70 - 99 mg/dL 914 (H)  Glutamic Acid Decarb Ab     <=1.0 U/mL 11.3 (H)  Pancreatic Islet Cell Antibody     <5 JDF Units 5 (A)  + anti-pancreatic antibodies >> LADA rather than type 2 DM. She still has a positive C peptide >> still has insulin secretion. For this reason, we continued the Metformin.  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 HL  PLAN:  1.  Patient with complex complex letter, difficult to control, complicated by severe anxiety and depression.  She is on a basal-bolus insulin regimen and also metformin.  She has very fluctuating blood sugars which improved after she changed her diet, but remain quite variable, from 40s- upper 200s -Compared to last visit, by review of her log, sugars are similar before meals, however, she has more sugars close to target in the morning and her sugars at bedtime improved significantly -She continues to do a great job checking her sugars 4 times a day -She uses 6 to 10 units of insulin per meal and she also does a good job adding sliding scale.  She is injecting at the start of the meal rather than 15 minutes before due to her gastroparesis.  She does mention more GI symptoms recently -For now, I suggested to continue the current regimen -We tried [place] in the past for her but this was not covered.  Toujeo was also not covered. - I suggested to:  Patient Instructions  Please continue: - Metformin 500 mg x2 at night - Lantus 34 units at bedtime - NovoLog:   10 units in am 6 units with lunch 10 units in dinner  - Novolog Sliding scale: 150-200: + 1 unit 201-250: + 2 units 251-300: + 3 units 301-350: + 4 units >350: + 5 units  Can try the following combination for neuropathy: - alpha-lipoic acid 600 mg 2x a day  Please return in 3-4 months with your sugar log.   - today, HbA1c is 6% - higher compared to previous HbA1c levels, however, fructosamine is more accurate for her-we will check today - continue checking sugars at different times of the day - check 4x a day, rotating checks - advised for yearly eye exams >> she is UTD - She had a flu shot recently, and again she got sick after it - We will also check a CMP and a lipid panel today - Return to clinic in 3 mo with sugar log    2. PN  -2/2 DM -She continues on Neurontin without side effects -However, she had more pain lately.  I recommended alpha lipoic acid  600 mg twice a day.  We discussed that this may improve her sugars, also  3. HL - Reviewed latest lipid panel from a year ago: LDL above goal, the rest of the fractions normal [name] Results  Component Value Date   CHOL 187 11/07/2016   HDL 50.70 11/07/2016   LDLCALC 113 (H) 11/07/2016   TRIG 113.0 11/07/2016   CHOLHDL 4 11/07/2016  -She is not on a statin -We will check another lipid panel today  Office Visit on 12/19/2017  Component Date Value Ref Range Status  . Hemoglobin A1C 12/19/2017 6.0* 4.0 - 5.6 % Final  . Glucose, Bld 12/19/2017 247* 65 - 99 mg/dL Final   Comment: .            Fasting reference interval . For someone without known diabetes, a glucose value >125 mg/dL indicates that they may have diabetes and this should be confirmed with a follow-up test. .   . BUN 12/19/2017 17  7 - 25 mg/dL Final  . Creat 16/10/9602 0.75  0.50 - 0.99 mg/dL Final   Comment: For patients >62 years of age, the reference limit for Creatinine is approximately 13% higher for people identified as African-American. .    . GFR, Est Non African American 12/19/2017 84  > OR = 60 mL/min/1.38m2 Final  . GFR, Est African American 12/19/2017 98  > OR = 60 mL/min/1.49m2 Final  . BUN/Creatinine Ratio 12/19/2017 NOT APPLICABLE  6 - 22 (calc) Final  . Sodium 12/19/2017 139  135 - 146 mmol/L Final  . Potassium 12/19/2017 4.2  3.5 - 5.3 mmol/L Final  . Chloride 12/19/2017 102  98 - 110 mmol/L Final  . CO2 12/19/2017 24  20 - 32 mmol/L Final  . Calcium 12/19/2017 9.6  8.6 - 10.4 mg/dL Final  . Total Protein 12/19/2017 6.4  6.1 - 8.1 g/dL Final  . Albumin 54/09/8117 4.2  3.6 - 5.1 g/dL Final  . Globulin 14/78/2956 2.2  1.9 - 3.7 g/dL (calc) Final  . AG Ratio 12/19/2017 1.9  1.0 - 2.5 (calc) Final  . Total Bilirubin 12/19/2017 0.2  0.2 - 1.2 mg/dL Final  . Alkaline phosphatase (APISO) 12/19/2017 92  33 - 130 U/L Final  . AST 12/19/2017 11  10 - 35 U/L Final  . ALT 12/19/2017 12  6 - 29 U/L Final  . Cholesterol 12/19/2017 152  0 - 200 mg/dL Final   ATP III Classification       Desirable:  < 200 mg/dL               Borderline High:  200 - 239 mg/dL          High:  > = 213 mg/dL  . Triglycerides 12/19/2017 171.0* 0.0 - 149.0 mg/dL Final   Normal:  <086 mg/dLBorderline High:  150 - 199 mg/dL  . HDL 12/19/2017 41.30  >39.00 mg/dL Final  . VLDL 57/84/6962 34.2  0.0 - 40.0 mg/dL Final  . LDL Cholesterol 12/19/2017 77  0 - 99 mg/dL Final  . Total CHOL/HDL Ratio 12/19/2017 4   Final                  Men          Women1/2 Average Risk     3.4          3.3Average Risk          5.0          4.42X Average Risk  9.6          7.13X Average Risk          15.0          11.0                      . NonHDL 12/19/2017 110.75   Final   NOTE:  Non-HDL goal should be 30 mg/dL higher than patient's LDL goal (i.e. LDL goal of < 70 mg/dL, would have non-HDL goal of < 100 mg/dL)  . Fructosamine 12/19/2017 324* 205 - 285 umol/L Final   Glu high. LDL improved. HbA1c calculated from fructosamine has improved to 7.1%!  Carlus Pavlovristina Tiffanny Lamarche,  MD PhD [facility]

## 2017-12-20 MED ORDER — BUSPIRONE HCL 10 MG PO TABS: 10.0000 mg | ORAL_TABLET | Freq: Two times a day (BID) | ORAL | 0 refills | Status: DC

## 2017-12-20 NOTE — Telephone Encounter (Signed)
I called patient and she is agreeable to this plan, rx sent to pharmacy

## 2017-12-23 LAB — COMPLETE METABOLIC PANEL WITH GFR
AG Ratio: 1.9 (calc) (ref 1.0–2.5)
ALBUMIN MSPROF: 4.2 g/dL (ref 3.6–5.1)
ALKALINE PHOSPHATASE (APISO): 92 U/L (ref 33–130)
ALT: 12 U/L (ref 6–29)
AST: 11 U/L (ref 10–35)
BUN: 17 mg/dL (ref 7–25)
CO2: 24 mmol/L (ref 20–32)
CREATININE: 0.75 mg/dL (ref 0.50–0.99)
Calcium: 9.6 mg/dL (ref 8.6–10.4)
Chloride: 102 mmol/L (ref 98–110)
GFR, EST AFRICAN AMERICAN: 98 mL/min/{1.73_m2} (ref >=60)
GFR, EST NON AFRICAN AMERICAN: 84 mL/min/{1.73_m2} (ref >=60)
GLOBULIN: 2.2 g/dL (ref 1.9–3.7)
GLUCOSE: 247 mg/dL — AB (ref 65–99)
Potassium: 4.2 mmol/L (ref 3.5–5.3)
Sodium: 139 mmol/L (ref 135–146)
TOTAL PROTEIN: 6.4 g/dL (ref 6.1–8.1)
Total Bilirubin: 0.2 mg/dL (ref 0.2–1.2)

## 2017-12-23 LAB — FRUCTOSAMINE: FRUCTOSAMINE: 324 umol/L — AB (ref 205–285)

## 2017-12-26 ENCOUNTER — Telehealth: Payer: Self-pay

## 2017-12-26 NOTE — Telephone Encounter (Signed)
LMTCB

## 2017-12-26 NOTE — Telephone Encounter (Signed)
-----   Message from Carlus Pavlovristina Gherghe, MD sent at 12/26/2017  8:27 AM EST ----- Efraim KaufmannMelissa, can you please call pt: Glu is high, at 247. However, the rest of the labs are good and the HbA1c calculated from fructosamine has improved to 7.1%! Also, her bad cholesterol has improved!

## 2018-01-02 NOTE — Telephone Encounter (Signed)
LMTCB

## 2018-01-03 NOTE — Telephone Encounter (Signed)
LMTCB

## 2018-01-13 ENCOUNTER — Other Ambulatory Visit: Payer: Self-pay | Admitting: *Deleted

## 2018-01-13 ENCOUNTER — Telehealth: Payer: Self-pay | Admitting: Internal Medicine

## 2018-01-13 MED ORDER — OMEPRAZOLE 40 MG PO CPDR: 40.0000 mg | DELAYED_RELEASE_CAPSULE | Freq: Two times a day (BID) | ORAL | 3 refills | Status: DC

## 2018-01-13 NOTE — Telephone Encounter (Signed)
Patient calling for results from  recent labs results "states wants to let Dr Lafe Garin know that in last 4 days has had lows in the 50's"

## 2018-01-13 NOTE — Telephone Encounter (Signed)
Patient states was returning call to our office

## 2018-01-13 NOTE — Telephone Encounter (Signed)
Spoke to pt and went over instructions in previous phone note pt stated an understanding

## 2018-01-13 NOTE — Telephone Encounter (Signed)
Let's decrease long acting insulin from 34 to 30 and may need to go down to 28. Let us know about how she is doing  - need patterns of sugars in few days.

## 2018-01-13 NOTE — Telephone Encounter (Signed)
lft vm for pt to return call 

## 2018-02-11 ENCOUNTER — Telehealth: Payer: Self-pay | Admitting: Internal Medicine

## 2018-02-11 NOTE — Telephone Encounter (Signed)
Patient requests to be called at ph# [phone] to go over her blood sugar levels that have been running low for the last 9 days straight.

## 2018-02-11 NOTE — Telephone Encounter (Signed)
Spoke to patient to get more information, she was not home and did not have her logs so she request I call her tomorrow PM.

## 2018-02-12 NOTE — Telephone Encounter (Signed)
The before dinner sugars are too low >> please advise her to reduce the lunchtime insulin by 2 units and see if she still has the lows.

## 2018-02-12 NOTE — Telephone Encounter (Signed)
Last 3 days of readings  02/9 184 before breakfast; lunch 130; dinner  37; bedtime 282  2/10 breakfast 167; 115 lunch; 60 before dinner; after taking a glucose tab it went up to 95 at dinner; bedtime 169; middle of night 279  2/11 breakfast 217; lunch 115; 180 before dinner; 92 at bedtime

## 2018-02-13 NOTE — Telephone Encounter (Signed)
Spoke with patient, discussed the decrease, she will be bumping her lunchtime dosing down to 4 units and will call us if the dinner results are still too low

## 2018-02-14 ENCOUNTER — Ambulatory Visit (HOSPITAL_COMMUNITY): Admitting: Psychiatry

## 2018-02-14 ENCOUNTER — Encounter

## 2018-02-24 ENCOUNTER — Telehealth: Payer: Self-pay | Admitting: Internal Medicine

## 2018-02-24 NOTE — Telephone Encounter (Signed)
I spoke to patient, she will bring her sugar logs in and drop off for [place] to look at.

## 2018-02-24 NOTE — Telephone Encounter (Signed)
Correction: from 10 units to 8 units. Please ignore the prev. msg.

## 2018-02-24 NOTE — Telephone Encounter (Signed)
Logs given to Dr. Elvera Lennox.  Please advise.

## 2018-02-24 NOTE — Telephone Encounter (Signed)
Notified patient of message from Dr. Gherghe, patient expressed understanding and agreement. No further questions.  

## 2018-02-24 NOTE — Telephone Encounter (Signed)
Please decrease dinnertime insulin from 8 units to 6 units.

## 2018-02-24 NOTE — Telephone Encounter (Signed)
Patient is requesting a call back to give the lastest blood sugar readings. She stated her blood sugars are jumping all over the place and she is not sure what is going on.  Please advise

## 2018-02-25 ENCOUNTER — Emergency Department (HOSPITAL_COMMUNITY)
Admission: EM | Admit: 2018-02-25 | Discharge: 2018-02-25 | Disposition: A | Discharge status: Home / Self Care | Payer: Medicare Other | Attending: Emergency Medicine | Admitting: Emergency Medicine

## 2018-02-25 ENCOUNTER — Other Ambulatory Visit: Payer: Self-pay

## 2018-02-25 ENCOUNTER — Emergency Department (HOSPITAL_COMMUNITY): Payer: Medicare Other

## 2018-02-25 ENCOUNTER — Encounter (HOSPITAL_COMMUNITY): Payer: Self-pay | Admitting: Emergency Medicine

## 2018-02-25 DIAGNOSIS — E119 Type 2 diabetes mellitus without complications: Secondary | ICD-10-CM | POA: Diagnosis not present

## 2018-02-25 DIAGNOSIS — W01198A Fall on same level from slipping, tripping and stumbling with subsequent striking against other object, initial encounter: Secondary | ICD-10-CM | POA: Diagnosis not present

## 2018-02-25 DIAGNOSIS — Y999 Unspecified external cause status: Secondary | ICD-10-CM | POA: Diagnosis not present

## 2018-02-25 DIAGNOSIS — Y92008 Other place in unspecified non-institutional (private) residence as the place of occurrence of the external cause: Secondary | ICD-10-CM | POA: Diagnosis not present

## 2018-02-25 DIAGNOSIS — N390 Urinary tract infection, site not specified: Secondary | ICD-10-CM | POA: Insufficient documentation

## 2018-02-25 DIAGNOSIS — S0990XA Unspecified injury of head, initial encounter: Secondary | ICD-10-CM

## 2018-02-25 DIAGNOSIS — R51 Headache: Secondary | ICD-10-CM | POA: Diagnosis not present

## 2018-02-25 DIAGNOSIS — W19XXXA Unspecified fall, initial encounter: Secondary | ICD-10-CM | POA: Diagnosis not present

## 2018-02-25 DIAGNOSIS — I959 Hypotension, unspecified: Secondary | ICD-10-CM | POA: Diagnosis not present

## 2018-02-25 DIAGNOSIS — Z79899 Other long term (current) drug therapy: Secondary | ICD-10-CM | POA: Diagnosis not present

## 2018-02-25 DIAGNOSIS — Z794 Long term (current) use of insulin: Secondary | ICD-10-CM | POA: Insufficient documentation

## 2018-02-25 DIAGNOSIS — R197 Diarrhea, unspecified: Secondary | ICD-10-CM

## 2018-02-25 DIAGNOSIS — I10 Essential (primary) hypertension: Secondary | ICD-10-CM | POA: Insufficient documentation

## 2018-02-25 DIAGNOSIS — Y9389 Activity, other specified: Secondary | ICD-10-CM | POA: Insufficient documentation

## 2018-02-25 DIAGNOSIS — R0902 Hypoxemia: Secondary | ICD-10-CM | POA: Diagnosis not present

## 2018-02-25 LAB — CBC WITH DIFFERENTIAL/PLATELET
ABS IMMATURE GRANULOCYTES: 0.06 10*3/uL (ref 0.00–0.07)
Basophils Absolute: 0 10*3/uL (ref 0.0–0.1)
Basophils Relative: 0 %
Eosinophils Absolute: 0 10*3/uL (ref 0.0–0.5)
Eosinophils Relative: 0 %
HCT: 37.4 % (ref 36.0–46.0)
HEMOGLOBIN: 10.8 g/dL — AB (ref 12.0–15.0)
Immature Granulocytes: 0 %
LYMPHS ABS: 0.3 10*3/uL — AB (ref 0.7–4.0)
Lymphocytes Relative: 2 %
MCH: 25.4 pg — ABNORMAL LOW (ref 26.0–34.0)
MCHC: 28.9 g/dL — ABNORMAL LOW (ref 30.0–36.0)
MCV: 88 fL (ref 80.0–100.0)
Monocytes Absolute: 1.6 10*3/uL — ABNORMAL HIGH (ref 0.1–1.0)
Monocytes Relative: 12 %
NEUTROS ABS: 11.9 10*3/uL — AB (ref 1.7–7.7)
Neutrophils Relative %: 86 %
Platelets: 191 10*3/uL (ref 150–400)
RBC: 4.25 MIL/uL (ref 3.87–5.11)
RDW: 16.1 % — ABNORMAL HIGH (ref 11.5–15.5)
WBC: 13.9 10*3/uL — ABNORMAL HIGH (ref 4.0–10.5)
nRBC: 0 % (ref 0.0–0.2)

## 2018-02-25 LAB — URINALYSIS, ROUTINE W REFLEX MICROSCOPIC
Bilirubin Urine: NEGATIVE
Glucose, UA: NEGATIVE mg/dL
HGB URINE DIPSTICK: NEGATIVE
Ketones, ur: 5 mg/dL — AB
NITRITE: POSITIVE — AB
Protein, ur: NEGATIVE mg/dL
Specific Gravity, Urine: 1.012 (ref 1.005–1.030)
pH: 5 (ref 5.0–8.0)

## 2018-02-25 LAB — COMPREHENSIVE METABOLIC PANEL
ALT: 17 U/L (ref 0–44)
AST: 20 U/L (ref 15–41)
Albumin: 4.2 g/dL (ref 3.5–5.0)
Alkaline Phosphatase: 74 U/L (ref 38–126)
Anion gap: 7 (ref 5–15)
BUN: 20 mg/dL (ref 8–23)
CO2: 21 mmol/L — ABNORMAL LOW (ref 22–32)
Calcium: 8.3 mg/dL — ABNORMAL LOW (ref 8.9–10.3)
Chloride: 111 mmol/L (ref 98–111)
Creatinine, Ser: 0.76 mg/dL (ref 0.44–1.00)
Glucose, Bld: 170 mg/dL — ABNORMAL HIGH (ref 70–99)
Potassium: 3.8 mmol/L (ref 3.5–5.1)
Sodium: 139 mmol/L (ref 135–145)
Total Bilirubin: 0.7 mg/dL (ref 0.3–1.2)
Total Protein: 6.8 g/dL (ref 6.5–8.1)

## 2018-02-25 LAB — LIPASE, BLOOD: Lipase: 28 U/L (ref 11–51)

## 2018-02-25 MED ORDER — SODIUM CHLORIDE 0.9 % IV SOLN
1.0000 g | Freq: Once | INTRAVENOUS | Status: AC
  Administered 2018-02-25: 1 g via INTRAVENOUS
  Filled 2018-02-25: qty 10

## 2018-02-25 MED ORDER — PROMETHAZINE HCL 25 MG/ML IJ SOLN
12.5000 mg | Freq: Once | INTRAMUSCULAR | Status: AC
Start: 2018-02-25 — End: 2018-02-25
  Administered 2018-02-25: 12.5 mg via INTRAVENOUS
  Filled 2018-02-25: qty 1

## 2018-02-25 MED ORDER — LOPERAMIDE HCL 2 MG PO CAPS
4.0000 mg | ORAL_CAPSULE | Freq: Once | ORAL | Status: AC
  Administered 2018-02-25: 4 mg via ORAL
  Filled 2018-02-25: qty 2

## 2018-02-25 MED ORDER — SODIUM CHLORIDE 0.9% FLUSH
3.0000 mL | Freq: Once | INTRAVENOUS | Status: AC
  Administered 2018-02-25: 3 mL via INTRAVENOUS

## 2018-02-25 MED ORDER — ONDANSETRON HCL 4 MG/2ML IJ SOLN: 4.0000 mg | Freq: Once | INTRAMUSCULAR | Status: DC

## 2018-02-25 MED ORDER — SODIUM CHLORIDE 0.9 % IV BOLUS (SEPSIS)
1000.0000 mL | Freq: Once | INTRAVENOUS | Status: AC
  Administered 2018-02-25: 1000 mL via INTRAVENOUS

## 2018-02-25 MED ORDER — LIDOCAINE VISCOUS HCL 2 % MT SOLN
15.0000 mL | Freq: Once | OROMUCOSAL | Status: AC
  Administered 2018-02-25: 15 mL via ORAL
  Filled 2018-02-25: qty 15

## 2018-02-25 MED ORDER — PROMETHAZINE HCL 25 MG PO TABS: 25.0000 mg | ORAL_TABLET | Freq: Four times a day (QID) | ORAL | 0 refills | Status: DC | PRN

## 2018-02-25 MED ORDER — ALUM & MAG HYDROXIDE-SIMETH 200-200-20 MG/5ML PO SUSP
30.0000 mL | Freq: Once | ORAL | Status: AC
  Administered 2018-02-25: 30 mL via ORAL
  Filled 2018-02-25: qty 30

## 2018-02-25 MED ORDER — CEPHALEXIN 500 MG PO CAPS: 500.0000 mg | ORAL_CAPSULE | Freq: Three times a day (TID) | ORAL | 0 refills | Status: DC

## 2018-02-25 MED ORDER — PROBIOTIC PO CAPS: 1.0000 | ORAL_CAPSULE | Freq: Every day | ORAL | 1 refills | Status: AC

## 2018-02-25 MED ORDER — PROMETHAZINE HCL 25 MG/ML IJ SOLN
12.5000 mg | Freq: Once | INTRAMUSCULAR | Status: AC
  Administered 2018-02-25: 12.5 mg via INTRAVENOUS
  Filled 2018-02-25: qty 1

## 2018-02-25 MED ORDER — ALUM & MAG HYDROXIDE-SIMETH 200-200-20 MG/5ML PO SUSP: 30.0000 mL | Freq: Once | ORAL | Status: DC

## 2018-02-25 NOTE — ED Notes (Addendum)
Patient stating continuing nausea but is holding down PO ginger ale.

## 2018-02-25 NOTE — ED Triage Notes (Signed)
Patient presents from home via EMS with complaints of hypotension and diarrhea. Patient with complicated abdominal history. Patient slipped and fell in her diarrhea and hit the back of her head. No LOC, no neck or back pain. Patient complaining of posterior head pain. BP on arrival 90/50s.

## 2018-02-25 NOTE — ED Notes (Signed)
Pt given ginger ale for her to sip on

## 2018-02-25 NOTE — ED Provider Notes (Addendum)
TIME SEEN: 1:26 AM  CHIEF COMPLAINT: Diarrhea, head injury  HPI: Patient is a 63 year old female with history of hypertension, diabetes, hyperlipidemia, bipolar disorder, IBS, gastroparesis, fibromyalgia who presents the emergency department with nausea and diarrhea that started today.  States she had a large episode of diarrhea and was unable to make it to the bathroom.  States on the way to the bathroom she slipped and fell and diarrhea and struck her head.  No loss of consciousness.  Not on blood thinners.  No numbness, tingling or focal weakness.  No neck or back pain.  No chest pain or shortness of breath or lightheadedness that led to her fall.  Was found to be hypotensive initially with EMS but this has improved with IV hydration.  She has had a cholecystectomy, hysterectomy.  No other abdominal surgery.  Denies dysuria, hematuria, vaginal bleeding or discharge.  Has had nausea but no vomiting.  She denies having any abdominal pain.  She denies recent travel, sick contacts.  ROS: See HPI Constitutional: no fever  Eyes: no drainage  ENT: no runny nose   Cardiovascular:  no chest pain  Resp: no SOB  GI: no vomiting; + diarrhea GU: no dysuria Integumentary: no rash  Allergy: no hives  Musculoskeletal: no leg swelling  Neurological: no slurred speech ROS otherwise negative  PAST MEDICAL HISTORY/PAST SURGICAL HISTORY:  Past Medical History:  Diagnosis Date  . Anxiety   . Bipolar 1 disorder (HCC)   . Breast density 06/25/2012   Right breast density, will get mammogram and US  . Depression   . Diabetes mellitus without complication (HCC)   . Duodenitis   . HTN (hypertension)   . Hyperlipidemia   . IBS (irritable bowel syndrome)     MEDICATIONS:  Prior to Admission medications   Medication Sig Start Date End Date Taking? Authorizing Provider  ALPRAZolam (XANAX) 0.5 MG tablet TK 1 T PO TID PRN FOR 30 DAYS 09/10/17   [provider]  AMBULATORY NON FORMULARY MEDICATION  [place] x 1 03/07/15   Nandigam, Eleonore ChiquitoKavitha V, MD  amLODipine (NORVASC) 5 MG tablet Take 5 mg by mouth daily.    [provider]  BD PEN NEEDLE NANO U/F 32G X 4 MM MISC USE WITH LANTUS AND NOVOLOG PENS 12/17/16   Carlus PavlovGherghe, Cristina, MD  BENICAR 40 MG tablet  02/02/15   [provider]  busPIRone (BUSPAR) 10 MG tablet Take 1 tablet (10 mg total) by mouth 2 (two) times daily. 12/20/17   Archer AsaPlovsky, Gerald, MD  desipramine (NORPRAMIN) 25 MG tablet 3  qam 11/12/17   Archer AsaPlovsky, Gerald, MD  FREESTYLE LITE test strip USE TO CHECK BLOOD SUGAR 6 TIMES PER DAY 07/15/17   Carlus PavlovGherghe, Cristina, MD  gabapentin (NEURONTIN) 800 MG tablet Take 800 mg by mouth. 1 tablet in the morning and 2 tablets at bedtime. 02/05/15   [provider]  glucagon (GLUCAGON EMERGENCY) 1 MG injection Inject 1 mg into the muscle once as needed. 08/07/16   Carlus PavlovGherghe, Cristina, MD  hydrOXYzine (ATARAX/VISTARIL) 25 MG tablet 2  bid 11/12/17   Plovsky, Earvin HansenGerald, MD  insulin aspart (NOVOLOG FLEXPEN) 100 UNIT/ML FlexPen INJECT UP TO 40 UNITS DAILY, ALSO INCLUDING SLIDING SCALE 05/07/17   Carlus PavlovGherghe, Cristina, MD  insulin degludec (TRESIBA FLEXTOUCH) 100 UNIT/ML SOPN FlexTouch Pen Inject 30 units into the skin daily. Patient not taking: Reported on 11/12/2017 08/07/17   Romero BellingEllison, Sean, MD  Insulin Syringes, Disposable, U-100 1 ML MISC To use for insulin injection.. 09/23/15  Carlus Pavlov, MD  lamoTRIgine (LAMICTAL) 25 MG tablet 1  qhs Patient not taking: Reported on 11/12/2017 09/16/17   Archer Asa, MD  Lancets (FREESTYLE) lancets Use to test blood sugar 4 to 6 times daily as instructed. 10/17/17   Carlus Pavlov, MD  LANTUS SOLOSTAR 100 UNIT/ML Solostar Pen INJECT 30 UNITS UNDER THE SKIN DAILY (DISCONTINUE TRESIBA AND TOUJEO) 09/13/17   Carlus Pavlov, MD  metFORMIN (GLUCOPHAGE) 500 MG tablet Take 1 tablet (500 mg total) by mouth 2 (two) times daily with a meal. 12/13/17   Carlus Pavlov, MD  omeprazole (PRILOSEC) 40 MG  capsule Take 1 capsule (40 mg total) by mouth 2 (two) times daily. 01/13/18   Unk Lightning, PA  ondansetron (ZOFRAN-ODT) 4 MG disintegrating tablet PLACE 1 TO 2 TABLETS ON TONGUE EVERY 4 TO 6 HOURS AS NEEDED FOR NAUSEA 09/27/17   Unk Lightning, PA  OSCIMIN 0.125 MG SUBL DISSOLVE 1 TABLET UNDER THE TONGUE EVERY 4 HOURS AS NEEDED 02/14/17   Unk Lightning, PA  traZODone (DESYREL) 100 MG tablet 1  qhs  May increse to 2  qhs  After 1 week 11/12/17   Archer Asa, MD    ALLERGIES:  Allergies  Allergen Reactions  . Abilify [Aripiprazole]     Patient is intolerant  . Reglan [Metoclopramide] Other (See Comments)    Chest pains    SOCIAL HISTORY:  Social History   Tobacco Use  . Smoking status: Never Smoker  . Smokeless tobacco: Never Used  Substance Use Topics  . Alcohol use: No    Alcohol/week: 0.0 standard drinks    FAMILY HISTORY: Family History  Problem Relation Age of Onset  . Hypertension Mother   . Bipolar disorder Mother   . Cancer Mother   . Heart disease Brother   . Thyroid disease Sister   . Hypertension Sister   . Bipolar disorder Sister   . Depression Father   . Cancer Father   . Bipolar disorder Daughter   . Bipolar disorder Maternal Aunt   . Colon cancer Neg Hx   . Colon polyps Neg Hx   . Esophageal cancer Neg Hx   . Kidney disease Neg Hx   . Gallbladder disease Neg Hx   . Diabetes Neg Hx   . Dementia Neg Hx     EXAM: BP 130/62   Pulse 92   Temp 97.7 F (36.5 C) (Oral)   Resp 18   SpO2 100%  CONSTITUTIONAL: Alert and oriented and responds appropriately to questions. Well-appearing; well-nourished; GCS 15 HEAD: Normocephalic; atraumatic EYES: Conjunctivae clear, PERRL, EOMI ENT: normal nose; no rhinorrhea; moist mucous membranes; pharynx without lesions noted; no dental injury; no septal hematoma NECK: Supple, no meningismus, no LAD; no midline spinal tenderness, step-off or deformity; trachea midline CARD: RRR; S1 and S2  appreciated; no murmurs, no clicks, no rubs, no gallops RESP: Normal chest excursion without splinting or tachypnea; breath sounds clear and equal bilaterally; no wheezes, no rhonchi, no rales; no hypoxia or respiratory distress CHEST:  chest wall stable, no crepitus or ecchymosis or deformity, nontender to palpation; no flail chest ABD/GI: Normal bowel sounds; non-distended; soft, non-tender, no rebound, no guarding; no ecchymosis or other lesions noted PELVIS:  stable, nontender to palpation BACK:  The back appears normal and is non-tender to palpation, there is no CVA tenderness; no midline spinal tenderness, step-off or deformity EXT: Normal ROM in all joints; non-tender to palpation; no edema; normal capillary refill; no cyanosis, no bony tenderness or  bony deformity of patient's extremities, no joint effusion, compartments are soft, extremities are warm and well-perfused, no ecchymosis SKIN: Normal color for age and race; warm NEURO: Moves all extremities equally PSYCH: The patient's mood and manner are appropriate. Grooming and personal hygiene are appropriate.  MEDICAL DECISION MAKING: Patient here with nausea, diarrhea.  Abdominal exam benign.  Suspect viral illness.  She did slip and hit her head tonight.  Given her age, will obtain CT of the head.  No other sign of trauma on exam.  Will obtain labs, urine.  Will treat symptomatically with IV fluids, Zofran, Imodium.  Doubt appendicitis, colitis, diverticulitis, bowel obstruction based on benign exam.  ED PROGRESS: Patient's urine appears infected.  We will send urine culture and give Rocephin.  No previous urine cultures in our system.  Patient able to drink without difficulty.  No further diarrhea in the emergency department.  Labs unremarkable.  Head CT negative.  EKG shows no ischemia, arrhythmia or interval abnormality.  Anticipate discharge home with prescriptions of Phenergan, Keflex.  Recommended over-the-counter Imodium as needed for  diarrhea.  Recommended probiotic while on antibiotics.  Patient now states that she has had some dysuria and urinary frequency when told that she has a urinary tract infection.   At this time, I do not feel there is any life-threatening condition present. I have reviewed and discussed all results (EKG, imaging, lab, urine as appropriate) and exam findings with patient/family. I have reviewed nursing notes and appropriate previous records.  I feel the patient is safe to be discharged home without further emergent workup and can continue workup as an outpatient as needed. Discussed usual and customary return precautions. Patient/family verbalize understanding and are comfortable with this plan.  Outpatient follow-up has been provided as needed. All questions have been answered.   6:25 AM  Patient having some epigastric discomfort.  She is status post cholecystectomy.  No tenderness at McBurney's point.  Still having some intermittent nausea.  No vomiting here.  Has Zofran at home and received Zofran by EMS and 2 rounds of Phenergan here.  Allergic to Reglan.  Will provide with GI cocktail.  Recommended close follow-up with her primary care doctor and discussed return precautions.    EKG Interpretation  Date/Time:  Tuesday February 25 2018 01:27:28 EST Ventricular Rate:  95 PR Interval:    QRS Duration: 102 QT Interval:  370 QTC Calculation: 466 R Axis:   72 Text Interpretation:  Normal sinus rhythm Artifact No significant change since last tracing Confirmed by Davanee Klinkner, Baxter Hire [phone]) on 02/25/2018 1:33:07 AM         Reva Pinkley, Layla Maw, DO 02/25/18 0421    Cherl Gorney, Layla Maw, DO 02/25/18 [phone]

## 2018-02-25 NOTE — Discharge Instructions (Signed)
You may use over-the-counter Imodium as needed for diarrhea.  You may alternate between Zofran and Phenergan at home as needed for nausea and vomiting.  You may take Tylenol 1000 mg every 6 hours as needed for pain and fever.

## 2018-02-25 NOTE — ED Notes (Signed)
Bed: WA15 Expected date:  Expected time:  Means of arrival:  Comments: EMS  

## 2018-02-26 ENCOUNTER — Telehealth: Payer: Self-pay

## 2018-02-26 NOTE — Telephone Encounter (Signed)
FYI, I took this phone call from the front earlier and asked Dr. Charlean Sanfilippo advise on the matter.

## 2018-02-26 NOTE — Telephone Encounter (Signed)
Spoke to patient, she states her sugars are running about the same.  Patient will call us back tomorrow afternoon with more detailed information such as outlined by Dr. Elvera Lennox.

## 2018-02-26 NOTE — Telephone Encounter (Signed)
I am sorry to hear that she had to go to the ED.  Reviewing the note from the ED, she had a UTI.  This may be the reason for her feeling poorly.  We just decreased her insulin doses 2 days ago.  Did the sugars improve?   Whenever she calls, I need to know exactly the doses of insulin that she is taking at that time along with the sugars to be able to make further corrections.

## 2018-02-26 NOTE — Telephone Encounter (Signed)
Patient calling to tell us she fell in the bathroom and was taken to the ER.  Patient states" there has to be some way to balance out my blood sugars when my body just doesn't want to eat.

## 2018-02-26 NOTE — Telephone Encounter (Signed)
Pt called and stated that her blood sugars have been low today and she is beginning to get worried.  Her blood sugar this morning was 140, but has consisently gone down since then. At lunch her BS was 55, and is currently 50, and she has taken 2 glucose tablets at this time. Please advise.

## 2018-02-26 NOTE — Telephone Encounter (Signed)
Is this from before we decreased her insulin doses?

## 2018-02-27 LAB — URINE CULTURE: Culture: >=100000 — AB

## 2018-02-27 NOTE — Telephone Encounter (Signed)
This appears to be a duplicate telephone note, because the pt called twice and spoke with two different clinical staff. I will close this note, and all documentation can be contained into the one note that is still open with M.Conger,CMA.

## 2018-02-28 ENCOUNTER — Encounter: Payer: Self-pay | Admitting: Internal Medicine

## 2018-02-28 ENCOUNTER — Telehealth: Payer: Self-pay | Admitting: *Deleted

## 2018-02-28 ENCOUNTER — Telehealth: Payer: Self-pay

## 2018-02-28 NOTE — Telephone Encounter (Signed)
Patient requests to be called at ph# [phone] re: insulin dosage and what patient can eat.

## 2018-02-28 NOTE — Telephone Encounter (Signed)
Post ED Visit - Positive Culture Follow-up  Culture report reviewed by antimicrobial stewardship pharmacist: [facility] []  Enzo Bi, Pharm.D. []  Celedonio Miyamoto, [address].D., BCPS AQ-ID []  Garvin Fila, Pharm.D., BCPS []  Georgina Pillion, Pharm.D., BCPS [x]  Tiro, [address].D., BCPS, AAHIVP []  Estella Husk, Pharm.D., BCPS, AAHIVP []  Lysle Pearl, PharmD, BCPS []  Phillips Climes, PharmD, BCPS []  Agapito Games, PharmD, BCPS []  Verlan Friends, PharmD []  Mervyn Gay, PharmD, BCPS []  Vinnie Level, PharmD  [facility] []  Len Childs, PharmD []  Greer Pickerel, PharmD []  Adalberto Cole, PharmD []  Perlie Gold, Rph []  Lonell Face) Jean Rosenthal, PharmD []  Earl Many, PharmD []  Junita Push, PharmD [x]  Dorna Leitz, PharmD []  Terrilee Files, PharmD []  Lynann Beaver, PharmD []  Keturah Barre, PharmD []  Loralee Pacas, PharmD []  Bernadene Person, PharmD   Positive urine culture Treated with Cephalexin, organism sensitive to the same and no further patient follow-up is required at this time.  [facility] 02/28/2018, 1:34 PM

## 2018-02-28 NOTE — Telephone Encounter (Signed)
She needs to adjust the insulin doses based on her meals. If not eating much >> only take half of the regular doses for any of the meals (5-3-5 units). If eating a full meal, take 10-06-08 units with B-L-D.

## 2018-02-28 NOTE — Telephone Encounter (Signed)
Took call from patient who states:  " I need to know what to do when her body won't let her eat much to keep her sugar ok"  Patient states she just can not eat much, she tells me about her recent trip to the ER and states she lost her papers with her blood sugar readings but did have some for this week:  Tues [phone] Wed 140 60 99 Thur 94 135 49  This morning 143.  Patient states she is using 10 units before breakfast and dinner and 6 at lunch.

## 2018-03-03 NOTE — Telephone Encounter (Signed)
Duplicate encounter.  See open phone note regarding this.

## 2018-03-03 NOTE — Telephone Encounter (Signed)
Notified patient of message from Dr. Gherghe, patient expressed understanding and agreement. No further questions.  

## 2018-03-04 ENCOUNTER — Other Ambulatory Visit: Payer: Self-pay

## 2018-03-04 ENCOUNTER — Ambulatory Visit (INDEPENDENT_AMBULATORY_CARE_PROVIDER_SITE_OTHER): Payer: Medicare Other | Admitting: Internal Medicine

## 2018-03-04 ENCOUNTER — Encounter: Payer: Self-pay | Admitting: Internal Medicine

## 2018-03-04 VITALS — BP 160/60 | HR 110 | Ht 64.5 in | Wt 170.0 lb

## 2018-03-04 DIAGNOSIS — E139 Other specified diabetes mellitus without complications: Secondary | ICD-10-CM | POA: Diagnosis not present

## 2018-03-04 DIAGNOSIS — Z23 Encounter for immunization: Secondary | ICD-10-CM

## 2018-03-04 DIAGNOSIS — G63 Polyneuropathy in diseases classified elsewhere: Secondary | ICD-10-CM | POA: Diagnosis not present

## 2018-03-04 NOTE — Patient Instructions (Addendum)
Please change: - Metformin 500 mg x2 at night - Lantus 32-34 units at bedtime - NovoLog:  9-11 units in am 5-7 units with lunch 9-11 units with dinner - Novolog Sliding scale: 150-200: + 1 unit 201-250: + 2 units 251-300: + 3 units 301-350: + 4 units >350: + 5 units  Please return in 3 months with your sugar log.

## 2018-03-04 NOTE — Progress Notes (Addendum)
Patient ID: [name], female   DOB: 03-Apr-1955, 63 y.o.   MRN: [id number]  HPI: [name] is a 63 y.o.-year-old female, initially referred by her PCP, Dr. Sharilyn Sites (PA: Collene Mares), returning for f/u for LADA, dx 2005, insulin-dependent since ~2006, controlled, with complications (PN, gastroparesis?). Last visit 3 months ago.  She is here with her husband who offers part of the history especially her recent UTI episode, blood sugars at home and patient's diet.  Her diabetes management is limited by her anxiety/panic attacks.  Sugars become very high when she is stressed.  She is seeing Dr. Casimiro Needle.  On desipramine and lamotrigine.  At last visit, she was feeling better after she started to pray more.  At that time, she also started to improve her diet and limiting portions.  She was cutting down sweets.  She felt that her sugars are better and less fluctuating after starting this diet.  She had a UTI recently >> felt poorly and could not eat >> lowest CBG was 31 >> fell in the bathroom.  At that time, she called [place] and I advised her to reduce insulin doses.  However, at this visit, she is back on the same doses as her sugars improved after her UTI was treated.  She just finished Keflex.  Reviewed history: She was on an Omnipod insulin pump on 08/01/2015. She had severe anxiety and depression and got so overwhelmed with the insulin pump that we had to take her off the pump.   We switched her to a basal-bolus insulin regimen, but she was unable to calculate the insulin doses based on insulin to carb ratio, so now she is using fixed rapid acting insulin doses.  Previous fructosamine levels: 12/19/2017: HbA1c calculated from fructosamine has improved to 7.1%! 09/12/2017: HbA1c  Calculated from the fructosamine is 7.5%, improved from before. 05/07/2017: HbA1c calculated from the fructosamine was 7.7% 02/07/2017: HbA1c calculated from the fructosamine was higher, at 7.7%. 11/07/2016: HbA1c  calc. from the fructosamine is a little lower, at 7.25% 08/07/2016: HbA1c calculated from the fructosamine is 7.5% 05/31/2016: HbA1c calculated from fructosamine is better, at 7.25% 12/15/2015: HbA1c calculated from the fructosamine is higher, 8.4%. 08/24/2015:  HbA1c calculated from fructosamine is 7.4%. 05/24/2015: HbA1c calculated from fructosamine is 6.9%. ... Lab Results  Component Value Date   HGBA1C 6.0 (A) 12/19/2017   HGBA1C 5.6 11/07/2016   HGBA1C 4.6 07/16/2013   She is on: - Metformin 500 mg x2 at night - Lantus 32-34 units at bedtime - NovoLog:  10 units in am 6 units with lunch 10 units in dinner  - Novolog Sliding scale: 150-200: + 1 unit 201-250: + 2 units 251-300: + 3 units 301-350: + 4 units >350: + 5 units   Pt checks her sugars 4 times a day-reviewed her detailed log: - am:   116-228, 255 >> 50, 78-254, 286 >> 41, 122-193, 204, 227 - 2h after b'fast: 98 >> 342 x1 >> n/c - before lunch: 42, 59-207, 220, 273 >> 59, 67, 86-192, 215, 264 - 2h after lunch: 32 x1 >> n/c >> 128 >> n/c >> 63, 121 >> n/c - before dinner:  50, 53, 78-229, 288 >> 63-191, 223, 263 - 2h after dinner: 211-365 >> 162 >> n/c - bedtime: 82-238, 250 >> 60-185, 202  >> 55, 63, 99-216, 286, 299 - at night: 116 >> 82, 250 >> 90 >> n/c Lowest sugar was 38 >> 43 >> 42 >> 41; she has hypoglycemia  awareness in the 50s. Highest sugar was 331 >> 316 (in last 2 months) >> 288 >> 299.  -No CKD, last BUN/creatinine:  Lab Results  Component Value Date   BUN 20 02/25/2018   CREATININE 0.76 02/25/2018   Normal ACR: Lab Results  Component Value Date   MICRALBCREAT 5.4 11/07/2016    -+ HL; lipids: Lab Results  Component Value Date   CHOL 152 12/19/2017   HDL 41.30 12/19/2017   LDLCALC 77 12/19/2017   TRIG 171.0 (H) 12/19/2017   CHOLHDL 4 12/19/2017  Not on a statin.  -Last eye exam was in 11/2017: No DR, + small cataract- [facility]: Dr. Earley Favor.  -She does have numbness and tingling  in her feet.  Also, she has occasional pain.  She is on Neurontin. She is seeing a podiatrist [facility]).  2 toenails removed due to fungal infection.  She also had a blister that healed.  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 She also has a dx of Parkinson ds.  And bipolar disease. Also: GERD, HTN, Fibromyalgia >> on Neurontin.  Latest TSH was reviewed and this was normal: Lab Results  Component Value Date   TSH 0.57 11/07/2016   ROS: Constitutional: no weight gain/no weight loss, + fatigue, + hot flashes, no subjective hypothermia Eyes: no blurry vision, no xerophthalmia ENT: no sore throat, no nodules palpated in neck, no dysphagia, no odynophagia, no hoarseness Cardiovascular: no CP/no SOB/no palpitations/no leg swelling Respiratory: + Cough/no SOB/no wheezing Gastrointestinal: no N/no V/+ D/no C/+ acid reflux Musculoskeletal: no muscle aches/no joint aches Skin: no rashes, + hair loss Neurological: no tremors/no numbness/no tingling/no dizziness, + headache  I reviewed pt's medications, allergies, PMH, social hx, family hx, and changes were documented in the history of present illness. Otherwise, unchanged from my initial visit note.  Since last visit, she changed imipramine to desipramine.  Past Medical History:  Diagnosis Date  . Anxiety   . Bipolar 1 disorder ([place])   . Breast density 06/25/2012   Right breast density, will get mammogram and [place]  . Depression   . Diabetes mellitus without complication ([place])   . Duodenitis   . HTN (hypertension)   . Hyperlipidemia   . IBS (irritable bowel syndrome)    Past Surgical History:  Procedure Laterality Date  . ABDOMINAL HYSTERECTOMY    . CHOLECYSTECTOMY    . COLONOSCOPY     Social History   Socioeconomic History  . Marital status: Married    Spouse name: phillip  . Number of children: 1  . Years of education: 68  . Highest education level: High school graduate  Occupational History  .  Occupation: Disabled  Social Needs  . Financial resource strain: Not hard at all  . Food insecurity:    Worry: Never true    Inability: Never true  . Transportation needs:    Medical: Yes    Non-medical: Yes  Tobacco Use  . Smoking status: Never Smoker  . Smokeless tobacco: Never Used  Substance and Sexual Activity  . Alcohol use: No    Alcohol/week: 0.0 standard drinks  . Drug use: No  . Sexual activity: Not Currently  Lifestyle  . Physical activity:    Days per week: 0 days    Minutes per session: 0 min  . Stress: Not on file  Relationships  . Social connections:    Talks on phone: Never    Gets together: Never    Attends religious  service: More than 4 times per year    Active member of club or organization: No    Attends meetings of clubs or organizations: Never    Relationship status: Married  . Intimate partner violence:    Fear of current or ex partner: No    Emotionally abused: No    Physically abused: No    Forced sexual activity: No  Other Topics Concern  . Not on file  Social History Narrative   Lives at home w/ her husband   Left-handed   Caffeine: 1-2 cups of coffee daily   Current Outpatient Medications on File Prior to Visit  Medication Sig Dispense Refill  . ALPRAZolam (XANAX) 0.5 MG tablet TK 1 T PO TID PRN FOR 30 DAYS  2  . AMBULATORY NON FORMULARY MEDICATION Squatty Potty x 1 1 each 0  . amLODipine (NORVASC) 5 MG tablet Take 5 mg by mouth daily.    . BD PEN NEEDLE NANO U/F 32G X 4 MM MISC USE WITH LANTUS AND NOVOLOG PENS 200 each 4  . BENICAR 40 MG tablet     . busPIRone (BUSPAR) 10 MG tablet Take 1 tablet (10 mg total) by mouth 2 (two) times daily. 180 tablet 0  . cephALEXin (KEFLEX) 500 MG capsule Take 1 capsule (500 mg total) by mouth 3 (three) times daily. 15 capsule 0  . desipramine (NORPRAMIN) 25 MG tablet 3  qam 90 tablet 3  . FREESTYLE LITE test strip USE TO CHECK BLOOD SUGAR 6 TIMES PER DAY 600 each 2  . gabapentin (NEURONTIN) 800 MG  tablet Take 800 mg by mouth. 1 tablet in the morning and 2 tablets at bedtime.    Marland Kitchen glucagon (GLUCAGON EMERGENCY) 1 MG injection Inject 1 mg into the muscle once as needed. 1 each 12  . hydrOXYzine (ATARAX/VISTARIL) 25 MG tablet 2  bid 360 tablet 1  . insulin aspart (NOVOLOG FLEXPEN) 100 UNIT/ML FlexPen INJECT UP TO 40 UNITS DAILY, ALSO INCLUDING SLIDING SCALE 45 mL 3  . insulin degludec (TRESIBA FLEXTOUCH) 100 UNIT/ML SOPN FlexTouch Pen Inject 30 units into the skin daily. (Patient not taking: Reported on 11/12/2017) 15 mL 2  . Insulin Syringes, Disposable, U-100 1 ML MISC To use for insulin injection.. 100 each 1  . lamoTRIgine (LAMICTAL) 25 MG tablet 1  qhs (Patient not taking: Reported on 11/12/2017) 14 tablet 0  . Lancets (FREESTYLE) lancets Use to test blood sugar 4 to 6 times daily as instructed. 300 each 1  . LANTUS SOLOSTAR 100 UNIT/ML Solostar Pen INJECT 30 UNITS UNDER THE SKIN DAILY (DISCONTINUE TRESIBA AND TOUJEO) 15 mL 7  . metFORMIN (GLUCOPHAGE) 500 MG tablet Take 1 tablet (500 mg total) by mouth 2 (two) times daily with a meal. 180 tablet 11  . omeprazole (PRILOSEC) 40 MG capsule Take 1 capsule (40 mg total) by mouth 2 (two) times daily. 180 capsule 3  . ondansetron (ZOFRAN-ODT) 4 MG disintegrating tablet PLACE 1 TO 2 TABLETS ON TONGUE EVERY 4 TO 6 HOURS AS NEEDED FOR NAUSEA 180 tablet 4  . OSCIMIN 0.125 MG SUBL DISSOLVE 1 TABLET UNDER THE TONGUE EVERY 4 HOURS AS NEEDED 120 each 3  . Probiotic CAPS Take 1 capsule by mouth daily. 30 capsule 1  . promethazine (PHENERGAN) 25 MG tablet Take 1 tablet (25 mg total) by mouth every 6 (six) hours as needed for nausea or vomiting. 15 tablet 0  . traZODone (DESYREL) 100 MG tablet 1  qhs  May increse to 2  qhs  After 1 week 360 tablet 1   No current facility-administered medications on file prior to visit.    Allergies  Allergen Reactions  . Abilify [Aripiprazole]     Patient is intolerant  . Reglan [Metoclopramide] Other (See Comments)     Chest pains   Family History  Problem Relation Age of Onset  . Hypertension Mother   . Bipolar disorder Mother   . Cancer Mother   . Heart disease Brother   . Thyroid disease Sister   . Hypertension Sister   . Bipolar disorder Sister   . Depression Father   . Cancer Father   . Bipolar disorder Daughter   . Bipolar disorder Maternal Aunt   . Colon cancer Neg Hx   . Colon polyps Neg Hx   . Esophageal cancer Neg Hx   . Kidney disease Neg Hx   . Gallbladder disease Neg Hx   . Diabetes Neg Hx   . Dementia Neg Hx     PE: BP (!) 160/60   Pulse (!) 110   Ht 5' 4.5" (1.638 m) Comment: measured without shoes  Wt 170 lb (77.1 kg)   SpO2 96%   BMI 28.73 kg/m    Wt Readings from Last 3 Encounters:  03/04/18 170 lb (77.1 kg)  12/19/17 171 lb 12.8 oz (77.9 kg)  09/12/17 169 lb 3.2 oz (76.7 kg)   Constitutional: overweight, in NAD Eyes: PERRLA, EOMI, no exophthalmos ENT: moist mucous membranes, no thyromegaly, no cervical lymphadenopathy Cardiovascular: Tachycardia, RR, No MRG Respiratory: CTA B Gastrointestinal: abdomen soft, NT, ND, BS+ Musculoskeletal: no deformities, strength intact in all 4 Skin: moist, warm, no rashes Neurological: no tremor with outstretched hands, DTR normal in all 4  ASSESSMENT: 1. LADA, insulin-dependent, uncontrolled, with complications - peripheral neuropathy - gastroparesis?  Component     Latest Ref Rng 07/16/2013  C-Peptide     0.80 - 3.90 ng/mL 1.29  Glucose     70 - 99 mg/dL 183 (H)  Glutamic Acid Decarb Ab     <=1.0 U/mL 11.3 (H)  Pancreatic Islet Cell Antibody     <5 JDF Units 5 (A)  + anti-pancreatic antibodies >> LADA rather than type 2 DM. She still has a positive C peptide >> still has insulin secretion. For this reason, we continued the Metformin.  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 HL  PLAN:  1.  Patient with complex LADA, difficult to control, complicated by severe anxiety and depression.  She is on a basal-bolus insulin regimen and also  metformin.  She has very fluctuating blood sugars which improved after she started to change her diet to the point of lows.  Since last visit, we reduced her insulin doses.  She comes today sooner than her scheduled appointment for persistent low blood sugars. -She does a great job checking her blood sugars 4 times a day and writing them down in her log.  Sugars fluctuate from 40s to upper 200s. -She usually injects her insulin at the start of the meal rather than 15 minutes before due to gastroparesis.  We will continue this practice. -We tried to start Tresiba in the past but this was not covered.  This would have helped with her blood sugar fluctuations. -Before this visit, sugars decreased during her UTI, but as of now, they are similar to before.  In the morning they appear a little better and slightly lower later in the day.  However, sugars do fluctuate from the 50s to 200s  before each meal and we discussed about starting to vary the dose of NovoLog depending on the size of her meals.  She has larger dinners during the weekend and her sugars after dinner are in the 200s.  We discussed about using a slightly higher dose of NovoLog before these meals during the weekend.  Otherwise, I do not feel she needs any changes in her medicines. -At this visit, I discussed with patient and her husband how to manage low blood sugars.  We went over the 15-15 rule.  They do have a glucagon kit at home and I advised him how to use it correctly and also when to use it. - I suggested to:  Patient Instructions  Please change: - Metformin 500 mg x2 at night - Lantus 32-34 units at bedtime - NovoLog:  9-11 units in am 5-7 units with lunch 9-11 units with dinner - Novolog Sliding scale: 150-200: + 1 unit 201-250: + 2 units 251-300: + 3 units 301-350: + 4 units >350: + 5 units  Please return in 3 months with your sugar log.   - today we will check a fructosamine level - continue checking sugars at different  times of the day - check 4x a day, rotating checks - advised for yearly eye exams >> she is UTD - Return to clinic in 3 mo with sugar log    2. PN  -Due to diabetes -She continues on Neurontin without side effects.   -She had more pain at last visit so I recommended that she started alpha-lipoic acid 600 mg twice a day.  3. HL - Reviewed latest lipid panel from last visit: LDL low, triglycerides slightly high, but this was not a fasting sample: Lab Results  Component Value Date   CHOL 152 12/19/2017   HDL 41.30 12/19/2017   LDLCALC 77 12/19/2017   TRIG 171.0 (H) 12/19/2017   CHOLHDL 4 12/19/2017  -She is not on a statin.  Office Visit on 03/04/2018  Component Date Value Ref Range Status  . Fructosamine 03/04/2018 308* 205 - 285 umol/L Final    Hba1c calculated from fructosamine is the best in a long time, at 6.84%!  Philemon Kingdom, MD PhD [facility]

## 2018-03-06 LAB — FRUCTOSAMINE: Fructosamine: 308 umol/L — ABNORMAL HIGH (ref 205–285)

## 2018-03-25 ENCOUNTER — Other Ambulatory Visit: Payer: Self-pay | Admitting: Internal Medicine

## 2018-03-26 ENCOUNTER — Telehealth: Payer: Self-pay | Admitting: Gastroenterology

## 2018-03-26 MED ORDER — PLECANATIDE 3 MG PO TABS: 3.0000 mg | ORAL_TABLET | Freq: Every day | ORAL | 3 refills | Status: DC

## 2018-03-26 NOTE — Telephone Encounter (Signed)
Trulance sent to pharmacy as requested

## 2018-03-26 NOTE — Telephone Encounter (Signed)
Pt needs rf for Trulance sent to [facility].

## 2018-03-27 ENCOUNTER — Telehealth: Payer: Self-pay | Admitting: Internal Medicine

## 2018-03-27 ENCOUNTER — Other Ambulatory Visit: Payer: Self-pay

## 2018-03-27 ENCOUNTER — Ambulatory Visit (HOSPITAL_COMMUNITY): Payer: Medicare Other | Admitting: Psychiatry

## 2018-03-27 NOTE — Telephone Encounter (Signed)
Patient called to inform that [facility] has faxed [place] for her RX for her needles.

## 2018-03-28 NOTE — Telephone Encounter (Signed)
If she is referring to insulin pen needles, those were sent 03/25/18.

## 2018-04-07 ENCOUNTER — Telehealth: Payer: Self-pay | Admitting: Internal Medicine

## 2018-04-07 MED ORDER — METFORMIN HCL 500 MG PO TABS: 500.0000 mg | ORAL_TABLET | Freq: Two times a day (BID) | ORAL | 11 refills | Status: DC

## 2018-04-07 NOTE — Telephone Encounter (Signed)
RX sent

## 2018-04-07 NOTE — Telephone Encounter (Signed)
MEDICATION: metFORMIN (GLUCOPHAGE) 500 MG tablet  PHARMACY:  [facility] - [place], [place] - [address]  IS THIS A 90 DAY SUPPLY : yes  IS PATIENT OUT OF MEDICATION:  no  IF NOT; HOW MUCH IS LEFT:  Half a bottle   LAST APPOINTMENT DATE: @3 /26/2020  NEXT APPOINTMENT DATE:@4 /20/2020  DO WE HAVE YOUR PERMISSION TO LEAVE A DETAILED MESSAGE:  OTHER COMMENTS:    **Let patient know to contact pharmacy at the end of the day to make sure medication is ready. **  ** Please notify patient to allow 48-72 hours to process**  **Encourage patient to contact the pharmacy for refills or they can request refills through [facility]**

## 2018-04-09 ENCOUNTER — Telehealth (HOSPITAL_COMMUNITY): Payer: Self-pay

## 2018-04-09 ENCOUNTER — Other Ambulatory Visit (HOSPITAL_COMMUNITY): Payer: Self-pay | Admitting: Psychiatry

## 2018-04-09 DIAGNOSIS — F419 Anxiety disorder, unspecified: Secondary | ICD-10-CM | POA: Diagnosis not present

## 2018-04-09 NOTE — Telephone Encounter (Signed)
Medication management - Telephone call with patient in need of a refill of Trazodone.  Patient no showed for appt 03/27/18 and has canceled several previous appts.  Scheduled phone visit for 04/10/18 at 1:30pm with Dr. Donell Beers.

## 2018-04-10 ENCOUNTER — Other Ambulatory Visit: Payer: Self-pay

## 2018-04-10 ENCOUNTER — Ambulatory Visit (INDEPENDENT_AMBULATORY_CARE_PROVIDER_SITE_OTHER): Payer: Medicare Other | Admitting: Psychiatry

## 2018-04-10 ENCOUNTER — Telehealth: Payer: Self-pay | Admitting: Gastroenterology

## 2018-04-10 DIAGNOSIS — F341 Dysthymic disorder: Secondary | ICD-10-CM

## 2018-04-10 MED ORDER — TRAZODONE HCL 100 MG PO TABS: ORAL_TABLET | ORAL | 1 refills | Status: AC

## 2018-04-10 MED ORDER — DESIPRAMINE HCL 25 MG PO TABS: ORAL_TABLET | ORAL | 3 refills | Status: DC

## 2018-04-10 NOTE — Progress Notes (Signed)
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  History of Present Illness  Today the patient is doing fairly well emotionally.  She denies persistent daily depression.  She is not been well physically and that is why she has not been seen here in over 6 months.  She is been in the hospital emergency room multiple times.  She is had multiple falls.  She has a doctor in [place] who is looking in on her.  The patient has some chronic illnesses including fibromyalgia and irritable bowel syndrome.  Nonetheless the patient seems to be functioning fairly well.  She denies daily persistent depression.  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  The patient is sleeping well.  Her appetite is fairly good.  She no longer takes Vistaril as she had problems with it.  She takes desipramine on a regular basis in the morning 75 mg.  Trazodone 100 mg 2 at night puts her to sleep without a problem.  She is not falling at night.  I do not think her falls are related to the trazodone.  Patient has no evidence of psychosis.  She drinks no alcohol uses no drugs.  Emotionally she is actually very stable.  She is a good relationship with her husband.  Her insomnia is much improved.  Patient denies chest pain or shortness of breath at this time.  She has no respiratory symptoms. Depression Symptoms:  depressed mood, (Hypo) Manic Symptoms:   Anxiety Symptoms:  Excessive Worry, Psychotic Symptoms:   PTSD Symptoms:   Past Psychiatric History: 2 psychiatric hospitalizations, under the care of Dr.Cunningham under the care of Dr. Suella Broad  Previous Psychotropic Medications: Yes   Substance Abuse History in the last 12 months:  Yes.    Consequences of Substance  Abuse: NA  Past Medical History:  Past Medical History:  Diagnosis Date  . Anxiety   . Bipolar 1 disorder (HCC)   . Breast density 06/25/2012   Right breast density, will get mammogram and [place]  . Depression   . Diabetes mellitus without complication (HCC)   . Duodenitis   . HTN (hypertension)   . Hyperlipidemia   . IBS (irritable bowel syndrome)     Past Surgical History:  Procedure Laterality Date  . ABDOMINAL HYSTERECTOMY    . CHOLECYSTECTOMY    . COLONOSCOPY      Family Psychiatric History:   Family History:  Family History  Problem Relation Age of Onset  . Hypertension Mother   . Bipolar disorder Mother   . Cancer Mother   . Heart disease Brother   . Thyroid disease Sister   . Hypertension Sister   . Bipolar disorder Sister   . Depression Father   . Cancer Father   . Bipolar disorder Daughter   . Bipolar disorder Maternal Aunt   . Colon cancer Neg Hx   . Colon polyps Neg Hx   . Esophageal cancer Neg Hx   . Kidney disease Neg Hx   . Gallbladder disease Neg Hx   . Diabetes Neg Hx   . Dementia Neg Hx     Social History:   Social History   Socioeconomic History  . Marital status: Married    Spouse name: phillip  . Number of  children: 1  . Years of education: 26  . Highest education level: High school graduate  Occupational History  . Occupation: Disabled  Social Needs  . Financial resource strain: Not hard at all  . Food insecurity:    Worry: Never true    Inability: Never true  . Transportation needs:    Medical: Yes    Non-medical: Yes  Tobacco Use  . Smoking status: Never Smoker  . Smokeless tobacco: Never Used  Substance and Sexual Activity  . Alcohol use: No    Alcohol/week: 0.0 standard drinks  . Drug use: No  . Sexual activity: Not Currently  Lifestyle  . Physical activity:    Days per week: 0 days    Minutes per session: 0 min  . Stress: Not on file  Relationships  . Social connections:    Talks on phone: Never    Gets  together: Never    Attends religious service: More than 4 times per year    Active member of club or organization: No    Attends meetings of clubs or organizations: Never    Relationship status: Married  Other Topics Concern  . Not on file  Social History Narrative   Lives at home w/ her husband   Left-handed   Caffeine: 1-2 cups of coffee daily    Additional Social History:   Allergies:   Allergies  Allergen Reactions  . Abilify [Aripiprazole]     Patient is intolerant  . Reglan [Metoclopramide] Other (See Comments)    Chest pains    Metabolic Disorder Labs: Lab Results  Component Value Date   HGBA1C 6.0 (A) 12/19/2017   MPG 85 07/16/2013   MPG 91 04/15/2012   No results found for: PROLACTIN Lab Results  Component Value Date   CHOL 152 12/19/2017   TRIG 171.0 (H) 12/19/2017   HDL 41.30 12/19/2017   CHOLHDL 4 12/19/2017   VLDL 34.2 12/19/2017   LDLCALC 77 12/19/2017   LDLCALC 113 (H) 11/07/2016     Current Medications: Current Outpatient Medications  Medication Sig Dispense Refill  . ALPRAZolam (XANAX) 0.5 MG tablet TK 1 T PO TID PRN FOR 30 DAYS  2  . AMBULATORY NON FORMULARY MEDICATION Squatty Potty x 1 1 each 0  . amLODipine (NORVASC) 5 MG tablet Take 5 mg by mouth daily.    . BD PEN NEEDLE NANO U/F 32G X 4 MM MISC USE WITH LANTUS AND NOVOLOG PENS 200 each 3  . BENICAR 40 MG tablet     . busPIRone (BUSPAR) 10 MG tablet Take 1 tablet (10 mg total) by mouth 2 (two) times daily. 180 tablet 0  . cephALEXin (KEFLEX) 500 MG capsule Take 1 capsule (500 mg total) by mouth 3 (three) times daily. 15 capsule 0  . desipramine (NORPRAMIN) 25 MG tablet 3  qam 90 tablet 3  . FREESTYLE LITE test strip USE TO CHECK BLOOD SUGAR 6 TIMES PER DAY 600 each 2  . gabapentin (NEURONTIN) 800 MG tablet Take 800 mg by mouth. 1 tablet in the morning and 2 tablets at bedtime.    Marland Kitchen glucagon (GLUCAGON EMERGENCY) 1 MG injection Inject 1 mg into the muscle once as needed. 1 each 12  .  hydrOXYzine (ATARAX/VISTARIL) 25 MG tablet 2  bid (Patient not taking: Reported on 03/04/2018) 360 tablet 1  . insulin aspart (NOVOLOG FLEXPEN) 100 UNIT/ML FlexPen INJECT UP TO 40 UNITS DAILY, ALSO INCLUDING SLIDING SCALE 45 mL 3  . insulin degludec (TRESIBA FLEXTOUCH) 100  UNIT/ML SOPN FlexTouch Pen Inject 30 units into the skin daily. (Patient not taking: Reported on 03/04/2018) 15 mL 2  . Insulin Syringes, Disposable, U-100 1 ML MISC To use for insulin injection.. 100 each 1  . Lancets (FREESTYLE) lancets Use to test blood sugar 4 to 6 times daily as instructed. 300 each 1  . LANTUS SOLOSTAR 100 UNIT/ML Solostar Pen INJECT 30 UNITS UNDER THE SKIN DAILY (DISCONTINUE TRESIBA AND TOUJEO) 15 mL 7  . metFORMIN (GLUCOPHAGE) 500 MG tablet Take 1 tablet (500 mg total) by mouth 2 (two) times daily with a meal. 180 tablet 11  . omeprazole (PRILOSEC) 40 MG capsule Take 1 capsule (40 mg total) by mouth 2 (two) times daily. 180 capsule 3  . ondansetron (ZOFRAN-ODT) 4 MG disintegrating tablet PLACE 1 TO 2 TABLETS ON TONGUE EVERY 4 TO 6 HOURS AS NEEDED FOR NAUSEA 180 tablet 4  . OSCIMIN 0.125 MG SUBL DISSOLVE 1 TABLET UNDER THE TONGUE EVERY 4 HOURS AS NEEDED 120 each 3  . Plecanatide (TRULANCE) 3 MG TABS Take 3 mg by mouth daily. 90 tablet 3  . Probiotic CAPS Take 1 capsule by mouth daily. 30 capsule 1  . promethazine (PHENERGAN) 25 MG tablet Take 1 tablet (25 mg total) by mouth every 6 (six) hours as needed for nausea or vomiting. 15 tablet 0  . traZODone (DESYREL) 100 MG tablet 1  qhs  May increse to 2  qhs  After 1 week 180 tablet 1   No current facility-administered medications for this visit.     Neurologic: Headache: No Seizure: No Paresthesias:No  Musculoskeletal: Strength & Muscle Tone: within normal limits Gait & Station: normal Patient leans: N/A  Psychiatric Specialty Exam: ROS  There were no vitals taken for this visit.There is no height or weight on file to calculate BMI.  General  Appearance: Casual  Eye Contact:  Good  Speech:  Clear and Coherent  Volume:  Normal  Mood:  Anxious  Affect:  Appropriate  Thought Process:  Goal Directed  Orientation:  NA  Thought Content:  WDL  Suicidal Thoughts:  No  Homicidal Thoughts:  No  Memory:  Negative  Judgement:  Good  Insight:  Fair  Psychomotor Activity:  NA and Normal  Concentration:    Recall:  Fair  Fund of Knowledge:Good  Language: Good  Akathisia:  No  Handed:  Right  AIMS (if indicated):    Assets:  Desire for Improvement  ADL's:  Intact  Cognition: WNL  Sleep:      Treatment Plan Summary:  This patient's first problem is dysthymic disorder.  She takes desipramine 75 mg every morning and does well.  Her energy level is good and she denies daily persistent depression.  Her second problem is that of insomnia.  She takes trazodone 100 mg 2 at night.  The combination of these 2 drugs are her well.  She has had some falls but they are related to other medical conditions.  Today the patient was given the warnings about getting up slowly at night.  The patient says she never really falls at night.  The patient stays active.  She will come to see me in 3 months with her husband. Gypsy BalsamGerald I Jizel Cheeks, MD 4/9/20203:18 PM

## 2018-04-10 NOTE — Telephone Encounter (Signed)
You are very welcome. I faxed form back to [employer]. I recd confirmation fax. I placed faxed PA form and confirmation on your desk.

## 2018-04-10 NOTE — Telephone Encounter (Signed)
Have you or anyone else seen a prior auth request for this over there  Thanks

## 2018-04-10 NOTE — Telephone Encounter (Signed)
Yes we have received this, Rich Brave is working on this.

## 2018-04-10 NOTE — Telephone Encounter (Signed)
Patient states she needs a new prior authorization for medication [facility].

## 2018-04-10 NOTE — Telephone Encounter (Signed)
Thank you Rovanda  

## 2018-04-17 ENCOUNTER — Telehealth: Payer: Self-pay | Admitting: Internal Medicine

## 2018-04-17 NOTE — Telephone Encounter (Signed)
Patient called and states that she has been having blood sugars in the 40's intermittently throughout the last 2-3 weeks and that she is not able to eat enough food to compensate for her medication intake.  Wants to know how to proceed

## 2018-04-18 NOTE — Telephone Encounter (Signed)
We reviewed the protocol for hypoglycemia correction at previous visits, but I see that this was again reviewed with the caller Nurse.  I think she should be fine for now.  I will definitely address this again at next visit.   However, I am not sure why she is experiencing nausea.Marland KitchenMarland Kitchen

## 2018-04-18 NOTE — Telephone Encounter (Signed)
Dr. Elvera Lennox I am starting to get concerned about her, she calls [place] frequently with the same complaint, she can't eat and wants to know how to manage her blood sugar and medication when she can't eat. We have seen her and spoken to her about this many times. I am unsure how to proceed at this point.  Please see after hours triage call I have placed on your desk.

## 2018-04-21 ENCOUNTER — Ambulatory Visit: Payer: Self-pay | Admitting: Internal Medicine

## 2018-05-29 ENCOUNTER — Other Ambulatory Visit (HOSPITAL_COMMUNITY): Payer: Self-pay

## 2018-05-29 MED ORDER — DESIPRAMINE HCL 25 MG PO TABS: ORAL_TABLET | ORAL | 0 refills | Status: DC

## 2018-06-05 ENCOUNTER — Telehealth (HOSPITAL_COMMUNITY): Payer: Self-pay

## 2018-06-05 NOTE — Telephone Encounter (Signed)
Patient would like to come off of her Desipramine and wants to know how to titrate down. I asked patient why and she states she does not think it is working and she would like to try and see how she feels without it. Please review and advise, thank you

## 2018-06-06 ENCOUNTER — Telehealth: Payer: Self-pay | Admitting: Internal Medicine

## 2018-06-06 NOTE — Telephone Encounter (Signed)
Patient stated that she is concerned with taking her metformin. She has seen where they are recalling certain metformin medications and she stated she did not want to take this anymore until someone from the office tells her that it is okay to take,  Patient requested a call back .

## 2018-06-09 DIAGNOSIS — Z23 Encounter for immunization: Secondary | ICD-10-CM | POA: Diagnosis not present

## 2018-06-09 DIAGNOSIS — E119 Type 2 diabetes mellitus without complications: Secondary | ICD-10-CM | POA: Diagnosis not present

## 2018-06-09 DIAGNOSIS — G8929 Other chronic pain: Secondary | ICD-10-CM | POA: Diagnosis not present

## 2018-06-09 DIAGNOSIS — I10 Essential (primary) hypertension: Secondary | ICD-10-CM | POA: Diagnosis not present

## 2018-06-09 DIAGNOSIS — E039 Hypothyroidism, unspecified: Secondary | ICD-10-CM | POA: Diagnosis not present

## 2018-06-09 DIAGNOSIS — E663 Overweight: Secondary | ICD-10-CM | POA: Diagnosis not present

## 2018-06-09 DIAGNOSIS — E559 Vitamin D deficiency, unspecified: Secondary | ICD-10-CM | POA: Diagnosis not present

## 2018-06-09 DIAGNOSIS — L209 Atopic dermatitis, unspecified: Secondary | ICD-10-CM | POA: Diagnosis not present

## 2018-06-09 DIAGNOSIS — F0390 Unspecified dementia without behavioral disturbance: Secondary | ICD-10-CM | POA: Diagnosis not present

## 2018-06-09 DIAGNOSIS — Z0001 Encounter for general adult medical examination with abnormal findings: Secondary | ICD-10-CM | POA: Diagnosis not present

## 2018-06-09 DIAGNOSIS — Z1389 Encounter for screening for other disorder: Secondary | ICD-10-CM | POA: Diagnosis not present

## 2018-06-09 DIAGNOSIS — Z6827 Body mass index (BMI) 27.0-27.9, adult: Secondary | ICD-10-CM | POA: Diagnosis not present

## 2018-06-09 NOTE — Telephone Encounter (Signed)
Please advise 

## 2018-06-09 NOTE — Telephone Encounter (Signed)
Ok to take it for now, there is no definitive restriction from the FDA.

## 2018-06-11 NOTE — Telephone Encounter (Signed)
Patient notified

## 2018-06-12 ENCOUNTER — Telehealth: Payer: Self-pay | Admitting: Gastroenterology

## 2018-06-12 NOTE — Telephone Encounter (Signed)
Patient called in wanting to speak with the nurse she stated that she has been having constipation followed by diarrhea that causes her to pass out on the way to the bathroom. She is wanting to speak with the nurse for advice

## 2018-06-12 NOTE — Telephone Encounter (Signed)
Patient reports she has had some spells of diarrhea alternating with constipation. She states on occasions the diarrhea is urgent diarrhea.She states she has to "get up and go to the bathroom fast." This has resulted in "passing out" before she gets to the bathroom. She states loss of consciousness. Advised the patient that it sounds more like the rapid movement going from sitting to standing and "hurrying" to the bathroom is the cause of the "passing out." The episodes do not occur during the effort to move her bowels or even after moving her bowels. I have instructed her to contact her PCP to discuss further. Continue her present bowel regimen. Encourage hydration.

## 2018-06-13 NOTE — Telephone Encounter (Signed)
Ok, agree follow up with PMD for possible orthostatic hypotension/vaso vagal syncope? Advise patient to maintain adequate hydration. Please send Rx for Lomotil 1 tablet BID as needed X 90 tabs and schedule office tele visit next available.

## 2018-06-13 NOTE — Telephone Encounter (Signed)
Left message for the patient to call back.

## 2018-06-15 ENCOUNTER — Other Ambulatory Visit: Payer: Self-pay | Admitting: Internal Medicine

## 2018-06-18 NOTE — Telephone Encounter (Signed)
Reduce desipramine  To   2  qday for 2 days   Then  1  qday ( PM) for 2 days then  dc

## 2018-07-10 ENCOUNTER — Other Ambulatory Visit: Payer: Self-pay | Admitting: Internal Medicine

## 2018-07-17 ENCOUNTER — Ambulatory Visit (HOSPITAL_COMMUNITY): Payer: Medicare Other | Admitting: Psychiatry

## 2018-07-21 ENCOUNTER — Telehealth: Payer: Self-pay | Admitting: Gastroenterology

## 2018-07-21 MED ORDER — HYOSCYAMINE SULFATE SL 0.125 MG SL SUBL: SUBLINGUAL_TABLET | SUBLINGUAL | 0 refills | Status: DC

## 2018-07-21 NOTE — Telephone Encounter (Signed)
Prescription sent to patients mail order pharmacy and patient notified.

## 2018-07-21 NOTE — Telephone Encounter (Signed)
Okay to refill.  Thanks.

## 2018-07-21 NOTE — Telephone Encounter (Signed)
Dr. Silverio Decamp, is this ok to refill? Looks like it was prescribed by Ellouise Newer, PA at her last appt with her.

## 2018-07-25 ENCOUNTER — Other Ambulatory Visit: Payer: Self-pay

## 2018-07-25 MED ORDER — FREESTYLE LITE TEST VI STRP: ORAL_STRIP | 2 refills | Status: DC

## 2018-07-28 ENCOUNTER — Other Ambulatory Visit: Payer: Self-pay

## 2018-07-28 MED ORDER — TRULANCE 3 MG PO TABS: 3.0000 mg | ORAL_TABLET | Freq: Every day | ORAL | 1 refills | Status: DC

## 2018-07-28 NOTE — Telephone Encounter (Signed)
Patient up to date on her office visits and trulance refilled as pharmacy requested.

## 2018-08-07 DIAGNOSIS — E119 Type 2 diabetes mellitus without complications: Secondary | ICD-10-CM | POA: Diagnosis not present

## 2018-08-07 DIAGNOSIS — E039 Hypothyroidism, unspecified: Secondary | ICD-10-CM | POA: Diagnosis not present

## 2018-08-07 DIAGNOSIS — I10 Essential (primary) hypertension: Secondary | ICD-10-CM | POA: Diagnosis not present

## 2018-08-07 DIAGNOSIS — Z6827 Body mass index (BMI) 27.0-27.9, adult: Secondary | ICD-10-CM | POA: Diagnosis not present

## 2018-08-07 DIAGNOSIS — R55 Syncope and collapse: Secondary | ICD-10-CM | POA: Diagnosis not present

## 2018-08-07 DIAGNOSIS — E663 Overweight: Secondary | ICD-10-CM | POA: Diagnosis not present

## 2018-08-11 ENCOUNTER — Telehealth: Payer: Self-pay | Admitting: Internal Medicine

## 2018-08-11 DIAGNOSIS — R55 Syncope and collapse: Secondary | ICD-10-CM | POA: Diagnosis not present

## 2018-08-11 NOTE — Telephone Encounter (Signed)
Please have pt scheduled in next available slot

## 2018-08-11 NOTE — Telephone Encounter (Signed)
Per [facility], "BS 250, has been running over 200 every morning for the past 9 days."

## 2018-08-11 NOTE — Telephone Encounter (Signed)
Would you like this pt to schedule appt or make any adjustments? Please advise

## 2018-08-11 NOTE — Telephone Encounter (Signed)
Patient has called in regards to the message she left with the after hours service below,  "Husband states that his wife has been nauseated for a fewweeks. States she is asleep right now. She is having low glucoselevels and wants to know if she can use glucose tablets to bring up her glucose levels instead of eating junk food. Current glucose is 79. Informed husband that she can eat 3 glucose tablets (4gmseach) to bring her BS up to nl. Also can drink 4 oz of cola tobring up BS. Informed him that if her BS is very low (< 60) she can eat 3 glucose tabs, recheck BS in 15 min. If still low, eat 3 more glucose tabs and recheck BS in 15 min. Can do this up to 3 times. If after the 3rd time, BS is still < 60, she needs to go to the ER. Discussed with husband role of exercise in reducing blood glucose and role of sickness in increasing blood sugars. He declined a triage for her. Just wanted to ask questions about diabetes. Verbalized understanding. Stated he had learned more about diabetes today than in the past 3 months."

## 2018-08-12 DIAGNOSIS — I10 Essential (primary) hypertension: Secondary | ICD-10-CM | POA: Diagnosis not present

## 2018-08-12 DIAGNOSIS — E119 Type 2 diabetes mellitus without complications: Secondary | ICD-10-CM | POA: Diagnosis not present

## 2018-08-12 DIAGNOSIS — R55 Syncope and collapse: Secondary | ICD-10-CM | POA: Diagnosis not present

## 2018-08-12 DIAGNOSIS — Z6827 Body mass index (BMI) 27.0-27.9, adult: Secondary | ICD-10-CM | POA: Diagnosis not present

## 2018-08-14 ENCOUNTER — Other Ambulatory Visit: Payer: Self-pay

## 2018-08-15 ENCOUNTER — Ambulatory Visit (INDEPENDENT_AMBULATORY_CARE_PROVIDER_SITE_OTHER): Payer: Medicare Other | Admitting: Internal Medicine

## 2018-08-15 ENCOUNTER — Encounter: Payer: Self-pay | Admitting: Internal Medicine

## 2018-08-15 VITALS — BP 132/72 | HR 87 | Temp 98.5°F | Ht 65.0 in | Wt 167.0 lb

## 2018-08-15 DIAGNOSIS — E1065 Type 1 diabetes mellitus with hyperglycemia: Secondary | ICD-10-CM

## 2018-08-15 DIAGNOSIS — E10649 Type 1 diabetes mellitus with hypoglycemia without coma: Secondary | ICD-10-CM

## 2018-08-15 DIAGNOSIS — E119 Type 2 diabetes mellitus without complications: Secondary | ICD-10-CM | POA: Diagnosis not present

## 2018-08-15 LAB — POCT GLYCOSYLATED HEMOGLOBIN (HGB A1C): Hemoglobin A1C: 6.6 % — AB (ref 4.0–5.6)

## 2018-08-15 MED ORDER — LANTUS SOLOSTAR 100 UNIT/ML ~~LOC~~ SOPN: 34.0000 [IU] | PEN_INJECTOR | Freq: Every day | SUBCUTANEOUS | 3 refills | Status: DC

## 2018-08-15 MED ORDER — NOVOLOG FLEXPEN 100 UNIT/ML ~~LOC~~ SOPN: PEN_INJECTOR | SUBCUTANEOUS | 3 refills | Status: DC

## 2018-08-15 NOTE — Progress Notes (Signed)
Name: [name]: 63 y.o., female   MRN/ DOB: [id number], 10/01/1955     PCP: [name], [name], MD   Reason for Endocrinology Evaluation: LADA   Initial Endocrine Consultative Visit: 2015    PATIENT IDENTIFIER: [name] is a 63 y.o. female with a past medical history of LADA and Depression/Anxiety . The patient has followed with Endocrinology clinic since 2015 for consultative assistance with management of her diabetes.  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 She was on an insulin pump (Omnipod) in 2017 due to anxiety this became overwhelming and switched to MDI regimen. Her hemoglobin A1c has ranged from 4.8% in 2012, peaking at 6.6% in 2020.   SUBJECTIVE:   During the last visit (03/04/2018): Metformin 500 mg 2 tabs daily and continued insulin regimen   Today (08/15/2018): [name] is here due to concerns about hypoglycemia . She checks her blood sugars 4 times daily. The patient has had hypoglycemic episodes since the last clinic visit, which typically occur 6 x / month- most often occuring during the day. The patient is  symptomatic with these episodes. Otherwise, the patient has not required any recent emergency interventions for hypoglycemia and has not had recent hospitalizations secondary to hyper or hypoglycemic episodes.   Eats 3 meals a day, snacks 2 times a day     ROS: As per HPI and as detailed below: Review of Systems  HENT: Negative for congestion and sore throat.   Respiratory: Negative for cough and shortness of breath.   Cardiovascular: Negative for chest pain and palpitations.  Gastrointestinal: Negative for constipation and nausea.      HOME DIABETES REGIMEN:  Metformin 500 mg 2 tabs daily  Lantus 32-36 units - recently she has been using 32 units  Novolog 9-11 units Breakfast          5-7 units with lunch                 9-11 units with dinner CF (BG-150/50) - not sure if she is using     GLUCOSE LOG:      HISTORY:  Past Medical History:  Past Medical History:  Diagnosis Date  . Anxiety   . Bipolar 1 disorder (HCC)   . Breast density 06/25/2012   Right breast density, will get mammogram and US  . Depression   . Diabetes mellitus without complication (HCC)   . Duodenitis   . HTN (hypertension)   . Hyperlipidemia   . IBS (irritable bowel syndrome)    Past Surgical History:  Past Surgical History:  Procedure Laterality Date  . ABDOMINAL HYSTERECTOMY    . CHOLECYSTECTOMY    . COLONOSCOPY      Social History:  reports that she has never smoked. She has never used smokeless tobacco. She reports that she does not drink alcohol or use drugs. Family History:  Family History  Problem Relation Age of Onset  . Hypertension Mother   . Bipolar disorder Mother   . Cancer Mother   . Heart disease Brother   . Thyroid disease Sister   . Hypertension Sister   . Bipolar disorder Sister   . Depression Father   . Cancer Father   . Bipolar disorder Daughter   . Bipolar disorder Maternal Aunt   . Colon cancer Neg Hx   . Colon polyps Neg Hx   . Esophageal cancer Neg Hx   .  Kidney disease Neg Hx   . Gallbladder disease Neg Hx   . Diabetes Neg Hx   . Dementia Neg Hx      HOME MEDICATIONS: Allergies as of 08/15/2018      Reactions   Abilify [aripiprazole]    Patient is intolerant   Reglan [metoclopramide] Other (See Comments)   Chest pains      Medication List       Accurate as of August 15, 2018 11:54 AM. If you have any questions, ask your nurse or doctor.        ALPRAZolam 0.5 MG tablet Commonly known as: XANAX TK 1 T PO TID PRN FOR 30 DAYS   AMBULATORY NON FORMULARY MEDICATION Squatty Potty x 1   amLODipine 5 MG tablet Commonly known as: NORVASC Take 5 mg by mouth daily.   BD Pen Needle Nano U/F 32G X 4 MM Misc Generic drug:  Insulin Pen Needle USE WITH LANTUS AND NOVOLOG PENS   Benicar 40 MG tablet Generic drug: olmesartan   busPIRone 10 MG tablet Commonly known as: BUSPAR Take 1 tablet (10 mg total) by mouth 2 (two) times daily.   cephALEXin 500 MG capsule Commonly known as: KEFLEX Take 1 capsule (500 mg total) by mouth 3 (three) times daily.   desipramine 25 MG tablet Commonly known as: Norpramin 3  qam   freestyle lancets Use to test blood sugar 4 to 6 times daily as instructed.   FREESTYLE LITE test strip Generic drug: glucose blood USE TO CHECK BLOOD SUGAR 6 TIMES PER DAY   gabapentin 800 MG tablet Commonly known as: NEURONTIN Take 800 mg by mouth. 1 tablet in the morning and 2 tablets at bedtime.   glucagon 1 MG injection Commonly known as: Glucagon Emergency Inject 1 mg into the muscle once as needed.   hydrOXYzine 25 MG tablet Commonly known as: ATARAX/VISTARIL 2  bid   Hyoscyamine Sulfate SL 0.125 MG Subl Commonly known as: Oscimin DISSOLVE 1 TABLET UNDER THE TONGUE EVERY 4 HOURS AS NEEDED   insulin degludec 100 UNIT/ML Sopn FlexTouch Pen Commonly known as: [place]-B[place]uresiba FlexTouch Inject 30 units into the skin daily.   Insulin Syringes (Disposable) U-100 1 ML Misc To use for insulin injection..   Lantus SoloStar 100 UNIT/ML Solostar Pen Generic drug: Insulin Glargine INJECT 30 UNITS UNDER THE SKIN DAILY (DISCONTINUE TRESIBA AND TOUJEO)   metFORMIN 500 MG tablet Commonly known as: GLUCOPHAGE Take 1 tablet (500 mg total) by mouth 2 (two) times daily with a meal.   NovoLOG FlexPen 100 UNIT/ML FlexPen Generic drug: insulin aspart INJECT UP TO 20 UNITS DAILY, ALSO INCLUDING SLIDING SCALE   omeprazole 40 MG capsule Commonly known as: PRILOSEC Take 1 capsule (40 mg total) by mouth 2 (two) times daily.   ondansetron 4 MG disintegrating tablet Commonly known as: ZOFRAN-ODT PLACE 1 TO 2 TABLETS ON TONGUE EVERY 4 TO 6 HOURS AS NEEDED FOR NAUSEA   Probiotic Caps Take 1 capsule  by mouth daily.   promethazine 25 MG tablet Commonly known as: PHENERGAN Take 1 tablet (25 mg total) by mouth every 6 (six) hours as needed for nausea or vomiting.   traZODone 100 MG tablet Commonly known as: DESYREL 1  qhs  May increse to 2  qhs  After 1 week   Trulance 3 MG Tabs Generic drug: Plecanatide Take 3 mg by mouth daily.        OBJECTIVE:   Vital Signs: BP 132/72 (BP Location: Left Arm, Patient Position: Sitting,  Cuff Size: Normal)   Pulse 87   Temp 98.5 F (36.9 C)   Ht 5\' 5"  (1.651 m)   Wt 167 lb (75.8 kg)   SpO2 97%   BMI 27.79 kg/m   Wt Readings from Last 3 Encounters:  08/15/18 167 lb (75.8 kg)  03/04/18 170 lb (77.1 kg)  12/19/17 171 lb 12.8 oz (77.9 kg)     Exam: General: Pt appears well and is in NAD  Lungs: Clear with good BS bilat with no rales, rhonchi, or wheezes  Heart: RRR with normal S1 and S2 and no gallops; no murmurs; no rub  Abdomen: Normoactive bowel sounds, soft, nontender, without masses or organomegaly palpable  Extremities: No pretibial edema.  Skin: Normal texture and temperature to palpation. No rash noted. No Acanthosis nigricans/skin tags. No lipohypertrophy.  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 Goal A1c < 7.0 %.    -  Despite her seemingly optimal A1c at 6.6% this is due to recurrent hypoglyemia.  - She continues with insulin-CHO mismatch, we discussed the importance of trying to be consistent as much as possible with CHO intake per meal. I will refer her to our CDE ( she was unable to learn carb count in the past and we are NOT trying to teach her this now, but she needs to at least qualitatively assess her CHO intake per meal  to prevent hypoglycemia/hyperglycemia.  - Discussed pharmacokinetics of basal/bolus insulin and the importance of taking prandial insulin with meals.  We also discussed avoiding sugar-sweetened beverages and snacks, when possible.  - She admits to eating snacks at night and some times its crackers, this is partly why she continues to have high BG's in the morning, we discussed low CHO options for snacks.  - If she would like to continue with this practice, it may beneficial to have her take a small dose of Novolog with her late night snack.  - She also tends to over-correct- reviewed rule of 15 with her - I am not clear on what exactly she is doing with novolog, she said she only uses the scale but no the baseline dose ? No sure .  - To simplify her regimen,and to eliminate confusion , I will change the correctional scale as below ( as it doesn;t seem she has been using it consistently )  MEDICATIONS: - Increase Lantus to 34 units daily  - Novolog 10 units with Breakfast - Novolog 8 units with Lunch  - Novolog 10 units with Supper  - If pre-meal glucose >  200 mg/dL , ADD 2 more units of Novolog to that meal  - Continue Metformin  2 tablets daily   EDUCATION / INSTRUCTIONS:  BG monitoring instructions: Patient is instructed to check her blood sugars 4 times a day, before meals and bedtime .  Call [facility] if: BG persistently < 70 or > 300. . I reviewed the Rule of 15 for the treatment of hypoglycemia in detail with the patient. Literature supplied.     Signed  electronically by: Abby Jaralla , MD  [facility]  [facility] [address] [place], [place] [address] Phone: [phone] [phone] FAX: [phone]   CC: [name], [name], MD [address] [place] [address] Phone: [phone]  Fax: [phone]  Return to Endocrinology clinic as below: Future Appointments  Date Time Provider Department Center  08/29/2018  3:15 PM Carlus PavlovGherghe, Cristina, MD [facility] None  10/17/2018 10:20 AM Laqueta LindenKoneswaran, Suresh A, MD [facility]

## 2018-08-15 NOTE — Patient Instructions (Addendum)
-   Increase Lantus to 34 units daily  - Novolog 10 units with Breakfast - Novolog 8 units with Lunch  - Novolog 10 units with Supper  - If your sugar is over 200 before a meal, ADD 2 more units of Novolog to that meal  - Continue Metformin 2 tablets daily    Choose healthy, lower carb lower calorie snacks: toss salad, cooked vegetables, cottage cheese, peanut butter, low fat cheese / string cheese, lower sodium deli meat, tuna salad or chicken salad     HOW TO TREAT LOW BLOOD SUGARS (Blood sugar LESS THAN 70 MG/DL)  Please follow the RULE OF 15 for the treatment of hypoglycemia treatment (when your (blood sugars are less than 70 mg/dL)    STEP 1: Take 15 grams of carbohydrates when your blood sugar is low, which includes:   3-4 GLUCOSE TABS  OR  3-4 OZ OF JUICE OR REGULAR SODA OR  ONE TUBE OF GLUCOSE GEL     STEP 2: RECHECK blood sugar in 15 MINUTES STEP 3: If your blood sugar is still low at the 15 minute recheck --> then, go back to STEP 1 and treat AGAIN with another 15 grams of carbohydrates.

## 2018-08-27 ENCOUNTER — Other Ambulatory Visit: Payer: Self-pay

## 2018-08-28 ENCOUNTER — Other Ambulatory Visit: Payer: Self-pay | Admitting: Gastroenterology

## 2018-08-29 ENCOUNTER — Other Ambulatory Visit: Payer: Self-pay

## 2018-08-29 ENCOUNTER — Ambulatory Visit (INDEPENDENT_AMBULATORY_CARE_PROVIDER_SITE_OTHER): Payer: Medicare Other | Admitting: Internal Medicine

## 2018-08-29 ENCOUNTER — Encounter: Payer: Self-pay | Admitting: Internal Medicine

## 2018-08-29 ENCOUNTER — Encounter

## 2018-08-29 VITALS — BP 152/62 | HR 94 | Temp 98.9°F | Ht 65.0 in | Wt 168.8 lb

## 2018-08-29 DIAGNOSIS — E785 Hyperlipidemia, unspecified: Secondary | ICD-10-CM

## 2018-08-29 DIAGNOSIS — G63 Polyneuropathy in diseases classified elsewhere: Secondary | ICD-10-CM | POA: Diagnosis not present

## 2018-08-29 DIAGNOSIS — E139 Other specified diabetes mellitus without complications: Secondary | ICD-10-CM

## 2018-08-29 NOTE — Progress Notes (Signed)
Patient ID: [name], female   DOB: 1955-06-10, 63 y.o.   MRN: [id number]  HPI: [name] is a 63 y.o.-year-old female, initially referred by her PCP, Dr. Assunta Found (PA: Lenise Herald), returning for f/u for LADA, dx 2005, insulin-dependent since ~2006, controlled, with complications (PN, gastroparesis?).   Last visit was 2 weeks ago with my colleague.  At that time, she had fluctuating blood sugars and I was out of the office.    Her diabetes management is limited by her anxiety/panic attacks.  Sugars become very high when she is stressed.  She is seeing Dr. Donell Beers.   Reviewed history: She was on an Omnipod insulin pump on 08/01/2015. She had severe anxiety and depression and got so overwhelmed with the insulin pump that we had to take her off the pump.   We switched her to a basal-bolus insulin regimen, but she was unable to calculate the insulin doses based on insulin to carb ratio, so now she is using fixed rapid acting insulin doses.  Previous fructosamine levels: 03/04/2018:  Hba1c calculated from fructosamine is the best in a long time, at 6.84%! 12/19/2017: HbA1c calculated from fructosamine has improved to 7.1%! 09/12/2017: HbA1c  Calculated from the fructosamine is 7.5%, improved from before. 05/07/2017: HbA1c calculated from the fructosamine was 7.7% 02/07/2017: HbA1c calculated from the fructosamine was higher, at 7.7%. 11/07/2016: HbA1c calc. from the fructosamine is a little lower, at 7.25% 08/07/2016: HbA1c calculated from the fructosamine is 7.5% 05/31/2016: HbA1c calculated from fructosamine is better, at 7.25% 12/15/2015: HbA1c calculated from the fructosamine is higher, 8.4%. 08/24/2015:  HbA1c calculated from fructosamine is 7.4%. 05/24/2015: HbA1c calculated from fructosamine is 6.9%. ... Lab Results  Component Value Date   HGBA1C 6.6 (A) 08/15/2018   HGBA1C 6.0 (A) 12/19/2017   HGBA1C 5.6 11/07/2016   At last visit, she was on the following regimen: -  Metformin 500 mg twice a day - Lantus 32-34 units at bedtime - NovoLog:  9-11 units in am 5-7 units with lunch 9-11 units with dinner - Novolog Sliding scale: 150-200: + 1 unit 201-250: + 2 units 251-300: + 3 units 301-350: + 4 units >350: + 5 units  She is on the following regimen changed by Dr. Lonzo Cloud 2 weeks ago: - Lantus 34 units daily  - Novolog 10-08-08 units with B-L-D - If pre-meal glucose >  200 mg/dL , ADD 2 more units of Novolog to that meal  - Metformin 2 tablets daily    Pt checks her sugars more than 4 times a day - forgot log: - am:   50, 78-254, 286 >> 41, 122-193, 204, 227 >> 117-159 - 2h after b'fast: 98 >> 342 x1 >> n/c >> 211 - before lunch: 42, 59-207, 220, 273 >> 59, 67, 86-192, 215, 264 >> 100-160 - 2h after lunch: 32 x1 >> n/c >> 128 >> n/c >> 63, 121 >> n/c - before dinner:  50, 53, 78-229, 288 >> 63-191, 223, 263 >> 150-170 - 2h after dinner: 211-365 >> 162 >> n/c - bedtime: 60-185, 202  >> 55, 63, 99-216, 286, 299 >> <200 - at night: 116 >> 82, 250 >> 90 >> n/c Lowest sugar was 38 >> 43 >> 42 >> 41 >> 100; she has hypoglycemia awareness in the 50s. Highest sugar was 331 >> 316 (in last 2 months) >> 288 >> 299 >> 210.  -No CKD, last BUN/creatinine:  Lab Results  Component Value Date   BUN 20 02/25/2018  CREATININE 0.76 02/25/2018   Normal ACR: Lab Results  Component Value Date   MICRALBCREAT 5.4 11/07/2016    -+ HL; lipids: Lab Results  Component Value Date   CHOL 152 12/19/2017   HDL 41.30 12/19/2017   LDLCALC 77 12/19/2017   TRIG 171.0 (H) 12/19/2017   CHOLHDL 4 12/19/2017  Not on a statin.  -Last eye exam was in 11/2017: No DR, + small cataract-my Eye Dr: Dr. Janne LabMark Cottle.  -+ Numbness and tingling in her feet.  Occasional pain.  She is on Neurontin. She is seeing a podiatrist [facility]).  2 toenails removed before last visit due to fungal infection.  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 She also has a  dx of Parkinson ds.  She also has bipolar disease. Also: GERD, HTN, Fibromyalgia >> on Neurontin.  Latest TSH was normal: Lab Results  Component Value Date   TSH 0.57 11/07/2016   ROS: Constitutional: no weight gain/no weight loss, + fatigue, + subjective hyperthermia, no subjective hypothermia Eyes: no blurry vision, no xerophthalmia ENT: no sore throat, no nodules palpated in neck, no dysphagia, no odynophagia, no hoarseness Cardiovascular: no CP/no SOB/no palpitations/no leg swelling Respiratory: no cough/no SOB/no wheezing Gastrointestinal: no N/no V/no D/no C/no acid reflux Musculoskeletal: no muscle aches/no joint aches Skin: no rashes, no hair loss Neurological: no tremors/no numbness/no tingling/no dizziness  I reviewed pt's medications, allergies, PMH, social hx, family hx, and changes were documented in the history of present illness. Otherwise, unchanged from my initial visit note.  Past Medical History:  Diagnosis Date  . Anxiety   . Bipolar 1 disorder (HCC)   . Breast density 06/25/2012   Right breast density, will get mammogram and US  . Depression   . Diabetes mellitus without complication (HCC)   . Duodenitis   . HTN (hypertension)   . Hyperlipidemia   . IBS (irritable bowel syndrome)    Past Surgical History:  Procedure Laterality Date  . ABDOMINAL HYSTERECTOMY    . CHOLECYSTECTOMY    . COLONOSCOPY     Social History   Socioeconomic History  . Marital status: Married    Spouse name: phillip  . Number of children: 1  . Years of education: 7112  . Highest education level: High school graduate  Occupational History  . Occupation: Disabled  Social Needs  . Financial resource strain: Not hard at all  . Food insecurity    Worry: Never true    Inability: Never true  . Transportation needs    Medical: Yes    Non-medical: Yes  Tobacco Use  . Smoking status: Never Smoker  . Smokeless tobacco: Never Used  Substance and Sexual Activity  . Alcohol use:  No    Alcohol/week: 0.0 standard drinks  . Drug use: No  . Sexual activity: Not Currently  Lifestyle  . Physical activity    Days per week: 0 days    Minutes per session: 0 min  . Stress: Not on file  Relationships  . Social Musicianconnections    Talks on phone: Never    Gets together: Never    Attends religious service: More than 4 times per year    Active member of club or organization: No    Attends meetings of clubs or organizations: Never    Relationship status: Married  . Intimate partner violence    Fear of current or ex partner: No    Emotionally abused: No    Physically abused: No  Forced sexual activity: No  Other Topics Concern  . Not on file  Social History Narrative   Lives at home w/ her husband   Left-handed   Caffeine: 1-2 cups of coffee daily   Current Outpatient Medications on File Prior to Visit  Medication Sig Dispense Refill  . ALPRAZolam (XANAX) 0.5 MG tablet TK 1 T PO TID PRN FOR 30 DAYS  2  . AMBULATORY NON FORMULARY MEDICATION Squatty Potty x 1 1 each 0  . amLODipine (NORVASC) 5 MG tablet Take 5 mg by mouth daily.    . BD PEN NEEDLE NANO U/F 32G X 4 MM MISC USE WITH LANTUS AND NOVOLOG PENS 200 each 3  . BENICAR 40 MG tablet     . busPIRone (BUSPAR) 10 MG tablet Take 1 tablet (10 mg total) by mouth 2 (two) times daily. (Patient not taking: Reported on 08/15/2018) 180 tablet 0  . cephALEXin (KEFLEX) 500 MG capsule Take 1 capsule (500 mg total) by mouth 3 (three) times daily. (Patient not taking: Reported on 08/15/2018) 15 capsule 0  . desipramine (NORPRAMIN) 25 MG tablet 3  qam 270 tablet 0  . gabapentin (NEURONTIN) 800 MG tablet Take 800 mg by mouth. 1 tablet in the morning and 2 tablets at bedtime.    Marland Kitchen glucagon (GLUCAGON EMERGENCY) 1 MG injection Inject 1 mg into the muscle once as needed. 1 each 12  . glucose blood (FREESTYLE LITE) test strip USE TO CHECK BLOOD SUGAR 6 TIMES PER DAY 600 each 2  . hydrOXYzine (ATARAX/VISTARIL) 25 MG tablet 2  bid (Patient  not taking: Reported on 03/04/2018) 360 tablet 1  . insulin aspart (NOVOLOG FLEXPEN) 100 UNIT/ML FlexPen Inject 10 Units into the skin daily after breakfast AND 8 Units daily before lunch AND 10 Units daily before supper. Max daily 40 units. 45 mL 3  . Insulin Glargine (LANTUS SOLOSTAR) 100 UNIT/ML Solostar Pen Inject 34 Units into the skin daily. 30 mL 3  . Insulin Syringes, Disposable, U-100 1 ML MISC To use for insulin injection.. 100 each 1  . Lancets (FREESTYLE) lancets Use to test blood sugar 4 to 6 times daily as instructed. 300 each 1  . metFORMIN (GLUCOPHAGE) 500 MG tablet Take 1 tablet (500 mg total) by mouth 2 (two) times daily with a meal. 180 tablet 11  . omeprazole (PRILOSEC) 40 MG capsule Take 1 capsule (40 mg total) by mouth 2 (two) times daily. 180 capsule 3  . ondansetron (ZOFRAN-ODT) 4 MG disintegrating tablet PLACE 1 TO 2 TABLETS ON TONGUE EVERY 4 TO 6 HOURS AS NEEDED FOR NAUSEA 180 tablet 4  . OSCIMIN 0.125 MG SUBL DISSOLVE 1 TABLET UNDER THE TONGUE EVERY 4 HOURS AS NEEDED (NEED APPOINTMENT) 360 tablet 5  . Plecanatide (TRULANCE) 3 MG TABS Take 3 mg by mouth daily. 90 tablet 1  . Probiotic CAPS Take 1 capsule by mouth daily. 30 capsule 1  . promethazine (PHENERGAN) 25 MG tablet Take 1 tablet (25 mg total) by mouth every 6 (six) hours as needed for nausea or vomiting. (Patient not taking: Reported on 08/15/2018) 15 tablet 0  . traZODone (DESYREL) 100 MG tablet 1  qhs  May increse to 2  qhs  After 1 week 180 tablet 1   No current facility-administered medications on file prior to visit.    Allergies  Allergen Reactions  . Abilify [Aripiprazole]     Patient is intolerant  . Reglan [Metoclopramide] Other (See Comments)    Chest pains  Family History  Problem Relation Age of Onset  . Hypertension Mother   . Bipolar disorder Mother   . Cancer Mother   . Heart disease Brother   . Thyroid disease Sister   . Hypertension Sister   . Bipolar disorder Sister   . Depression  Father   . Cancer Father   . Bipolar disorder Daughter   . Bipolar disorder Maternal Aunt   . Colon cancer Neg Hx   . Colon polyps Neg Hx   . Esophageal cancer Neg Hx   . Kidney disease Neg Hx   . Gallbladder disease Neg Hx   . Diabetes Neg Hx   . Dementia Neg Hx     PE: BP (!) 152/62   Pulse 94   Ht 5\' 5"  (1.651 m)   Wt 168 lb 12.8 oz (76.6 kg)   SpO2 97%   BMI 28.09 kg/m    Wt Readings from Last 3 Encounters:  08/29/18 168 lb 12.8 oz (76.6 kg)  08/15/18 167 lb (75.8 kg)  03/04/18 170 lb (77.1 kg)   Constitutional: overweight, in NAD Eyes: PERRLA, EOMI, no exophthalmos ENT: moist mucous membranes, no thyromegaly, no cervical lymphadenopathy Cardiovascular: Tachycardia, RR, No MRG Respiratory: CTA B Gastrointestinal: abdomen soft, NT, ND, BS+ Musculoskeletal: no deformities, strength intact in all 4 Skin: moist, warm, no rashes Neurological: no tremor with outstretched hands, DTR normal in all 4  ASSESSMENT: 1. LADA, insulin-dependent, uncontrolled, with complications - peripheral neuropathy - gastroparesis?  Component     Latest Ref Rng 07/16/2013  C-Peptide     0.80 - 3.90 ng/mL 1.29  Glucose     70 - 99 mg/dL 183 (H)  Glutamic Acid Decarb Ab     <=1.0 U/mL 11.3 (H)  Pancreatic Islet Cell Antibody     <5 JDF Units 5 (A)  + anti-pancreatic antibodies >> LADA rather than type 2 DM. She still has a positive C peptide >> still has insulin secretion. For this reason, we continued the Metformin.  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 HL  PLAN:  1.  Patient with difficult to control LADA, complicated by severe anxiety and depression.  She is on a basal-bolus insulin regimen and also metformin.  She has very fluctuating blood sugars which improved after he started to change her diet in the past.  She was on an insulin pump before but she had high anxiety on this and we had to switch to insulin injections again.  We tried to start [place] in the past but this was not covered for her.   She does a great job checking her sugars many times a day and writing them in her log.  She usually injects her insulin at the start of the meal. -At last visit, sugars are still fluctuating from 50s to 200s and we discussed about changing the dose of NovoLog based on the size of her meals.  However, she was seen by my colleague 2 weeks ago with fluctuating blood sugars.  At that time, patient was not adding sliding scale to her regimen.  My colleague changed her to a fixed insulin dose with meals which she needs to adjust up if the sugars are higher than target, but by a fixed amount of insulin -After adjustment of her insulin doses, patient feels that her sugars are more stable and did not have any more significant lows.  However, she does not bring a log today.  Her recall, sugars are better in the morning and she  does not drop her sugars as much after meals.  CBGs are still above target before meals, however, I would be reticent to adjust the dosage further now due to previous history of significant fluctuations in the blood sugars. -We will continue the same regimen and strongly advised him to bring her sugar log at next visit -We also discussed about her diet.  CBG this morning was 117 but after Kelog cereals with almond milk, they increase to 211.  She is interested to see which cereals she can eat.  I recommended Kamut puffs and I gave her an example of a cereal bowl that would be less likely to raise her blood sugars (see patient instructions). - I suggested to:  Patient Instructions  Please continue: - Metformin 500 mg x2 at night - Lantus 34 units at bedtime - NovoLog:  10 units in am 8 units with lunch 10 units with dinner - Novolog Sliding scale: >200: + 2 unit  Example for cereal bowl: - Kamut puff cereals - 1/2 cup - fruit: berries, 1 banana (or half) - chia seeds - 1 scoop - ground flax - 1 scoop - cinnamon - 1 teaspoon - vanilla - splash - almond milk  Please return in 3  months with your sugar log.   - advised to check sugars at different times of the day - 4x a day, rotating check times - advised for yearly eye exams >> she is UTD - return to clinic in 4 months    2. PN  -Due to diabetes -She continues on Neurontin without side effects -She had more pain at last visit so I recommended that she started alpha-lipoic acid 600 mg twice a day.   She did not start this.    3. HL - Reviewed latest lipid panel from 12/2017: LDL at goal, triglycerides slightly high Lab Results  Component Value Date   CHOL 152 12/19/2017   HDL 41.30 12/19/2017   LDLCALC 77 12/19/2017   TRIG 171.0 (H) 12/19/2017   CHOLHDL 4 12/19/2017  -She is not on a statin.  Carlus Pavlovristina Kielan Dreisbach, MD PhD [facility]

## 2018-08-29 NOTE — Patient Instructions (Addendum)
Please continue: - Metformin 500 mg x2 at night - Lantus 34 units at bedtime - NovoLog:  10 units in am 8 units with lunch 10 units with dinner - Novolog Sliding scale: >200: + 2 unit  Example for cereal bowl: - Kamut puff cereals - 1/2 cup - fruit: berries, 1 banana (or half) - chia seeds - 1 scoop - ground flax - 1 scoop - cinnamon - 1 teaspoon - vanilla - splash - almond milk  Please return in 4 months with your sugar log.

## 2018-09-04 ENCOUNTER — Encounter: Payer: Self-pay | Admitting: Internal Medicine

## 2018-09-04 NOTE — Progress Notes (Signed)
Patient dropped by her blood sugar log for the last 4 weeks. Sugar ranged between 50s to 280s.  We will scanned records.  Since last visit, sugars have been fluctuating, variable in the morning, but usually lower after breakfast and after dinner.  She is currently on the regimen below.  - Metformin 500 mg x2 at night - Lantus34 units at bedtime - NovoLog:  10 units in am 8 units with lunch 10 units with dinner + NovoLog sliding scale  I will advise her to take 9 units instead of 10 with breakfast and dinner.

## 2018-09-05 ENCOUNTER — Other Ambulatory Visit: Payer: Self-pay | Admitting: Internal Medicine

## 2018-09-05 ENCOUNTER — Telehealth: Payer: Self-pay | Admitting: Internal Medicine

## 2018-09-05 NOTE — Telephone Encounter (Signed)
Patient is requesting that we call her when we receive the fax from [place] in regards to a refill on her FreeStyle Lancets.   Please Advise, Thanks

## 2018-09-05 NOTE — Telephone Encounter (Signed)
It has been sent!

## 2018-09-10 NOTE — Progress Notes (Signed)
Notified patient of message from Dr. Gherghe, patient expressed understanding and agreement. No further questions.  

## 2018-09-10 NOTE — Progress Notes (Signed)
Left message for patient to return our call at [phone].  

## 2018-09-15 ENCOUNTER — Telehealth: Payer: Self-pay | Admitting: Internal Medicine

## 2018-09-15 NOTE — Telephone Encounter (Signed)
I spoke with patient and gave new instructions, per Dr. Arman Filter message.

## 2018-09-15 NOTE — Telephone Encounter (Signed)
Please advise her to change the doses as follows: - Metformin 500 mg x2 at night - [id number] >> 30 units at bedtime - NovoLog:  9 units in am 8 units with lunch 9 units with dinner + NovoLog sliding scale  If sugars remain low after decreasing Lantus, please let us know.

## 2018-09-15 NOTE — Telephone Encounter (Signed)
Forwarding to Dr. G

## 2018-09-15 NOTE — Telephone Encounter (Signed)
Patient called to advise the following blood sugar levels - would like a call back regarding her lows at [phone]  09/13 breakfast BS 101 09/13 lunch time BS 89 09/13 supper time BS 81 09/13 bed time BS 71 (ate a Glucose tablet)  09/14 breakfast BS 95 09/14 lunch time BS 124 09/14 2pm BS 47 (ate a Glucose tablet)

## 2018-10-03 ENCOUNTER — Ambulatory Visit: Payer: Medicare Other | Admitting: Dietician

## 2018-10-16 ENCOUNTER — Telehealth: Payer: Self-pay

## 2018-10-16 NOTE — Telephone Encounter (Signed)
Patient called in wanting a new schedule for insulin shots because nu,bers have been more high in the mornings 330 the highest.   Please advise

## 2018-10-17 ENCOUNTER — Ambulatory Visit (INDEPENDENT_AMBULATORY_CARE_PROVIDER_SITE_OTHER): Payer: Medicare Other | Admitting: Cardiovascular Disease

## 2018-10-17 ENCOUNTER — Other Ambulatory Visit: Payer: Self-pay

## 2018-10-17 ENCOUNTER — Encounter: Payer: Self-pay | Admitting: Cardiovascular Disease

## 2018-10-17 VITALS — BP 95/63 | HR 93 | Temp 97.1°F | Ht 64.0 in | Wt 168.0 lb

## 2018-10-17 DIAGNOSIS — Z794 Long term (current) use of insulin: Secondary | ICD-10-CM

## 2018-10-17 DIAGNOSIS — I10 Essential (primary) hypertension: Secondary | ICD-10-CM | POA: Diagnosis not present

## 2018-10-17 DIAGNOSIS — E119 Type 2 diabetes mellitus without complications: Secondary | ICD-10-CM | POA: Diagnosis not present

## 2018-10-17 DIAGNOSIS — R55 Syncope and collapse: Secondary | ICD-10-CM

## 2018-10-17 NOTE — Patient Instructions (Signed)
Medication Instructions:  STOP AMLODIPINE   Labwork: NONE  Testing/Procedures: Your physician has recommended that you wear an event monitor. Event monitors are medical devices that record the heart's electrical activity. Doctors most often [place] these monitors to diagnose arrhythmias. Arrhythmias are problems with the speed or rhythm of the heartbeat. The monitor is a small, portable device. You can wear one while you do your normal daily activities. This is usually used to diagnose what is causing palpitations/syncope (passing out).    Follow-Up: Your physician recommends that you schedule a follow-up appointment in: 3 MONTHS    Any Other Special Instructions Will Be Listed Below (If Applicable).     If you need a refill on your cardiac medications before your next appointment, please call your pharmacy.

## 2018-10-17 NOTE — Progress Notes (Signed)
CARDIOLOGY CONSULT NOTE  Patient ID: Sabrina ComptonBarbara L [name] MRN: [id number] DOB/AGE: 65/05/1955 65 y.o.  Admit date: (Not on file) Primary Physician: [name], [name], MD   Reason for Consultation: Syncope  HPI: [name] is a 63 y.o. female who is being seen today for the evaluation of syncope at the request of [name], [name], MD.   I reviewed labs dated 08/07/2018: White blood cell 7.2, hemoglobin 11.4, platelets 206, normal troponin, BUN 18, creatinine 0.67, sodium 134, potassium 4.2, random cortisol 8.2, TSH 1.74.  I personally reviewed the ECG performed on 02/25/2018 which demonstrated sinus rhythm with no ischemic ST segment or T wave abnormalities.  She has passed out 5 times in the last 6 months with most recent episode occurring 3 weeks ago.  She denies any antecedent symptoms such as dizziness, palpitations, chest pain, or shortness of breath.  She has walked from the kitchen to the living room and passed out.  She once passed out in her front yard while emptying groceries from her car.  She has issues with neuropathy and has insulin-dependent diabetes mellitus.    Allergies  Allergen Reactions  . Abilify [Aripiprazole]     Patient is intolerant  . Reglan [Metoclopramide] Other (See Comments)    Chest pains    Current Outpatient Medications  Medication Sig Dispense Refill  . ALPRAZolam (XANAX) 1 MG tablet TK 1 T PO QID PRN    . AMBULATORY NON FORMULARY MEDICATION Squatty Potty x 1 1 each 0  . amLODipine (NORVASC) 5 MG tablet Take 5 mg by mouth daily.    . BD PEN NEEDLE NANO U/F 32G X 4 MM MISC USE WITH LANTUS AND NOVOLOG PENS 200 each 3  . BENICAR 40 MG tablet     . desipramine (NORPRAMIN) 25 MG tablet 3  qam 270 tablet 0  . gabapentin (NEURONTIN) 800 MG tablet Take 800 mg by mouth. 1 tablet in the morning and 2 tablets at bedtime.    Marland Kitchen. glucagon (GLUCAGON EMERGENCY) 1 MG injection Inject 1 mg into the muscle once as needed. 1 each 12  . glucose blood (FREESTYLE  LITE) test strip USE TO CHECK BLOOD SUGAR 6 TIMES PER DAY 600 each 2  . insulin aspart (NOVOLOG FLEXPEN) 100 UNIT/ML FlexPen Inject 10 Units into the skin daily after breakfast AND 8 Units daily before lunch AND 10 Units daily before supper. Max daily 40 units. 45 mL 3  . Insulin Glargine (LANTUS SOLOSTAR) 100 UNIT/ML Solostar Pen Inject 34 Units into the skin daily. 30 mL 3  . Insulin Syringes, Disposable, U-100 1 ML MISC To use for insulin injection.. 100 each 1  . Lancets (FREESTYLE) lancets USE TO TEST BLOOD SUGAR 4 TO 6 TIMES DAILY AS INSTRUCTED 300 each 6  . metFORMIN (GLUCOPHAGE) 500 MG tablet Take 1 tablet (500 mg total) by mouth 2 (two) times daily with a meal. 180 tablet 11  . omeprazole (PRILOSEC) 40 MG capsule Take 1 capsule (40 mg total) by mouth 2 (two) times daily. 180 capsule 3  . ondansetron (ZOFRAN-ODT) 4 MG disintegrating tablet PLACE 1 TO 2 TABLETS ON TONGUE EVERY 4 TO 6 HOURS AS NEEDED FOR NAUSEA 180 tablet 4  . OSCIMIN 0.125 MG SUBL DISSOLVE 1 TABLET UNDER THE TONGUE EVERY 4 HOURS AS NEEDED (NEED APPOINTMENT) 360 tablet 5  . Plecanatide (TRULANCE) 3 MG TABS Take 3 mg by mouth daily. 90 tablet 1  . Probiotic CAPS Take 1 capsule by mouth daily.  30 capsule 1  . traZODone (DESYREL) 100 MG tablet 1  qhs  May increse to 2  qhs  After 1 week 180 tablet 1   No current facility-administered medications for this visit.     Past Medical History:  Diagnosis Date  . Anxiety   . Bipolar 1 disorder (HCC)   . Breast density 06/25/2012   Right breast density, will get mammogram and [place]  . Depression   . Diabetes mellitus without complication (HCC)   . Duodenitis   . HTN (hypertension)   . Hyperlipidemia   . IBS (irritable bowel syndrome)     Past Surgical History:  Procedure Laterality Date  . ABDOMINAL HYSTERECTOMY    . CHOLECYSTECTOMY    . COLONOSCOPY      Social History   Socioeconomic History  . Marital status: Married    Spouse name: phillip  . Number of children: 1   . Years of education: 14  . Highest education level: High school graduate  Occupational History  . Occupation: Disabled  Social Needs  . Financial resource strain: Not hard at all  . Food insecurity    Worry: Never true    Inability: Never true  . Transportation needs    Medical: Yes    Non-medical: Yes  Tobacco Use  . Smoking status: Never Smoker  . Smokeless tobacco: Never Used  Substance and Sexual Activity  . Alcohol use: No    Alcohol/week: 0.0 standard drinks  . Drug use: No  . Sexual activity: Not Currently  Lifestyle  . Physical activity    Days per week: 0 days    Minutes per session: 0 min  . Stress: Not on file  Relationships  . Social Musician on phone: Never    Gets together: Never    Attends religious service: More than 4 times per year    Active member of club or organization: No    Attends meetings of clubs or organizations: Never    Relationship status: Married  . Intimate partner violence    Fear of current or ex partner: No    Emotionally abused: No    Physically abused: No    Forced sexual activity: No  Other Topics Concern  . Not on file  Social History Narrative   Lives at home w/ her husband   Left-handed   Caffeine: 1-2 cups of coffee daily     No family history of premature CAD in 1st degree relatives.  Current Meds  Medication Sig  . ALPRAZolam (XANAX) 1 MG tablet TK 1 T PO QID PRN  . AMBULATORY NON FORMULARY MEDICATION Squatty Potty x 1  . amLODipine (NORVASC) 5 MG tablet Take 5 mg by mouth daily.  . BD PEN NEEDLE NANO U/F 32G X 4 MM MISC USE WITH LANTUS AND NOVOLOG PENS  . BENICAR 40 MG tablet   . desipramine (NORPRAMIN) 25 MG tablet 3  qam  . gabapentin (NEURONTIN) 800 MG tablet Take 800 mg by mouth. 1 tablet in the morning and 2 tablets at bedtime.  Marland Kitchen glucagon (GLUCAGON EMERGENCY) 1 MG injection Inject 1 mg into the muscle once as needed.  Marland Kitchen glucose blood (FREESTYLE LITE) test strip USE TO CHECK BLOOD SUGAR 6 TIMES  PER DAY  . insulin aspart (NOVOLOG FLEXPEN) 100 UNIT/ML FlexPen Inject 10 Units into the skin daily after breakfast AND 8 Units daily before lunch AND 10 Units daily before supper. Max daily 40 units.  . Insulin Glargine (  LANTUS SOLOSTAR) 100 UNIT/ML Solostar Pen Inject 34 Units into the skin daily.  . Insulin Syringes, Disposable, U-100 1 ML MISC To use for insulin injection..  . Lancets (FREESTYLE) lancets USE TO TEST BLOOD SUGAR 4 TO 6 TIMES DAILY AS INSTRUCTED  . metFORMIN (GLUCOPHAGE) 500 MG tablet Take 1 tablet (500 mg total) by mouth 2 (two) times daily with a meal.  . omeprazole (PRILOSEC) 40 MG capsule Take 1 capsule (40 mg total) by mouth 2 (two) times daily.  . ondansetron (ZOFRAN-ODT) 4 MG disintegrating tablet PLACE 1 TO 2 TABLETS ON TONGUE EVERY 4 TO 6 HOURS AS NEEDED FOR NAUSEA  . OSCIMIN 0.125 MG SUBL DISSOLVE 1 TABLET UNDER THE TONGUE EVERY 4 HOURS AS NEEDED (NEED APPOINTMENT)  . Plecanatide (TRULANCE) 3 MG TABS Take 3 mg by mouth daily.  . Probiotic CAPS Take 1 capsule by mouth daily.  . traZODone (DESYREL) 100 MG tablet 1  qhs  May increse to 2  qhs  After 1 week  . [DISCONTINUED] ALPRAZolam (XANAX) 0.5 MG tablet TK 1 T PO TID PRN FOR 30 DAYS      Review of systems complete and found to be negative unless listed above in HPI    Physical exam Blood pressure 95/63, pulse 93, temperature (!) 97.1 F (36.2 C), temperature source Temporal, height 5\' 4"  (1.626 m), weight 168 lb (76.2 kg), SpO2 98 %. General: NAD Neck: No JVD, no thyromegaly or thyroid nodule.  Lungs: Clear to auscultation bilaterally with normal respiratory effort. CV: Nondisplaced PMI. Regular rate and rhythm, normal S1/S2, no S3/S4, no murmur.  No peripheral edema.  No carotid bruit.    Abdomen: Soft, nontender, no distention.  Skin: Intact without lesions or rashes.  Neurologic: Alert and oriented x 3.  Psych: Normal affect. Extremities: No clubbing or cyanosis.  HEENT: Normal.   ECG: Most recent ECG  reviewed.   Labs: Lab Results  Component Value Date/Time   K 3.8 02/25/2018 01:59 AM   BUN 20 02/25/2018 01:59 AM   CREATININE 0.76 02/25/2018 01:59 AM   CREATININE 0.75 12/19/2017 09:54 AM   ALT 17 02/25/2018 01:59 AM   TSH 0.57 11/07/2016 10:22 AM   HGB 10.8 (L) 02/25/2018 01:59 AM     Lipids: Lab Results  Component Value Date/Time   LDLCALC 77 12/19/2017 09:54 AM   CHOL 152 12/19/2017 09:54 AM   TRIG 171.0 (H) 12/19/2017 09:54 AM   HDL 41.30 12/19/2017 09:54 AM        ASSESSMENT AND PLAN:   1.  Syncope: Blood pressure is low at 95/63.  I will stop amlodipine.  She does have insulin-dependent diabetes mellitus and may have developed an autonomic neuropathy.  She denies any antecedent symptoms to suggest ischemic heart disease.  I will obtain a 30-day event monitor.  2.  Hypertension: Blood pressure is low at 95/63.  I will stop amlodipine.  3.  Insulin-dependent diabetes mellitus: She has neuropathy of her legs and feet.   Disposition: Follow up in 3 months  Signed: Kate Sable, M.D., F.A.C.C.  10/17/2018, 10:22 AM

## 2018-10-20 NOTE — Telephone Encounter (Signed)
Can she identify a cause for the increase? If not, I would suggest to go back to the previous dose of Lantus, 34 units, and may need even higher doses of mealtime insulin, for example 10 or 11 units if sugars stay high throughout the day.  Please let us know how this is going in the next 2 to 3 days.

## 2018-10-20 NOTE — Telephone Encounter (Signed)
Per patient for the last week her glucose readings have been between 180-300, this has been consistent through the day, she is following her medications instructions:  Lantus34 >> 30 units at bedtime - NovoLog:  9 units in am 8 units with lunch 9 units with dinner + NovoLog sliding scale

## 2018-10-20 NOTE — Telephone Encounter (Signed)
Need to know what her readings have been for the last week.  Left message for patient to return our call at [phone].

## 2018-10-21 ENCOUNTER — Encounter: Payer: Self-pay | Admitting: Internal Medicine

## 2018-10-21 NOTE — Telephone Encounter (Signed)
Patient requests to be called at ph# [phone] re: blood sugars have running high. Patient available today and tomorrow.

## 2018-10-21 NOTE — Telephone Encounter (Signed)
Please refer to the already open encounter regarding this issue with Dr. Cruzita Lederer.

## 2018-10-21 NOTE — Telephone Encounter (Signed)
Patient can not identify a cause, she will adjust her insulin as per below and check back with [place] in a few days.

## 2018-10-27 ENCOUNTER — Telehealth: Payer: Self-pay | Admitting: Cardiovascular Disease

## 2018-10-27 NOTE — Telephone Encounter (Signed)
Pt left voicemail stating she's having problems getting her monitor on and that she wants to know if she's supposed to wear it for 3 months.   Please call [phone]

## 2018-10-28 NOTE — Telephone Encounter (Signed)
LMTCB-CC 

## 2018-10-29 ENCOUNTER — Encounter: Payer: Self-pay | Admitting: *Deleted

## 2018-10-29 NOTE — Telephone Encounter (Signed)
Pt coming on Monday at 415 pm to have monitor placed

## 2018-10-30 ENCOUNTER — Telehealth: Payer: Self-pay

## 2018-10-30 NOTE — Telephone Encounter (Signed)
Please advise 

## 2018-10-30 NOTE — Telephone Encounter (Signed)
Patient called in wanting to let the Dr know her sugars have been a little higher than normal. They highest 408 lunch time yesterday. Checked this morning and it was 325. She is feeling abnormal about it and would like someone to call her..   Please call and advise

## 2018-10-30 NOTE — Telephone Encounter (Signed)
Called and discussed with the patient.  She has seen higher blood sugars yesterday and this morning.  They are starting to improve now.  Reviewed current insulin regimen: -Lantus 34 units at bedtime -NovoLog 10 units before breakfast, 8 units before lunch, 10 units before dinner  Sugars at home are (before breakfast, before lunch, before dinner, bedtime): 10/28/2018: 119, 270, 250, 190 10/29/2018: 318, 408, 361, 75 10/30/2018: 325, 175  I advised her to increase the dose of NovoLog in the morning to 10 units before a smaller breakfast and 12 units for regular breakfast.  We will keep the rest of the regimen the same.

## 2018-10-31 ENCOUNTER — Other Ambulatory Visit: Payer: Self-pay | Admitting: Physician Assistant

## 2018-11-03 ENCOUNTER — Telehealth: Payer: Self-pay

## 2018-11-03 ENCOUNTER — Other Ambulatory Visit: Payer: Self-pay

## 2018-11-03 ENCOUNTER — Ambulatory Visit: Payer: Medicare Other

## 2018-11-03 ENCOUNTER — Ambulatory Visit (INDEPENDENT_AMBULATORY_CARE_PROVIDER_SITE_OTHER): Payer: Medicare Other

## 2018-11-03 DIAGNOSIS — R55 Syncope and collapse: Secondary | ICD-10-CM

## 2018-11-03 NOTE — Telephone Encounter (Signed)
Patient called in stating that she has used new indulin routine and it has been 200 or higher for the last 7 days and she went back to old insulin dosage and her number has been still high but better still in the 200's.  please call and advise patient is very concerned

## 2018-11-03 NOTE — Telephone Encounter (Signed)
Left message for patient to return our call at [phone].  

## 2018-11-03 NOTE — Telephone Encounter (Signed)
We can try to split Lantus into 12 units in a.m. and 22 units at bedtime.

## 2018-11-04 NOTE — Telephone Encounter (Signed)
Notified patient of message from Dr. Gherghe, patient expressed understanding and agreement. No further questions.  

## 2018-11-05 ENCOUNTER — Other Ambulatory Visit: Payer: Self-pay

## 2018-11-05 ENCOUNTER — Ambulatory Visit (INDEPENDENT_AMBULATORY_CARE_PROVIDER_SITE_OTHER): Payer: Medicare Other | Admitting: Internal Medicine

## 2018-11-05 ENCOUNTER — Encounter: Payer: Self-pay | Admitting: Internal Medicine

## 2018-11-05 VITALS — BP 190/100 | HR 100 | Ht 64.0 in | Wt 167.0 lb

## 2018-11-05 DIAGNOSIS — G63 Polyneuropathy in diseases classified elsewhere: Secondary | ICD-10-CM

## 2018-11-05 DIAGNOSIS — E785 Hyperlipidemia, unspecified: Secondary | ICD-10-CM | POA: Diagnosis not present

## 2018-11-05 DIAGNOSIS — E139 Other specified diabetes mellitus without complications: Secondary | ICD-10-CM | POA: Diagnosis not present

## 2018-11-05 DIAGNOSIS — I10 Essential (primary) hypertension: Secondary | ICD-10-CM | POA: Diagnosis not present

## 2018-11-05 LAB — POCT GLYCOSYLATED HEMOGLOBIN (HGB A1C): Hemoglobin A1C: 6.7 % — AB (ref 4.0–5.6)

## 2018-11-05 NOTE — Progress Notes (Signed)
Patient ID: [name], female   DOB: November 05, 1955, 63 y.o.   MRN: [id number]  HPI: [name] is a 63 y.o.-year-old female, initially referred by her PCP, Dr. Assunta [name] (PA: Lenise Herald), returning for f/u for LADA, dx 2005, insulin-dependent since ~2006, controlled, with complications (PN, gastroparesis?).  Last visit 2.5 months ago  Her diabetes management is limited by her anxiety/panic attacks.  Sugars become very high when she is stressed.  She called [place] in the last 2 days with high blood sugars and at today's visit, pulse and blood pressure are high.  She also appears more anxious than before.  Reviewed history:  She was on an Omnipod insulin pump on 08/01/2015. She had severe anxiety and depression and got so overwhelmed with the insulin pump that we had to take her off the pump.   We switched her to a basal-bolus insulin regimen, but she was unable to calculate the insulin doses based on insulin to carb ratio, so now she is using fixed rapid acting insulin doses.  Reviewed previous HbA1c and fructosamine levels: Lab Results  Component Value Date   HGBA1C 6.6 (A) 08/15/2018   HGBA1C 6.0 (A) 12/19/2017   HGBA1C 5.6 11/07/2016  03/04/2018:  Hba1c calculated from fructosamine is the best in a long time, at 6.84%! 12/19/2017: HbA1c calculated from fructosamine has improved to 7.1%! 09/12/2017: HbA1c  Calculated from the fructosamine is 7.5%, improved from before. 05/07/2017: HbA1c calculated from the fructosamine was 7.7% 02/07/2017: HbA1c calculated from the fructosamine was higher, at 7.7%. 11/07/2016: HbA1c calc. from the fructosamine is a little lower, at 7.25% 08/07/2016: HbA1c calculated from the fructosamine is 7.5% 05/31/2016: HbA1c calculated from fructosamine is better, at 7.25% 12/15/2015: HbA1c calculated from the fructosamine is higher, 8.4%. 08/24/2015:  HbA1c calculated from fructosamine is 7.4%. 05/24/2015: HbA1c calculated from fructosamine is 6.9%.  She is  on: - Metformin 1000 mg with dinner - Lantus 32-34 units at bedtime - NovoLog:  9-11 >> 10-12 units in am 5-7 >> 8-10 units with lunch 9-11 >> 10-12 units with dinner - Novolog Sliding scale: 201-250: + 2 units 251-300: + 3 units 301-350: + 4 units >350: + 5 units   Pt checks her sugars more than 4 times a day per review of her log that sugars are definitely higher than before and she also has significant lows now: - am:  41, 122-193, 204, 227 >> 117-159 >> 55, 183-399 - 2h after b'fast: 98 >> 342 x1 >> n/c >> 211 >> 227, 346 - before lunch:  59, 67, 86-192, 215, 264 >> 100-160 >> 71, 109-306, 408 - 2h after lunch:  128 >> n/c >> 63, 121 >> n/c - before dinner:  63-191, 223, 263 >> 150-170 >> 47, 89-250, 350, 361 - 2h after dinner: 211-365 >> 162 >> n/c >> 211 - bedtime: 55, 63, 99-216, 286, 299 >> <200 >> 31, 34, 91-344 - at night: 116 >> 82, 250 >> 90 >> n/c Lowest sugar was 38 >> 43 >> 42 >> 41 >> 100 >> 31; she has hypoglycemia awareness in the 50s Highest sugar was 331 >> 316  >> 288 >> 299 >> 210 >> 408.  No CKD, last BUN/creatinine:  Lab Results  Component Value Date   BUN 20 02/25/2018   CREATININE 0.76 02/25/2018   Normal ACR: Lab Results  Component Value Date   MICRALBCREAT 5.4 11/07/2016    + HL; lipids: Lab Results  Component Value Date   CHOL 152 12/19/2017  HDL 41.30 12/19/2017   LDLCALC 77 12/19/2017   TRIG 171.0 (H) 12/19/2017   CHOLHDL 4 12/19/2017  She is not on a statin.  -Last eye exam was in 11/2017: + Small cataract, no DR -[facility]: Dr. Janne Lab.  -+ Numbness and tingling in her feet.  Occasional pain.  On Neurontin She is seeing a podiatrist [facility]).  2 toenails removed before last visit due to fungal infection.  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 She also has a dx of Parkinson ds.  She also has bipolar disease. Also: GERD, HTN, Fibromyalgia >> on Neurontin.  Latest TSH was normal: Lab Results   Component Value Date   TSH 0.57 11/07/2016   ROS: Constitutional: no weight gain/no weight loss, no fatigue, no subjective hyperthermia, no subjective hypothermia Eyes: no blurry vision, no xerophthalmia ENT: no sore throat, no nodules palpated in neck, no dysphagia, no odynophagia, no hoarseness Cardiovascular: no CP/no SOB/no palpitations/no leg swelling Respiratory: no cough/no SOB/no wheezing Gastrointestinal: no N/no V/no D/no C/no acid reflux Musculoskeletal: no muscle aches/no joint aches Skin: no rashes, no hair loss Neurological: no tremors/+ numbness/+ tingling/no dizziness  I reviewed pt's medications, allergies, PMH, social hx, family hx, and changes were documented in the history of present illness. Otherwise, unchanged from my initial visit note.  Past Medical History:  Diagnosis Date  . Anxiety   . Bipolar 1 disorder (HCC)   . Breast density 06/25/2012   Right breast density, will get mammogram and [place]  . Depression   . Diabetes mellitus without complication (HCC)   . Duodenitis   . HTN (hypertension)   . Hyperlipidemia   . IBS (irritable bowel syndrome)    Past Surgical History:  Procedure Laterality Date  . ABDOMINAL HYSTERECTOMY    . CHOLECYSTECTOMY    . COLONOSCOPY     Social History   Socioeconomic History  . Marital status: Married    Spouse name: phillip  . Number of children: 1  . Years of education: 56  . Highest education level: High school graduate  Occupational History  . Occupation: Disabled  Social Needs  . Financial resource strain: Not hard at all  . Food insecurity    Worry: Never true    Inability: Never true  . Transportation needs    Medical: Yes    Non-medical: Yes  Tobacco Use  . Smoking status: Never Smoker  . Smokeless tobacco: Never Used  Substance and Sexual Activity  . Alcohol use: No    Alcohol/week: 0.0 standard drinks  . Drug use: No  . Sexual activity: Not Currently  Lifestyle  . Physical activity    Days per  week: 0 days    Minutes per session: 0 min  . Stress: Not on file  Relationships  . Social Musician on phone: Never    Gets together: Never    Attends religious service: More than 4 times per year    Active member of club or organization: No    Attends meetings of clubs or organizations: Never    Relationship status: Married  . Intimate partner violence    Fear of current or ex partner: No    Emotionally abused: No    Physically abused: No    Forced sexual activity: No  Other Topics Concern  . Not on file  Social History Narrative   Lives at home w/ her husband   Left-handed   Caffeine: 1-2 cups of  coffee daily   Current Outpatient Medications on File Prior to Visit  Medication Sig Dispense Refill  . ALPRAZolam (XANAX) 1 MG tablet TK 1 T PO QID PRN    . AMBULATORY NON FORMULARY MEDICATION Squatty Potty x 1 1 each 0  . BD PEN NEEDLE NANO U/F 32G X 4 MM MISC USE WITH LANTUS AND NOVOLOG PENS 200 each 3  . BENICAR 40 MG tablet     . desipramine (NORPRAMIN) 25 MG tablet 3  qam 270 tablet 0  . gabapentin (NEURONTIN) 800 MG tablet Take 800 mg by mouth. 1 tablet in the morning and 2 tablets at bedtime.    Marland Kitchen glucagon (GLUCAGON EMERGENCY) 1 MG injection Inject 1 mg into the muscle once as needed. 1 each 12  . glucose blood (FREESTYLE LITE) test strip USE TO CHECK BLOOD SUGAR 6 TIMES PER DAY 600 each 2  . insulin aspart (NOVOLOG FLEXPEN) 100 UNIT/ML FlexPen Inject 10 Units into the skin daily after breakfast AND 8 Units daily before lunch AND 10 Units daily before supper. Max daily 40 units. 45 mL 3  . Insulin Glargine (LANTUS SOLOSTAR) 100 UNIT/ML Solostar Pen Inject 34 Units into the skin daily. 30 mL 3  . Insulin Syringes, Disposable, U-100 1 ML MISC To use for insulin injection.. 100 each 1  . Lancets (FREESTYLE) lancets USE TO TEST BLOOD SUGAR 4 TO 6 TIMES DAILY AS INSTRUCTED 300 each 6  . metFORMIN (GLUCOPHAGE) 500 MG tablet Take 1 tablet (500 mg total) by mouth 2 (two)  times daily with a meal. 180 tablet 11  . omeprazole (PRILOSEC) 40 MG capsule Take 1 capsule (40 mg total) by mouth 2 (two) times daily. 180 capsule 3  . ondansetron (ZOFRAN-ODT) 4 MG disintegrating tablet PLACE 1 TO 2 TABLETS ON TONGUE EVERY 4 TO 6 HOURS AS NEEDED FOR NAUSEA 180 tablet 4  . OSCIMIN 0.125 MG SUBL DISSOLVE 1 TABLET UNDER THE TONGUE EVERY 4 HOURS AS NEEDED (NEED APPOINTMENT) 360 tablet 5  . Plecanatide (TRULANCE) 3 MG TABS Take 3 mg by mouth daily. 90 tablet 1  . Probiotic CAPS Take 1 capsule by mouth daily. 30 capsule 1  . traZODone (DESYREL) 100 MG tablet 1  qhs  May increse to 2  qhs  After 1 week 180 tablet 1   No current facility-administered medications on file prior to visit.    Allergies  Allergen Reactions  . Abilify [Aripiprazole]     Patient is intolerant  . Reglan [Metoclopramide] Other (See Comments)    Chest pains   Family History  Problem Relation Age of Onset  . Hypertension Mother   . Bipolar disorder Mother   . Cancer Mother   . Heart disease Brother   . Thyroid disease Sister   . Hypertension Sister   . Bipolar disorder Sister   . Depression Father   . Cancer Father   . Bipolar disorder Daughter   . Bipolar disorder Maternal Aunt   . Colon cancer Neg Hx   . Colon polyps Neg Hx   . Esophageal cancer Neg Hx   . Kidney disease Neg Hx   . Gallbladder disease Neg Hx   . Diabetes Neg Hx   . Dementia Neg Hx     PE: BP (!) 190/100   Pulse 100   Ht  (1.626 m)   Wt 167 lb (75.8 kg)   SpO2 97%   BMI 28.67 kg/m    Wt Readings from Last 3 Encounters:  11/05/18 167 lb (75.8 kg)  10/17/18 168 lb (76.2 kg)  08/29/18 168 lb 12.8 oz (76.6 kg)   Constitutional: overweight, in NAD Eyes: PERRLA, EOMI, no exophthalmos ENT: moist mucous membranes, no thyromegaly, no cervical lymphadenopathy Cardiovascular: RRR, No MRG Respiratory: CTA B Gastrointestinal: abdomen soft, NT, ND, BS+ Musculoskeletal: no deformities, strength intact in all  4 Skin: moist, warm, no rashes Neurological: no tremor with outstretched hands, DTR normal in all 4  ASSESSMENT: 1. LADA, insulin-dependent, uncontrolled, with complications - peripheral neuropathy - gastroparesis?  Component     Latest Ref Rng 07/16/2013  C-Peptide     0.80 - 3.90 ng/mL 1.29  Glucose     70 - 99 mg/dL 161183 (H)  Glutamic Acid Decarb Ab     <=1.0 U/mL 11.3 (H)  Pancreatic Islet Cell Antibody     <5 JDF Units 5 (A)  + anti-pancreatic antibodies >> LADA rather than type 2 DM. She still has a positive C peptide >> still has insulin secretion. For this reason, we continued the Metformin.  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 HL  4.  HTN  PLAN:  1.  Complex patient with very difficult to control LADA, complicated by anxiety and depression.  She is on a basal/bolus insulin regimen and also metformin.  At last visit, her sugars were much improved compared to before without a clear reason.  At this visit, they started to increase, again, without a possible cause other than intensification of her anxiety.  She is not seeing her previous psychiatrist anymore (Dr. Donell BeersPlovsky) and she is in search for another psychiatrist.  She continues to see her counselor.  At this visit, her blood pressure is high (she was taken off Norvasc recently) and also her heart rate is high.  She also appears more anxious. -Reviewing her sugar log, the sugars are quite fluctuating, but mostly high.  Sugars in the 200s and 300s are followed by occasional sugars in the 30s to 50s, which limits our ability to efficiently manage the hyperglycemia.  Sugars in the morning are much higher compared to last visit despite using the same dose of Lantus and not eating more at night.  The 1 change that she made since last visit is to change to a cereal bowl that I suggested (kamut puffs, fruit, almond milk, chia seeds).  Therefore, at this visit I advised her to try to go back to the previous practice to see if this may help.  I also advised  him to try to save her insulin has not expired and to try to inject insulin on the sides of her abdomen since she usually uses the front part of her abdomen.  Otherwise, we absolutely need to get her anxiety and stress under control since this is directly correlated with her sugar control. - I suggested to:  Patient Instructions  Please continue: - Metformin 1000 mg with dinner - Lantus 34 units at bedtime - NovoLog:  10-12 units in am 8-10 units with lunch 10-12 units with dinner - Novolog Sliding scale: 201-250: + 2 units 251-300: + 3 units 301-350: + 4 units >350: + 5 units  Please try to stop the morning cereal x 1 week and change to your previous b'fast.  Check if your insulin is not expired.  Move the injection site to the lateral abdomen.  Please return in 3 months with your sugar log.   - HbA1c today is 6.7% (stable) - Continues to check sugars 4 times a day -  Advised for yearly eye exams >> she is up-to-date - return to clinic in 3-4 months   2. PN  -Related to diabetes -Continues Neurontin without side effects -I did recommend alpha-lipoic acid 600 mg twice a day at last visits but she did not start this    3. HL -Reviewed latest lipid panel from 12/2017: LDL at goal, triglycerides slightly high Lab Results  Component Value Date   CHOL 152 12/19/2017   HDL 41.30 12/19/2017   LDLCALC 77 12/19/2017   TRIG 171.0 (H) 12/19/2017   CHOLHDL 4 12/19/2017  -She is not on a statin.  This is managed by her cardiologist.  4. HTN -Blood pressure is quite high today, 190/100.  No headache.  No blurry vision. -Advised her to go home immediately and take 5 mg of Norvasc.  Then she needs to contact her cardiologist for further management.  Philemon Kingdom, MD PhD [facility] Endocrinology

## 2018-11-05 NOTE — Patient Instructions (Addendum)
Please continue: - Metformin 1000 mg with dinner - Lantus 34 units at bedtime - NovoLog:  10-12 units in am 8-10 units with lunch 10-12 units with dinner - Novolog Sliding scale: 201-250: + 2 units 251-300: + 3 units 301-350: + 4 units >350: + 5 units  Please try to stop the morning cereal x 1 week and change to your previous b'fast.  Check if your insulin is not expired.  Move the injection site to the lateral abdomen.  Please return in 3 months with your sugar log.

## 2018-11-05 NOTE — Addendum Note (Signed)
Addended by: Cardell Peach I on: 11/05/2018 12:38 PM   Modules accepted: Orders

## 2018-11-12 ENCOUNTER — Ambulatory Visit: Payer: Medicare Other | Admitting: Gastroenterology

## 2018-11-30 ENCOUNTER — Telehealth: Payer: Self-pay | Admitting: Physician Assistant

## 2018-11-30 NOTE — Telephone Encounter (Signed)
The monitor company called saying that [name] had indicated that she had syncope.  Her rhythm was sinus, heart rate slightly elevated, just over 100.  I called Ms. Cassell and she stated that was an error.  She had not passed out and did not think she was going to pass out.  She is currently asymptomatic.  Rosaria Ferries, PA-C 11/30/2018 5:44 PM Beeper [phone]

## 2018-12-15 ENCOUNTER — Ambulatory Visit (INDEPENDENT_AMBULATORY_CARE_PROVIDER_SITE_OTHER): Payer: Medicare Other | Admitting: Gastroenterology

## 2018-12-15 ENCOUNTER — Encounter: Payer: Self-pay | Admitting: Gastroenterology

## 2018-12-15 VITALS — BP 90/60 | HR 100 | Temp 98.4°F | Ht 64.5 in | Wt 161.0 lb

## 2018-12-15 DIAGNOSIS — K5900 Constipation, unspecified: Secondary | ICD-10-CM

## 2018-12-15 DIAGNOSIS — K219 Gastro-esophageal reflux disease without esophagitis: Secondary | ICD-10-CM

## 2018-12-15 DIAGNOSIS — K5904 Chronic idiopathic constipation: Secondary | ICD-10-CM | POA: Diagnosis not present

## 2018-12-15 DIAGNOSIS — K581 Irritable bowel syndrome with constipation: Secondary | ICD-10-CM

## 2018-12-15 MED ORDER — ONDANSETRON 4 MG PO TBDP: ORAL_TABLET | ORAL | 4 refills | Status: AC

## 2018-12-15 NOTE — Progress Notes (Signed)
Sabrina ComptonBarbara L [name]    [id number]    06/02/1955  Primary Care Physician:Fusco, Lyman BishopLawrence, MD  Referring Physician: [name], [name], MD [address] [place] [id number]   Chief complaint:  IBS, Constipation  HPI: 63 year old female with history of chronic constipation, IBS here for follow-up visit.  She was last seen in May 2019. Continues to have difficulty evacuating, has 1 bowel movement per week on average.  She has to take additional Fleet suppositories intermittently and feels Trulance alone is not working.  She is taking fiber supplements intermittently. Denies any nausea, vomiting, abdominal pain, melena or bright red blood per rectum  Cologuard negative May 2018  No family history of colon cancer or GI malignancy.  Outpatient Encounter Medications as of 12/15/2018  Medication Sig  . ALPRAZolam (XANAX) 1 MG tablet TK 1 T PO QID PRN  . AMBULATORY NON FORMULARY MEDICATION Squatty Potty x 1  . BD PEN NEEDLE NANO U/F 32G X 4 MM MISC USE WITH LANTUS AND NOVOLOG PENS  . BENICAR 40 MG tablet   . desipramine (NORPRAMIN) 25 MG tablet 3  qam  . gabapentin (NEURONTIN) 800 MG tablet Take 800 mg by mouth. 1 tablet in the morning and 2 tablets at bedtime.  Marland Kitchen. glucagon (GLUCAGON EMERGENCY) 1 MG injection Inject 1 mg into the muscle once as needed.  Marland Kitchen. glucose blood (FREESTYLE LITE) test strip USE TO CHECK BLOOD SUGAR 6 TIMES PER DAY  . insulin aspart (NOVOLOG FLEXPEN) 100 UNIT/ML FlexPen Inject 10 Units into the skin daily after breakfast AND 8 Units daily before lunch AND 10 Units daily before supper. Max daily 40 units.  . Insulin Glargine (LANTUS SOLOSTAR) 100 UNIT/ML Solostar Pen Inject 34 Units into the skin daily.  . Insulin Syringes, Disposable, U-100 1 ML MISC To use for insulin injection..  . Lancets (FREESTYLE) lancets USE TO TEST BLOOD SUGAR 4 TO 6 TIMES DAILY AS INSTRUCTED  . metFORMIN (GLUCOPHAGE) 500 MG tablet Take 1 tablet (500 mg total) by mouth 2 (two)  times daily with a meal.  . omeprazole (PRILOSEC) 40 MG capsule Take 1 capsule (40 mg total) by mouth 2 (two) times daily.  . ondansetron (ZOFRAN-ODT) 4 MG disintegrating tablet PLACE 1 TO 2 TABLETS ON TONGUE EVERY 4 TO 6 HOURS AS NEEDED FOR NAUSEA  . OSCIMIN 0.125 MG SUBL DISSOLVE 1 TABLET UNDER THE TONGUE EVERY 4 HOURS AS NEEDED (NEED APPOINTMENT)  . Plecanatide (TRULANCE) 3 MG TABS Take 3 mg by mouth daily.  . Probiotic CAPS Take 1 capsule by mouth daily.  . traZODone (DESYREL) 100 MG tablet 1  qhs  May increse to 2  qhs  After 1 week   No facility-administered encounter medications on file as of 12/15/2018.    Allergies as of 12/15/2018 - Review Complete 12/15/2018  Allergen Reaction Noted  . Abilify [aripiprazole]  08/10/2016  . Reglan [metoclopramide] Other (See Comments) 03/23/2013    Past Medical History:  Diagnosis Date  . Anxiety   . Bipolar 1 disorder (HCC)   . Breast density 06/25/2012   Right breast density, will get mammogram and US  . Depression   . Diabetes mellitus without complication (HCC)   . Duodenitis   . HTN (hypertension)   . Hyperlipidemia   . IBS (irritable bowel syndrome)     Past Surgical History:  Procedure Laterality Date  . ABDOMINAL HYSTERECTOMY    . CHOLECYSTECTOMY    . COLONOSCOPY  Family History  Problem Relation Age of Onset  . Hypertension Mother   . Bipolar disorder Mother   . Cancer Mother   . Heart disease Brother   . Thyroid disease Sister   . Hypertension Sister   . Bipolar disorder Sister   . Depression Father   . Cancer Father   . Bipolar disorder Daughter   . Bipolar disorder Maternal Aunt   . Colon cancer Neg Hx   . Colon polyps Neg Hx   . Esophageal cancer Neg Hx   . Kidney disease Neg Hx   . Gallbladder disease Neg Hx   . Diabetes Neg Hx   . Dementia Neg Hx     Social History   Socioeconomic History  . Marital status: Married    Spouse name: phillip  . Number of children: 1  . Years of education: 6    . Highest education level: High school graduate  Occupational History  . Occupation: Disabled  Tobacco Use  . Smoking status: Never Smoker  . Smokeless tobacco: Never Used  Substance and Sexual Activity  . Alcohol use: No    Alcohol/week: 0.0 standard drinks  . Drug use: No  . Sexual activity: Not Currently  Other Topics Concern  . Not on file  Social History Narrative   Lives at home w/ her husband   Left-handed   Caffeine: 1-2 cups of coffee daily   Social Determinants of Health   Financial Resource Strain:   . Difficulty of Paying Living Expenses: Not on file  Food Insecurity:   . Worried About Charity fundraiser in the Last Year: Not on file  . Ran Out of Food in the Last Year: Not on file  Transportation Needs:   . Lack of Transportation (Medical): Not on file  . Lack of Transportation (Non-Medical): Not on file  Physical Activity:   . Days of Exercise per Week: Not on file  . Minutes of Exercise per Session: Not on file  Stress:   . Feeling of Stress : Not on file  Social Connections:   . Frequency of Communication with Friends and Family: Not on file  . Frequency of Social Gatherings with Friends and Family: Not on file  . Attends Religious Services: Not on file  . Active Member of Clubs or Organizations: Not on file  . Attends Archivist Meetings: Not on file  . Marital Status: Not on file  Intimate Partner Violence:   . Fear of Current or Ex-Partner: Not on file  . Emotionally Abused: Not on file  . Physically Abused: Not on file  . Sexually Abused: Not on file      Review of systems: Review of Systems  Constitutional: Negative for fever and chills.  HENT: Positive for ringing in the ear Eyes: Negative for blurred vision.  Respiratory: Negative for cough, shortness of breath and wheezing.   Cardiovascular: Negative for chest pain and palpitations.  Gastrointestinal: as per HPI Genitourinary: Negative for dysuria, urgency, frequency and  hematuria.  Musculoskeletal: Positive for myalgias, back pain and joint pain.  Skin: Negative for itching and rash.  Neurological: Negative for dizziness, tremors, focal weakness, seizures and loss of consciousness.  Endo/Heme/Allergies: Positive for seasonal allergies.  Psychiatric/Behavioral: Negative for suicidal ideas and hallucinations. Positive for depression and anxiety. All other systems reviewed and are negative.   Physical Exam: Vitals:   12/15/18 0859  BP: 90/60  Pulse: 100  Temp: 98.4 F (36.9 C)   Body mass index  is 27.21 kg/m. Gen:      No acute distress HEENT:  EOMI, sclera anicteric Neck:     No masses; no thyromegaly Lungs:    Clear to auscultation bilaterally; normal respiratory effort CV:         Regular rate and rhythm; no murmurs Abd:      + bowel sounds; soft, non-tender; no palpable masses, no distension Ext:    No edema; adequate peripheral perfusion Skin:      Warm and dry; no rash Neuro: alert and oriented x 3 Psych: normal mood and affect  Data Reviewed:  Reviewed labs, radiology imaging, old records and pertinent past GI work up   Assessment and Plan/Recommendations:  63 year old female with history of chronic depression, anxiety disorder, fibromyalgia, insulin-dependent diabetes, IBS with chronic constipation  Continue Trulance 3 mg daily Discussed high-fiber diet and increase fluid intake Add MiraLAX 1 capful twice daily to the bowel regimen Start Benefiber 1 tablespoon twice daily with meals  Colorectal cancer screening due in May 2021, had negative Cologuard in May 2018  GERD: Continue omeprazole 40 mg daily and antireflux measures  Return in 6 months or sooner if needed  25 minutes was spent face-to-face with the patient. Greater than 50% of the time used for counseling as well as treatment plan and follow-up. She had multiple questions which were answered to her satisfaction  K. Scherry Ran , MD    CC: Elfredia Nevins,  MD

## 2018-12-15 NOTE — Patient Instructions (Signed)
Continue Trulance 3 mg daily  Take Miralax 1 capful twice daily  Use Benefiber or Fiberchoice 1 tablet twice daily  Increase water intake to 8-10 cups of water daily  Follow up in 6 months  If you are age 65 or older, your body mass index should be between 23-30. Your Body mass index is 27.21 kg/m. If this is out of the aforementioned range listed, please consider follow up with your Primary Care Provider.  If you are age 35 or younger, your body mass index should be between 19-25. Your Body mass index is 27.21 kg/m. If this is out of the aformentioned range listed, please consider follow up with your Primary Care Provider.    I appreciate the  opportunity to care for you  Thank You   Harl Bowie , MD

## 2018-12-16 ENCOUNTER — Other Ambulatory Visit: Payer: Self-pay

## 2018-12-16 ENCOUNTER — Encounter: Payer: Self-pay | Admitting: Internal Medicine

## 2018-12-16 ENCOUNTER — Ambulatory Visit (INDEPENDENT_AMBULATORY_CARE_PROVIDER_SITE_OTHER): Payer: Medicare Other | Admitting: Internal Medicine

## 2018-12-16 ENCOUNTER — Encounter: Payer: Self-pay | Admitting: Gastroenterology

## 2018-12-16 VITALS — BP 160/80 | HR 89 | Ht 64.5 in | Wt 164.0 lb

## 2018-12-16 DIAGNOSIS — E139 Other specified diabetes mellitus without complications: Secondary | ICD-10-CM | POA: Diagnosis not present

## 2018-12-16 DIAGNOSIS — E785 Hyperlipidemia, unspecified: Secondary | ICD-10-CM | POA: Diagnosis not present

## 2018-12-16 LAB — LIPID PANEL
Cholesterol: 169 mg/dL (ref 0–200)
HDL: 47.9 mg/dL (ref >39.00)
LDL Cholesterol: 99 mg/dL (ref 0–99)
NonHDL: 121.21
Total CHOL/HDL Ratio: 4
Triglycerides: 113 mg/dL (ref 0.0–149.0)
VLDL: 22.6 mg/dL (ref 0.0–40.0)

## 2018-12-16 LAB — TSH: TSH: 0.45 u[IU]/mL (ref 0.35–4.50)

## 2018-12-16 NOTE — Patient Instructions (Addendum)
Please continue: - Metformin 1000 mg with dinner - Lantus 34 units at bedtime - NovoLog:  10-12 units in am 8-10 units with lunch 10-12 units with dinner - Novolog Sliding scale: 201-250: + 2 units 251-300: + 3 units 301-350: + 4 units >350: + 5 units  Move the injection site to the lateral abdomen.  Write comments in your log whenever sugars are unexpectedly high or low.  Please return in 3 months with your sugar log.

## 2018-12-16 NOTE — Progress Notes (Signed)
Patient ID: [name], female   DOB: [birth year], 63 y.o.   MRN: [id number]  This visit occurred during the SARS-CoV-2 public health emergency.  Safety protocols were in place, including screening questions prior to the visit, additional usage of staff PPE, and extensive cleaning of exam room while observing appropriate contact time as indicated for disinfecting solutions.   HPI: [name] is a 63 y.o.-year-old female, initially referred by her PCP, Dr. Assunta Found (PA: Lenise Herald), returning for f/u for LADA, dx 2005, insulin-dependent since ~2006, controlled, with complications (PN, gastroparesis?).  Last visit 1.5 months ago.  Her diabetes management is limited by her anxiety/panic attacks.  Sugars are very high when she is stressed.  She also has GI issues and sees gastroenterology.  She had a recent appointment with Dr. Lavon Paganini and found out that she may need to have a colectomy.  At last visit, sugars are higher than before, however, since then, they decreased.  Reviewed history:  She was on an Omnipod insulin pump on 08/01/2015. She had severe anxiety and depression and got so overwhelmed with the insulin pump that we had to take her off the pump.   We switched her to a basal-bolus insulin regimen, but she was unable to calculate the insulin doses based on insulin to carb ratio, so now she is using fixed rapid acting insulin doses.  Reviewed previous HbA1c levels:: Lab Results  Component Value Date   HGBA1C 6.7 (A) 11/05/2018   HGBA1C 6.6 (A) 08/15/2018   HGBA1C 6.0 (A) 12/19/2017  03/04/2018:  Hba1c calculated from fructosamine is the best in a long time, at 6.84%! 12/19/2017: HbA1c calculated from fructosamine has improved to 7.1%! 09/12/2017: HbA1c  Calculated from the fructosamine is 7.5%, improved from before. 05/07/2017: HbA1c calculated from the fructosamine was 7.7% 02/07/2017: HbA1c calculated from the fructosamine was higher, at 7.7%. 11/07/2016: HbA1c calc. from  the fructosamine is a little lower, at 7.25% 08/07/2016: HbA1c calculated from the fructosamine is 7.5% 05/31/2016: HbA1c calculated from fructosamine is better, at 7.25% 12/15/2015: HbA1c calculated from the fructosamine is higher, 8.4%. 08/24/2015:  HbA1c calculated from fructosamine is 7.4%. 05/24/2015: HbA1c calculated from fructosamine is 6.9%.  She is on: - Metformin 1000 mg with dinner - Lantus  34 units at bedtime - NovoLog:  10-12 units in am 8-10 units with lunch 10-12 units with dinner - Novolog Sliding scale: 201-250: + 2 units 251-300: + 3 units 301-350: + 4 units >350: + 5 units  Pt checks her sugars more than 4 times a day: - am:  41, 122-193, 204, 227 >> 117-159 >> 55, 183-399 >> 50, 65-249 - 2h after b'fast: 98 >> 342 x1 >> n/c >> 211 >> 227, 346 >> 32, 267 - before lunch:   100-160 >> 71, 109-306, 408 >> 55, 96-220, 267, 313 - 2h after lunch:  128 >> n/c >> 63, 121 >> n/c  - before dinner: 150-170 >> 47, 89-250, 350, 361 >> 71-257, 269 - 2h after dinner: 211-365 >> 162 >> n/c >> 211 >> 49 - bedtime: 55, 63, 99-216, 286, 299 >> <200 >> 31, 34, 91-344 >> 52-259 - at night: 116 >> 82, 250 >> 90 >> n/c >> 165 Lowest sugar was 41 >> 100 >> 31 >> 32; she has hypoglycemia awareness in the 50s. Highest sugar was  210 >> 408 >> 313 x1.  No CKD, last BUN/creatinine:  Lab Results  Component Value Date   BUN 20 02/25/2018   CREATININE 0.76  02/25/2018   Normal ACR: Lab Results  Component Value Date   MICRALBCREAT 5.4 11/07/2016    + HL; lipids: Lab Results  Component Value Date   CHOL 152 12/19/2017   HDL 41.30 12/19/2017   LDLCALC 77 12/19/2017   TRIG 171.0 (H) 12/19/2017   CHOLHDL 4 12/19/2017  She is not on a statin.  -Last eye exam was in 11/2017: + Small cataract, no DR -[facility]: Dr. Janne Lab.  -+ numbness and tingling in her feet.  She has pain occasionally.  On Neurontin. She sees a podiatrist [facility]).  She had 2 toenails  removed in the past due to fungal infection.  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 She also has a dx of Parkinson ds.  She also has bipolar disease. Also: GERD, HTN, fibromyalgia >> on Neurontin.  Latest TSH was reviewed and this was normal: Lab Results  Component Value Date   TSH 0.57 11/07/2016   ROS: Constitutional: no weight gain/no weight loss, no fatigue, no subjective hyperthermia, no subjective hypothermia Eyes: no blurry vision, no xerophthalmia ENT: no sore throat, no nodules palpated in neck, no dysphagia, no odynophagia, no hoarseness Cardiovascular: no CP/no SOB/no palpitations/no leg swelling Respiratory: no cough/no SOB/no wheezing Gastrointestinal: no N/no V/no D/no C/no acid reflux Musculoskeletal: no muscle aches/no joint aches Skin: no rashes, no hair loss Neurological: no tremors/+ numbness/+ tingling/no dizziness  I reviewed pt's medications, allergies, PMH, social hx, family hx, and changes were documented in the history of present illness. Otherwise, unchanged from my initial visit note.  Past Medical History:  Diagnosis Date  . Anxiety   . Bipolar 1 disorder (HCC)   . Breast density 06/25/2012   Right breast density, will get mammogram and [place]  . Depression   . Diabetes mellitus without complication (HCC)   . Duodenitis   . HTN (hypertension)   . Hyperlipidemia   . IBS (irritable bowel syndrome)    Past Surgical History:  Procedure Laterality Date  . ABDOMINAL HYSTERECTOMY    . CHOLECYSTECTOMY    . COLONOSCOPY     Social History   Socioeconomic History  . Marital status: Married    Spouse name: phillip  . Number of children: 1  . Years of education: 75  . Highest education level: High school graduate  Occupational History  . Occupation: Disabled  Tobacco Use  . Smoking status: Never Smoker  . Smokeless tobacco: Never Used  Substance and Sexual Activity  . Alcohol use: No    Alcohol/week: 0.0 standard drinks  . Drug use: No  .  Sexual activity: Not Currently  Other Topics Concern  . Not on file  Social History Narrative   Lives at home w/ her husband   Left-handed   Caffeine: 1-2 cups of coffee daily   Social Determinants of Health   Financial Resource Strain:   . Difficulty of Paying Living Expenses: Not on file  Food Insecurity:   . Worried About Programme researcher, broadcasting/film/video in the Last Year: Not on file  . Ran Out of Food in the Last Year: Not on file  Transportation Needs:   . Lack of Transportation (Medical): Not on file  . Lack of Transportation (Non-Medical): Not on file  Physical Activity:   . Days of Exercise per Week: Not on file  . Minutes of Exercise per Session: Not on file  Stress:   . Feeling of Stress : Not on file  Social Connections:   .  Frequency of Communication with Friends and Family: Not on file  . Frequency of Social Gatherings with Friends and Family: Not on file  . Attends Religious Services: Not on file  . Active Member of Clubs or Organizations: Not on file  . Attends Banker Meetings: Not on file  . Marital Status: Not on file  Intimate Partner Violence:   . Fear of Current or Ex-Partner: Not on file  . Emotionally Abused: Not on file  . Physically Abused: Not on file  . Sexually Abused: Not on file   Current Outpatient Medications on File Prior to Visit  Medication Sig Dispense Refill  . ALPRAZolam (XANAX) 1 MG tablet TK 1 T PO QID PRN    . AMBULATORY NON FORMULARY MEDICATION Squatty Potty x 1 1 each 0  . BD PEN NEEDLE NANO U/F 32G X 4 MM MISC USE WITH LANTUS AND NOVOLOG PENS 200 each 3  . BENICAR 40 MG tablet     . desipramine (NORPRAMIN) 25 MG tablet 3  qam 270 tablet 0  . gabapentin (NEURONTIN) 800 MG tablet Take 800 mg by mouth. 1 tablet in the morning and 2 tablets at bedtime.    Marland Kitchen glucagon (GLUCAGON EMERGENCY) 1 MG injection Inject 1 mg into the muscle once as needed. 1 each 12  . glucose blood (FREESTYLE LITE) test strip USE TO CHECK BLOOD SUGAR 6 TIMES  PER DAY 600 each 2  . insulin aspart (NOVOLOG FLEXPEN) 100 UNIT/ML FlexPen Inject 10 Units into the skin daily after breakfast AND 8 Units daily before lunch AND 10 Units daily before supper. Max daily 40 units. 45 mL 3  . Insulin Glargine (LANTUS SOLOSTAR) 100 UNIT/ML Solostar Pen Inject 34 Units into the skin daily. 30 mL 3  . Insulin Syringes, Disposable, U-100 1 ML MISC To use for insulin injection.. 100 each 1  . Lancets (FREESTYLE) lancets USE TO TEST BLOOD SUGAR 4 TO 6 TIMES DAILY AS INSTRUCTED 300 each 6  . metFORMIN (GLUCOPHAGE) 500 MG tablet Take 1 tablet (500 mg total) by mouth 2 (two) times daily with a meal. 180 tablet 11  . omeprazole (PRILOSEC) 40 MG capsule Take 1 capsule (40 mg total) by mouth 2 (two) times daily. 180 capsule 3  . ondansetron (ZOFRAN-ODT) 4 MG disintegrating tablet PLACE 1 TO 2 TABLETS ON TONGUE EVERY 4 TO 6 HOURS AS NEEDED FOR NAUSEA 180 tablet 4  . OSCIMIN 0.125 MG SUBL DISSOLVE 1 TABLET UNDER THE TONGUE EVERY 4 HOURS AS NEEDED (NEED APPOINTMENT) 360 tablet 5  . Plecanatide (TRULANCE) 3 MG TABS Take 3 mg by mouth daily. 90 tablet 1  . Probiotic CAPS Take 1 capsule by mouth daily. 30 capsule 1  . traZODone (DESYREL) 100 MG tablet 1  qhs  May increse to 2  qhs  After 1 week 180 tablet 1   No current facility-administered medications on file prior to visit.   Allergies  Allergen Reactions  . Abilify [Aripiprazole]     Patient is intolerant  . Reglan [Metoclopramide] Other (See Comments)    Chest pains   Family History  Problem Relation Age of Onset  . Hypertension Mother   . Bipolar disorder Mother   . Cancer Mother   . Heart disease Brother   . Thyroid disease Sister   . Hypertension Sister   . Bipolar disorder Sister   . Depression Father   . Cancer Father   . Bipolar disorder Daughter   . Bipolar disorder Maternal Aunt   .  Colon cancer Neg Hx   . Colon polyps Neg Hx   . Esophageal cancer Neg Hx   . Kidney disease Neg Hx   . Gallbladder  disease Neg Hx   . Diabetes Neg Hx   . Dementia Neg Hx     PE: BP (!) 160/80   Pulse 89   Ht 5' 4.5" (1.638 m)   Wt 164 lb (74.4 kg)   SpO2 99%   BMI 27.72 kg/m    Wt Readings from Last 3 Encounters:  12/16/18 164 lb (74.4 kg)  12/15/18 161 lb (73 kg)  11/05/18 167 lb (75.8 kg)   Constitutional: overweight, in NAD Eyes: PERRLA, EOMI, no exophthalmos ENT: moist mucous membranes, no thyromegaly, no cervical lymphadenopathy Cardiovascular: RRR, No MRG Respiratory: CTA B Gastrointestinal: abdomen soft, NT, ND, BS+ Musculoskeletal: no deformities, strength intact in all 4 Skin: moist, warm, no rashes Neurological: no tremor with outstretched hands, DTR normal in all 4  ASSESSMENT: 1. LADA, insulin-dependent, uncontrolled, with complications - peripheral neuropathy - gastroparesis?  Component     Latest Ref Rng 07/16/2013  C-Peptide     0.80 - 3.90 ng/mL 1.29  Glucose     70 - 99 mg/dL 161183 (H)  Glutamic Acid Decarb Ab     <=1.0 U/mL 11.3 (H)  Pancreatic Islet Cell Antibody     <5 JDF Units 5 (A)  + anti-pancreatic antibodies >> LADA rather than type 2 DM. She still has a positive C peptide >> still has insulin secretion. For this reason, we continued the Metformin.  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 HL  4.  HTN  PLAN:  1.  Complex patient with very difficult to control LADA, complicated by anxiety and depression.  She is on a basal-bolus insulin regimen and also Metformin.  We tried to have her on insulin pump before or an adjustable regimen based on insulin to carb ratios, but this did not work well for her in the past as it was giving her anxiety. She is now on fixed doses of NovoLog before meals.  She also has a sliding scale which she does use. -At last visit, sugars were quite fluctuating, but mostly high.  Sugars in the 200s to 300s were followed by occasional low blood sugars in the 30s to 50s, which limited our ability to efficiently manage her hyperglycemia.  Sugars in the  morning were much higher compared to the visit before, so I advised her to check if her insulin was not expired, to move the injection sites to the lateral abdomen and also to see if she could change breakfast.  Otherwise, we did not change her regimen.  At this visit, she tells me that her insulin is not expired and she is experimenting with different breakfast.  However, she did not the injection site and I explained again that she may have developed scar tissue which is conducive to fluctuations in the insulin absorption and therefore hyper and hypoglycemia.  We again discussed about moving the injection sites to the sides of the abdomen and even lower back.  She agrees to do so. -At this visit, sugars improved significantly and she again developed low blood sugars.  However, these appear out of the blue, without an identifiable culprit.  She is doing a great job writing the sugars down in her log approximately 4 times a day, however, she is not writing comments clinic her sugars are out of the ordinary for her.  We discussed about the importance  of doing so.  I advised her to write comment whenever her sugars are unexpectedly high or low.  She agrees to do so. - I suggested to:  Patient Instructions  Please continue: - Metformin 1000 mg with dinner - Lantus 34 units at bedtime - NovoLog:  10-12 units in am 8-10 units with lunch 10-12 units with dinner - Novolog Sliding scale: 201-250: + 2 units 251-300: + 3 units 301-350: + 4 units >350: + 5 units  Move the injection site to the lateral abdomen.  Write comments in your log whenever sugars are unexpectedly high or low.  Please return in 3 months with your sugar log.   - advised to check sugars at different times of the day - 4x a day, rotating check times - advised for yearly eye exams >> she is not UTD - We will check annual labs - return to clinic in 3 months   2. PN  -Due to diabetes - continues Neurontin without side effects -I  recommended alpha-lipoic acid 600 mg twice a day before, but she did not start this  3. HL -Lipid panel from 12/2017: LDL at goal, triglycerides slightly high: Lab Results  Component Value Date   CHOL 152 12/19/2017   HDL 41.30 12/19/2017   LDLCALC 77 12/19/2017   TRIG 171.0 (H) 12/19/2017   CHOLHDL 4 12/19/2017  -She is not on a statin.  This is managed by her cardiologist. -Today we will check another set of lipids per her request  Component     Latest Ref Rng & Units 12/16/2018  Glucose     65 - 99 mg/dL 260 (H)  BUN     7 - 25 mg/dL 32 (H)  Creatinine     0.50 - 0.99 mg/dL 0.82  GFR, Est Non African American     > OR = 60 mL/min/1.87m2 75  GFR, Est African American     > OR = 60 mL/min/1.62m2 87  BUN/Creatinine Ratio     6 - 22 (calc) 39 (H)  Sodium     135 - 146 mmol/L 139  Potassium     3.5 - 5.3 mmol/L 4.0  Chloride     98 - 110 mmol/L 99  CO2     20 - 32 mmol/L 31  Calcium     8.6 - 10.4 mg/dL 9.5  Total Protein     6.1 - 8.1 g/dL 6.7  Albumin MSPROF     3.6 - 5.1 g/dL 4.3  Globulin     1.9 - 3.7 g/dL (calc) 2.4  AG Ratio     1.0 - 2.5 (calc) 1.8  Total Bilirubin     0.2 - 1.2 mg/dL 0.3  Alkaline phosphatase (APISO)     37 - 153 U/L 88  AST     10 - 35 U/L 15  ALT     6 - 29 U/L 15  Cholesterol     0 - 200 mg/dL 169  Triglycerides     0.0 - 149.0 mg/dL 113.0  HDL Cholesterol     >39.00 mg/dL 47.90  VLDL     0.0 - 40.0 mg/dL 22.6  LDL (calc)     0 - 99 mg/dL 99  Total CHOL/HDL Ratio      4  NonHDL      121.21  Fructosamine     205 - 285 umol/L 333 (H)  TSH     0.35 - 4.50 uIU/mL 0.45   Glucose high.  Rest of the labs at goal, with the exception of fructosamine.  HbA1c calculated from fructosamine is 7.25%.  Carlus Pavlov, MD PhD [facility] End[facility]

## 2018-12-22 LAB — COMPLETE METABOLIC PANEL WITH GFR
AG Ratio: 1.8 (calc) (ref 1.0–2.5)
ALT: 15 U/L (ref 6–29)
AST: 15 U/L (ref 10–35)
Albumin: 4.3 g/dL (ref 3.6–5.1)
Alkaline phosphatase (APISO): 88 U/L (ref 37–153)
BUN/Creatinine Ratio: 39 (calc) — ABNORMAL HIGH (ref 6–22)
BUN: 32 mg/dL — ABNORMAL HIGH (ref 7–25)
CO2: 31 mmol/L (ref 20–32)
Calcium: 9.5 mg/dL (ref 8.6–10.4)
Chloride: 99 mmol/L (ref 98–110)
Creat: 0.82 mg/dL (ref 0.50–0.99)
GFR, Est African American: 87 mL/min/{1.73_m2} (ref >=60)
GFR, Est Non African American: 75 mL/min/{1.73_m2} (ref >=60)
Globulin: 2.4 g/dL (calc) (ref 1.9–3.7)
Glucose, Bld: 260 mg/dL — ABNORMAL HIGH (ref 65–99)
Potassium: 4 mmol/L (ref 3.5–5.3)
Sodium: 139 mmol/L (ref 135–146)
Total Bilirubin: 0.3 mg/dL (ref 0.2–1.2)
Total Protein: 6.7 g/dL (ref 6.1–8.1)

## 2018-12-22 LAB — FRUCTOSAMINE: Fructosamine: 333 umol/L — ABNORMAL HIGH (ref 205–285)

## 2018-12-29 ENCOUNTER — Telehealth: Payer: Self-pay

## 2018-12-29 NOTE — Telephone Encounter (Signed)
Great! Thank you, Linda!! 

## 2018-12-29 NOTE — Telephone Encounter (Signed)
Patient was asked to come in to be fitted for a CGM for 2 weeks.  I explained what this will do, and she agreed to this.  She will come in today at 12PM

## 2018-12-29 NOTE — Telephone Encounter (Signed)
[name], Can we put a professional CGM on her? I am not sure if she is dropping her sugars at night.  She has very variable blood sugars, most likely due to LADA + gastroparesis.  We had her on an insulin pump but she was too anxious to be able to manage it. Melissa, I would like to make sure she is not dropping her sugars at night before changing her regimen.  Please see above. Please let me know, C

## 2018-12-29 NOTE — Telephone Encounter (Signed)
Patient called in wanting to give Dr her readings for the last few days because she states her numbers have been very high and she's concerned.  12/26/18- Breakfast 264----- lunch 101 ---- dinner 192 bedtime 180  12/27/18-- Breakfast 301-----lunch 254----2 hours after lunch 339----- bedtime 140  Please call and advise

## 2018-12-30 ENCOUNTER — Telehealth: Payer: Self-pay | Admitting: Nutrition

## 2018-12-30 NOTE — Telephone Encounter (Signed)
Explained how this device works, and it was inserted into her left arm.  She was given a record to record insulin doses, meals and blood sugar readings for 14 days.  She was told to remove the [place] in 14 days and return it to our office in a plastic bag, with the record of her meals and insulin dose.  She agreed to do this.  She had no final questions.

## 2019-01-01 DIAGNOSIS — G629 Polyneuropathy, unspecified: Secondary | ICD-10-CM | POA: Diagnosis not present

## 2019-01-01 DIAGNOSIS — E139 Other specified diabetes mellitus without complications: Secondary | ICD-10-CM | POA: Diagnosis not present

## 2019-01-01 DIAGNOSIS — Z720 Tobacco use: Secondary | ICD-10-CM | POA: Diagnosis not present

## 2019-01-01 DIAGNOSIS — K589 Irritable bowel syndrome without diarrhea: Secondary | ICD-10-CM | POA: Diagnosis not present

## 2019-01-03 DIAGNOSIS — Z20828 Contact with and (suspected) exposure to other viral communicable diseases: Secondary | ICD-10-CM | POA: Diagnosis not present

## 2019-01-15 ENCOUNTER — Encounter: Payer: Self-pay | Admitting: Internal Medicine

## 2019-01-15 ENCOUNTER — Telehealth: Payer: Self-pay | Admitting: Internal Medicine

## 2019-01-15 ENCOUNTER — Ambulatory Visit (INDEPENDENT_AMBULATORY_CARE_PROVIDER_SITE_OTHER): Payer: Medicare Other | Admitting: Internal Medicine

## 2019-01-15 DIAGNOSIS — E139 Other specified diabetes mellitus without complications: Secondary | ICD-10-CM

## 2019-01-15 NOTE — Progress Notes (Signed)
Freestyle libre professional CGM traces between 12/31/2018 at 01/11/2019 were analyzed and the reports will be scanned.  Patient's sugars are variable but consistent patterns were noticed: Sugars are low in the second half of the night and morning, but they increase significantly after breakfast.  Around lunchtime she starts to drop her sugars and they increase but in the less significant fashion after dinner.  Patient was advised to: - reduce the dose of Lantus to 30 units for now.  - If the sugars after breakfast remain high after reducing the Lantus,  increase the dose of rapid acting insulin with breakfast by 2 units. - inject approximately 15 minutes before the meal to see if this helps reducing the sugars after breakfast. -  One of her concerns was about what to do if she is not hungry at lunch.  Reducing the Lantus will help her not drop even if she is not eating, however, if she does feel like she is dropping she may need glucose tablets, 2-4 to keep her sugars up. 

## 2019-01-16 ENCOUNTER — Telehealth: Payer: Self-pay

## 2019-01-16 ENCOUNTER — Telehealth (INDEPENDENT_AMBULATORY_CARE_PROVIDER_SITE_OTHER): Payer: Medicare Other | Admitting: Internal Medicine

## 2019-01-16 DIAGNOSIS — E139 Other specified diabetes mellitus without complications: Secondary | ICD-10-CM | POA: Diagnosis not present

## 2019-01-16 NOTE — Telephone Encounter (Signed)
error 

## 2019-01-16 NOTE — Progress Notes (Signed)
Error

## 2019-01-16 NOTE — Telephone Encounter (Signed)
-----   Message from Carlus Pavlov, MD sent at 01/15/2019  7:16 AM EST ----- [name], can you please call pt:  I looked at patient's CGM tracings and she does appear to have some significant lows.  I would suggest to reduce the dose of Lantus to 30 units for now.  The sugars after breakfast are usually high and, if they remain high after reducing the Lantus, we may need to increase the dose of rapid acting insulin with breakfast by 2 units, probably. Also, please advise her to try to inject approximately 15 minutes before the meal to see if this helps reducing the sugars after breakfast. One of her concerns was about what to do if she is not hungry at lunch.  Reducing the Lantus will help her not drop even if she is not eating, however, if she does feel like she is dropping she may need glucose tablets, 2-4 to keep her sugars up. Ty, C

## 2019-01-16 NOTE — Telephone Encounter (Signed)
Freestyle libre professional CGM traces between 12/31/2018 at 01/11/2019 were analyzed and the reports will be scanned.  Patient's sugars are variable but consistent patterns were noticed: Sugars are low in the second half of the night and morning, but they increase significantly after breakfast.  Around lunchtime she starts to drop her sugars and they increase but in the less significant fashion after dinner.  Patient was advised to: - reduce the dose of Lantus to 30 units for now.  - If the sugars after breakfast remain high after reducing the Lantus,  increase the dose of rapid acting insulin with breakfast by 2 units. - inject approximately 15 minutes before the meal to see if this helps reducing the sugars after breakfast. -  One of her concerns was about what to do if she is not hungry at lunch.  Reducing the Lantus will help her not drop even if she is not eating, however, if she does feel like she is dropping she may need glucose tablets, 2-4 to keep her sugars up.

## 2019-01-21 ENCOUNTER — Telehealth: Payer: Self-pay

## 2019-01-21 NOTE — Telephone Encounter (Signed)
Patient called in stating that her last 10 day her sugars have been over 200 5x and under 6x.     Please call and advise

## 2019-01-22 NOTE — Telephone Encounter (Signed)
Pt.notified

## 2019-01-22 NOTE — Telephone Encounter (Signed)
Spoke to patient to clarify the message.  Patient states in the last 10 days she has only had 4 lows under 60 the rest in the 200 range.  Patient is taking Lantus 34 units at night, Novolog 10,8,10  Patient said she is feeling better and keeping a food diary.

## 2019-01-22 NOTE — Telephone Encounter (Signed)
I think for now it is safer to continue with the current regimen

## 2019-01-27 ENCOUNTER — Encounter: Payer: Self-pay | Admitting: Cardiovascular Disease

## 2019-01-27 ENCOUNTER — Other Ambulatory Visit: Payer: Self-pay

## 2019-01-27 ENCOUNTER — Ambulatory Visit (INDEPENDENT_AMBULATORY_CARE_PROVIDER_SITE_OTHER): Payer: Medicare Other | Admitting: Cardiovascular Disease

## 2019-01-27 VITALS — BP 137/85 | HR 91 | Temp 96.2°F | Ht 64.0 in | Wt 165.0 lb

## 2019-01-27 DIAGNOSIS — Z794 Long term (current) use of insulin: Secondary | ICD-10-CM | POA: Diagnosis not present

## 2019-01-27 DIAGNOSIS — R55 Syncope and collapse: Secondary | ICD-10-CM | POA: Diagnosis not present

## 2019-01-27 DIAGNOSIS — E119 Type 2 diabetes mellitus without complications: Secondary | ICD-10-CM

## 2019-01-27 DIAGNOSIS — I10 Essential (primary) hypertension: Secondary | ICD-10-CM

## 2019-01-27 NOTE — Progress Notes (Signed)
SUBJECTIVE: The patient presents for follow-up of syncope.  Event monitoring did not demonstrate any arrhythmias.  She is here with her husband.  They think prior episodes of syncope were related to combination of hypotension and hypoglycemia.  She has had no further syncopal episodes.  Her heart rates today in the 90 bpm range.  She attributes this to anxiety.  Thyroid function was normal in December 2020.   Review of Systems: As per "subjective", otherwise negative.  Allergies  Allergen Reactions  . Abilify [Aripiprazole]     Patient is intolerant  . Reglan [Metoclopramide] Other (See Comments)    Chest pains    Current Outpatient Medications  Medication Sig Dispense Refill  . ALPRAZolam (XANAX) 1 MG tablet TK 1 T PO QID PRN    . AMBULATORY NON FORMULARY MEDICATION Squatty Potty x 1 1 each 0  . BD PEN NEEDLE NANO U/F 32G X 4 MM MISC USE WITH LANTUS AND NOVOLOG PENS 200 each 3  . BENICAR 40 MG tablet     . desipramine (NORPRAMIN) 25 MG tablet 3  qam 270 tablet 0  . gabapentin (NEURONTIN) 800 MG tablet Take 800 mg by mouth. 1 tablet in the morning and 2 tablets at bedtime.    Marland Kitchen glucagon (GLUCAGON EMERGENCY) 1 MG injection Inject 1 mg into the muscle once as needed. 1 each 12  . glucose blood (FREESTYLE LITE) test strip USE TO CHECK BLOOD SUGAR 6 TIMES PER DAY 600 each 2  . insulin aspart (NOVOLOG FLEXPEN) 100 UNIT/ML FlexPen Inject 10 Units into the skin daily after breakfast AND 8 Units daily before lunch AND 10 Units daily before supper. Max daily 40 units. 45 mL 3  . Insulin Glargine (LANTUS SOLOSTAR) 100 UNIT/ML Solostar Pen Inject 34 Units into the skin daily. 30 mL 3  . Insulin Syringes, Disposable, U-100 1 ML MISC To use for insulin injection.. 100 each 1  . Lancets (FREESTYLE) lancets USE TO TEST BLOOD SUGAR 4 TO 6 TIMES DAILY AS INSTRUCTED 300 each 6  . metFORMIN (GLUCOPHAGE) 500 MG tablet Take 1 tablet (500 mg total) by mouth 2 (two) times daily with a meal. 180  tablet 11  . omeprazole (PRILOSEC) 40 MG capsule Take 1 capsule (40 mg total) by mouth 2 (two) times daily. 180 capsule 3  . ondansetron (ZOFRAN-ODT) 4 MG disintegrating tablet PLACE 1 TO 2 TABLETS ON TONGUE EVERY 4 TO 6 HOURS AS NEEDED FOR NAUSEA 180 tablet 4  . OSCIMIN 0.125 MG SUBL DISSOLVE 1 TABLET UNDER THE TONGUE EVERY 4 HOURS AS NEEDED (NEED APPOINTMENT) 360 tablet 5  . Plecanatide (TRULANCE) 3 MG TABS Take 3 mg by mouth daily. 90 tablet 1  . Probiotic CAPS Take 1 capsule by mouth daily. 30 capsule 1  . traZODone (DESYREL) 100 MG tablet 1  qhs  May increse to 2  qhs  After 1 week 180 tablet 1   No current facility-administered medications for this visit.    Past Medical History:  Diagnosis Date  . Anxiety   . Bipolar 1 disorder (HCC)   . Breast density 06/25/2012   Right breast density, will get mammogram and [place]  . Depression   . Diabetes mellitus without complication (HCC)   . Duodenitis   . HTN (hypertension)   . Hyperlipidemia   . IBS (irritable bowel syndrome)     Past Surgical History:  Procedure Laterality Date  . ABDOMINAL HYSTERECTOMY    . CHOLECYSTECTOMY    .  COLONOSCOPY      Social History   Socioeconomic History  . Marital status: Married    Spouse name: phillip  . Number of children: 1  . Years of education: 14  . Highest education level: High school graduate  Occupational History  . Occupation: Disabled  Tobacco Use  . Smoking status: Never Smoker  . Smokeless tobacco: Never Used  Substance and Sexual Activity  . Alcohol use: No    Alcohol/week: 0.0 standard drinks  . Drug use: No  . Sexual activity: Not Currently  Other Topics Concern  . Not on file  Social History Narrative   Lives at home w/ her husband   Left-handed   Caffeine: 1-2 cups of coffee daily   Social Determinants of Health   Financial Resource Strain:   . Difficulty of Paying Living Expenses: Not on file  Food Insecurity:   . Worried About Programme researcher, broadcasting/film/video in the Last  Year: Not on file  . Ran Out of Food in the Last Year: Not on file  Transportation Needs:   . Lack of Transportation (Medical): Not on file  . Lack of Transportation (Non-Medical): Not on file  Physical Activity:   . Days of Exercise per Week: Not on file  . Minutes of Exercise per Session: Not on file  Stress:   . Feeling of Stress : Not on file  Social Connections:   . Frequency of Communication with Friends and Family: Not on file  . Frequency of Social Gatherings with Friends and Family: Not on file  . Attends Religious Services: Not on file  . Active Member of Clubs or Organizations: Not on file  . Attends Banker Meetings: Not on file  . Marital Status: Not on file  Intimate Partner Violence:   . Fear of Current or Ex-Partner: Not on file  . Emotionally Abused: Not on file  . Physically Abused: Not on file  . Sexually Abused: Not on file     Vitals:   01/27/19 1035  BP: 137/85  Pulse: 91  Temp: (!) 96.2 F (35.7 C)  SpO2: 98%  Height: 5\' 4"  (1.626 m)    Wt Readings from Last 3 Encounters:  12/16/18 164 lb (74.4 kg)  12/15/18 161 lb (73 kg)  11/05/18 167 lb (75.8 kg)     PHYSICAL EXAM General: NAD HEENT: Normal. Neck: No JVD, no thyromegaly. Lungs: Clear to auscultation bilaterally with normal respiratory effort. CV: Regular rate and rhythm, normal S1/S2, no S3/S4, no murmur. No pretibial or periankle edema.   Abdomen: Soft, nontender, no distention.  Neurologic: Alert and oriented.  Psych: Normal affect. Skin: Normal. Musculoskeletal: No gross deformities.      Labs: Lab Results  Component Value Date/Time   K 4.0 12/16/2018 11:34 AM   BUN 32 (H) 12/16/2018 11:34 AM   CREATININE 0.82 12/16/2018 11:34 AM   ALT 15 12/16/2018 11:34 AM   TSH 0.45 12/16/2018 11:34 AM   HGB 10.8 (L) 02/25/2018 01:59 AM     Lipids: Lab Results  Component Value Date/Time   LDLCALC 99 12/16/2018 11:34 AM   CHOL 169 12/16/2018 11:34 AM   TRIG 113.0  12/16/2018 11:34 AM   HDL 47.90 12/16/2018 11:34 AM       ASSESSMENT AND PLAN: 1.  Syncope: Blood pressure is normal. She does have insulin-dependent diabetes mellitus and may have developed an autonomic neuropathy.  She denies any antecedent symptoms to suggest ischemic heart disease.  Event monitoring was normal  demonstrating sinus rhythm and sinus tachycardia.  She has had no recurrences.  No further cardiac testing is indicated.  2.  Hypertension: Blood pressure is normal.   3.  Insulin-dependent diabetes mellitus: She has neuropathy of her legs and feet.    Disposition: Follow up as needed   Kate Sable, M.D., F.A.C.C.

## 2019-01-27 NOTE — Patient Instructions (Signed)
Medication Instructions:  Your physician recommends that you continue on your current medications as directed. Please refer to the Current Medication list given to you today.  *If you need a refill on your cardiac medications before your next appointment, please call your pharmacy*  Lab Work: None If you have labs (blood work) drawn today and your tests are completely normal, you will receive your results only by: Marland Kitchen MyChart Message (if you have MyChart) OR . A paper copy in the mail If you have any lab test that is abnormal or we need to change your treatment, we will call you to review the results.  Testing/Procedures: NONE  Follow-Up: As needed with Dr.Koneswaran     Thank you for choosing [facility] !

## 2019-02-01 DIAGNOSIS — E7849 Other hyperlipidemia: Secondary | ICD-10-CM | POA: Diagnosis not present

## 2019-02-01 DIAGNOSIS — I10 Essential (primary) hypertension: Secondary | ICD-10-CM | POA: Diagnosis not present

## 2019-02-01 DIAGNOSIS — K589 Irritable bowel syndrome without diarrhea: Secondary | ICD-10-CM | POA: Diagnosis not present

## 2019-02-01 DIAGNOSIS — E139 Other specified diabetes mellitus without complications: Secondary | ICD-10-CM | POA: Diagnosis not present

## 2019-02-03 ENCOUNTER — Telehealth: Payer: Self-pay | Admitting: Gastroenterology

## 2019-02-03 MED ORDER — TRULANCE 3 MG PO TABS: 3.0000 mg | ORAL_TABLET | Freq: Every day | ORAL | 3 refills | Status: DC

## 2019-02-03 NOTE — Telephone Encounter (Signed)
Patient calling requesting a refill for Trulance. Patient also wanted to let Dr. Lavon Paganini know that she was taking mirlax and she thinks it is working.

## 2019-02-03 NOTE — Telephone Encounter (Signed)
Refills sent to [facility]. 

## 2019-02-03 NOTE — Telephone Encounter (Signed)
Patient is calling states she needs the Tr[facility] and she is requesting that it get changed to a 90 day supply for its cheaper for her that way.

## 2019-02-03 NOTE — Addendum Note (Signed)
Addended by: Marlowe Kays on: 02/03/2019 04:22 PM   Modules accepted: Orders

## 2019-02-09 ENCOUNTER — Telehealth: Payer: Self-pay | Admitting: Gastroenterology

## 2019-02-09 ENCOUNTER — Telehealth: Payer: Self-pay | Admitting: Internal Medicine

## 2019-02-09 NOTE — Telephone Encounter (Signed)
Please advise, this seems to be a reoccurring issue every month or so and we advise her many times on how to address this.

## 2019-02-09 NOTE — Telephone Encounter (Signed)
Pt reported that she has been following Dr. Elana Alm advise and her IBS symptoms are improving.  However, pt is experiencing flu symptoms such as nausea and frequent BMs.  Pt would like to discuss med management.

## 2019-02-09 NOTE — Telephone Encounter (Signed)
Patient requests to be called at ph# [phone] re: Patient feels sick, upset stomach, bathroom issues, patient has no appetite. Patient has to force herself to eat. Patient requesting advice as to controlling her blood sugars under the above circumstances.

## 2019-02-09 NOTE — Telephone Encounter (Signed)
Spoke with the patient. She has had "2 days that I just would lie on the couch and only do what I had to do."  States she is better today. She states she never has more that 2 bowel movements in a day and wonders if she is having a flare with her IBS. She is following "to the letter" the instructions given to her at the last visit. Miralax BID, fiber and the Trulance. Discussed that 2 bowel movements in a day is okay. She is afebrile. No nausea. States she feels less anxious after talking and "knowing I am doing the right thing."

## 2019-02-10 NOTE — Telephone Encounter (Signed)
Patient calling today to get advice on the best way for a diabetic to eat when they have a stomach bug-please contact patient with advice

## 2019-02-10 NOTE — Telephone Encounter (Signed)
Notified patient of message from Dr. Gherghe, patient expressed understanding and agreement. No further questions.  

## 2019-02-10 NOTE — Telephone Encounter (Signed)
Called patient back. No answer. Left information on her voicemail. Nausea is not a side effect of Miralax. "GI bug" is usually accompanied by diarrhea. If she is having diarrhea, hold Miralax today.

## 2019-02-10 NOTE — Telephone Encounter (Signed)
Unfortunately, there are no standard guidelines.  She needs to continue to check her sugars every approximately 4 hours if she is not eating and take correction insulin if they are high or try to get in glucose tablets if they are low.

## 2019-02-10 NOTE — Telephone Encounter (Signed)
Pt called back and inquired whether "Miralax is causing abd discomfort and nausea or whether she may have a bug."

## 2019-02-10 NOTE — Telephone Encounter (Signed)
Duplicate encounter. Please see previous call with documentation from Dr. Elvera Lennox!

## 2019-02-17 DIAGNOSIS — F419 Anxiety disorder, unspecified: Secondary | ICD-10-CM | POA: Diagnosis not present

## 2019-02-17 DIAGNOSIS — E1140 Type 2 diabetes mellitus with diabetic neuropathy, unspecified: Secondary | ICD-10-CM | POA: Diagnosis not present

## 2019-02-17 DIAGNOSIS — G4700 Insomnia, unspecified: Secondary | ICD-10-CM | POA: Diagnosis not present

## 2019-02-17 DIAGNOSIS — Z6827 Body mass index (BMI) 27.0-27.9, adult: Secondary | ICD-10-CM | POA: Diagnosis not present

## 2019-02-17 DIAGNOSIS — E1142 Type 2 diabetes mellitus with diabetic polyneuropathy: Secondary | ICD-10-CM | POA: Diagnosis not present

## 2019-02-17 DIAGNOSIS — Z23 Encounter for immunization: Secondary | ICD-10-CM | POA: Diagnosis not present

## 2019-02-27 ENCOUNTER — Inpatient Hospital Stay (HOSPITAL_COMMUNITY)
Admission: EM | Admit: 2019-02-27 | Discharge: 2019-03-04 | DRG: 853 | Disposition: A | Discharge status: Home Health | Payer: Medicare Other | Attending: Internal Medicine | Admitting: Internal Medicine

## 2019-02-27 ENCOUNTER — Emergency Department (HOSPITAL_COMMUNITY): Payer: Medicare Other

## 2019-02-27 ENCOUNTER — Other Ambulatory Visit: Payer: Self-pay

## 2019-02-27 ENCOUNTER — Encounter (HOSPITAL_COMMUNITY): Payer: Self-pay | Admitting: Internal Medicine

## 2019-02-27 DIAGNOSIS — R05 Cough: Secondary | ICD-10-CM | POA: Diagnosis not present

## 2019-02-27 DIAGNOSIS — K589 Irritable bowel syndrome without diarrhea: Secondary | ICD-10-CM | POA: Diagnosis present

## 2019-02-27 DIAGNOSIS — E785 Hyperlipidemia, unspecified: Secondary | ICD-10-CM | POA: Diagnosis present

## 2019-02-27 DIAGNOSIS — N136 Pyonephrosis: Secondary | ICD-10-CM | POA: Diagnosis present

## 2019-02-27 DIAGNOSIS — N200 Calculus of kidney: Secondary | ICD-10-CM

## 2019-02-27 DIAGNOSIS — Z818 Family history of other mental and behavioral disorders: Secondary | ICD-10-CM

## 2019-02-27 DIAGNOSIS — Z87440 Personal history of urinary (tract) infections: Secondary | ICD-10-CM | POA: Diagnosis not present

## 2019-02-27 DIAGNOSIS — Z79899 Other long term (current) drug therapy: Secondary | ICD-10-CM | POA: Diagnosis not present

## 2019-02-27 DIAGNOSIS — F329 Major depressive disorder, single episode, unspecified: Secondary | ICD-10-CM | POA: Diagnosis present

## 2019-02-27 DIAGNOSIS — I959 Hypotension, unspecified: Secondary | ICD-10-CM | POA: Diagnosis not present

## 2019-02-27 DIAGNOSIS — K219 Gastro-esophageal reflux disease without esophagitis: Secondary | ICD-10-CM | POA: Diagnosis present

## 2019-02-27 DIAGNOSIS — R4182 Altered mental status, unspecified: Secondary | ICD-10-CM

## 2019-02-27 DIAGNOSIS — I10 Essential (primary) hypertension: Secondary | ICD-10-CM | POA: Diagnosis not present

## 2019-02-27 DIAGNOSIS — Z809 Family history of malignant neoplasm, unspecified: Secondary | ICD-10-CM

## 2019-02-27 DIAGNOSIS — A419 Sepsis, unspecified organism: Secondary | ICD-10-CM | POA: Diagnosis not present

## 2019-02-27 DIAGNOSIS — R410 Disorientation, unspecified: Secondary | ICD-10-CM | POA: Diagnosis not present

## 2019-02-27 DIAGNOSIS — E10649 Type 1 diabetes mellitus with hypoglycemia without coma: Secondary | ICD-10-CM | POA: Diagnosis not present

## 2019-02-27 DIAGNOSIS — Z960 Presence of urogenital implants: Secondary | ICD-10-CM | POA: Diagnosis not present

## 2019-02-27 DIAGNOSIS — N390 Urinary tract infection, site not specified: Secondary | ICD-10-CM

## 2019-02-27 DIAGNOSIS — N209 Urinary calculus, unspecified: Secondary | ICD-10-CM | POA: Diagnosis not present

## 2019-02-27 DIAGNOSIS — R059 Cough, unspecified: Secondary | ICD-10-CM

## 2019-02-27 DIAGNOSIS — F411 Generalized anxiety disorder: Secondary | ICD-10-CM | POA: Diagnosis present

## 2019-02-27 DIAGNOSIS — D649 Anemia, unspecified: Secondary | ICD-10-CM | POA: Diagnosis not present

## 2019-02-27 DIAGNOSIS — N201 Calculus of ureter: Secondary | ICD-10-CM | POA: Diagnosis present

## 2019-02-27 DIAGNOSIS — N132 Hydronephrosis with renal and ureteral calculous obstruction: Secondary | ICD-10-CM | POA: Diagnosis not present

## 2019-02-27 DIAGNOSIS — E1065 Type 1 diabetes mellitus with hyperglycemia: Secondary | ICD-10-CM | POA: Diagnosis present

## 2019-02-27 DIAGNOSIS — N289 Disorder of kidney and ureter, unspecified: Secondary | ICD-10-CM | POA: Diagnosis not present

## 2019-02-27 DIAGNOSIS — Z794 Long term (current) use of insulin: Secondary | ICD-10-CM | POA: Diagnosis not present

## 2019-02-27 DIAGNOSIS — Z20822 Contact with and (suspected) exposure to covid-19: Secondary | ICD-10-CM | POA: Diagnosis present

## 2019-02-27 DIAGNOSIS — E872 Acidosis: Secondary | ICD-10-CM | POA: Diagnosis present

## 2019-02-27 DIAGNOSIS — R509 Fever, unspecified: Secondary | ICD-10-CM | POA: Diagnosis not present

## 2019-02-27 DIAGNOSIS — E7849 Other hyperlipidemia: Secondary | ICD-10-CM | POA: Diagnosis not present

## 2019-02-27 DIAGNOSIS — R0902 Hypoxemia: Secondary | ICD-10-CM | POA: Diagnosis not present

## 2019-02-27 DIAGNOSIS — Z8349 Family history of other endocrine, nutritional and metabolic diseases: Secondary | ICD-10-CM | POA: Diagnosis not present

## 2019-02-27 DIAGNOSIS — B9620 Unspecified Escherichia coli [E. coli] as the cause of diseases classified elsewhere: Secondary | ICD-10-CM | POA: Diagnosis not present

## 2019-02-27 DIAGNOSIS — F319 Bipolar disorder, unspecified: Secondary | ICD-10-CM | POA: Diagnosis present

## 2019-02-27 DIAGNOSIS — E1042 Type 1 diabetes mellitus with diabetic polyneuropathy: Secondary | ICD-10-CM | POA: Diagnosis present

## 2019-02-27 DIAGNOSIS — G9340 Encephalopathy, unspecified: Secondary | ICD-10-CM | POA: Diagnosis present

## 2019-02-27 DIAGNOSIS — Z8249 Family history of ischemic heart disease and other diseases of the circulatory system: Secondary | ICD-10-CM

## 2019-02-27 DIAGNOSIS — E1165 Type 2 diabetes mellitus with hyperglycemia: Secondary | ICD-10-CM | POA: Diagnosis not present

## 2019-02-27 DIAGNOSIS — E119 Type 2 diabetes mellitus without complications: Secondary | ICD-10-CM | POA: Diagnosis not present

## 2019-02-27 DIAGNOSIS — E139 Other specified diabetes mellitus without complications: Secondary | ICD-10-CM | POA: Diagnosis not present

## 2019-02-27 DIAGNOSIS — N10 Acute tubulo-interstitial nephritis: Secondary | ICD-10-CM

## 2019-02-27 DIAGNOSIS — A4151 Sepsis due to Escherichia coli [E. coli]: Principal | ICD-10-CM | POA: Diagnosis present

## 2019-02-27 DIAGNOSIS — R7881 Bacteremia: Secondary | ICD-10-CM | POA: Diagnosis not present

## 2019-02-27 DIAGNOSIS — R52 Pain, unspecified: Secondary | ICD-10-CM | POA: Diagnosis not present

## 2019-02-27 DIAGNOSIS — E109 Type 1 diabetes mellitus without complications: Secondary | ICD-10-CM | POA: Diagnosis not present

## 2019-02-27 DIAGNOSIS — N138 Other obstructive and reflux uropathy: Secondary | ICD-10-CM

## 2019-02-27 DIAGNOSIS — R4781 Slurred speech: Secondary | ICD-10-CM | POA: Diagnosis present

## 2019-02-27 DIAGNOSIS — G459 Transient cerebral ischemic attack, unspecified: Secondary | ICD-10-CM | POA: Diagnosis not present

## 2019-02-27 DIAGNOSIS — N170 Acute kidney failure with tubular necrosis: Secondary | ICD-10-CM | POA: Diagnosis present

## 2019-02-27 DIAGNOSIS — D709 Neutropenia, unspecified: Secondary | ICD-10-CM | POA: Diagnosis not present

## 2019-02-27 DIAGNOSIS — Z419 Encounter for procedure for purposes other than remedying health state, unspecified: Secondary | ICD-10-CM

## 2019-02-27 DIAGNOSIS — R339 Retention of urine, unspecified: Secondary | ICD-10-CM | POA: Diagnosis not present

## 2019-02-27 DIAGNOSIS — N179 Acute kidney failure, unspecified: Secondary | ICD-10-CM | POA: Diagnosis not present

## 2019-02-27 DIAGNOSIS — Z888 Allergy status to other drugs, medicaments and biological substances status: Secondary | ICD-10-CM | POA: Diagnosis not present

## 2019-02-27 DIAGNOSIS — I700 Atherosclerosis of aorta: Secondary | ICD-10-CM | POA: Diagnosis present

## 2019-02-27 DIAGNOSIS — E876 Hypokalemia: Secondary | ICD-10-CM | POA: Diagnosis present

## 2019-02-27 DIAGNOSIS — F419 Anxiety disorder, unspecified: Secondary | ICD-10-CM | POA: Diagnosis not present

## 2019-02-27 DIAGNOSIS — E1142 Type 2 diabetes mellitus with diabetic polyneuropathy: Secondary | ICD-10-CM | POA: Diagnosis not present

## 2019-02-27 DIAGNOSIS — R5081 Fever presenting with conditions classified elsewhere: Secondary | ICD-10-CM | POA: Diagnosis not present

## 2019-02-27 DIAGNOSIS — R000 Tachycardia, unspecified: Secondary | ICD-10-CM | POA: Diagnosis not present

## 2019-02-27 HISTORY — DX: Sepsis, unspecified organism: A41.9

## 2019-02-27 LAB — COMPREHENSIVE METABOLIC PANEL
ALT: 18 U/L (ref 0–44)
AST: 30 U/L (ref 15–41)
Albumin: 4 g/dL (ref 3.5–5.0)
Alkaline Phosphatase: 112 U/L (ref 38–126)
Anion gap: 18 — ABNORMAL HIGH (ref 5–15)
BUN: 16 mg/dL (ref 8–23)
CO2: 21 mmol/L — ABNORMAL LOW (ref 22–32)
Calcium: 9.3 mg/dL (ref 8.9–10.3)
Chloride: 96 mmol/L — ABNORMAL LOW (ref 98–111)
Creatinine, Ser: 1.58 mg/dL — ABNORMAL HIGH (ref 0.44–1.00)
GFR calc Af Amer: 39 mL/min — ABNORMAL LOW (ref >60)
GFR calc non Af Amer: 34 mL/min — ABNORMAL LOW (ref >60)
Glucose, Bld: 328 mg/dL — ABNORMAL HIGH (ref 70–99)
Potassium: 3.1 mmol/L — ABNORMAL LOW (ref 3.5–5.1)
Sodium: 135 mmol/L (ref 135–145)
Total Bilirubin: 0.3 mg/dL (ref 0.3–1.2)
Total Protein: 8.1 g/dL (ref 6.5–8.1)

## 2019-02-27 LAB — URINALYSIS, ROUTINE W REFLEX MICROSCOPIC
Bilirubin Urine: NEGATIVE
Glucose, UA: 50 mg/dL — AB
Ketones, ur: NEGATIVE mg/dL
Nitrite: POSITIVE — AB
Protein, ur: 30 mg/dL — AB
Specific Gravity, Urine: 1.015 (ref 1.005–1.030)
pH: 6 (ref 5.0–8.0)

## 2019-02-27 LAB — CBC WITH DIFFERENTIAL/PLATELET
Abs Immature Granulocytes: 0.04 10*3/uL (ref 0.00–0.07)
Basophils Absolute: 0 10*3/uL (ref 0.0–0.1)
Basophils Relative: 0 %
Eosinophils Absolute: 0 10*3/uL (ref 0.0–0.5)
Eosinophils Relative: 0 %
HCT: 38.4 % (ref 36.0–46.0)
Hemoglobin: 11.8 g/dL — ABNORMAL LOW (ref 12.0–15.0)
Immature Granulocytes: 1 %
Lymphocytes Relative: 3 %
Lymphs Abs: 0.2 10*3/uL — ABNORMAL LOW (ref 0.7–4.0)
MCH: 26.2 pg (ref 26.0–34.0)
MCHC: 30.7 g/dL (ref 30.0–36.0)
MCV: 85.3 fL (ref 80.0–100.0)
Monocytes Absolute: 0.1 10*3/uL (ref 0.1–1.0)
Monocytes Relative: 1 %
Neutro Abs: 6 10*3/uL (ref 1.7–7.7)
Neutrophils Relative %: 95 %
Platelets: 197 10*3/uL (ref 150–400)
RBC: 4.5 MIL/uL (ref 3.87–5.11)
RDW: 14.5 % (ref 11.5–15.5)
WBC Morphology: INCREASED
WBC: 6.3 10*3/uL (ref 4.0–10.5)
nRBC: 0 % (ref 0.0–0.2)

## 2019-02-27 LAB — LACTIC ACID, PLASMA
Lactic Acid, Venous: 5.2 mmol/L (ref 0.5–1.9)
Lactic Acid, Venous: 5.2 mmol/L (ref 0.5–1.9)

## 2019-02-27 LAB — POC SARS CORONAVIRUS 2 AG -  ED: SARS Coronavirus 2 Ag: NEGATIVE

## 2019-02-27 LAB — PROTIME-INR
INR: 1.1 (ref 0.8–1.2)
Prothrombin Time: 14.1 seconds (ref 11.4–15.2)

## 2019-02-27 LAB — SARS CORONAVIRUS 2 (TAT 6-24 HRS): SARS Coronavirus 2: NEGATIVE

## 2019-02-27 LAB — GLUCOSE, CAPILLARY
Glucose-Capillary: 252 mg/dL — ABNORMAL HIGH (ref 70–99)
Glucose-Capillary: 262 mg/dL — ABNORMAL HIGH (ref 70–99)

## 2019-02-27 LAB — CBG MONITORING, ED: Glucose-Capillary: 324 mg/dL — ABNORMAL HIGH (ref 70–99)

## 2019-02-27 LAB — APTT: aPTT: 32 seconds (ref 24–36)

## 2019-02-27 LAB — HIV ANTIBODY (ROUTINE TESTING W REFLEX): HIV Screen 4th Generation wRfx: NONREACTIVE

## 2019-02-27 MED ORDER — HYDROMORPHONE HCL 1 MG/ML IJ SOLN
0.5000 mg | INTRAMUSCULAR | Status: DC | PRN
  Administered 2019-02-27 (×2): 0.5 mg via INTRAVENOUS
  Filled 2019-02-27 (×2): qty 0.5

## 2019-02-27 MED ORDER — HYDROMORPHONE HCL 1 MG/ML IJ SOLN: 0.5000 mg | Freq: Once | INTRAMUSCULAR | Status: DC

## 2019-02-27 MED ORDER — INSULIN GLARGINE 100 UNIT/ML ~~LOC~~ SOLN
20.0000 [IU] | Freq: Every day | SUBCUTANEOUS | Status: DC
  Administered 2019-02-27 – 2019-03-04 (×6): 20 [IU] via SUBCUTANEOUS
  Filled 2019-02-27 (×8): qty 0.2

## 2019-02-27 MED ORDER — LACTATED RINGERS IV SOLN: INTRAVENOUS | Status: AC

## 2019-02-27 MED ORDER — LACTATED RINGERS IV BOLUS (SEPSIS)
1000.0000 mL | Freq: Once | INTRAVENOUS | Status: AC
  Administered 2019-02-27: 1000 mL via INTRAVENOUS

## 2019-02-27 MED ORDER — POTASSIUM CHLORIDE CRYS ER 20 MEQ PO TBCR
40.0000 meq | EXTENDED_RELEASE_TABLET | Freq: Once | ORAL | Status: AC
  Administered 2019-02-27: 40 meq via ORAL
  Filled 2019-02-27: qty 2

## 2019-02-27 MED ORDER — VANCOMYCIN HCL IN DEXTROSE 1-5 GM/200ML-% IV SOLN: 1000.0000 mg | Freq: Once | INTRAVENOUS | Status: DC

## 2019-02-27 MED ORDER — VITAMIN D 25 MCG (1000 UNIT) PO TABS
1000.0000 [IU] | ORAL_TABLET | Freq: Every day | ORAL | Status: DC
  Administered 2019-02-28 – 2019-03-04 (×5): 1000 [IU] via ORAL
  Filled 2019-02-27 (×5): qty 1

## 2019-02-27 MED ORDER — PROMETHAZINE HCL 25 MG PO TABS: 12.5000 mg | ORAL_TABLET | Freq: Four times a day (QID) | ORAL | Status: DC | PRN

## 2019-02-27 MED ORDER — GABAPENTIN 400 MG PO CAPS
400.0000 mg | ORAL_CAPSULE | Freq: Three times a day (TID) | ORAL | Status: DC
  Administered 2019-02-27 – 2019-03-04 (×12): 400 mg via ORAL
  Filled 2019-02-27 (×12): qty 1

## 2019-02-27 MED ORDER — ACETAMINOPHEN 650 MG RE SUPP: 650.0000 mg | Freq: Four times a day (QID) | RECTAL | Status: DC | PRN

## 2019-02-27 MED ORDER — SENNOSIDES-DOCUSATE SODIUM 8.6-50 MG PO TABS: 1.0000 | ORAL_TABLET | Freq: Every evening | ORAL | Status: DC | PRN

## 2019-02-27 MED ORDER — FENTANYL CITRATE (PF) 100 MCG/2ML IJ SOLN: 12.5000 ug | Freq: Once | INTRAMUSCULAR | Status: DC

## 2019-02-27 MED ORDER — LACTATED RINGERS IV BOLUS
1000.0000 mL | Freq: Once | INTRAVENOUS | Status: AC
  Administered 2019-02-27: 1000 mL via INTRAVENOUS

## 2019-02-27 MED ORDER — PROMETHAZINE HCL 25 MG/ML IJ SOLN
12.5000 mg | Freq: Four times a day (QID) | INTRAMUSCULAR | Status: DC | PRN
  Administered 2019-03-03: 12.5 mg via INTRAVENOUS
  Filled 2019-02-27: qty 1

## 2019-02-27 MED ORDER — LACTATED RINGERS IV BOLUS (SEPSIS): 500.0000 mL | Freq: Once | INTRAVENOUS | Status: DC

## 2019-02-27 MED ORDER — SODIUM CHLORIDE 0.9 % IV SOLN: 2.0000 g | Freq: Two times a day (BID) | INTRAVENOUS | Status: DC

## 2019-02-27 MED ORDER — HEPARIN SODIUM (PORCINE) 5000 UNIT/ML IJ SOLN
5000.0000 [IU] | Freq: Three times a day (TID) | INTRAMUSCULAR | Status: DC
  Administered 2019-02-27 – 2019-03-02 (×9): 5000 [IU] via SUBCUTANEOUS
  Filled 2019-02-27 (×9): qty 1

## 2019-02-27 MED ORDER — PANTOPRAZOLE SODIUM 40 MG PO TBEC
80.0000 mg | DELAYED_RELEASE_TABLET | Freq: Every day | ORAL | Status: DC
  Administered 2019-02-28 – 2019-03-04 (×5): 80 mg via ORAL
  Filled 2019-02-27 (×5): qty 2

## 2019-02-27 MED ORDER — GABAPENTIN 800 MG PO TABS
800.0000 mg | ORAL_TABLET | Freq: Three times a day (TID) | ORAL | Status: DC
  Filled 2019-02-27 (×2): qty 1

## 2019-02-27 MED ORDER — INSULIN ASPART 100 UNIT/ML ~~LOC~~ SOLN
0.0000 [IU] | Freq: Three times a day (TID) | SUBCUTANEOUS | Status: DC
  Administered 2019-02-28 (×2): 2 [IU] via SUBCUTANEOUS
  Administered 2019-02-28: 3 [IU] via SUBCUTANEOUS
  Administered 2019-03-04: 2 [IU] via SUBCUTANEOUS

## 2019-02-27 MED ORDER — ACETAMINOPHEN 325 MG PO TABS
650.0000 mg | ORAL_TABLET | Freq: Four times a day (QID) | ORAL | Status: DC | PRN
  Administered 2019-02-28 – 2019-03-02 (×4): 650 mg via ORAL
  Filled 2019-02-27 (×4): qty 2

## 2019-02-27 MED ORDER — SODIUM CHLORIDE 0.9 % IV SOLN
2.0000 g | Freq: Once | INTRAVENOUS | Status: AC
  Administered 2019-02-27: 2 g via INTRAVENOUS
  Filled 2019-02-27: qty 2

## 2019-02-27 MED ORDER — METRONIDAZOLE IN NACL 5-0.79 MG/ML-% IV SOLN
500.0000 mg | Freq: Once | INTRAVENOUS | Status: AC
  Administered 2019-02-27: 500 mg via INTRAVENOUS
  Filled 2019-02-27: qty 100

## 2019-02-27 MED ORDER — TRAZODONE HCL 100 MG PO TABS
100.0000 mg | ORAL_TABLET | Freq: Every day | ORAL | Status: DC
  Administered 2019-02-27 – 2019-03-03 (×5): 100 mg via ORAL
  Filled 2019-02-27 (×5): qty 1

## 2019-02-27 MED ORDER — PROMETHAZINE HCL 12.5 MG RE SUPP
12.5000 mg | Freq: Four times a day (QID) | RECTAL | Status: DC | PRN
  Filled 2019-02-27: qty 1

## 2019-02-27 MED ORDER — ALPRAZOLAM 0.5 MG PO TABS
1.0000 mg | ORAL_TABLET | Freq: Four times a day (QID) | ORAL | Status: DC | PRN
  Administered 2019-02-27 – 2019-03-04 (×7): 1 mg via ORAL
  Filled 2019-02-27 (×7): qty 2

## 2019-02-27 MED ORDER — VANCOMYCIN HCL 1500 MG/300ML IV SOLN: 1500.0000 mg | INTRAVENOUS | Status: DC

## 2019-02-27 MED ORDER — ACETAMINOPHEN 325 MG PO TABS
650.0000 mg | ORAL_TABLET | Freq: Once | ORAL | Status: AC
  Administered 2019-02-27: 12:00:00 650 mg via ORAL
  Filled 2019-02-27: qty 2

## 2019-02-27 MED ORDER — VANCOMYCIN HCL 1500 MG/300ML IV SOLN
1500.0000 mg | Freq: Once | INTRAVENOUS | Status: AC
  Administered 2019-02-27: 1500 mg via INTRAVENOUS
  Filled 2019-02-27 (×2): qty 300

## 2019-02-27 NOTE — ED Provider Notes (Signed)
[facility] EMERGENCY DEPARTMENT Provider Note   CSN: [id number] Arrival date & time: 02/27/19  4709     History Chief Complaint  Patient presents with  . Altered Mental Status    [name] is a 64 y.o. female.  HPI 64 year old female presents today from home with reports of altered mental status.  Patient states that she has had some cough for the past 2 to 3 days.  She is unable to give further history and appears somewhat confused here.  Nurse received report from EMS husband called 911 to the fact that patient was altered.  They stated the patient had complained of abdominal pain due to IBS for the past several days.  I stated that when he, the husband came home, The Did not make sense.  CBG prehospital was 312 heart rate 130    Past Medical History:  Diagnosis Date  . Anxiety   . Bipolar 1 disorder (HCC)   . Breast density 06/25/2012   Right breast density, will get mammogram and [place]  . Depression   . Diabetes mellitus without complication (HCC)   . Duodenitis   . HTN (hypertension)   . Hyperlipidemia   . IBS (irritable bowel syndrome)     Patient Active Problem List   Diagnosis Date Noted  . Type 1 diabetes mellitus with hypoglycemia and without coma (HCC) 08/15/2018  . Uncontrolled type 1 diabetes mellitus with hyperglycemia (HCC) 08/15/2018  . Peripheral neuropathy 02/07/2017  . Constipation due to outlet dysfunction 05/13/2016  . Breast density 06/25/2012  . Encephalopathy, toxic 04/15/2012  . Lithium toxicity 04/15/2012  . Suicide ideation 04/15/2012  . LADA (latent autoimmune diabetes in adults), managed as type 1 (HCC)   . Depression   . IBS (irritable bowel syndrome)   . HTN (hypertension)   . Bipolar 1 disorder (HCC)   . Abnormality of gait 11/07/2011  . Muscle weakness (generalized) 11/07/2011    Past Surgical History:  Procedure Laterality Date  . ABDOMINAL HYSTERECTOMY    . CHOLECYSTECTOMY    . COLONOSCOPY       OB  History    Gravida  1   Para  1   Term      Preterm      AB      Living        SAB      TAB      Ectopic      Multiple      Live Births              Family History  Problem Relation Age of Onset  . Hypertension Mother   . Bipolar disorder Mother   . Cancer Mother   . Heart disease Brother   . Thyroid disease Sister   . Hypertension Sister   . Bipolar disorder Sister   . Depression Father   . Cancer Father   . Bipolar disorder Daughter   . Bipolar disorder Maternal Aunt   . Colon cancer Neg Hx   . Colon polyps Neg Hx   . Esophageal cancer Neg Hx   . Kidney disease Neg Hx   . Gallbladder disease Neg Hx   . Diabetes Neg Hx   . Dementia Neg Hx     Social History   Tobacco Use  . Smoking status: Never Smoker  . Smokeless tobacco: Never Used  Substance Use Topics  . Alcohol use: No    Alcohol/week: 0.0 standard drinks  . Drug use:  No    Home Medications Prior to Admission medications   Medication Sig Start Date End Date Taking? Authorizing Provider  acetaminophen (TYLENOL) 500 MG tablet Take 1,000 mg by mouth every 6 (six) hours as needed for mild pain or headache.   Yes [provider]  ALPRAZolam Prudy Feeler) 1 MG tablet Take 1 mg by mouth 4 (four) times daily as needed for anxiety.  08/10/18  Yes [provider]  amLODipine (NORVASC) 5 MG tablet Take 5 mg by mouth daily. 02/19/19  Yes [provider]  BENICAR 40 MG tablet Take 40 mg by mouth daily.  02/02/15  Yes [provider]  cholecalciferol (VITAMIN D3) 25 MCG (1000 UNIT) tablet Take 1,000 Units by mouth daily.   Yes [provider]  gabapentin (NEURONTIN) 800 MG tablet Take 800 mg by mouth 3 (three) times daily.  02/05/15  Yes [provider]  glucagon (GLUCAGON EMERGENCY) 1 MG injection Inject 1 mg into the muscle once as needed. 08/07/16  Yes Carlus Pavlov, MD  insulin aspart (NOVOLOG FLEXPEN) 100 UNIT/ML FlexPen Inject 10 Units into the skin  daily after breakfast AND 8 Units daily before lunch AND 10 Units daily before supper. Max daily 40 units. 08/15/18  Yes Shamleffer, Konrad Dolores, MD  Insulin Glargine (LANTUS SOLOSTAR) 100 UNIT/ML Solostar Pen Inject 34 Units into the skin daily. 08/15/18  Yes Shamleffer, Konrad Dolores, MD  meclizine (ANTIVERT) 25 MG tablet Take 25 mg by mouth 4 (four) times daily as needed for dizziness. 12/29/18  Yes [provider]  metFORMIN (GLUCOPHAGE) 500 MG tablet Take 1 tablet (500 mg total) by mouth 2 (two) times daily with a meal. 04/07/18  Yes Carlus Pavlov, MD  nortriptyline (PAMELOR) 50 MG capsule Take 50 mg by mouth at bedtime. 02/17/19  Yes [provider]  omeprazole (PRILOSEC) 40 MG capsule Take 1 capsule (40 mg total) by mouth 2 (two) times daily. Patient taking differently: Take 40 mg by mouth 2 (two) times daily.  01/13/18  Yes Lemmon, Violet Baldy, PA  ondansetron (ZOFRAN-ODT) 4 MG disintegrating tablet PLACE 1 TO 2 TABLETS ON TONGUE EVERY 4 TO 6 HOURS AS NEEDED FOR NAUSEA 12/15/18  Yes Nandigam, Eleonore Chiquito, MD  OSCIMIN 0.125 MG SUBL DISSOLVE 1 TABLET UNDER THE TONGUE EVERY 4 HOURS AS NEEDED (NEED APPOINTMENT) Patient taking differently: Place 1 tablet under the tongue every 4 (four) hours as needed (IBS symptoms).  08/28/18  Yes Nandigam, Eleonore Chiquito, MD  Plecanatide (TRULANCE) 3 MG TABS Take 3 mg by mouth daily. 02/03/19  Yes Napoleon Form, MD  Probiotic CAPS Take 1 capsule by mouth daily. 02/25/18  Yes Ward, Layla Maw, DO  simethicone (MYLICON) 80 MG chewable tablet Chew 80 mg by mouth every 6 (six) hours as needed for flatulence.   Yes [provider]  traZODone (DESYREL) 100 MG tablet 1  qhs  May increse to 2  qhs  After 1 week Patient taking differently: Take 100 mg by mouth at bedtime. 1  qhs  May increse to 2  qhs  After 1 week 04/10/18  Yes Plovsky, Earvin Hansen, MD  AMBULATORY NON FORMULARY MEDICATION Squatty Potty x 1 03/07/15   Napoleon Form, MD  BD PEN  NEEDLE NANO U/F 32G X 4 MM MISC USE WITH LANTUS AND NOVOLOG PENS 03/25/18   Carlus Pavlov, MD  desipramine (NORPRAMIN) 25 MG tablet 3  qam Patient not taking: Reported on 02/27/2019 05/29/18   Archer Asa, MD  glucose blood (FREESTYLE LITE) test  strip USE TO CHECK BLOOD SUGAR 6 TIMES PER DAY 07/25/18   Philemon Kingdom, MD  Insulin Syringes, Disposable, U-100 1 ML MISC To use for insulin injection.. 09/23/15   Philemon Kingdom, MD  Lancets (FREESTYLE) lancets USE TO TEST BLOOD SUGAR 4 TO 6 TIMES DAILY AS INSTRUCTED 09/05/18   Philemon Kingdom, MD    Allergies    Abilify [aripiprazole] and Reglan [metoclopramide]  Review of Systems   Review of Systems  Unable to perform ROS: Mental status change    Physical Exam Updated Vital Signs BP (!) 92/56   Pulse (!) 101   Temp 98.7 F (37.1 C) (Oral)   Resp (!) 23   Ht 1.626 m (5\' 4" )   Wt 75.3 kg   SpO2 95%   BMI 28.49 kg/m   Physical Exam Vitals and nursing note reviewed.  Constitutional:      General: She is not in acute distress.    Appearance: She is ill-appearing.     Comments: Patient temp is 102 on initial exam  HENT:     Head: Normocephalic.     Right Ear: External ear normal.     Left Ear: External ear normal.     Nose: Nose normal.     Mouth/Throat:     Mouth: Mucous membranes are dry.  Eyes:     Pupils: Pupils are equal, round, and reactive to light.  Cardiovascular:     Rate and Rhythm: Tachycardia present.     Pulses: Normal pulses.  Pulmonary:     Effort: Pulmonary effort is normal.     Breath sounds: Normal breath sounds.  Abdominal:     General: Abdomen is flat.     Palpations: Abdomen is soft.  Musculoskeletal:        General: Normal range of motion.     Cervical back: Normal range of motion.  Skin:    General: Skin is warm.     Capillary Refill: Capillary refill takes less than 2 seconds.     Comments: Diaphoretic  Neurological:     General: No focal deficit present.     Mental Status: She is  alert.  Psychiatric:        Mood and Affect: Mood normal.     ED Results / Procedures / Treatments   Labs (all labs ordered are listed, but only abnormal results are displayed) Labs Reviewed  LACTIC ACID, PLASMA - Abnormal; Notable for the following components:      Result Value   Lactic Acid, Venous 5.2 (*)    All other components within normal limits  COMPREHENSIVE METABOLIC PANEL - Abnormal; Notable for the following components:   Potassium 3.1 (*)    Chloride 96 (*)    CO2 21 (*)    Glucose, Bld 328 (*)    Creatinine, Ser 1.58 (*)    GFR calc non Af Amer 34 (*)    GFR calc Af Amer 39 (*)    Anion gap 18 (*)    All other components within normal limits  CBC WITH DIFFERENTIAL/PLATELET - Abnormal; Notable for the following components:   Hemoglobin 11.8 (*)    Lymphs Abs 0.2 (*)    All other components within normal limits  CBG MONITORING, ED - Abnormal; Notable for the following components:   Glucose-Capillary 324 (*)    All other components within normal limits  CULTURE, BLOOD (ROUTINE X 2)  CULTURE, BLOOD (ROUTINE X 2)  URINE CULTURE  SARS CORONAVIRUS 2 (TAT 6-24 HRS)  APTT  PROTIME-INR  LACTIC ACID, PLASMA  URINALYSIS, ROUTINE W REFLEX MICROSCOPIC  POC SARS CORONAVIRUS 2 AG -  ED    EKG EKG Interpretation  Date/Time:  Friday February 27 2019 09:10:07 EST Ventricular Rate:  124 PR Interval:    QRS Duration: 90 QT Interval:  320 QTC Calculation: 460 R Axis:   47 Text Interpretation: Sinus tachycardia baseline wander Confirmed by Margarita Grizzle [phone]) on 02/27/2019 10:16:00 AM   Radiology DG Chest Port 1 View  Result Date: 02/27/2019 CLINICAL DATA:  Cough and fever EXAM: PORTABLE CHEST 1 VIEW COMPARISON:  09/01/2003 FINDINGS: The heart size and mediastinal contours are within normal limits. Both lungs are clear. The visualized skeletal structures are unremarkable. IMPRESSION: No active disease. Electronically Signed   By: Marlan Palau M.D.   On: 02/27/2019  09:53    Procedures .Critical Care Performed by: Margarita Grizzle, MD Authorized by: Margarita Grizzle, MD   Critical care provider statement:    Critical care time (minutes):  45   Critical care end time:  02/27/2019 1:37 PM   Critical care was time spent personally by me on the following activities:  Discussions with consultants, evaluation of patient's response to treatment, examination of patient, ordering and performing treatments and interventions, ordering and review of laboratory studies, ordering and review of radiographic studies, pulse oximetry, re-evaluation of patient's condition, obtaining history from patient or surrogate and review of old charts   (including critical care time)  Medications Ordered in ED Medications  lactated ringers bolus 1,000 mL (1,000 mLs Intravenous New Bag/Given 02/27/19 1219)    And  lactated ringers bolus 1,000 mL (1,000 mLs Intravenous New Bag/Given 02/27/19 1219)    And  lactated ringers bolus 500 mL (has no administration in time range)  metroNIDAZOLE (FLAGYL) IVPB 500 mg (500 mg Intravenous New Bag/Given 02/27/19 1232)  vancomycin (VANCOREADY) IVPB 1500 mg/300 mL (has no administration in time range)  ceFEPIme (MAXIPIME) 2 g in sodium chloride 0.9 % 100 mL IVPB (has no administration in time range)  vancomycin (VANCOREADY) IVPB 1500 mg/300 mL (has no administration in time range)  potassium chloride SA (KLOR-CON) CR tablet 40 mEq (has no administration in time range)  acetaminophen (TYLENOL) tablet 650 mg (650 mg Oral Given 02/27/19 1219)  ceFEPIme (MAXIPIME) 2 g in sodium chloride 0.9 % 100 mL IVPB (2 g Intravenous New Bag/Given 02/27/19 1223)    ED Course  I have reviewed the triage vital signs and the nursing notes.  Pertinent labs & imaging results that were available during my care of the patient were reviewed by me and considered in my medical decision making (see chart for details).  Clinical Course as of Feb 26 1257  Fri Feb 27, 2019  1158  Cxr reviewed Cbc, cmet, lactic reviewed   [DR]    Clinical Course User Index [DR] Margarita Grizzle, MD   MDM Rules/Calculators/A&P                      1 sepsis with temp to 102, altered mental status, left shift with 95% neutrophils AKI with baseline creatinine 0.82 and creatinine today 1.58 Patient covered with broad-spectrum antibiotics with unclear etiology She has complained of some cough initial point-of-care Covid test is negative chest x-Shanele Nissan is clear No obvious sources seen on skin Awaiting urinalysis 2 hyperglycemia This will be rechecked after fluids are infusing 3 hypokalemia oral repletion insulin  Patient unassigned is followed by primary care doctor in [place].  Discussed with  internal medicine teaching service and they will see for evaluation Final Clinical Impression(s) / ED Diagnoses Final diagnoses:  Sepsis, due to unspecified organism, unspecified whether acute organ dysfunction present (HCC)  Altered mental status, unspecified altered mental status type    Rx / DC Orders ED Discharge Orders    None       Margarita Grizzle, MD 02/27/19 1338

## 2019-02-27 NOTE — ED Triage Notes (Signed)
Pt here via [facility] from home. Husband called 911 stating patient was altered. Per husband pt. C/O abd due to IBS for last few days. When he came home pt was laying on couch not making sense. CBG 312, HR 130, Pt. Last seen 0330

## 2019-02-27 NOTE — Progress Notes (Signed)
Pt. Arrived at room 321 with RN. Stable at arrival. Vital signs, assessment and orders checked and completed. RN spoke with MD on the phone. Will continuo to monitor

## 2019-02-27 NOTE — ED Notes (Signed)
PA Tresa Endo notified of elevated lactic acid.

## 2019-02-27 NOTE — H&P (Signed)
Date: 02/27/2019               Patient Name:  [name] MRN: [id number]  DOB: [birth year] Age / Sex: 64 y.o., female   PCP: Elfredia Nevins, MD         Medical Service: Internal Medicine Teaching Service         Attending Physician: Dr. Sandre Kitty, Elwin Mocha, MD    First Contact: Dr. Ronelle Nigh Pager: [phone]  Second Contact: Dr. Delma Officer Pager: [phone]       After Hours (After 5p/  First Contact Pager: [phone]  weekends / holidays): Second Contact Pager: [phone]   Chief Complaint: Confusion and back pain  History of Present Illness:  Patient is a 64 year old female with past medical history significant for hypertension, diabetes mellitus, irritable bowel syndrome, peripheral neuropathy, depression who presents with acute onset back pain at about 3:30 AM this morning followed by confusion at about 7:30 AM.  Patient reports that at 3:30 AM she developed sharp right lower back pain which comes and goes, radiates to the left side and occasionally to the lower abdomen. During episode of confusion at 7:30am, husband reports that patient would forget what she said or did just a few minutes before, was weak and had slurred speech. Denies abnormal movements, focal weakness, loss of bowel or bladder control. Husband reports that an identical situation happened back in February 2020 when patient had a urinary tract infection. He states that this is what happens when she gets sick. Patient reports that she probably took a xanax this morning but states when she takes too much of that she just gets tired. She describes subjective fevers (forehead temperature at home was normal at 99.1), sweats, and chills that started with onset of pain. She denies chest pain, shortness of breath. Reports nausea without vomiting. Denies dysuria, frequency, or urgency.  Patient denies new cough, upper respiratory tract symptoms.  Meds: *Acetaminophen 650 mg every 6 hours as needed *Xanax 1 mg every 4 hours as needed  for anxiety, reports max 2 times per day *Amlodipine 5 mg daily *Olmesartan 40 mg daily *Metformin 500 mg twice daily *Aspart 5 units + sliding scale 3 times a day with meals *Lantus 34 units daily *Omeprazole 40 mg twice daily *Zofran 4-8 mg every 4-6 hours as needed for nausea *Oscimin 0.125 mg every 4 hours as needed *Simethicone 80 mg every 6 hours as needed *Trulance 3 mg daily *Vitamin D3 1000 units daily *Gabapentin 800 mg 3 times daily *Nortriptyline 50 mg daily at bedtime *Trazodone 100 mg every evening  Allergies: Allergies as of 02/27/2019 - Review Complete 02/27/2019  Allergen Reaction Noted  . Abilify [aripiprazole]  08/10/2016  . Reglan [metoclopramide] Other (See Comments) 03/23/2013   Past Medical History:  Diagnosis Date  . Anxiety   . Bipolar 1 disorder (HCC)   . Breast density 06/25/2012   Right breast density, will get mammogram and [place]  . Depression   . Diabetes mellitus without complication (HCC)   . Duodenitis   . HTN (hypertension)   . Hyperlipidemia   . IBS (irritable bowel syndrome)    Family History:  Family History  Problem Relation Age of Onset  . Hypertension Mother   . Bipolar disorder Mother   . Cancer Mother   . Heart disease Brother   . Thyroid disease Sister   . Hypertension Sister   . Bipolar disorder Sister   . Depression Father   . Cancer Father   .  Bipolar disorder Daughter   . Bipolar disorder Maternal Aunt   . Colon cancer Neg Hx   . Colon polyps Neg Hx   . Esophageal cancer Neg Hx   . Kidney disease Neg Hx   . Gallbladder disease Neg Hx   . Diabetes Neg Hx   . Dementia Neg Hx    Social History:  * Lives in [place], PCP is Dr. Elfredia Nevins * Denies alcohol, tobacco, or recreational drug usage. Reports vaping with flavored liquids, no tobacco  Review of Systems: A complete ROS was negative except as per HPI.   Physical Exam: Blood pressure 124/63, pulse (!) 112, temperature 98.2 F (36.8 C), temperature  source Oral, resp. rate 20, height 5\' 4"  (1.626 m), weight 75.3 kg, SpO2 98 %. Physical Exam  Constitutional: She is well-developed, well-nourished, and in no distress.  HENT:  Head: Normocephalic and atraumatic.  Eyes: EOM are normal. Right eye exhibits no discharge. Left eye exhibits no discharge.  Neck: No tracheal deviation present.  Cardiovascular: Normal rate and regular rhythm. Exam reveals no gallop and no friction rub.  No murmur heard. Pulmonary/Chest: Effort normal and breath sounds normal. No respiratory distress. She has no wheezes. She has no rales.  Abdominal: Soft. She exhibits no distension. There is abdominal tenderness (mild). There is no rebound and no guarding.  Musculoskeletal:        General: No tenderness, deformity or edema. Normal range of motion.     Cervical back: Normal range of motion.  Neurological: She is alert. Coordination normal.  * Alert and oriented, answers questions appropriately, attention/concentration in tact * No cranial nerve deficit * Strength 5/5 and equal bilaterally  Skin: Skin is warm and dry. No rash noted. She is not diaphoretic. No erythema.  Psychiatric: Memory and judgment normal.   EKG: personally reviewed my interpretation is sinus tachycardia without signs of acute ischemia with QTc of 460  CXR: personally reviewed my interpretation is without acute cardiopulmonary process  CMP Latest Ref Rng & Units 02/27/2019 12/16/2018 02/25/2018  Glucose 70 - 99 mg/dL 02/27/2018) 465(K) 812(X)  BUN 8 - 23 mg/dL 16 517(G) 20  Creatinine 0.44 - 1.00 mg/dL 01(V) 4.94(W 9.67  Sodium 135 - 145 mmol/L 135 139 139  Potassium 3.5 - 5.1 mmol/L 3.1(L) 4.0 3.8  Chloride 98 - 111 mmol/L 96(L) 99 111  CO2 22 - 32 mmol/L 21(L) 31 21(L)  Calcium 8.9 - 10.3 mg/dL 9.3 9.5 5.91)  Total Protein 6.5 - 8.1 g/dL 8.1 6.7 6.8  Total Bilirubin 0.3 - 1.2 mg/dL 0.3 0.3 0.7  Alkaline Phos 38 - 126 U/L 112 - 74  AST 15 - 41 U/L 30 15 20   ALT 0 - 44 U/L 18 15 17    CBC  Latest Ref Rng & Units 02/27/2019 02/25/2018 02/24/2014  WBC 4.0 - 10.5 K/uL 6.3 13.9(H) 6.4  Hemoglobin 12.0 - 15.0 g/dL 11.8(L) 10.8(L) 13.0  Hematocrit 36.0 - 46.0 % 38.4 37.4 39.7  Platelets 150 - 400 K/uL 197 191 192.0   Lab Results  Component Value Date   LABPROT 14.1 02/27/2019   INR 1.1 02/27/2019   Lactic Acid: 5.2 -> 5.2  Urinalysis    Component Value Date/Time   COLORURINE YELLOW 02/27/2019 1656   APPEARANCEUR CLEAR 02/27/2019 1656   LABSPEC 1.015 02/27/2019 1656   PHURINE 6.0 02/27/2019 1656   GLUCOSEU 50 (A) 02/27/2019 1656   HGBUR MODERATE (A) 02/27/2019 1656   BILIRUBINUR NEGATIVE 02/27/2019 1656   KETONESUR NEGATIVE 02/27/2019 1656  PROTEINUR 30 (A) 02/27/2019 1656   UROBILINOGEN 0.2 04/15/2012 0907   NITRITE POSITIVE (A) 02/27/2019 1656   LEUKOCYTESUR SMALL (A) 02/27/2019 1656    Assessment & Plan by Problem: Active Problems:   Acute encephalopathy  Patient is a 64 year old female with past medical history significant for hypertension, diabetes mellitus, irritable bowel syndrome, peripheral neuropathy, depression who presents with acute onset back pain and confusion.  # Sepsis 2/2 Urinary Tract Infection # Acute encephalopathy # Back pain Patient presents with back pain, confusion, fever to 102.9, lactic acid of 5.2, tachycardia, initially hypotensive to low of 85/54.  Acute encephalopathy likely secondary to infection.  Patient without clear infectious symptoms but with positive leukocyte/nitrite and few bacteria. Patient has received 2.5 L LR bolus *1 L LR bolus followed by LR 125 cc/hr *Continue vancomycin+cefepime *Followup urine and blood culture *Acetaminophen 650 mg PRN for mild pain + Dilaudid 0.5 mg Q4HR PRN for severe pain  # Acute kidney injury * Creatinine 1.58, from baseline 0.82 in December 2020. May be 2/2 prerenal vs. Infection.   # Anxiety:  * Xanax 1 mg Q4HR PRN for anxiety * Trazodone 100 mg every evening  # Diabetes Mellitus:  Patient takes Metformin 500 mg twice daily + Lantus 34 units daily + aspart 5 units with sliding scale 3 times daily with meals.  Last hemoglobin A1c November 20 26.7 *Lantus 20 units daily + sliding scale insulin *Follow-up hemoglobin A1c  # Hypertension: Hypotensive on presentation, will hold home antihypertensives of amlodipine, olmesartan  # GERD: Continue home omeprazole 40 mg twice daily  # Peripheral neuropathy:  * Continue home gabapentin at reduced dose - 400 mg three times daily * Continue home nortriptyline  # IBS: Will hold home oscimin, simethicone, and trulance for now, manage symptoms   Dispo: Admit patient to Inpatient with expected length of stay greater than 2 midnights.  Signed: Jeanmarie Hubert, MD 02/27/2019, 5:15 PM

## 2019-02-27 NOTE — Progress Notes (Signed)
Pharmacy Antibiotic Note  [name] is a 64 y.o. female admitted on 02/27/2019 with AMS.  Pharmacy has been consulted for vancomycin and cefepime dosing for possible sepsis. Pt is febrile with Tmax 102.9 and WBC is WNL. SCr is above patients baseline at 1.58. Lactic acid is significantly elevated at 5.2.   Plan: Vancomycin 1500mg  IV Q48H Cefepime 2gm IV Q12H F/u renal fxn, C&S, clinical status and peak/trough at SS  Height: 5\' 4"  (162.6 cm) Weight: 166 lb (75.3 kg) IBW/kg (Calculated) : 54.7  Temp (24hrs), Avg:102.9 F (39.4 C), Min:102.9 F (39.4 C), Max:102.9 F (39.4 C)  Recent Labs  Lab 02/27/19 0920 02/27/19 0922  WBC 6.3  --   CREATININE 1.58*  --   LATICACIDVEN  --  5.2*    Estimated Creatinine Clearance: 34.8 mL/min (A) (by C-G formula based on SCr of 1.58 mg/dL (H)).    Allergies  Allergen Reactions  . Abilify [Aripiprazole]     Patient is intolerant  . Reglan [Metoclopramide] Other (See Comments)    Chest pains    Antimicrobials this admission: Vanc 2/26>> Cefepime 2/26>> Flagyl x 1 2/26  Dose adjustments this admission: N/A  Microbiology results: Pending  Thank you for allowing pharmacy to be a part of this patient's care.  Moritz Lever, 03/01/19 02/27/2019 12:11 PM

## 2019-02-27 NOTE — Progress Notes (Signed)
Inpatient Diabetes Program Recommendations  AACE/ADA: New Consensus Statement on Inpatient Glycemic Control  Target Ranges:  Prepandial:   less than 140 mg/dL      Peak postprandial:   less than 180 mg/dL (1-2 hours)      Critically ill patients:  140 - 180 mg/dL   Results for [name], [name] (MRN [id number]) as of 02/27/2019 13:11  Ref. Range 02/27/2019 09:25  Glucose-Capillary Latest Ref Range: 70 - 99 mg/dL 782 (H)  Results for [name], [name] (MRN [id number]) as of 02/27/2019 13:11  Ref. Range 02/27/2019 09:20  Glucose Latest Ref Range: 70 - 99 mg/dL 578 (H)  Results for [name], [name] (MRN [id number]) as of 02/27/2019 13:11  Ref. Range 11/05/2018 12:37  Hemoglobin A1C Latest Ref Range: 4.0 - 5.6 % 6.7 (A)   Review of Glycemic Control  Diabetes history: LADA (treated as Type 1 diabetes) Outpatient Diabetes medications: Lantus 34 units daily, Novolog 10 units with breakfast, 8 units with lunch, 10 units with supper, Metformin 500 mg BID Current orders for Inpatient glycemic control: None; in Emergency Room  Inpatient Diabetes Program Recommendations:   Insulin:  If patient is not in DKA, please consider ordering Lantus 20 units Q24H, CBGs Q4H, Novolog 0-9 units Q4H, and once diet ordered may want to consider ordering Novolog 4-5 units TID with meals if patient eats at least 50% of meals.  NOTE: Noted patient is currently in Emergency Room with altered mental status, noted to be septic (per Dr. Denny Levy note), and initial glucose 328 mg/dl at 4:13 am today. Chart reviewed. Noted patient sees Dr. Elvera Lennox for DM management. Noted progress note by Dr. Elvera Lennox on 01/15/19 which notes patient's glucose is variable, noted to be low during the night and early morning with increased glucose after breakfast. At that time patient was advised to decrease Lantus to 30 units daily and if glucose remains high after breakfast to increase Novolog by 2 units.  As long as patient is not in DKA, recommend ordering  Lantus 20 units Q24H, CBGs Q4H, Novolog 0-9 units Q4H and then once diet is ordered would recommend adding Novolog 4-5 units TID with meals for meal coverage.   Thanks, Orlando Penner, RN, MSN, CDE Diabetes Coordinator Inpatient Diabetes Program [phone] (Team Pager from 8am to 5pm)

## 2019-02-27 NOTE — ED Notes (Signed)
PATIENT FULLY UNDRESS GOWN ON .PURWICK IN PLACE SUCTION ON.1 BLANKET.CHUX UNDER PATIENT.EKG DONE.

## 2019-02-28 ENCOUNTER — Inpatient Hospital Stay (HOSPITAL_COMMUNITY): Payer: Medicare Other

## 2019-02-28 DIAGNOSIS — N390 Urinary tract infection, site not specified: Secondary | ICD-10-CM

## 2019-02-28 DIAGNOSIS — Z87440 Personal history of urinary (tract) infections: Secondary | ICD-10-CM

## 2019-02-28 DIAGNOSIS — A4151 Sepsis due to Escherichia coli [E. coli]: Principal | ICD-10-CM

## 2019-02-28 DIAGNOSIS — F329 Major depressive disorder, single episode, unspecified: Secondary | ICD-10-CM

## 2019-02-28 DIAGNOSIS — B9620 Unspecified Escherichia coli [E. coli] as the cause of diseases classified elsewhere: Secondary | ICD-10-CM

## 2019-02-28 DIAGNOSIS — N179 Acute kidney failure, unspecified: Secondary | ICD-10-CM

## 2019-02-28 DIAGNOSIS — E1142 Type 2 diabetes mellitus with diabetic polyneuropathy: Secondary | ICD-10-CM

## 2019-02-28 DIAGNOSIS — K589 Irritable bowel syndrome without diarrhea: Secondary | ICD-10-CM

## 2019-02-28 LAB — BASIC METABOLIC PANEL
Anion gap: 10 (ref 5–15)
BUN: 27 mg/dL — ABNORMAL HIGH (ref 8–23)
CO2: 24 mmol/L (ref 22–32)
Calcium: 7.7 mg/dL — ABNORMAL LOW (ref 8.9–10.3)
Chloride: 103 mmol/L (ref 98–111)
Creatinine, Ser: 1.83 mg/dL — ABNORMAL HIGH (ref 0.44–1.00)
GFR calc Af Amer: 33 mL/min — ABNORMAL LOW (ref >60)
GFR calc non Af Amer: 28 mL/min — ABNORMAL LOW (ref >60)
Glucose, Bld: 187 mg/dL — ABNORMAL HIGH (ref 70–99)
Potassium: 4 mmol/L (ref 3.5–5.1)
Sodium: 137 mmol/L (ref 135–145)

## 2019-02-28 LAB — BLOOD CULTURE ID PANEL (REFLEXED)

## 2019-02-28 LAB — LIPID PANEL
Cholesterol: 82 mg/dL (ref 0–200)
HDL: 22 mg/dL — ABNORMAL LOW (ref >40)
LDL Cholesterol: 39 mg/dL (ref 0–99)
Total CHOL/HDL Ratio: 3.7 RATIO
Triglycerides: 105 mg/dL (ref <150)
VLDL: 21 mg/dL (ref 0–40)

## 2019-02-28 LAB — CREATININE, URINE, RANDOM: Creatinine, Urine: 80.73 mg/dL

## 2019-02-28 LAB — GLUCOSE, CAPILLARY
Glucose-Capillary: 141 mg/dL — ABNORMAL HIGH (ref 70–99)
Glucose-Capillary: 151 mg/dL — ABNORMAL HIGH (ref 70–99)
Glucose-Capillary: 129 mg/dL — ABNORMAL HIGH (ref 70–99)
Glucose-Capillary: 105 mg/dL — ABNORMAL HIGH (ref 70–99)

## 2019-02-28 LAB — CBC
HCT: 27 % — ABNORMAL LOW (ref 36.0–46.0)
HCT: 29.1 % — ABNORMAL LOW (ref 36.0–46.0)
Hemoglobin: 8.6 g/dL — ABNORMAL LOW (ref 12.0–15.0)
Hemoglobin: 9.3 g/dL — ABNORMAL LOW (ref 12.0–15.0)
MCH: 26.9 pg (ref 26.0–34.0)
MCH: 26.5 pg (ref 26.0–34.0)
MCHC: 31.9 g/dL (ref 30.0–36.0)
MCHC: 32 g/dL (ref 30.0–36.0)
MCV: 84.4 fL (ref 80.0–100.0)
MCV: 82.9 fL (ref 80.0–100.0)
Platelets: 140 10*3/uL — ABNORMAL LOW (ref 150–400)
Platelets: 143 10*3/uL — ABNORMAL LOW (ref 150–400)
RBC: 3.2 MIL/uL — ABNORMAL LOW (ref 3.87–5.11)
RBC: 3.51 MIL/uL — ABNORMAL LOW (ref 3.87–5.11)
RDW: 15 % (ref 11.5–15.5)
RDW: 15.2 % (ref 11.5–15.5)
WBC: 12.5 10*3/uL — ABNORMAL HIGH (ref 4.0–10.5)
WBC: 14.4 10*3/uL — ABNORMAL HIGH (ref 4.0–10.5)
nRBC: 0 % (ref 0.0–0.2)
nRBC: 0 % (ref 0.0–0.2)

## 2019-02-28 LAB — LACTIC ACID, PLASMA: Lactic Acid, Venous: 2 mmol/L (ref 0.5–1.9)

## 2019-02-28 LAB — URINE CULTURE: Culture: NO GROWTH

## 2019-02-28 LAB — SODIUM, URINE, RANDOM: Sodium, Ur: 60 mmol/L

## 2019-02-28 LAB — HEMOGLOBIN A1C
Hgb A1c MFr Bld: 5.1 % (ref 4.8–5.6)
Mean Plasma Glucose: 100 mg/dL

## 2019-02-28 MED ORDER — SODIUM CHLORIDE 0.9 % IV SOLN
2.0000 g | Freq: Every day | INTRAVENOUS | Status: DC
  Administered 2019-02-28 – 2019-03-01 (×3): 2 g via INTRAVENOUS
  Filled 2019-02-28: qty 2
  Filled 2019-02-28 (×3): qty 20

## 2019-02-28 MED ORDER — LACTATED RINGERS IV SOLN: INTRAVENOUS | Status: AC

## 2019-02-28 NOTE — Progress Notes (Signed)
Rehab Admissions Coordinator Note:  Per PT recommendation, this patient was screened by Cheri Rous for appropriateness for an Inpatient Acute Rehab Consult.  Noted pt is already Min G on PT eval. Anticipate pt will continue to progress while in house and may not need CIR. AC will follow for additional therapy sessions to see if pt still requires an IP Rehab consult.   Cheri Rous 02/28/2019, 5:02 PM  I can be reached at [phone].

## 2019-02-28 NOTE — Evaluation (Signed)
Occupational Therapy Evaluation Patient Details Name: [name] MRN: [id number] DOB: 01/04/1955 Today's Date: 02/28/2019    History of Present Illness Patient presented to the hospital with a 5 day histroy of increasing low back pain, wekaness, and confusion. She was found to have sepsis and a UTI. PMH: HTN, DMII, depression, bi-polar, anxiety    Clinical Impression   This 64 yo female admitted with above presents to acute OT with PLOF of being totally independent with basic ADLs, IADLs, and driving. She currently is setup-S/Min guard A for all basic ADLs with decreased safety awareness. She will benefit from acute OT with follow HHOT (if 24 hour S/prn A can be provided) otherwise will need rehab.    Follow Up Recommendations  Home health OT(if has 24 hour S othewise needs rehab)    Equipment Recommendations  Tub/shower seat(pt aware she has to get one on her own)       Precautions / Restrictions Precautions Precautions: Fall Restrictions Weight Bearing Restrictions: No      Mobility Bed Mobility Overal bed mobility: Needs Assistance Bed Mobility: Supine to Sit     Supine to sit: Min assist     General bed mobility comments: min a to sit up and to scoot to the edge of the bed   Transfers Overall transfer level: Needs assistance Equipment used: Rolling walker (2 wheeled) Transfers: Sit to/from Stand Sit to Stand: Min guard         General transfer comment: min guard for safety     Balance Overall balance assessment: Needs assistance Sitting-balance support: No upper extremity supported Sitting balance-Leahy Scale: Fair     Standing balance support: Bilateral upper extremity supported Standing balance-Leahy Scale: Poor Standing balance comment: used walker for balance                            ADL either performed or assessed with clinical judgement   ADL Overall ADL's : Needs assistance/impaired Eating/Feeding: Independent;Sitting    Grooming: Min guard;Standing;Wash/dry hands   Upper Body Bathing: Set up;Supervision/ safety;Sitting   Lower Body Bathing: Min guard;Sit to/from stand   Upper Body Dressing : Set up;Supervision/safety;Sitting   Lower Body Dressing: Min guard;Sit to/from stand   Toilet Transfer: Min guard;Ambulation;Regular Toilet;Grab bars;RW   Toileting- Architect and Hygiene: Min guard;Sit to/from stand     Tub/Shower Transfer Details (indicate cue type and reason): We spoke about a shower seat for safety         Vision Patient Visual Report: No change from baseline              Pertinent Vitals/Pain Pain Assessment: No/denies pain     Hand Dominance Right   Extremity/Trunk Assessment Upper Extremity Assessment Upper Extremity Assessment: Overall WFL for tasks assessed   Lower Extremity Assessment Lower Extremity Assessment: Generalized weakness   Cervical / Trunk Assessment Cervical / Trunk Assessment: Normal   Communication Communication Communication: No difficulties   Cognition Arousal/Alertness: Awake/alert Behavior During Therapy: Impulsive Overall Cognitive Status: Impaired/Different from baseline Area of Impairment: Memory;Following commands;Safety/judgement;Awareness;Problem solving                     Memory: Decreased short-term memory Following Commands: Follows one step commands inconsistently Safety/Judgement: Decreased awareness of safety;Decreased awareness of deficits Awareness: Intellectual   General Comments: therapy went to get socks and advised the  patient we would get up when PT got back. Upon return 20 seconds later  nursing was in the room and reported the patient was about slide out of bed. Patient told nuring therapy told her to get up. Therapy advised her again not to get up on her own. Pt only up in recliner for 15 minutes when she called to get back in bed; upon entering room pt in recliner with foot rest still up and one leg  off on floor leaning over towards bed and talking into alarm box--thinking it was the call bell. Pt re-situated in recliner and asked to try and sit up for at least another 30 minutes. Pt was oriented x4              Home Living Family/patient expects to be discharged to:: Private residence Living Arrangements: Spouse/significant other Available Help at Discharge: Family;Available 24 hours/day Type of Home: House Home Access: Level entry     Home Layout: One level     Bathroom Shower/Tub: Occupational psychologist: Standard     Home Equipment: Environmental consultant - 2 wheels          Prior Functioning/Environment Level of Independence: Independent with assistive device(s)        Comments: used an walker when needed         OT Problem List: Impaired balance (sitting and/or standing);Decreased cognition      OT Treatment/Interventions: Self-care/ADL training;DME and/or AE instruction;Patient/family education;Balance training;Cognitive remediation/compensation    OT Goals(Current goals can be found in the care plan section) Acute Rehab OT Goals Patient Stated Goal: to get stronger OT Goal Formulation: With patient Time For Goal Achievement: 03/14/19 Potential to Achieve Goals: Good  OT Frequency: Min 2X/week           Co-evaluation PT/OT/SLP Co-Evaluation/Treatment: Yes(partial) Reason for Co-Treatment: Necessary to address cognition/behavior during functional activity;Complexity of the patient's impairments (multi-system involvement);For patient/therapist safety;To address functional/ADL transfers PT goals addressed during session: Balance;Mobility/safety with mobility;Proper use of DME;Strengthening/ROM OT goals addressed during session: ADL's and self-care;Proper use of Adaptive equipment and DME;Strengthening/ROM      AM-PAC OT "6 Clicks" Daily Activity     Outcome Measure Help from another person eating meals?: None Help from another person taking care of  personal grooming?: A Little Help from another person toileting, which includes using toliet, bedpan, or urinal?: A Little Help from another person bathing (including washing, rinsing, drying)?: A Little Help from another person to put on and taking off regular upper body clothing?: A Little Help from another person to put on and taking off regular lower body clothing?: A Little 6 Click Score: 19   End of Session Equipment Utilized During Treatment: Gait belt;Rolling walker  Activity Tolerance: Patient tolerated treatment well Patient left: in chair;with call bell/phone within reach;with chair alarm set  OT Visit Diagnosis: Unsteadiness on feet (R26.81);Other abnormalities of gait and mobility (R26.89);Other symptoms and signs involving cognitive function                Time: 6599-3570 OT Time Calculation (min): 23 min Charges:  OT General Charges $OT Visit: 1 Visit OT Evaluation $OT Eval Moderate Complexity: 1 Mod  Cathy  OTR/L Acute [employer] Pager [phone] Office [phone]     02/28/2019, 7:21 PM

## 2019-02-28 NOTE — Progress Notes (Addendum)
Notified on call (internal medicine, Dr. Darl Pikes) about pt's mews score.   Pt has temp of 102.6, increased pulse, and expiratory wheezing.  Gave pt incentive spirometer and educated on usage.  Pt was able to reach 750cc x 5 attempts.  Temperature was then rechecked but no change was noted.  Pt administered tylenol and RN will reevaluate in one hour.

## 2019-02-28 NOTE — Evaluation (Signed)
Physical Therapy Evaluation Patient Details Name: [name] MRN: [id number] DOB: 04-05-1955 Today's Date: 02/28/2019   History of Present Illness  Patient presented to the hospital with a 5 day histroy of increasing low back pain, wekaness, and confusion. She was found to have sepsis and a UTI. PMH: HTN, DMII, depression, bi-polar, anxiety   Clinical Impression  Patient presents with decreased strength, mobility, and safety awareness. She required min a for bed mobility and guarding for all mobility. Her husband works. She reports he plans on taking time off. She will require 24 hour assist for safety at home. If he can not provide 24 hour assist then she may benefit from CIR given the fact that she was able to function while her husband was at work up to 5 days ago. Acute therapy will continue to progress patients mobility as tolerated.      Follow Up Recommendations CIR;Home health PT    Equipment Recommendations  Rolling walker with 5" wheels    Recommendations for Other Services Rehab consult     Precautions / Restrictions Precautions Precautions: Fall Restrictions Weight Bearing Restrictions: No      Mobility  Bed Mobility Overal bed mobility: Needs Assistance Bed Mobility: Supine to Sit     Supine to sit: Min assist     General bed mobility comments: min a to sit up and to scoot to the edge of the bed   Transfers Overall transfer level: Needs assistance Equipment used: Rolling walker (2 wheeled) Transfers: Sit to/from Stand Sit to Stand: Min guard         General transfer comment: min guard for safety   Ambulation/Gait Ambulation/Gait assistance: Min guard Gait Distance (Feet): 75 Feet(also walked to the bathroom and back a distance of 10' )   Gait Pattern/deviations: Decreased step length - right;Decreased step length - left Gait velocity: decreased   General Gait Details: slow gait pattern but steady. Patient corrected posture as she was walking  because she was slouched forward. No LOB mild fatigue noted.   Stairs            Wheelchair Mobility    Modified Rankin (Stroke Patients Only)       Balance Overall balance assessment: Needs assistance Sitting-balance support: No upper extremity supported Sitting balance-Leahy Scale: Fair     Standing balance support: Bilateral upper extremity supported Standing balance-Leahy Scale: Poor Standing balance comment: used walker for balance                              Pertinent Vitals/Pain      Home Living Family/patient expects to be discharged to:: Private residence Living Arrangements: Spouse/significant other   Type of Home: House Home Access: Level entry     Home Layout: One level        Prior Function Level of Independence: Independent with assistive device(s)         Comments: used an walker when needed      Hand Dominance        Extremity/Trunk Assessment   Upper Extremity Assessment Upper Extremity Assessment: Defer to OT evaluation    Lower Extremity Assessment Lower Extremity Assessment: Generalized weakness    Cervical / Trunk Assessment Cervical / Trunk Assessment: Normal  Communication   Communication: No difficulties  Cognition Arousal/Alertness: Awake/alert Behavior During Therapy: Impulsive Overall Cognitive Status: Within Functional Limits for tasks assessed  General Comments: therapy went to get socks and advised the  patient we would get up when PT got back. Upon return 20 seconds later nursing was in the room and reported the patient was about slid out of bed. Patient told nuring therapy told her to get up. Therapy advised her again not to get up on her own       General Comments      Exercises     Assessment/Plan    PT Assessment Patient needs continued PT services  PT Problem List Decreased strength;Decreased range of motion;Decreased activity  tolerance;Decreased balance;Decreased knowledge of use of DME;Decreased mobility;Decreased safety awareness       PT Treatment Interventions DME instruction;Gait training;Functional mobility training;Therapeutic activities;Patient/family education;Balance training;Therapeutic exercise    PT Goals (Current goals can be found in the Care Plan section)  Acute Rehab PT Goals Patient Stated Goal: to get stronger PT Goal Formulation: With patient Time For Goal Achievement: 03/07/19 Potential to Achieve Goals: Good    Frequency Min 3X/week   Barriers to discharge   husband works but patient reports he is planning on taking time off     Co-evaluation PT/OT/SLP Co-Evaluation/Treatment: Yes Reason for Co-Treatment: Necessary to address cognition/behavior during functional activity;Complexity of the patient's impairments (multi-system involvement);For patient/therapist safety;To address functional/ADL transfers PT goals addressed during session: Balance;Mobility/safety with mobility;Proper use of DME;Strengthening/ROM         AM-PAC PT "6 Clicks" Mobility  Outcome Measure Help needed turning from your back to your side while in a flat bed without using bedrails?: A Little Help needed moving from lying on your back to sitting on the side of a flat bed without using bedrails?: A Little Help needed moving to and from a bed to a chair (including a wheelchair)?: A Little Help needed standing up from a chair using your arms (e.g., wheelchair or bedside chair)?: A Little Help needed to walk in hospital room?: A Little Help needed climbing 3-5 steps with a railing? : A Little 6 Click Score: 18    End of Session Equipment Utilized During Treatment: Gait belt Activity Tolerance: Patient tolerated treatment well Patient left: in chair;with call bell/phone within reach;with chair alarm set Nurse Communication: Mobility status PT Visit Diagnosis: Unsteadiness on feet (R26.81);Other abnormalities of  gait and mobility (R26.89);Pain Pain - part of body: (low back )    Time: 1510-1536 PT Time Calculation (min) (ACUTE ONLY): 26 min   Charges:   PT Evaluation $PT Eval Moderate Complexity: 1 Mod          Carney Living PT DPT  02/28/2019, 4:28 PM

## 2019-02-28 NOTE — Progress Notes (Signed)
PHARMACY - PHYSICIAN COMMUNICATION CRITICAL VALUE ALERT - BLOOD CULTURE IDENTIFICATION (BCID)  [name] is an 64 y.o. female who presented to [facility] on 02/27/2019 with a chief complaint of AMS and back pain, possible urosepsis  Assessment: 1/2 blood cultures growing E. Coli  Name of physician (or Provider) Contacted:  Dr. Cleaster Corin and Lyn Hollingshead  Current antibiotics:  Vancomycin and Cefepime   Changes to prescribed antibiotics recommended:  Change to Rocephin 2 g IV q24h  Results for orders placed or performed during the hospital encounter of 02/27/19  Blood Culture ID Panel (Reflexed) (Collected: 02/27/2019  9:20 AM)  Result Value Ref Range   Enterococcus species NOT DETECTED NOT DETECTED   Listeria monocytogenes NOT DETECTED NOT DETECTED   Staphylococcus species NOT DETECTED NOT DETECTED   Staphylococcus aureus (BCID) NOT DETECTED NOT DETECTED   Streptococcus species NOT DETECTED NOT DETECTED   Streptococcus agalactiae NOT DETECTED NOT DETECTED   Streptococcus pneumoniae NOT DETECTED NOT DETECTED   Streptococcus pyogenes NOT DETECTED NOT DETECTED   Acinetobacter baumannii NOT DETECTED NOT DETECTED   Enterobacteriaceae species DETECTED (A) NOT DETECTED   Enterobacter cloacae complex NOT DETECTED NOT DETECTED   Escherichia coli DETECTED (A) NOT DETECTED   Klebsiella oxytoca NOT DETECTED NOT DETECTED   Klebsiella pneumoniae NOT DETECTED NOT DETECTED   Proteus species NOT DETECTED NOT DETECTED   Serratia marcescens NOT DETECTED NOT DETECTED   Carbapenem resistance NOT DETECTED NOT DETECTED   Haemophilus influenzae NOT DETECTED NOT DETECTED   Neisseria meningitidis NOT DETECTED NOT DETECTED   Pseudomonas aeruginosa NOT DETECTED NOT DETECTED   Candida albicans NOT DETECTED NOT DETECTED   Candida glabrata NOT DETECTED NOT DETECTED   Candida krusei NOT DETECTED NOT DETECTED   Candida parapsilosis NOT DETECTED NOT DETECTED   Candida tropicalis NOT DETECTED NOT DETECTED     Eddie Candle 02/28/2019  1:18 AM

## 2019-02-28 NOTE — Progress Notes (Addendum)
Subjective:  Patient seen at bedside, patient states that her back pain is improved since yesterday. Patient counseled on plan of care, all questions answered.  Objective:   Vital Signs (last 24 hours): Vitals:   02/27/19 1755 02/27/19 2041 02/28/19 0004 02/28/19 0323  BP: (!) 105/51 126/63 (!) 92/56 (!) 109/51  Pulse: (!) 114 (!) 118 (!) 101 (!) 109  Resp: 16 17 16 15   Temp: 98.4 F (36.9 C) 97.6 F (36.4 C) 98.1 F (36.7 C) 97.7 F (36.5 C)  TempSrc: Oral Oral Oral Oral  SpO2: 95% 94% 92% 95%  Weight:      Height:        Physical Exam: General Alert and answers questions appropriately, no acute distress  Cardiac Regular rate and rhythm, no murmurs, rubs, or gallops  Pulmonary Clear to auscultation bilaterally without wheezes, rhonchi, or rales  Abdominal Soft, non-tender, without distention. Bowel sounds present  Extremities No peripheral edema    Blood cultures:  *E. coli found in both aerobic and anaerobic bottles from 1 set of cultures and GNR in aerobic bottle only in other set  Urine cultures are pending  CBC Latest Ref Rng & Units 02/28/2019 02/27/2019 02/25/2018  WBC 4.0 - 10.5 K/uL 12.5(H) 6.3 13.9(H)  Hemoglobin 12.0 - 15.0 g/dL 8.6(L) 11.8(L) 10.8(L)  Hematocrit 36.0 - 46.0 % 27.0(L) 38.4 37.4  Platelets 150 - 400 K/uL 140(L) 197 191   BMP Latest Ref Rng & Units 02/28/2019 02/27/2019 12/16/2018  Glucose 70 - 99 mg/dL 187(H) 328(H) 260(H)  BUN 8 - 23 mg/dL 27(H) 16 32(H)  Creatinine 0.44 - 1.00 mg/dL 1.83(H) 1.58(H) 0.82  BUN/Creat Ratio 6 - 22 (calc) - - 39(H)  Sodium 135 - 145 mmol/L 137 135 139  Potassium 3.5 - 5.1 mmol/L 4.0 3.1(L) 4.0  Chloride 98 - 111 mmol/L 103 96(L) 99  CO2 22 - 32 mmol/L 24 21(L) 31  Calcium 8.9 - 10.3 mg/dL 7.7(L) 9.3 9.5    Assessment/Plan:   Active Problems:   Acute encephalopathy  Patient is a 64 year old female past medical history significant for hypertension, diabetes mellitus, irritable bowel syndrome, peripheral  neuropathy, depression who presented with acute onset back pain and confusion and initial work-up consistent with E. coli bacteremia secondary to UTI.  # Sepsis from E. Coli Bacteremia 2/2 UTI Presentation, fever to 102.9, lactic acid 5.2, tachycardic, initially hypotensive to 85/54.  Acute encephalopathy likely secondary to infection.  Urinalysis suggestive of UTI, blood cultures growing E. coli in 1 set of cultures, other set of cultures pending.  Antibiotics were narrowed to ceftriaxone overnight.  Patient was febrile to 102.9 on presentation, afebrile since, white count today of 12.5 from 13.9 on presentation. *Continue ceftriaxone, will await sensitivities for further antibiotic narrowing, will require 7-14-day course *Follow-up urine and blood cultures *Repeat lactic acid *We will discontinue MR brain, as symptoms likely secondary to sepsis from E. coli bacteremia, not from TIA.  # Acute kidney injury:  Creatinine of 1.58 on presentation from baseline 0.82, creatinine rose to 1.83 today, likely secondary to infection. Patient received 3 L LR yesterday then placed on LR 125 cc/hr. urinalysis shows 6-10 white blood cells, no RBCs, few bacteria, no squamous epithelia. Urine studies reveal broderline prerenal-intrinsic FeNA of 1.0%, but urinary sodium of 60 is most consistent with intra-renal injury *Continue mIVF LR 125 cc/hr *Check renal ultrasound  # Diabetes Mellitus: Patient takes Metformin 500 mg twice daily + Lantus 34 units daily + aspart 5 units with sliding scale 3  times daily with meals.  Hemoglobin A1c of 5.1 on this admission, from 6.7 in November 2020 *Patient started on 20 units Lantus + sliding scale insulin  # Hypertension: Continue to hold home antihypertensives in setting of low-normal blood pressure  PT/OT: Consulted, patient reports chronic deconditioning Diet: Carb modified DVT Ppx: Heparin 5000 U Q8HRs Admit Status: Inpatient Dispo: Anticipated discharge in  approximately 2-3 days  Katherine Roan, MD 02/28/2019, 6:06 AM

## 2019-02-28 NOTE — Progress Notes (Addendum)
  Date: 02/28/2019  Patient name: [name]  Medical record number: [id number]  Date of birth: December 04, 1955   I have seen and evaluated Paula Compton and discussed their care with the Residency Team.  In brief, patient is a [age]-year-old female with past medical history of hypertension, type 2 diabetes, irritable bowel syndrome, peripheral neuropathy, depression who presented to the ED with back pain and confusion x1 day.  Patient woke up early yesterday morning with sharp right-sided lower back pain which was intermittent and radiated to the left side and to her lower abdomen.  Per chart, a few hours later patient's husband reported that patient was confused and that she forgot what she said and did a few minutes before.  She also had weakness and slurred speech at that time.  This was similar to an episode the patient had in [place] when she had a urinary tract infection.  Patient also had a low-grade temp at home of 99.1 F as well as associated sweats and chills.  No chest pain, no shortness of breath, no palpitations, no diarrhea, no nausea or vomiting, no lightheadedness, no syncope, no headache, no blurry vision, no focal weakness.  Patient was found to be febrile, tachycardic and hypotensive here and was admitted to our service for further evaluation.  Today patient states that she feels a little better but still has some intermittent back pain and generalized weakness.  PMHx, Fam Hx, and/or Soc Hx : As per resident admit note  Vitals:   02/28/19 0835 02/28/19 1136  BP:  (!) 103/56  Pulse: (!) 102 (!) 108  Resp:  18  Temp: 99.3 F (37.4 C) 98.8 F (37.1 C)  SpO2:     General: Awake, alert, oriented x3, NAD CVs: Tachycardic, normal heart sounds Lungs: CTA bilaterally Abdomen: Soft, nontender, nondistended, normoactive bowel sounds Extremities: No edema noted, nontender to palpation Psych: Normal mood and affect HEENT: Normocephalic, atraumatic Skin: Warm and  dry  Assessment and Plan: I have seen and evaluated the patient as outlined above. I agree with the formulated Assessment and Plan as detailed in the residents' note, with the following changes:   1.  Sepsis secondary to urinary tract infection: -Patient presented to ED with new onset back pain and confusion x1 day and is found to be febrile up to 102.9 F, tachycardic and hypotensive with lactic acidosis up to 5.2.  UA showed leukocytes and nitrites as well as a few bacteria.  Blood cultures were positive for E. coli and blood work showed leukocytosis up to 12 today.  -We will continue with ceftriaxone 2 g every 24 hours for now -We will follow up sensitivities -Patient had lactic acidosis up to 5.2.  We will repeat her lactic acid today -Continue with IV fluids at 125 cc/h for now -Patient also noted to have AKI with creatinine up to 1.8 today.  Patient received IV fluids in the ED as well as overnight and a creatinine worsened from 1.5-1.8.  Patient baseline creatinine is approximately 0.8.  I suspect that patient's AKI may be secondary to ATN from hypotension.  We will continue with fluids for now and monitor her urine output and creatinine.  We will also check renal sono. -If her creatinine continues to worsen would check fractional excretion of sodium -No further work-up at this time -We will continue to monitor closely  Earl Lagos, MD 2/27/202111:46 AM

## 2019-03-01 ENCOUNTER — Inpatient Hospital Stay (HOSPITAL_COMMUNITY): Payer: Medicare Other

## 2019-03-01 DIAGNOSIS — E139 Other specified diabetes mellitus without complications: Secondary | ICD-10-CM | POA: Diagnosis not present

## 2019-03-01 DIAGNOSIS — R7881 Bacteremia: Secondary | ICD-10-CM

## 2019-03-01 DIAGNOSIS — K589 Irritable bowel syndrome without diarrhea: Secondary | ICD-10-CM | POA: Diagnosis not present

## 2019-03-01 DIAGNOSIS — E7849 Other hyperlipidemia: Secondary | ICD-10-CM | POA: Diagnosis not present

## 2019-03-01 DIAGNOSIS — I10 Essential (primary) hypertension: Secondary | ICD-10-CM | POA: Diagnosis not present

## 2019-03-01 LAB — CBC
HCT: 27.7 % — ABNORMAL LOW (ref 36.0–46.0)
Hemoglobin: 8.8 g/dL — ABNORMAL LOW (ref 12.0–15.0)
MCH: 26.8 pg (ref 26.0–34.0)
MCHC: 31.8 g/dL (ref 30.0–36.0)
MCV: 84.5 fL (ref 80.0–100.0)
Platelets: 151 10*3/uL (ref 150–400)
RBC: 3.28 MIL/uL — ABNORMAL LOW (ref 3.87–5.11)
RDW: 15.3 % (ref 11.5–15.5)
WBC: 10 10*3/uL (ref 4.0–10.5)
nRBC: 0 % (ref 0.0–0.2)

## 2019-03-01 LAB — BASIC METABOLIC PANEL
Anion gap: 9 (ref 5–15)
BUN: 25 mg/dL — ABNORMAL HIGH (ref 8–23)
CO2: 24 mmol/L (ref 22–32)
Calcium: 7.8 mg/dL — ABNORMAL LOW (ref 8.9–10.3)
Chloride: 108 mmol/L (ref 98–111)
Creatinine, Ser: 1.36 mg/dL — ABNORMAL HIGH (ref 0.44–1.00)
GFR calc Af Amer: 47 mL/min — ABNORMAL LOW (ref >60)
GFR calc non Af Amer: 40 mL/min — ABNORMAL LOW (ref >60)
Glucose, Bld: 62 mg/dL — ABNORMAL LOW (ref 70–99)
Potassium: 3.6 mmol/L (ref 3.5–5.1)
Sodium: 141 mmol/L (ref 135–145)

## 2019-03-01 LAB — GLUCOSE, CAPILLARY
Glucose-Capillary: 63 mg/dL — ABNORMAL LOW (ref 70–99)
Glucose-Capillary: 122 mg/dL — ABNORMAL HIGH (ref 70–99)
Glucose-Capillary: 60 mg/dL — ABNORMAL LOW (ref 70–99)
Glucose-Capillary: 169 mg/dL — ABNORMAL HIGH (ref 70–99)
Glucose-Capillary: 91 mg/dL (ref 70–99)
Glucose-Capillary: 77 mg/dL (ref 70–99)
Glucose-Capillary: 181 mg/dL — ABNORMAL HIGH (ref 70–99)

## 2019-03-01 LAB — CULTURE, BLOOD (ROUTINE X 2): Special Requests: ADEQUATE

## 2019-03-01 MED ORDER — DEXTROSE 50 % IV SOLN
INTRAVENOUS | Status: AC
  Filled 2019-03-01: qty 50

## 2019-03-01 MED ORDER — LACTATED RINGERS IV SOLN: INTRAVENOUS | Status: AC

## 2019-03-01 NOTE — Progress Notes (Addendum)
Hypoglycemic Event  CBG: 63  Treatment: 8 oz of orange juice given; pt cbg rechecked 60. Gave 38ml of dextrose iv; recheck  Symptoms: patient states, "I dont feel good I think my sugar is low" Follow-up CBG: Time: 7:00 CBG Result: 122  Possible Reasons for Event: pt has poor appetite lately Comments/MD notified:on-call notified.    Cailen Texeira L

## 2019-03-01 NOTE — Progress Notes (Addendum)
   Subjective: HD#2   Overnight: Febrile to 102F, Tachycardiac to 109. Received Tylenol   Today, [name] states that she cannot walk or sit up.  She also reports of a new cough.  She reiterated her episode of fever yesterday which improved with Tylenol.  She has been using her incentive spirometer as much as she can  Objective:  Vital signs in last 24 hours: Vitals:   02/28/19 2157 02/28/19 2350 03/01/19 0335 03/01/19 0422  BP:  135/71 127/62   Pulse:  (!) 110 (!) 101   Resp:  14 19   Temp: (!) 102.6 F (39.2 C) 100 F (37.8 C) 98.7 F (37.1 C)   TempSrc: Oral Oral Oral   SpO2:  93% 95%   Weight:    75.9 kg  Height:       Const: In no apparent distress Resp: Diffuse rhonchi, no evidence of crackles CV: Tachycardic, no murmurs, gallop, rub Abd: Bowel sounds present, nondistended, nontender to palpation   Assessment/Plan:  Active Problems:   Acute encephalopathy  Patient is a 64 year old female past medical history significant for hypertension, diabetes mellitus, irritable bowel syndrome, peripheral neuropathy, depression who presented with acute onset back pain and confusion and initial work-up consistent with E. coli bacteremia secondary to UTI.  # Sepsis from E. Coli Bacteremia 2/2 UTI Presentation, fever to 102.9, lactic acid 5.2, tachycardic, initially hypotensive to 85/54.  Acute encephalopathy likely secondary to infection.  Urinalysis suggestive of UTI, blood cultures growing E. coli in 1 set of cultures, other set of cultures pending.  Antibiotics were narrowed to ceftriaxone.  On the night of 02/28/2019, she had several febrile episodes to 102 F and received Tylenol.  Blood cultures sensitive to cephalosporin.  If she continues to spike fever, would consider CT abdomen pelvis to investigate for pyelonephritis *s/p Cefepime, Vanc, Flagyl x1 dose *Continue ceftriaxone (Day 2 of 14)   # Acute kidney injury, likely pre-renal azotemia:  Creatinine of 1.58 on  presentation from baseline 0.8.  Urine studies reveal broderline prerenal-intrinsic FeNA of 1.0%, but urinary sodium of 60 is most consistent with intra-renal injury.  Renal ultrasound without evidence of hydronephrosis *s/p 3 L LR and  LR 125 cc/hr. *Continue LR @75mL /hr *Continue to monitor   #Cough: She complains of new cough without much evidence of sputum production.  Given that she did have several episodes of fever overnight, I am concerned about a pulmonary insult and will follow up on chest x-ray. *Follow-up chest x-ray *Advised on incentive spirometer   # Diabetes Mellitus: Patient takes Metformin 500 mg twice daily + Lantus 34 units daily + aspart 5 units with sliding scale 3 times daily with meals.  Hemoglobin A1c of 5.1 on this admission, from 6.7 in November 2020 *Continue  20 units Lantus + sliding scale insulin   # Hypertension: Continue to hold home antihypertensives in setting of low-normal blood pressure  PT/OT: Consulted, patient reports chronic deconditioning Diet: Carb modified DVT Ppx: Heparin 5000 U Q8HRs Admit Status: Inpatient Dispo: Anticipated discharge in approximately 2-3 days   December 2020, MD 03/01/2019, 6:24 AM Pager: [phone] Internal Medicine Teaching Service

## 2019-03-01 NOTE — Progress Notes (Signed)
Was given Xanax 1mg  po for anxiety, as she was starting to get snappy and argumentative and threatening to leave.  All interventions to calm her, including calling husband to help calm her, failed and her husband said she definitely needs xanax, as she gets this way at home and it takes several hours to get her calmed back down.  it even gets worse than that.  She is calmer at this time, after receiving xanax.

## 2019-03-02 ENCOUNTER — Inpatient Hospital Stay (HOSPITAL_COMMUNITY): Payer: Medicare Other

## 2019-03-02 ENCOUNTER — Inpatient Hospital Stay (HOSPITAL_COMMUNITY): Payer: Medicare Other | Admitting: Certified Registered Nurse Anesthetist

## 2019-03-02 ENCOUNTER — Other Ambulatory Visit: Payer: Self-pay | Admitting: Internal Medicine

## 2019-03-02 ENCOUNTER — Encounter (HOSPITAL_COMMUNITY)
Admission: EM | Disposition: A | Discharge status: Home Health | Payer: Self-pay | Source: Home / Self Care | Attending: Internal Medicine

## 2019-03-02 ENCOUNTER — Encounter (HOSPITAL_COMMUNITY): Payer: Self-pay | Admitting: Internal Medicine

## 2019-03-02 DIAGNOSIS — A419 Sepsis, unspecified organism: Secondary | ICD-10-CM

## 2019-03-02 DIAGNOSIS — I959 Hypotension, unspecified: Secondary | ICD-10-CM

## 2019-03-02 DIAGNOSIS — N200 Calculus of kidney: Secondary | ICD-10-CM

## 2019-03-02 DIAGNOSIS — D709 Neutropenia, unspecified: Secondary | ICD-10-CM

## 2019-03-02 DIAGNOSIS — E119 Type 2 diabetes mellitus without complications: Secondary | ICD-10-CM

## 2019-03-02 DIAGNOSIS — N138 Other obstructive and reflux uropathy: Secondary | ICD-10-CM

## 2019-03-02 DIAGNOSIS — Z794 Long term (current) use of insulin: Secondary | ICD-10-CM

## 2019-03-02 DIAGNOSIS — R7881 Bacteremia: Secondary | ICD-10-CM

## 2019-03-02 DIAGNOSIS — Z79899 Other long term (current) drug therapy: Secondary | ICD-10-CM

## 2019-03-02 DIAGNOSIS — R000 Tachycardia, unspecified: Secondary | ICD-10-CM

## 2019-03-02 DIAGNOSIS — N10 Acute tubulo-interstitial nephritis: Secondary | ICD-10-CM

## 2019-03-02 DIAGNOSIS — I10 Essential (primary) hypertension: Secondary | ICD-10-CM

## 2019-03-02 DIAGNOSIS — R5081 Fever presenting with conditions classified elsewhere: Secondary | ICD-10-CM

## 2019-03-02 HISTORY — PX: CYSTOSCOPY WITH STENT PLACEMENT: SHX5790

## 2019-03-02 LAB — GLUCOSE, CAPILLARY
Glucose-Capillary: 103 mg/dL — ABNORMAL HIGH (ref 70–99)
Glucose-Capillary: 106 mg/dL — ABNORMAL HIGH (ref 70–99)
Glucose-Capillary: 116 mg/dL — ABNORMAL HIGH (ref 70–99)
Glucose-Capillary: 121 mg/dL — ABNORMAL HIGH (ref 70–99)

## 2019-03-02 LAB — BASIC METABOLIC PANEL
Anion gap: 11 (ref 5–15)
BUN: 17 mg/dL (ref 8–23)
CO2: 25 mmol/L (ref 22–32)
Calcium: 8.4 mg/dL — ABNORMAL LOW (ref 8.9–10.3)
Chloride: 104 mmol/L (ref 98–111)
Creatinine, Ser: 1.08 mg/dL — ABNORMAL HIGH (ref 0.44–1.00)
GFR calc Af Amer: >60 mL/min (ref >60)
GFR calc non Af Amer: 53 mL/min — ABNORMAL LOW (ref >60)
Glucose, Bld: 94 mg/dL (ref 70–99)
Potassium: 3.9 mmol/L (ref 3.5–5.1)
Sodium: 140 mmol/L (ref 135–145)

## 2019-03-02 LAB — CULTURE, BLOOD (ROUTINE X 2): Special Requests: ADEQUATE

## 2019-03-02 LAB — LACTIC ACID, PLASMA
Lactic Acid, Venous: 3.3 mmol/L (ref 0.5–1.9)
Lactic Acid, Venous: 2.3 mmol/L (ref 0.5–1.9)

## 2019-03-02 LAB — CBC WITH DIFFERENTIAL/PLATELET
Abs Immature Granulocytes: 0.03 10*3/uL (ref 0.00–0.07)
Basophils Absolute: 0 10*3/uL (ref 0.0–0.1)
Basophils Relative: 0 %
Eosinophils Absolute: 0 10*3/uL (ref 0.0–0.5)
Eosinophils Relative: 0 %
HCT: 24.1 % — ABNORMAL LOW (ref 36.0–46.0)
Hemoglobin: 7.6 g/dL — ABNORMAL LOW (ref 12.0–15.0)
Immature Granulocytes: 0 %
Lymphocytes Relative: 3 %
Lymphs Abs: 0.3 10*3/uL — ABNORMAL LOW (ref 0.7–4.0)
MCH: 26.7 pg (ref 26.0–34.0)
MCHC: 31.5 g/dL (ref 30.0–36.0)
MCV: 84.6 fL (ref 80.0–100.0)
Monocytes Absolute: 0.7 10*3/uL (ref 0.1–1.0)
Monocytes Relative: 7 %
Neutro Abs: 8.2 10*3/uL — ABNORMAL HIGH (ref 1.7–7.7)
Neutrophils Relative %: 90 %
Platelets: 95 10*3/uL — ABNORMAL LOW (ref 150–400)
RBC: 2.85 MIL/uL — ABNORMAL LOW (ref 3.87–5.11)
RDW: 15.4 % (ref 11.5–15.5)
WBC: 9.2 10*3/uL (ref 4.0–10.5)
nRBC: 0 % (ref 0.0–0.2)

## 2019-03-02 LAB — TROPONIN I (HIGH SENSITIVITY)
Troponin I (High Sensitivity): 22 ng/L — ABNORMAL HIGH (ref <18)
Troponin I (High Sensitivity): 57 ng/L — ABNORMAL HIGH (ref <18)
Troponin I (High Sensitivity): 29 ng/L — ABNORMAL HIGH (ref <18)

## 2019-03-02 SURGERY — CYSTOSCOPY, WITH STENT INSERTION
Anesthesia: General | Site: Urethra | Laterality: Right

## 2019-03-02 MED ORDER — ONDANSETRON HCL 4 MG/2ML IJ SOLN
INTRAMUSCULAR | Status: DC | PRN
  Administered 2019-03-02: 4 mg via INTRAVENOUS

## 2019-03-02 MED ORDER — LACTATED RINGERS IV SOLN: INTRAVENOUS | Status: DC | PRN

## 2019-03-02 MED ORDER — ALBUMIN HUMAN 5 % IV SOLN
INTRAVENOUS | Status: AC
  Filled 2019-03-02: qty 250

## 2019-03-02 MED ORDER — LIDOCAINE 2% (20 MG/ML) 5 ML SYRINGE
INTRAMUSCULAR | Status: DC | PRN
  Administered 2019-03-02: 80 mg via INTRAVENOUS

## 2019-03-02 MED ORDER — ENOXAPARIN SODIUM 40 MG/0.4ML ~~LOC~~ SOLN
40.0000 mg | SUBCUTANEOUS | Status: DC
  Administered 2019-03-03: 40 mg via SUBCUTANEOUS
  Filled 2019-03-02: qty 0.4

## 2019-03-02 MED ORDER — SODIUM CHLORIDE 0.9 % IV SOLN
0.0000 ug/min | INTRAVENOUS | Status: DC
  Filled 2019-03-02: qty 1

## 2019-03-02 MED ORDER — LIDOCAINE 2% (20 MG/ML) 5 ML SYRINGE
INTRAMUSCULAR | Status: AC
  Filled 2019-03-02: qty 5

## 2019-03-02 MED ORDER — FENTANYL CITRATE (PF) 250 MCG/5ML IJ SOLN
INTRAMUSCULAR | Status: AC
  Filled 2019-03-02: qty 5

## 2019-03-02 MED ORDER — SODIUM CHLORIDE 0.9 % IV SOLN
2.0000 g | Freq: Three times a day (TID) | INTRAVENOUS | Status: DC
  Administered 2019-03-02: 2 g via INTRAVENOUS
  Filled 2019-03-02: qty 2

## 2019-03-02 MED ORDER — MIDAZOLAM HCL 2 MG/2ML IJ SOLN
INTRAMUSCULAR | Status: AC
  Filled 2019-03-02: qty 2

## 2019-03-02 MED ORDER — 0.9 % SODIUM CHLORIDE (POUR BTL) OPTIME
TOPICAL | Status: DC | PRN
  Administered 2019-03-02: 1000 mL

## 2019-03-02 MED ORDER — SUGAMMADEX SODIUM 200 MG/2ML IV SOLN
INTRAVENOUS | Status: DC | PRN
  Administered 2019-03-02: 150 mg via INTRAVENOUS

## 2019-03-02 MED ORDER — LACTATED RINGERS IV BOLUS
500.0000 mL | Freq: Once | INTRAVENOUS | Status: AC
  Administered 2019-03-02: 500 mL via INTRAVENOUS

## 2019-03-02 MED ORDER — PROPOFOL 10 MG/ML IV BOLUS
INTRAVENOUS | Status: AC
  Filled 2019-03-02: qty 20

## 2019-03-02 MED ORDER — IOHEXOL 300 MG/ML  SOLN
100.0000 mL | Freq: Once | INTRAMUSCULAR | Status: AC | PRN
  Administered 2019-03-02: 100 mL via INTRAVENOUS

## 2019-03-02 MED ORDER — CEFAZOLIN SODIUM-DEXTROSE 2-4 GM/100ML-% IV SOLN
2.0000 g | Freq: Three times a day (TID) | INTRAVENOUS | Status: DC
  Administered 2019-03-02 – 2019-03-04 (×5): 2 g via INTRAVENOUS
  Filled 2019-03-02 (×8): qty 100

## 2019-03-02 MED ORDER — ROCURONIUM BROMIDE 100 MG/10ML IV SOLN
INTRAVENOUS | Status: DC | PRN
  Administered 2019-03-02: 30 mg via INTRAVENOUS

## 2019-03-02 MED ORDER — VASOPRESSIN 20 UNIT/ML IV SOLN
INTRAVENOUS | Status: AC
  Filled 2019-03-02: qty 1

## 2019-03-02 MED ORDER — FENTANYL CITRATE (PF) 250 MCG/5ML IJ SOLN
INTRAMUSCULAR | Status: DC | PRN
  Administered 2019-03-02: 50 ug via INTRAVENOUS

## 2019-03-02 MED ORDER — SODIUM CHLORIDE 0.9 % IV SOLN: INTRAVENOUS | Status: AC

## 2019-03-02 MED ORDER — ACETAMINOPHEN 10 MG/ML IV SOLN
1000.0000 mg | Freq: Once | INTRAVENOUS | Status: AC
  Administered 2019-03-02: 1000 mg via INTRAVENOUS

## 2019-03-02 MED ORDER — PROPOFOL 10 MG/ML IV BOLUS
INTRAVENOUS | Status: DC | PRN
  Administered 2019-03-02: 50 mg via INTRAVENOUS
  Administered 2019-03-02: 100 mg via INTRAVENOUS

## 2019-03-02 MED ORDER — ALBUMIN HUMAN 5 % IV SOLN: INTRAVENOUS | Status: DC | PRN

## 2019-03-02 MED ORDER — ACETAMINOPHEN 10 MG/ML IV SOLN
INTRAVENOUS | Status: AC
  Filled 2019-03-02: qty 100

## 2019-03-02 MED ORDER — ALBUMIN HUMAN 5 % IV SOLN
12.5000 g | Freq: Once | INTRAVENOUS | Status: AC
  Administered 2019-03-02: 12.5 g via INTRAVENOUS

## 2019-03-02 MED ORDER — SUCCINYLCHOLINE CHLORIDE 20 MG/ML IJ SOLN
INTRAMUSCULAR | Status: DC | PRN
  Administered 2019-03-02: 100 mg via INTRAVENOUS

## 2019-03-02 MED ORDER — LACTATED RINGERS IV SOLN: INTRAVENOUS | Status: DC

## 2019-03-02 MED ORDER — PHENYLEPHRINE 40 MCG/ML (10ML) SYRINGE FOR IV PUSH (FOR BLOOD PRESSURE SUPPORT)
PREFILLED_SYRINGE | INTRAVENOUS | Status: DC | PRN
  Administered 2019-03-02 (×2): 80 ug via INTRAVENOUS
  Administered 2019-03-02: 160 ug via INTRAVENOUS
  Administered 2019-03-02: 120 ug via INTRAVENOUS
  Administered 2019-03-02: 80 ug via INTRAVENOUS
  Administered 2019-03-02 (×2): 120 ug via INTRAVENOUS

## 2019-03-02 MED ORDER — PHENYLEPHRINE HCL-NACL 10-0.9 MG/250ML-% IV SOLN
0.0000 ug/min | INTRAVENOUS | Status: DC
  Administered 2019-03-02: 20 ug/min via INTRAVENOUS
  Filled 2019-03-02 (×3): qty 250

## 2019-03-02 MED ORDER — MIDAZOLAM HCL 5 MG/5ML IJ SOLN
INTRAMUSCULAR | Status: DC | PRN
  Administered 2019-03-02: 2 mg via INTRAVENOUS

## 2019-03-02 MED ORDER — IOHEXOL 300 MG/ML  SOLN
INTRAMUSCULAR | Status: DC | PRN
  Administered 2019-03-02: 50 mL via URETHRAL

## 2019-03-02 MED ORDER — VASOPRESSIN 20 UNIT/ML IV SOLN
INTRAVENOUS | Status: DC | PRN
  Administered 2019-03-02 (×2): 1 [IU] via INTRAVENOUS
  Administered 2019-03-02: 2 [IU] via INTRAVENOUS

## 2019-03-02 MED ORDER — PHENYLEPHRINE HCL-NACL 10-0.9 MG/250ML-% IV SOLN
INTRAVENOUS | Status: DC | PRN
  Administered 2019-03-02: 80 ug/min via INTRAVENOUS

## 2019-03-02 MED ORDER — SUCCINYLCHOLINE CHLORIDE 200 MG/10ML IV SOSY
PREFILLED_SYRINGE | INTRAVENOUS | Status: AC
  Filled 2019-03-02: qty 10

## 2019-03-02 MED ORDER — ALBUTEROL SULFATE HFA 108 (90 BASE) MCG/ACT IN AERS
INHALATION_SPRAY | RESPIRATORY_TRACT | Status: DC | PRN
  Administered 2019-03-02 (×2): 2 via RESPIRATORY_TRACT

## 2019-03-02 MED ORDER — NALOXONE HCL 0.4 MG/ML IJ SOLN
INTRAMUSCULAR | Status: DC | PRN
  Administered 2019-03-02 (×3): 80 ug via INTRAVENOUS

## 2019-03-02 SURGICAL SUPPLY — 31 items
BAG URINE DRAIN 2000ML AR STRL (UROLOGICAL SUPPLIES) ×3 IMPLANT
BAG URO CATCHER STRL LF (MISCELLANEOUS) IMPLANT
CATH FOLEY 2WAY SLVR  5CC 16FR (CATHETERS)
CATH FOLEY 2WAY SLVR 5CC 16FR (CATHETERS) IMPLANT
CATH INTERMIT  6FR 70CM (CATHETERS) IMPLANT
CATH URET 5FR 28IN OPEN ENDED (CATHETERS) ×3 IMPLANT
DRAPE C-ARM 42X72 X-RAY (DRAPES) ×3 IMPLANT
GLOVE BIO SURGEON STRL SZ 6.5 (GLOVE) ×2 IMPLANT
GLOVE BIO SURGEONS STRL SZ 6.5 (GLOVE) ×1
GOWN STRL REUS W/ TWL LRG LVL3 (GOWN DISPOSABLE) ×2 IMPLANT
GOWN STRL REUS W/TWL LRG LVL3 (GOWN DISPOSABLE) ×4
GUIDEWIRE ANG ZIPWIRE 038X150 (WIRE) IMPLANT
GUIDEWIRE STR DUAL SENSOR (WIRE) IMPLANT
KIT TURNOVER KIT B (KITS) ×3 IMPLANT
MANIFOLD NEPTUNE II (INSTRUMENTS) ×3 IMPLANT
NS IRRIG 1000ML POUR BTL (IV SOLUTION) ×3 IMPLANT
PACK CYSTO (CUSTOM PROCEDURE TRAY) ×3 IMPLANT
PAD ARMBOARD 7.5X6 YLW CONV (MISCELLANEOUS) ×3 IMPLANT
SENSORWIRE 0.038 NOT ANGLED (WIRE) ×3
STENT URET 6FRX24 CONTOUR (STENTS) ×3 IMPLANT
STENT URET 6FRX26 CONTOUR (STENTS) IMPLANT
SYPHON OMNI JUG (MISCELLANEOUS) ×3 IMPLANT
SYR TOOMEY IRRIG 70ML (MISCELLANEOUS)
SYRINGE TOOMEY IRRIG 70ML (MISCELLANEOUS) IMPLANT
TOWEL GREEN STERILE FF (TOWEL DISPOSABLE) ×3 IMPLANT
TRAY FOLEY W/BAG SLVR 14FR (SET/KITS/TRAYS/PACK) ×3 IMPLANT
TUBE CONNECTING 20'X1/4 (TUBING) ×1
TUBE CONNECTING 20X1/4 (TUBING) ×2 IMPLANT
UNDERPAD 30X36 HEAVY ABSORB (UNDERPADS AND DIAPERS) ×3 IMPLANT
WATER STERILE IRR 1000ML POUR (IV SOLUTION) ×3 IMPLANT
WIRE SENSOR 0.038 NOT ANGLED (WIRE) ×1 IMPLANT

## 2019-03-02 NOTE — Progress Notes (Signed)
Was called by tele because HR up and sustained in the 130s.  Upon entering room found pt shivering and saying "something ain't right".  She was able to talk and follow commands.  Lung sounds coarse.  Tylenol given and ice packs applied.  MD paged and RRT called.

## 2019-03-02 NOTE — Progress Notes (Signed)
Subjective: HD#3 Events Overnight: febrile 100.89F, Tachycardic to 106, Hypertensive overnight 166/81  Patient was seen this morning on rounds.  Patient states that [name] is not feeling well.  [name] is not exactly sure what is wrong but [name] states that [name] feels really bad.  [name] states that [name] does have some chest pain.  Objective:  Vital signs in last 24 hours: Vitals:   03/01/19 1653 03/01/19 1950 03/01/19 2342 03/02/19 0401  BP:  (!) 160/81 133/81 122/70  Pulse:  98 (!) 106 94  Resp:  16 16 16   Temp: 99.5 F (37.5 C) 100.1 F (37.8 C) (!) 100.4 F (38 C) 98.6 F (37 C)  TempSrc: Oral Oral Oral Oral  SpO2:  95% 95% 96%  Weight:      Height:        Physical Exam: Physical Exam  Constitutional: [name] is oriented to person, place, and time. [name] appears distressed.  HENT:  Head: Normocephalic and atraumatic.  Eyes: EOM are normal.  Cardiovascular: Intact distal pulses. Tachycardia present.  Pulmonary/Chest: Tachypnea noted. [name] is in respiratory distress. [name] exhibits no tenderness.  Abdominal: [name] exhibits distension. There is abdominal tenderness.  Musculoskeletal:        General: No tenderness or edema. Normal range of motion.     Cervical back: Normal range of motion.  Neurological: [name] is alert and oriented to person, place, and time.  Skin: Skin is warm. [name] is diaphoretic.    Filed Weights   02/27/19 0914 02/27/19 0933 03/01/19 0422  Weight: 75.8 kg 75.3 kg 75.9 kg     Intake/Output Summary (Last 24 hours) at 03/02/2019 0646 Last data filed at 03/01/2019 2349 Gross per 24 hour  Intake --  Output 1700 ml  Net -1700 ml    Pertinent labs/Imaging: CBC Latest Ref Rng & Units 03/01/2019 02/28/2019 02/28/2019  WBC 4.0 - 10.5 K/uL 10.0 14.4(H) 12.5(H)  Hemoglobin 12.0 - 15.0 g/dL 8.8(L) 9.3(L) 8.6(L)  Hematocrit 36.0 - 46.0 % 27.7(L) 29.1(L) 27.0(L)  Platelets 150 - 400 K/uL 151 143(L) 140(L)    CMP Latest Ref Rng & Units 03/02/2019 03/01/2019 02/28/2019  Glucose 70 -  99 mg/dL 94 62(L) 187(H)  BUN 8 - 23 mg/dL 17 25(H) 27(H)  Creatinine 0.44 - 1.00 mg/dL 1.08(H) 1.36(H) 1.83(H)  Sodium 135 - 145 mmol/L 140 141 137  Potassium 3.5 - 5.1 mmol/L 3.9 3.6 4.0  Chloride 98 - 111 mmol/L 104 108 103  CO2 22 - 32 mmol/L 25 24 24   Calcium 8.9 - 10.3 mg/dL 8.4(L) 7.8(L) 7.7(L)  Total Protein 6.5 - 8.1 g/dL - - -  Total Bilirubin 0.3 - 1.2 mg/dL - - -  Alkaline Phos 38 - 126 U/L - - -  AST 15 - 41 U/L - - -  ALT 0 - 44 U/L - - -    DG Chest 2 View  Result Date: 03/01/2019 CLINICAL DATA:  New cough EXAM: CHEST - 2 VIEW COMPARISON:  02/26/2018 hila FINDINGS: Cardiomegaly. Both lungs are clear. The visualized skeletal structures are unremarkable. IMPRESSION: Cardiomegaly without acute abnormality of the lungs. Electronically Signed   By: Eddie Candle M.D.   On: 03/01/2019 11:30    Assessment/Plan:  Principal Problem:   E coli bacteremia Active Problems:   Acute encephalopathy   Patient Summary: [name] is a 64 y.o. with pertinent PMH of HTN, DM, IBS, peripheral neuropathy, depression, who presented with acute onset back pain and confusion and admit for E. Coli bactermeia 2/2  to UTI. on hospital day 3  #Sepsis fromE. Coli Bacteremia 2/2 UTI #Acute kidney injury Patient continued to clinically decline despite 3 days of IV antibiotics.  Patient continued to have significant fevers that worsen over the course of today.  I was called to the room for worsening of patient's symptoms.  CT scan of the abdomen shows signs and symptoms concerning for nephrolithiasis with resulting pyelonephritis.  Urology was consulted and the patient was taken to the OR.  Patient also developed a new neutropenia today.  Her acute onset of neutropenia is likely secondary to a medication side effect.  Hematology/oncology was consulted to determine antibiotic therapy moving forward. -Continue Ancef pending oncology recommendations -Continue IV fluids as needed for severe infection.    -Kidney function  # Diabetes Mellitus: -Continue diabetic medication management   # Hypertension:Continue to hold home antihypertensives in setting of low-normal blood pressure  Diet: N.p.o. IVF: Lactated Ringer's VTE: Enoxaparin Code: Full PT/OT recs: Pending TOC recs: Pending   Dispo: Anticipated discharge pending clinical improvement.    Dellia Cloud, D.O. [facility], PGY-1 Date 03/02/2019 Time 6:46 AM

## 2019-03-02 NOTE — Consult Note (Signed)
I have been asked to see the patient by Dr. Sandre Kitty, for evaluation and management of right ureteral calculus with sepsis.  History of present illness: 64 year old woman currently admitted to medicine with UTI and E. coli bacteremia with persistent fever.  A CT of her abdomen and pelvis on 03/02/2019 shows a 1 cm right UPJ calculus with findings concerning for obstruction and pyelonephritis.  Patient is febrile to 103 with rigors this morning.   Review of systems: A 12 point comprehensive review of systems was obtained and is negative unless otherwise stated in the history of present illness.  Patient Active Problem List   Diagnosis Date Noted  . E coli bacteremia 03/01/2019  . Acute encephalopathy 02/27/2019  . Type 1 diabetes mellitus with hypoglycemia and without coma (HCC) 08/15/2018  . Uncontrolled type 1 diabetes mellitus with hyperglycemia (HCC) 08/15/2018  . Peripheral neuropathy 02/07/2017  . Constipation due to outlet dysfunction 05/13/2016  . Breast density 06/25/2012  . Encephalopathy, toxic 04/15/2012  . Lithium toxicity 04/15/2012  . Suicide ideation 04/15/2012  . LADA (latent autoimmune diabetes in adults), managed as type 1 (HCC)   . Depression   . IBS (irritable bowel syndrome)   . HTN (hypertension)   . Bipolar 1 disorder (HCC)   . Abnormality of gait 11/07/2011  . Muscle weakness (generalized) 11/07/2011    No current facility-administered medications on file prior to encounter.   Current Outpatient Medications on File Prior to Encounter  Medication Sig Dispense Refill  . acetaminophen (TYLENOL) 500 MG tablet Take 1,000 mg by mouth every 6 (six) hours as needed for mild pain or headache.    . ALPRAZolam (XANAX) 1 MG tablet Take 1 mg by mouth 4 (four) times daily as needed for anxiety.     Marland Kitchen amLODipine (NORVASC) 5 MG tablet Take 5 mg by mouth daily.    Marland Kitchen BENICAR 40 MG tablet Take 40 mg by mouth daily.     . cholecalciferol (VITAMIN D3) 25 MCG (1000 UNIT) tablet  Take 1,000 Units by mouth daily.    Marland Kitchen gabapentin (NEURONTIN) 800 MG tablet Take 800 mg by mouth 3 (three) times daily.     Marland Kitchen glucagon (GLUCAGON EMERGENCY) 1 MG injection Inject 1 mg into the muscle once as needed. 1 each 12  . insulin aspart (NOVOLOG FLEXPEN) 100 UNIT/ML FlexPen Inject 10 Units into the skin daily after breakfast AND 8 Units daily before lunch AND 10 Units daily before supper. Max daily 40 units. 45 mL 3  . Insulin Glargine (LANTUS SOLOSTAR) 100 UNIT/ML Solostar Pen Inject 34 Units into the skin daily. 30 mL 3  . meclizine (ANTIVERT) 25 MG tablet Take 25 mg by mouth 4 (four) times daily as needed for dizziness.    . metFORMIN (GLUCOPHAGE) 500 MG tablet Take 1 tablet (500 mg total) by mouth 2 (two) times daily with a meal. 180 tablet 11  . nortriptyline (PAMELOR) 50 MG capsule Take 50 mg by mouth at bedtime.    Marland Kitchen omeprazole (PRILOSEC) 40 MG capsule Take 1 capsule (40 mg total) by mouth 2 (two) times daily. (Patient taking differently: Take 40 mg by mouth 2 (two) times daily. ) 180 capsule 3  . ondansetron (ZOFRAN-ODT) 4 MG disintegrating tablet PLACE 1 TO 2 TABLETS ON TONGUE EVERY 4 TO 6 HOURS AS NEEDED FOR NAUSEA 180 tablet 4  . OSCIMIN 0.125 MG SUBL DISSOLVE 1 TABLET UNDER THE TONGUE EVERY 4 HOURS AS NEEDED (NEED APPOINTMENT) (Patient taking differently: Place 1 tablet under the  tongue every 4 (four) hours as needed (IBS symptoms). ) 360 tablet 5  . Plecanatide (TRULANCE) 3 MG TABS Take 3 mg by mouth daily. 90 tablet 3  . Probiotic CAPS Take 1 capsule by mouth daily. 30 capsule 1  . simethicone (MYLICON) 80 MG chewable tablet Chew 80 mg by mouth every 6 (six) hours as needed for flatulence.    . traZODone (DESYREL) 100 MG tablet 1  qhs  May increse to 2  qhs  After 1 week (Patient taking differently: Take 100 mg by mouth at bedtime. 1  qhs  May increse to 2  qhs  After 1 week) 180 tablet 1  . AMBULATORY NON FORMULARY MEDICATION Squatty Potty x 1 1 each 0  . BD PEN NEEDLE NANO U/F  32G X 4 MM MISC USE WITH LANTUS AND NOVOLOG PENS 200 each 3  . desipramine (NORPRAMIN) 25 MG tablet 3  qam (Patient not taking: Reported on 02/27/2019) 270 tablet 0  . glucose blood (FREESTYLE LITE) test strip USE TO CHECK BLOOD SUGAR 6 TIMES PER DAY 600 each 2  . Insulin Syringes, Disposable, U-100 1 ML MISC To use for insulin injection.. 100 each 1  . Lancets (FREESTYLE) lancets USE TO TEST BLOOD SUGAR 4 TO 6 TIMES DAILY AS INSTRUCTED 300 each 6    Past Medical History:  Diagnosis Date  . Anxiety   . Bipolar 1 disorder (HCC)   . Breast density 06/25/2012   Right breast density, will get mammogram and [place]  . Depression   . Diabetes mellitus without complication (HCC)   . Duodenitis   . HTN (hypertension)   . Hyperlipidemia   . IBS (irritable bowel syndrome)     Past Surgical History:  Procedure Laterality Date  . ABDOMINAL HYSTERECTOMY    . CHOLECYSTECTOMY    . COLONOSCOPY      Social History   Tobacco Use  . Smoking status: Never Smoker  . Smokeless tobacco: Never Used  Substance Use Topics  . Alcohol use: No    Alcohol/week: 0.0 standard drinks  . Drug use: No    Family History  Problem Relation Age of Onset  . Hypertension Mother   . Bipolar disorder Mother   . Cancer Mother   . Heart disease Brother   . Thyroid disease Sister   . Hypertension Sister   . Bipolar disorder Sister   . Depression Father   . Cancer Father   . Bipolar disorder Daughter   . Bipolar disorder Maternal Aunt   . Colon cancer Neg Hx   . Colon polyps Neg Hx   . Esophageal cancer Neg Hx   . Kidney disease Neg Hx   . Gallbladder disease Neg Hx   . Diabetes Neg Hx   . Dementia Neg Hx     PE: Vitals:   03/02/19 0401 03/02/19 0737 03/02/19 0757 03/02/19 1015  BP: 122/70 (!) 154/76 (!) 155/66 (!) 154/70  Pulse: 94 (!) 141 (!) 132 (!) 132  Resp: 16 (!) 22 (!) 22 18  Temp: 98.6 F (37 C) (!) 103.2 F (39.6 C) (!) 100.4 F (38 C) 99.8 F (37.7 C)  TempSrc: Oral Oral Oral Oral   SpO2: 96% 94% 100% 99%  Weight:      Height:       Patient appears to be in no acute distress  patient is alert and oriented x3 Atraumatic normocephalic head No cervical or supraclavicular lymphadenopathy appreciated No increased work of breathing, no audible wheezes/rhonchi Regular  sinus rhythm/rate Abdomen is soft, nontender, nondistended, no CVA or suprapubic tenderness Lower extremities are symmetric without appreciable edema Grossly neurologically intact No identifiable skin lesions  Recent Labs    02/28/19 1421 03/01/19 0434 03/02/19 0834  WBC 14.4* 10.0 0.7*  HGB 9.3* 8.8* 9.9*  HCT 29.1* 27.7* 31.8*   Recent Labs    02/28/19 0310 03/01/19 0434 03/02/19 0446  NA 137 141 140  K 4.0 3.6 3.9  CL 103 108 104  CO2 24 24 25   GLUCOSE 187* 62* 94  BUN 27* 25* 17  CREATININE 1.83* 1.36* 1.08*  CALCIUM 7.7* 7.8* 8.4*   No results for input(s): LABPT, INR in the last 72 hours. No results for input(s): LABURIN in the last 72 hours. Results for orders placed or performed during the hospital encounter of 02/27/19  Blood Culture (routine x 2)     Status: Abnormal   Collection Time: 02/27/19  9:20 AM   Specimen: BLOOD  Result Value Ref Range Status   Specimen Description BLOOD RIGHT ANTECUBITAL  Final   Special Requests   Final    BOTTLES DRAWN AEROBIC AND ANAEROBIC Blood Culture adequate volume   Culture  Setup Time   Final    IN BOTH AEROBIC AND ANAEROBIC BOTTLES GRAM NEGATIVE RODS CRITICAL RESULT CALLED TO, READ BACK BY AND VERIFIED WITH: 03/01/19 [facility] 02/28/19 0046 JDW Performed at [facility], [address] [place], [place]    Culture ESCHERICHIA COLI (A)  Final   Report Status 03/01/2019 FINAL  Final   Organism ID, Bacteria ESCHERICHIA COLI  Final      Susceptibility   Escherichia coli - MIC*    AMPICILLIN >=32 RESISTANT Resistant     CEFAZOLIN <=4 SENSITIVE Sensitive     CEFEPIME <=0.12 SENSITIVE Sensitive     CEFTAZIDIME <=1 SENSITIVE  Sensitive     CEFTRIAXONE <=0.25 SENSITIVE Sensitive     CIPROFLOXACIN <=0.25 SENSITIVE Sensitive     GENTAMICIN >=16 RESISTANT Resistant     IMIPENEM <=0.25 SENSITIVE Sensitive     TRIMETH/SULFA >=320 RESISTANT Resistant     AMPICILLIN/SULBACTAM >=32 RESISTANT Resistant     PIP/TAZO <=4 SENSITIVE Sensitive     * ESCHERICHIA COLI  Blood Culture ID Panel (Reflexed)     Status: Abnormal   Collection Time: 02/27/19  9:20 AM  Result Value Ref Range Status   Enterococcus species NOT DETECTED NOT DETECTED Final   Listeria monocytogenes NOT DETECTED NOT DETECTED Final   Staphylococcus species NOT DETECTED NOT DETECTED Final   Staphylococcus aureus (BCID) NOT DETECTED NOT DETECTED Final   Streptococcus species NOT DETECTED NOT DETECTED Final   Streptococcus agalactiae NOT DETECTED NOT DETECTED Final   Streptococcus pneumoniae NOT DETECTED NOT DETECTED Final   Streptococcus pyogenes NOT DETECTED NOT DETECTED Final   Acinetobacter baumannii NOT DETECTED NOT DETECTED Final   Enterobacteriaceae species DETECTED (A) NOT DETECTED Final    Comment: Enterobacteriaceae represent a large family of gram-negative bacteria, not a single organism. CRITICAL RESULT CALLED TO, READ BACK BY AND VERIFIED WITH: G ABBOTT PHARMD 02/28/19 0046 JDW    Enterobacter cloacae complex NOT DETECTED NOT DETECTED Final   Escherichia coli DETECTED (A) NOT DETECTED Final    Comment: CRITICAL RESULT CALLED TO, READ BACK BY AND VERIFIED WITH: G ABBOTT PHARMD 02/28/19 0046 JDW    Klebsiella oxytoca NOT DETECTED NOT DETECTED Final   Klebsiella pneumoniae NOT DETECTED NOT DETECTED Final   Proteus species NOT DETECTED NOT DETECTED Final  Serratia marcescens NOT DETECTED NOT DETECTED Final   Carbapenem resistance NOT DETECTED NOT DETECTED Final   Haemophilus influenzae NOT DETECTED NOT DETECTED Final   Neisseria meningitidis NOT DETECTED NOT DETECTED Final   Pseudomonas aeruginosa NOT DETECTED NOT DETECTED Final   Candida  albicans NOT DETECTED NOT DETECTED Final   Candida glabrata NOT DETECTED NOT DETECTED Final   Candida krusei NOT DETECTED NOT DETECTED Final   Candida parapsilosis NOT DETECTED NOT DETECTED Final   Candida tropicalis NOT DETECTED NOT DETECTED Final    Comment: Performed at [facility], [place] [address], [place], [place] [address]  Blood Culture (routine x 2)     Status: Abnormal   Collection Time: 02/27/19  9:35 AM   Specimen: BLOOD  Result Value Ref Range Status   Specimen Description BLOOD LEFT ANTECUBITAL  Final   Special Requests   Final    BOTTLES DRAWN AEROBIC AND ANAEROBIC Blood Culture adequate volume   Culture  Setup Time   Final    GRAM NEGATIVE RODS IN BOTH AEROBIC AND ANAEROBIC BOTTLES CRITICAL VALUE NOTED.  VALUE IS CONSISTENT WITH PREVIOUSLY REPORTED AND CALLED VALUE.    Culture (A)  Final    ESCHERICHIA COLI SUSCEPTIBILITIES PERFORMED ON PREVIOUS CULTURE WITHIN THE LAST 5 DAYS. Performed at [facility], [place] [address], [place], [place] [address]    Report Status 03/02/2019 FINAL  Final  SARS CORONAVIRUS 2 (TAT 6-24 HRS) Nasopharyngeal Nasopharyngeal Swab     Status: None   Collection Time: 02/27/19 12:11 PM   Specimen: Nasopharyngeal Swab  Result Value Ref Range Status   SARS Coronavirus 2 NEGATIVE NEGATIVE Final    Comment: (NOTE) SARS-CoV-2 target nucleic acids are NOT DETECTED. The SARS-CoV-2 RNA is generally detectable in upper and lower respiratory specimens during the acute phase of infection. Negative results do not preclude SARS-CoV-2 infection, do not rule out co-infections with other pathogens, and should not be used as the sole basis for treatment or other patient management decisions. Negative results must be combined with clinical observations, patient history, and epidemiological information. The expected result is Negative. Fact Sheet for Patients: SugarRoll.be Fact Sheet for Healthcare  Providers: https://www.woods-mathews.com/ This test is not yet approved or cleared by the [place] FDA and  has been authorized for detection and/or diagnosis of SARS-CoV-2 by FDA under an Emergency Use Authorization (EUA). This EUA will remain  in effect (meaning this test can be used) for the duration of the COVID-19 declaration under Section 56 4(b)(1) of the Act, 21 U.S.C. section 360bbb-3(b)(1), unless the authorization is terminated or revoked sooner. Performed at [facility], [place] [address], [place], [place] [address]   Urine culture     Status: None   Collection Time: 02/27/19  4:46 PM   Specimen: In/Out Cath Urine  Result Value Ref Range Status   Specimen Description IN/OUT CATH URINE  Final   Special Requests NONE  Final   Culture   Final    NO GROWTH Performed at [facility], [place] [address] [place], [place] [address]    Report Status 02/28/2019 FINAL  Final    Imaging: CLINICAL DATA:  Abdominal pain. Fever.  EXAM: CT ABDOMEN AND PELVIS WITH CONTRAST  TECHNIQUE: Multidetector CT imaging of the abdomen and pelvis was performed using the standard protocol following bolus administration of intravenous contrast.  CONTRAST:  137mL OMNIPAQUE IOHEXOL 300 MG/ML  SOLN  COMPARISON:  Renal ultrasound dated 02/28/2019  FINDINGS: Lower chest: Minimal  pericardial effusion. Heart size is normal. Lung bases are clear except for minimal linear scarring or atelectasis at the left base.  Hepatobiliary: No focal liver abnormality is seen. Status post cholecystectomy. No biliary dilatation.  Pancreas: Unremarkable. No pancreatic ductal dilatation or surrounding inflammatory changes.  Spleen: Normal in size without focal abnormality.  Adrenals/Urinary Tract: There is a 10 mm stone in the right ureteral pelvic junction with slight prominence of pelvicaliceal system. There is edema of the right kidney with a focal area of  decreased perfusion in the posterior aspect of the lower pole likely representing pyelonephritis. There are several small cysts in the otherwise normal left kidney. The largest cyst is in the upper pole of the left kidney measuring 19 mm. The bladder is distended but are otherwise normal.  The adrenal glands are normal.  Stomach/Bowel: Stomach is within normal limits. Appendix appears normal. No evidence of bowel wall thickening, distention, or inflammatory changes.  Vascular/Lymphatic: Aortic atherosclerosis. No enlarged abdominal or pelvic lymph nodes.  Reproductive: Status post hysterectomy. No adnexal masses.  Other: No ascites. Slight edema in the subcutaneous fat of both flanks and the lateral aspect of the buttocks and hips.  Musculoskeletal: No acute or significant osseous findings.  IMPRESSION: 1. 10 mm stone obstructing the right ureteropelvic junction. 2. Focal area of decreased perfusion in the posterior aspect of the lower pole of the right kidney consistent with pyelonephritis.  Aortic Atherosclerosis (ICD10-I70.0).  Imp: 64 year old woman with E. coli bacteremia and 1 cm right UPJ calculus with persistent signs of sepsis.  Recommendations: -Risks and benefits of a cystoscopy with right retrograde pyelogram and ureteral stent placement were discussed with the patient including bleeding, infection, pain, stent discomfort, inability to place stent, damage to surrounding structures, need for staged procedure to remove stone at a later date, for need for other procedures such as PCN tube.  Informed consent obtained. -surgery  today for urgent right ureteral stent placement  Thank you for involving me in this patient's care, I will continue to follow along. Please page with any further questions or concerns. Leith Szafranski D Mattie Novosel

## 2019-03-02 NOTE — H&P (View-Only) (Signed)
Pharmacy Antibiotic Note  [name] is a 64 y.o. female admitted on 02/27/2019 with bacteremia.  Pharmacy has been consulted for cefepime and vancomycin dosing. Patient growing e.coli in blood cultures sensitive to ceftriaxone and cefazolin, however patient getting clinically worse per MD. MD wishes to expand coverage  to cefepime and vancomycin despite cultures.   Plan: Cefepime 2gm IV q8h Vancomycin 1500mg  Iv x 1, then 1000mg  IV q24h F/u de-escalation based on culture data.  F/u clinical course and LOT    Height: 5\' 4"  (162.6 cm) Weight: 167 lb 5.3 oz (75.9 kg) IBW/kg (Calculated) : 54.7  Temp (24hrs), Avg:100.5 F (38.1 C), Min:98.6 F (37 C), Max:103.2 F (39.6 C)  Recent Labs  Lab 02/27/19 0920 02/27/19 0922 02/27/19 1604 02/28/19 0310 02/28/19 1233 02/28/19 1421 03/01/19 0434 03/02/19 0446  WBC 6.3  --   --  12.5*  --  14.4* 10.0  --   CREATININE 1.58*  --   --  1.83*  --   --  1.36* 1.08*  LATICACIDVEN  --  5.2* 5.2*  --  2.0*  --   --   --     Estimated Creatinine Clearance: 51.1 mL/min (A) (by C-G formula based on SCr of 1.08 mg/dL (H)).    Allergies  Allergen Reactions  . Abilify [Aripiprazole]     Patient is intolerant  . Reglan [Metoclopramide] Other (See Comments)    Chest pains    Antimicrobials this admission: Vanc 2/26 x 1, 3/1>> Cefepime 2/26 x 1, 3/1>> Flagyl x 1 2/26 x 1 Rocephin 2/27 >> 3/1  Microbiology results: 2/26 COVID >> neg 2/26 BCx >> 2/2 E.coli (pan-S except R-amp/gent/bactrim/unasyn) 2/26 UCx >> NG (after abx given)  Asmi Fugere A. 3/26, PharmD, BCPS, FNKF Clinical Pharmacist  Please utilize Amion for appropriate phone number to reach the unit pharmacist [facility])   03/02/2019 8:45 AM/

## 2019-03-02 NOTE — OR Nursing (Signed)
Pt. Came to PACU on ventilator at 1330.  Was able to follow commands and take deep breaths while on vent and was able to be extubated at 1405 with anesthesia at bedside.  Pt. BP down to 87/42 at 1400 so Neo was started at 51mcg/min and titrated up to maintain a MAP of 60. After 1.5 hrs of not being able to get the drip off, anesthesia was called to assess. Pt. Is A&Ox3, and not in pain. Will continue to monitor.

## 2019-03-02 NOTE — Significant Event (Signed)
Rapid Response Event Note  Overview: Time Called: 0740 Arrival Time: 0800 Event Type: MEWS Called for tachycardia and fever.  Initial Focused Assessment: Pt lying in bed, awake, alert. Able to follow commands and move all extremities. Grips equal. No drift in bilateral arms, bilateral legs weak with no drift. PERRLA, sluggish, EOMI. Lung sounds are clear, diminished. Abdomen is soft. Pt is restless, stating she has to pee. Pt able to void in bedpan. Tremor noted in pt hands.  Interventions: -EKG: completed, results  -La, trop, blood cx, CBC with differential -Bladder scan 250cc noted in bladder, pt voided into bedpan -500 cc LR bolus -CT abdomen: 10 mm stone obstructing the right ureteropelvic junction. Focal area of decreased perfusion in the posterior aspect of the lower pole of the right kidney consistent with pyelonephritis.  Plan of Care (if not transferred): -Urology later consulted by IMTS for right ureteral stent placement.  -Trend VS -Encourage sleep/wake cycle -Follow-up with MD results of labs ordered -Continue current treatment plan  Call rapid response for further needs  Event Summary: Name of Physician Notified: IMTS at 0800    at    Outcome: Stayed in room and stabalized  Event End Time: 0855  Jennye Moccasin

## 2019-03-02 NOTE — Progress Notes (Signed)
Pharmacy Antibiotic Note  [name] is a 64 y.o. female admitted on 02/27/2019 with bacteremia.  Pharmacy consulted for cefepime and vancomycin dosing this morning. Patient growing E coli in blood cultures sensitive to ceftriaxone and cefazolin, however patient was getting clinically worse per MD and MD wished to expand coverage.   Now s/p R ureteral stent, changing to Cefazolin per IM team d/w ID.  WBC down to 0.7 this am,and concern for possible drug-induced neutropenia, but repeat tonight is 9.2.   Cefepime x 1 given ~12n.  Plan: Cefazolin 2 gm IV q8hrs. Follow renal function, culture data, clinical course and antibiotic plans.   Height: 5\' 4"  (162.6 cm) Weight: 167 lb 5.3 oz (75.9 kg) IBW/kg (Calculated) : 54.7  Temp (24hrs), Avg:100.3 F (37.9 C), Min:98.5 F (36.9 C), Max:103.2 F (39.6 C)  Recent Labs  Lab 02/27/19 0920 02/27/19 0920 02/27/19 0922 02/27/19 1604 02/28/19 0310 02/28/19 1233 02/28/19 1421 03/01/19 0434 03/02/19 0446 03/02/19 0834 03/02/19 1042 03/02/19 1621  WBC 6.3   < >  --   --  12.5*  --  14.4* 10.0  --  0.7*  --  9.2  CREATININE 1.58*  --   --   --  1.83*  --   --  1.36* 1.08*  --   --   --   LATICACIDVEN  --   --  5.2* 5.2*  --  2.0*  --   --   --  3.3* 2.3*  --    < > = values in this interval not displayed.    Estimated Creatinine Clearance: 51.1 mL/min (A) (by C-G formula based on SCr of 1.08 mg/dL (H)).    Allergies  Allergen Reactions  . Abilify [Aripiprazole]     Patient is intolerant  . Reglan [Metoclopramide] Other (See Comments)    Chest pains    Antimicrobials this admission: Vanc 2/26 x 1, 3/1 >>(cancelled, did not receive) Cefepime 2/26 x 1, 3/1 x 1 dose Flagyl x 1 2/26 x 1 Rocephin 2/27 >> 3/1 Cefazolin 3/1 >>  Microbiology results: 2/26 COVID: negative 2/26 blood: 2/2 E.coli (pan-S except R-amp/gent/bactrim/unasyn) 2/26 urine: negative (after abx given) 3/1 blood x 2: sent  3/26, RPh Phone:  Dennie Fetters 03/02/2019 6:33 PM

## 2019-03-02 NOTE — Significant Event (Signed)
Rapid Response Event Note  Overview: Time Called: 0740 Event Type: MEWS(Tachycardia and fever)  Fever of 103.40F and tachycardic at 141 bpm. Pt has been spiking fevers and has been having intermittent tachycardia with HR in the low 100s. RN encouraged to give Tylenol and apply ice packs. RR RN to round on pt after Tylenol has had time to take effect, RN to call RR RN back if needed prior to arrival.   Jennye Moccasin

## 2019-03-02 NOTE — Progress Notes (Signed)
Pharmacy Antibiotic Note  [name] is a 64 y.o. female admitted on 02/27/2019 with bacteremia.  Pharmacy has been consulted for cefepime and vancomycin dosing. Patient growing e.coli in blood cultures sensitive to ceftriaxone and cefazolin, however patient getting clinically worse per MD. MD wishes to expand coverage  to cefepime and vancomycin despite cultures.   Plan: Cefepime 2gm IV q8h Vancomycin 1500mg Iv x 1, then 1000mg IV q24h F/u de-escalation based on culture data.  F/u clinical course and LOT    Height: 5' 4" (162.6 cm) Weight: 167 lb 5.3 oz (75.9 kg) IBW/kg (Calculated) : 54.7  Temp (24hrs), Avg:100.5 F (38.1 C), Min:98.6 F (37 C), Max:103.2 F (39.6 C)  Recent Labs  Lab 02/27/19 0920 02/27/19 0922 02/27/19 1604 02/28/19 0310 02/28/19 1233 02/28/19 1421 03/01/19 0434 03/02/19 0446  WBC 6.3  --   --  12.5*  --  14.4* 10.0  --   CREATININE 1.58*  --   --  1.83*  --   --  1.36* 1.08*  LATICACIDVEN  --  5.2* 5.2*  --  2.0*  --   --   --     Estimated Creatinine Clearance: 51.1 mL/min (A) (by C-G formula based on SCr of 1.08 mg/dL (H)).    Allergies  Allergen Reactions  . Abilify [Aripiprazole]     Patient is intolerant  . Reglan [Metoclopramide] Other (See Comments)    Chest pains    Antimicrobials this admission: Vanc 2/26 x 1, 3/1>> Cefepime 2/26 x 1, 3/1>> Flagyl x 1 2/26 x 1 Rocephin 2/27 >> 3/1  Microbiology results: 2/26 COVID >> neg 2/26 BCx >> 2/2 E.coli (pan-S except R-amp/gent/bactrim/unasyn) 2/26 UCx >> NG (after abx given)  Latrelle Fuston A. Preston Garabedian, PharmD, BCPS, FNKF Clinical Pharmacist [place] Please utilize Amion for appropriate phone number to reach the unit pharmacist ([facility])   03/02/2019 8:45 AM/  

## 2019-03-02 NOTE — Anesthesia Procedure Notes (Signed)
Procedure Name: Intubation Date/Time: 03/02/2019 12:10 PM Performed by: Amadeo Garnet, CRNA Pre-anesthesia Checklist: Patient identified, Emergency Drugs available, Suction available and Patient being monitored Patient Re-evaluated:Patient Re-evaluated prior to induction Oxygen Delivery Method: Circle system utilized Preoxygenation: Pre-oxygenation with 100% oxygen Induction Type: IV induction Ventilation: Mask ventilation without difficulty Laryngoscope Size: Mac and 3 Grade View: Grade I Tube type: Oral Tube size: 7.0 mm Number of attempts: 1 Placement Confirmation: ETT inserted through vocal cords under direct vision,  positive ETCO2 and breath sounds checked- equal and bilateral Secured at: 21 cm Tube secured with: Tape Dental Injury: Teeth and Oropharynx as per pre-operative assessment

## 2019-03-02 NOTE — Op Note (Signed)
Operative Note  Preoperative diagnosis:  1. Right Ureteral calculus  Post operative diagnosis: 1. Right Ureteral calculus  Procedure(s): 1.  Cystoscopy with right ureteral stent placement  Surgeon: Kasandra Knudsen, MD  Assistants: None  Anesthesia: General  Complications: None immediate  EBL: Minimal  Specimens: 1.  None  Drains/Catheters: 1.  6 X 24 double-J ureteral stent  Intraoperative findings: 1.  Normal urethra  2.  Immediate efflux of pus from right ureteral orifice after cannulation with wire 3.  Good proximal curl of ureteral stent seen on fluoroscopy 3. Bladder widely erythematous consistent with UTI  Indication: 64 year old woman with 1 cm right UPJ calculus with urosepsis and E. coli bacteremia.  Description of procedure:  The patient was identified and consent was obtained.  The patient was taken to the operating room and placed in the supine position.  The patient was placed under general anesthesia.  Perioperative antibiotics were administered.  The patient was placed in dorsal lithotomy.  Patient was prepped and draped in a standard sterile fashion and a timeout was performed.  A 21 French rigid cystoscope was advanced into the urethra and into the bladder.  Fluoroscopy showed residual contrast in the right collecting system outlining the kidney.  A sensor wire was then advanced up to the kidney under fluoroscopic guidance.  Immediate reflux of purulent urine was seen from right ureteral orifice. A 6 X 24 double-J ureteral stent was advanced up to the kidney under fluoroscopic guidance.  The wire was withdrawn and fluoroscopy confirmed good proximal placement and direct visualization confirmed a good coil within the bladder.  The bladder was drained and the scope withdrawn.  This concluded the operation.  Patient tolerated procedure well and was stable postoperatively.  Plan: -Patient will be transferred back to the care of inpatient medicine team -Recommend  continuation of Foley catheter for maximal drainage for next 2 to 3 days -Continue antibiotics tailored to blood culture and urine culture sensitivities -Patient will need to be scheduled for definitive stone management with ureteroscopy in the future

## 2019-03-02 NOTE — Anesthesia Preprocedure Evaluation (Addendum)
Anesthesia Evaluation  Patient identified by MRN, date of birth, ID band Patient confused    Reviewed: Allergy & Precautions, NPO status , Patient's Chart, lab work & pertinent test results  Airway Mallampati: III  TM Distance: >3 FB Neck ROM: Full    Dental  (+) Dental Advisory Given, Edentulous Lower, Edentulous Upper   Pulmonary neg pulmonary ROS,    Pulmonary exam normal breath sounds clear to auscultation       Cardiovascular hypertension, Normal cardiovascular exam Rhythm:Regular Rate:Normal     Neuro/Psych PSYCHIATRIC DISORDERS Anxiety Depression Bipolar Disorder  Neuromuscular disease    GI/Hepatic Neg liver ROS, GERD  Medicated,  Endo/Other  negative endocrine ROSdiabetes, Type 2, Insulin Dependent  Renal/GU urinary track infection with sepsis and obstructing stone     Musculoskeletal negative musculoskeletal ROS (+)   Abdominal   Peds  Hematology  (+) Blood dyscrasia (Thrombocytopenia, Hgb 9.9), anemia ,   Anesthesia Other Findings Day of surgery medications reviewed with the patient.  Reproductive/Obstetrics                            Anesthesia Physical Anesthesia Plan  ASA: III and emergent  Anesthesia Plan: General   Post-op Pain Management:    Induction: Intravenous, Rapid sequence and Cricoid pressure planned  PONV Risk Score and Plan: 4 or greater and Dexamethasone, Ondansetron and Treatment may vary due to age or medical condition  Airway Management Planned: Oral ETT  Additional Equipment:   Intra-op Plan:   Post-operative Plan: Possible Post-op intubation/ventilation  Informed Consent: I have reviewed the patients History and Physical, chart, labs and discussed the procedure including the risks, benefits and alternatives for the proposed anesthesia with the patient or authorized representative who has indicated his/her understanding and acceptance.     Dental  advisory given and Only emergency history available  Plan Discussed with: CRNA  Anesthesia Plan Comments:         Anesthesia Quick Evaluation

## 2019-03-02 NOTE — Transfer of Care (Signed)
Immediate Anesthesia Transfer of Care Note  Patient: MEKAYLAH KLICH  Procedure(s) Performed: CYSTOSCOPY WITH STENT PLACEMENT (Right Urethra)  Patient Location: PACU  Anesthesia Type:General  Level of Consciousness: drowsy and Patient remains intubated per anesthesia plan  Airway & Oxygen Therapy: Patient remains intubated per anesthesia plan and Patient placed on Ventilator (see vital sign flow sheet for setting)  Post-op Assessment: Report given to RN, Post -op Vital signs reviewed and stable and Patient moving all extremities  Post vital signs: Reviewed and stable  Last Vitals:  Vitals Value Taken Time  BP 84/37 03/02/19 1354  Temp 38.6 C 03/02/19 1330  Pulse 108 03/02/19 1355  Resp 18 03/02/19 1355  SpO2 95 % 03/02/19 1355  Vitals shown include unvalidated device data.  Last Pain:  Vitals:   03/02/19 1156  TempSrc:   PainSc: 0-No pain      Patients Stated Pain Goal: 1 (02/27/19 2138)  Complications: No apparent anesthesia complications

## 2019-03-02 NOTE — Progress Notes (Signed)
OT Cancellation Note  Patient Details Name: [name] MRN: [id number] DOB: Jan 26, 1955   Cancelled Treatment:    Reason Eval/Treat Not Completed: Patient at procedure or test/ unavailable;Other (comment) Will check back for OT session as time allows.   Audery Amel., COTA/L Acute Rehabilitation Services [phone]   Angelina Pih 03/02/2019, 4:14 PM

## 2019-03-02 NOTE — Consult Note (Addendum)
[name]  [phone] [phone]  ID: [name] OB: 1955-09-01 MR#: [id number] DDU#:[id number] PCP: Redmond School, MD  CHIEF COMPLAINT: Neutropenia  INTERVAL HISTORY: [name] is a 64 year old female from [place], [place] with a past medical history significant for hypertension, diabetes, IBS, peripheral neuropathy, depression.  She presented to the hospital with confusion and back pain. Husband reported that confusion and pain were similar to symptoms she had when she had a prior UTI. On admission, her WBC was 6.3, hemoglobin 11.8, platelet count 197,000.  Today, the patient is total white blood cell count dropped significantly down to 0.7 (was 10.0 on 03/01/2019) and her ANC is down to 0.4.  Her hemoglobin was 9.9 this morning and platelet count 136,000. The patient has been diagnosed with urosepsis secondary to E. coli.  She is status post 1 dose of cefepime, vancomycin, and Flagyl which was switched to ceftriaxone. Ceftriaxone stopped earlier today and now back on Cefepime and Vancomycin. She continues to have fevers up to 103 with chills. Seen by Urology earlier today and taken for a cystoscopy and ureteral stent placement. Hematology was asked to the patient to make recommendations regarding her neutropenia.  REVIEW OF SYSTEMS: Had cystoscopy earlier today. Had hypotension post-procedure and require Neo. Had difficulty getting patient off drip. She continues to have fevers and chills. Husband says confusion has improved. Has issues at home with IBS - alternating constipation and diarrhea. No recent anorexia or weight loss. No chest pain or shortness of breath. No other complaints reported. The remainder of the ROS is noncontributory.   PAST MEDICAL HISTORY: Past Medical History:  Diagnosis Date   Anxiety    Bipolar 1 disorder ([place])    Breast density 06/25/2012   Right breast density, will get mammogram and [place]   Depression    Diabetes mellitus  without complication (HCC)    Duodenitis    HTN (hypertension)    Hyperlipidemia    IBS (irritable bowel syndrome)    PAST SURGICAL HISTORY: Past Surgical History:  Procedure Laterality Date   ABDOMINAL HYSTERECTOMY     CHOLECYSTECTOMY     COLONOSCOPY     FAMILY HISTORY Family History  Problem Relation Age of Onset   Hypertension Mother    Bipolar disorder Mother    Cancer Mother    Heart disease Brother    Thyroid disease Sister    Hypertension Sister    Bipolar disorder Sister    Depression Father    Cancer Father    Bipolar disorder Daughter    Bipolar disorder Maternal Aunt    Colon cancer Neg Hx    Colon polyps Neg Hx    Esophageal cancer Neg Hx    Kidney disease Neg Hx    Gallbladder disease Neg Hx    Diabetes Neg Hx    Dementia Neg Hx    SOCIAL HISTORY: The patient is married. She is disabled. She has 1 daughter who lives in [place], [place]. Denies history of alcohol or tobacco use.   ADVANCED DIRECTIVES: None on file.  HEALTH MAINTENANCE: Social History   Tobacco Use   Smoking status: Never Smoker   Smokeless tobacco: Never Used  Substance Use Topics   Alcohol use: No    Alcohol/week: 0.0 standard drinks   Drug use: No   Colonoscopy: PAP: Bone density: Lipid panel: 02/28/2019  Allergies  Allergen Reactions   Abilify [Aripiprazole]     Patient is intolerant   Reglan [Metoclopramide] Other (See Comments)  Chest pains   Current Facility-Administered Medications  Medication Dose Route Frequency Provider Last Rate Last Admin   acetaminophen (TYLENOL) tablet 650 mg  650 mg Oral Q6H PRN Chundi, Sherlyn Lees, MD   650 mg at 03/02/19 4580   Or   acetaminophen (TYLENOL) suppository 650 mg  650 mg Rectal Q6H PRN Chundi, Sherlyn Lees, MD       ALPRAZolam Prudy Feeler) tablet 1 mg  1 mg Oral QID PRN Chundi, Vahini, MD   1 mg at 03/01/19 1840   ceFEPIme (MAXIPIME) 2 g in sodium chloride 0.9 % 100 mL IVPB  2 g Intravenous Q8H Pierce, Dwayne A, RPH       cholecalciferol  (VITAMIN D3) tablet 1,000 Units  1,000 Units Oral Daily Chundi, Vahini, MD   1,000 Units at 03/02/19 1047   gabapentin (NEURONTIN) capsule 400 mg  400 mg Oral TID Katherine Roan, MD   400 mg at 03/02/19 1047   heparin injection 5,000 Units  5,000 Units Subcutaneous Q8H Chundi, Sherlyn Lees, MD   5,000 Units at 03/02/19 0531   HYDROmorphone (DILAUDID) injection 0.5 mg  0.5 mg Intravenous Q4H PRN Chundi, Sherlyn Lees, MD   0.5 mg at 02/27/19 2137   insulin aspart (novoLOG) injection 0-15 Units  0-15 Units Subcutaneous TID WC Chundi, Vahini, MD   2 Units at 02/28/19 1620   insulin glargine (LANTUS) injection 20 Units  20 Units Subcutaneous Daily Chundi, Sherlyn Lees, MD   20 Units at 03/02/19 1047   lactated ringers bolus 500 mL  500 mL Intravenous Once Margarita Grizzle, MD       pantoprazole (PROTONIX) EC tablet 80 mg  80 mg Oral Daily Chundi, Vahini, MD   80 mg at 03/02/19 1047   promethazine (PHENERGAN) tablet 12.5 mg  12.5 mg Oral Q6H PRN Chundi, Vahini, MD       Or   promethazine (PHENERGAN) injection 12.5 mg  12.5 mg Intravenous Q6H PRN Chundi, Vahini, MD       Or   promethazine (PHENERGAN) suppository 12.5 mg  12.5 mg Rectal Q6H PRN Chundi, Vahini, MD       senna-docusate (Senokot-S) tablet 1 tablet  1 tablet Oral QHS PRN Chundi, Vahini, MD       traZODone (DESYREL) tablet 100 mg  100 mg Oral QHS Chundi, Vahini, MD   100 mg at 03/01/19 2116   OBJECTIVE: Vitals:   03/02/19 0757 03/02/19 1015  BP: (!) 155/66 (!) 154/70  Pulse: (!) 132 (!) 132  Resp: (!) 22 18  Temp: (!) 100.4 F (38 C) 99.8 F (37.7 C)  SpO2: 100% 99%   Body mass index is 28.72 kg/m. ECOG FS:1 - Symptomatic but completely ambulatory Ocular: Sclerae unicteric, pupils equal, round and reactive to light Ear-nose-throat: Oropharynx clear, dentition fair Lymphatic: No cervical or supraclavicular adenopathy Lungs no rales or rhonchi, good excursion bilaterally Heart regular rate and rhythm, no murmur appreciated Abd soft, nontender,  positive bowel sounds MSK no focal spinal tenderness, no joint edema Neuro: non-focal, well-oriented, appropriate affect  LAB RESULTS: CMP     Component Value Date/Time   NA 140 03/02/2019 0446   K 3.9 03/02/2019 0446   CL 104 03/02/2019 0446   CO2 25 03/02/2019 0446   GLUCOSE 94 03/02/2019 0446   BUN 17 03/02/2019 0446   CREATININE 1.08 (H) 03/02/2019 0446   CREATININE 0.82 12/16/2018 1134   CALCIUM 8.4 (L) 03/02/2019 0446   PROT 8.1 02/27/2019 0920   ALBUMIN 4.0 02/27/2019 0920   AST 30 02/27/2019 0920  ALT 18 02/27/2019 0920   ALKPHOS 112 02/27/2019 0920   BILITOT 0.3 02/27/2019 0920   GFRNONAA 53 (L) 03/02/2019 0446   GFRNONAA 75 12/16/2018 1134   GFRAA >60 03/02/2019 0446   GFRAA 87 12/16/2018 1134   INo results found for: SPEP, UPEP Lab Results  Component Value Date   WBC 0.7 (LL) 03/02/2019   NEUTROABS 0.4 (L) 03/02/2019   HGB 9.9 (L) 03/02/2019   HCT 31.8 (L) 03/02/2019   MCV 83.9 03/02/2019   PLT 136 (L) 03/02/2019   @LASTCHEMISTRY @ No results found for: LABCA2 No components found for: LABCA125 Recent Labs  Lab 02/27/19 0920  INR 1.1   Urinalysis    Component Value Date/Time   COLORURINE YELLOW 02/27/2019 1656   APPEARANCEUR CLEAR 02/27/2019 1656   LABSPEC 1.015 02/27/2019 1656   PHURINE 6.0 02/27/2019 1656   GLUCOSEU 50 (A) 02/27/2019 1656   HGBUR MODERATE (A) 02/27/2019 1656   BILIRUBINUR NEGATIVE 02/27/2019 1656   KETONESUR NEGATIVE 02/27/2019 1656   PROTEINUR 30 (A) 02/27/2019 1656   UROBILINOGEN 0.2 04/15/2012 0907   NITRITE POSITIVE (A) 02/27/2019 1656   LEUKOCYTESUR SMALL (A) 02/27/2019 1656   STUDIES: DG Chest 2 View  Result Date: 03/01/2019 CLINICAL DATA:  New cough EXAM: CHEST - 2 VIEW COMPARISON:  02/26/2018 hila FINDINGS: Cardiomegaly. Both lungs are clear. The visualized skeletal structures are unremarkable. IMPRESSION: Cardiomegaly without acute abnormality of the lungs. Electronically Signed   By: 02/28/2018 M.D.   On:  03/01/2019 11:30   CT ABDOMEN PELVIS W CONTRAST  Result Date: 03/02/2019 CLINICAL DATA:  Abdominal pain. Fever. EXAM: CT ABDOMEN AND PELVIS WITH CONTRAST TECHNIQUE: Multidetector CT imaging of the abdomen and pelvis was performed using the standard protocol following bolus administration of intravenous contrast. CONTRAST:  05/02/2019 OMNIPAQUE IOHEXOL 300 MG/ML  SOLN COMPARISON:  Renal ultrasound dated 02/28/2019 FINDINGS: Lower chest: Minimal pericardial effusion. Heart size is normal. Lung bases are clear except for minimal linear scarring or atelectasis at the left base. Hepatobiliary: No focal liver abnormality is seen. Status post cholecystectomy. No biliary dilatation. Pancreas: Unremarkable. No pancreatic ductal dilatation or surrounding inflammatory changes. Spleen: Normal in size without focal abnormality. Adrenals/Urinary Tract: There is a 10 mm stone in the right ureteral pelvic junction with slight prominence of pelvicaliceal system. There is edema of the right kidney with a focal area of decreased perfusion in the posterior aspect of the lower pole likely representing pyelonephritis. There are several small cysts in the otherwise normal left kidney. The largest cyst is in the upper pole of the left kidney measuring 19 mm. The bladder is distended but are otherwise normal. The adrenal glands are normal. Stomach/Bowel: Stomach is within normal limits. Appendix appears normal. No evidence of bowel wall thickening, distention, or inflammatory changes. Vascular/Lymphatic: Aortic atherosclerosis. No enlarged abdominal or pelvic lymph nodes. Reproductive: Status post hysterectomy. No adnexal masses. Other: No ascites. Slight edema in the subcutaneous fat of both flanks and the lateral aspect of the buttocks and hips. Musculoskeletal: No acute or significant osseous findings. IMPRESSION: 1. 10 mm stone obstructing the right ureteropelvic junction. 2. Focal area of decreased perfusion in the posterior aspect of  the lower pole of the right kidney consistent with pyelonephritis. Aortic Atherosclerosis (ICD10-I70.0). Electronically Signed   By: 03/02/2019 M.D.   On: 03/02/2019 09:34   05/02/2019 RENAL  Result Date: 02/28/2019 CLINICAL DATA:  UTI and acute kidney injury with history of diabetes and hypertension EXAM: RENAL / URINARY TRACT ULTRASOUND COMPLETE COMPARISON:  Ultrasound study of 03/01/2014 FINDINGS: Right Kidney: Renal measurements: 11 x 6.6 x 6.6 cm = volume: 247 mL. Limited assessment of parenchyma without gross mass and without signs of hydronephrosis. Cortical thickness appears to be preserved. Study limited by patient body habitus. Left Kidney: Renal measurements: 10.6 x 7.0 x 5.6 cm = volume: 219 mL. Limited assessment due to body habitus without visible mass or hydronephrosis. Bladder: Moderately distended at 562 mL Other: None. IMPRESSION: 1. No signs of hydronephrosis or acute finding on limited evaluation due to patient body habitus. Electronically Signed   By: Donzetta Kohut M.D.   On: 02/28/2019 15:30   DG Chest Port 1 View  Result Date: 02/27/2019 CLINICAL DATA:  Cough and fever EXAM: PORTABLE CHEST 1 VIEW COMPARISON:  09/01/2003 FINDINGS: The heart size and mediastinal contours are within normal limits. Both lungs are clear. The visualized skeletal structures are unremarkable. IMPRESSION: No active disease. Electronically Signed   By: Marlan Palau M.D.   On: 02/27/2019 09:53   ASSESSMENT: 64 y.o. female from [place], [place] who has developed neutropenia in the setting of urosepsis.  PLAN: Ms. Canizales white blood cell count was completely normal on admission and in fact was normal up until today (03/02/2019).  She now has significant neutropenia.  Peripheral blood smear to be reviewed. Suspect her neutropenia is multifactorial.  She has urosepsis and is on IV antibiotics and these are likely the causes of her neutropenia. Recommend close monitoring of her CBC. Attempt to minimize  medications that may suppress her bone marrow, but she will need IV antibiotics to treat her urosepsis.   Clenton Pare, NP 03/02/2019 10:55 AM   ADDENDUM: 64 y/o [place] woman admitted with E. Coli urosepsis, on multiple antibiotics initially, admission WBC 6.3, with sudden drop to 0.7 on 03.01/2019 AM, but repeat same day in the evening 9.2 and this AM 8.0.  I reviewed the blood film from 03/02/2019 AM and confirmed severe leukopenia, mod thrombocytopenia, no significant anisocytosis in red cell series. This means the problem was not a simple decimal point mistake for example.  Most likely we are dealing with a mislabeled smear-- the blood reported as WBC 0.7 on 03/02/2019 must be a different patient's.  I have discussed this with the lab Nelwyn Salisbury) and she will review and initiate a safety zone portal for future reference.  Will sign off at this point.  Lowella Dell, MD Medical Oncology and Hematology [facility] [address] [place], [place] [address] Tel. [phone]    Fax. [phone]

## 2019-03-02 NOTE — Progress Notes (Addendum)
Internal Medicine Attending Note:  I have seen and evaluated this patient and I have discussed the plan of care with the house staff. Please see their note for complete details. I concur with their findings.  Patient is a 64 yo F admitted with E coli bacteremia 2/2 to urosepsis. Her antibiotics were appropriately narrowed to ceftriaxone based on culture sensitive which resulted yesterday. Unfortunately patient failed to improved and decompensated this morning with AMS, fever to 103F, and worsening tachycardia. STAT CT abdomen revealed a 10 mm obstructing stone at the right ureteropelvic junction with evidence of pyelonephritis. Urology was called and she was taken urgently to the OR for stent placement. Unfortunately, she has developed hypotension post operatively requiring phenylephrine unresponsive to fluids thus far.   When evaluated in the PACU, patient is sleeping comfortable but awakens to touch. BP with MAP of 61 on 40 mcg of neo. Per RN, has been unable to wean off further. Will give an additional 1L bolus and ask PCCM to evaluate for possible transfer to the ICU.  Lastly, patient was found to have severe neutropenia on CBC this morning, new since last checked yesterday. I suspect this is drug induced in the setting of her multiple antibiotic exposure. Cefepime is currently ordered. Dr. Dortha Schwalbe has discussed briefly with ID who recommended switching to ancef. But also would greatly appreciate heme onc input into antibiotic recommendations. We are rechecking a CBC w/ diff this evening.   ADDENDUM: Received call from PACU RN that patient has now been weaned off neo and remains normotensive. Will hold off on PCCM consult, continue with maintenance fluids. Transfer back to 3W when able.   Reymundo Poll, MD 03/02/2019, 4:13 PM

## 2019-03-03 ENCOUNTER — Encounter: Payer: Self-pay | Admitting: *Deleted

## 2019-03-03 DIAGNOSIS — E109 Type 1 diabetes mellitus without complications: Secondary | ICD-10-CM

## 2019-03-03 DIAGNOSIS — I959 Hypotension, unspecified: Secondary | ICD-10-CM

## 2019-03-03 DIAGNOSIS — N209 Urinary calculus, unspecified: Secondary | ICD-10-CM

## 2019-03-03 DIAGNOSIS — F419 Anxiety disorder, unspecified: Secondary | ICD-10-CM

## 2019-03-03 DIAGNOSIS — F411 Generalized anxiety disorder: Secondary | ICD-10-CM

## 2019-03-03 DIAGNOSIS — Z960 Presence of urogenital implants: Secondary | ICD-10-CM

## 2019-03-03 DIAGNOSIS — R52 Pain, unspecified: Secondary | ICD-10-CM

## 2019-03-03 LAB — CBC WITH DIFFERENTIAL/PLATELET
Abs Immature Granulocytes: 0.01 10*3/uL (ref 0.00–0.07)
Abs Immature Granulocytes: 0.04 10*3/uL (ref 0.00–0.07)
Abs Immature Granulocytes: 0.05 10*3/uL (ref 0.00–0.07)
Basophils Absolute: 0 10*3/uL (ref 0.0–0.1)
Basophils Absolute: 0 10*3/uL (ref 0.0–0.1)
Basophils Absolute: 0 10*3/uL (ref 0.0–0.1)
Basophils Relative: 2 %
Basophils Relative: 0 %
Basophils Relative: 0 %
Eosinophils Absolute: 0 10*3/uL (ref 0.0–0.5)
Eosinophils Absolute: 0.1 10*3/uL (ref 0.0–0.5)
Eosinophils Absolute: 0.1 10*3/uL (ref 0.0–0.5)
Eosinophils Relative: 2 %
Eosinophils Relative: 2 %
Eosinophils Relative: 1 %
HCT: 31.8 % — ABNORMAL LOW (ref 36.0–46.0)
HCT: 23.8 % — ABNORMAL LOW (ref 36.0–46.0)
HCT: 24.3 % — ABNORMAL LOW (ref 36.0–46.0)
Hemoglobin: 9.9 g/dL — ABNORMAL LOW (ref 12.0–15.0)
Hemoglobin: 7.3 g/dL — ABNORMAL LOW (ref 12.0–15.0)
Hemoglobin: 7.6 g/dL — ABNORMAL LOW (ref 12.0–15.0)
Immature Granulocytes: 2 %
Immature Granulocytes: 1 %
Immature Granulocytes: 1 %
Lymphocytes Relative: 33 %
Lymphocytes Relative: 12 %
Lymphocytes Relative: 13 %
Lymphs Abs: 0.2 10*3/uL — ABNORMAL LOW (ref 0.7–4.0)
Lymphs Abs: 1 10*3/uL (ref 0.7–4.0)
Lymphs Abs: 1 10*3/uL (ref 0.7–4.0)
MCH: 26.1 pg (ref 26.0–34.0)
MCH: 25.9 pg — ABNORMAL LOW (ref 26.0–34.0)
MCH: 26.4 pg (ref 26.0–34.0)
MCHC: 31.1 g/dL (ref 30.0–36.0)
MCHC: 30.7 g/dL (ref 30.0–36.0)
MCHC: 31.3 g/dL (ref 30.0–36.0)
MCV: 83.9 fL (ref 80.0–100.0)
MCV: 84.4 fL (ref 80.0–100.0)
MCV: 84.4 fL (ref 80.0–100.0)
Monocytes Absolute: 0 10*3/uL — ABNORMAL LOW (ref 0.1–1.0)
Monocytes Absolute: 0.6 10*3/uL (ref 0.1–1.0)
Monocytes Absolute: 0.7 10*3/uL (ref 0.1–1.0)
Monocytes Relative: 3 %
Monocytes Relative: 8 %
Monocytes Relative: 8 %
Neutro Abs: 0.4 10*3/uL — ABNORMAL LOW (ref 1.7–7.7)
Neutro Abs: 6.3 10*3/uL (ref 1.7–7.7)
Neutro Abs: 6.1 10*3/uL (ref 1.7–7.7)
Neutrophils Relative %: 58 %
Neutrophils Relative %: 77 %
Neutrophils Relative %: 77 %
Platelets: 136 10*3/uL — ABNORMAL LOW (ref 150–400)
Platelets: 98 10*3/uL — ABNORMAL LOW (ref 150–400)
Platelets: 103 10*3/uL — ABNORMAL LOW (ref 150–400)
RBC: 3.79 MIL/uL — ABNORMAL LOW (ref 3.87–5.11)
RBC: 2.82 MIL/uL — ABNORMAL LOW (ref 3.87–5.11)
RBC: 2.88 MIL/uL — ABNORMAL LOW (ref 3.87–5.11)
RDW: 15.2 % (ref 11.5–15.5)
RDW: 15.7 % — ABNORMAL HIGH (ref 11.5–15.5)
RDW: 15.7 % — ABNORMAL HIGH (ref 11.5–15.5)
WBC: 0.7 10*3/uL — CL (ref 4.0–10.5)
WBC: 8 10*3/uL (ref 4.0–10.5)
WBC: 8 10*3/uL (ref 4.0–10.5)
nRBC: 0 % (ref 0.0–0.2)
nRBC: 0 % (ref 0.0–0.2)
nRBC: 0 % (ref 0.0–0.2)

## 2019-03-03 LAB — GLUCOSE, CAPILLARY
Glucose-Capillary: 80 mg/dL (ref 70–99)
Glucose-Capillary: 65 mg/dL — ABNORMAL LOW (ref 70–99)
Glucose-Capillary: 93 mg/dL (ref 70–99)
Glucose-Capillary: 131 mg/dL — ABNORMAL HIGH (ref 70–99)

## 2019-03-03 LAB — BASIC METABOLIC PANEL
Anion gap: 8 (ref 5–15)
BUN: 15 mg/dL (ref 8–23)
CO2: 25 mmol/L (ref 22–32)
Calcium: 7.5 mg/dL — ABNORMAL LOW (ref 8.9–10.3)
Chloride: 108 mmol/L (ref 98–111)
Creatinine, Ser: 0.83 mg/dL (ref 0.44–1.00)
GFR calc Af Amer: >60 mL/min (ref >60)
GFR calc non Af Amer: >60 mL/min (ref >60)
Glucose, Bld: 111 mg/dL — ABNORMAL HIGH (ref 70–99)
Potassium: 3.7 mmol/L (ref 3.5–5.1)
Sodium: 141 mmol/L (ref 135–145)

## 2019-03-03 LAB — PATHOLOGIST SMEAR REVIEW

## 2019-03-03 MED ORDER — LIDOCAINE 5 % EX PTCH
1.0000 | MEDICATED_PATCH | CUTANEOUS | Status: DC
  Administered 2019-03-03 – 2019-03-04 (×2): 1 via TRANSDERMAL
  Filled 2019-03-03 (×2): qty 1

## 2019-03-03 MED ORDER — NORTRIPTYLINE HCL 25 MG PO CAPS
50.0000 mg | ORAL_CAPSULE | Freq: Every day | ORAL | Status: DC
  Administered 2019-03-03: 50 mg via ORAL
  Filled 2019-03-03 (×2): qty 2

## 2019-03-03 MED ORDER — CHLORHEXIDINE GLUCONATE CLOTH 2 % EX PADS
6.0000 | MEDICATED_PAD | Freq: Every day | CUTANEOUS | Status: DC
  Administered 2019-03-03: 6 via TOPICAL

## 2019-03-03 MED ORDER — HYDROMORPHONE HCL 1 MG/ML IJ SOLN
1.0000 mg | INTRAMUSCULAR | Status: DC | PRN
  Administered 2019-03-03 – 2019-03-04 (×3): 1 mg via INTRAVENOUS
  Filled 2019-03-03 (×3): qty 1

## 2019-03-03 MED ORDER — ONDANSETRON HCL 4 MG/2ML IJ SOLN
4.0000 mg | Freq: Three times a day (TID) | INTRAMUSCULAR | Status: DC | PRN
  Administered 2019-03-03: 4 mg via INTRAVENOUS
  Filled 2019-03-03: qty 2

## 2019-03-03 MED ORDER — SODIUM CHLORIDE 0.9 % IV SOLN: INTRAVENOUS | Status: AC

## 2019-03-03 NOTE — Progress Notes (Signed)
Inpatient Diabetes Program Recommendations  AACE/ADA: New Consensus Statement on Inpatient Glycemic Control (2015)  Target Ranges:  Prepandial:   less than 140 mg/dL      Peak postprandial:   less than 180 mg/dL (1-2 hours)      Critically ill patients:  140 - 180 mg/dL   Lab Results  Component Value Date   GLUCAP 65 (L) 03/03/2019   HGBA1C 5.1 02/27/2019    Review of Glycemic Control Results for TRU, LEOPARD (MRN [id number]) as of 03/03/2019 15:40  Ref. Range 03/02/2019 22:07 03/03/2019 06:23 03/03/2019 11:31  Glucose-Capillary Latest Ref Range: 70 - 99 mg/dL 558 (H) 80 65 (L)  Diabetes history: LADA (treated as Type 1 diabetes) Outpatient Diabetes medications: Lantus 34 units daily, Novolog 10 units with breakfast, 8 units with lunch, 10 units with supper, Metformin 500 mg BID Current orders for Inpatient glycemic control Lantus 20 units daily Novolog moderate tid with meals Inpatient Diabetes Program Recommendations:   Note blood sugars <100 mg/dL.  Consider reducing Lantus to 18 units daily.   Thanks  Beryl Meager, RN, BC-ADM Inpatient Diabetes Coordinator Pager [phone] (8a-5p)

## 2019-03-03 NOTE — Progress Notes (Signed)
Physical Therapy Treatment Patient Details Name: [name] MRN: [id number] DOB: 1955-05-02 Today's Date: 03/03/2019    History of Present Illness Patient presented to the hospital with a 5 day histroy of increasing low back pain, wekaness, and confusion. She was found to have sepsis and a UTI. PMH: HTN, DMII, depression, bi-polar, anxiety     PT Comments    Pt focused on gt training and stair training this session.  She is making functional gains and husband at bed side reports he can take two weeks off from work for her recovery.  Pt continues to benefit from skilled PT during hospitalization.  Informed nursing and case management of patient's d/c needs.     Follow Up Recommendations  Home health PT;Supervision/Assistance - 24 hour     Equipment Recommendations  3in1 (PT)(reports she has a RW at home.)    Recommendations for Other Services Rehab consult     Precautions / Restrictions Precautions Precautions: Fall Restrictions Weight Bearing Restrictions: No    Mobility  Bed Mobility Overal bed mobility: Needs Assistance Bed Mobility: Supine to Sit     Supine to sit: Min guard     General bed mobility comments: Min guard for safety  Transfers Overall transfer level: Needs assistance Equipment used: Rolling walker (2 wheeled);None Transfers: Sit to/from Stand Sit to Stand: Min guard         General transfer comment: Pt required cues for hand placement to and from seated surface.  Ambulation/Gait Ambulation/Gait assistance: Min guard Gait Distance (Feet): 200 Feet Assistive device: Rolling walker (2 wheeled) Gait Pattern/deviations: Decreased step length - right;Decreased step length - left;Trunk flexed;Antalgic;Drifts right/left Gait velocity: decreased   General Gait Details: Pt required assistance to maintain pathway of device and assistance to stay in RW with turns.  Pt slow and guarded and easily distracted.  With head turns and conversation drifting to  the R increased.   Stairs Stairs: Yes Stairs assistance: Min guard Stair Management: Two rails;Forwards;Alternating pattern Number of Stairs: 4 General stair comments: Cues for sequencing and hand placement this session.  Pt slow and guarded with heavy reliance on rails for support.   Wheelchair Mobility    Modified Rankin (Stroke Patients Only)       Balance Overall balance assessment: Needs assistance Sitting-balance support: No upper extremity supported Sitting balance-Leahy Scale: Fair       Standing balance-Leahy Scale: Fair                              Cognition Arousal/Alertness: Awake/alert Behavior During Therapy: Restless;Anxious Overall Cognitive Status: Impaired/Different from baseline Area of Impairment: Attention;Safety/judgement;Awareness;Memory                   Current Attention Level: Selective Memory: Decreased short-term memory Following Commands: Follows one step commands inconsistently Safety/Judgement: Decreased awareness of safety;Decreased awareness of deficits Awareness: Intellectual   General Comments: pt anxious and upset upon arrival that she has not gotten the nausea medicine she requested. Told pt that RN was working on it as OTA had just spoken with RN about issue. pt perseverating on negativity and c/o poor quality of care on unit. Utilized deep breathing strategies and word finding tasks to reorient pt to session in an attempt to decrease anxiety. pt insistent that nothing in her life is good, but by end of session pt appeared more calm.      Exercises      General Comments  Pertinent Vitals/Pain Pain Assessment: 0-10 Pain Score: 7  Pain Location: back Pain Descriptors / Indicators: Discomfort;Constant Pain Intervention(s): Monitored during session;Repositioned    Home Living                      Prior Function            PT Goals (current goals can now be found in the care plan  section) Acute Rehab PT Goals Patient Stated Goal: to get stronger Potential to Achieve Goals: Good Progress towards PT goals: Progressing toward goals    Frequency    Min 3X/week      PT Plan Current plan remains appropriate    Co-evaluation              AM-PAC PT "6 Clicks" Mobility   Outcome Measure  Help needed turning from your back to your side while in a flat bed without using bedrails?: A Little Help needed moving from lying on your back to sitting on the side of a flat bed without using bedrails?: A Little Help needed moving to and from a bed to a chair (including a wheelchair)?: A Little Help needed standing up from a chair using your arms (e.g., wheelchair or bedside chair)?: A Little Help needed to walk in hospital room?: A Little Help needed climbing 3-5 steps with a railing? : A Little 6 Click Score: 18    End of Session Equipment Utilized During Treatment: Gait belt Activity Tolerance: Patient tolerated treatment well Patient left: in chair;with call bell/phone within reach;with chair alarm set Nurse Communication: Mobility status PT Visit Diagnosis: Unsteadiness on feet (R26.81);Other abnormalities of gait and mobility (R26.89);Pain Pain - part of body: (low back)     Time: 6270-3500 PT Time Calculation (min) (ACUTE ONLY): 19 min  Charges:  $Gait Training: 8-22 mins                     Erasmo Leventhal , PTA Acute Rehabilitation Services Pager [phone] Office [phone]     Karcyn Menn Eli Hose 03/03/2019, 3:58 PM

## 2019-03-03 NOTE — Progress Notes (Signed)
Subjective: HD#4  Events overnight: fever and neutropenia resolved  Patient was seen on rounds and states that she has chest pain with palpation of sternum and is not feeling well. She was distressed about her condition and the possibility of a second surgery to remove the stone in her right kidney. She was generally tearful and seemed fearful of pain associated with surgery.   Objective:  Vital signs in last 24 hours: Vitals:   03/02/19 2007 03/02/19 2349 03/03/19 0342 03/03/19 0820  BP: (!) 111/57 (!) 111/58 128/70 135/71  Pulse: 83 82 85 95  Resp: 18 18 19 20   Temp: 98.1 F (36.7 C) 98.2 F (36.8 C) 98.3 F (36.8 C) 98.3 F (36.8 C)  TempSrc: Oral Axillary Axillary Oral  SpO2: 99% 100% 94% 98%  Weight:   81.9 kg   Height:       Weight change:   Intake/Output Summary (Last 24 hours) at 03/03/2019 1140 Last data filed at 03/03/2019 0343 Gross per 24 hour  Intake 2833.75 ml  Output 1575 ml  Net 1258.75 ml    Physical Exam: General: Laying bed. Appears emotionally distressed.  CV: No murmurs rubs or gallops. Normal rate and rhythm. No peripheral edema. Intact distal pulses.  Pulm/chest: No increased work of breathing. Lungs clear to auscultation bilaterally.  Abdominal: Tenderness to palpation. Appears distended.  Neuro: Oriented to person, place HEENT: normocephalic, atraumatic    Pertinent labs/imaging:  BMP Latest Ref Rng & Units 03/03/2019 03/02/2019 03/01/2019  Glucose 70 - 99 mg/dL 03/03/2019) 94 875(I)  BUN 8 - 23 mg/dL 15 17 43(P)  Creatinine 0.44 - 1.00 mg/dL 29(J 1.88) 4.16(S)  BUN/Creat Ratio 6 - 22 (calc) - - -  Sodium 135 - 145 mmol/L 141 140 141  Potassium 3.5 - 5.1 mmol/L 3.7 3.9 3.6  Chloride 98 - 111 mmol/L 108 104 108  CO2 22 - 32 mmol/L 25 25 24   Calcium 8.9 - 10.3 mg/dL 7.5(L) 8.4(L) 7.8(L)   CBC Latest Ref Rng & Units 03/03/2019 03/03/2019 03/02/2019  WBC 4.0 - 10.5 K/uL 8.0 8.0 9.2  Hemoglobin 12.0 - 15.0 g/dL 7.6(L) 7.3(L) 7.6(L)  Hematocrit 36.0 -  46.0 % 24.3(L) 23.8(L) 24.1(L)  Platelets 150 - 400 K/uL 103(L) 98(L) 95(L)      Assessment/Plan:  Principal Problem:   E coli bacteremia Active Problems:   Acute encephalopathy   Acute pyelonephritis   Urinary tract obstruction by kidney stone   Arterial hypotension  Summary:  [name] is a 64 year old female with a pertinent past medical history of type I diabetes, hypertension, and IBS who presented with acute back pain and confusion on 2/26 and was subsequently diagnosed with E. Coli bacteriemia due to a UTI and a 81mm stone in the right uretopelvic junction on hospital day 4.   E. Coli bacteremia, pyelonephritis, urinary tract obstruction by kidney stone: Patient successfully underwent cytoscopy and stent placement yesterday. Persistent fever has resolved and temperature has stabilized around 98 degrees farenheit. Neutropenia resolved as well with neutrophil count at 6.8. Creatinine is down to 0.83 from 1.08 yesterday.  - Continue ancef for 14 days from 03/01/202; IV currently and transition to oral for discharge - Continue IV fluids as needed - Continue monitoring kidney function.  - Schedule for urteroscopy for definitive stone management   Arterial hypotension: Patient has experienced hypotension following surgery yesterday.  -Continue to hold home antihypertensives -Continue IV fluids as necessary    Anxiety: - Start 50mg  nortriptyline  Pain: -increase  hydromorphone to 1mg     Diet: normal  IVF: 0.9% sodium chloride infusion  VTE: enoxaparin  Code: partial  PT/OT recs: home health OT; if has 24hr S otherwise needs rehab/PT PENDING TOC recs: PENDING  Dispo: likely discharge in 1-2 days   LOS: 4 days   Mikael Spray, Medical Student 03/03/2019, 11:40 AM    Attestation for Student Documentation:  I personally was present and performed or re-performed the history, physical exam and medical decision-making activities of this service and have verified  that the service and findings are accurately documented in the student's note.  Marianna Payment, MD 03/03/2019, 2:33 PM

## 2019-03-03 NOTE — Progress Notes (Signed)
Occupational Therapy Treatment Patient Details Name: [name] MRN: [id number] DOB: 1955-06-10 Today's Date: 03/03/2019    History of present illness Patient presented to the hospital with a 5 day histroy of increasing low back pain, wekaness, and confusion. She was found to have sepsis and a UTI. PMH: HTN, DMII, depression, bi-polar, anxiety    OT comments  Pt making steady progress towards OT goals this session. Pt anxious and frsutrated upon OTA arrival upset over not yet receiving nausea medicine ( RN aware and working on it). Pt requires multimodal calming strategies to reorient pt back to session and decrease anxiety. Overall, pt presents with anxiety, depression, and chronic back pain impacting ability to engage in ADLS. Pt requires min guard assist for functional mobility with RW and standing grooming tasks at sink. Pt able to progress functional mobility distance to greater than a household distance with RW min guard assist. Agree with DC plan below however discussed with OTR that pt may benefit from psych consult as pts depression appears to be limpacting pts progression. Will follow acutely per POC and update DC recs as needed.    Follow Up Recommendations  Home health OT;Other (comment)(if has 24 hour S othewise needs rehab)    Equipment Recommendations  Tub/shower seat;Other (comment)(pt aware she has to get one on her own)    Recommendations for Other Services      Precautions / Restrictions Precautions Precautions: Fall Restrictions Weight Bearing Restrictions: No       Mobility Bed Mobility Overal bed mobility: Needs Assistance Bed Mobility: Supine to Sit;Sit to Supine     Supine to sit: Min guard;HOB elevated Sit to supine: Supervision;HOB elevated   General bed mobility comments: min guard for line mgmt and overall safety with HOb elevated and use of rail. pt returned self to supine with sup for safwty  Transfers Overall transfer level: Needs  assistance Equipment used: Rolling walker (2 wheeled);None Transfers: Sit to/from Stand Sit to Stand: Min guard         General transfer comment: pt sit<>stand x2 from EOB with min guard    Balance Overall balance assessment: Needs assistance Sitting-balance support: No upper extremity supported Sitting balance-Leahy Scale: Fair     Standing balance support: No upper extremity supported;During functional activity Standing balance-Leahy Scale: Fair Standing balance comment: able to complete standing grooming tasks with no UE support                           ADL either performed or assessed with clinical judgement   ADL Overall ADL's : Needs assistance/impaired     Grooming: Wash/dry face;Oral care;Brushing hair;Supervision/safety;Standing           Upper Body Dressing : Minimal assistance;Standing Upper Body Dressing Details (indicate cue type and reason): to don sweater in standing; MINA  to navigate around IV     Toilet Transfer: Min guard;Ambulation;RW Toilet Transfer Details (indicate cue type and reason): min guard for safety with RW         Functional mobility during ADLs: Min guard;Rolling walker General ADL Comments: pt presents with anxiety, depression, and chronic back pain impacting ability to engage in ADLS. Pt perseverates on anxiety and requires cues to redirect pt back to session.     Vision       Perception     Praxis      Cognition Arousal/Alertness: Awake/alert Behavior During Therapy: Restless;Anxious Overall Cognitive Status: Impaired/Different from baseline Area of  Impairment: Attention;Safety/judgement;Awareness;Memory                   Current Attention Level: Selective Memory: Decreased short-term memory   Safety/Judgement: Decreased awareness of safety;Decreased awareness of deficits Awareness: Intellectual   General Comments: pt anxious and upset upon arrival that she has not gotten the nausea medicine she  requested. Told pt that RN was working on it as OTA had just spoken with RN about issue. pt perseverating on negativity and c/o poor quality of care on unit. Utilized deep breathing strategies and word finding tasks to reorient pt to session in an attempt to decrease anxiety. pt insistent that nothing in her life is good, but by end of session pt appeared more calm. pt reports liking [employer], played music during session as calming strategy.         Exercises     Shoulder Instructions       General Comments session conducted on RA with O2 sats >90% throughout; HR increase to 106 bpm with mobility    Pertinent Vitals/ Pain       Pain Assessment: 0-10 Pain Score: 7  Pain Location: back Pain Descriptors / Indicators: Discomfort;Constant Pain Intervention(s): Monitored during session  Home Living                                          Prior Functioning/Environment              Frequency  Min 2X/week        Progress Toward Goals  OT Goals(current goals can now be found in the care plan section)  Progress towards OT goals: Progressing toward goals  Acute Rehab OT Goals Patient Stated Goal: to get stronger OT Goal Formulation: With patient Time For Goal Achievement: 03/14/19 Potential to Achieve Goals: Good  Plan Discharge plan remains appropriate    Co-evaluation                 AM-PAC OT "6 Clicks" Daily Activity     Outcome Measure   Help from another person eating meals?: None Help from another person taking care of personal grooming?: A Little Help from another person toileting, which includes using toliet, bedpan, or urinal?: A Little Help from another person bathing (including washing, rinsing, drying)?: A Little Help from another person to put on and taking off regular upper body clothing?: A Little Help from another person to put on and taking off regular lower body clothing?: A Little 6 Click Score: 19    End of Session  Equipment Utilized During Treatment: Rolling walker  OT Visit Diagnosis: Unsteadiness on feet (R26.81);Other abnormalities of gait and mobility (R26.89);Other symptoms and signs involving cognitive function   Activity Tolerance Patient tolerated treatment well   Patient Left in bed;with call bell/phone within reach;with bed alarm set   Nurse Communication Mobility status        Time: 2563-8937 OT Time Calculation (min): 33 min  Charges: OT General Charges $OT Visit: 1 Visit OT Treatments $Self Care/Home Management : 23-37 mins  Lanier Clam., COTA/L Acute Rehabilitation Services [phone]    Ihor Gully 03/03/2019, 9:37 AM

## 2019-03-03 NOTE — Anesthesia Postprocedure Evaluation (Signed)
Anesthesia Post Note  Patient: [name]  Procedure(s) Performed: CYSTOSCOPY WITH STENT PLACEMENT (Right Urethra)     Patient location during evaluation: PACU Anesthesia Type: General Level of consciousness: awake and alert Pain management: pain level controlled Vital Signs Assessment: post-procedure vital signs reviewed and stable Respiratory status: spontaneous breathing, nonlabored ventilation, respiratory function stable and patient connected to nasal cannula oxygen Cardiovascular status: blood pressure returned to baseline and stable Postop Assessment: no apparent nausea or vomiting Anesthetic complications: no Comments: Brought to PACU intubated due to poor respiratory effort.  Eventually awake and strong enough that she was extubated successfully.  Phenylephrine infusion weaned during PACU stay and patient taken back to patient room.    Last Vitals:  Vitals:   03/03/19 0820 03/03/19 1212  BP: 135/71 (!) 125/58  Pulse: 95 89  Resp: 20 19  Temp: 36.8 C 37.2 C  SpO2: 98% 95%    Last Pain:  Vitals:   03/03/19 1212  TempSrc: Oral  PainSc:                  Cecile Hearing

## 2019-03-04 DIAGNOSIS — N10 Acute tubulo-interstitial nephritis: Secondary | ICD-10-CM

## 2019-03-04 DIAGNOSIS — N200 Calculus of kidney: Secondary | ICD-10-CM

## 2019-03-04 DIAGNOSIS — Z888 Allergy status to other drugs, medicaments and biological substances status: Secondary | ICD-10-CM

## 2019-03-04 LAB — CBC
HCT: 27.3 % — ABNORMAL LOW (ref 36.0–46.0)
Hemoglobin: 8.6 g/dL — ABNORMAL LOW (ref 12.0–15.0)
MCH: 26.1 pg (ref 26.0–34.0)
MCHC: 31.5 g/dL (ref 30.0–36.0)
MCV: 82.7 fL (ref 80.0–100.0)
Platelets: 150 10*3/uL (ref 150–400)
RBC: 3.3 MIL/uL — ABNORMAL LOW (ref 3.87–5.11)
RDW: 15.4 % (ref 11.5–15.5)
WBC: 8.3 10*3/uL (ref 4.0–10.5)
nRBC: 0 % (ref 0.0–0.2)

## 2019-03-04 LAB — BASIC METABOLIC PANEL
Anion gap: 12 (ref 5–15)
BUN: 11 mg/dL (ref 8–23)
CO2: 24 mmol/L (ref 22–32)
Calcium: 8.1 mg/dL — ABNORMAL LOW (ref 8.9–10.3)
Chloride: 102 mmol/L (ref 98–111)
Creatinine, Ser: 0.71 mg/dL (ref 0.44–1.00)
GFR calc Af Amer: >60 mL/min (ref >60)
GFR calc non Af Amer: >60 mL/min (ref >60)
Glucose, Bld: 141 mg/dL — ABNORMAL HIGH (ref 70–99)
Potassium: 3.2 mmol/L — ABNORMAL LOW (ref 3.5–5.1)
Sodium: 138 mmol/L (ref 135–145)

## 2019-03-04 LAB — GLUCOSE, CAPILLARY
Glucose-Capillary: 131 mg/dL — ABNORMAL HIGH (ref 70–99)
Glucose-Capillary: 109 mg/dL — ABNORMAL HIGH (ref 70–99)

## 2019-03-04 LAB — HEPARIN INDUCED PLATELET AB (HIT ANTIBODY): Heparin Induced Plt Ab: 0.085 OD (ref 0.000–0.400)

## 2019-03-04 MED ORDER — HYDROCODONE-ACETAMINOPHEN 5-325 MG PO TABS: 1.0000 | ORAL_TABLET | ORAL | 0 refills | Status: AC | PRN

## 2019-03-04 MED ORDER — CEFDINIR 300 MG PO CAPS: 300.0000 mg | ORAL_CAPSULE | Freq: Two times a day (BID) | ORAL | 0 refills | Status: AC

## 2019-03-04 MED ORDER — AMLODIPINE BESYLATE 5 MG PO TABS
5.0000 mg | ORAL_TABLET | Freq: Every day | ORAL | Status: DC
  Administered 2019-03-04: 5 mg via ORAL
  Filled 2019-03-04: qty 1

## 2019-03-04 MED ORDER — HYDROCODONE-ACETAMINOPHEN 5-325 MG PO TABS
1.0000 | ORAL_TABLET | Freq: Three times a day (TID) | ORAL | Status: DC
  Administered 2019-03-04: 1 via ORAL
  Filled 2019-03-04: qty 1

## 2019-03-04 NOTE — Progress Notes (Signed)
Internal Medicine Attending Note:  I have seen and evaluated this patient and I have discussed the plan of care with the house staff. Please see their note for complete details. I concur with their findings.   Reymundo Poll, MD 03/04/2019, 3:44 PM

## 2019-03-04 NOTE — Progress Notes (Addendum)
Physical Therapy Treatment Patient Details Name: [name] MRN: [id number] DOB: 07-Oct-1955 Today's Date: 03/04/2019    History of Present Illness Patient presented to the hospital with a 5 day histroy of increasing low back pain, wekaness, and confusion. She was found to have sepsis and a UTI. PMH: HTN, DMII, depression, bi-polar, anxiety     PT Comments    Pt progressing well towards their physical therapy goals as demonstrated by improved activity tolerance and increased ambulation distance. Pt ambulating 300 feet with a walker at a min guard assist level. Negotiated 2 steps to simulate home set up. Continues with balance impairments and cognitive deficits. Pt husband reports pt cognition has improved and plans to provide 24/7 assist. D/c plan remains appropriate.     Follow Up Recommendations  Home health PT;Supervision/Assistance - 24 hour     Equipment Recommendations  3in1 (PT)    Recommendations for Other Services       Precautions / Restrictions Precautions Precautions: Fall Restrictions Weight Bearing Restrictions: No    Mobility  Bed Mobility               General bed mobility comments: OOB in recliner  Transfers Overall transfer level: Needs assistance Equipment used: Rolling walker (2 wheeled);None Transfers: Sit to/from Stand Sit to Stand: Min guard         General transfer comment: Max cues for hand placement  Ambulation/Gait Ambulation/Gait assistance: Min guard Gait Distance (Feet): 300 Feet Assistive device: Rolling walker (2 wheeled) Gait Pattern/deviations: Decreased stride length;Trunk flexed Gait velocity: decreased   General Gait Details: Cues for upward gaze, walker proximity, larger step length. Pt unable to vary gait speed when cued   Stairs Stairs: Yes Stairs assistance: Min guard Stair Management: Two rails;Forwards;Alternating pattern Number of Stairs: 2     Wheelchair Mobility    Modified Rankin (Stroke Patients  Only)       Balance Overall balance assessment: Needs assistance Sitting-balance support: No upper extremity supported Sitting balance-Leahy Scale: Good     Standing balance support: No upper extremity supported;During functional activity Standing balance-Leahy Scale: Fair                              Cognition Arousal/Alertness: Awake/alert Behavior During Therapy: WFL for tasks assessed/performed Overall Cognitive Status: Impaired/Different from baseline Area of Impairment: Attention;Safety/judgement;Awareness;Memory                   Current Attention Level: Selective Memory: Decreased short-term memory Following Commands: Follows one step commands inconsistently Safety/Judgement: Decreased awareness of safety;Decreased awareness of deficits Awareness: Emergent          Exercises      General Comments        Pertinent Vitals/Pain Pain Assessment: No/denies pain    Home Living                      Prior Function            PT Goals (current goals can now be found in the care plan section) Acute Rehab PT Goals Patient Stated Goal: to get stronger Potential to Achieve Goals: Good Progress towards PT goals: Progressing toward goals    Frequency    Min 3X/week      PT Plan Current plan remains appropriate    Co-evaluation              AM-PAC PT "6 Clicks" Mobility  Outcome Measure  Help needed turning from your back to your side while in a flat bed without using bedrails?: None Help needed moving from lying on your back to sitting on the side of a flat bed without using bedrails?: A Little Help needed moving to and from a bed to a chair (including a wheelchair)?: A Little Help needed standing up from a chair using your arms (e.g., wheelchair or bedside chair)?: A Little Help needed to walk in hospital room?: A Little Help needed climbing 3-5 steps with a railing? : A Little 6 Click Score: 19    End of Session  Equipment Utilized During Treatment: Gait belt Activity Tolerance: Patient tolerated treatment well Patient left: in chair;with call bell/phone within reach;with chair alarm set;with family/visitor present Nurse Communication: Mobility status PT Visit Diagnosis: Unsteadiness on feet (R26.81);Other abnormalities of gait and mobility (R26.89);Pain     Time: 0940-7680 PT Time Calculation (min) (ACUTE ONLY): 22 min  Charges:  $Gait Training: 8-22 mins                       Wyona Almas, PT, DPT Acute Rehabilitation Services Pager [phone] Office [phone]    Deno Etienne 03/04/2019, 5:27 PM

## 2019-03-04 NOTE — Significant Event (Signed)
Patient discharge to home. Prior instructions reviewed with patient and husband, no concerns expressed at time time and all belonging sent with patient. Patient wheeled to front of Hospital with husband.

## 2019-03-04 NOTE — Progress Notes (Signed)
Subjective: HD#5  Events Overnight: no events overnight.  Patient was seen this morning on rounds.  Patient continues to express significant concern about having a second procedure.  She states that she is anxious about having to have a breathing tube placed and going through the pain of the second surgery.  She continues to have superficial chest soreness.  Otherwise she is feeling significantly better since her procedure.  All of patient's questions were thoroughly answered  Objective:  Vital signs in last 24 hours: Vitals:   03/03/19 1212 03/03/19 1557 03/03/19 1938 03/04/19 0420  BP: (!) 125/58 (!) 120/56 (!) 165/73 (!) 156/70  Pulse: 89 93 (!) 105 99  Resp: 19 20 18 20   Temp: 98.9 F (37.2 C) 98 F (36.7 C) 98.6 F (37 C) 98.6 F (37 C)  TempSrc: Oral Oral Oral Oral  SpO2: 95% 95% 95% 95%  Weight:    81.5 kg  Height:        Physical Exam: Physical Exam  Constitutional: She is oriented to person, place, and time. No distress.  HENT:  Head: Normocephalic and atraumatic.  Eyes: EOM are normal.  Cardiovascular: Normal rate and intact distal pulses.  Pulmonary/Chest: Effort normal. No respiratory distress. She exhibits tenderness.  Abdominal: Soft. She exhibits no distension. There is no abdominal tenderness.  Musculoskeletal:        General: No tenderness or edema. Normal range of motion.     Cervical back: Normal range of motion.  Neurological: She is alert and oriented to person, place, and time.  Skin: Skin is warm and dry. She is not diaphoretic.    Filed Weights   03/02/19 1156 03/03/19 0342 03/04/19 0420  Weight: 75.9 kg 81.9 kg 81.5 kg     Intake/Output Summary (Last 24 hours) at 03/04/2019 0714 Last data filed at 03/04/2019 0420 Gross per 24 hour  Intake --  Output 1600 ml  Net -1600 ml    Pertinent labs/Imaging: CBC Latest Ref Rng & Units 03/03/2019 03/03/2019 03/02/2019  WBC 4.0 - 10.5 K/uL 8.0 8.0 9.2  Hemoglobin 12.0 - 15.0 g/dL 7.6(L) 7.3(L) 7.6(L)   Hematocrit 36.0 - 46.0 % 24.3(L) 23.8(L) 24.1(L)  Platelets 150 - 400 K/uL 103(L) 98(L) 95(L)    CMP Latest Ref Rng & Units 03/03/2019 03/02/2019 03/01/2019  Glucose 70 - 99 mg/dL 03/03/2019) 94 259(D)  BUN 8 - 23 mg/dL 15 17 63(O)  Creatinine 0.44 - 1.00 mg/dL 75(I 4.33) 2.95(J)  Sodium 135 - 145 mmol/L 141 140 141  Potassium 3.5 - 5.1 mmol/L 3.7 3.9 3.6  Chloride 98 - 111 mmol/L 108 104 108  CO2 22 - 32 mmol/L 25 25 24   Calcium 8.9 - 10.3 mg/dL 7.5(L) 8.4(L) 7.8(L)  Total Protein 6.5 - 8.1 g/dL - - -  Total Bilirubin 0.3 - 1.2 mg/dL - - -  Alkaline Phos 38 - 126 U/L - - -  AST 15 - 41 U/L - - -  ALT 0 - 44 U/L - - -    No results found.  Assessment/Plan:  Principal Problem:   E coli bacteremia Active Problems:   Acute encephalopathy   Acute pyelonephritis   Urinary tract obstruction by kidney stone   Arterial hypotension   Anxiety state    Patient Summary: [name] is a 64 y.o. with pertinent PMH of  admit for type 1 diabetes, hypertension, IBS, who presented with acute back pain and confusion and admitted for E. coli bacteremia secondary to pyelonephritis with 10 mm  stone in the right UPJ on hospital day 5  #E. coli bacteremia #Pyelonephritis #Nephrolithiasis Medically improved since having stent placed for hydronephrosis secondary to 10 mm nephrolithiasis in the right UPJ.  Patient has not had fevers since the procedure and is tolerating the IV antibiotics well.  Patient is stable for discharge at this time and will need an additional 10 days of oral antibiotics with close follow-up with urology for further evaluation and management of her nephrolithiasis.  Charged on a first or second generation cephalosporin.  Anxiety: Patient is tolerating the nortriptyline well without any side effects.  She will need to follow-up with her primary care physician to titrate this medication as needed.  Diet: Normal IVF: None VTE: Enoxaparin Code: Partial PT/OT recs: home health  PT/OT TOC recs: consulted for PCP   Dispo: Anticipated discharge today.    [name], D.O. [facility], PGY-1 Date 03/04/2019 Time 7:14 AM

## 2019-03-04 NOTE — Progress Notes (Signed)
Inpatient Rehabilitation-Admissions Coordinator   Noted PT and OT recommendations are for Home with [facility] therapy and that pt has assistance from her husband at DC. AC will no longer follow or pursue CIR consult.   Cheri Rous, OTR/L  Rehab Admissions Coordinator  [phone] 03/04/2019 9:17 AM

## 2019-03-04 NOTE — Discharge Summary (Signed)
Name: [name] MRN: [id number] DOB: [birth year] 64 y.o. PCP: Elfredia Nevins, MD  Date of Admission: 02/27/2019  8:57 AM Date of Discharge: 3/3/202103/03/2019 Attending Physician: No att. providers found  Discharge Diagnosis: 1. Urosepsis 2/2 E. Coli bacteremia in the setting of obstructing nephrolithiasis.    Discharge Medications: Allergies as of 03/04/2019      Reactions   Abilify [aripiprazole]    Patient is intolerant   Reglan [metoclopramide] Other (See Comments)   Chest pains      Medication List    TAKE these medications   acetaminophen 500 MG tablet Commonly known as: TYLENOL Take 1,000 mg by mouth every 6 (six) hours as needed for mild pain or headache.   ALPRAZolam 1 MG tablet Commonly known as: XANAX Take 1 mg by mouth 4 (four) times daily as needed for anxiety.   AMBULATORY NON FORMULARY MEDICATION Squatty Potty x 1   amLODipine 5 MG tablet Commonly known as: NORVASC Take 5 mg by mouth daily.   BD Pen Needle Nano U/F 32G X 4 MM Misc Generic drug: Insulin Pen Needle USE WITH LANTUS AND NOVOLOG PENS   Benicar 40 MG tablet Generic drug: olmesartan Take 40 mg by mouth daily.   cefdinir 300 MG capsule Commonly known as: OMNICEF Take 1 capsule (300 mg total) by mouth 2 (two) times daily for 10 days.   cholecalciferol 25 MCG (1000 UNIT) tablet Commonly known as: VITAMIN D3 Take 1,000 Units by mouth daily.   desipramine 25 MG tablet Commonly known as: Norpramin 3  qam   freestyle lancets USE TO TEST BLOOD SUGAR 4 TO 6 TIMES DAILY AS INSTRUCTED   FREESTYLE LITE test strip Generic drug: glucose blood USE TO CHECK BLOOD SUGAR 6 TIMES PER DAY   gabapentin 800 MG tablet Commonly known as: NEURONTIN Take 800 mg by mouth 3 (three) times daily.   glucagon 1 MG injection Inject 1 mg into the muscle once as needed.   HYDROcodone-acetaminophen 5-325 MG tablet Commonly known as: NORCO/VICODIN Take 1 tablet by mouth every 4 (four) hours as needed  for up to 5 days for moderate pain.   Insulin Syringes (Disposable) U-100 1 ML Misc To use for insulin injection..   Lantus SoloStar 100 UNIT/ML Solostar Pen Generic drug: insulin glargine Inject 34 Units into the skin daily.   meclizine 25 MG tablet Commonly known as: ANTIVERT Take 25 mg by mouth 4 (four) times daily as needed for dizziness.   metFORMIN 500 MG tablet Commonly known as: GLUCOPHAGE Take 1 tablet (500 mg total) by mouth 2 (two) times daily with a meal.   nortriptyline 50 MG capsule Commonly known as: PAMELOR Take 50 mg by mouth at bedtime.   NovoLOG FlexPen 100 UNIT/ML FlexPen Generic drug: insulin aspart Inject 10 Units into the skin daily after breakfast AND 8 Units daily before lunch AND 10 Units daily before supper. Max daily 40 units.   omeprazole 40 MG capsule Commonly known as: PRILOSEC Take 1 capsule (40 mg total) by mouth 2 (two) times daily. What changed: when to take this   ondansetron 4 MG disintegrating tablet Commonly known as: ZOFRAN-ODT PLACE 1 TO 2 TABLETS ON TONGUE EVERY 4 TO 6 HOURS AS NEEDED FOR NAUSEA   Oscimin 0.125 MG Subl Generic drug: Hyoscyamine Sulfate SL DISSOLVE 1 TABLET UNDER THE TONGUE EVERY 4 HOURS AS NEEDED (NEED APPOINTMENT) What changed: See the new instructions.   Probiotic Caps Take 1 capsule by mouth daily.   simethicone 80 MG  chewable tablet Commonly known as: MYLICON Chew 80 mg by mouth every 6 (six) hours as needed for flatulence.   traZODone 100 MG tablet Commonly known as: DESYREL 1  qhs  May increse to 2  qhs  After 1 week What changed:   how much to take  how to take this  when to take this   Trulance 3 MG Tabs Generic drug: Plecanatide Take 3 mg by mouth daily.            Durable Medical Equipment  (From admission, onward)         Start     Ordered   03/04/19 0000  For home use only DME 3 n 1     03/04/19 1203          Disposition and follow-up:   Ms.Tyeisha L Follmer was  discharged from [facility] in stable condition.  At the hospital follow up visit please address:  1.  Follow up: nephrolithiasis   2.  Labs / imaging needed at time of follow-up: BMP  3.  Pending labs/ test needing follow-up: none  Follow-up Appointments: Follow-up Information    [facility], [facility] Follow up.   Specialty: Home Health Services Why: The home health agency will contact you for the first home visit. Contact information: [address] [place] [address] [phone]           Hospital Course by problem list: 1. Urosepsis, acute encephalopathy Patient presented to the ED on 02/26 with sepsis and a temperature of 102, hypotension at 85/54, lactic acid 5.2 and tachycardia with altered mental status. She was started on vancomycin 1500mg  IV Q48H, cefepime 2gm IV Q12H, 500mg  metronidazole IV while in ED. 02/27 blood cultures were done and revealed E. Coli bacteremia, antibiotics were appropriately narrowed based on sensitivity and patient was switched to 2g ceftriaxone every 24 hours. However, patient failed to improve and fever continued 02/27-03/01with worsening tachycardia despite IV antibiotics. CT scan was done with results consistent for nephrolithiasis with resulting pyelonephritis. Antibiotic de-escalated to cefazolin. Urology was consulted and patient was taken to the OR for stent placement. Patient had hypotension post procedure and required phenylephrine and fluids to stablize. Overnight, following surgery, patient's fever resolved and she continued to improve clinically on 03/02. Once stable, the patient was switched to oral omnicef 300mg , and discharged on an 11 day course. Patient will need to follow up with OP Urology.   2. AKI Patient's baseline creatinine was 0.82, but on admission creatinine was 1.58 and increased to 1.83 on 02/27. Urine studies revealed prerenal-intrinsic FeNA of 1.0% with urine sodium of 60, consistent with  intra-renal injury, likely a result of pyelonephritis and obstruction from stone. Creatinine trended down 02/28-03/01, normalizing at 0.83 on 03/02. As of 03/03 creatinine has continued to downtrend as latest labs show 0.71.   3. Diabetes  On admission patient was hyperglycemic (initial glucose 328 mg/dl) and hypokalemic and started on two 1000 mL boluses of lactated ringers and given 33mEq potassium-chloride in the ED.  On admission she was started on Lantus 20 U/day + sliding scale insulin for continued diabetes control.  On 02/28 patient experienced a hypoglycemic event and was administered dextrose 50%, which resolved the hypoglycemia.  On 03/02 hypoglycemic event and lantus was deceased to 18 units/day, which resolved hypoglycemia and patients glucose remains at 131 mg/dl since.   4. Anxiety  Patient started back on home nortriptyline.    Discharge Vitals:   BP Marland Kitchen)  150/77 (BP Location: Right Arm)   Pulse 92   Temp (!) 97.4 F (36.3 C) (Oral)   Resp 20   Ht 5\' 4"  (1.626 m)   Wt 81.5 kg   SpO2 94%   BMI 30.84 kg/m   Pertinent Labs, Studies, and Procedures:  CBC Latest Ref Rng & Units 03/04/2019 03/03/2019 03/03/2019  WBC 4.0 - 10.5 K/uL 8.3 8.0 8.0  Hemoglobin 12.0 - 15.0 g/dL 05/03/2019) 7.6(L) 7.3(L)  Hematocrit 36.0 - 46.0 % 27.3(L) 24.3(L) 23.8(L)  Platelets 150 - 400 K/uL 150 103(L) 98(L)   CMP Latest Ref Rng & Units 03/04/2019 03/03/2019 03/02/2019  Glucose 70 - 99 mg/dL 05/02/2019) 038(U) 94  BUN 8 - 23 mg/dL 11 15 17   Creatinine 0.44 - 1.00 mg/dL 828(M 0.34)  Sodium 135 - 145 mmol/L 138 141 140  Potassium 3.5 - 5.1 mmol/L 3.2(L) 3.7 3.9  Chloride 98 - 111 mmol/L 102 108 104  CO2 22 - 32 mmol/L 24 25 25   Calcium 8.9 - 10.3 mg/dL 8.1(L) 7.5(L) 8.4(L)  Total Protein 6.5 - 8.1 g/dL - - -  Total Bilirubin 0.3 - 1.2 mg/dL - - -  Alkaline Phos 38 - 126 U/L - - -  AST 15 - 41 U/L - - -  ALT 0 - 44 U/L - - -   CT abdomen with contrast: 1. 10 mm stone obstructing the right  ureteropelvic junction. 2. Focal area of decreased perfusion in the posterior aspect of the lower pole of the right kidney consistent with pyelonephritis. 3. Aortic Atherosclerosis (ICD10-I70.0).  DG retrograde pyelogram: single fluoroscopic spot image demonstrates proximal end of ureteral stent projecting in the contrast-enhanced right renal collecting system   Discharge Instructions: Discharge Instructions    Diet - low sodium heart healthy   Complete by: As directed    Discharge instructions   Complete by: As directed    Ms. Trettin,  It was a pleasure taking care of you here in the hospital.  You were admitted because of an infection in your kidney and a kidney stone.  You were taken to the operating room to put a stent inside the tube inside your kidneys and this helped with your infection.  You were also treated with IV antibiotics.  You will be discharged with an oral antibiotic called cefdinir.  You take 1 pill twice a day for 10 more days.  He can pick this up at your pharmacy.  I would also like for you to follow-up with the urologist to discuss management of your kidney stone.  Please follow-up with your primary doctor as well.  You are scheduled to see  [facility] [id number] 9.15(A [place], [place] Sherian Maroon 03/17/2019 at 12;45 pm  Take care!   For home use only DME 3 n 1   Complete by: As directed    Increase activity slowly   Complete by: As directed       Signed: [place], MD 03/08/2019, 12:25 PM   Pager: @[phone] @

## 2019-03-04 NOTE — TOC Transition Note (Signed)
Transition of Care [facility]) - CM/SW Discharge Note   Patient Details  Name: [name] MRN: [id number] Date of Birth: 1955-04-15  [facility] CM/SW Contact:  Kermit Balo, RN Phone Number: 03/04/2019, 11:59 AM   Clinical Narrative:    Pt discharging home with [facility] services through [place]. Cory with [facility] aware of d/c home today.  3 in 1 to be delivered to the room per [employer].  No issues with medications at home.  Spouse can provide 24 hour supervision at home and transportation to home.   Final next level of care: Home w Home Health Services Barriers to Discharge: No Barriers Identified   Patient Goals and CMS Choice   CMS Medicare.gov Compare Post Acute Care list provided to:: Patient Choice offered to / list presented to : Spouse, Patient  Discharge Placement                       Discharge Plan and Services                DME Arranged: 3-N-1 DME Agency: [employer] Date DME Agency Contacted: 03/04/19   Representative spoke with at DME Agency: Zack HH Arranged: PT, OT HH Agency: [facility] Date [facility] Agency Contacted: 03/04/19   Representative spoke with at [facility] Agency: Kandee Keen  Social Determinants of Health (SDOH) Interventions     Readmission Risk Interventions No flowsheet data found.

## 2019-03-06 DIAGNOSIS — N179 Acute kidney failure, unspecified: Secondary | ICD-10-CM | POA: Diagnosis not present

## 2019-03-06 DIAGNOSIS — Z87440 Personal history of urinary (tract) infections: Secondary | ICD-10-CM | POA: Diagnosis not present

## 2019-03-06 DIAGNOSIS — I700 Atherosclerosis of aorta: Secondary | ICD-10-CM | POA: Diagnosis not present

## 2019-03-06 DIAGNOSIS — Z9181 History of falling: Secondary | ICD-10-CM | POA: Diagnosis not present

## 2019-03-06 DIAGNOSIS — G9340 Encephalopathy, unspecified: Secondary | ICD-10-CM | POA: Diagnosis not present

## 2019-03-06 DIAGNOSIS — N12 Tubulo-interstitial nephritis, not specified as acute or chronic: Secondary | ICD-10-CM | POA: Diagnosis not present

## 2019-03-06 DIAGNOSIS — F419 Anxiety disorder, unspecified: Secondary | ICD-10-CM | POA: Diagnosis not present

## 2019-03-06 DIAGNOSIS — K219 Gastro-esophageal reflux disease without esophagitis: Secondary | ICD-10-CM | POA: Diagnosis not present

## 2019-03-06 DIAGNOSIS — E785 Hyperlipidemia, unspecified: Secondary | ICD-10-CM | POA: Diagnosis not present

## 2019-03-06 DIAGNOSIS — K589 Irritable bowel syndrome without diarrhea: Secondary | ICD-10-CM | POA: Diagnosis not present

## 2019-03-06 DIAGNOSIS — A4151 Sepsis due to Escherichia coli [E. coli]: Secondary | ICD-10-CM | POA: Diagnosis not present

## 2019-03-06 DIAGNOSIS — Z960 Presence of urogenital implants: Secondary | ICD-10-CM | POA: Diagnosis not present

## 2019-03-06 DIAGNOSIS — N200 Calculus of kidney: Secondary | ICD-10-CM | POA: Diagnosis not present

## 2019-03-06 DIAGNOSIS — F319 Bipolar disorder, unspecified: Secondary | ICD-10-CM | POA: Diagnosis not present

## 2019-03-06 DIAGNOSIS — E1142 Type 2 diabetes mellitus with diabetic polyneuropathy: Secondary | ICD-10-CM | POA: Diagnosis not present

## 2019-03-06 DIAGNOSIS — N390 Urinary tract infection, site not specified: Secondary | ICD-10-CM | POA: Diagnosis not present

## 2019-03-06 DIAGNOSIS — Z794 Long term (current) use of insulin: Secondary | ICD-10-CM | POA: Diagnosis not present

## 2019-03-06 DIAGNOSIS — I10 Essential (primary) hypertension: Secondary | ICD-10-CM | POA: Diagnosis not present

## 2019-03-07 LAB — CULTURE, BLOOD (ROUTINE X 2)
Culture: NO GROWTH
Culture: NO GROWTH

## 2019-03-09 DIAGNOSIS — A4151 Sepsis due to Escherichia coli [E. coli]: Secondary | ICD-10-CM | POA: Diagnosis not present

## 2019-03-09 DIAGNOSIS — I10 Essential (primary) hypertension: Secondary | ICD-10-CM | POA: Diagnosis not present

## 2019-03-09 DIAGNOSIS — N12 Tubulo-interstitial nephritis, not specified as acute or chronic: Secondary | ICD-10-CM | POA: Diagnosis not present

## 2019-03-09 DIAGNOSIS — N179 Acute kidney failure, unspecified: Secondary | ICD-10-CM | POA: Diagnosis not present

## 2019-03-09 DIAGNOSIS — N200 Calculus of kidney: Secondary | ICD-10-CM | POA: Diagnosis not present

## 2019-03-09 DIAGNOSIS — N390 Urinary tract infection, site not specified: Secondary | ICD-10-CM | POA: Diagnosis not present

## 2019-03-10 ENCOUNTER — Other Ambulatory Visit: Payer: Self-pay | Admitting: Physician Assistant

## 2019-03-10 DIAGNOSIS — A4151 Sepsis due to Escherichia coli [E. coli]: Secondary | ICD-10-CM | POA: Diagnosis not present

## 2019-03-10 DIAGNOSIS — N200 Calculus of kidney: Secondary | ICD-10-CM | POA: Diagnosis not present

## 2019-03-10 DIAGNOSIS — N179 Acute kidney failure, unspecified: Secondary | ICD-10-CM | POA: Diagnosis not present

## 2019-03-10 DIAGNOSIS — I10 Essential (primary) hypertension: Secondary | ICD-10-CM | POA: Diagnosis not present

## 2019-03-10 DIAGNOSIS — N12 Tubulo-interstitial nephritis, not specified as acute or chronic: Secondary | ICD-10-CM | POA: Diagnosis not present

## 2019-03-10 DIAGNOSIS — N390 Urinary tract infection, site not specified: Secondary | ICD-10-CM | POA: Diagnosis not present

## 2019-03-11 DIAGNOSIS — N390 Urinary tract infection, site not specified: Secondary | ICD-10-CM | POA: Diagnosis not present

## 2019-03-11 DIAGNOSIS — I10 Essential (primary) hypertension: Secondary | ICD-10-CM | POA: Diagnosis not present

## 2019-03-11 DIAGNOSIS — N12 Tubulo-interstitial nephritis, not specified as acute or chronic: Secondary | ICD-10-CM | POA: Diagnosis not present

## 2019-03-11 DIAGNOSIS — N179 Acute kidney failure, unspecified: Secondary | ICD-10-CM | POA: Diagnosis not present

## 2019-03-11 DIAGNOSIS — N200 Calculus of kidney: Secondary | ICD-10-CM | POA: Diagnosis not present

## 2019-03-11 DIAGNOSIS — A4151 Sepsis due to Escherichia coli [E. coli]: Secondary | ICD-10-CM | POA: Diagnosis not present

## 2019-03-16 ENCOUNTER — Other Ambulatory Visit: Payer: Self-pay | Admitting: Urology

## 2019-03-16 DIAGNOSIS — N201 Calculus of ureter: Secondary | ICD-10-CM | POA: Diagnosis not present

## 2019-03-18 ENCOUNTER — Other Ambulatory Visit: Payer: Self-pay

## 2019-03-18 DIAGNOSIS — N179 Acute kidney failure, unspecified: Secondary | ICD-10-CM | POA: Diagnosis not present

## 2019-03-18 DIAGNOSIS — A4151 Sepsis due to Escherichia coli [E. coli]: Secondary | ICD-10-CM | POA: Diagnosis not present

## 2019-03-18 DIAGNOSIS — N12 Tubulo-interstitial nephritis, not specified as acute or chronic: Secondary | ICD-10-CM | POA: Diagnosis not present

## 2019-03-18 DIAGNOSIS — N200 Calculus of kidney: Secondary | ICD-10-CM | POA: Diagnosis not present

## 2019-03-18 DIAGNOSIS — N390 Urinary tract infection, site not specified: Secondary | ICD-10-CM | POA: Diagnosis not present

## 2019-03-18 DIAGNOSIS — I10 Essential (primary) hypertension: Secondary | ICD-10-CM | POA: Diagnosis not present

## 2019-03-20 ENCOUNTER — Encounter: Payer: Self-pay | Admitting: Internal Medicine

## 2019-03-20 ENCOUNTER — Ambulatory Visit (INDEPENDENT_AMBULATORY_CARE_PROVIDER_SITE_OTHER): Payer: Medicare Other | Admitting: Internal Medicine

## 2019-03-20 ENCOUNTER — Other Ambulatory Visit: Payer: Self-pay

## 2019-03-20 ENCOUNTER — Other Ambulatory Visit: Payer: Self-pay | Admitting: Internal Medicine

## 2019-03-20 VITALS — BP 140/86 | HR 90 | Ht 64.0 in | Wt 169.0 lb

## 2019-03-20 DIAGNOSIS — E139 Other specified diabetes mellitus without complications: Secondary | ICD-10-CM | POA: Diagnosis not present

## 2019-03-20 DIAGNOSIS — G63 Polyneuropathy in diseases classified elsewhere: Secondary | ICD-10-CM

## 2019-03-20 DIAGNOSIS — E785 Hyperlipidemia, unspecified: Secondary | ICD-10-CM | POA: Diagnosis not present

## 2019-03-20 NOTE — Progress Notes (Signed)
Patient ID: [name], female   DOB: 11-06-1955, 64 y.o.   MRN: [id number]  This visit occurred during the SARS-CoV-2 public health emergency.  Safety protocols were in place, including screening questions prior to the visit, additional usage of staff PPE, and extensive cleaning of exam room while observing appropriate contact time as indicated for disinfecting solutions.   HPI: [name] is a 64 y.o.-year-old female, initially referred by her PCP, Dr. Assunta Found (PA: Lenise Herald), returning for f/u for LADA, dx 2005, insulin-dependent since ~2006, controlled, with complications (PN, gastroparesis?).  Last visit 3 months ago.  Her diabetes management is limited by her anxiety and panic attacks.  Sugars are usually very high when she is stressed.  She also has GI issues and sees gastroenterology.    Since last visit with sepsis (E. coli bacteremia) due to obstructive nephrolithiasis on 02/27/2019.  This is now resolved.  His sugars have improved afterwards.  Reviewed history She was on an Omnipod insulin pump on 08/01/2015. She had severe anxiety and depression and got so overwhelmed with the insulin pump that we had to take her off the pump.   We switched her to a basal-bolus insulin regimen, but she was unable to calculate the insulin doses based on insulin to carb ratio, so now she is using fixed rapid acting insulin doses.  Reviewed previous HbA1c levels: Lab Results  Component Value Date   HGBA1C 5.1 02/27/2019   HGBA1C 6.7 (A) 11/05/2018   HGBA1C 6.6 (A) 08/15/2018  03/04/2018:  Hba1c calculated from fructosamine is the best in a long time, at 6.84%! 12/19/2017: HbA1c calculated from fructosamine has improved to 7.1%! 09/12/2017: HbA1c  Calculated from the fructosamine is 7.5%, improved from before. 05/07/2017: HbA1c calculated from the fructosamine was 7.7% 02/07/2017: HbA1c calculated from the fructosamine was higher, at 7.7%. 11/07/2016: HbA1c calc. from the fructosamine is  a little lower, at 7.25% 08/07/2016: HbA1c calculated from the fructosamine is 7.5% 05/31/2016: HbA1c calculated from fructosamine is better, at 7.25% 12/15/2015: HbA1c calculated from the fructosamine is higher, 8.4%. 08/24/2015:  HbA1c calculated from fructosamine is 7.4%. 05/24/2015: HbA1c calculated from fructosamine is 6.9%.  She had a professional CGM in 01/2019 which I analyzed - reviewed note: [employer] professional CGM traces between 12/31/2018 at 01/11/2019 were analyzed and the reports will be scanned.  Patient's sugars are variable but consistent patterns were noticed: Sugars are low in the second half of the night and morning, but they increase significantly after breakfast.  Around lunchtime she starts to drop her sugars and they increase but in the less significant fashion after dinner.  Patient was advised to: - reduce the dose of Lantus to 30 units for now.  - If the sugars after breakfast remain high after reducing the Lantus,  increase the dose of rapid acting insulin with breakfast by 2 units. - inject approximately 15 minutes before the meal to see if this helps reducing the sugars after breakfast. -  One of her concerns was about what to do if she is not hungry at lunch.  Reducing the Lantus will help her not drop even if she is not eating, however, if she does feel like she is dropping she may need glucose tablets, 2-4 to keep her sugars up.  She is on: - Metformin 1000 mg with dinner - Lantus  34 >> 30 units at bedtime - NovoLog:  10-12 units in am 8-10 units with lunch 10-12 units with dinner - Novolog Sliding scale: 201-250: +  2 units 251-300: + 3 units 301-350: + 4 units >350: + 5 units  Pt checks her sugars more than 4 times a day: - am: 117-159 >> 55, 183-399 >> 50, 65-249 >> 129-270 - 2h after b'fast: 342 x1 >> n/c >> 211 >> 227, 346 >> 32, 267 >> 57, 157 - before lunch: 71, 109-306, 408 >> 55, 96-220, 267, 313 >> 35, 83-179 - 2h after lunch:  128  >> n/c >> 63, 121 >> n/c >> 94 - before dinner:47, 89-250, 350, 361 >> 71-257, 269 >> 60-180, 270 - 2h after dinner: 211-365 >> 162 >> n/c >> 211 >> 49 >> n/c - bedtime:  <200 >> 31, 34, 91-344 >> 52-259 >> 102-250 - at night: 116 >> 82, 250 >> 90 >> n/c >> 165 >> n/c Lowest sugar was 41 >> 100 >> 31 >> 32 >> 35 (overcorrection); she has hypoglycemia awareness in the 50s. Highest sugar was  210 >> 408 >> 313 x1 >> 270.  No CKD, last BUN/creatinine:  Lab Results  Component Value Date   BUN 11 03/04/2019   CREATININE 0.71 03/04/2019   Normal ACR: Lab Results  Component Value Date   MICRALBCREAT 5.4 11/07/2016    + HL; lipids: Lab Results  Component Value Date   CHOL 82 02/28/2019   HDL 22 (L) 02/28/2019   LDLCALC 39 02/28/2019   TRIG 105 02/28/2019   CHOLHDL 3.7 02/28/2019  She is not on a statin.  -Last eye exam was in 11/2017: + Small cataract, no DR -[facility]: Dr. Janne Lab. Next 06/03/2019.  -+ Numbness and tingling in her feet.  + Occasional pain.  On Neurontin. She sees a podiatrist [facility]).  She had 2 toenails removed in the past due to fungal infection.  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 She also has a dx of Parkinson ds.  She also has bipolar disease. Also: GERD, HTN, fibromyalgia >> on Neurontin.  Her latest TSH was normal: Lab Results  Component Value Date   TSH 0.45 12/16/2018   ROS: Constitutional: no weight gain/no weight loss, no fatigue, no subjective hyperthermia, no subjective hypothermia Eyes: no blurry vision, no xerophthalmia ENT: no sore throat, no nodules palpated in neck, no dysphagia, no odynophagia, no hoarseness Cardiovascular: no CP/no SOB/no palpitations/no leg swelling Respiratory: no cough/no SOB/no wheezing Gastrointestinal: no N/no V/no D/no C/no acid reflux Musculoskeletal: no muscle aches/no joint aches Skin: no rashes, no hair loss Neurological: no tremors/no numbness/no tingling/no dizziness  I  reviewed pt's medications, allergies, PMH, social hx, family hx, and changes were documented in the history of present illness. Otherwise, unchanged from my initial visit note.  Past Medical History:  Diagnosis Date  . Anxiety   . Bipolar 1 disorder (HCC)   . Breast density 06/25/2012   Right breast density, will get mammogram and [place]  . Depression   . Diabetes mellitus without complication (HCC)   . Duodenitis   . HTN (hypertension)   . Hyperlipidemia   . IBS (irritable bowel syndrome)    Past Surgical History:  Procedure Laterality Date  . ABDOMINAL HYSTERECTOMY    . CHOLECYSTECTOMY    . COLONOSCOPY    . CYSTOSCOPY WITH STENT PLACEMENT Right 03/02/2019   Procedure: CYSTOSCOPY WITH STENT PLACEMENT;  Surgeon: Noel Christmas, MD;  Location: [facility] OR;  Service: Urology;  Laterality: Right;   Social History   Socioeconomic History  . Marital status: Married    Spouse name:  phillip  . Number of children: 1  . Years of education: 23  . Highest education level: High school graduate  Occupational History  . Occupation: Disabled  Tobacco Use  . Smoking status: Never Smoker  . Smokeless tobacco: Never Used  Substance and Sexual Activity  . Alcohol use: No    Alcohol/week: 0.0 standard drinks  . Drug use: No  . Sexual activity: Not Currently  Other Topics Concern  . Not on file  Social History Narrative   Lives at home w/ her husband   Left-handed   Caffeine: 1-2 cups of coffee daily   Social Determinants of Health   Financial Resource Strain:   . Difficulty of Paying Living Expenses:   Food Insecurity:   . Worried About Programme researcher, broadcasting/film/video in the Last Year:   . Barista in the Last Year:   Transportation Needs:   . Freight forwarder (Medical):   Marland Kitchen Lack of Transportation (Non-Medical):   Physical Activity:   . Days of Exercise per Week:   . Minutes of Exercise per Session:   Stress:   . Feeling of Stress :   Social Connections:   . Frequency of  Communication with Friends and Family:   . Frequency of Social Gatherings with Friends and Family:   . Attends Religious Services:   . Active Member of Clubs or Organizations:   . Attends Banker Meetings:   Marland Kitchen Marital Status:   Intimate Partner Violence:   . Fear of Current or Ex-Partner:   . Emotionally Abused:   Marland Kitchen Physically Abused:   . Sexually Abused:    Current Outpatient Medications on File Prior to Visit  Medication Sig Dispense Refill  . acetaminophen (TYLENOL) 500 MG tablet Take 1,000 mg by mouth every 6 (six) hours as needed for mild pain or headache.    . ALPRAZolam (XANAX) 1 MG tablet Take 1 mg by mouth 4 (four) times daily as needed for anxiety.     . AMBULATORY NON FORMULARY MEDICATION Squatty Potty x 1 1 each 0  . amLODipine (NORVASC) 5 MG tablet Take 5 mg by mouth daily.    Marland Kitchen BENICAR 40 MG tablet Take 40 mg by mouth daily.     . cholecalciferol (VITAMIN D3) 25 MCG (1000 UNIT) tablet Take 1,000 Units by mouth daily.    Marland Kitchen desipramine (NORPRAMIN) 25 MG tablet 3  qam (Patient not taking: Reported on 02/27/2019) 270 tablet 0  . gabapentin (NEURONTIN) 800 MG tablet Take 800 mg by mouth 3 (three) times daily.     Marland Kitchen glucagon (GLUCAGON EMERGENCY) 1 MG injection Inject 1 mg into the muscle once as needed. 1 each 12  . glucose blood (FREESTYLE LITE) test strip USE TO CHECK BLOOD SUGAR 6 TIMES PER DAY 600 each 2  . insulin aspart (NOVOLOG FLEXPEN) 100 UNIT/ML FlexPen Inject 10 Units into the skin daily after breakfast AND 8 Units daily before lunch AND 10 Units daily before supper. Max daily 40 units. 45 mL 3  . Insulin Glargine (LANTUS SOLOSTAR) 100 UNIT/ML Solostar Pen Inject 34 Units into the skin daily. 30 mL 3  . Insulin Syringes, Disposable, U-100 1 ML MISC To use for insulin injection.. 100 each 1  . Lancets (FREESTYLE) lancets USE TO TEST BLOOD SUGAR 4 TO 6 TIMES DAILY AS INSTRUCTED 300 each 6  . meclizine (ANTIVERT) 25 MG tablet Take 25 mg by mouth 4 (four) times  daily as needed for dizziness.    Marland Kitchen  metFORMIN (GLUCOPHAGE) 500 MG tablet Take 1 tablet (500 mg total) by mouth 2 (two) times daily with a meal. 180 tablet 11  . nortriptyline (PAMELOR) 50 MG capsule Take 50 mg by mouth at bedtime.    Marland Kitchen. omeprazole (PRILOSEC) 40 MG capsule TAKE 1 CAPSULE TWICE A DAY 180 capsule 3  . ondansetron (ZOFRAN-ODT) 4 MG disintegrating tablet PLACE 1 TO 2 TABLETS ON TONGUE EVERY 4 TO 6 HOURS AS NEEDED FOR NAUSEA 180 tablet 4  . OSCIMIN 0.125 MG SUBL DISSOLVE 1 TABLET UNDER THE TONGUE EVERY 4 HOURS AS NEEDED (NEED APPOINTMENT) (Patient taking differently: Place 1 tablet under the tongue every 4 (four) hours as needed (IBS symptoms). ) 360 tablet 5  . Plecanatide (TRULANCE) 3 MG TABS Take 3 mg by mouth daily. 90 tablet 3  . Probiotic CAPS Take 1 capsule by mouth daily. 30 capsule 1  . simethicone (MYLICON) 80 MG chewable tablet Chew 80 mg by mouth every 6 (six) hours as needed for flatulence.    . traZODone (DESYREL) 100 MG tablet 1  qhs  May increse to 2  qhs  After 1 week (Patient taking differently: Take 100 mg by mouth at bedtime. 1  qhs  May increse to 2  qhs  After 1 week) 180 tablet 1   No current facility-administered medications on file prior to visit.   Allergies  Allergen Reactions  . Abilify [Aripiprazole]     Patient is intolerant  . Reglan [Metoclopramide] Other (See Comments)    Chest pains   Family History  Problem Relation Age of Onset  . Hypertension Mother   . Bipolar disorder Mother   . Cancer Mother   . Heart disease Brother   . Thyroid disease Sister   . Hypertension Sister   . Bipolar disorder Sister   . Depression Father   . Cancer Father   . Bipolar disorder Daughter   . Bipolar disorder Maternal Aunt   . Colon cancer Neg Hx   . Colon polyps Neg Hx   . Esophageal cancer Neg Hx   . Kidney disease Neg Hx   . Gallbladder disease Neg Hx   . Diabetes Neg Hx   . Dementia Neg Hx     PE: BP 140/86   Pulse 90   Ht 5\' 4"  (1.626 m)   Wt  169 lb (76.7 kg)   SpO2 95%   BMI 29.01 kg/m    Wt Readings from Last 3 Encounters:  03/20/19 169 lb (76.7 kg)  03/04/19 179 lb 10.8 oz (81.5 kg)  01/27/19 165 lb (74.8 kg)   Constitutional: overweight, in NAD Eyes: PERRLA, EOMI, no exophthalmos ENT: moist mucous membranes, no thyromegaly, no cervical lymphadenopathy Cardiovascular: RRR, No MRG Respiratory: CTA B Gastrointestinal: abdomen soft, NT, ND, BS+ Musculoskeletal: no deformities, strength intact in all 4 Skin: moist, warm, no rashes Neurological: no tremor with outstretched hands, DTR normal in all 4  ASSESSMENT: 1. LADA, insulin-dependent, uncontrolled, with complications - peripheral neuropathy - gastroparesis?  Component     Latest Ref Rng 07/16/2013  C-Peptide     0.80 - 3.90 ng/mL 1.29  Glucose     70 - 99 mg/dL 098183 (H)  Glutamic Acid Decarb Ab     <=1.0 U/mL 11.3 (H)  Pancreatic Islet Cell Antibody     <5 JDF Units 5 (A)  + anti-pancreatic antibodies >> LADA rather than type 2 DM. She still has a positive C peptide >> still has insulin secretion. For this reason,  we continued the Metformin.  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 HL  PLAN:  1.  Complex patient with very difficult to control LADA due to anxiety and depression.  She has alternating low blood sugars in the 30s with blood sugars in the 300s usually.  She is on a basal bolus insulin regimen and also Metformin.  We tried her on an insulin pump before and she did not do well due to anxiety.  Also, we tried adjustable doses of insulin but she prefers fixed doses. -Since last visit, we decreased her long-acting insulin after she were professional CGM -At this visit, her sugars are better after resolution of her sepsis.  They are still high in the morning, though, in the 200s, but she also has some sugars after closer to goal.  However, since the majority of her sugars remain high, I advised her to increase the Lantus to 32 units at night. -She takes 10 to 12 units of  NovoLog in the morning before breakfast.  This is usually oatmeal and coffee.  Her sugars before lunch are much better after she switched to this practice.  However she did have a low blood sugar 35 after she took 12 units of NovoLog as her sugar in the morning was high.  I advised him to not take 12 units from now on, only 10 for this breakfast. - I suggested to:  Patient Instructions  Please continue: - Metformin 1000 mg with dinner  Please increase: - Lantus 32 units at bedtime  Change: - NovoLog:  10 units in am 8-10 units with lunch 10-12 units with dinner - Novolog Sliding scale: 201-250: + 2 units 251-300: + 3 units 301-350: + 4 units >350: + 5 units  Please return in 3 months with your sugar log.   - advised to check sugars at different times of the day - 4x a day, rotating check times.  We reviewed her sugar log and diet log together. - advised for yearly eye exams >> she is not UTD but has an appointment scheduled - return to clinic in 3 months  2. PN  -Related to diabetes -Continues Neurontin without side effects -I recommended alpha-lipoic acid 600 mg twice a day before, but she did not start this  3. HL -Reviewed latest lipid panel from 02/2019: LDL and triglycerides at goal, HDL low Lab Results  Component Value Date   CHOL 82 02/28/2019   HDL 22 (L) 02/28/2019   LDLCALC 39 02/28/2019   TRIG 105 02/28/2019   CHOLHDL 3.7 02/28/2019  -She is not on a statin  Philemon Kingdom, MD PhD [facility] Endocrinology

## 2019-03-20 NOTE — Patient Instructions (Signed)
Please continue: - Metformin 1000 mg with dinner  Please increase: - Lantus 32 units at bedtime  Change: - NovoLog:  10 units in am 8-10 units with lunch 10-12 units with dinner - Novolog Sliding scale: 201-250: + 2 units 251-300: + 3 units 301-350: + 4 units >350: + 5 units  Please return in 3 months with your sugar log.

## 2019-03-23 DIAGNOSIS — N200 Calculus of kidney: Secondary | ICD-10-CM | POA: Diagnosis not present

## 2019-03-23 DIAGNOSIS — A4151 Sepsis due to Escherichia coli [E. coli]: Secondary | ICD-10-CM | POA: Diagnosis not present

## 2019-03-23 DIAGNOSIS — N12 Tubulo-interstitial nephritis, not specified as acute or chronic: Secondary | ICD-10-CM | POA: Diagnosis not present

## 2019-03-23 DIAGNOSIS — N390 Urinary tract infection, site not specified: Secondary | ICD-10-CM | POA: Diagnosis not present

## 2019-03-24 DIAGNOSIS — N390 Urinary tract infection, site not specified: Secondary | ICD-10-CM | POA: Diagnosis not present

## 2019-03-24 DIAGNOSIS — A4151 Sepsis due to Escherichia coli [E. coli]: Secondary | ICD-10-CM | POA: Diagnosis not present

## 2019-03-24 DIAGNOSIS — N179 Acute kidney failure, unspecified: Secondary | ICD-10-CM | POA: Diagnosis not present

## 2019-03-24 DIAGNOSIS — N12 Tubulo-interstitial nephritis, not specified as acute or chronic: Secondary | ICD-10-CM | POA: Diagnosis not present

## 2019-03-24 DIAGNOSIS — I10 Essential (primary) hypertension: Secondary | ICD-10-CM | POA: Diagnosis not present

## 2019-03-24 DIAGNOSIS — N200 Calculus of kidney: Secondary | ICD-10-CM | POA: Diagnosis not present

## 2019-03-27 ENCOUNTER — Other Ambulatory Visit (HOSPITAL_COMMUNITY)
Admission: RE | Admit: 2019-03-27 | Discharge: 2019-03-27 | Disposition: A | Discharge status: Home / Self Care | Payer: Medicare Other | Source: Ambulatory Visit | Attending: Urology | Admitting: Urology

## 2019-03-27 DIAGNOSIS — Z01812 Encounter for preprocedural laboratory examination: Secondary | ICD-10-CM | POA: Insufficient documentation

## 2019-03-27 DIAGNOSIS — Z20822 Contact with and (suspected) exposure to covid-19: Secondary | ICD-10-CM | POA: Diagnosis not present

## 2019-03-27 LAB — SARS CORONAVIRUS 2 (TAT 6-24 HRS): SARS Coronavirus 2: NEGATIVE

## 2019-03-30 ENCOUNTER — Telehealth: Payer: Self-pay

## 2019-03-30 ENCOUNTER — Other Ambulatory Visit: Payer: Self-pay

## 2019-03-30 ENCOUNTER — Encounter (HOSPITAL_BASED_OUTPATIENT_CLINIC_OR_DEPARTMENT_OTHER): Payer: Self-pay | Admitting: Urology

## 2019-03-30 NOTE — Telephone Encounter (Signed)
Submitted prior authorization for [employer] to Cover My Meds.

## 2019-03-30 NOTE — Progress Notes (Addendum)
Addendum: spoke with [place] zanetto pa and can use ekg from 03-02-2019 for surgery 03-31-2019.  Spoke w/ via phone for pre-op interview---patient Lab needs dos----  I stat 8            Lab results------lov dr Purvis Sheffield 01-27-2019 follow up as needed COVID test ------03-27-2019 Arrive at -------930 am 03-30-2029 NPO after ------midnight Medications to take morning of surgery -----xanax prn, gabapentin, amlodipine, omeprazole, nortriptyline Diabetic medication -----take 1/2 dose hs lantus 03-30-3030, no diabetic meds day of surgery Patient Special Instructions -----none Pre-Op special Istructions -----none Patient verbalized understanding of instructions that were given at this phone interview. Patient denies shortness of breath, chest pain, fever, cough a this phone interview.

## 2019-03-31 ENCOUNTER — Other Ambulatory Visit: Payer: Self-pay

## 2019-03-31 ENCOUNTER — Ambulatory Visit (HOSPITAL_BASED_OUTPATIENT_CLINIC_OR_DEPARTMENT_OTHER): Payer: Medicare Other | Admitting: Physician Assistant

## 2019-03-31 ENCOUNTER — Ambulatory Visit (HOSPITAL_BASED_OUTPATIENT_CLINIC_OR_DEPARTMENT_OTHER)
Admission: RE | Admit: 2019-03-31 | Discharge: 2019-03-31 | Disposition: A | Discharge status: Home / Self Care | Payer: Medicare Other | Attending: Urology | Admitting: Urology

## 2019-03-31 ENCOUNTER — Encounter (HOSPITAL_BASED_OUTPATIENT_CLINIC_OR_DEPARTMENT_OTHER)
Admission: RE | Disposition: A | Discharge status: Home / Self Care | Payer: Self-pay | Source: Home / Self Care | Attending: Urology

## 2019-03-31 ENCOUNTER — Encounter (HOSPITAL_BASED_OUTPATIENT_CLINIC_OR_DEPARTMENT_OTHER): Payer: Self-pay | Admitting: Urology

## 2019-03-31 DIAGNOSIS — I10 Essential (primary) hypertension: Secondary | ICD-10-CM | POA: Insufficient documentation

## 2019-03-31 DIAGNOSIS — K219 Gastro-esophageal reflux disease without esophagitis: Secondary | ICD-10-CM | POA: Insufficient documentation

## 2019-03-31 DIAGNOSIS — F319 Bipolar disorder, unspecified: Secondary | ICD-10-CM | POA: Insufficient documentation

## 2019-03-31 DIAGNOSIS — F419 Anxiety disorder, unspecified: Secondary | ICD-10-CM | POA: Diagnosis not present

## 2019-03-31 DIAGNOSIS — D649 Anemia, unspecified: Secondary | ICD-10-CM | POA: Insufficient documentation

## 2019-03-31 DIAGNOSIS — E1165 Type 2 diabetes mellitus with hyperglycemia: Secondary | ICD-10-CM | POA: Diagnosis not present

## 2019-03-31 DIAGNOSIS — D696 Thrombocytopenia, unspecified: Secondary | ICD-10-CM | POA: Diagnosis not present

## 2019-03-31 DIAGNOSIS — E108 Type 1 diabetes mellitus with unspecified complications: Secondary | ICD-10-CM | POA: Diagnosis not present

## 2019-03-31 DIAGNOSIS — Z794 Long term (current) use of insulin: Secondary | ICD-10-CM | POA: Insufficient documentation

## 2019-03-31 DIAGNOSIS — N201 Calculus of ureter: Secondary | ICD-10-CM | POA: Diagnosis not present

## 2019-03-31 DIAGNOSIS — G709 Myoneural disorder, unspecified: Secondary | ICD-10-CM | POA: Insufficient documentation

## 2019-03-31 DIAGNOSIS — Z888 Allergy status to other drugs, medicaments and biological substances status: Secondary | ICD-10-CM | POA: Insufficient documentation

## 2019-03-31 DIAGNOSIS — Z881 Allergy status to other antibiotic agents status: Secondary | ICD-10-CM | POA: Insufficient documentation

## 2019-03-31 HISTORY — PX: CYSTOSCOPY WITH RETROGRADE PYELOGRAM, URETEROSCOPY AND STENT PLACEMENT: SHX5789

## 2019-03-31 HISTORY — DX: Polyneuropathy, unspecified: G62.9

## 2019-03-31 HISTORY — PX: HOLMIUM LASER APPLICATION: SHX5852

## 2019-03-31 HISTORY — DX: Gastro-esophageal reflux disease without esophagitis: K21.9

## 2019-03-31 HISTORY — DX: Personal history of urinary calculi: Z87.442

## 2019-03-31 LAB — POCT I-STAT, CHEM 8
BUN: 13 mg/dL (ref 8–23)
Calcium, Ion: 1.26 mmol/L (ref 1.15–1.40)
Chloride: 100 mmol/L (ref 98–111)
Creatinine, Ser: 0.6 mg/dL (ref 0.44–1.00)
Glucose, Bld: 193 mg/dL — ABNORMAL HIGH (ref 70–99)
HCT: 35 % — ABNORMAL LOW (ref 36.0–46.0)
Hemoglobin: 11.9 g/dL — ABNORMAL LOW (ref 12.0–15.0)
Potassium: 3.8 mmol/L (ref 3.5–5.1)
Sodium: 139 mmol/L (ref 135–145)
TCO2: 29 mmol/L (ref 22–32)

## 2019-03-31 LAB — GLUCOSE, CAPILLARY: Glucose-Capillary: 207 mg/dL — ABNORMAL HIGH (ref 70–99)

## 2019-03-31 SURGERY — CYSTOURETEROSCOPY, WITH RETROGRADE PYELOGRAM AND STENT INSERTION
Anesthesia: General | Site: Ureter | Laterality: Right

## 2019-03-31 MED ORDER — PROPOFOL 10 MG/ML IV BOLUS
INTRAVENOUS | Status: AC
  Filled 2019-03-31: qty 40

## 2019-03-31 MED ORDER — CIPROFLOXACIN IN D5W 400 MG/200ML IV SOLN
400.0000 mg | INTRAVENOUS | Status: AC
  Administered 2019-03-31: 11:00:00 400 mg via INTRAVENOUS
  Filled 2019-03-31: qty 200

## 2019-03-31 MED ORDER — PROPOFOL 10 MG/ML IV BOLUS
INTRAVENOUS | Status: DC | PRN
  Administered 2019-03-31: 130 mg via INTRAVENOUS

## 2019-03-31 MED ORDER — FENTANYL CITRATE (PF) 100 MCG/2ML IJ SOLN
25.0000 ug | INTRAMUSCULAR | Status: DC | PRN
  Filled 2019-03-31: qty 1

## 2019-03-31 MED ORDER — LIDOCAINE 2% (20 MG/ML) 5 ML SYRINGE
INTRAMUSCULAR | Status: AC
  Filled 2019-03-31: qty 5

## 2019-03-31 MED ORDER — IOHEXOL 300 MG/ML  SOLN
INTRAMUSCULAR | Status: DC | PRN
  Administered 2019-03-31: 10 mL

## 2019-03-31 MED ORDER — OXYCODONE HCL 5 MG/5ML PO SOLN
5.0000 mg | Freq: Once | ORAL | Status: DC | PRN
  Filled 2019-03-31: qty 5

## 2019-03-31 MED ORDER — LIDOCAINE 2% (20 MG/ML) 5 ML SYRINGE
INTRAMUSCULAR | Status: DC | PRN
  Administered 2019-03-31: 60 mg via INTRAVENOUS

## 2019-03-31 MED ORDER — ONDANSETRON HCL 4 MG/2ML IJ SOLN
INTRAMUSCULAR | Status: AC
  Filled 2019-03-31: qty 2

## 2019-03-31 MED ORDER — CIPROFLOXACIN IN D5W 400 MG/200ML IV SOLN
INTRAVENOUS | Status: AC
  Filled 2019-03-31: qty 200

## 2019-03-31 MED ORDER — FENTANYL CITRATE (PF) 100 MCG/2ML IJ SOLN
INTRAMUSCULAR | Status: AC
  Filled 2019-03-31: qty 2

## 2019-03-31 MED ORDER — PHENAZOPYRIDINE HCL 100 MG PO TABS: 100.0000 mg | ORAL_TABLET | Freq: Three times a day (TID) | ORAL | 0 refills | Status: DC | PRN

## 2019-03-31 MED ORDER — DEXAMETHASONE SODIUM PHOSPHATE 10 MG/ML IJ SOLN
INTRAMUSCULAR | Status: AC
  Filled 2019-03-31: qty 1

## 2019-03-31 MED ORDER — SODIUM CHLORIDE 0.9 % IR SOLN
Status: DC | PRN
  Administered 2019-03-31: 3000 mL

## 2019-03-31 MED ORDER — ONDANSETRON HCL 4 MG/2ML IJ SOLN
INTRAMUSCULAR | Status: DC | PRN
  Administered 2019-03-31: 4 mg via INTRAVENOUS

## 2019-03-31 MED ORDER — MIDAZOLAM HCL 2 MG/2ML IJ SOLN
INTRAMUSCULAR | Status: AC
  Filled 2019-03-31: qty 2

## 2019-03-31 MED ORDER — FENTANYL CITRATE (PF) 100 MCG/2ML IJ SOLN
INTRAMUSCULAR | Status: DC | PRN
  Administered 2019-03-31: 25 ug via INTRAVENOUS

## 2019-03-31 MED ORDER — OXYCODONE HCL 5 MG PO TABS
5.0000 mg | ORAL_TABLET | Freq: Once | ORAL | Status: DC | PRN
  Filled 2019-03-31: qty 1

## 2019-03-31 MED ORDER — CIPROFLOXACIN HCL 500 MG PO TABS: 500.0000 mg | ORAL_TABLET | Freq: Once | ORAL | 0 refills | Status: AC

## 2019-03-31 MED ORDER — TRAMADOL HCL 50 MG PO TABS: 50.0000 mg | ORAL_TABLET | Freq: Four times a day (QID) | ORAL | 0 refills | Status: DC | PRN

## 2019-03-31 MED ORDER — LACTATED RINGERS IV SOLN
INTRAVENOUS | Status: DC
  Filled 2019-03-31 (×2): qty 1000

## 2019-03-31 MED ORDER — KETOROLAC TROMETHAMINE 30 MG/ML IJ SOLN
INTRAMUSCULAR | Status: DC | PRN
  Administered 2019-03-31: 30 mg via INTRAVENOUS

## 2019-03-31 MED ORDER — DEXAMETHASONE SODIUM PHOSPHATE 10 MG/ML IJ SOLN
INTRAMUSCULAR | Status: DC | PRN
  Administered 2019-03-31: 5 mg via INTRAVENOUS

## 2019-03-31 MED ORDER — OXYBUTYNIN CHLORIDE 5 MG PO TABS: 5.0000 mg | ORAL_TABLET | Freq: Three times a day (TID) | ORAL | 0 refills | Status: DC | PRN

## 2019-03-31 MED ORDER — SENNOSIDES-DOCUSATE SODIUM 8.6-50 MG PO TABS: 2.0000 | ORAL_TABLET | Freq: Every day | ORAL | 1 refills | Status: DC | PRN

## 2019-03-31 MED ORDER — ONDANSETRON HCL 4 MG/2ML IJ SOLN
4.0000 mg | Freq: Once | INTRAMUSCULAR | Status: DC | PRN
  Filled 2019-03-31: qty 2

## 2019-03-31 MED ORDER — KETOROLAC TROMETHAMINE 30 MG/ML IJ SOLN
INTRAMUSCULAR | Status: AC
  Filled 2019-03-31: qty 2

## 2019-03-31 SURGICAL SUPPLY — 18 items
BAG DRAIN URO-CYSTO SKYTR STRL (DRAIN) ×2 IMPLANT
BAG DRN UROCATH (DRAIN) ×1
BASKET ZERO TIP NITINOL 2.4FR (BASKET) ×2 IMPLANT
CATH URET 5FR 28IN OPEN ENDED (CATHETERS) ×2 IMPLANT
CLOTH BEACON ORANGE TIMEOUT ST (SAFETY) ×2 IMPLANT
DRSG TEGADERM 4X4.75 (GAUZE/BANDAGES/DRESSINGS) IMPLANT
EXTRACTOR STONE 1.7FRX115CM (UROLOGICAL SUPPLIES) IMPLANT
FIBER LASER TRAC TIP (UROLOGICAL SUPPLIES) ×2 IMPLANT
GLOVE BIO SURGEON STRL SZ 6.5 (GLOVE) ×4 IMPLANT
GOWN STRL REUS W/TWL LRG LVL3 (GOWN DISPOSABLE) ×4 IMPLANT
GUIDEWIRE STR DUAL SENSOR (WIRE) ×4 IMPLANT
IV NS IRRIG 3000ML ARTHROMATIC (IV SOLUTION) ×2 IMPLANT
KIT TURNOVER CYSTO (KITS) ×2 IMPLANT
MANIFOLD NEPTUNE II (INSTRUMENTS) ×2 IMPLANT
PACK CYSTO (CUSTOM PROCEDURE TRAY) ×2 IMPLANT
STENT URET 6FRX24 CONTOUR (STENTS) ×2 IMPLANT
TUBE CONNECTING 12X1/4 (SUCTIONS) ×2 IMPLANT
TUBING UROLOGY SET (TUBING) ×2 IMPLANT

## 2019-03-31 NOTE — Anesthesia Preprocedure Evaluation (Deleted)
Anesthesia Evaluation    Airway        Dental  (+) Edentulous Lower, Edentulous Upper   Pulmonary           Cardiovascular hypertension,      Neuro/Psych Anxiety Depression Bipolar Disorder    GI/Hepatic GERD  ,  Endo/Other  diabetes, Type 2  Renal/GU 10 mm stone obstructing the right ureteropelvic junction     Musculoskeletal   Abdominal   Peds  Hematology   Anesthesia Other Findings   Reproductive/Obstetrics                            Anesthesia Physical Anesthesia Plan Anesthesia Quick Evaluation

## 2019-03-31 NOTE — Op Note (Signed)
Preoperative diagnosis: right ureteral calculus  Postoperative diagnosis: right ureteral calculus  Procedure:  1. Cystoscopy 2. right ureteroscopy and stone removal 3. Ureteroscopic laser lithotripsy 4. right 49F x 24cm ureteral stent placement    Surgeon: Kasandra Knudsen, MD  Anesthesia: General  Complications: None  Intraoperative findings:  1. Normal urethra 2. Cystocele 3. Normal bladder 4. 49Fr x 24cm JJ right ureteral stent with string   EBL: Minimal  Specimens: 1. right ureteral calculus  Disposition of specimens: [facility] for stone analysis  Indication: [name] is a 64 y.o.   patient with a 1 cm right ureteral stone and associated right symptoms who underwent urgent stent placement for sepsis approximately 1 month ago. After reviewing the management options for treatment, the patient elected to proceed with the above surgical procedure(s). We have discussed the potential benefits and risks of the procedure, side effects of the proposed treatment, the likelihood of the patient achieving the goals of the procedure, and any potential problems that might occur during the procedure or recuperation. Informed consent has been obtained.   Description of procedure:  The patient was taken to the operating room and general anesthesia was induced.  The patient was placed in the dorsal lithotomy position, prepped and draped in the usual sterile fashion, and preoperative antibiotics were administered. A preoperative time-out was performed.   Cystourethroscopy was performed.  The patient's urethra was examined and was normal. The bladder was then systematically examined in its entirety. There was no evidence for any bladder tumors, stones, or other mucosal pathology.    Attention then turned to the right ureteral orifice where the existing stent was seen emanating.  The stent was grasped with graspers and brought to the urethral meatus.  Attempts at placing a  wire through the stent were unsuccessful.  The stent was then removed and the wire was used to cannulate the ureteral orifice.  The wire was advanced to the kidney with fluoroscopic guidance.  A second wire was then placed in a similar manner.  One wire was secured as a working wire.  Next ureteral access sheath was placed over the working wire and advanced to the kidney with fluoroscopic guidance.  The inner sheath and wire removed.  Flexible ureteroscopy then took place where the 1 cm calculus was seen in the upper pole. The stone was then fragmented with the 200 micron holmium laser fiber.  All stones were then removed from the ureter with an 0 tip basket.  Reinspection of the ureter revealed no remaining visible stones or fragments larger than 2 mm.   The wire was then backloaded through the cystoscope and a ureteral stent was advance over the wire using Seldinger technique.  The stent was positioned appropriately under fluoroscopic and cystoscopic guidance.  The wire was then removed with an adequate stent curl noted in the renal pelvis as well as in the bladder.  The bladder was then emptied and the procedure ended.  The patient appeared to tolerate the procedure well and without complications.  The patient was able to be awakened and transferred to the recovery unit in satisfactory condition.   Disposition: The tether of the stent was tucked inside the patient's vagina.  Instructions for removing the stent have been provided to the patient.

## 2019-03-31 NOTE — Interval H&P Note (Signed)
   History and Physical Interval Note: Patient underwent urgent right ureteral stent placement on 03/02/19 and was treated for bacteremia. She returns today for definitive treatment of the stone.  03/31/2019 9:43 AM  [name]  has presented today for surgery, with the diagnosis of RIGHT URETERAL CALCULI.  The various methods of treatment have been discussed with the patient and family. After consideration of risks, benefits and other options for treatment, the patient has consented to  Procedure(s): CYSTOSCOPY WITH RETROGRADE PYELOGRAM, URETEROSCOPY AND STENT PLACEMENT (Right) HOLMIUM LASER APPLICATION (Right) as a surgical intervention.  The patient's history has been reviewed, patient examined, no change in status, stable for surgery.  I have reviewed the patient's chart and labs.  Questions were answered to the patient's satisfaction.     [name] D Daymen Hassebrock

## 2019-03-31 NOTE — Anesthesia Procedure Notes (Signed)
Procedure Name: LMA Insertion Date/Time: 03/31/2019 10:38 AM Performed by: Tyrone Nine, CRNA Pre-anesthesia Checklist: Patient identified, Emergency Drugs available, Suction available and Patient being monitored Patient Re-evaluated:Patient Re-evaluated prior to induction Oxygen Delivery Method: Circle system utilized Preoxygenation: Pre-oxygenation with 100% oxygen Induction Type: IV induction Ventilation: Mask ventilation without difficulty LMA: LMA inserted LMA Size: 4.0 Number of attempts: 1 Placement Confirmation: positive ETCO2,  CO2 detector and breath sounds checked- equal and bilateral Tube secured with: Tape Dental Injury: Teeth and Oropharynx as per pre-operative assessment

## 2019-03-31 NOTE — Transfer of Care (Signed)
Immediate Anesthesia Transfer of Care Note  Patient: [name]  Procedure(s) Performed: CYSTOSCOPY WITH RETROGRADE PYELOGRAM, URETEROSCOPY AND STENT PLACEMENT (Right Ureter) HOLMIUM LASER APPLICATION (Right Ureter)  Patient Location: PACU  Anesthesia Type:General  Level of Consciousness: awake, alert , oriented and patient cooperative  Airway & Oxygen Therapy: Patient Spontanous Breathing and Patient connected to nasal cannula oxygen  Post-op Assessment: Report given to RN and Post -op Vital signs reviewed and stable  Post vital signs: Reviewed and stable  Last Vitals:  Vitals Value Taken Time  BP 168/80 03/31/19 1145  Temp    Pulse 99 03/31/19 1146  Resp 17 03/31/19 1146  SpO2 96 % 03/31/19 1146  Vitals shown include unvalidated device data.  Last Pain:  Vitals:   03/31/19 0951  TempSrc:   PainSc: 4       Patients Stated Pain Goal: 4 (03/31/19 0951)  Complications: No apparent anesthesia complications

## 2019-03-31 NOTE — Anesthesia Preprocedure Evaluation (Addendum)
Anesthesia Evaluation  Patient identified by MRN, date of birth, ID band Patient confused    Reviewed: Allergy & Precautions, NPO status , Patient's Chart, lab work & pertinent test results  Airway Mallampati: III  TM Distance: >3 FB Neck ROM: Full    Dental  (+) Dental Advisory Given, Edentulous Lower, Edentulous Upper   Pulmonary neg pulmonary ROS,    Pulmonary exam normal        Cardiovascular hypertension, Normal cardiovascular exam     Neuro/Psych PSYCHIATRIC DISORDERS Anxiety Depression Bipolar Disorder  Neuromuscular disease    GI/Hepatic Neg liver ROS, GERD  Medicated,  Endo/Other  negative endocrine ROSdiabetes, Type 1, Insulin Dependent  Renal/GU Renal disease (right ureteral calculi)     Musculoskeletal negative musculoskeletal ROS (+)   Abdominal   Peds  Hematology  (+) Blood dyscrasia (Thrombocytopenia, Hgb 9.9), anemia ,   Anesthesia Other Findings  Originally presented with urosepsis earlier this month - discharged on 03/04/19 and recovering well at home  Poor historian  Reproductive/Obstetrics                           Anesthesia Physical  Anesthesia Plan  ASA: III  Anesthesia Plan: General   Post-op Pain Management:    Induction: Intravenous  PONV Risk Score and Plan: 3 and Dexamethasone, Ondansetron, Treatment may vary due to age or medical condition and Midazolam  Airway Management Planned: LMA  Additional Equipment: None  Intra-op Plan:   Post-operative Plan: Extubation in OR  Informed Consent: I have reviewed the patients History and Physical, chart, labs and discussed the procedure including the risks, benefits and alternatives for the proposed anesthesia with the patient or authorized representative who has indicated his/her understanding and acceptance.       Plan Discussed with:   Anesthesia Plan Comments:        Anesthesia Quick  Evaluation

## 2019-03-31 NOTE — Discharge Instructions (Signed)
DISCHARGE INSTRUCTIONS FOR KIDNEY STONE/URETERAL STENT   MEDICATIONS:  1.  Resume all your other meds from home - except do not take any extra narcotic pain meds that you may have at home.  2. Pyridium is to help with the burning/stinging when you urinate. 3.  Tramadol is for moderate/severe pain, otherwise taking upto 1000 mg every 6 hours of plainTylenol will help treat your pain.   4. Take Cipro one hour prior to removal of your stent.  5. Oxybutynin is to help with bladder cramping/pressure  ACTIVITY:  1. No strenuous activity x 1week  2. No driving while on narcotic pain medications  3. Drink plenty of water  4. Continue to walk at home - you can still get blood clots when you are at home, so keep active, but don't over do it.  5. May return to work/school tomorrow or when you feel ready   BATHING:  1. You can shower and we recommend daily showers  2. You have a string coming from your urethra: The stent string is attached to your ureteral stent. Do not pull on this.   SIGNS/SYMPTOMS TO CALL:  Please call us if you have a fever greater than 101.5, uncontrolled nausea/vomiting, uncontrolled pain, dizziness, unable to urinate, bloody urine, chest pain, shortness of breath, leg swelling, leg pain, redness around wound, drainage from wound, or any other concerns or questions.   You can reach [place] at [phone].   FOLLOW-UP:  1. You have a string attached to your stent, you may remove it on Monday, April 5. To do this, pull the strings until the stents are completely removed. You may feel an odd sensation in your back.   Post Anesthesia Home Care Instructions  Activity: Get plenty of rest for the remainder of the day. A responsible individual must stay with you for 24 hours following the procedure.  For the next 24 hours, DO NOT: -Drive a car -Advertising copywriter -Drink alcoholic beverages -Take any medication unless instructed by your physician -Make any legal decisions or sign  important papers.  Meals: Start with liquid foods such as gelatin or soup. Progress to regular foods as tolerated. Avoid greasy, spicy, heavy foods. If nausea and/or vomiting occur, drink only clear liquids until the nausea and/or vomiting subsides. Call your physician if vomiting continues.  Special Instructions/Symptoms: Your throat may feel dry or sore from the anesthesia or the breathing tube placed in your throat during surgery. If this causes discomfort, gargle with warm salt water. The discomfort should disappear within 24 hours.

## 2019-03-31 NOTE — Anesthesia Postprocedure Evaluation (Signed)
Anesthesia Post Note  Patient: [name]  Procedure(s) Performed: CYSTOSCOPY WITH RETROGRADE PYELOGRAM, URETEROSCOPY AND STENT PLACEMENT (Right Ureter) HOLMIUM LASER APPLICATION (Right Ureter)     Patient location during evaluation: PACU Anesthesia Type: General Level of consciousness: awake and alert Pain management: pain level controlled Vital Signs Assessment: post-procedure vital signs reviewed and stable Respiratory status: spontaneous breathing, nonlabored ventilation and respiratory function stable Cardiovascular status: blood pressure returned to baseline and stable Postop Assessment: no apparent nausea or vomiting Anesthetic complications: no    Last Vitals:  Vitals:   03/31/19 1215 03/31/19 1240  BP: (!) 160/81 (!) 158/80  Pulse: 92 94  Resp: 15 16  Temp:  37.1 C  SpO2: 93% 94%    Last Pain:  Vitals:   03/31/19 1240  TempSrc:   PainSc: 0-No pain                 Lucretia Kern

## 2019-04-01 DIAGNOSIS — I10 Essential (primary) hypertension: Secondary | ICD-10-CM | POA: Diagnosis not present

## 2019-04-01 DIAGNOSIS — G629 Polyneuropathy, unspecified: Secondary | ICD-10-CM | POA: Diagnosis not present

## 2019-04-01 DIAGNOSIS — E139 Other specified diabetes mellitus without complications: Secondary | ICD-10-CM | POA: Diagnosis not present

## 2019-04-01 DIAGNOSIS — E7849 Other hyperlipidemia: Secondary | ICD-10-CM | POA: Diagnosis not present

## 2019-04-01 MED ORDER — TRULANCE 3 MG PO TABS: 3.0000 mg | ORAL_TABLET | Freq: Every day | ORAL | 3 refills | Status: DC

## 2019-04-01 NOTE — Telephone Encounter (Signed)
Approvedtoday CaseId:[id number];Status:Approved;Review Type:Prior Auth;Coverage Start Date:03/02/2019;Coverage End Date:03/31/2020;

## 2019-04-07 ENCOUNTER — Telehealth: Payer: Self-pay | Admitting: Gastroenterology

## 2019-04-07 NOTE — Telephone Encounter (Signed)
Pt called stating that she just had some kidney stones removed and her doctor prescribed senokot and it has helped her a lot with her constipation issues. She wants to know if Dr.Nandigam could prescribe that instead of Trulance because senokot seems to be the only thing that has helped her. Her pharmacy is [facility] on [address]

## 2019-04-07 NOTE — Telephone Encounter (Signed)
Agree, thanks

## 2019-04-07 NOTE — Telephone Encounter (Signed)
Spoke with the patient. Reviewed last office visit. Discussed fiber intake, fluids and exercise. She alternates her fiber supplements and Miralax. She does take Trulance daily. She has good days and bad days. Expresses frustration at her issues of constipation. She will call back with questions. She may use the Senokot-S or generic similar.

## 2019-04-09 DIAGNOSIS — N201 Calculus of ureter: Secondary | ICD-10-CM | POA: Diagnosis not present

## 2019-04-14 DIAGNOSIS — N201 Calculus of ureter: Secondary | ICD-10-CM | POA: Diagnosis not present

## 2019-05-01 DIAGNOSIS — I10 Essential (primary) hypertension: Secondary | ICD-10-CM | POA: Diagnosis not present

## 2019-05-01 DIAGNOSIS — E139 Other specified diabetes mellitus without complications: Secondary | ICD-10-CM | POA: Diagnosis not present

## 2019-05-01 DIAGNOSIS — E7849 Other hyperlipidemia: Secondary | ICD-10-CM | POA: Diagnosis not present

## 2019-05-01 DIAGNOSIS — G629 Polyneuropathy, unspecified: Secondary | ICD-10-CM | POA: Diagnosis not present

## 2019-05-06 DIAGNOSIS — E538 Deficiency of other specified B group vitamins: Secondary | ICD-10-CM | POA: Diagnosis not present

## 2019-05-06 DIAGNOSIS — E7849 Other hyperlipidemia: Secondary | ICD-10-CM | POA: Diagnosis not present

## 2019-05-06 DIAGNOSIS — E663 Overweight: Secondary | ICD-10-CM | POA: Diagnosis not present

## 2019-05-06 DIAGNOSIS — N390 Urinary tract infection, site not specified: Secondary | ICD-10-CM | POA: Diagnosis not present

## 2019-05-06 DIAGNOSIS — I10 Essential (primary) hypertension: Secondary | ICD-10-CM | POA: Diagnosis not present

## 2019-05-06 DIAGNOSIS — D649 Anemia, unspecified: Secondary | ICD-10-CM | POA: Diagnosis not present

## 2019-05-06 DIAGNOSIS — R413 Other amnesia: Secondary | ICD-10-CM | POA: Diagnosis not present

## 2019-05-06 DIAGNOSIS — Z6827 Body mass index (BMI) 27.0-27.9, adult: Secondary | ICD-10-CM | POA: Diagnosis not present

## 2019-05-06 DIAGNOSIS — E1140 Type 2 diabetes mellitus with diabetic neuropathy, unspecified: Secondary | ICD-10-CM | POA: Diagnosis not present

## 2019-05-06 DIAGNOSIS — Z1389 Encounter for screening for other disorder: Secondary | ICD-10-CM | POA: Diagnosis not present

## 2019-05-07 ENCOUNTER — Telehealth: Payer: Self-pay | Admitting: Gastroenterology

## 2019-05-07 NOTE — Telephone Encounter (Signed)
Very good to hear.

## 2019-05-07 NOTE — Telephone Encounter (Signed)
Patient is calling- states that she would like to stop the Trulance and omeprazole that she is taking. She wanted to let Dr. Lavon Paganini know she does not want anymore refills of medication. States that she has been talking to her PCP about options to help her and they have tried something different and it is helping.

## 2019-05-11 DIAGNOSIS — N200 Calculus of kidney: Secondary | ICD-10-CM | POA: Diagnosis not present

## 2019-06-03 ENCOUNTER — Encounter: Payer: Self-pay | Admitting: Internal Medicine

## 2019-06-03 ENCOUNTER — Telehealth: Payer: Self-pay

## 2019-06-03 LAB — HM DIABETES EYE EXAM

## 2019-06-03 NOTE — Telephone Encounter (Signed)
Recall Cologuard letter has been mailed to patients home address on file.

## 2019-06-04 DIAGNOSIS — M47814 Spondylosis without myelopathy or radiculopathy, thoracic region: Secondary | ICD-10-CM | POA: Diagnosis not present

## 2019-06-04 DIAGNOSIS — H8113 Benign paroxysmal vertigo, bilateral: Secondary | ICD-10-CM | POA: Diagnosis not present

## 2019-06-04 DIAGNOSIS — N390 Urinary tract infection, site not specified: Secondary | ICD-10-CM | POA: Diagnosis not present

## 2019-06-04 DIAGNOSIS — E1165 Type 2 diabetes mellitus with hyperglycemia: Secondary | ICD-10-CM | POA: Diagnosis not present

## 2019-06-04 DIAGNOSIS — I10 Essential (primary) hypertension: Secondary | ICD-10-CM | POA: Diagnosis not present

## 2019-06-04 DIAGNOSIS — E109 Type 1 diabetes mellitus without complications: Secondary | ICD-10-CM | POA: Diagnosis not present

## 2019-06-04 DIAGNOSIS — Z6827 Body mass index (BMI) 27.0-27.9, adult: Secondary | ICD-10-CM | POA: Diagnosis not present

## 2019-06-18 ENCOUNTER — Other Ambulatory Visit: Payer: Self-pay

## 2019-06-18 DIAGNOSIS — E139 Other specified diabetes mellitus without complications: Secondary | ICD-10-CM

## 2019-06-18 MED ORDER — FREESTYLE LITE TEST VI STRP: ORAL_STRIP | 2 refills | Status: DC

## 2019-06-23 ENCOUNTER — Other Ambulatory Visit: Payer: Self-pay

## 2019-06-23 ENCOUNTER — Telehealth: Payer: Self-pay | Admitting: Internal Medicine

## 2019-06-23 MED ORDER — LANTUS SOLOSTAR 100 UNIT/ML ~~LOC~~ SOPN: 32.0000 [IU] | PEN_INJECTOR | Freq: Every day | SUBCUTANEOUS | 1 refills | Status: DC

## 2019-06-23 NOTE — Telephone Encounter (Signed)
Medication Refill Request  Did you call your pharmacy and request this refill first? Yes  . If patient has not contacted pharmacy first, instruct them to do so for future refills.  . Remind them that contacting the pharmacy for their refill is the quickest method to get the refill.  . Refill policy also stated that it will take anywhere between 24-72 hours to receive the refill.    Name of medication? lantus  Is this a 90 day supply? yes  Name and location of pharmacy?  [facility] - [place], [place] - [address] Phone:  [phone]  Fax:  [phone]

## 2019-06-23 NOTE — Telephone Encounter (Signed)
Rx sent 

## 2019-06-30 ENCOUNTER — Ambulatory Visit (INDEPENDENT_AMBULATORY_CARE_PROVIDER_SITE_OTHER): Payer: Medicare Other | Admitting: Internal Medicine

## 2019-06-30 ENCOUNTER — Encounter: Payer: Self-pay | Admitting: Internal Medicine

## 2019-06-30 ENCOUNTER — Other Ambulatory Visit: Payer: Self-pay

## 2019-06-30 VITALS — BP 150/80 | HR 102 | Ht 64.0 in | Wt 168.0 lb

## 2019-06-30 DIAGNOSIS — E785 Hyperlipidemia, unspecified: Secondary | ICD-10-CM

## 2019-06-30 DIAGNOSIS — G63 Polyneuropathy in diseases classified elsewhere: Secondary | ICD-10-CM

## 2019-06-30 DIAGNOSIS — E139 Other specified diabetes mellitus without complications: Secondary | ICD-10-CM | POA: Diagnosis not present

## 2019-06-30 LAB — POCT GLYCOSYLATED HEMOGLOBIN (HGB A1C): Hemoglobin A1C: 5.7 % — AB (ref 4.0–5.6)

## 2019-06-30 MED ORDER — GLUCAGON (RDNA) 1 MG IJ KIT: 1.0000 mg | PACK | Freq: Once | INTRAMUSCULAR | 12 refills | Status: DC | PRN

## 2019-06-30 NOTE — Progress Notes (Signed)
Patient ID: [name], female   DOB: 1955-04-09, 64 y.o.   MRN: [id number]  This visit occurred during the SARS-CoV-2 public health emergency.  Safety protocols were in place, including screening questions prior to the visit, additional usage of staff PPE, and extensive cleaning of exam room while observing appropriate contact time as indicated for disinfecting solutions.   HPI: [name] is a 64 y.o.-year-old female, initially referred by her PCP, Dr. Assunta Found (PA: Lenise Herald), returning for f/u for LADA, dx 2005, insulin-dependent since ~2006, controlled, with complications (PN, gastroparesis?).  Last visit 3 months ago.  Before last visit, she was admitted with E. coli bacteremia due to obstructing nephrolithiasis on 02/27/2019.  After the resolution of her sepsis, her sugars improved.  They are more fluctuating in the last weeks, though.  Reviewed history: She was on an Omnipod insulin pump on 08/01/2015. She had severe anxiety and depression and got so overwhelmed with the insulin pump that we had to take her off the pump.   We switched her to a basal-bolus insulin regimen, but she was unable to calculate the insulin doses based on insulin to carb ratio, so now she is using fixed rapid acting insulin doses.  Reviewed HbA1c levels: Lab Results  Component Value Date   HGBA1C 5.1 02/27/2019   HGBA1C 6.7 (A) 11/05/2018   HGBA1C 6.6 (A) 08/15/2018  03/04/2018:  Hba1c calculated from fructosamine is the best in a long time, at 6.84%! 12/19/2017: HbA1c calculated from fructosamine has improved to 7.1%! 09/12/2017: HbA1c  Calculated from the fructosamine is 7.5%, improved from before. 05/07/2017: HbA1c calculated from the fructosamine was 7.7% 02/07/2017: HbA1c calculated from the fructosamine was higher, at 7.7%. 11/07/2016: HbA1c calc. from the fructosamine is a little lower, at 7.25% 08/07/2016: HbA1c calculated from the fructosamine is 7.5% 05/31/2016: HbA1c calculated from  fructosamine is better, at 7.25% 12/15/2015: HbA1c calculated from the fructosamine is higher, 8.4%. 08/24/2015:  HbA1c calculated from fructosamine is 7.4%. 05/24/2015: HbA1c calculated from fructosamine is 6.9%.  She had a professional CGM in 01/2019 which I analyzed -reviewed note: Fre[employer] lib[employer] professional CGM traces between 12/31/2018 at 01/11/2019 were analyzed and the reports will be scanned.  Patient's sugars are variable but consistent patterns were noticed: Sugars are low in the second half of the night and morning, but they increase significantly after breakfast.  Around lunchtime she starts to drop her sugars and they increase but in the less significant fashion after dinner.  Patient was advised to: - reduce the dose of Lantus to 30 units for now.  - If the sugars after breakfast remain high after reducing the Lantus,  increase the dose of rapid acting insulin with breakfast by 2 units. - inject approximately 15 minutes before the meal to see if this helps reducing the sugars after breakfast. -  One of her concerns was about what to do if she is not hungry at lunch.  Reducing the Lantus will help her not drop even if she is not eating, however, if she does feel like she is dropping she may need glucose tablets, 2-4 to keep her sugars up.  She is on: - Metformin 1000 mg with dinner - Lantus  34 >> 30 >> 32-34 units at bedtime - NovoLog:  10-12 >> 10 units in am 8-10 units with lunch 10-12 units with dinner - Novolog Sliding scale: 201-250: + 2 units 251-300: + 3 units 301-350: + 4 units >350: + 5 units  Pt checks her sugars  more than 4 times a day: - am: 55, 183-399 >> 50, 65-249 >> 129-270 >> 49, 63-242 - 2h after b'fast:  211 >> 227, 346 >> 32, 267 >> 57, 157 >> 345 - before lunch: 55, 96-220, 267, 313 >> 35, 83-179 >> 50, 53-268, 278, 291 - 2h after lunch:  128 >> n/c >> 63, 121 >> n/c >> 94 >> n/c - before dinner: 71-257, 269 >> 60-180, 270 >> 37, 41, 44, 70-210,  345 - 2h after dinner: n/c >> 211 >> 49 >> n/c >> 37, 44-222, 260, 300 - bedtime:  <200 >> 31, 34, 91-344 >> 52-259 >> 102-250 - at night: 116 >> 82, 250 >> 90 >> n/c >> 165 >> n/c Lowest sugar was 41 >> 100 >> 31 >> 32 >> 35 (overcorrection) >> 37; she has hypoglycemia awareness in the 50s. Highest sugar was  210 >> 408 >> 313 x1 >> 270 >> 345.  No CKD, last BUN/creatinine:  Lab Results  Component Value Date   BUN 13 03/31/2019   CREATININE 0.60 03/31/2019   Normal ACR: Lab Results  Component Value Date   MICRALBCREAT 5.4 11/07/2016    + HL; lipids: Lab Results  Component Value Date   CHOL 82 02/28/2019   HDL 22 (L) 02/28/2019   LDLCALC 39 02/28/2019   TRIG 105 02/28/2019   CHOLHDL 3.7 02/28/2019  She is not on a statin.  -Last eye exam was on 06/03/2019: No DR reportedly -[facility]: Dr. Janne Lab.   -She has numbness and tingling in her feet.  She has occasional pain.  She continues on Neurontin. She sees a podiatrist [facility]).  She had 2 toenails removed in the past due to fungal infection.  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 She also has a dx of Parkinson ds.  She also has bipolar disease. Also: GERD, HTN, fibromyalgia >> on Neurontin.  Her latest TSH was normal: Lab Results  Component Value Date   TSH 0.45 12/16/2018   ROS: Constitutional: no weight gain/no weight loss, no fatigue, no subjective hyperthermia, no subjective hypothermia Eyes: no blurry vision, no xerophthalmia ENT: no sore throat, no nodules palpated in neck, no dysphagia, no odynophagia, no hoarseness Cardiovascular: no CP/no SOB/no palpitations/no leg swelling Respiratory: no cough/no SOB/no wheezing Gastrointestinal: no N/no V/no D/no C/no acid reflux Musculoskeletal: no muscle aches/no joint aches Skin: no rashes, no hair loss Neurological: no tremors/+ numbness/+ tingling/no dizziness, + disequilibrium  I reviewed pt's medications, allergies, PMH, social hx,  family hx, and changes were documented in the history of present illness. Otherwise, unchanged from my initial visit note.  Past Medical History:  Diagnosis Date  . Anxiety   . Bipolar 1 disorder (HCC)   . Breast density 06/25/2012   Right breast density, will get mammogram and [place]  . Depression   . Diabetes mellitus without complication (HCC)    type 1  . Duodenitis   . GERD (gastroesophageal reflux disease)   . History of kidney stones   . HTN (hypertension)   . Hyperlipidemia   . IBS (irritable bowel syndrome)   . Neuropathy   . Sepsis (HCC) 02/27/2019   Past Surgical History:  Procedure Laterality Date  . ABDOMINAL HYSTERECTOMY     partial  . CHOLECYSTECTOMY    . COLONOSCOPY    . CYSTOSCOPY WITH RETROGRADE PYELOGRAM, URETEROSCOPY AND STENT PLACEMENT Right 03/31/2019   Procedure: CYSTOSCOPY WITH RETROGRADE PYELOGRAM, URETEROSCOPY AND STENT PLACEMENT;  Surgeon: Arita Miss,  Weyman Croon, MD;  Location: [facility];  Service: Urology;  Laterality: Right;  . CYSTOSCOPY WITH STENT PLACEMENT Right 03/02/2019   Procedure: CYSTOSCOPY WITH STENT PLACEMENT;  Surgeon: Noel Christmas, MD;  [facility];  Service: Urology;  Laterality: Right;  . HOLMIUM LASER APPLICATION Right 03/31/2019   Procedure: HOLMIUM LASER APPLICATION;  Surgeon: Noel Christmas, MD;  Location: [facility];  Service: Urology;  Laterality: Right;  . UPPER GASTROINTESTINAL ENDOSCOPY     Social History   Socioeconomic History  . Marital status: Married    Spouse name: phillip  . Number of children: 1  . Years of education: 57  . Highest education level: High school graduate  Occupational History  . Occupation: Disabled  Tobacco Use  . Smoking status: Never Smoker  . Smokeless tobacco: Never Used  Vaping Use  . Vaping Use: Some days  . Substances: Flavoring  Substance and Sexual Activity  . Alcohol use: No    Alcohol/week: 0.0 standard drinks  . Drug use: No  . Sexual activity:  Not Currently  Other Topics Concern  . Not on file  Social History Narrative   Lives at home w/ her husband   Left-handed   Caffeine: 1-2 cups of coffee daily   Social Determinants of Health   Financial Resource Strain:   . Difficulty of Paying Living Expenses:   Food Insecurity:   . Worried About Programme researcher, broadcasting/film/video in the Last Year:   . Barista in the Last Year:   Transportation Needs:   . Freight forwarder (Medical):   Marland Kitchen Lack of Transportation (Non-Medical):   Physical Activity:   . Days of Exercise per Week:   . Minutes of Exercise per Session:   Stress:   . Feeling of Stress :   Social Connections:   . Frequency of Communication with Friends and Family:   . Frequency of Social Gatherings with Friends and Family:   . Attends Religious Services:   . Active Member of Clubs or Organizations:   . Attends Banker Meetings:   Marland Kitchen Marital Status:   Intimate Partner Violence:   . Fear of Current or Ex-Partner:   . Emotionally Abused:   Marland Kitchen Physically Abused:   . Sexually Abused:    Current Outpatient Medications on File Prior to Visit  Medication Sig Dispense Refill  . acetaminophen (TYLENOL) 500 MG tablet Take 1,000 mg by mouth every 6 (six) hours as needed for mild pain or headache.    . ALPRAZolam (XANAX) 1 MG tablet Take 1 mg by mouth 4 (four) times daily as needed for anxiety.     . AMBULATORY NON FORMULARY MEDICATION Squatty Potty x 1 1 each 0  . amLODipine (NORVASC) 5 MG tablet Take 5 mg by mouth daily.    . BD PEN NEEDLE NANO U/F 32G X 4 MM MISC USE WITH LANTUS AND NOVOLOG PENS 200 each 3  . BENICAR 40 MG tablet Take 40 mg by mouth daily.     . cholecalciferol (VITAMIN D3) 25 MCG (1000 UNIT) tablet Take by mouth daily.     Marland Kitchen desipramine (NORPRAMIN) 25 MG tablet 3  qam (Patient not taking: Reported on 03/30/2019) 270 tablet 0  . gabapentin (NEURONTIN) 800 MG tablet Take 800 mg by mouth 3 (three) times daily.     Marland Kitchen glucagon (GLUCAGON EMERGENCY) 1  MG injection Inject 1 mg into the muscle once as needed. 1 each 12  .  glucose blood (FREESTYLE LITE) test strip USE TO CHECK BLOOD SUGAR 6 TIMES PER DAY 600 each 2  . HYDROcodone-acetaminophen (NORCO/VICODIN) 5-325 MG tablet Take 1 tablet by mouth every 6 (six) hours as needed for moderate pain. Has 4 left    . insulin aspart (NOVOLOG FLEXPEN) 100 UNIT/ML FlexPen Inject 10 Units into the skin daily after breakfast AND 8 Units daily before lunch AND 10 Units daily before supper. Max daily 40 units. (Patient taking differently: Inject 10 -12 Units into the skin daily after breakfast AND 8 -10 Units daily before lunch AND 10-12 Units daily before supper.  201-250=2 units, 251-300=3 units, 301-350=4units, >350= 5 unitsMax daily 40 units.) 45 mL 3  . insulin glargine (LANTUS SOLOSTAR) 100 UNIT/ML Solostar Pen Inject 32 Units into the skin daily. 30 mL 1  . Insulin Syringes, Disposable, U-100 1 ML MISC To use for insulin injection.. 100 each 1  . Lancets (FREESTYLE) lancets USE TO TEST BLOOD SUGAR 4 TO 6 TIMES DAILY AS INSTRUCTED 300 each 6  . meclizine (ANTIVERT) 25 MG tablet Take 25 mg by mouth 4 (four) times daily as needed for dizziness.    . metFORMIN (GLUCOPHAGE) 500 MG tablet Take 1 tablet (500 mg total) by mouth 2 (two) times daily with a meal. (Patient taking differently: Take 1,000 mg by mouth every evening. Daily with dinner) 180 tablet 11  . nortriptyline (PAMELOR) 50 MG capsule Take 50 mg by mouth every morning.     Marland Kitchen omeprazole (PRILOSEC) 40 MG capsule TAKE 1 CAPSULE TWICE A DAY 180 capsule 3  . ondansetron (ZOFRAN-ODT) 4 MG disintegrating tablet PLACE 1 TO 2 TABLETS ON TONGUE EVERY 4 TO 6 HOURS AS NEEDED FOR NAUSEA 180 tablet 4  . OSCIMIN 0.125 MG SUBL DISSOLVE 1 TABLET UNDER THE TONGUE EVERY 4 HOURS AS NEEDED (NEED APPOINTMENT) (Patient taking differently: Place 1 tablet under the tongue every 4 (four) hours as needed (IBS symptoms). ) 360 tablet 5  . oxybutynin (DITROPAN) 5 MG tablet Take 1  tablet (5 mg total) by mouth every 8 (eight) hours as needed for bladder spasms. 21 tablet 0  . phenazopyridine (PYRIDIUM) 100 MG tablet Take 1 tablet (100 mg total) by mouth 3 (three) times daily as needed for pain. 9 tablet 0  . Plecanatide (TRULANCE) 3 MG TABS Take 3 mg by mouth daily. 90 tablet 3  . polyethylene glycol (MIRALAX / GLYCOLAX) 17 g packet Take 17 g by mouth 3 (three) times daily.    . Probiotic CAPS Take 1 capsule by mouth daily. 30 capsule 1  . senna-docusate (SENOKOT-S) 8.6-50 MG tablet Take 2 tablets by mouth daily as needed for mild constipation. 30 tablet 1  . simethicone (MYLICON) 80 MG chewable tablet Chew 80 mg by mouth every 6 (six) hours as needed for flatulence.    . traMADol (ULTRAM) 50 MG tablet Take 1 tablet (50 mg total) by mouth every 6 (six) hours as needed for moderate pain. 15 tablet 0  . traZODone (DESYREL) 100 MG tablet 1  qhs  May increse to 2  qhs  After 1 week (Patient taking differently: Take 100 mg by mouth at bedtime. 1  qhs  May increse to 2  qhs  After 1 week) 180 tablet 1   No current facility-administered medications on file prior to visit.   Allergies  Allergen Reactions  . Abilify [Aripiprazole]     Patient is intolerant  . Reglan [Metoclopramide] Other (See Comments)    Chest pains  Family History  Problem Relation Age of Onset  . Hypertension Mother   . Bipolar disorder Mother   . Cancer Mother   . Heart disease Brother   . Thyroid disease Sister   . Hypertension Sister   . Bipolar disorder Sister   . Depression Father   . Cancer Father   . Bipolar disorder Daughter   . Bipolar disorder Maternal Aunt   . Colon cancer Neg Hx   . Colon polyps Neg Hx   . Esophageal cancer Neg Hx   . Kidney disease Neg Hx   . Gallbladder disease Neg Hx   . Diabetes Neg Hx   . Dementia Neg Hx     PE: BP (!) 150/80   Pulse (!) 102   Ht 5\' 4"  (1.626 m)   Wt 168 lb (76.2 kg)   SpO2 94%   BMI 28.84 kg/m    Wt Readings from Last 3  Encounters:  06/30/19 168 lb (76.2 kg)  03/31/19 160 lb 11.2 oz (72.9 kg)  03/20/19 169 lb (76.7 kg)   Constitutional: Slightly overweight, in NAD Eyes: PERRLA, EOMI, no exophthalmos ENT: moist mucous membranes, no thyromegaly, no cervical lymphadenopathy Cardiovascular: tachycardia, RR, No MRG Respiratory: CTA B Gastrointestinal: abdomen soft, NT, ND, BS+ Musculoskeletal: no deformities, strength intact in all 4 Skin: moist, warm, no rashes Neurological: no tremor with outstretched hands, DTR normal in all 4  ASSESSMENT: 1. LADA, insulin-dependent, uncontrolled, with complications - peripheral neuropathy - gastroparesis?  Component     Latest Ref Rng 07/16/2013  C-Peptide     0.80 - 3.90 ng/mL 1.29  Glucose     70 - 99 mg/dL 07/18/2013 (H)  Glutamic Acid Decarb Ab     <=1.0 U/mL 11.3 (H)  Pancreatic Islet Cell Antibody     <5 JDF Units 5 (A)  + anti-pancreatic antibodies >> LADA rather than type 2 DM. She still has a positive C peptide >> still has insulin secretion. For this reason, we continued the Metformin.  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 HL  PLAN:  1.  Complex patient with very difficult to control latent autoimmune diabetes of the adult-due to anxiety and depression.  She has alternating low blood sugars with high blood sugars on a basal-bolus insulin regimen and also Metformin.  In the past, we had her on insulin pump and she did not do well with this due to anxiety.  Also, we tried adjustable NovoLog doses before meals, but she prefers a fixed regimen.  She had a professional CGM placed before and we adjusted her insulin doses based on the patterns.  At last visit, sugars were not better after resolution of her sepsis.  They were still high in the morning, although, in the 200s, with occasional sugars at closer to goal.  However, since the majority of her sugars remain higher than goal, I advised her to increase the Lantus to 32 units at night.  At last visit she was taking 10 to 12 units of  NovoLog before breakfast for oatmeal and coffee.  However, sugars before lunch are much better and even had a low blood sugar 35 after she took 12 units of NovoLog before breakfast.  I advised her to decrease the dose to 10 units before this meal.  We did not change the rest of her regimen.   -At this visit, sugars are more variable without a consistent pattern.  We reviewed together her sugar log and also her diet log.  She keeps  excellent records.  However, unfortunately, it is very difficult to in for a relationship between her diet, insulin doses, and her blood sugar levels.  For now, to make it safer, I advised her to use lower NovoLog doses if she has small meals or only has vegetables for breakfast and dinner.  Otherwise, we will continue the current regimen, including the sliding scale. -I also advised her not to increase the dose of Lantus to 34 units if the sugars are higher at bedtime, but to stay with 32 units -I refilled her glucagon prescription - I suggested to:  Patient Instructions  Please continue: - Metformin 1000 mg with dinner - Lantus 32 units at bedtime  Please change: - NovoLog:  8-10 units in am 8-10 units with lunch 8-10 units with dinner - Novolog Sliding scale: 201-250: + 2 units 251-300: + 3 units 301-350: + 4 units >350: + 5 units  Please return in 2-3 months with your sugar log.   - we checked her HbA1c: 5.7% (slightly higher, but still at goal) - advised to check sugars at different times of the day - 4x a day, rotating check times - advised for yearly eye exams >> she is UTD - return to clinic in 3 months  2. PN  -Due to diabetes -Continues Neurontin without side effects -I recommended alpha-lipoic acid 600 mg twice a day in the past but she did not start this -At this visit she complains of disequilibrium.  She agrees with a referral to physical therapy - [facility] and I referred her there  3. HL -Latest lipid  panel from 02/2019: LDL at goal, HDL low: Lab Results  Component Value Date   CHOL 82 02/28/2019   HDL 22 (L) 02/28/2019   LDLCALC 39 02/28/2019   TRIG 105 02/28/2019   CHOLHDL 3.7 02/28/2019  -She is not on a statin  Carlus Pavlovristina Jabar Krysiak, MD PhD [facility]

## 2019-06-30 NOTE — Patient Instructions (Addendum)
Please continue: - Metformin 1000 mg with dinner - Lantus 32 units at bedtime  Please change: - NovoLog:  8-10 units in am 8-10 units with lunch 8-10 units with dinner - Novolog Sliding scale: 201-250: + 2 units 251-300: + 3 units 301-350: + 4 units >350: + 5 units  Please return in 2-3 months with your sugar log.

## 2019-07-03 ENCOUNTER — Telehealth: Payer: Self-pay | Admitting: Internal Medicine

## 2019-07-03 NOTE — Telephone Encounter (Signed)
M, I am not sure which is before and after meals, but I am assuming that the first 1 is fasting, in the next postprandial, etc. We can try to decrease the Lantus to 30 units and also stay with 8 units of Humalog and only do a 10 units before a larger meal.  Let us see how this goes.

## 2019-07-03 NOTE — Telephone Encounter (Signed)
Left message for patient to return our call at [phone].  

## 2019-07-03 NOTE — Telephone Encounter (Signed)
Patient wanted Dr to know what her sugars were yesterday:  Patient took her sugars every 2 hours:  157  [phone]  Patient would like to be called to be advised at ph# [phone]

## 2019-07-08 ENCOUNTER — Ambulatory Visit (INDEPENDENT_AMBULATORY_CARE_PROVIDER_SITE_OTHER): Payer: Medicare Other | Admitting: Neurology

## 2019-07-08 ENCOUNTER — Encounter: Payer: Self-pay | Admitting: Neurology

## 2019-07-08 ENCOUNTER — Telehealth: Payer: Self-pay | Admitting: Neurology

## 2019-07-08 ENCOUNTER — Other Ambulatory Visit: Payer: Self-pay

## 2019-07-08 VITALS — BP 135/83 | HR 92 | Ht 64.0 in | Wt 168.0 lb

## 2019-07-08 DIAGNOSIS — G2111 Neuroleptic induced parkinsonism: Secondary | ICD-10-CM

## 2019-07-08 DIAGNOSIS — R269 Unspecified abnormalities of gait and mobility: Secondary | ICD-10-CM

## 2019-07-08 DIAGNOSIS — W19XXXA Unspecified fall, initial encounter: Secondary | ICD-10-CM

## 2019-07-08 DIAGNOSIS — G9340 Encephalopathy, unspecified: Secondary | ICD-10-CM

## 2019-07-08 DIAGNOSIS — R4189 Other symptoms and signs involving cognitive functions and awareness: Secondary | ICD-10-CM | POA: Diagnosis not present

## 2019-07-08 DIAGNOSIS — R4689 Other symptoms and signs involving appearance and behavior: Secondary | ICD-10-CM | POA: Diagnosis not present

## 2019-07-08 DIAGNOSIS — R292 Abnormal reflex: Secondary | ICD-10-CM

## 2019-07-08 NOTE — Patient Instructions (Signed)
MRI of the brain and cervical spine (neck) Formal Memory Testing Then return to clinic and can consider other testing based on results  Memory Compensation Strategies  1. Use "WARM" strategy.  W= write it down  A= associate it  R= repeat it  M= make a mental note  2.   You can keep a Glass blower/designer.  Use a 3-ring notebook with sections for the following: calendar, important names and phone numbers,  medications, doctors' names/phone numbers, lists/reminders, and a section to journal what you did  each day.   3.    Use a calendar to write appointments down.  4.    Write yourself a schedule for the day.  This can be placed on the calendar or in a separate section of the Memory Notebook.  Keeping a  regular schedule can help memory.  5.    Use medication organizer with sections for each day or morning/evening pills.  You may need help loading it  6.    Keep a basket, or pegboard by the door.  Place items that you need to take out with you in the basket or on the pegboard.  You may also want to  include a message board for reminders.  7.    Use sticky notes.  Place sticky notes with reminders in a place where the task is performed.  For example: " turn off the  stove" placed by the stove, "lock the door" placed on the door at eye level, " take your medications" on  the bathroom mirror or by the place where you normally take your medications.  8.    Use alarms/timers.  Use while cooking to remind yourself to check on food or as a reminder to take your medicine, or as a  reminder to make a call, or as a reminder to perform another task, etc.

## 2019-07-08 NOTE — Telephone Encounter (Signed)
Medicare/tricare order sent to GI. No auth they will reach out to the patient to schedule.  

## 2019-07-08 NOTE — Progress Notes (Signed)
[facility]    Provider:  Dr Jaynee Eagles Requesting Provider: Redmond School, MD Primary Care Provider:  Redmond School, MD  CC:  Memory loss  HPI:  [name] is a 64 y.o. female here as requested by Redmond School, MD for short term memory loss.  Past medical history neuropathy, hyperlipidemia, hypertension, diabetes, depression, bipolar 1 disorder, anxiety, abnormality of gait, muscle weakness, lithium toxicity, suicidal ideation.  She was seen in neurology over 3 years ago, at that time she denied any issues, I reviewed those notes, she had some memory issues but felt this was age-related due to lots of medications and bipolar disorder, she reported repeating things, denied getting lost, has been paying the bills and he had to take over 10 years ago after developing bipolar disorder, no alcohol or past drug use other than marijuana, she writes things down a lot and that helps her remember appointments, she denied problems cooking and no accidents in the home.  At that time her MMSE was 28 out of 30.  Examination was unremarkable.  She was asked to follow-up in several months but never did.  Neurologic examination at that time in 2018 was nonfocal.  It appears she was recently admitted to [facility] in March of this year, discharge diagnosis urosepsis secondary to E. coli bacteremia in the setting of obstructing nephrolithiasis.  She is on multiple medications that can cause memory loss such as desipramine, gabapentin, benzodiazepines, hydrocodone, meclizine, nortriptyline, trazodone per a review of hospital discharge summary.   I also reviewed notes from Dr. Gerarda Fraction, last B12 was 347 May 06, 2019, at the same time white blood cells were normal, sodium was normal, potassium normal, platelets normal, hemoglobin 10.5 slightly low, glucose was elevated 194, folate was normal 16.3, ferritin was low at 7, creatinine 0.68, EGFR 91 mL/min normal, BUN 25, AST/ALT/alk phos normal.   On May 06, 2019, it appears at that time she had a urine infection based on the labs, she reported 6 falls in the prior year, at that time she was on amitriptyline, alprazolam, desipramine, nortriptyline, gabapentin, meclizine, trazodone (and other medication) which are medications that can interfere with memory.  Dr. Rosaura Carpenter goes exam showed a well-appearing well-nourished patient in no distress, normal eyes, neck, heart, lungs, extremities, focused neurologic and for psychiatric evaluation he stated intact memory, judgment and insight, normal mood and affect.  Patient is here with her husband today who also provides much information.  She has short-term memory loss, she had several falls, Dr. Gerarda Fraction was concerned that there may be "pressure on the brain", husband states the patient gets rattled sometimes but the patient states lots of people have short-term memory loss, 6 falls in the past year, today MMSE 21 out of 30 with 10 animals. She states she does have more short-term memory loss, she can't retain many things, but she remember remote details, she denies dementia in her 20 year old mother but states "they were all crazy" and mother was violent at the end. Her father took amitriptyline for many years (not sure why she states this), husband says "he doesn't want me to say a lot". He says her whole family has depression, brothers and sisters and parents extended family with mood disorders and extensive psychiatric disease, she has a tendency to set things down and can't find it, she will spend an hour trying to find it, her brain "doesn't hold everything", patient was confused about why she was here today thought it was  for follow up for her kidney infection. Husband has paid all the bills for 10 years, she has impulsive spending, she can go to the grocery and fine paying or at a restaurant, she takes her own medications, she rarely misses her meds (husband agrees), she says she feels angry sometimes, she wants  to "talk crap to everyone" but husband says she has been nicer the last few months and she contributes it to colace and resolution of constipation. Husband is more concerned with her aggravation. She has not fallen for 2 months, she fell backwards once when getting out of the car, once she fell in the hallway, husband says it has been glucose and blood pressure dropping. Husband says once she got tangled in her feet and if she gets up too fast her head with "swim" and she gets vertigo, since getting her blood pressure under control and working on diet she has not fallen.  She states that over the years she has been on multiple anti-psychotics such as abilify and zyprexa. Has not been to PT.   Reviewed notes, labs and imaging from outside physicians, which showed:  In March 2018 HIV, B12, MMA, RPR were all unremarkable.  CT head 2020 showed No acute intracranial abnormalities including mass lesion or mass effect, hydrocephalus, extra-axial fluid collection, midline shift, hemorrhage, or acute infarction, large ischemic events (personally reviewed images).  Her ventricles were normal, I did not appreciate any hydrocephalus or enlargement of the ventricles.  Review of Systems: Patient complains of symptoms per HPI as well as the following symptoms depression. Pertinent negatives and positives per HPI. All others negative.   Social History   Socioeconomic History  . Marital status: Married    Spouse name: [name]  . Number of children: 1  . Years of education: 23  . Highest education level: High school graduate  Occupational History  . Occupation: Disabled  Tobacco Use  . Smoking status: Never Smoker  . Smokeless tobacco: Never Used  Vaping Use  . Vaping Use: Some days  . Substances: Flavoring  Substance and Sexual Activity  . Alcohol use: No    Alcohol/week: 0.0 standard drinks  . Drug use: No  . Sexual activity: Not Currently  Other Topics Concern  . Not on file  Social History  Narrative   Lives at home w/ her husband   Left-handed   Caffeine: 1-2 cups of coffee daily   Social Determinants of Health   Financial Resource Strain:   . Difficulty of Paying Living Expenses:   Food Insecurity:   . Worried About Charity fundraiser in the Last Year:   . Arboriculturist in the Last Year:   Transportation Needs:   . Film/video editor (Medical):   Marland Kitchen Lack of Transportation (Non-Medical):   Physical Activity:   . Days of Exercise per Week:   . Minutes of Exercise per Session:   Stress:   . Feeling of Stress :   Social Connections:   . Frequency of Communication with Friends and Family:   . Frequency of Social Gatherings with Friends and Family:   . Attends Religious Services:   . Active Member of Clubs or Organizations:   . Attends Archivist Meetings:   Marland Kitchen Marital Status:   Intimate Partner Violence:   . Fear of Current or Ex-Partner:   . Emotionally Abused:   Marland Kitchen Physically Abused:   . Sexually Abused:     Family History  Problem Relation Age of  Onset  . Hypertension Mother   . Bipolar disorder Mother   . Cancer Mother   . Heart disease Brother   . Thyroid disease Sister   . Hypertension Sister   . Bipolar disorder Sister   . Depression Father   . Cancer Father   . Bipolar disorder Daughter   . Bipolar disorder Maternal Aunt   . Colon cancer Neg Hx   . Colon polyps Neg Hx   . Esophageal cancer Neg Hx   . Kidney disease Neg Hx   . Gallbladder disease Neg Hx   . Diabetes Neg Hx   . Dementia Neg Hx     Past Medical History:  Diagnosis Date  . Anxiety   . Bipolar 1 disorder ([place])   . Breast density 06/25/2012   Right breast density, will get mammogram and [place]  . Depression   . Diabetes mellitus without complication ([place])    type 1  . Duodenitis   . GERD (gastroesophageal reflux disease)   . History of kidney stones   . HTN (hypertension)   . Hyperlipidemia   . IBS (irritable bowel syndrome)   . Neuropathy   . Sepsis ([place])  02/27/2019    Patient Active Problem List   Diagnosis Date Noted  . Cognitive and behavioral changes 07/08/2019  . Anxiety state   . Acute pyelonephritis   . Urinary tract obstruction by kidney stone   . Arterial hypotension   . E coli bacteremia 03/01/2019  . Acute encephalopathy 02/27/2019  . Peripheral neuropathy 02/07/2017  . Constipation due to outlet dysfunction 05/13/2016  . Breast density 06/25/2012  . Encephalopathy, toxic 04/15/2012  . Lithium toxicity 04/15/2012  . Suicide ideation 04/15/2012  . LADA (latent autoimmune diabetes in adults), managed as type 1 ([place])   . Depression   . IBS (irritable bowel syndrome)   . HTN (hypertension)   . Bipolar 1 disorder ([place])   . Abnormality of gait 11/07/2011  . Muscle weakness (generalized) 11/07/2011    Past Surgical History:  Procedure Laterality Date  . ABDOMINAL HYSTERECTOMY     partial  . CHOLECYSTECTOMY    . COLONOSCOPY    . CYSTOSCOPY WITH RETROGRADE PYELOGRAM, URETEROSCOPY AND STENT PLACEMENT Right 03/31/2019   Procedure: CYSTOSCOPY WITH RETROGRADE PYELOGRAM, URETEROSCOPY AND STENT PLACEMENT;  Surgeon: Robley Fries, MD;  Location: [facility];  Service: Urology;  Laterality: Right;  . CYSTOSCOPY WITH STENT PLACEMENT Right 03/02/2019   Procedure: CYSTOSCOPY WITH STENT PLACEMENT;  Surgeon: Robley Fries, MD;  Location: [place];  Service: Urology;  Laterality: Right;  . HOLMIUM LASER APPLICATION Right 7/62/8315   Procedure: HOLMIUM LASER APPLICATION;  Surgeon: Robley Fries, MD;  Location: [facility];  Service: Urology;  Laterality: Right;  . UPPER GASTROINTESTINAL ENDOSCOPY      Current Outpatient Medications  Medication Sig Dispense Refill  . acetaminophen (TYLENOL) 500 MG tablet Take 1,000 mg by mouth every 6 (six) hours as needed for mild pain or headache.    . ALPRAZolam (XANAX) 1 MG tablet Take 1 mg by mouth 4 (four) times daily as needed for anxiety.     . AMBULATORY NON  FORMULARY MEDICATION Squatty Potty x 1 1 each 0  . amLODipine (NORVASC) 5 MG tablet Take 5 mg by mouth daily.    . BD PEN NEEDLE NANO U/F 32G X 4 MM MISC USE WITH LANTUS AND NOVOLOG PENS 200 each 3  . BENICAR 40 MG tablet Take 40 mg by mouth  daily.     . betamethasone dipropionate 0.05 % cream Apply topically 2 (two) times daily.    . cholecalciferol (VITAMIN D3) 25 MCG (1000 UNIT) tablet Take by mouth daily.     . Cyanocobalamin (VITAMIN B-12 PO) Take by mouth.    . cycloSPORINE (RESTASIS) 0.05 % ophthalmic emulsion 1 drop 2 (two) times daily.    Mariane Baumgarten Sodium (COLACE PO) Take by mouth daily.    . ferrous sulfate 325 (65 FE) MG tablet Take 325 mg by mouth in the morning, at noon, and at bedtime.    Marland Kitchen FIBER PO Take by mouth.    . gabapentin (NEURONTIN) 800 MG tablet Take 800 mg by mouth 3 (three) times daily.     Marland Kitchen glucagon 1 MG injection Inject 1 mg into the muscle once as needed for up to 1 dose. 1 each 12  . glucose blood (FREESTYLE LITE) test strip USE TO CHECK BLOOD SUGAR 6 TIMES PER DAY 600 each 2  . insulin aspart (NOVOLOG FLEXPEN) 100 UNIT/ML FlexPen Inject 10 Units into the skin daily after breakfast AND 8 Units daily before lunch AND 10 Units daily before supper. Max daily 40 units. (Patient taking differently: Inject 10 -12 Units into the skin daily after breakfast AND 8 -10 Units daily before lunch AND 10-12 Units daily before supper.  201-250=2 units, 251-300=3 units, 301-350=4units, >350= 5 unitsMax daily 40 units.) 45 mL 3  . insulin glargine (LANTUS SOLOSTAR) 100 UNIT/ML Solostar Pen Inject 32 Units into the skin daily. 30 mL 1  . Insulin Syringes, Disposable, U-100 1 ML MISC To use for insulin injection.. 100 each 1  . Lancets (FREESTYLE) lancets USE TO TEST BLOOD SUGAR 4 TO 6 TIMES DAILY AS INSTRUCTED 300 each 6  . metFORMIN (GLUCOPHAGE) 500 MG tablet Take 1 tablet (500 mg total) by mouth 2 (two) times daily with a meal. (Patient taking differently: Take 500 mg by mouth in the  morning and at bedtime. Daily with dinner) 180 tablet 11  . nortriptyline (PAMELOR) 50 MG capsule Take 50 mg by mouth at bedtime.     Marland Kitchen omeprazole (PRILOSEC) 40 MG capsule TAKE 1 CAPSULE TWICE A DAY (Patient taking differently: daily. ) 180 capsule 3  . ondansetron (ZOFRAN-ODT) 4 MG disintegrating tablet PLACE 1 TO 2 TABLETS ON TONGUE EVERY 4 TO 6 HOURS AS NEEDED FOR NAUSEA 180 tablet 4  . Probiotic CAPS Take 1 capsule by mouth daily. 30 capsule 1  . traZODone (DESYREL) 100 MG tablet 1  qhs  May increse to 2  qhs  After 1 week (Patient taking differently: Take 100 mg by mouth at bedtime. 1  qhs  May increse to 2  qhs  After 1 week) 180 tablet 1   No current facility-administered medications for this visit.    Allergies as of 07/08/2019 - Review Complete 07/08/2019  Allergen Reaction Noted  . Abilify [aripiprazole]  08/10/2016  . Phenergan [promethazine] Diarrhea and Nausea And Vomiting 07/08/2019  . Reglan [metoclopramide] Other (See Comments) 03/23/2013    Vitals: BP 135/83 (BP Location: Right Arm, Patient Position: Sitting)   Pulse 92   Ht '5\' 4"'  (1.626 m)   Wt 168 lb (76.2 kg)   BMI 28.84 kg/m  Last Weight:  Wt Readings from Last 1 Encounters:  07/08/19 168 lb (76.2 kg)   Last Height:   Ht Readings from Last 1 Encounters:  07/08/19 '5\' 4"'  (1.626 m)     Physical exam: Exam: Gen: NAD, conversant, well  nourised, obese, well groomed    CV: RRR, no MRG. No Carotid Bruits. No peripheral edema, warm, nontender Eyes: Conjunctivae clear without exudates or hemorrhage  Neuro: Detailed Neurologic Exam  Speech:    Speech is normal; fluent and spontaneous with normal comprehension.  Cognition:    MMSE - Mini Mental State Exam 07/08/2019 03/28/2016  Orientation to time 4 5  Orientation to Place 5 4  Registration 3 3  Attention/ Calculation 0 5  Recall 1 2  Language- name 2 objects 2 2  Language- repeat 1 1  Language- follow 3 step command 3 3  Language- read & follow direction  1 1  Write a sentence 1 1  Copy design 0 1  Total score 21 28    Cranial Nerves: Hypomimia    The pupils are equal, round, and reactive to light. Attempted fundoscopy could not visualize.  Visual fields are full to finger confrontation. Extraocular movements are intact. Trigeminal sensation is intact and the muscles of mastication are normal. The face is symmetric. The palate elevates in the midline. Hearing intact. Voice is normal. Shoulder shrug is normal. The tongue has normal motion without fasciculations.   Coordination:    Normal finger to nose and heel to shin.  Gait: erect posture, low clearance, decreased arm swing, en bloc turning.   Motor Observation:    No asymmetry, no atrophy, and no involuntary movements noted. Tone:    Normal muscle tone.    Posture:    Posture is normal. normal erect    Strength:    Strength is V/V in the upper and lower limbs.      Sensation: intact to LT     Reflex Exam:  DTR's: absent AJs otherwise deep tendon reflexes in the upper and lower extremities are brisk bilaterally.   Toes:    The toes are downgoing bilaterally.   Clonus:    Clonus is absent.    Assessment/Plan:  Goodell is a 64 y.o. female here as requested by Redmond School, MD for short term memory loss.  Past medical history neuropathy, hyperlipidemia, hypertension, diabetes, depression, bipolar 1 disorder, anxiety, abnormality of gait, muscle weakness, lithium toxicity, suicidal ideation."I have no hope" she states she she has ongoing depression and anxiety, FHx extensive psychiatric disease, unknown FHx of dementia,   -This is a patient with an extensive history of psychiatric disorders and medications.  I saw her 3 years ago for memory loss and today she is here for the same.  Mini-Mental status was 28 out of 30 several years ago today is 21 out of 30.  However I think it will be very difficult to differentiate between psychiatric disease and/or neurodegenerative brain disorder  in this patient.  We will send her to formal memory testing and see if our colleagues can help [place] sort this out. -She has been on multiple dopaminergic blocking agents in the past and I think this may account for some mild parkinsonian traits that she has on examination such as her gait with low clearance, hypomimia, decreased arm swing.  I will get an MRI of the brain and cervical spine to ensure that she does not have NPH or other disorder such as cervical stenosis with myelopathy given her very brisk reflexes and falls.  But I think she might have some parkinsonian traits secondary to dopamine blocking agents in the past/antipsychotic medications which can be the cause of gait disorder and falls. Recommend PT.  Formal memory testing  Orders Placed This Encounter  Procedures  . MR BRAIN W WO CONTRAST  . MR CERVICAL SPINE WO CONTRAST  . Ambulatory referral to Physical Therapy  . Ambulatory referral to Neuropsychology   No orders of the defined types were placed in this encounter.   Cc: Redmond School, MD,    Sarina Ill, MD  [facility] [address] [place], [place] [address]  I spent 90 minutes of face-to-face and non-face-to-face time with patient on the  1. Cognitive and behavioral changes   2. Fall, initial encounter   3. 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 Encephalopathy   6. Abnormal DTR (deep tendon reflex)    diagnosis.  This included previsit chart review, lab review, study review, order entry, electronic health record documentation, patient education on the different diagnostic and therapeutic options, counseling and coordination of care, risks and benefits of management, compliance, or risk factor reduction  Phone [phone] Fax [phone]

## 2019-07-13 DIAGNOSIS — E109 Type 1 diabetes mellitus without complications: Secondary | ICD-10-CM | POA: Diagnosis not present

## 2019-07-13 DIAGNOSIS — Z6827 Body mass index (BMI) 27.0-27.9, adult: Secondary | ICD-10-CM | POA: Diagnosis not present

## 2019-07-13 DIAGNOSIS — Z794 Long term (current) use of insulin: Secondary | ICD-10-CM | POA: Diagnosis not present

## 2019-07-13 DIAGNOSIS — K219 Gastro-esophageal reflux disease without esophagitis: Secondary | ICD-10-CM | POA: Diagnosis not present

## 2019-07-13 DIAGNOSIS — G894 Chronic pain syndrome: Secondary | ICD-10-CM | POA: Diagnosis not present

## 2019-07-13 DIAGNOSIS — N3000 Acute cystitis without hematuria: Secondary | ICD-10-CM | POA: Diagnosis not present

## 2019-07-13 DIAGNOSIS — E139 Other specified diabetes mellitus without complications: Secondary | ICD-10-CM | POA: Diagnosis not present

## 2019-07-13 DIAGNOSIS — I10 Essential (primary) hypertension: Secondary | ICD-10-CM | POA: Diagnosis not present

## 2019-07-13 DIAGNOSIS — Z0000 Encounter for general adult medical examination without abnormal findings: Secondary | ICD-10-CM | POA: Diagnosis not present

## 2019-07-13 DIAGNOSIS — F419 Anxiety disorder, unspecified: Secondary | ICD-10-CM | POA: Diagnosis not present

## 2019-07-13 DIAGNOSIS — Z1389 Encounter for screening for other disorder: Secondary | ICD-10-CM | POA: Diagnosis not present

## 2019-07-13 DIAGNOSIS — N390 Urinary tract infection, site not specified: Secondary | ICD-10-CM | POA: Diagnosis not present

## 2019-07-20 ENCOUNTER — Telehealth: Payer: Self-pay

## 2019-07-20 NOTE — Telephone Encounter (Signed)
Called and spoke to patients husband his wife gave him wrong information. [facility] has called them to schedule. Patient 's husband will call and scheduled . 07/16/19 2nd attempt [facility] EPic Order [facility] 07/09/19 1st Attempt [facility] PT LMOM Epic Order [facility] 07/08/19 Medicare/tricare order sent to GI EE 07/17/19 3RD ATTEMPT-LMOM TO SCHEUDLE/CLC

## 2019-07-20 NOTE — Telephone Encounter (Signed)
Anne Hahn, pt's husband, per DPR, called and reported that pt received a call regarding her MRI and was told to go to a diagnostic and breast center for her MRI. They went to this center but was not listed as a pt. Please call pt's husband at  [phone] to get this sorted out.

## 2019-07-25 NOTE — Telephone Encounter (Signed)
Noted, thank you. Patient is scheduled at GI for 07/30/19

## 2019-07-30 ENCOUNTER — Ambulatory Visit
Admission: RE | Admit: 2019-07-30 | Discharge: 2019-07-30 | Disposition: A | Discharge status: Home / Self Care | Payer: Medicare Other | Source: Ambulatory Visit | Attending: Neurology | Admitting: Neurology

## 2019-07-30 ENCOUNTER — Other Ambulatory Visit: Payer: Self-pay

## 2019-07-30 DIAGNOSIS — G9340 Encephalopathy, unspecified: Secondary | ICD-10-CM

## 2019-07-30 DIAGNOSIS — R269 Unspecified abnormalities of gait and mobility: Secondary | ICD-10-CM

## 2019-07-30 DIAGNOSIS — R4189 Other symptoms and signs involving cognitive functions and awareness: Secondary | ICD-10-CM

## 2019-07-30 DIAGNOSIS — R4689 Other symptoms and signs involving appearance and behavior: Secondary | ICD-10-CM

## 2019-07-30 DIAGNOSIS — R292 Abnormal reflex: Secondary | ICD-10-CM

## 2019-07-30 DIAGNOSIS — W19XXXA Unspecified fall, initial encounter: Secondary | ICD-10-CM

## 2019-07-30 DIAGNOSIS — G2111 Neuroleptic induced parkinsonism: Secondary | ICD-10-CM

## 2019-07-30 MED ORDER — GADOBENATE DIMEGLUMINE 529 MG/ML IV SOLN
16.0000 mL | Freq: Once | INTRAVENOUS | Status: AC | PRN
  Administered 2019-07-30: 16 mL via INTRAVENOUS

## 2019-07-31 DIAGNOSIS — N200 Calculus of kidney: Secondary | ICD-10-CM | POA: Diagnosis not present

## 2019-07-31 DIAGNOSIS — R8279 Other abnormal findings on microbiological examination of urine: Secondary | ICD-10-CM | POA: Diagnosis not present

## 2019-08-04 NOTE — Telephone Encounter (Signed)
Patient called stating her sugars have been low and she is wanting to know when she needs to eat and take her shot and she asked the nurse to give her a call back at [phone].

## 2019-08-04 NOTE — Telephone Encounter (Signed)
Returned pt call. Provided her with Dr. Charlean Sanfilippo previous recommendations and advised she call if this does not help. Verbalized acceptance and understanding.

## 2019-08-05 ENCOUNTER — Telehealth: Payer: Self-pay | Admitting: *Deleted

## 2019-08-05 NOTE — Telephone Encounter (Addendum)
-----   Message from Anson Fret, MD sent at 07/30/2019  5:48 PM EDT ----- No changes on brain MRI from 02/2018. 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 No acute abnormality and no changes. thanks  Anson Fret, MD  07/30/2019 5:46 PM EDT     MRI of the cervical spine unremarkable nothing significant found great news thanks

## 2019-08-05 NOTE — Telephone Encounter (Signed)
Spoke with patient's husband [place] ([place]) and discussed MRI brain and c-spine results. He was unaware of the old stroke so we discussed prevention with diet, cholesterol, diabetes, high blood pressure control and exercise as appropriate. His questions were answered. He stated he had not heard from neuropsych office or physical therapy. I let him know Dr. Marvetta Gibbons office had LVM on 7/9 to schedule. He requested that I call him back and LVM with the numbers. Messaged left after phone call ended with the numbers for both Dr. Marvetta Gibbons office and outpatient neuro rehab.

## 2019-08-11 ENCOUNTER — Telehealth: Payer: Self-pay | Admitting: Internal Medicine

## 2019-08-11 ENCOUNTER — Other Ambulatory Visit: Payer: Self-pay | Admitting: Internal Medicine

## 2019-08-11 NOTE — Telephone Encounter (Signed)
Patient called to advised that yesterday 0(she had low blood sugars of 43 and 53 between 1p and 5p  Would like a call back to assist her lows phone #[phone]

## 2019-08-11 NOTE — Telephone Encounter (Signed)
Contacted patient for more information.  Per patient:  Yesterday at 9 AM CBG 161 before breakfast                      Noon CBG 119 lunch was fish, coleslaw and small amt mac and cheese.                      2:00 PM CBG 43 patient ate two glucose tabs.                      2:30 PM CBG 89                      3:30 PM patient started to feel "funny" checked CBG 53 patient took one glucose tab and a 6 oz pepsi                      5:30 PM CBG 125                      Bedtime CBG 97  Today CBG in the AM 117 and at noon 163.  Per last chart note patient taking meds and listed below except Metformin, she is taking 500 MG BID  Patient Instructions  Please continue: - Metformin 1000 mg with dinner - Lantus 32 units at bedtime  Please change: - NovoLog:  8-10 units in am 8-10 units with lunch 8-10 units with dinner

## 2019-08-11 NOTE — Telephone Encounter (Signed)
If she is having a low carbohydrate meal which she had at lunchtime she will need to take only 4 units of NovoLog

## 2019-08-12 NOTE — Telephone Encounter (Signed)
Left message for patient to return our call at [phone].  

## 2019-08-14 NOTE — Telephone Encounter (Signed)
Today's Blood Sugars and meal  830 AM - CBG 163 took 12 units of Novolog 1100 AM - CBG 43 - took 2 Glucose tablets, and finished her oatmeal 200 PM - CBG 45 - took 2 Glucose tablets, but has not had lunch yet  She is nauseous, shaky, and not "thinking straight"  This has been happening the last week.

## 2019-08-14 NOTE — Telephone Encounter (Signed)
She needs to cut down her NovoLog to half the usual doses and Lantus to  30.  Add more protein to breakfast

## 2019-08-14 NOTE — Telephone Encounter (Signed)
Current CBG 79

## 2019-08-14 NOTE — Telephone Encounter (Signed)
Routing to on call provider. Please review concern below and advise.  Charli - please text Dr. Lucianne Muss to make him aware to review this message

## 2019-08-14 NOTE — Telephone Encounter (Signed)
Fasting blood sugar for 08/13/19 99 -8 units of Novolog and oatmeal and sugar free fruit 130pm- 69- [place] salad and glucose tablet 3pm-190-10 units of Novolog - tuna salad and muffin 4pm- 1/2 ice cream sandwich 6pm-292- 12 units - hot dog w/o bread and corn on the cob 10pm-213- 34 units of Lantus Solostar.

## 2019-08-14 NOTE — Telephone Encounter (Signed)
Spoke with patient. Relayed Dr. Remus Blake instructions. Patient verbalized understanding and did not have any questions.

## 2019-08-18 DIAGNOSIS — Z6828 Body mass index (BMI) 28.0-28.9, adult: Secondary | ICD-10-CM | POA: Diagnosis not present

## 2019-08-18 DIAGNOSIS — E119 Type 2 diabetes mellitus without complications: Secondary | ICD-10-CM | POA: Diagnosis not present

## 2019-08-18 DIAGNOSIS — E663 Overweight: Secondary | ICD-10-CM | POA: Diagnosis not present

## 2019-08-18 DIAGNOSIS — I10 Essential (primary) hypertension: Secondary | ICD-10-CM | POA: Diagnosis not present

## 2019-08-24 ENCOUNTER — Other Ambulatory Visit: Payer: Self-pay

## 2019-08-24 ENCOUNTER — Ambulatory Visit (INDEPENDENT_AMBULATORY_CARE_PROVIDER_SITE_OTHER): Payer: Medicare Other | Admitting: Internal Medicine

## 2019-08-24 ENCOUNTER — Encounter: Payer: Self-pay | Admitting: Internal Medicine

## 2019-08-24 VITALS — BP 150/90 | HR 105 | Ht 64.0 in | Wt 173.0 lb

## 2019-08-24 DIAGNOSIS — E139 Other specified diabetes mellitus without complications: Secondary | ICD-10-CM | POA: Diagnosis not present

## 2019-08-24 DIAGNOSIS — G63 Polyneuropathy in diseases classified elsewhere: Secondary | ICD-10-CM

## 2019-08-24 NOTE — Progress Notes (Signed)
Patient ID: [name], female   DOB: 1955-11-13, 64 y.o.   MRN: [id number]  This visit occurred during the SARS-CoV-2 public health emergency.  Safety protocols were in place, including screening questions prior to the visit, additional usage of staff PPE, and extensive cleaning of exam room while observing appropriate contact time as indicated for disinfecting solutions.   HPI: [name] is a 64 y.o.-year-old female, initially referred by her PCP, Dr. Sharilyn [name] (PA: Collene [name]), returning for f/u for LADA, dx 2005, insulin-dependent since ~2006, controlled, with complications (PN, gastroparesis?).  Last visit 2 months ago.  She continues to be seen by neuro.  She has short-term memory loss.  She also continues to have falls.  I recommended physical therapy at last visit due to disequilibrium, but she did not start this yet.  Reviewed history: She was on an Omnipod insulin pump on 08/01/2015. She had severe anxiety and depression and got so overwhelmed with the insulin pump that we had to take her off the pump.   We switched her to a basal-bolus insulin regimen, but she was unable to calculate the insulin doses based on insulin to carb ratio, so now she is using fixed rapid acting insulin doses.  Reviewed HbA1c levels: Lab Results  Component Value Date   HGBA1C 5.7 (A) 06/30/2019   HGBA1C 5.1 02/27/2019   HGBA1C 6.7 (A) 11/05/2018  03/04/2018:  Hba1c calculated from fructosamine is the best in a long time, at 6.84%! 12/19/2017: HbA1c calculated from fructosamine has improved to 7.1%! 09/12/2017: HbA1c  Calculated from the fructosamine is 7.5%, improved from before. 05/07/2017: HbA1c calculated from the fructosamine was 7.7% 02/07/2017: HbA1c calculated from the fructosamine was higher, at 7.7%. 11/07/2016: HbA1c calc. from the fructosamine is a little lower, at 7.25% 08/07/2016: HbA1c calculated from the fructosamine is 7.5% 05/31/2016: HbA1c calculated from fructosamine is  better, at 7.25% 12/15/2015: HbA1c calculated from the fructosamine is higher, 8.4%. 08/24/2015:  HbA1c calculated from fructosamine is 7.4%. 05/24/2015: HbA1c calculated from fructosamine is 6.9%.  In 01/2019, she had a professional CGM: [name] professional CGM traces between 12/31/2018 at 01/11/2019 were analyzed and the reports will be scanned.  Patient's sugars are variable but consistent patterns were noticed: Sugars are low in the second half of the night and morning, but they increase significantly after breakfast.  Around lunchtime she starts to drop her sugars and they increase but in the less significant fashion after dinner.  Patient was advised to: - reduce the dose of Lantus to 30 units for now.  - If the sugars after breakfast remain high after reducing the Lantus,  increase the dose of rapid acting insulin with breakfast by 2 units. - inject approximately 15 minutes before the meal to see if this helps reducing the sugars after breakfast. -  One of her concerns was about what to do if she is not hungry at lunch.  Reducing the Lantus will help her not drop even if she is not eating, however, if she does feel like she is dropping she may need glucose tablets, 2-4 to keep her sugars up.  She is on: - Metformin 1000 mg with dinner >> 500 mg 2x a day - Lantus  34 >> 30 >> 32-34 >> 30-34 units at bedtime - NovoLog:  10-12 >> 10 >> 8-10 units in am 8-10 units with lunch 10-12 >> 8-10  units with dinner - Novolog Sliding scale: 201-250: + 2 units 251-300: + 3 units 301-350: + 4  units >350: + 5 units  Pt checks her sugars more than 4 times a day: - am: 55, 183-399 >> 50, 65-249 >> 129-270 >> 49, 63-242 >> 44, 66-263 - 2h after b'fast:   227, 346 >> 32, 267 >> 57, 157 >> 345 >> 43, 51 - before lunch:  35, 83-179 >> 50, 53-268, 278, 291 >> 45-228, 232 - 2h after lunch:  128 >> n/c >> 63, 121 >> n/c >> 94 >> n/c >> 43 - before dinner: 60-180, 270 >> 37, 41, 44, 70-210,  345 >> 47-208, 292 - 2h after dinner: 49 >> n/c >> 37, 44-222, 260, 300 >> n/c - bedtime:  31, 34, 91-344 >> 52-259 >> 102-250 >> 59-219, 310 - at night: 82, 250 >> 90 >> n/c >> 165 >> n/c >> 32, 90 Lowest sugar was 31 >> 32 >> 35 >> 37 >> 32; she has hypoglycemia awareness in the 50s. Highest sugar was   270 >> 345.  No CKD, last BUN/creatinine:  Lab Results  Component Value Date   BUN 13 03/31/2019   CREATININE 0.60 03/31/2019   Normal ACR: Lab Results  Component Value Date   MICRALBCREAT 5.4 11/07/2016    + Dyslipidemia; lipids: Lab Results  Component Value Date   CHOL 82 02/28/2019   HDL 22 (L) 02/28/2019   LDLCALC 39 02/28/2019   TRIG 105 02/28/2019   CHOLHDL 3.7 02/28/2019  Not on a statin.  -Last eye exam was on 06/03/2019: No DR reportedly -my Eye Dr: [name].   -+ Numbness and tingling in her feet.  She also has occasional pain.  She continues on Neurontin. She sees a podiatrist [facility]).  She had 2 toenails removed in the past due to fungal infection.  Pt has a h/o admission for [name] toxicity 04/2012.  She has a diagnosis of Parkinson's disease.  She also has bipolar disease. Also: GERD, HTN, fibromyalgia >> on Neurontin.  Latest TSH was normal: Lab Results  Component Value Date   TSH 0.45 12/16/2018   ROS: Constitutional: no weight gain/no weight loss, no fatigue, no subjective hyperthermia, no subjective hypothermia Eyes: no blurry vision, no xerophthalmia ENT: no sore throat, no nodules palpated in neck, no dysphagia, no odynophagia, no hoarseness Cardiovascular: no CP/no SOB/no palpitations/no leg swelling Respiratory: no cough/no SOB/no wheezing Gastrointestinal: no N/no V/no D/no C/no acid reflux, + AP Musculoskeletal: no muscle aches/+ joint aches (L shoulder) Skin: no rashes, no hair loss Neurological: no tremors/+ numbness/+ tingling/no dizziness, + disequilibrium  I reviewed pt's medications, allergies, PMH, social hx, family  hx, and changes were documented in the history of present illness. Otherwise, unchanged from my initial visit note.  Past Medical History:  Diagnosis Date   Anxiety    Bipolar 1 disorder ([place])    Breast density 06/25/2012   Right breast density, will get mammogram and [place]   Depression    Diabetes mellitus without complication (HCC)    type 1   Duodenitis    GERD (gastroesophageal reflux disease)    History of kidney stones    HTN (hypertension)    Hyperlipidemia    IBS (irritable bowel syndrome)    Neuropathy    Sepsis ([place]) 02/27/2019   Past Surgical History:  Procedure Laterality Date   ABDOMINAL HYSTERECTOMY     partial   CHOLECYSTECTOMY     COLONOSCOPY     CYSTOSCOPY WITH RETROGRADE PYELOGRAM, URETEROSCOPY AND STENT PLACEMENT Right 03/31/2019   Procedure: CYSTOSCOPY WITH RETROGRADE  PYELOGRAM, URETEROSCOPY AND STENT PLACEMENT;  Surgeon: Robley Fries, MD;  [facility];  Service: Urology;  Laterality: Right;   CYSTOSCOPY WITH STENT PLACEMENT Right 03/02/2019   Procedure: CYSTOSCOPY WITH STENT PLACEMENT;  Surgeon: Robley Fries, MD;  Location: [place];  Service: Urology;  Laterality: Right;   HOLMIUM LASER APPLICATION Right 1/00/7121   Procedure: HOLMIUM LASER APPLICATION;  Surgeon: Robley Fries, MD;  Location[facility];  Service: Urology;  Laterality: Right;   UPPER GASTROINTESTINAL ENDOSCOPY     Social History   Socioeconomic History   Marital status: Married    Spouse name: phillip   Number of children: 1   Years of education: 12   Highest education level: High school graduate  Occupational History   Occupation: Disabled  Tobacco Use   Smoking status: Never Smoker   Smokeless tobacco: Never Used  Scientific laboratory technician Use: Some days   Substances: Flavoring  Substance and Sexual Activity   Alcohol use: No    Alcohol/week: 0.0 standard drinks   Drug use: No   Sexual activity: Not  Currently  Other Topics Concern   Not on file  Social History Narrative   Lives at home w/ her husband   Left-handed   Caffeine: 1-2 cups of coffee daily   Social Determinants of Health   Financial Resource Strain:    Difficulty of Paying Living Expenses: Not on file  Food Insecurity:    Worried About Charity fundraiser in the Last Year: Not on file   [employer] of Food in the Last Year: Not on file  Transportation Needs:    Lack of Transportation (Medical): Not on file   Lack of Transportation (Non-Medical): Not on file  Physical Activity:    Days of Exercise per Week: Not on file   Minutes of Exercise per Session: Not on file  Stress:    Feeling of Stress : Not on file  Social Connections:    Frequency of Communication with Friends and Family: Not on file   Frequency of Social Gatherings with Friends and Family: Not on file   Attends Religious Services: Not on file   Active Member of Clubs or Organizations: Not on file   Attends Archivist Meetings: Not on file   Marital Status: Not on file  Intimate Partner Violence:    Fear of Current or Ex-Partner: Not on file   Emotionally Abused: Not on file   Physically Abused: Not on file   Sexually Abused: Not on file   Current Outpatient Medications on File Prior to Visit  Medication Sig Dispense Refill   acetaminophen (TYLENOL) 500 MG tablet Take 1,000 mg by mouth every 6 (six) hours as needed for mild pain or headache.     ALPRAZolam (XANAX) 1 MG tablet Take 1 mg by mouth 4 (four) times daily as needed for anxiety.      AMBULATORY NON FORMULARY MEDICATION Squatty Potty x 1 1 each 0   amLODipine (NORVASC) 5 MG tablet Take 5 mg by mouth daily.     BD PEN NEEDLE NANO U/F 32G X 4 MM MISC USE WITH LANTUS AND NOVOLOG PENS 200 each 3   BENICAR 40 MG tablet Take 40 mg by mouth daily.      betamethasone dipropionate 0.05 % cream Apply topically 2 (two) times daily.     cholecalciferol (VITAMIN D3) 25  MCG (1000 UNIT) tablet Take by mouth daily.  Cyanocobalamin (VITAMIN B-12 PO) Take by mouth.     cycloSPORINE (RESTASIS) 0.05 % ophthalmic emulsion 1 drop 2 (two) times daily.     Docusate Sodium (COLACE PO) Take by mouth daily.     ferrous sulfate 325 (65 FE) MG tablet Take 325 mg by mouth in the morning, at noon, and at bedtime.     FIBER PO Take by mouth.     gabapentin (NEURONTIN) 800 MG tablet Take 800 mg by mouth 3 (three) times daily.      glucagon 1 MG injection Inject 1 mg into the muscle once as needed for up to 1 dose. 1 each 12   glucose blood (FREESTYLE LITE) test strip USE TO CHECK BLOOD SUGAR 6 TIMES PER DAY 600 each 2   insulin aspart (NOVOLOG FLEXPEN) 100 UNIT/ML FlexPen Inject 10 Units into the skin daily after breakfast AND 8 Units daily before lunch AND 10 Units daily before supper. Max daily 40 units. (Patient taking differently: Inject 10 -12 Units into the skin daily after breakfast AND 8 -10 Units daily before lunch AND 10-12 Units daily before supper.  201-250=2 units, 251-300=3 units, 301-350=4units, >350= 5 unitsMax daily 40 units.) 45 mL 3   insulin glargine (LANTUS SOLOSTAR) 100 UNIT/ML Solostar Pen Inject 32 Units into the skin daily. 30 mL 1   Insulin Syringes, Disposable, U-100 1 ML MISC To use for insulin injection.. 100 each 1   Lancets (FREESTYLE) lancets USE TO TEST BLOOD SUGAR 4 TO 6 TIMES DAILY AS INSTRUCTED 300 each 6   metFORMIN (GLUCOPHAGE) 500 MG tablet TAKE 1 TABLET TWICE A DAY WITH MEALS 180 tablet 3   nortriptyline (PAMELOR) 50 MG capsule Take 50 mg by mouth at bedtime.      omeprazole (PRILOSEC) 40 MG capsule TAKE 1 CAPSULE TWICE A DAY (Patient taking differently: daily. ) 180 capsule 3   ondansetron (ZOFRAN-ODT) 4 MG disintegrating tablet PLACE 1 TO 2 TABLETS ON TONGUE EVERY 4 TO 6 HOURS AS NEEDED FOR NAUSEA 180 tablet 4   Probiotic CAPS Take 1 capsule by mouth daily. 30 capsule 1   traZODone (DESYREL) 100 MG tablet 1  qhs  May  increse to 2  qhs  After 1 week (Patient taking differently: Take 100 mg by mouth at bedtime. 1  qhs  May increse to 2  qhs  After 1 week) 180 tablet 1   No current facility-administered medications on file prior to visit.   Allergies  Allergen Reactions   Abilify [Aripiprazole]     Patient is intolerant   Phenergan [Promethazine] Diarrhea and Nausea And Vomiting   Reglan [Metoclopramide] Other (See Comments)    Chest pains   Family History  Problem Relation Age of Onset   Hypertension Mother    Bipolar disorder Mother    Cancer Mother    Heart disease Brother    Thyroid disease Sister    Hypertension Sister    Bipolar disorder Sister    Depression Father    Cancer Father    Bipolar disorder Daughter    Bipolar disorder Maternal Aunt    Colon cancer Neg Hx    Colon polyps Neg Hx    Esophageal cancer Neg Hx    Kidney disease Neg Hx    Gallbladder disease Neg Hx    Diabetes Neg Hx    Dementia Neg Hx     PE: BP (!) 150/90    Pulse (!) 105    Ht _0  (1.626 m)  Wt 173 lb (78.5 kg)    SpO2 94%    BMI 29.70 kg/m    Wt Readings from Last 3 Encounters:  08/24/19 173 lb (78.5 kg)  07/08/19 168 lb (76.2 kg)  06/30/19 168 lb (76.2 kg)   Constitutional: Slightly overweight, in NAD Eyes: PERRLA, EOMI, no exophthalmos ENT: moist mucous membranes, no thyromegaly, no cervical lymphadenopathy Cardiovascular: tachycardia, RR, No MRG Respiratory: CTA B Gastrointestinal: abdomen soft, NT, ND, BS+ Musculoskeletal: no deformities, strength intact in all 4 Skin: moist, warm, no rashes Neurological: no tremor with outstretched hands, DTR normal in all 4  ASSESSMENT: 1. LADA, insulin-dependent, uncontrolled, with complications - peripheral neuropathy - gastroparesis?  Component     Latest Ref Rng 07/16/2013  C-Peptide     0.80 - 3.90 ng/mL 1.29  Glucose     70 - 99 mg/dL 183 (H)  Glutamic Acid Decarb Ab     <=1.0 U/mL 11.3 (H)  Pancreatic Islet Cell  Antibody     <5 JDF Units 5 (A)  + anti-pancreatic antibodies >> LADA rather than type 2 DM. She still has a positive C peptide >> still has insulin secretion. For this reason, we continued the Metformin.  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 HL  PLAN:  1.  Complex patient with very difficult to control LADA due to anxiety and depression.  She has alternating low with high blood sugars on a basal-bolus insulin regimen and also half maximal dose of Metformin.  In the past, we had her on insulin pump and she did not do well with this due to anxiety.  Also, we tried adjustable NovoLog doses before meals, but she prefers a fixed regimen.  She had a professional CGM placed before and we adjusted her insulin doses based on the patterns.  -At last visit, sugars were more variable, without a consistent pattern.  Because of her low blood sugars, I advised her to use lower NovoLog doses if she had small meals or only has vegetables for breakfast and dinner.  Otherwise, we did not change the regimen.  HbA1c was only slightly higher, at 5.7%, still excellent. -Since last visit, we decreased the dose of her Lantus to only 30 units, from 32 units at bedtime  -She did have low blood sugar since last visit, per her telephone call from 2 weeks ago: 43, and 53, in p.m.  I was out of the office but my colleague advised her to take a lower dose of insulin with low CHO meals -At last visit I refilled her glucagon prescription -At this visit, she has alternating high and low blood sugars.  She increased the dose of Lantus from 30 back to 32 to 34 units in the last 2 days as she was waking up with sugars in the low 200s in the morning.  I advised her that she may need a lower dose in a period of time when her sugars are lower, but for now, we can continue 30 to 34 units at night.  Regarding her after meal sugars, I believe that they are occasionally lower due to her gastroparesis.  We discussed about trying to move the NovoLog boluses right  after she finishes a meal but if this does not work well for her, to use a premeal bolus for a higher carb meals and a post meal bolus for higher fat meals.  She was not sure what high fat meals mean I gave her examples of these.  Also, we discussed that if  her sugars are lower than 100 before the meal as she prepares to eat a lower carb meal, to only inject 5 to 6 units to avoid sugars in the 30s or 40s after the meals. - I suggested to:  Patient Instructions  Please continue: - Metformin 1000 mg with dinner - Lantus 30-34 units at bedtime - NovoLog:  8-10 units in am 8-10 units with lunch 8-10 units with dinner If your sugars are <100 and you eat a low carb meal, inject only 5-6 units. - Novolog Sliding scale: 201-250: + 2 units 251-300: + 3 units 301-350: + 4 units >350: + 5 units  Try to inject NovoLog right after you finish eating, but if this does not work well, move the boluses at the start of the meal and only keep injecting after the meals if you eat fatty meals (eggs, bacon, sausage, steak, mac and cheese, pizza, [place], [place] food).  Please return in 2 months with your sugar log.   - advised to check sugars at different times of the day - 4x a day, rotating check times - advised for yearly eye exams >> she is UTD - return to clinic in 2 months  2. PN  -Due to diabetes -Continues Neurontin without side effects -I recommended alpha-lipoic acid 600 mg twice a day in the past but she did not start this -At last visit she complained of disequilibrium and I referred her to the [place] office for physical therapy.  She had this before with good results.  She did not call to schedule an appointment yet and I gave her the telephone number to do so now.  She agrees to:.  3. HL -Reviewed latest lipid panel from 02/2019: LDL excellent, as are her triglycerides, but she does have a low HDL: Lab Results  Component Value Date   CHOL 82 02/28/2019   HDL 22 (L) 02/28/2019    LDLCALC 39 02/28/2019   TRIG 105 02/28/2019   CHOLHDL 3.7 02/28/2019  -She is not on a statin  Philemon Kingdom, MD PhD [facility] Endocrinology

## 2019-08-24 NOTE — Patient Instructions (Addendum)
Please continue: - Metformin 1000 mg with dinner - Lantus 30-34 units at bedtime - NovoLog:  8-10 units in am 8-10 units with lunch 8-10 units with dinner If your sugars are <100 and you eat a low carb meal, inject only 5-6 units. - Novolog Sliding scale: 201-250: + 2 units 251-300: + 3 units 301-350: + 4 units >350: + 5 units  Try to inject NovoLog right after you finish eating, but if this does not work well, move the boluses at the start of the meal and only keep injecting after the meals if you eat fatty meals (eggs, bacon, sausage, steak, mac and cheese, pizza, [place], [place] food).  Please return in 2 months with your sugar log.

## 2019-09-11 ENCOUNTER — Other Ambulatory Visit: Payer: Self-pay | Admitting: Gastroenterology

## 2019-09-13 ENCOUNTER — Other Ambulatory Visit: Payer: Self-pay | Admitting: Internal Medicine

## 2019-09-24 ENCOUNTER — Other Ambulatory Visit: Payer: Self-pay | Admitting: Internal Medicine

## 2019-09-24 MED ORDER — BD PEN NEEDLE NANO U/F 32G X 4 MM MISC: 3 refills | Status: DC

## 2019-09-24 NOTE — Telephone Encounter (Signed)
Medication Refill Request  Did you call your pharmacy and request this refill first? Yes  . If patient has not contacted pharmacy first, instruct them to do so for future refills.  . Remind them that contacting the pharmacy for their refill is the quickest method to get the refill.  . Refill policy also stated that it will take anywhere between 24-72 hours to receive the refill.    Name of medication? BD pen needles  Is this a 90 day supply? Same as last time  Name and location of pharmacy?  [facility] - [place], [place] - [address]  [address] [place] [address]  Phone:  [phone] Fax:  [phone]

## 2019-09-24 NOTE — Telephone Encounter (Signed)
RX sent

## 2019-09-30 DIAGNOSIS — Z23 Encounter for immunization: Secondary | ICD-10-CM | POA: Diagnosis not present

## 2019-10-01 ENCOUNTER — Ambulatory Visit: Payer: Medicare Other | Admitting: Internal Medicine

## 2019-10-16 DIAGNOSIS — Z23 Encounter for immunization: Secondary | ICD-10-CM | POA: Diagnosis not present

## 2019-10-26 ENCOUNTER — Telehealth: Payer: Self-pay | Admitting: Internal Medicine

## 2019-10-26 NOTE — Telephone Encounter (Signed)
Pt stated --been eating sanwhiches and sugar -free ice cream causes increase glucose. Pt declined taking steroids or having infection. Pt stated since  moved  Novolog Boluses after meals feeling much better/relief and will move the boluses after meal starting today. Notified pt with the instructions by dr. Elvera Lennox.  Also. Pt husband have questions regarding having reading 280 last night around 9;00 pm and what to do? Please advise.

## 2019-10-26 NOTE — Telephone Encounter (Signed)
    Date Time Reading Notes       10/22/19 800 AM 108   10/22/19 1230 PM 235   10/22/19 1000 PM 277   10/24/19 830 AM 224    10/24/19 1230 PM 250   10/25/19 800 AM 212   10/25/19 1200 PM 191    10/25/19 600 PM 250    10/25/19 1000 PM 266   10/26/19 800 205   10/26/19 1100 289

## 2019-10-26 NOTE — Telephone Encounter (Signed)
[name], Any changes in her regimen since last visit that could have accounted for the higher blood sugars? Does she have any infection? Is she on any steroids? At last visit, I advised her to move the NovoLog boluses after the meals due to her gastroparesis. Is she doing this now? If so, she may need to move the boluses before meals, which was giving her better control I believe.  However, if she is still taking them before the meals, she may need an increase in Lantus. Per my records, she is on the following regimen -and we can increase the Lantus slightly to 38 units and see how she does: - Metformin 1000 mg with dinner - Lantus 30-34 units at bedtime - NovoLog:  8-10 units in am 8-10 units with lunch 8-10 units with dinner If your sugars are <100 and you eat a low carb meal, inject only 5-6 units. - Novolog Sliding scale: 201-250: + 2 units 251-300: + 3 units 301-350: + 4 units >350: + 5 units

## 2019-10-26 NOTE — Telephone Encounter (Signed)
Ok - thank you for obtaining this history from her! She can always correct the high blood sugars using sliding scale.  However, I would like to see if increasing the Lantus would reduce these hyperglycemic values.

## 2019-10-26 NOTE — Telephone Encounter (Signed)
Fyi.

## 2019-10-27 NOTE — Telephone Encounter (Signed)
Notified [name] with Dr. Elvera Lennox instructions. [name] did mention start taking her insulin after meals last night--reading 172.

## 2019-11-02 DIAGNOSIS — N200 Calculus of kidney: Secondary | ICD-10-CM | POA: Diagnosis not present

## 2019-11-09 ENCOUNTER — Ambulatory Visit (INDEPENDENT_AMBULATORY_CARE_PROVIDER_SITE_OTHER): Payer: Medicare Other | Admitting: Internal Medicine

## 2019-11-09 ENCOUNTER — Other Ambulatory Visit: Payer: Self-pay

## 2019-11-09 ENCOUNTER — Encounter: Payer: Self-pay | Admitting: Internal Medicine

## 2019-11-09 VITALS — BP 130/80 | HR 105 | Ht 64.0 in | Wt 176.2 lb

## 2019-11-09 DIAGNOSIS — G63 Polyneuropathy in diseases classified elsewhere: Secondary | ICD-10-CM | POA: Diagnosis not present

## 2019-11-09 DIAGNOSIS — E139 Other specified diabetes mellitus without complications: Secondary | ICD-10-CM

## 2019-11-09 DIAGNOSIS — E785 Hyperlipidemia, unspecified: Secondary | ICD-10-CM | POA: Diagnosis not present

## 2019-11-09 LAB — POCT GLYCOSYLATED HEMOGLOBIN (HGB A1C): Hemoglobin A1C: 5.8 % — AB (ref 4.0–5.6)

## 2019-11-09 NOTE — Patient Instructions (Addendum)
Please continue: - Metformin 1000 mg - move these with dinner - Lantus 30-34 units at bedtime - NovoLog:  8-10 units in am 8-10 units with lunch 8-10 units with dinner If your sugars are <100 and you eat a low carb meal, inject only ~6 units. - Novolog Sliding scale: 201-250: + 2 units 251-300: + 3 units 301-350: + 4 units >350: + 5 units  Try to inject NovoLog right after you finish eating if you eat fatty meals (eggs, bacon, sausage, steak, mac and cheese, pizza, [place], [place] food).  Please return in 3 months with your sugar log.

## 2019-11-09 NOTE — Progress Notes (Signed)
Patient ID: [name], female   DOB: 02-28-1955, 64 y.o.   MRN: [id number]  This visit occurred during the SARS-CoV-2 public health emergency.  Safety protocols were in place, including screening questions prior to the visit, additional usage of staff PPE, and extensive cleaning of exam room while observing appropriate contact time as indicated for disinfecting solutions.   HPI: [name] is a 64 y.o.-year-old female, initially referred by her PCP, Dr. Sharilyn Sites (PA: Collene Mares), returning for f/u for LADA, dx 2005, insulin-dependent since ~2006, controlled, with complications (PN, gastroparesis?).  Last visit 2.5 months ago.  She continues to see neuro.  She has short-term memory loss.  She also continues to have disequilibrium.  She did not start physical therapy-I referred her in 06/2019.  Reviewed history: She was on an Omnipod insulin pump on 08/01/2015. She had severe anxiety and depression and got so overwhelmed with the insulin pump that we had to take her off the pump.   We switched her to a basal-bolus insulin regimen, but she was unable to calculate the insulin doses based on insulin to carb ratio, so now she is using fixed rapid acting insulin doses.  Reviewed HbA1c levels: Lab Results  Component Value Date   HGBA1C 5.7 (A) 06/30/2019   HGBA1C 5.1 02/27/2019   HGBA1C 6.7 (A) 11/05/2018  03/04/2018:  Hba1c calculated from fructosamine is the best in a long time, at 6.84%! 12/19/2017: HbA1c calculated from fructosamine has improved to 7.1%! 09/12/2017: HbA1c  Calculated from the fructosamine is 7.5%, improved from before. 05/07/2017: HbA1c calculated from the fructosamine was 7.7% 02/07/2017: HbA1c calculated from the fructosamine was higher, at 7.7%. 11/07/2016: HbA1c calc. from the fructosamine is a little lower, at 7.25% 08/07/2016: HbA1c calculated from the fructosamine is 7.5% 05/31/2016: HbA1c calculated from fructosamine is better, at 7.25% 12/15/2015: HbA1c  calculated from the fructosamine is higher, 8.4%. 08/24/2015:  HbA1c calculated from fructosamine is 7.4%. 05/24/2015: HbA1c calculated from fructosamine is 6.9%.  In 01/2019, she had a professional CGM: [facility] professional CGM traces between 12/31/2018 at 01/11/2019 were analyzed and the reports will be scanned.  Patient's sugars are variable but consistent patterns were noticed: Sugars are low in the second half of the night and morning, but they increase significantly after breakfast.  Around lunchtime she starts to drop her sugars and they increase but in the less significant fashion after dinner.  Patient was advised to: - reduce the dose of Lantus to 30 units for now.  - If the sugars after breakfast remain high after reducing the Lantus,  increase the dose of rapid acting insulin with breakfast by 2 units. - inject approximately 15 minutes before the meal to see if this helps reducing the sugars after breakfast. -  One of her concerns was about what to do if she is not hungry at lunch.  Reducing the Lantus will help her not drop even if she is not eating, however, if she does feel like she is dropping she may need glucose tablets, 2-4 to keep her sugars up.  She is on: - Metformin 1000 mg with dinner  - Lantus  34 >> 30 >> 32-34 >> 32-34 units at bedtime - NovoLog:  10-12 >> 10 >> 8-10 units in am 8-10 units with lunch 10-12 >> 8-10 units with dinner - Novolog Sliding scale: 201-250: + 2 units 251-300: + 3 units 301-350: + 4 units >350: + 5 units  Pt checks her sugars more than more than 4  times a day per review of her detailed log: - am: 129-270 >> 49, 63-242 >> 44, 66-263 >> 112-237, 289 - 2h after b'fast: 32, 267 >> 57, 157 >> 345 >> 43, 51 >> 58, 111 - before lunch: 50, 53-268, 278, 291 >> 45-228, 232 >> 85-289, 296 - 2h after lunch:  63, 121 >> n/c >> 94 >> n/c >> 43 >> n/c - before dinner: 37, 41, 44, 70-210, 345 >> 47-208, 292 >> 51, 79-283, 340, 355 - 2h after  dinner: 37, 44-222, 260, 300 >> n/c >> 45 - bedtime: 52-259 >> 102-250 >> 59-219, 310 >> 40, 78-263, 376 - at night: 82, 250 >> 90 >> n/c >> 165 >> n/c >> 32, 90 >> 51, 88, 134 Lowest sugar was 35 >> 37 >> 32 >> 40; she has hypoglycemia awareness in the 50s Highest sugar was   270 >> 345 >> 355.  She started walking 3000 steps daily.   No CKD, last BUN/creatinine:  Lab Results  Component Value Date   BUN 13 03/31/2019   CREATININE 0.60 03/31/2019   No microalbuminuria: Lab Results  Component Value Date   MICRALBCREAT 5.4 11/07/2016    + Dyslipidemia; lipids: Lab Results  Component Value Date   CHOL 82 02/28/2019   HDL 22 (L) 02/28/2019   LDLCALC 39 02/28/2019   TRIG 105 02/28/2019   CHOLHDL 3.7 02/28/2019  Not on a statin.  -Last eye exam was on 06/03/2019: No DR reportedly -[facility]: Dr. Earley Favor.   -She has numbness and tingling in her feet.  She has occasional pain.  She continues on Neurontin. She sees a podiatrist [facility]).  She had 2 toenails removed in the past due to fungal infection.  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  She has a diagnosis of Parkinson's disease.  She also has bipolar disease. Also: GERD, HTN, fibromyalgia >> on Neurontin.  Latest TSH was normal: Lab Results  Component Value Date   TSH 0.45 12/16/2018   ROS: Constitutional: + weight gain/no weight loss, no fatigue, no subjective hyperthermia, no subjective hypothermia Eyes: no blurry vision, no xerophthalmia ENT: no sore throat, no nodules palpated in neck, no dysphagia, no odynophagia, no hoarseness Cardiovascular: no CP/no SOB/no palpitations/no leg swelling Respiratory: no cough/no SOB/no wheezing Gastrointestinal: no N/no V/no D/no C/no acid reflux Musculoskeletal: no muscle aches/+ joint aches (left shoulder) Skin: no rashes, no hair loss Neurological: no tremors/+ numbness/+ tingling/no dizziness, + disequilibrium  I reviewed pt's medications,  allergies, PMH, social hx, family hx, and changes were documented in the history of present illness. Otherwise, unchanged from my initial visit note.  Past Medical History:  Diagnosis Date  . Anxiety   . Bipolar 1 disorder ([place])   . Breast density 06/25/2012   Right breast density, will get mammogram and [place]  . Depression   . Diabetes mellitus without complication ([place])    type 1  . Duodenitis   . GERD (gastroesophageal reflux disease)   . History of kidney stones   . HTN (hypertension)   . Hyperlipidemia   . IBS (irritable bowel syndrome)   . Neuropathy   . Sepsis ([place]) 02/27/2019   Past Surgical History:  Procedure Laterality Date  . ABDOMINAL HYSTERECTOMY     partial  . CHOLECYSTECTOMY    . COLONOSCOPY    . CYSTOSCOPY WITH RETROGRADE PYELOGRAM, URETEROSCOPY AND STENT PLACEMENT Right 03/31/2019   Procedure: CYSTOSCOPY WITH RETROGRADE PYELOGRAM, URETEROSCOPY AND STENT PLACEMENT;  Surgeon: Robley Fries, MD;  Location: [facility];  Service: Urology;  Laterality: Right;  . CYSTOSCOPY WITH STENT PLACEMENT Right 03/02/2019   Procedure: CYSTOSCOPY WITH STENT PLACEMENT;  Surgeon: Robley Fries, MD;  Location: [place];  Service: Urology;  Laterality: Right;  . HOLMIUM LASER APPLICATION Right 2/68/3419   Procedure: HOLMIUM LASER APPLICATION;  Surgeon: Robley Fries, MD;  Location: [facility];  Service: Urology;  Laterality: Right;  . UPPER GASTROINTESTINAL ENDOSCOPY     Social History   Socioeconomic History  . Marital status: Married    Spouse name: phillip  . Number of children: 1  . Years of education: 89  . Highest education level: High school graduate  Occupational History  . Occupation: Disabled  Tobacco Use  . Smoking status: Never Smoker  . Smokeless tobacco: Never Used  Vaping Use  . Vaping Use: Some days  . Substances: Flavoring  Substance and Sexual Activity  . Alcohol use: No    Alcohol/week: 0.0 standard drinks  . Drug  use: No  . Sexual activity: Not Currently  Other Topics Concern  . Not on file  Social History Narrative   Lives at home w/ her husband   Left-handed   Caffeine: 1-2 cups of coffee daily   Social Determinants of Health   Financial Resource Strain:   . Difficulty of Paying Living Expenses: Not on file  Food Insecurity:   . Worried About Charity fundraiser in the Last Year: Not on file  . Ran Out of Food in the Last Year: Not on file  Transportation Needs:   . Lack of Transportation (Medical): Not on file  . Lack of Transportation (Non-Medical): Not on file  Physical Activity:   . Days of Exercise per Week: Not on file  . Minutes of Exercise per Session: Not on file  Stress:   . Feeling of Stress : Not on file  Social Connections:   . Frequency of Communication with Friends and Family: Not on file  . Frequency of Social Gatherings with Friends and Family: Not on file  . Attends Religious Services: Not on file  . Active Member of Clubs or Organizations: Not on file  . Attends Archivist Meetings: Not on file  . Marital Status: Not on file  Intimate Partner Violence:   . Fear of Current or Ex-Partner: Not on file  . Emotionally Abused: Not on file  . Physically Abused: Not on file  . Sexually Abused: Not on file   Current Outpatient Medications on File Prior to Visit  Medication Sig Dispense Refill  . acetaminophen (TYLENOL) 500 MG tablet Take 1,000 mg by mouth every 6 (six) hours as needed for mild pain or headache.    . ALPRAZolam (XANAX) 1 MG tablet Take 1 mg by mouth 4 (four) times daily as needed for anxiety.     . AMBULATORY NON FORMULARY MEDICATION Squatty Potty x 1 1 each 0  . amLODipine (NORVASC) 5 MG tablet Take 5 mg by mouth daily.    . BD PEN NEEDLE NANO U/F 32G X 4 MM MISC Use with insulin pen 4 times a day 400 each 3  . BENICAR 40 MG tablet Take 40 mg by mouth daily.     . betamethasone dipropionate 0.05 % cream Apply topically 2 (two) times daily.     . cholecalciferol (VITAMIN D3) 25 MCG (1000 UNIT) tablet Take by mouth daily.     . Cyanocobalamin (VITAMIN  B-12 PO) Take by mouth.    . cycloSPORINE (RESTASIS) 0.05 % ophthalmic emulsion 1 drop 2 (two) times daily.    Mariane Baumgarten Sodium (COLACE PO) Take by mouth daily.    . ferrous sulfate 325 (65 FE) MG tablet Take 325 mg by mouth in the morning, at noon, and at bedtime.    Marland Kitchen FIBER PO Take by mouth.    . gabapentin (NEURONTIN) 800 MG tablet Take 800 mg by mouth 3 (three) times daily.     Marland Kitchen glucagon 1 MG injection Inject 1 mg into the muscle once as needed for up to 1 dose. 1 each 12  . glucose blood (FREESTYLE LITE) test strip USE TO CHECK BLOOD SUGAR 6 TIMES PER DAY 600 each 2  . insulin aspart (NOVOLOG FLEXPEN) 100 UNIT/ML FlexPen Inject 10 Units into the skin daily after breakfast AND 8 Units daily before lunch AND 10 Units daily before supper. Max daily 40 units. (Patient taking differently: Inject 10 -12 Units into the skin daily after breakfast AND 8 -10 Units daily before lunch AND 10-12 Units daily before supper.  201-250=2 units, 251-300=3 units, 301-350=4units, >350= 5 unitsMax daily 40 units.) 45 mL 3  . insulin glargine (LANTUS SOLOSTAR) 100 UNIT/ML Solostar Pen Inject 32 Units into the skin daily. 30 mL 1  . Insulin Syringes, Disposable, U-100 1 ML MISC To use for insulin injection.. 100 each 1  . Lancets (FREESTYLE) lancets USE TO TEST BLOOD SUGAR 4 TO 6 TIMES DAILY AS INSTRUCTED 300 each 6  . metFORMIN (GLUCOPHAGE) 500 MG tablet TAKE 1 TABLET TWICE A DAY WITH MEALS 180 tablet 3  . nortriptyline (PAMELOR) 50 MG capsule Take 50 mg by mouth at bedtime.     Marland Kitchen omeprazole (PRILOSEC) 40 MG capsule TAKE 1 CAPSULE TWICE A DAY (Patient taking differently: daily. ) 180 capsule 3  . ondansetron (ZOFRAN-ODT) 4 MG disintegrating tablet PLACE 1 TO 2 TABLETS ON TONGUE EVERY 4 TO 6 HOURS AS NEEDED FOR NAUSEA 180 tablet 4  . OSCIMIN 0.125 MG SUBL DISSOLVE 1 TABLET UNDER THE TONGUE EVERY 4 HOURS AS  NEEDED (NEED APPOINTMENT) 360 tablet 0  . Probiotic CAPS Take 1 capsule by mouth daily. 30 capsule 1  . traZODone (DESYREL) 100 MG tablet 1  qhs  May increse to 2  qhs  After 1 week (Patient taking differently: Take 100 mg by mouth at bedtime. 1  qhs  May increse to 2  qhs  After 1 week) 180 tablet 1   No current facility-administered medications on file prior to visit.   Allergies  Allergen Reactions  . Abilify [Aripiprazole]     Patient is intolerant  . Phenergan [Promethazine] Diarrhea and Nausea And Vomiting  . Reglan [Metoclopramide] Other (See Comments)    Chest pains   Family History  Problem Relation Age of Onset  . Hypertension Mother   . Bipolar disorder Mother   . Cancer Mother   . Heart disease Brother   . Thyroid disease Sister   . Hypertension Sister   . Bipolar disorder Sister   . Depression Father   . Cancer Father   . Bipolar disorder Daughter   . Bipolar disorder Maternal Aunt   . Colon cancer Neg Hx   . Colon polyps Neg Hx   . Esophageal cancer Neg Hx   . Kidney disease Neg Hx   . Gallbladder disease Neg Hx   . Diabetes Neg Hx   . Dementia Neg Hx  PE: BP 130/80   Pulse (!) 105   Ht _0  (1.626 m)   Wt 176 lb 3.2 oz (79.9 kg)   SpO2 97%   BMI 30.24 kg/m    Wt Readings from Last 3 Encounters:  11/09/19 176 lb 3.2 oz (79.9 kg)  08/24/19 173 lb (78.5 kg)  07/08/19 168 lb (76.2 kg)   Constitutional: + Slightly overweight, in NAD Eyes: PERRLA, EOMI, no exophthalmos ENT: moist mucous membranes, no thyromegaly, no cervical lymphadenopathy Cardiovascular: RRR, No MRG Respiratory: CTA B Gastrointestinal: abdomen soft, NT, ND, BS+ Musculoskeletal: no deformities, strength intact in all 4 Skin: moist, warm, no rashes, + many tatoos Neurological: no tremor with outstretched hands, DTR normal in all 4  ASSESSMENT: 1. 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  Component     Latest Ref Rng 07/16/2013  C-Peptide     0.80 - 3.90 ng/mL 1.29  Glucose     70 - 99 mg/dL 183 (H)  Glutamic Acid Decarb Ab     <=1.0 U/mL 11.3 (H)  Pancreatic Islet Cell Antibody     <5 JDF Units 5 (A)  + anti-pancreatic antibodies >> LADA rather than type 2 DM. She still has a positive C peptide >> still has insulin secretion. For this reason, we continued the Metformin.  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 HL  PLAN:  1.  Complex patient with very difficult to control LADA due to depression and anxiety.  She has poor diabetes control with fluctuating blood glucose levels from the 30s to 300s. In the past, we had her on insulin pump and she did not do well with this due to anxiety.  Also, we tried adjustable NovoLog doses before meals, but she prefers a fixed regimen.  She had a professional CGM placed before and we adjusted her insulin doses based on the patterns.  -She has a glucagon kit at home -At last visit, her sugars were again fluctuating, on 32 to 34 units of Lantus at that time, dose increased after she was waking up with sugars in the 200s in the morning.  We did discuss about possibly needing lower doses when the sugars start to improve.  Regarding her postprandial blood sugars, they are usually lower due to her gastroparesis.  We discussed about trying to move the NovoLog doses right after she finishes a meal but if this did not work well for her to move it back to before meals and may need a postprandial bolus only for higher fat meals.  We also discussed that if her sugars are lower than 100 before a meal as she was preparing to eat a lower carb meal to only inject 5 to 6 units to avoid sugars in the 30s or 40s after the meals. -Since then, she contacted me with high blood sugars after she started to take the insulin after meals.  We moved it back to before meals. -At today's visit, per review of her detailed blood sugar log, her sugars are still fluctuating, with an  improvement last month and then again in the last 2 days, after she restarted walking.  However, she had 1 week at the end of October when sugars are quite high.  She also has occasional low blood sugars after meals and sometimes even before meals that are quite low.  Unfortunately, we are very limited in determining what causes these blood sugars since she is doing a good job  trying to adjust the dose of insulin based on the foods that she eats. -We did discuss about moving Metformin with dinner since she is taking it at bedtime, in an effort to improve her morning sugars.  I encouraged her to continue to vary the Lantus dose between 30 and 34 units depending on her blood sugars, since this has been working better for her than trying to give her a fixed dose.  We also discussed about trying to inject NovoLog after the meals only for high fat meals - I suggested to:  Patient Instructions  Please continue: - Metformin 1000 mg - move these with dinner - Lantus 30-34 units at bedtime - NovoLog:  8-10 units in am 8-10 units with lunch 8-10 units with dinner If your sugars are <100 and you eat a low carb meal, inject only ~6 units. - Novolog Sliding scale: 201-250: + 2 units 251-300: + 3 units 301-350: + 4 units >350: + 5 units  Try to inject NovoLog right after you finish eating if you eat fatty meals (eggs, bacon, sausage, steak, mac and cheese, pizza, [place], [place] food).  Please return in 3 months with your sugar log.   - we checked her HbA1c: 5.8% (slightly higher)-likely not very accurate for her - advised to check sugars at different times of the day - 4-6x a day, rotating check times - advised for yearly eye exams >> she is UTD - return to clinic in 3 months  2. PN  -Related to diabetes -Continues Neurontin without side effects -I did recommend alpha-lipoic acid 600 mg twice a day in the past but she did not start this -This summer I referred her to physical therapy at [facility] office, upon her request, since she had this before with good results.  At last visit I gave her again the telephone numbers at last visit but she still did not contact.  She tells me that she is planning to contact them.  3. HL -Reviewed latest lipid panel from 02/2019: Tractions are at goal with exception of a low HDL: Lab Results  Component Value Date   CHOL 82 02/28/2019   HDL 22 (L) 02/28/2019   LDLCALC 39 02/28/2019   TRIG 105 02/28/2019   CHOLHDL 3.7 02/28/2019  -She is not on a statin  Philemon Kingdom, MD PhD [facility]

## 2019-11-11 DIAGNOSIS — F419 Anxiety disorder, unspecified: Secondary | ICD-10-CM | POA: Diagnosis not present

## 2019-11-11 DIAGNOSIS — I10 Essential (primary) hypertension: Secondary | ICD-10-CM | POA: Diagnosis not present

## 2019-11-11 DIAGNOSIS — Z6829 Body mass index (BMI) 29.0-29.9, adult: Secondary | ICD-10-CM | POA: Diagnosis not present

## 2019-11-11 DIAGNOSIS — E1140 Type 2 diabetes mellitus with diabetic neuropathy, unspecified: Secondary | ICD-10-CM | POA: Diagnosis not present

## 2019-11-11 DIAGNOSIS — R109 Unspecified abdominal pain: Secondary | ICD-10-CM | POA: Diagnosis not present

## 2019-11-11 DIAGNOSIS — K219 Gastro-esophageal reflux disease without esophagitis: Secondary | ICD-10-CM | POA: Diagnosis not present

## 2019-11-16 ENCOUNTER — Other Ambulatory Visit: Payer: Self-pay | Admitting: *Deleted

## 2019-11-16 ENCOUNTER — Telehealth: Payer: Self-pay

## 2019-11-16 MED ORDER — LANTUS SOLOSTAR 100 UNIT/ML ~~LOC~~ SOPN: 32.0000 [IU] | PEN_INJECTOR | Freq: Every day | SUBCUTANEOUS | 1 refills | Status: DC

## 2019-11-16 MED ORDER — NOVOLOG FLEXPEN 100 UNIT/ML ~~LOC~~ SOPN: PEN_INJECTOR | SUBCUTANEOUS | 3 refills | Status: DC

## 2019-11-16 NOTE — Telephone Encounter (Signed)
New message     1. Which medications need to be refilled? (please list name of each medication and dose if known)   insulin aspart (NOVOLOG FLEXPEN) 100 UNIT/ML FlexPen  insulin glargine (LANTUS SOLOSTAR) 100 UNIT/ML Solostar Pen  2. Which pharmacy/location (including street and city if local pharmacy) is medication to be sent to?[facility] mail order

## 2019-11-16 NOTE — Telephone Encounter (Signed)
Rx sent 

## 2019-11-19 ENCOUNTER — Telehealth: Payer: Self-pay | Admitting: Internal Medicine

## 2019-11-19 NOTE — Telephone Encounter (Signed)
Please see below.

## 2019-11-19 NOTE — Telephone Encounter (Signed)
Patient called to advise that [facility] will be resending the RX for Novolog for dosage and direction corrections.  Patient states that everyday in the last 2 weeks the morning fasting blood glucose levels have been between 200-250 every day.  Wants to know if there is anything that can be done

## 2019-11-19 NOTE — Telephone Encounter (Signed)
Spoken to patient's husband and notified Dr Charlean Sanfilippo comments. Verbalized understanding.

## 2019-11-19 NOTE — Telephone Encounter (Signed)
She can try to inch up Levemir to 36 units at night.  Also, if she did not try this yet, try to take the entire Metformin dose with dinner.

## 2020-02-08 ENCOUNTER — Telehealth: Payer: Self-pay | Admitting: Internal Medicine

## 2020-02-08 NOTE — Telephone Encounter (Signed)
Patient requests to be called at ph# [phone] re: Patient has had an upset/nervous stomach, therefore has no to little appetite for approx. 4 days and would like to know what she can eat considering her diabetes.

## 2020-02-08 NOTE — Telephone Encounter (Signed)
Any advice?

## 2020-02-08 NOTE — Telephone Encounter (Signed)
T, no particular diet for diabetes itself, but need to have an increase fiber and decrease in fatty foods and concentrated sweets. Would she want me to refer her to nutrition?

## 2020-02-09 NOTE — Telephone Encounter (Signed)
Called and spoke with pt's husband Aneta Mins and discussed options available Phill requested we call and speak with Britta Mccreedy directly regarding options. Called and left a message on pt's home phone for pt to call back to discuss options.

## 2020-02-12 ENCOUNTER — Ambulatory Visit: Payer: Medicare Other | Admitting: Internal Medicine

## 2020-02-12 DIAGNOSIS — M545 Low back pain, unspecified: Secondary | ICD-10-CM | POA: Diagnosis not present

## 2020-02-12 DIAGNOSIS — R8279 Other abnormal findings on microbiological examination of urine: Secondary | ICD-10-CM | POA: Diagnosis not present

## 2020-02-15 ENCOUNTER — Telehealth: Payer: Self-pay | Admitting: Internal Medicine

## 2020-02-15 NOTE — Telephone Encounter (Signed)
error 

## 2020-02-15 NOTE — Telephone Encounter (Signed)
Called and left a detailed message for pt advising her to increase lantus by 4 units and meal time insulin by 2 units. Left call back number in case of questions or concerns.

## 2020-02-15 NOTE — Telephone Encounter (Signed)
Pt called to let Dr know her numbers for the other days  2nd day: 316, 232, 216, 397   3rd day: 316, 222, 216, 397  She said she is starting over today and wanted to let Dr know she has had a bad upset stomach.

## 2020-02-15 NOTE — Telephone Encounter (Signed)
Please advise 

## 2020-02-15 NOTE — Telephone Encounter (Signed)
Patient requests to be called at ph# [phone] re: Patients blood sugars have been running high 02/13/20=347, 165, 392 & 290 02/14/20 =316, 223, 216 & 397

## 2020-02-15 NOTE — Telephone Encounter (Signed)
She may need to increase Lantus by 4 units and also the mealtime insulin by 2 units with each meal but she has to be careful to back off the doses again when her GI symptoms improved.

## 2020-02-23 ENCOUNTER — Other Ambulatory Visit (HOSPITAL_COMMUNITY): Payer: Self-pay | Admitting: Internal Medicine

## 2020-02-23 DIAGNOSIS — R109 Unspecified abdominal pain: Secondary | ICD-10-CM

## 2020-02-25 ENCOUNTER — Encounter (INDEPENDENT_AMBULATORY_CARE_PROVIDER_SITE_OTHER): Payer: Self-pay | Admitting: *Deleted

## 2020-02-25 ENCOUNTER — Other Ambulatory Visit: Payer: Self-pay

## 2020-02-25 ENCOUNTER — Ambulatory Visit (HOSPITAL_COMMUNITY)
Admission: RE | Admit: 2020-02-25 | Discharge: 2020-02-25 | Disposition: A | Discharge status: Home / Self Care | Payer: Medicare Other | Source: Ambulatory Visit | Attending: Internal Medicine | Admitting: Internal Medicine

## 2020-02-25 DIAGNOSIS — R109 Unspecified abdominal pain: Secondary | ICD-10-CM | POA: Diagnosis not present

## 2020-02-25 DIAGNOSIS — R1032 Left lower quadrant pain: Secondary | ICD-10-CM | POA: Diagnosis not present

## 2020-03-02 ENCOUNTER — Telehealth: Payer: Self-pay | Admitting: Internal Medicine

## 2020-03-02 NOTE — Telephone Encounter (Signed)
Called and left a message for pt to call back for the following information: Lantus 30-34 units at bedtime - NovoLog:  8-10 units in am 8-10 units with lunch 8-10 units with dinner If your sugars are <100 and you eat a low carb meal, inject only ~6 units. - Novolog Sliding scale: 201-250: + 2 units 251-300: + 3 units 301-350: + 4 units >350: + 5 units   Try to inject NovoLog right after you finish eating if you eat fatty meals (eggs, bacon, sausage, steak, mac and cheese, pizza, [place], [place] food). Awaiting a call back.

## 2020-03-02 NOTE — Telephone Encounter (Signed)
Patient called to request a new sliding scale and instructions for taking her insulin.  She lost her original sliding scale, etc and doesn't know how to proceed.  Call back umber is [phone]

## 2020-03-03 ENCOUNTER — Encounter (HOSPITAL_COMMUNITY): Payer: Self-pay | Admitting: Internal Medicine

## 2020-03-03 ENCOUNTER — Emergency Department (HOSPITAL_COMMUNITY): Payer: Medicare Other

## 2020-03-03 ENCOUNTER — Other Ambulatory Visit: Payer: Self-pay

## 2020-03-03 ENCOUNTER — Inpatient Hospital Stay (HOSPITAL_COMMUNITY)
Admission: EM | Admit: 2020-03-03 | Discharge: 2020-03-06 | DRG: 871 | Disposition: A | Discharge status: Home Health | Payer: Medicare Other | Attending: Internal Medicine | Admitting: Internal Medicine

## 2020-03-03 DIAGNOSIS — A419 Sepsis, unspecified organism: Secondary | ICD-10-CM | POA: Diagnosis not present

## 2020-03-03 DIAGNOSIS — T43595A Adverse effect of other antipsychotics and neuroleptics, initial encounter: Secondary | ICD-10-CM | POA: Diagnosis present

## 2020-03-03 DIAGNOSIS — Z79899 Other long term (current) drug therapy: Secondary | ICD-10-CM

## 2020-03-03 DIAGNOSIS — R000 Tachycardia, unspecified: Secondary | ICD-10-CM | POA: Diagnosis not present

## 2020-03-03 DIAGNOSIS — R55 Syncope and collapse: Secondary | ICD-10-CM | POA: Diagnosis not present

## 2020-03-03 DIAGNOSIS — R4781 Slurred speech: Secondary | ICD-10-CM | POA: Diagnosis present

## 2020-03-03 DIAGNOSIS — J150 Pneumonia due to Klebsiella pneumoniae: Secondary | ICD-10-CM | POA: Diagnosis not present

## 2020-03-03 DIAGNOSIS — Z8249 Family history of ischemic heart disease and other diseases of the circulatory system: Secondary | ICD-10-CM

## 2020-03-03 DIAGNOSIS — E1342 Other specified diabetes mellitus with diabetic polyneuropathy: Secondary | ICD-10-CM | POA: Diagnosis not present

## 2020-03-03 DIAGNOSIS — E785 Hyperlipidemia, unspecified: Secondary | ICD-10-CM | POA: Diagnosis present

## 2020-03-03 DIAGNOSIS — R6520 Severe sepsis without septic shock: Secondary | ICD-10-CM | POA: Diagnosis not present

## 2020-03-03 DIAGNOSIS — E162 Hypoglycemia, unspecified: Secondary | ICD-10-CM

## 2020-03-03 DIAGNOSIS — Z20822 Contact with and (suspected) exposure to covid-19: Secondary | ICD-10-CM | POA: Diagnosis present

## 2020-03-03 DIAGNOSIS — E861 Hypovolemia: Secondary | ICD-10-CM | POA: Diagnosis not present

## 2020-03-03 DIAGNOSIS — R410 Disorientation, unspecified: Secondary | ICD-10-CM

## 2020-03-03 DIAGNOSIS — I9589 Other hypotension: Secondary | ICD-10-CM

## 2020-03-03 DIAGNOSIS — I959 Hypotension, unspecified: Secondary | ICD-10-CM | POA: Diagnosis not present

## 2020-03-03 DIAGNOSIS — K219 Gastro-esophageal reflux disease without esophagitis: Secondary | ICD-10-CM

## 2020-03-03 DIAGNOSIS — R6521 Severe sepsis with septic shock: Secondary | ICD-10-CM | POA: Diagnosis not present

## 2020-03-03 DIAGNOSIS — E139 Other specified diabetes mellitus without complications: Secondary | ICD-10-CM | POA: Diagnosis not present

## 2020-03-03 DIAGNOSIS — Z7984 Long term (current) use of oral hypoglycemic drugs: Secondary | ICD-10-CM

## 2020-03-03 DIAGNOSIS — G928 Other toxic encephalopathy: Secondary | ICD-10-CM | POA: Diagnosis not present

## 2020-03-03 DIAGNOSIS — E11649 Type 2 diabetes mellitus with hypoglycemia without coma: Secondary | ICD-10-CM | POA: Diagnosis present

## 2020-03-03 DIAGNOSIS — I10 Essential (primary) hypertension: Secondary | ICD-10-CM

## 2020-03-03 DIAGNOSIS — K5909 Other constipation: Secondary | ICD-10-CM

## 2020-03-03 DIAGNOSIS — N179 Acute kidney failure, unspecified: Secondary | ICD-10-CM | POA: Diagnosis not present

## 2020-03-03 DIAGNOSIS — M6281 Muscle weakness (generalized): Secondary | ICD-10-CM | POA: Diagnosis present

## 2020-03-03 DIAGNOSIS — E1142 Type 2 diabetes mellitus with diabetic polyneuropathy: Secondary | ICD-10-CM | POA: Diagnosis present

## 2020-03-03 DIAGNOSIS — Z9049 Acquired absence of other specified parts of digestive tract: Secondary | ICD-10-CM

## 2020-03-03 DIAGNOSIS — R404 Transient alteration of awareness: Secondary | ICD-10-CM | POA: Diagnosis not present

## 2020-03-03 DIAGNOSIS — R531 Weakness: Secondary | ICD-10-CM | POA: Diagnosis not present

## 2020-03-03 DIAGNOSIS — Z87442 Personal history of urinary calculi: Secondary | ICD-10-CM

## 2020-03-03 DIAGNOSIS — B9689 Other specified bacterial agents as the cause of diseases classified elsewhere: Secondary | ICD-10-CM | POA: Diagnosis present

## 2020-03-03 DIAGNOSIS — Z818 Family history of other mental and behavioral disorders: Secondary | ICD-10-CM

## 2020-03-03 DIAGNOSIS — N12 Tubulo-interstitial nephritis, not specified as acute or chronic: Secondary | ICD-10-CM | POA: Diagnosis present

## 2020-03-03 DIAGNOSIS — F319 Bipolar disorder, unspecified: Secondary | ICD-10-CM

## 2020-03-03 DIAGNOSIS — Z888 Allergy status to other drugs, medicaments and biological substances status: Secondary | ICD-10-CM

## 2020-03-03 DIAGNOSIS — E872 Acidosis, unspecified: Secondary | ICD-10-CM | POA: Diagnosis present

## 2020-03-03 DIAGNOSIS — Z90710 Acquired absence of both cervix and uterus: Secondary | ICD-10-CM

## 2020-03-03 DIAGNOSIS — R0902 Hypoxemia: Secondary | ICD-10-CM | POA: Diagnosis not present

## 2020-03-03 DIAGNOSIS — K581 Irritable bowel syndrome with constipation: Secondary | ICD-10-CM | POA: Diagnosis present

## 2020-03-03 DIAGNOSIS — T56891A Toxic effect of other metals, accidental (unintentional), initial encounter: Secondary | ICD-10-CM | POA: Diagnosis present

## 2020-03-03 DIAGNOSIS — E860 Dehydration: Secondary | ICD-10-CM | POA: Diagnosis present

## 2020-03-03 DIAGNOSIS — G9341 Metabolic encephalopathy: Secondary | ICD-10-CM | POA: Diagnosis present

## 2020-03-03 DIAGNOSIS — Z794 Long term (current) use of insulin: Secondary | ICD-10-CM

## 2020-03-03 DIAGNOSIS — F419 Anxiety disorder, unspecified: Secondary | ICD-10-CM | POA: Diagnosis present

## 2020-03-03 LAB — RENAL FUNCTION PANEL
Albumin: 2.9 g/dL — ABNORMAL LOW (ref 3.5–5.0)
Anion gap: 10 (ref 5–15)
BUN: 19 mg/dL (ref 8–23)
CO2: 19 mmol/L — ABNORMAL LOW (ref 22–32)
Calcium: 6.7 mg/dL — ABNORMAL LOW (ref 8.9–10.3)
Chloride: 113 mmol/L — ABNORMAL HIGH (ref 98–111)
Creatinine, Ser: 0.84 mg/dL (ref 0.44–1.00)
GFR, Estimated: >60 mL/min (ref >60)
Glucose, Bld: 117 mg/dL — ABNORMAL HIGH (ref 70–99)
Phosphorus: 2.2 mg/dL — ABNORMAL LOW (ref 2.5–4.6)
Potassium: 3 mmol/L — ABNORMAL LOW (ref 3.5–5.1)
Sodium: 142 mmol/L (ref 135–145)

## 2020-03-03 LAB — COMPREHENSIVE METABOLIC PANEL
ALT: 17 U/L (ref 0–44)
AST: 20 U/L (ref 15–41)
Albumin: 3.2 g/dL — ABNORMAL LOW (ref 3.5–5.0)
Alkaline Phosphatase: 61 U/L (ref 38–126)
Anion gap: 10 (ref 5–15)
BUN: 28 mg/dL — ABNORMAL HIGH (ref 8–23)
CO2: 24 mmol/L (ref 22–32)
Calcium: 8.2 mg/dL — ABNORMAL LOW (ref 8.9–10.3)
Chloride: 106 mmol/L (ref 98–111)
Creatinine, Ser: 1.74 mg/dL — ABNORMAL HIGH (ref 0.44–1.00)
GFR, Estimated: 32 mL/min — ABNORMAL LOW (ref >60)
Glucose, Bld: 44 mg/dL — CL (ref 70–99)
Potassium: 3.6 mmol/L (ref 3.5–5.1)
Sodium: 140 mmol/L (ref 135–145)
Total Bilirubin: 0.6 mg/dL (ref 0.3–1.2)
Total Protein: 5.4 g/dL — ABNORMAL LOW (ref 6.5–8.1)

## 2020-03-03 LAB — CBG MONITORING, ED
Glucose-Capillary: 168 mg/dL — ABNORMAL HIGH (ref 70–99)
Glucose-Capillary: 43 mg/dL — CL (ref 70–99)
Glucose-Capillary: 94 mg/dL (ref 70–99)
Glucose-Capillary: 94 mg/dL (ref 70–99)

## 2020-03-03 LAB — URINALYSIS, ROUTINE W REFLEX MICROSCOPIC
Bilirubin Urine: NEGATIVE
Glucose, UA: NEGATIVE mg/dL
Hgb urine dipstick: NEGATIVE
Ketones, ur: NEGATIVE mg/dL
Nitrite: NEGATIVE
Protein, ur: NEGATIVE mg/dL
Specific Gravity, Urine: 1.005 (ref 1.005–1.030)
pH: 5 (ref 5.0–8.0)

## 2020-03-03 LAB — CBC WITH DIFFERENTIAL/PLATELET
Abs Immature Granulocytes: 0.02 10*3/uL (ref 0.00–0.07)
Basophils Absolute: 0 10*3/uL (ref 0.0–0.1)
Basophils Relative: 0 %
Eosinophils Absolute: 0.1 10*3/uL (ref 0.0–0.5)
Eosinophils Relative: 1 %
HCT: 33.2 % — ABNORMAL LOW (ref 36.0–46.0)
Hemoglobin: 10.5 g/dL — ABNORMAL LOW (ref 12.0–15.0)
Immature Granulocytes: 0 %
Lymphocytes Relative: 22 %
Lymphs Abs: 1.7 10*3/uL (ref 0.7–4.0)
MCH: 30.2 pg (ref 26.0–34.0)
MCHC: 31.6 g/dL (ref 30.0–36.0)
MCV: 95.4 fL (ref 80.0–100.0)
Monocytes Absolute: 1 10*3/uL (ref 0.1–1.0)
Monocytes Relative: 12 %
Neutro Abs: 5.1 10*3/uL (ref 1.7–7.7)
Neutrophils Relative %: 65 %
Platelets: 182 10*3/uL (ref 150–400)
RBC: 3.48 MIL/uL — ABNORMAL LOW (ref 3.87–5.11)
RDW: 13.3 % (ref 11.5–15.5)
WBC: 8 10*3/uL (ref 4.0–10.5)
nRBC: 0 % (ref 0.0–0.2)

## 2020-03-03 LAB — RAPID URINE DRUG SCREEN, HOSP PERFORMED
Amphetamines: NOT DETECTED
Barbiturates: NOT DETECTED
Benzodiazepines: POSITIVE — AB
Cocaine: NOT DETECTED
Opiates: NOT DETECTED
Tetrahydrocannabinol: POSITIVE — AB

## 2020-03-03 LAB — TSH: TSH: 0.533 u[IU]/mL (ref 0.350–4.500)

## 2020-03-03 LAB — AMMONIA: Ammonia: 31 umol/L (ref 9–35)

## 2020-03-03 LAB — GLUCOSE, CAPILLARY: Glucose-Capillary: 154 mg/dL — ABNORMAL HIGH (ref 70–99)

## 2020-03-03 LAB — VITAMIN B12: Vitamin B-12: 239 pg/mL (ref 180–914)

## 2020-03-03 LAB — RESP PANEL BY RT-PCR (FLU A&B, COVID) ARPGX2
Influenza A by PCR: NEGATIVE
Influenza B by PCR: NEGATIVE
SARS Coronavirus 2 by RT PCR: NEGATIVE

## 2020-03-03 LAB — LACTIC ACID, PLASMA
Lactic Acid, Venous: 3 mmol/L (ref 0.5–1.9)
Lactic Acid, Venous: 0.9 mmol/L (ref 0.5–1.9)

## 2020-03-03 LAB — HIV ANTIBODY (ROUTINE TESTING W REFLEX): HIV Screen 4th Generation wRfx: NONREACTIVE

## 2020-03-03 LAB — ETHANOL: Alcohol, Ethyl (B): <10 mg/dL (ref <10)

## 2020-03-03 MED ORDER — ALPRAZOLAM 0.25 MG PO TABS
1.0000 mg | ORAL_TABLET | Freq: Four times a day (QID) | ORAL | Status: DC | PRN
  Administered 2020-03-04 – 2020-03-06 (×5): 1 mg via ORAL
  Filled 2020-03-03 (×5): qty 4

## 2020-03-03 MED ORDER — ACETAMINOPHEN 650 MG RE SUPP: 650.0000 mg | Freq: Four times a day (QID) | RECTAL | Status: DC | PRN

## 2020-03-03 MED ORDER — HYOSCYAMINE SULFATE 0.125 MG SL SUBL
0.1250 mg | SUBLINGUAL_TABLET | Freq: Four times a day (QID) | SUBLINGUAL | Status: DC | PRN
  Filled 2020-03-03: qty 1

## 2020-03-03 MED ORDER — SODIUM CHLORIDE 0.9 % IV SOLN: INTRAVENOUS | Status: AC

## 2020-03-03 MED ORDER — ACETAMINOPHEN 325 MG PO TABS
650.0000 mg | ORAL_TABLET | Freq: Four times a day (QID) | ORAL | Status: DC | PRN
  Administered 2020-03-04 – 2020-03-06 (×3): 650 mg via ORAL
  Filled 2020-03-03 (×3): qty 2

## 2020-03-03 MED ORDER — SODIUM CHLORIDE 0.9 % IV BOLUS
500.0000 mL | Freq: Once | INTRAVENOUS | Status: AC
  Administered 2020-03-03: 500 mL via INTRAVENOUS

## 2020-03-03 MED ORDER — ENOXAPARIN SODIUM 40 MG/0.4ML ~~LOC~~ SOLN
40.0000 mg | SUBCUTANEOUS | Status: DC
  Administered 2020-03-03 – 2020-03-05 (×3): 40 mg via SUBCUTANEOUS
  Filled 2020-03-03 (×3): qty 0.4

## 2020-03-03 MED ORDER — VANCOMYCIN HCL 1000 MG/200ML IV SOLN: 1000.0000 mg | INTRAVENOUS | Status: DC

## 2020-03-03 MED ORDER — POLYETHYLENE GLYCOL 3350 17 G PO PACK: 17.0000 g | PACK | Freq: Every day | ORAL | Status: DC | PRN

## 2020-03-03 MED ORDER — SODIUM CHLORIDE 0.9 % IV SOLN: INTRAVENOUS | Status: DC

## 2020-03-03 MED ORDER — ONDANSETRON HCL 4 MG PO TABS
4.0000 mg | ORAL_TABLET | Freq: Four times a day (QID) | ORAL | Status: DC | PRN
  Administered 2020-03-05: 4 mg via ORAL
  Filled 2020-03-03: qty 1

## 2020-03-03 MED ORDER — DEXTROSE 50 % IV SOLN: 1.0000 | Freq: Once | INTRAVENOUS | Status: AC

## 2020-03-03 MED ORDER — VITAMIN D 25 MCG (1000 UNIT) PO TABS
1000.0000 [IU] | ORAL_TABLET | Freq: Every day | ORAL | Status: DC
  Administered 2020-03-04 – 2020-03-06 (×3): 1000 [IU] via ORAL
  Filled 2020-03-03 (×3): qty 1

## 2020-03-03 MED ORDER — ONDANSETRON HCL 4 MG/2ML IJ SOLN
4.0000 mg | Freq: Four times a day (QID) | INTRAMUSCULAR | Status: DC | PRN
  Administered 2020-03-04 – 2020-03-06 (×4): 4 mg via INTRAVENOUS
  Filled 2020-03-03 (×4): qty 2

## 2020-03-03 MED ORDER — SODIUM CHLORIDE 0.9 % IV SOLN
2.0000 g | Freq: Once | INTRAVENOUS | Status: AC
  Administered 2020-03-03: 2 g via INTRAVENOUS
  Filled 2020-03-03: qty 2

## 2020-03-03 MED ORDER — POTASSIUM CHLORIDE CRYS ER 20 MEQ PO TBCR
40.0000 meq | EXTENDED_RELEASE_TABLET | ORAL | Status: AC
  Administered 2020-03-03 – 2020-03-04 (×2): 40 meq via ORAL
  Filled 2020-03-03 (×3): qty 2

## 2020-03-03 MED ORDER — DEXTROSE 50 % IV SOLN
INTRAVENOUS | Status: AC
  Administered 2020-03-03: 50 mL via INTRAVENOUS
  Filled 2020-03-03: qty 50

## 2020-03-03 MED ORDER — SODIUM CHLORIDE 0.9 % IV BOLUS
1000.0000 mL | Freq: Once | INTRAVENOUS | Status: AC
  Administered 2020-03-03: 1000 mL via INTRAVENOUS

## 2020-03-03 MED ORDER — SODIUM CHLORIDE 0.9 % IV SOLN: 2.0000 g | Freq: Two times a day (BID) | INTRAVENOUS | Status: DC

## 2020-03-03 MED ORDER — PANTOPRAZOLE SODIUM 40 MG PO TBEC
40.0000 mg | DELAYED_RELEASE_TABLET | Freq: Every day | ORAL | Status: DC
  Administered 2020-03-04 – 2020-03-06 (×3): 40 mg via ORAL
  Filled 2020-03-03 (×3): qty 1

## 2020-03-03 MED ORDER — AMLODIPINE BESYLATE 5 MG PO TABS: 5.0000 mg | ORAL_TABLET | Freq: Every day | ORAL | Status: DC

## 2020-03-03 MED ORDER — INSULIN ASPART 100 UNIT/ML ~~LOC~~ SOLN
0.0000 [IU] | Freq: Three times a day (TID) | SUBCUTANEOUS | Status: DC
  Administered 2020-03-03 – 2020-03-04 (×2): 3 [IU] via SUBCUTANEOUS
  Administered 2020-03-04: 2 [IU] via SUBCUTANEOUS
  Administered 2020-03-04: 3 [IU] via SUBCUTANEOUS
  Administered 2020-03-04: 2 [IU] via SUBCUTANEOUS
  Administered 2020-03-05 – 2020-03-06 (×5): 3 [IU] via SUBCUTANEOUS

## 2020-03-03 MED ORDER — VANCOMYCIN HCL 1500 MG/300ML IV SOLN
1500.0000 mg | Freq: Once | INTRAVENOUS | Status: DC
Start: 2020-03-03 — End: 2020-03-03
  Administered 2020-03-03: 1500 mg via INTRAVENOUS
  Filled 2020-03-03: qty 300

## 2020-03-03 NOTE — H&P (Signed)
History and Physical    [name] XBL:[id number] DOB: 04-01-1955 DOA: 03/03/2020  PCP: Elfredia Nevins, MD  Patient coming from: Home via EMS   Chief Complaint: Weakness, Slurring speech   HPI:    65 year old female with past medical history of latent autoimmune diabetes mellitus, hyperlipidemia, hypertension, bipolar disorder type I, anxiety disorder, gastroesophageal reflux disease, diabetic polyneuropathy, chronic abdominal pain and constipation who presents to [facility] emergency department via EMS with severe weakness and lethargy.  History is been obtained from both the patient and husband who is at the bedside.  According to the husband, he and the patient were out doing errands earlier today when he suddenly noticed that she began to exhibit slurred speech.  Patient was seemingly becoming more more lethargic throughout the morning in association with this.  The symptoms persisted and on arrival home, the husband reports that the patient was so weak that she essentially "slid" out of the car into the driveway.  He could not get her to stand up after repeated attempts due to substantial lethargy weakness and continued slurred speech EMS was contacted.  Of note, patient reports a approximate 1 month history of lethargy and ongoing nausea.  The husband reports that for approximately the past 3 days the patient has seemingly been more anxious and forgetful than usual.    The patient denies fever, sick contacts, recent travel, confirmed contact with COVID-19 infection, changes in medications, medication overuse particularly of her benzodiazepines, cough for shortness of breath.    EMS promptly came to evaluate the patient.  Per EMS reports, patient's initial systolic blood pressure was in the 60s with heart rate in the 1 teens.  Patient was administered 1 L of normal saline with improvement in blood pressure to 98/62.  EMS brought the patient to [facility] emergency  department for evaluation.  Upon evaluation in the emergency department patient was identified to have lactic acidosis of 3.0, acute kidney injury with creatinine of 1.74.  Patient was found to continue to exhibit lethargy and mild slurring of speech.  Patient was found to be profoundly weak and unable to sit in bed even to obtain orthostatic vital signs.  Chest x-ray revealed no evidence of pneumonia.  Due to concerns for early sepsis patient was administered intravenous cefepime and vancomycin.  Patient received an additional 1500 cc of fluid boluses.  The hospitalist group was then called to assess the patient for admission to the hospital.  Review of Systems:   ROS  Past Medical History:  Diagnosis Date   Anxiety    Bipolar 1 disorder (HCC)    Breast density 06/25/2012   Right breast density, will get mammogram and [place]   Depression    Diabetes mellitus without complication (HCC)    type 1   Duodenitis    GERD (gastroesophageal reflux disease)    History of kidney stones    HTN (hypertension)    Hyperlipidemia    IBS (irritable bowel syndrome)    Neuropathy    Sepsis (HCC) 02/27/2019    Past Surgical History:  Procedure Laterality Date   ABDOMINAL HYSTERECTOMY     partial   CHOLECYSTECTOMY     COLONOSCOPY     CYSTOSCOPY WITH RETROGRADE PYELOGRAM, URETEROSCOPY AND STENT PLACEMENT Right 03/31/2019   Procedure: CYSTOSCOPY WITH RETROGRADE PYELOGRAM, URETEROSCOPY AND STENT PLACEMENT;  Surgeon: Noel Christmas, MD;  Location: [facility];  Service: Urology;  Laterality: Right;   CYSTOSCOPY WITH STENT PLACEMENT Right  03/02/2019   Procedure: CYSTOSCOPY WITH STENT PLACEMENT;  Surgeon: Noel ChristmasPace, Maryellen D, MD;  [facility];  Service: Urology;  Laterality: Right;   HOLMIUM LASER APPLICATION Right 03/31/2019   Procedure: HOLMIUM LASER APPLICATION;  Surgeon: Noel ChristmasPace, Maryellen D, MD;  [facility];  Service: Urology;  Laterality: Right;    UPPER GASTROINTESTINAL ENDOSCOPY       reports that she has never smoked. She has never used smokeless tobacco. She reports that she does not drink alcohol and does not use drugs.  Allergies  Allergen Reactions   Abilify [Aripiprazole]     Patient is intolerant   Phenergan [Promethazine] Diarrhea and Nausea And Vomiting   Reglan [Metoclopramide] Other (See Comments)    Chest pains    Family History  Problem Relation Age of Onset   Hypertension Mother    Bipolar disorder Mother    Cancer Mother    Heart disease Brother    Thyroid disease Sister    Hypertension Sister    Bipolar disorder Sister    Depression Father    Cancer Father    Bipolar disorder Daughter    Bipolar disorder Maternal Aunt    Colon cancer Neg Hx    Colon polyps Neg Hx    Esophageal cancer Neg Hx    Kidney disease Neg Hx    Gallbladder disease Neg Hx    Diabetes Neg Hx    Dementia Neg Hx      Prior to Admission medications   Medication Sig Start Date End Date Taking? Authorizing Provider  acetaminophen (TYLENOL) 500 MG tablet Take 1,000 mg by mouth every 6 (six) hours as needed for mild pain or headache.    [provider]  ALPRAZolam Prudy Feeler(XANAX) 1 MG tablet Take 1 mg by mouth 4 (four) times daily as needed for anxiety.  08/10/18   [provider]  AMBULATORY NON FORMULARY MEDICATION Squatty Potty x 1 03/07/15   Nandigam, Eleonore ChiquitoKavitha V, MD  amLODipine (NORVASC) 5 MG tablet Take 5 mg by mouth daily. 02/19/19   [provider]  BD PEN NEEDLE NANO U/F 32G X 4 MM MISC Use with insulin pen 4 times a day 09/24/19   Carlus PavlovGherghe, Cristina, MD  BENICAR 40 MG tablet Take 40 mg by mouth daily.  02/02/15   [provider]  betamethasone dipropionate 0.05 % cream Apply topically 2 (two) times daily.    [provider]  cholecalciferol (VITAMIN D3) 25 MCG (1000 UNIT) tablet Take by mouth daily.     [provider]  Cyanocobalamin (VITAMIN B-12 PO) Take by  mouth.    [provider]  cycloSPORINE (RESTASIS) 0.05 % ophthalmic emulsion 1 drop 2 (two) times daily. Patient not taking: Reported on 11/09/2019    [provider]  Docusate Sodium (COLACE PO) Take by mouth daily.    [provider]  ferrous sulfate 325 (65 FE) MG tablet Take 325 mg by mouth in the morning, at noon, and at bedtime.    [provider]  FIBER PO Take by mouth.    [provider]  gabapentin (NEURONTIN) 800 MG tablet Take 800 mg by mouth 3 (three) times daily.  02/05/15   [provider]  glucagon 1 MG injection Inject 1 mg into the muscle once as needed for up to 1 dose. 06/30/19   Carlus PavlovGherghe, Cristina, MD  glucose blood (FREESTYLE LITE) test strip USE TO CHECK BLOOD SUGAR 6 TIMES PER DAY 06/18/19   Carlus PavlovGherghe, Cristina, MD  insulin aspart (NOVOLOG FLEXPEN) 100 UNIT/ML FlexPen Inject 10 -12 Units into the skin daily after breakfast AND 8 -10 Units daily before lunch AND 10-12 Units daily before supper.  201-250=2 units, 251-300=3 units, 301-350=4units, >350= 5 unitsMax daily 40 units. 11/16/19   Carlus Pavlov, MD  insulin glargine (LANTUS SOLOSTAR) 100 UNIT/ML Solostar Pen Inject 32 Units into the skin daily. 11/16/19   Carlus Pavlov, MD  Insulin Syringes, Disposable, U-100 1 ML MISC To use for insulin injection.. 09/23/15   Carlus Pavlov, MD  Lancets (FREESTYLE) lancets USE TO TEST BLOOD SUGAR 4 TO 6 TIMES DAILY AS INSTRUCTED 09/14/19   Carlus Pavlov, MD  metFORMIN (GLUCOPHAGE) 500 MG tablet TAKE 1 TABLET TWICE A DAY WITH MEALS 08/12/19   Carlus Pavlov, MD  nortriptyline (PAMELOR) 50 MG capsule Take 50 mg by mouth at bedtime.  02/17/19   [provider]  omeprazole (PRILOSEC) 40 MG capsule TAKE 1 CAPSULE TWICE A DAY Patient taking differently: daily.  03/10/19   Nandigam, Eleonore Chiquito, MD  ondansetron (ZOFRAN-ODT) 4 MG disintegrating tablet PLACE 1 TO 2 TABLETS ON TONGUE EVERY 4 TO 6 HOURS AS NEEDED FOR NAUSEA  12/15/18   Nandigam, Eleonore Chiquito, MD  OSCIMIN 0.125 MG SUBL DISSOLVE 1 TABLET UNDER THE TONGUE EVERY 4 HOURS AS NEEDED (NEED APPOINTMENT) 09/11/19   Napoleon Form, MD  Probiotic CAPS Take 1 capsule by mouth daily. 02/25/18   Ward, Layla Maw, DO  traZODone (DESYREL) 100 MG tablet 1  qhs  May increse to 2  qhs  After 1 week Patient taking differently: Take 100 mg by mouth at bedtime. 1  qhs  May increse to 2  qhs  After 1 week 04/10/18   Archer Asa, MD    Physical Exam: Vitals:   03/03/20 1515 03/03/20 1606 03/03/20 1700 03/03/20 1900  BP: (!) 107/57  103/60 126/68  Pulse: 84  79 83  Resp: Temp:  (!) 96.3 F (35.7 C)  98.2 F (36.8 C)  TempSrc:  Rectal    SpO2: 100%  100% 100%  Weight:      Height:        Constitutional: Acute alert and oriented x3, no associated distress.   Skin: no rashes, no lesions, poor skin turgor noted. Eyes: Pupils are equally reactive to light.  No evidence of scleral icterus or conjunctival pallor.  ENMT: Notably dry mucous membranes noted.  Posterior pharynx clear of any exudate or lesions.   Neck: normal, supple, no masses, no thyromegaly.  No evidence of jugular venous distension.   Respiratory: clear to auscultation bilaterally, no wheezing, no crackles. Normal respiratory effort. No accessory muscle use.  Cardiovascular: Regular rate and rhythm, no murmurs / rubs / gallops. No extremity edema. 2+ pedal pulses. No carotid bruits.  Chest:   Nontender without crepitus or deformity.   Back:   Nontender without crepitus or deformity. Abdomen: Notable lower abdominal tenderness, worst in the left lower quadrant.  No evidence of intra-abdominal masses.  Positive bowel sounds noted in all quadrants.   Musculoskeletal: No joint deformity upper and lower extremities. Good ROM, no contractures. Normal muscle tone.  Neurologic: Patient is somewhat lethargic but arousable and oriented x3.  CN 2-12 grossly intact. Sensation intact.  Patient moving all  4 extremities spontaneously.  Patient is following all commands.  Patient is responsive to verbal stimuli.   Psychiatric: Patient exhibits somewhat angry mood with appropriate affect.  Patient does not seem to possess insight as to  her current situation.  Labs on Admission: I have personally reviewed following labs and imaging studies -   CBC: Recent Labs  Lab 03/03/20 1248  WBC 8.0  NEUTROABS 5.1  HGB 10.5*  HCT 33.2*  MCV 95.4  PLT 182   Basic Metabolic Panel: Recent Labs  Lab 03/03/20 1248  NA 140  K 3.6  CL 106  CO2 24  GLUCOSE 44*  BUN 28*  CREATININE 1.74*  CALCIUM 8.2*   GFR: Estimated Creatinine Clearance: 31.6 mL/min (A) (by C-G formula based on SCr of 1.74 mg/dL (H)). Liver Function Tests: Recent Labs  Lab 03/03/20 1248  AST 20  ALT 17  ALKPHOS 61  BILITOT 0.6  PROT 5.4*  ALBUMIN 3.2*   No results for input(s): LIPASE, AMYLASE in the last 168 hours. Recent Labs  Lab 03/03/20 1357  AMMONIA 31   Coagulation Profile: No results for input(s): INR, PROTIME in the last 168 hours. Cardiac Enzymes: No results for input(s): CKTOTAL, CKMB, CKMBINDEX, TROPONINI in the last 168 hours. BNP (last 3 results) No results for input(s): PROBNP in the last 8760 hours. HbA1C: No results for input(s): HGBA1C in the last 72 hours. CBG: Recent Labs  Lab 03/03/20 1345 03/03/20 1412 03/03/20 1621 03/03/20 1829  GLUCAP 43* 168* 94 94   Lipid Profile: No results for input(s): CHOL, HDL, LDLCALC, TRIG, CHOLHDL, LDLDIRECT in the last 72 hours. Thyroid Function Tests: Recent Labs    03/03/20 1711  TSH 0.533   Anemia Panel: No results for input(s): VITAMINB12, FOLATE, FERRITIN, TIBC, IRON, RETICCTPCT in the last 72 hours. Urine analysis:    Component Value Date/Time   COLORURINE YELLOW 03/03/2020 1655   APPEARANCEUR HAZY (A) 03/03/2020 1655   LABSPEC 1.005 03/03/2020 1655   PHURINE 5.0 03/03/2020 1655   GLUCOSEU NEGATIVE 03/03/2020 1655   HGBUR NEGATIVE  03/03/2020 1655   BILIRUBINUR NEGATIVE 03/03/2020 1655   KETONESUR NEGATIVE 03/03/2020 1655   PROTEINUR NEGATIVE 03/03/2020 1655   UROBILINOGEN 0.2 04/15/2012 0907   NITRITE NEGATIVE 03/03/2020 1655   LEUKOCYTESUR SMALL (A) 03/03/2020 1655    Radiological Exams on Admission - Personally Reviewed: CT Head Wo Contrast  Result Date: 03/03/2020 CLINICAL DATA:  Delirium and weakness EXAM: CT HEAD WITHOUT CONTRAST TECHNIQUE: Contiguous axial images were obtained from the base of the skull through the vertex without intravenous contrast. COMPARISON:  02/25/2018 FINDINGS: Brain: No evidence of acute infarction, hemorrhage, hydrocephalus, extra-axial collection or mass lesion/mass effect. Vascular: No hyperdense vessel or unexpected calcification. Skull: Normal. Negative for fracture or focal lesion. Sinuses/Orbits: No acute finding. Other: None. IMPRESSION: No acute intracranial abnormality noted. Electronically Signed   By: Alcide Clever M.D.   On: 03/03/2020 13:28   DG Chest Port 1 View  Result Date: 03/03/2020 CLINICAL DATA:  Questionable sepsis and weakness EXAM: PORTABLE CHEST 1 VIEW COMPARISON:  02/25/2020 FINDINGS: Cardiac shadow is stable. Lungs are well aerated bilaterally. No focal infiltrate or sizable effusion is seen. Minimal scarring is noted in the left base. No bony abnormality is noted. IMPRESSION: Minimal left basilar scarring.  No acute abnormality noted. Electronically Signed   By: Alcide Clever M.D.   On: 03/03/2020 13:27    EKG: Personally reviewed.  Rhythm is normal sinus rhythm with heart rate of 85 bpm.  No dynamic ST segment changes appreciated.  Assessment/Plan Principal Problem:   Toxic metabolic encephalopathy   Patient is presenting with what seems to be an approximate 3-day history of mood change and forgetfulness with a 1 day history  of substantial lethargy and slurring of speech  Neurologic exam is nonfocal, I believe that an acute stroke is extremely unlikely.  So  far, I cannot identify a focal source of infection.  I believe meningitis is extremely unlikely in the absence of photophobia or nuchal rigidity.  Chest x-ray reveals no evidence of pneumonia.  Urinalysis is not particularly suggestive of urinary tract infection although it is not completely normal.  In the setting of clinical volume depletion, acute kidney injury and lactic acidosis I am concerned that patient may be suffering from polypharmacy due to the use of multiple sedating agents including Xanax, nortriptyline, trazodone and gabapentin  Hydrating patient with intravenous isotonic fluids, holding all sedating agents for now which will slowly be restarted  Serial neurologic checks  Monitoring patient on telemetry  Obtaining TSH, vitamin B12  Ammonia, hepatic function panel, CT head unremarkable  Due to abdominal tenderness on examination will obtain CT imaging of the abdomen pelvis, preferably with contrast if renal function is improving.  Active Problems:   Hypotension   Patient was found to be quite hypotensive on arrival of EMS which persisted shortly after arrival to the emergency department  This seems to be improving with intravenous volume resuscitation suggesting that this may have been secondary to hypovolemia  Polypharmacy may also be contributing  Holding home antihypertensives  As mentioned above, I cannot find a definitive source of infection that may have contributed.  Monitoring lactic acid levels, blood cultures, continuing to look for sources of infection with CT imaging of the abdomen and pelvis.    Lactic acidosis   At this time I feel that lactic acidosis is primarily secondary to volume depletion  Hydrating patient with intravenous isotonic fluids  Performing serial lactic acid levels to ensure downtrending and resolution    Acute kidney injury (HCC)   Patient suffering from acute kidney injury, likely secondary to prerenal injury from volume  depletion  Patient is already received a total of 2.5 L of isotonic boluses today  We will continue to gently hydrate patient with intravenous isotonic fluids  Strict input and output monitoring  Monitoring renal function and electrolytes with serial chemistries    LADA (latent autoimmune diabetes in adults), managed as type 1 , complicated by hypoglycemia (HCC)   Hypoglycemia noted upon arrival to the emergency department requiring administration of D50  I believe hypoglycemia may be secondary to diminished renal function, leading to increased efficacy of patient's longstanding insulin  Holding patient's basal bolus insulin regimen at this time  Transitioning patient to his sliding scale only  Hemoglobin A1c pending  We will resume and titrate basal bolus insulin therapy once hypoglycemia resolves.    Bipolar 1 disorder (HCC)   Holding home regimen of Xanax, trazodone and nortriptyline for now until encephalopathy improves  Please see remainder of assessment and plan above    Chronic constipation and abdominal pain   Continuing as needed laxatives  Continue as needed Bentyl    Diabetic polyneuropathy (HCC)   Holding gabapentin until encephalopathy shows signs of improvement    GERD without esophagitis    Continue home regimen of PPI   Code Status:  Full code Family Communication: Husband is at bedside who has been updated on plan of care  Status is: Observation  The patient remains OBS appropriate and will d/c before 2 midnights.  Dispo: The patient is from: Home              Anticipated d/c is to: Home  Patient currently is not medically stable to d/c.   Difficult to place patient No        Marinda Elk MD [facility] Pager [phone]  If 7PM-7AM, please contact night-coverage [email or web address] Use universal [place] password for that web site. If you do not have the password, please call the hospital  operator.  03/03/2020, 8:02 PM

## 2020-03-03 NOTE — Sepsis Progress Note (Signed)
Notified bedside nurse of need to draw repeat lactic acid. 

## 2020-03-03 NOTE — Sepsis Progress Note (Signed)
elink is following this code sepsis 

## 2020-03-03 NOTE — ED Provider Notes (Addendum)
[facility] EMERGENCY DEPARTMENT Provider Note   CSN: [id number] Arrival date & time: 03/03/20  1229     History No chief complaint on file.   [name] is a 65 y.o. female from past medical history of bipolar 1, depression, diabetes, hypertension, hyperlipidemia, neuropathy that presents the emergency department today for altered mental status.  Patient had presyncopal episode at home, became confused and when EMS arrived they palpated a blood pressure of 60s.  Patient was given a liter of normal saline and blood pressure did rise to 98/62, when patient arrived here blood pressure 87/57, negative orthostatics.  Patient appears confused, however is very hard to understand because she is not wearing her dentures.    She is unsure what happened today, husband is on the way.  Patient is level 5 caveat due to altered mental status. Glucose 100 per EMS.  Husband  arrived, he states that for the past 3 days she has been hypermanic from her bipolar 1, they went to the store today, and patient started feeling weak slid all the way down from the car to the floor.  Did not fall or hit her self.  Did not hit her head.  States that she was to weak to get up and has been started noticing that she is acting altered, saying things that did not really fit with the scenario.  Denies any facial droop, slurred speech, neglect.  States that for the past month she has been having low p.o. intake and feeling bad due to her IBS.  States that he thinks she is probably weak from her bipolar episode in addition to her low p.o. intake.  Denies any new medications, no narcotics or alcohol use.  He states that she has been taking all of her medications.  HPI     Past Medical History:  Diagnosis Date  . Anxiety   . Bipolar 1 disorder (HCC)   . Breast density 06/25/2012   Right breast density, will get mammogram and [place]  . Depression   . Diabetes mellitus without complication (HCC)    type 1  .  Duodenitis   . GERD (gastroesophageal reflux disease)   . History of kidney stones   . HTN (hypertension)   . Hyperlipidemia   . IBS (irritable bowel syndrome)   . Neuropathy   . Sepsis (HCC) 02/27/2019    Patient Active Problem List   Diagnosis Date Noted  . Diabetic polyneuropathy (HCC) 03/03/2020  . GERD without esophagitis 03/03/2020  . Cognitive and behavioral changes 07/08/2019  . Anxiety state   . Acute pyelonephritis   . Urinary tract obstruction by kidney stone   . E coli bacteremia 03/01/2019  . Peripheral neuropathy 02/07/2017  . Chronic constipation 05/13/2016  . Breast density 06/25/2012  . Encephalopathy, toxic 04/15/2012  . Suicide ideation 04/15/2012  . LADA (latent autoimmune diabetes in adults), managed as type 1 (HCC)   . Depression   . IBS (irritable bowel syndrome)   . Essential hypertension   . Bipolar 1 disorder (HCC)   . Abnormality of gait 11/07/2011    Past Surgical History:  Procedure Laterality Date  . ABDOMINAL HYSTERECTOMY     partial  . CHOLECYSTECTOMY    . COLONOSCOPY    . CYSTOSCOPY WITH RETROGRADE PYELOGRAM, URETEROSCOPY AND STENT PLACEMENT Right 03/31/2019   Procedure: CYSTOSCOPY WITH RETROGRADE PYELOGRAM, URETEROSCOPY AND STENT PLACEMENT;  Surgeon: Noel Christmas, MD;  Location: [facility];  Service: Urology;  Laterality:  Right;  Marland Kitchen. CYSTOSCOPY WITH STENT PLACEMENT Right 03/02/2019   Procedure: CYSTOSCOPY WITH STENT PLACEMENT;  Surgeon: Noel ChristmasPace, Maryellen D, MD;  [facility];  Service: Urology;  Laterality: Right;  . HOLMIUM LASER APPLICATION Right 03/31/2019   Procedure: HOLMIUM LASER APPLICATION;  Surgeon: Noel ChristmasPace, Maryellen D, MD;  [facility];  Service: Urology;  Laterality: Right;  . UPPER GASTROINTESTINAL ENDOSCOPY       OB History    Gravida  1   Para  1   Term      Preterm      AB      Living        SAB      IAB      Ectopic      Multiple      Live Births               Family History  Problem Relation Age of Onset  . Hypertension Mother   . Bipolar disorder Mother   . Cancer Mother   . Heart disease Brother   . Thyroid disease Sister   . Hypertension Sister   . Bipolar disorder Sister   . Depression Father   . Cancer Father   . Bipolar disorder Daughter   . Bipolar disorder Maternal Aunt   . Colon cancer Neg Hx   . Colon polyps Neg Hx   . Esophageal cancer Neg Hx   . Kidney disease Neg Hx   . Gallbladder disease Neg Hx   . Diabetes Neg Hx   . Dementia Neg Hx     Social History   Tobacco Use  . Smoking status: Never Smoker  . Smokeless tobacco: Never Used  Vaping Use  . Vaping Use: Some days  . Substances: Flavoring  Substance Use Topics  . Alcohol use: No    Alcohol/week: 0.0 standard drinks  . Drug use: No    Home Medications Prior to Admission medications   Medication Sig Start Date End Date Taking? Authorizing Provider  acetaminophen (TYLENOL) 500 MG tablet Take 1,000 mg by mouth every 6 (six) hours as needed for mild pain or headache.   Yes [provider]  ALPRAZolam Prudy Feeler(XANAX) 1 MG tablet Take 1 mg by mouth 4 (four) times daily as needed for anxiety.  08/10/18  Yes [provider]  amLODipine (NORVASC) 5 MG tablet Take 5 mg by mouth daily. 02/19/19  Yes [provider]  BENICAR 40 MG tablet Take 40 mg by mouth daily.  02/02/15  Yes [provider]  busPIRone (BUSPAR) 10 MG tablet Take 10 mg by mouth in the morning and at bedtime. 02/23/20  Yes [provider]  cholecalciferol (VITAMIN D3) 25 MCG (1000 UNIT) tablet Take 1,000 Units by mouth daily.   Yes [provider]  Docusate Sodium (COLACE PO) Take 100 mg by mouth daily.   Yes [provider]  esomeprazole (NEXIUM) 40 MG capsule Take 40 mg by mouth 2 (two) times daily. 01/20/20  Yes [provider]  gabapentin (NEURONTIN) 800 MG tablet Take 800 mg by mouth 3 (three) times daily.  02/05/15  Yes [provider]  glucagon 1 MG injection Inject 1 mg into the muscle once as needed for up to 1 dose. 06/30/19  Yes Carlus PavlovGherghe, Cristina, MD  insulin aspart (NOVOLOG FLEXPEN) 100 UNIT/ML FlexPen Inject 10 -12 Units into the skin daily after breakfast AND 8 -10 Units daily before lunch AND 10-12 Units daily before supper.  201-250=2  units, 251-300=3 units, 301-350=4units, >350= 5 unitsMax daily 40 units. 11/16/19  Yes Carlus Pavlov, MD  insulin glargine (LANTUS SOLOSTAR) 100 UNIT/ML Solostar Pen Inject 32 Units into the skin daily. Patient taking differently: Inject 36 Units into the skin at bedtime. Inject 36 units into the skin 11/16/19  Yes Carlus Pavlov, MD  losartan (COZAAR) 25 MG tablet Take 25 mg by mouth daily. 02/23/20  Yes [provider]  metFORMIN (GLUCOPHAGE) 500 MG tablet TAKE 1 TABLET TWICE A DAY WITH MEALS Patient taking differently: Take 500 mg by mouth 2 (two) times daily with a meal. 08/12/19  Yes Carlus Pavlov, MD  nortriptyline (PAMELOR) 50 MG capsule Take 50 mg by mouth at bedtime.  02/17/19  Yes [provider]  ondansetron (ZOFRAN-ODT) 4 MG disintegrating tablet PLACE 1 TO 2 TABLETS ON TONGUE EVERY 4 TO 6 HOURS AS NEEDED FOR NAUSEA Patient taking differently: Take 4-8 mg by mouth every 4 (four) hours as needed for nausea or vomiting. 12/15/18  Yes Nandigam, Eleonore Chiquito, MD  Probiotic CAPS Take 1 capsule by mouth daily. 02/25/18  Yes Ward, Layla Maw, DO  Propylene Glycol (SYSTANE BALANCE OP) Place 1 drop into both eyes daily as needed (dry eyes).   Yes [provider]  traZODone (DESYREL) 100 MG tablet 1  qhs  May increse to 2  qhs  After 1 week Patient taking differently: Take 100 mg by mouth at bedtime. 1  qhs  May increse to 2  qhs  After 1 week 04/10/18  Yes Plovsky, Earvin Hansen, MD  venlafaxine (EFFEXOR) 37.5 MG tablet Take 37.5 mg by mouth daily. 01/22/20  Yes [provider]  vitamin B-12 (CYANOCOBALAMIN) 1000 MCG tablet Take 1,000 mcg by  mouth daily.   Yes [provider]  AMBULATORY NON FORMULARY MEDICATION Squatty Potty x 1 03/07/15   Nandigam, Eleonore Chiquito, MD  BD PEN NEEDLE NANO U/F 32G X 4 MM MISC Use with insulin pen 4 times a day 09/24/19   Carlus Pavlov, MD  glucose blood (FREESTYLE LITE) test strip USE TO CHECK BLOOD SUGAR 6 TIMES PER DAY 03/08/20   Carlus Pavlov, MD  Insulin Syringes, Disposable, U-100 1 ML MISC To use for insulin injection.. 09/23/15   Carlus Pavlov, MD  Lancets (FREESTYLE) lancets USE TO TEST BLOOD SUGAR 4 TO 6 TIMES DAILY AS INSTRUCTED 09/14/19   Carlus Pavlov, MD  OSCIMIN 0.125 MG SUBL DISSOLVE 1 TABLET UNDER THE TONGUE EVERY 4 HOURS AS NEEDED (NEED APPOINTMENT) 09/11/19   Napoleon Form, MD    Allergies    Abilify [aripiprazole], Phenergan [promethazine], and Reglan [metoclopramide]  Review of Systems   Review of Systems  Constitutional: Negative for chills, diaphoresis, fatigue and fever.  HENT: Negative for congestion, sore throat and trouble swallowing.   Eyes: Negative for pain and visual disturbance.  Respiratory: Negative for cough, shortness of breath and wheezing.   Cardiovascular: Negative for chest pain, palpitations and leg swelling.  Gastrointestinal: Negative for abdominal distention, abdominal pain, diarrhea, nausea and vomiting.  Genitourinary: Negative for difficulty urinating.  Musculoskeletal: Negative for back pain, neck pain and neck stiffness.  Skin: Negative for pallor.  Neurological: Positive for syncope (presyncope). Negative for dizziness, speech difficulty, weakness and headaches.  Psychiatric/Behavioral: Negative for confusion.    Physical Exam Updated Vital Signs BP (!) 186/94 (BP Location: Right Arm)   Pulse 96   Temp 98.5 F (36.9 C) (Oral)   Resp 18   Ht  (1.626 m)   Wt 77.1 kg  SpO2 99%   BMI 29.18 kg/m   Physical Exam Constitutional:      General: She is not in acute distress.    Appearance: Normal appearance. She is  not ill-appearing, toxic-appearing or diaphoretic.     Comments: No acute distress  HENT:     Head: Normocephalic and atraumatic.     Mouth/Throat:     Mouth: Mucous membranes are moist.     Pharynx: Oropharynx is clear.  Eyes:     General: No scleral icterus.    Extraocular Movements: Extraocular movements intact.     Pupils: Pupils are equal, round, and reactive to light.  Cardiovascular:     Rate and Rhythm: Normal rate and regular rhythm.     Pulses: Normal pulses.     Heart sounds: Normal heart sounds.  Pulmonary:     Effort: Pulmonary effort is normal. No respiratory distress.     Breath sounds: Normal breath sounds. No stridor. No wheezing, rhonchi or rales.  Chest:     Chest wall: No tenderness.  Abdominal:     General: Abdomen is flat. There is no distension.     Palpations: Abdomen is soft.     Tenderness: There is no abdominal tenderness. There is no guarding or rebound.  Musculoskeletal:        General: No swelling or tenderness. Normal range of motion.     Cervical back: Normal range of motion and neck supple. No rigidity.     Right lower leg: No edema.     Left lower leg: No edema.  Skin:    General: Skin is warm and dry.     Capillary Refill: Capillary refill takes less than 2 seconds.     Coloration: Skin is not pale.  Neurological:     General: No focal deficit present.     Mental Status: She is alert and oriented to person, place, and time.     Cranial Nerves: No cranial nerve deficit.     Motor: No weakness.     Comments: Patient is alert and oriented, however is dysarthric, gets confused.  No facial droop  Psychiatric:        Mood and Affect: Mood normal.        Behavior: Behavior normal.     ED Results / Procedures / Treatments   Labs (all labs ordered are listed, but only abnormal results are displayed) Labs Reviewed  CULTURE, BLOOD (SINGLE) - Abnormal; Notable for the following components:      Result Value   Culture   (*)    Value:  STAPHYLOCOCCUS EPIDERMIDIS THE SIGNIFICANCE OF ISOLATING THIS ORGANISM FROM A SINGLE SET OF BLOOD CULTURES WHEN MULTIPLE SETS ARE DRAWN IS UNCERTAIN. PLEASE NOTIFY THE MICROBIOLOGY DEPARTMENT WITHIN ONE WEEK IF SPECIATION AND SENSITIVITIES ARE REQUIRED. Performed at [facility], [address], [place], [place] [address]    All other components within normal limits  URINE CULTURE - Abnormal; Notable for the following components:   Culture >=100,000 COLONIES/mL KLEBSIELLA PNEUMONIAE (*)    Organism ID, Bacteria KLEBSIELLA PNEUMONIAE (*)    All other components within normal limits  BLOOD CULTURE ID PANEL (REFLEXED) - BCID2 - Abnormal; Notable for the following components:   Staphylococcus species DETECTED (*)    Staphylococcus epidermidis DETECTED (*)    Methicillin resistance mecA/C DETECTED (*)    All other components within normal limits  LACTIC ACID, PLASMA - Abnormal; Notable for the following components:   Lactic Acid, Venous 3.0 (*)  All other components within normal limits  COMPREHENSIVE METABOLIC PANEL - Abnormal; Notable for the following components:   Glucose, Bld 44 (*)    BUN 28 (*)    Creatinine, Ser 1.74 (*)    Calcium 8.2 (*)    Total Protein 5.4 (*)    Albumin 3.2 (*)    GFR, Estimated 32 (*)    All other components within normal limits  CBC WITH DIFFERENTIAL/PLATELET - Abnormal; Notable for the following components:   RBC 3.48 (*)    Hemoglobin 10.5 (*)    HCT 33.2 (*)    All other components within normal limits  URINALYSIS, ROUTINE W REFLEX MICROSCOPIC - Abnormal; Notable for the following components:   APPearance HAZY (*)    Leukocytes,Ua SMALL (*)    Bacteria, UA MANY (*)    All other components within normal limits  RAPID URINE DRUG SCREEN, HOSP PERFORMED - Abnormal; Notable for the following components:   Benzodiazepines POSITIVE (*)    Tetrahydrocannabinol POSITIVE (*)    All other components within normal limits  COMPREHENSIVE METABOLIC PANEL  - Abnormal; Notable for the following components:   Glucose, Bld 133 (*)    Calcium 8.3 (*)    Total Protein 5.7 (*)    Albumin 3.3 (*)    All other components within normal limits  MAGNESIUM - Abnormal; Notable for the following components:   Magnesium 1.4 (*)    All other components within normal limits  CBC WITH DIFFERENTIAL/PLATELET - Abnormal; Notable for the following components:   RBC 3.62 (*)    Hemoglobin 11.1 (*)    HCT 33.4 (*)    All other components within normal limits  RENAL FUNCTION PANEL - Abnormal; Notable for the following components:   Potassium 3.0 (*)    Chloride 113 (*)    CO2 19 (*)    Glucose, Bld 117 (*)    Calcium 6.7 (*)    Phosphorus 2.2 (*)    Albumin 2.9 (*)    All other components within normal limits  GLUCOSE, CAPILLARY - Abnormal; Notable for the following components:   Glucose-Capillary 154 (*)    All other components within normal limits  GLUCOSE, CAPILLARY - Abnormal; Notable for the following components:   Glucose-Capillary 169 (*)    All other components within normal limits  GLUCOSE, CAPILLARY - Abnormal; Notable for the following components:   Glucose-Capillary 161 (*)    All other components within normal limits  GLUCOSE, CAPILLARY - Abnormal; Notable for the following components:   Glucose-Capillary 138 (*)    All other components within normal limits  GLUCOSE, CAPILLARY - Abnormal; Notable for the following components:   Glucose-Capillary 143 (*)    All other components within normal limits  BASIC METABOLIC PANEL - Abnormal; Notable for the following components:   Glucose, Bld 159 (*)    BUN 7 (*)    Calcium 8.8 (*)    All other components within normal limits  GLUCOSE, CAPILLARY - Abnormal; Notable for the following components:   Glucose-Capillary 160 (*)    All other components within normal limits  GLUCOSE, CAPILLARY - Abnormal; Notable for the following components:   Glucose-Capillary 196 (*)    All other components  within normal limits  GLUCOSE, CAPILLARY - Abnormal; Notable for the following components:   Glucose-Capillary 171 (*)    All other components within normal limits  GLUCOSE, CAPILLARY - Abnormal; Notable for the following components:   Glucose-Capillary 194 (*)    All  other components within normal limits  BASIC METABOLIC PANEL - Abnormal; Notable for the following components:   Glucose, Bld 173 (*)    BUN 6 (*)    All other components within normal limits  GLUCOSE, CAPILLARY - Abnormal; Notable for the following components:   Glucose-Capillary 195 (*)    All other components within normal limits  GLUCOSE, CAPILLARY - Abnormal; Notable for the following components:   Glucose-Capillary 180 (*)    All other components within normal limits  CBG MONITORING, ED - Abnormal; Notable for the following components:   Glucose-Capillary 43 (*)    All other components within normal limits  CBG MONITORING, ED - Abnormal; Notable for the following components:   Glucose-Capillary 168 (*)    All other components within normal limits  CULTURE, BLOOD (SINGLE)  RESP PANEL BY RT-PCR (FLU A&B, COVID) ARPGX2  LACTIC ACID, PLASMA  ETHANOL  AMMONIA  TSH  HEMOGLOBIN A1C  HIV ANTIBODY (ROUTINE TESTING W REFLEX)  VITAMIN B12  CBC  MAGNESIUM  CBC  MAGNESIUM  CBG MONITORING, ED  CBG MONITORING, ED    EKG EKG Interpretation  Date/Time:  Thursday March 03 2020 12:31:22 EST Ventricular Rate:  85 PR Interval:    QRS Duration: 99 QT Interval:  395 QTC Calculation: 470 R Axis:   60 Text Interpretation: Sinus rhythm Since last tracing rate slower Otherwise no significant change Confirmed by Mancel Bale [id number]) on 03/03/2020 2:09:09 PM   Radiology No results found.  Procedures .Critical Care Performed by: Farrel Gordon, PA-C Authorized by: Farrel Gordon, PA-C   Critical care provider statement:    Critical care time (minutes):  45   Critical care was time spent personally by me on the  following activities:  Discussions with consultants, evaluation of patient's response to treatment, examination of patient, ordering and performing treatments and interventions, ordering and review of laboratory studies, ordering and review of radiographic studies, pulse oximetry, re-evaluation of patient's condition, obtaining history from patient or surrogate and review of old charts     Medications Ordered in ED Medications  0.9 %  sodium chloride infusion (0 mLs Intravenous Stopped 03/04/20 1051)  sodium chloride 0.9 % bolus 1,000 mL (0 mLs Intravenous Stopped 03/03/20 1523)  dextrose 50 % solution 50 mL (50 mLs Intravenous Given 03/03/20 1348)  sodium chloride 0.9 % bolus 500 mL (0 mLs Intravenous Stopped 03/03/20 1607)  ceFEPIme (MAXIPIME) 2 g in sodium chloride 0.9 % 100 mL IVPB (0 g Intravenous Stopped 03/03/20 1814)  potassium chloride SA (KLOR-CON) CR tablet 40 mEq (40 mEq Oral Given 03/04/20 0229)  iohexol (OMNIPAQUE) 300 MG/ML solution 100 mL (100 mLs Intravenous Contrast Given 03/04/20 0316)  magnesium sulfate IVPB 4 g 100 mL (0 g Intravenous Stopped 03/04/20 1914)    ED Course  I have reviewed the triage vital signs and the nursing notes.  Pertinent labs & imaging results that were available during my care of the patient were reviewed by me and considered in my medical decision making (see chart for details).    MDM Rules/Calculators/A&P                          BELISA EICHHOLZ is a 65 y.o. female from past medical history of bipolar 1, depression, diabetes, hypertension, hyperlipidemia, neuropathy that presents the emergency department today for altered mental status.  Patient hypotensive with altered mental status, will obtain basic labs and sepsis labs at this time. No clear source, no  tachycardia or fever.  Also obtain CT imaging of head once  pressures were stabilized.  406 Rectal temperature 96.3. Does not meet SIRS. Do not think that patient is in septic shock, Lactic acid 3, no  leukocytosis.   I think that presentation is most consistent with hypoglycemia.  CMP came back with glucose of 44, did initiate amp of D50, glucose did come back up.  Patient is on insulin.On repeat evaluation, patient is still altered, patient will need to come in for encephalopathy at this time. Awaiting urinalysis.  Nursing states patient desatted on oxygen, or when I evaluated patient after this patient was 100% on room air.  Not on any oxygen.  CT head without any acute intracranial abnormality.  Urinalysis suggestive of questionable UTI. Broad spectrum abx started. Multiple reevaluations, patient with no change in neuro exam.  Pressures improved to 103/60 after 2 half liters of fluid. Repeat CBGs have been normal.  Appears stabilized for admission.  Patient admitted to Dr. Leafy Half, [facility] hospitalist will accept the patient.  The patient appears reasonably stabilized for admission considering the current resources, flow, and capabilities available in the ED at this time, and I doubt any other [facility] requiring further screening and/or treatment in the ED prior to admission.  I discussed this case with my attending physician who cosigned this note including patient's presenting symptoms, physical exam, and planned diagnostics and interventions. Attending physician stated agreement with plan or made changes to plan which were implemented.   Attending physician assessed patient at bedside.   Final Clinical Impression(s) / ED Diagnoses Final diagnoses:  Syncope, unspecified syncope type  Delirium  Hypoglycemia    Rx / DC Orders     Farrel Gordon, PA-C 03/21/20 1054    Mancel Bale, MD 03/22/20 1020

## 2020-03-03 NOTE — ED Notes (Signed)
When doing orthostatic V/S pt unable to sit up on side of bed without assistance. Pt continued falling backwards as if to remain laying down. Pt verbalized understanding of instructions. Staff held pt in place for safety.  Did not attempt to stand pt d/t low BP

## 2020-03-03 NOTE — Progress Notes (Signed)
Pharmacy Antibiotic Note  [name] is a 65 y.o. female admitted on 03/03/2020 with infection of unknown source.  Pharmacy has been consulted for Cefepime and vancomycin dosing.  WBC wnl, LA 3, SCr 1.74 (BL ~ 0.6)   Plan: -Cefepime 2 gm IV Q 12 hours -Vancomycin 1500 mg IV load followed by vancomycin 1 gm IV Q 24 hours -Monitor CBC, renal fx, cultures and clinical progress -VT as indicated   Height: 5\' 4"  (162.6 cm) Weight: 77.1 kg (170 lb) IBW/kg (Calculated) : 54.7  Temp (24hrs), Avg:97.4 F (36.3 C), Min:96.3 F (35.7 C), Max:98.5 F (36.9 C)  Recent Labs  Lab 03/03/20 1248 03/03/20 1249  WBC 8.0  --   CREATININE 1.74*  --   LATICACIDVEN  --  3.0*    Estimated Creatinine Clearance: 31.6 mL/min (A) (by C-G formula based on SCr of 1.74 mg/dL (H)).    Allergies  Allergen Reactions  . Abilify [Aripiprazole]     Patient is intolerant  . Phenergan [Promethazine] Diarrhea and Nausea And Vomiting  . Reglan [Metoclopramide] Other (See Comments)    Chest pains    Antimicrobials this admission: Cefepime 3/3 >>  Vanc 3/3 >>   Dose adjustments this admission:  Microbiology results: 3/3 BCx:  3/3 UCx:    Thank you for allowing pharmacy to be a part of this patient's care.  05/03/20, PharmD., BCPS, BCCCP Clinical Pharmacist Please refer to [facility] for unit-specific pharmacist

## 2020-03-03 NOTE — ED Provider Notes (Signed)
  Face-to-face evaluation   History: Patient presented for evaluation of altered mental status.  At home today she had a near syncopal episode and was confused.  EMS found that her blood pressure was low "in the 60s."  She was treated with IV fluids with improvement of the blood pressure to 98/62.  Physical exam: Elderly female, alert, confused. Dysarthric. Normal strength arms and legs. Abdomen is soft, no masses.   Medical screening examination/treatment/procedure(s) were conducted as a shared visit with non-physician practitioner(s) and myself.  I personally evaluated the patient during the encounter    Mancel Bale, MD 03/05/20 1630

## 2020-03-03 NOTE — ED Triage Notes (Signed)
Pt arrives via EMS from home with complaints of weakness in legs and became confused. EMS reports palpated BP 60 with HR 110. Gave NS with increase of BP 98/62 CBG 191 HR 88 95 RA  Bilateral 18g IV

## 2020-03-04 ENCOUNTER — Observation Stay (HOSPITAL_COMMUNITY): Payer: Medicare Other

## 2020-03-04 DIAGNOSIS — Z9049 Acquired absence of other specified parts of digestive tract: Secondary | ICD-10-CM | POA: Diagnosis not present

## 2020-03-04 DIAGNOSIS — G928 Other toxic encephalopathy: Secondary | ICD-10-CM

## 2020-03-04 DIAGNOSIS — I10 Essential (primary) hypertension: Secondary | ICD-10-CM | POA: Diagnosis present

## 2020-03-04 DIAGNOSIS — R410 Disorientation, unspecified: Secondary | ICD-10-CM | POA: Diagnosis not present

## 2020-03-04 DIAGNOSIS — Z87442 Personal history of urinary calculi: Secondary | ICD-10-CM | POA: Diagnosis not present

## 2020-03-04 DIAGNOSIS — F319 Bipolar disorder, unspecified: Secondary | ICD-10-CM | POA: Diagnosis present

## 2020-03-04 DIAGNOSIS — J150 Pneumonia due to Klebsiella pneumoniae: Secondary | ICD-10-CM | POA: Diagnosis present

## 2020-03-04 DIAGNOSIS — M6281 Muscle weakness (generalized): Secondary | ICD-10-CM | POA: Diagnosis present

## 2020-03-04 DIAGNOSIS — Z20822 Contact with and (suspected) exposure to covid-19: Secondary | ICD-10-CM | POA: Diagnosis present

## 2020-03-04 DIAGNOSIS — E785 Hyperlipidemia, unspecified: Secondary | ICD-10-CM | POA: Diagnosis present

## 2020-03-04 DIAGNOSIS — E1142 Type 2 diabetes mellitus with diabetic polyneuropathy: Secondary | ICD-10-CM | POA: Diagnosis present

## 2020-03-04 DIAGNOSIS — F419 Anxiety disorder, unspecified: Secondary | ICD-10-CM | POA: Diagnosis present

## 2020-03-04 DIAGNOSIS — I700 Atherosclerosis of aorta: Secondary | ICD-10-CM | POA: Diagnosis not present

## 2020-03-04 DIAGNOSIS — N3289 Other specified disorders of bladder: Secondary | ICD-10-CM | POA: Diagnosis not present

## 2020-03-04 DIAGNOSIS — R55 Syncope and collapse: Secondary | ICD-10-CM | POA: Diagnosis present

## 2020-03-04 DIAGNOSIS — K581 Irritable bowel syndrome with constipation: Secondary | ICD-10-CM | POA: Diagnosis present

## 2020-03-04 DIAGNOSIS — Z90710 Acquired absence of both cervix and uterus: Secondary | ICD-10-CM | POA: Diagnosis not present

## 2020-03-04 DIAGNOSIS — E860 Dehydration: Secondary | ICD-10-CM | POA: Diagnosis present

## 2020-03-04 DIAGNOSIS — B9689 Other specified bacterial agents as the cause of diseases classified elsewhere: Secondary | ICD-10-CM | POA: Diagnosis present

## 2020-03-04 DIAGNOSIS — R6521 Severe sepsis with septic shock: Secondary | ICD-10-CM | POA: Diagnosis present

## 2020-03-04 DIAGNOSIS — N179 Acute kidney failure, unspecified: Secondary | ICD-10-CM | POA: Diagnosis present

## 2020-03-04 DIAGNOSIS — R4781 Slurred speech: Secondary | ICD-10-CM | POA: Diagnosis present

## 2020-03-04 DIAGNOSIS — T43595A Adverse effect of other antipsychotics and neuroleptics, initial encounter: Secondary | ICD-10-CM | POA: Diagnosis present

## 2020-03-04 DIAGNOSIS — A419 Sepsis, unspecified organism: Secondary | ICD-10-CM | POA: Diagnosis present

## 2020-03-04 DIAGNOSIS — E11649 Type 2 diabetes mellitus with hypoglycemia without coma: Secondary | ICD-10-CM | POA: Diagnosis present

## 2020-03-04 DIAGNOSIS — N12 Tubulo-interstitial nephritis, not specified as acute or chronic: Secondary | ICD-10-CM | POA: Diagnosis present

## 2020-03-04 DIAGNOSIS — G9341 Metabolic encephalopathy: Secondary | ICD-10-CM | POA: Diagnosis present

## 2020-03-04 DIAGNOSIS — K219 Gastro-esophageal reflux disease without esophagitis: Secondary | ICD-10-CM | POA: Diagnosis present

## 2020-03-04 LAB — CBC WITH DIFFERENTIAL/PLATELET
Abs Immature Granulocytes: 0.03 10*3/uL (ref 0.00–0.07)
Basophils Absolute: 0 10*3/uL (ref 0.0–0.1)
Basophils Relative: 0 %
Eosinophils Absolute: 0.2 10*3/uL (ref 0.0–0.5)
Eosinophils Relative: 1 %
HCT: 33.4 % — ABNORMAL LOW (ref 36.0–46.0)
Hemoglobin: 11.1 g/dL — ABNORMAL LOW (ref 12.0–15.0)
Immature Granulocytes: 0 %
Lymphocytes Relative: 16 %
Lymphs Abs: 1.6 10*3/uL (ref 0.7–4.0)
MCH: 30.7 pg (ref 26.0–34.0)
MCHC: 33.2 g/dL (ref 30.0–36.0)
MCV: 92.3 fL (ref 80.0–100.0)
Monocytes Absolute: 1 10*3/uL (ref 0.1–1.0)
Monocytes Relative: 9 %
Neutro Abs: 7.6 10*3/uL (ref 1.7–7.7)
Neutrophils Relative %: 74 %
Platelets: 177 10*3/uL (ref 150–400)
RBC: 3.62 MIL/uL — ABNORMAL LOW (ref 3.87–5.11)
RDW: 13.2 % (ref 11.5–15.5)
WBC: 10.4 10*3/uL (ref 4.0–10.5)
nRBC: 0 % (ref 0.0–0.2)

## 2020-03-04 LAB — COMPREHENSIVE METABOLIC PANEL
ALT: 16 U/L (ref 0–44)
AST: 17 U/L (ref 15–41)
Albumin: 3.3 g/dL — ABNORMAL LOW (ref 3.5–5.0)
Alkaline Phosphatase: 63 U/L (ref 38–126)
Anion gap: 8 (ref 5–15)
BUN: 11 mg/dL (ref 8–23)
CO2: 24 mmol/L (ref 22–32)
Calcium: 8.3 mg/dL — ABNORMAL LOW (ref 8.9–10.3)
Chloride: 106 mmol/L (ref 98–111)
Creatinine, Ser: 0.67 mg/dL (ref 0.44–1.00)
GFR, Estimated: >60 mL/min (ref >60)
Glucose, Bld: 133 mg/dL — ABNORMAL HIGH (ref 70–99)
Potassium: 4.3 mmol/L (ref 3.5–5.1)
Sodium: 138 mmol/L (ref 135–145)
Total Bilirubin: 0.9 mg/dL (ref 0.3–1.2)
Total Protein: 5.7 g/dL — ABNORMAL LOW (ref 6.5–8.1)

## 2020-03-04 LAB — BLOOD CULTURE ID PANEL (REFLEXED) - BCID2

## 2020-03-04 LAB — GLUCOSE, CAPILLARY
Glucose-Capillary: 169 mg/dL — ABNORMAL HIGH (ref 70–99)
Glucose-Capillary: 161 mg/dL — ABNORMAL HIGH (ref 70–99)
Glucose-Capillary: 138 mg/dL — ABNORMAL HIGH (ref 70–99)
Glucose-Capillary: 143 mg/dL — ABNORMAL HIGH (ref 70–99)
Glucose-Capillary: 160 mg/dL — ABNORMAL HIGH (ref 70–99)

## 2020-03-04 LAB — HEMOGLOBIN A1C
Hgb A1c MFr Bld: 5 % (ref 4.8–5.6)
Mean Plasma Glucose: 97 mg/dL

## 2020-03-04 LAB — MAGNESIUM: Magnesium: 1.4 mg/dL — ABNORMAL LOW (ref 1.7–2.4)

## 2020-03-04 MED ORDER — SODIUM CHLORIDE 0.9 % IV SOLN
INTRAVENOUS | Status: DC | PRN
  Administered 2020-03-04: 250 mL via INTRAVENOUS

## 2020-03-04 MED ORDER — TRAZODONE HCL 50 MG PO TABS
50.0000 mg | ORAL_TABLET | Freq: Every day | ORAL | Status: DC
  Administered 2020-03-04: 50 mg via ORAL
  Filled 2020-03-04: qty 1

## 2020-03-04 MED ORDER — VENLAFAXINE HCL 37.5 MG PO TABS
37.5000 mg | ORAL_TABLET | Freq: Every day | ORAL | Status: DC
  Administered 2020-03-04 – 2020-03-05 (×2): 37.5 mg via ORAL
  Filled 2020-03-04 (×2): qty 1

## 2020-03-04 MED ORDER — IOHEXOL 300 MG/ML  SOLN
100.0000 mL | Freq: Once | INTRAMUSCULAR | Status: AC | PRN
  Administered 2020-03-04: 100 mL via INTRAVENOUS

## 2020-03-04 MED ORDER — MAGNESIUM SULFATE 4 GM/100ML IV SOLN
4.0000 g | Freq: Once | INTRAVENOUS | Status: AC
  Administered 2020-03-04: 4 g via INTRAVENOUS
  Filled 2020-03-04: qty 100

## 2020-03-04 MED ORDER — PANTOPRAZOLE SODIUM 40 MG PO TBEC: 40.0000 mg | DELAYED_RELEASE_TABLET | Freq: Every day | ORAL | Status: DC

## 2020-03-04 MED ORDER — SODIUM CHLORIDE 0.9 % IV SOLN
1.0000 g | INTRAVENOUS | Status: DC
  Administered 2020-03-04 – 2020-03-06 (×3): 1 g via INTRAVENOUS
  Filled 2020-03-04 (×3): qty 10

## 2020-03-04 MED ORDER — BUSPIRONE HCL 10 MG PO TABS
10.0000 mg | ORAL_TABLET | Freq: Two times a day (BID) | ORAL | Status: DC
  Administered 2020-03-04 – 2020-03-06 (×5): 10 mg via ORAL
  Filled 2020-03-04 (×5): qty 1

## 2020-03-04 NOTE — Evaluation (Signed)
Physical Therapy Evaluation Patient Details Name: [name] MRN: [id number] DOB: Oct 01, 1955 Today's Date: 03/04/2020   History of Present Illness  65 year old female who presents to [facility] emergency department via EMS on 03/03/2020 with severe weakness, dysarthria and lethargy. Pt found to be hypotensive, chest x-ray and CT head unremarkable on 3/3. Pt found to have AKI. PMH: latent autoimmune diabetes mellitus, hyperlipidemia, hypertension, bipolar disorder type I, anxiety disorder, gastroesophageal reflux disease, diabetic polyneuropathy, chronic abdominal pain and constipation  Clinical Impression  Pt presents to PT with deficits in balance and cognition. Pt presents with tangential speech throughout session and impaired cognition, unable to path find back to room or recall room number. Pt demonstrates dynamic balance deficits at this time, requiring close supervision and verbal cues for safety. Pt has limited awareness of her current deficits and will benefit from continued acute PT services to improve safety awareness and reduce risk of fall or injury. PT recommends discharge home with outpatient PT and 24/7 supervision of family, no current DME needs.    Follow Up Recommendations Outpatient PT;Supervision/Assistance - 24 hour (dynamic balance)    Equipment Recommendations  None recommended by PT    Recommendations for Other Services       Precautions / Restrictions Precautions Precautions: Fall Restrictions Weight Bearing Restrictions: No      Mobility  Bed Mobility Overal bed mobility: Needs Assistance Bed Mobility: Supine to Sit;Sit to Supine     Supine to sit: Supervision Sit to supine: Supervision   General bed mobility comments: Min guard for safety +rail.    Transfers Overall transfer level: Needs assistance Equipment used: None Transfers: Sit to/from Stand Sit to Stand: Supervision Stand pivot transfers: Min guard       General transfer  comment: Min guard for sit to stand from EOB and for stand-pivot transfer to standard commode with HHA and cues for hand placement.  Ambulation/Gait Ambulation/Gait assistance: Supervision Gait Distance (Feet): 200 Feet Assistive device: None Gait Pattern/deviations: Step-through pattern Gait velocity: reduced Gait velocity interpretation: <1.8 ft/sec, indicate of risk for recurrent falls General Gait Details: pt with slowed step-through gait, mild lateral drift  Stairs Stairs: Yes Stairs assistance: Supervision Stair Management: One rail Right;One rail Left;Alternating pattern;Forwards Number of Stairs: 4    Wheelchair Mobility    Modified Rankin (Stroke Patients Only)       Balance Overall balance assessment: Needs assistance Sitting-balance support: No upper extremity supported;Feet supported Sitting balance-Leahy Scale: Good Sitting balance - Comments: Maintains static sitting balance at EOB with and without single UE support on bed surface.   Standing balance support: No upper extremity supported;During functional activity Standing balance-Leahy Scale: Good Standing balance comment: close supervision                             Pertinent Vitals/Pain Pain Assessment: No/denies pain Faces Pain Scale: Hurts little more Pain Location: Abdomen (chronic) and L arm after lab draw with noted swelling. Pain Descriptors / Indicators: Sore Pain Intervention(s): Limited activity within patient's tolerance;Monitored during session    Home Living Family/patient expects to be discharged to:: Private residence Living Arrangements: Spouse/significant other Available Help at Discharge: Family;Available 24 hours/day Type of Home: House Home Access: Stairs to enter Entrance Stairs-Rails:  (indicates rails present, does not report which side) Entrance Stairs-Number of Steps: 3 Home Layout: One level Home Equipment: Cane - single point      Prior Function Level of  Independence: Independent  with assistive device(s)         Comments: pt reports independence with mobility without cane in familiar places, reports driving and independence with ADLs as well     Hand Dominance   Dominant Hand: Right    Extremity/Trunk Assessment   Upper Extremity Assessment Upper Extremity Assessment: Overall WFL for tasks assessed    Lower Extremity Assessment Lower Extremity Assessment: Overall WFL for tasks assessed    Cervical / Trunk Assessment Cervical / Trunk Assessment: Normal  Communication   Communication:  (tangential)  Cognition Arousal/Alertness: Awake/alert Behavior During Therapy: WFL for tasks assessed/performed Overall Cognitive Status: No family/caregiver present to determine baseline cognitive functioning Area of Impairment: Memory;Awareness;Problem solving;Safety/judgement                     Memory: Decreased short-term memory   Safety/Judgement: Decreased awareness of safety;Decreased awareness of deficits Awareness: Intellectual Problem Solving: Difficulty sequencing General Comments: Patient A&Ox4. Answers home set-up and PLOF questions appropriately. Patient with noted increased anxiety surrounding hospital admission and ongoing health issues. Patient emotionally labile crying for several brief periods throughout session. Patient does not trust others easily.      General Comments General comments (skin integrity, edema, etc.): VSS on RA, pt incontinent of stool during session. Pt with tangential speech, unable to recall room number less than 30 seconds after PT provides this information to patient. Pt is unable to utilize signage on unit to direct herself back to room.    Exercises     Assessment/Plan    PT Assessment Patient needs continued PT services  PT Problem List Decreased balance;Decreased mobility;Decreased cognition;Decreased safety awareness;Decreased knowledge of precautions       PT Treatment  Interventions DME instruction;Gait training;Stair training;Functional mobility training;Therapeutic activities;Balance training;Cognitive remediation;Neuromuscular re-education;Patient/family education    PT Goals (Current goals can be found in the Care Plan section)  Acute Rehab PT Goals Patient Stated Goal: to go home and resolve medical issues PT Goal Formulation: With patient Time For Goal Achievement: 03/18/20 Potential to Achieve Goals: Good Additional Goals Additional Goal #1: Pt will score >19/24 on DGI to indicate a reduce risk for falls.    Frequency Min 3X/week   Barriers to discharge        Co-evaluation               AM-PAC PT "6 Clicks" Mobility  Outcome Measure Help needed turning from your back to your side while in a flat bed without using bedrails?: A Little Help needed moving from lying on your back to sitting on the side of a flat bed without using bedrails?: A Little Help needed moving to and from a bed to a chair (including a wheelchair)?: A Little Help needed standing up from a chair using your arms (e.g., wheelchair or bedside chair)?: A Little Help needed to walk in hospital room?: A Little Help needed climbing 3-5 steps with a railing? : A Little 6 Click Score: 18    End of Session   Activity Tolerance: Patient tolerated treatment well Patient left: in bed;with call bell/phone within reach;with bed alarm set Nurse Communication: Mobility status PT Visit Diagnosis: Other symptoms and signs involving the nervous system (R29.898);Other abnormalities of gait and mobility (R26.89)    Time: 1301-1320 PT Time Calculation (min) (ACUTE ONLY): 19 min   Charges:   PT Evaluation $PT Eval Low Complexity: 1 Low          Arlyss Gandy, PT, DPT Acute Rehabilitation Pager: [phone]  Arlyss Gandy 03/04/2020, 1:50 PM

## 2020-03-04 NOTE — Progress Notes (Signed)
PROGRESS NOTE    [name]  AVW:[id number] DOB: [birth year] DOA: 03/03/2020 PCP: Elfredia Nevins, MD    Brief Narrative:  [name] is a 65 year old female with past medical history significant for LADA diabetes mellitus, hyperlipidemia, essential hypertension, bipolar disorder type I, anxiety disorder, GERD, diabetic polyneuropathy, chronic abdominal pain/constipation who presented to the ED with progressive weakness and lethargy.  Apparently, while doing errands patient started developing substantial lethargy, weakness with slurred speech and EMS was contacted.  Husband also reports that patient has been more forgetful and anxious over the past 3 days.  Per EMS report, patient's initial systolic blood pressure was in the 60s with a heart rate in the 110s.  Patient was given 1 L of normal saline with improvement of blood pressure to 98/62 and transported to [facility], ED for further evaluation.  In the ED, temperature 98.5 F, HR 87, RR 20, BP 84/53, SPO2 93% on room air.  Sodium 140, potassium 3.6, chloride 106, CO2 24, glucose 44, BUN 28, creatinine 1.74, AST 20, ALT 17, total bilirubin 0.6.  Lactic acid 3.0.  WBC 8.0, hemoglobin 10.5, platelets 182.  EtOH level less than 10.  Ammonia level 31.  CT head without contrast with no acute intracranial abnormality.  Chest x-ray with no focal consolidation.  Covid-19 test negative.  EDP consulted hospital service for further evaluation management of weakness, lethargy and lactic acidosis.   Assessment & Plan:   Principal Problem:   Toxic metabolic encephalopathy Active Problems:   Muscle weakness (generalized)   LADA (latent autoimmune diabetes in adults), managed as type 1 (HCC)   Essential hypertension   Bipolar 1 disorder (HCC)   Chronic constipation   Hypotension   Lactic acidosis   Acute kidney injury (HCC)   Diabetic polyneuropathy (HCC)   GERD without esophagitis   Acute toxic metabolic encephalopathy, POA Patient  presented to ED with 3-day history of worsening confusion, lethargy and slurring of speech.  Chest x-ray with no focal consolidation.  CT head without contrast with no acute intracranial abnormality.  Ammonia level within normal limits.  HIV nonreactive, vitamin B12 239, within normal limits.  CT abdomen pelvis with stranding right kidney consistent with pyelonephritis.  Urinalysis with small leukoesterase, many bacteria 11-20 WBCs consistent with UTI/pyelonephritis.  Patient with glucose 44 on admission, likely contributing factor with poor oral intake.   --Supportive care, treatment as depicted below  Sepsis, POA Urinary tract infection/right pyelonephritis Lactic acidosis, POA Patient complaining of right-sided flank pain.  Lactic acid elevated 3.0.  Urinalysis with small leukoesterase, negative nitrite, many bacteria and 11-20 WBCs.  CT abdomen/pelvis with striated appearance right kidney consistent with pyelonephritis and mild bladder wall thickening consistent with cystitis. --Lactic acid 3.0>0.9 --Urine culture: Pending --Blood cultures 1/4: Positive GPC's in clusters w/ BCID staph epi w/ methicillin resistance detected --Continue ceftriaxone 1 g IV every 24 hours --Continue to follow cultures --CBC daily  Acute renal failure Patient presenting with creatinine 1.74.  Etiology likely secondary to prerenal azotemia with dehydration in the setting of sepsis and poor oral intake as above.  Patient received aggressive IV fluid resuscitation in the ED. --Cr 1.74>>0.87 --Continue IVF with NS at 75 mL's per hour --Avoid nephrotoxins, renally dose all medications --Continue to follow BMP daily  LADA diabetes mellitus Patient glucose 44 on admission, likely secondary to poor oral intake in the setting of insulin use.  Regimen includes Lantus 36u [place] qHS, NovoLog 10-12u qBreakfast, 8-10u qLunch, and 10-12u qDinner, and Metformin 500  mg p.o. twice daily. --Hold oral hypoglycemics while  inpatient --SSI for coverage --CBG qAC/HS  Essential hypertension Home medications include amlodipine 5 mg p.o. daily, Benicar 40mg  PO daily, losartan 25mg  PO daily.  Patient was notably hypotensive on admission. --Hold home antihypertensives --Continue to monitor blood pressure closely  Bipolar 1 disorder Anxiety --xanax 1mg  QID prn --Effexor 37.5 mg p.o. daily --BuSpar 10 mg p.o. twice daily  GERD: continue PPI    DVT prophylaxis: Lovenox   Code Status: Full Code Family Communication: No family present at bedside this morning, updated patient's spouse via telephone this afternoon  Disposition Plan:  Level of care: Telemetry Medical Status is: Observation  The patient remains OBS appropriate and will d/c before 2 midnights.  Dispo: The patient is from: Home              Anticipated d/c is to: Home              Patient currently is not medically stable to d/c.   Difficult to place patient No   Consultants:   None  Procedures:   None  Antimicrobials:   Vancomycin 3/3 - 3/3  Cefepime 3/3 - 3/3  Ceftriaxone 3/4>>   Subjective: Patient seen and examined bedside, resting comfortably.  Continues with some weakness and fatigue, although improved.  Also complains of right-sided flank pain.  Discussed with patient regarding her urinalysis and CT findings concerning for UTI/pyelonephritis and started on antibiotics.  Patient states "I get frequent UTIs".  No other questions or concerns at this time.  No family present at bedside this morning.  Denies headache, no fever/chills/night sweats, no nausea/vomiting/diarrhea, no chest pain, palpitations, no shortness of breath, no abdominal pain, no paresthesias.  No acute events overnight per nursing staff.  Objective: Vitals:   03/03/20 2031 03/03/20 2343 03/04/20 0358 03/04/20 0803  BP: (!) 143/72 118/68 125/66 (!) 147/66  Pulse: 92 92 88 98  Resp:  18 19 18   Temp: (!) 97.5 F (36.4 C) 99.1 F (37.3 C) 98.5 F (36.9  C) 98.3 F (36.8 C)  TempSrc: Oral Oral Oral Oral  SpO2: 100% 97% 96% 100%  Weight:      Height:        Intake/Output Summary (Last 24 hours) at 03/04/2020 1149 Last data filed at 03/04/2020 0657 Gross per 24 hour  Intake 2122.92 ml  Output 800 ml  Net 1322.92 ml   Filed Weights   03/03/20 1233  Weight: 77.1 kg    Examination:  General exam: Appears calm and comfortable  Respiratory system: Clear to auscultation. Respiratory effort normal.  Oxygenating well on room air Cardiovascular system: S1 & S2 heard, RRR. No JVD, murmurs, rubs, gallops or clicks. No pedal edema. Gastrointestinal system: Abdomen is nondistended, soft and nontender. No organomegaly or masses felt. Normal bowel sounds heard.  Mild CVA tenderness right side Central nervous system: Alert and oriented. No focal neurological deficits. Extremities: Symmetric 5 x 5 power. Skin: No rashes, lesions or ulcers Psychiatry: Judgement and insight appear normal. Mood & affect appropriate.     Data Reviewed: I have personally reviewed following labs and imaging studies  CBC: Recent Labs  Lab 03/03/20 1248 03/04/20 0459  WBC 8.0 10.4  NEUTROABS 5.1 7.6  HGB 10.5* 11.1*  HCT 33.2* 33.4*  MCV 95.4 92.3  PLT 182 177   Basic Metabolic Panel: Recent Labs  Lab 03/03/20 1248 03/03/20 2035 03/04/20 0459  NA 140 142 138  K 3.6 3.0* 4.3  CL 106 113*  106  CO2 24 19* 24  GLUCOSE 44* 117* 133*  BUN 28* 19 11  CREATININE 1.74* 0.84 0.67  CALCIUM 8.2* 6.7* 8.3*  MG  --   --  1.4*  PHOS  --  2.2*  --    GFR: Estimated Creatinine Clearance: 68.6 mL/min (by C-G formula based on SCr of 0.67 mg/dL). Liver Function Tests: Recent Labs  Lab 03/03/20 1248 03/03/20 2035 03/04/20 0459  AST 20  --  17  ALT 17  --  16  ALKPHOS 61  --  63  BILITOT 0.6  --  0.9  PROT 5.4*  --  5.7*  ALBUMIN 3.2* 2.9* 3.3*   No results for input(s): LIPASE, AMYLASE in the last 168 hours. Recent Labs  Lab 03/03/20 1357  AMMONIA 31    Coagulation Profile: No results for input(s): INR, PROTIME in the last 168 hours. Cardiac Enzymes: No results for input(s): CKTOTAL, CKMB, CKMBINDEX, TROPONINI in the last 168 hours. BNP (last 3 results) No results for input(s): PROBNP in the last 8760 hours. HbA1C: No results for input(s): HGBA1C in the last 72 hours. CBG: Recent Labs  Lab 03/03/20 1621 03/03/20 1829 03/03/20 2210 03/04/20 0639 03/04/20 0843  GLUCAP 94 94 154* 169* 161*   Lipid Profile: No results for input(s): CHOL, HDL, LDLCALC, TRIG, CHOLHDL, LDLDIRECT in the last 72 hours. Thyroid Function Tests: Recent Labs    03/03/20 1711  TSH 0.533   Anemia Panel: Recent Labs    03/03/20 2035  VITAMINB12 239   Sepsis Labs: Recent Labs  Lab 03/03/20 1249 03/03/20 1711  LATICACIDVEN 3.0* 0.9    Recent Results (from the past 240 hour(s))  Blood culture (routine single)     Status: None (Preliminary result)   Collection Time: 03/03/20 12:48 PM   Specimen: BLOOD LEFT FOREARM  Result Value Ref Range Status   Specimen Description BLOOD LEFT FOREARM  Final   Special Requests   Final    BOTTLES DRAWN AEROBIC AND ANAEROBIC Blood Culture adequate volume   Culture  Setup Time   Final    GRAM POSITIVE COCCI IN CLUSTERS ANAEROBIC BOTTLE ONLY CRITICAL RESULT CALLED TO, READ BACK BY AND VERIFIED WITH: PHARMD T JACKY [id number] AT 1043 AM BY CM Performed at [facility], [address], [place], [place] [address]    Culture GRAM POSITIVE COCCI IN CLUSTERS  Final   Report Status PENDING  Incomplete  Blood Culture ID Panel (Reflexed)     Status: Abnormal   Collection Time: 03/03/20 12:48 PM  Result Value Ref Range Status   Enterococcus faecalis NOT DETECTED NOT DETECTED Final   Enterococcus Faecium NOT DETECTED NOT DETECTED Final   Listeria monocytogenes NOT DETECTED NOT DETECTED Final   Staphylococcus species DETECTED (A) NOT DETECTED Final    Comment: CRITICAL RESULT CALLED TO, READ BACK BY AND VERIFIED  WITH: PHARMD T JACKY [id number] AT 1043 BY CM    Staphylococcus aureus (BCID) NOT DETECTED NOT DETECTED Final   Staphylococcus epidermidis DETECTED (A) NOT DETECTED Final    Comment: Methicillin (oxacillin) resistant coagulase negative staphylococcus. Possible blood culture contaminant (unless isolated from more than one blood culture draw or clinical case suggests pathogenicity). No antibiotic treatment is indicated for blood  culture contaminants. CRITICAL RESULT CALLED TO, READ BACK BY AND VERIFIED WITH: PHARMD T JACKY [id number] AT 1043 BY CM    Staphylococcus lugdunensis NOT DETECTED NOT DETECTED Final   Streptococcus species NOT DETECTED NOT DETECTED Final   Streptococcus agalactiae NOT  DETECTED NOT DETECTED Final   Streptococcus pneumoniae NOT DETECTED NOT DETECTED Final   Streptococcus pyogenes NOT DETECTED NOT DETECTED Final   A.calcoaceticus-baumannii NOT DETECTED NOT DETECTED Final   Bacteroides fragilis NOT DETECTED NOT DETECTED Final   Enterobacterales NOT DETECTED NOT DETECTED Final   Enterobacter cloacae complex NOT DETECTED NOT DETECTED Final   Escherichia coli NOT DETECTED NOT DETECTED Final   Klebsiella aerogenes NOT DETECTED NOT DETECTED Final   Klebsiella oxytoca NOT DETECTED NOT DETECTED Final   Klebsiella pneumoniae NOT DETECTED NOT DETECTED Final   Proteus species NOT DETECTED NOT DETECTED Final   Salmonella species NOT DETECTED NOT DETECTED Final   Serratia marcescens NOT DETECTED NOT DETECTED Final   Haemophilus influenzae NOT DETECTED NOT DETECTED Final   Neisseria meningitidis NOT DETECTED NOT DETECTED Final   Pseudomonas aeruginosa NOT DETECTED NOT DETECTED Final   Stenotrophomonas maltophilia NOT DETECTED NOT DETECTED Final   Candida albicans NOT DETECTED NOT DETECTED Final   Candida auris NOT DETECTED NOT DETECTED Final   Candida glabrata NOT DETECTED NOT DETECTED Final   Candida krusei NOT DETECTED NOT DETECTED Final   Candida parapsilosis NOT DETECTED NOT  DETECTED Final   Candida tropicalis NOT DETECTED NOT DETECTED Final   Cryptococcus neoformans/gattii NOT DETECTED NOT DETECTED Final   Methicillin resistance mecA/C DETECTED (A) NOT DETECTED Final    Comment: CRITICAL RESULT CALLED TO, READ BACK BY AND VERIFIED WITH: PHARMD T JACKY [id number] AT 1043 BY CM Performed at [facility], [address] [place], [place] [address]   Culture, blood (single)     Status: None (Preliminary result)   Collection Time: 03/03/20  5:14 PM   Specimen: BLOOD  Result Value Ref Range Status   Specimen Description BLOOD LEFT ANTECUBITAL  Final   Special Requests   Final    BOTTLES DRAWN AEROBIC AND ANAEROBIC Blood Culture adequate volume   Culture   Final    NO GROWTH < 12 HOURS Performed at [facility], [address], [place], [place] [address]    Report Status PENDING  Incomplete  Resp Panel by RT-PCR (Flu A&B, Covid) Nasopharyngeal Swab     Status: None   Collection Time: 03/03/20  6:10 PM   Specimen: Nasopharyngeal Swab; Nasopharyngeal(NP) swabs in vial transport medium  Result Value Ref Range Status   SARS Coronavirus 2 by RT PCR NEGATIVE NEGATIVE Final    Comment: (NOTE) SARS-CoV-2 target nucleic acids are NOT DETECTED.  The SARS-CoV-2 RNA is generally detectable in upper respiratory specimens during the acute phase of infection. The lowest concentration of SARS-CoV-2 viral copies this assay can detect is 138 copies/mL. A negative result does not preclude SARS-Cov-2 infection and should not be used as the sole basis for treatment or other patient management decisions. A negative result may occur with  improper specimen collection/handling, submission of specimen other than nasopharyngeal swab, presence of viral mutation(s) within the areas targeted by this assay, and inadequate number of viral copies(<138 copies/mL). A negative result must be combined with clinical observations, patient history, and epidemiological information. The  expected result is Negative.  Fact Sheet for Patients:  BloggerCourse.com  Fact Sheet for Healthcare Providers:  SeriousBroker.it  This test is no t yet approved or cleared by the [place] FDA and  has been authorized for detection and/or diagnosis of SARS-CoV-2 by FDA under an Emergency Use Authorization (EUA). This EUA will remain  in effect (meaning this test can be used) for the duration of the  COVID-19 declaration under Section 564(b)(1) of the Act, 21 U.S.C.section 360bbb-3(b)(1), unless the authorization is terminated  or revoked sooner.       Influenza A by PCR NEGATIVE NEGATIVE Final   Influenza B by PCR NEGATIVE NEGATIVE Final    Comment: (NOTE) The Xpert Xpress SARS-CoV-2/FLU/RSV plus assay is intended as an aid in the diagnosis of influenza from Nasopharyngeal swab specimens and should not be used as a sole basis for treatment. Nasal washings and aspirates are unacceptable for Xpert Xpress SARS-CoV-2/FLU/RSV testing.  Fact Sheet for Patients: BloggerCourse.comhttps://www.fda.gov/media/152166/download  Fact Sheet for Healthcare Providers: SeriousBroker.ithttps://www.fda.gov/media/152162/download  This test is not yet approved or cleared by the [place] FDA and has been authorized for detection and/or diagnosis of SARS-CoV-2 by FDA under an Emergency Use Authorization (EUA). This EUA will remain in effect (meaning this test can be used) for the duration of the COVID-19 declaration under Section 564(b)(1) of the Act, 21 U.S.C. section 360bbb-3(b)(1), unless the authorization is terminated or revoked.  Performed at [facility], [address] [place], [place] [id number]          Radiology Studies: CT Head Wo Contrast  Result Date: 03/03/2020 CLINICAL DATA:  Delirium and weakness EXAM: CT HEAD WITHOUT CONTRAST TECHNIQUE: Contiguous axial images were obtained from the base of the skull through the vertex without intravenous  contrast. COMPARISON:  02/25/2018 FINDINGS: Brain: No evidence of acute infarction, hemorrhage, hydrocephalus, extra-axial collection or mass lesion/mass effect. Vascular: No hyperdense vessel or unexpected calcification. Skull: Normal. Negative for fracture or focal lesion. Sinuses/Orbits: No acute finding. Other: None. IMPRESSION: No acute intracranial abnormality noted. Electronically Signed   By: Alcide CleverMark  Lukens M.D.   On: 03/03/2020 13:28   CT ABDOMEN PELVIS W CONTRAST  Result Date: 03/04/2020 CLINICAL DATA:  Abdominal abscess/infection suspected. EXAM: CT ABDOMEN AND PELVIS WITH CONTRAST TECHNIQUE: Multidetector CT imaging of the abdomen and pelvis was performed using the standard protocol following bolus administration of intravenous contrast. CONTRAST:  100mL OMNIPAQUE IOHEXOL 300 MG/ML  SOLN COMPARISON:  March 02, 2019 FINDINGS: Lower chest: The lung bases are clear. The heart size is normal. Hepatobiliary: The liver is normal. Status post cholecystectomy.There is relatively stable intrahepatic and extrahepatic biliary ductal dilatation. Pancreas: Normal contours without ductal dilatation. No peripancreatic fluid collection. Spleen: Unremarkable. Adrenals/Urinary Tract: --Adrenal glands: Unremarkable. --Right kidney/ureter: There are nonobstructing right-sided renal nephroliths measuring up to approximately 4 mm. There is a striated appearance of the right kidney on the delayed images, concerning for pyelonephritis. --Left kidney/ureter: No hydronephrosis or radiopaque kidney stones. --Urinary bladder: Bladder is distended. There is mild bladder wall thickening with adjacent fat stranding. Stomach/Bowel: --Stomach/Duodenum: No hiatal hernia or other gastric abnormality. Normal duodenal course and caliber. --Small bowel: Unremarkable. --Colon: Unremarkable. --Appendix: Not visualized. No right lower quadrant inflammation or free fluid. Vascular/Lymphatic: Atherosclerotic calcification is present within the  non-aneurysmal abdominal aorta, without hemodynamically significant stenosis. --No retroperitoneal lymphadenopathy. --No mesenteric lymphadenopathy. --No pelvic or inguinal lymphadenopathy. Reproductive: Status post hysterectomy. No adnexal mass. Other: No ascites or free air. The abdominal wall is normal. Musculoskeletal. No acute displaced fractures. There is a growing but benign appearing cystic lesion abutting or within the right gluteus medius muscle. This has demonstrated slow interval growth across prior studies and is almost certainly benign. IMPRESSION: 1. There is a striated appearance of the right kidney on the delayed images, concerning for pyelonephritis. 2. There are nonobstructing right-sided renal nephroliths measuring up to approximately 4 mm. 3. There is mild bladder wall thickening with  adjacent fat stranding, concerning for cystitis. Correlation with urinalysis is recommended. Aortic Atherosclerosis (ICD10-I70.0). Electronically Signed   By: Katherine Mantle M.D.   On: 03/04/2020 03:35   DG Chest Port 1 View  Result Date: 03/03/2020 CLINICAL DATA:  Questionable sepsis and weakness EXAM: PORTABLE CHEST 1 VIEW COMPARISON:  02/25/2020 FINDINGS: Cardiac shadow is stable. Lungs are well aerated bilaterally. No focal infiltrate or sizable effusion is seen. Minimal scarring is noted in the left base. No bony abnormality is noted. IMPRESSION: Minimal left basilar scarring.  No acute abnormality noted. Electronically Signed   By: Alcide Clever M.D.   On: 03/03/2020 13:27        Scheduled Meds: . cholecalciferol  1,000 Units Oral Daily  . enoxaparin (LOVENOX) injection  40 mg Subcutaneous Q24H  . insulin aspart  0-15 Units Subcutaneous TID AC & HS  . pantoprazole  40 mg Oral Daily   Continuous Infusions: . sodium chloride Stopped (03/04/20 1051)  . sodium chloride 250 mL (03/04/20 0851)  . cefTRIAXone (ROCEPHIN)  IV Stopped (03/04/20 0908)     LOS: 0 days    Time spent: 39 minutes  spent on chart review, discussion with nursing staff, consultants, updating family and interview/physical exam; more than 50% of that time was spent in counseling and/or coordination of care.    Alvira Philips [place], DO [facility] Available via Epic secure chat 7am-7pm After these hours, please refer to coverage provider listed on [email or web address] 03/04/2020, 11:49 AM

## 2020-03-04 NOTE — TOC Initial Note (Addendum)
Transition of Care [facility]) - Initial/Assessment Note    Patient Details  Name: [name] MRN: [id number] Date of Birth: 02-11-1955  Transition of Care [facility]) CM/SW Contact:    Kermit Balo, RN Phone Number: 03/04/2020, 4:04 PM  Clinical Narrative:                 Patient states she lives at home with her spouse that provides intermittent supervision. He is able to provide needed transportation.  Pt denies issues with home medications.  Outpatient and HH recommended. Pt interested in [facility] services. CM provided choice and she has no preference. CM has arranged HH services through [facility]. TOC following for further d/c needs.  Expected Discharge Plan: Home w Home Health Services Barriers to Discharge: Continued Medical Work up   Patient Goals and CMS Choice   CMS Medicare.gov Compare Post Acute Care list provided to:: Patient Choice offered to / list presented to : Patient  Expected Discharge Plan and Services Expected Discharge Plan: Home w Home Health Services   Discharge Planning Services: CM Consult Post Acute Care Choice: Home Health Living arrangements for the past 2 months: Single Family Home                           HH Arranged: PT,OT          Prior Living Arrangements/Services Living arrangements for the past 2 months: Single Family Home Lives with:: Spouse Patient language and need for interpreter reviewed:: Yes Do you feel safe going back to the place where you live?: Yes            Criminal Activity/Legal Involvement Pertinent to Current Situation/Hospitalization: No - Comment as needed  Activities of Daily Living Home Assistive Devices/Equipment: Environmental consultant (specify type) [employer] wheel) ADL Screening (condition at time of admission) Patient's cognitive ability adequate to safely complete daily activities?: Yes Is the patient deaf or have difficulty hearing?: No Does the patient have difficulty seeing, even when wearing glasses/contacts?:  No Does the patient have difficulty concentrating, remembering, or making decisions?: No Patient able to express need for assistance with ADLs?: Yes Does the patient have difficulty dressing or bathing?: No Independently performs ADLs?: Yes (appropriate for developmental age) Does the patient have difficulty walking or climbing stairs?: No Weakness of Legs: Both Weakness of Arms/Hands: None  Permission Sought/Granted                  Emotional Assessment Appearance:: Appears stated age Attitude/Demeanor/Rapport: Engaged Affect (typically observed): Accepting Orientation: : Oriented to Self,Oriented to Place,Oriented to  Time,Oriented to Situation   Psych Involvement: No (comment)  Admission diagnosis:  Delirium [R41.0] Syncope, unspecified syncope type [R55] Acute metabolic encephalopathy [G93.41] Sepsis with acute renal failure and septic shock, due to unspecified organism, unspecified acute renal failure type (HCC) [A41.9, R65.21, N17.9] Patient Active Problem List   Diagnosis Date Noted  . Acute metabolic encephalopathy 03/04/2020  . Toxic metabolic encephalopathy 03/03/2020  . Lactic acidosis 03/03/2020  . Acute kidney injury (HCC) 03/03/2020  . Diabetic polyneuropathy (HCC) 03/03/2020  . GERD without esophagitis 03/03/2020  . Cognitive and behavioral changes 07/08/2019  . Anxiety state   . Acute pyelonephritis   . Urinary tract obstruction by kidney stone   . Hypotension   . E coli bacteremia 03/01/2019  . Peripheral neuropathy 02/07/2017  . Chronic constipation 05/13/2016  . Breast density 06/25/2012  . Encephalopathy, toxic 04/15/2012  . Lithium toxicity 04/15/2012  .  Suicide ideation 04/15/2012  . LADA (latent autoimmune diabetes in adults), managed as type 1 (HCC)   . Depression   . IBS (irritable bowel syndrome)   . Essential hypertension   . Bipolar 1 disorder (HCC)   . Abnormality of gait 11/07/2011  . Muscle weakness (generalized) 11/07/2011    PCP:  Elfredia Nevins, MD Pharmacy:   [facility] - [address], [place] - [address] [place] [address] Phone: [phone] Fax: [phone]  [id number] [place], [place] - [address] AT [facility] [address] [facility] [address] [place] [address] Phone: [phone] Fax: [phone]     Social Determinants of Health (SDOH) Interventions    Readmission Risk Interventions No flowsheet data found.

## 2020-03-04 NOTE — Evaluation (Signed)
Occupational Therapy Evaluation Patient Details Name: [name] MRN: [id number] DOB: 06/08/1955 Today's Date: 03/04/2020    History of Present Illness 65 y.o. female presenting with severe weakness and lethargy. Patient hypotensive and bradycardic in ED. Patient admitted with AKI,  lactic acidosis and suspicion for sepsis. PMHx significant for latent autoimmune DM with neuropathy, HLD, HTN, bipolar 1 disorder, anxiety/depression, and chronic abdominal pain with constipation.   Clinical Impression   PTA patient was living with her spouse in a private residence and was grossly independent with ADLs/IADLs. Patient reports increased use of SPC recently 2/2 worsening weakness. Patient currently presents with deficits including generalized weakness, decreased dynamic standing balance, and increased anxiety 2/2 hospital admission and declining health. Patient also emotionally labile crying several times throughout assessment. Patient currently requiring Min guard for ADL transfers and short-distance functional mobility with HHA and Min A overall for ADLs. Patient would benefit from continued acute OT services to maximize safety and independence with self-care tasks and decrease caregiver burden in prep for d/c to next level of care. Recommendation for HHOT and initial 24hr supervision/assist. Patient reports that her husband is retired and can provide assist as needed.     Follow Up Recommendations  Home health OT;Supervision/Assistance - 24 hour    Equipment Recommendations  None recommended by OT    Recommendations for Other Services       Precautions / Restrictions Precautions Precautions: Fall Restrictions Weight Bearing Restrictions: No      Mobility Bed Mobility Overal bed mobility: Needs Assistance Bed Mobility: Supine to Sit     Supine to sit: Min guard     General bed mobility comments: Min guard for safety +rail.    Transfers Overall transfer level: Needs  assistance Equipment used: 1 person hand held assist Transfers: Sit to/from [employer] Sit to Stand: Min guard Stand pivot transfers: Min guard       General transfer comment: Min guard for sit to stand from EOB and for stand-pivot transfer to standard commode with HHA and cues for hand placement.    Balance Overall balance assessment: Needs assistance Sitting-balance support: Single extremity supported;Feet supported Sitting balance-Leahy Scale: Good Sitting balance - Comments: Maintains static sitting balance at EOB with and without single UE support on bed surface.   Standing balance support: Single extremity supported;During functional activity Standing balance-Leahy Scale: Poor Standing balance comment: Patient reliant on at least unilateral UE support with functional mobility.                           ADL either performed or assessed with clinical judgement   ADL Overall ADL's : Needs assistance/impaired     Grooming: Min guard;Standing Grooming Details (indicate cue type and reason): Min guard for steadying standing at sink level.         Upper Body Dressing : Set up;Sitting Upper Body Dressing Details (indicate cue type and reason): Donned posterior hospital gown. Lower Body Dressing: Minimal assistance;Sit to/from stand Lower Body Dressing Details (indicate cue type and reason): Able to don bilateral socks seated EOB. Toilet Transfer: Hydrographic surveyor Details (indicate cue type and reason): Min guard for steadying/safety and cues for hand placement. Toileting- Architect and Hygiene: Min guard;Sit to/from stand Toileting - Clothing Manipulation Details (indicate cue type and reason): Min guard for safety/steadying.     Functional mobility during ADLs: Min guard General ADL Comments: Patient limited by mild balance deficits requiring Min guard and  HHA for functional mobility around hospital room.     Vision  Baseline Vision/History: Wears glasses Wears Glasses: At all times Patient Visual Report: No change from baseline       Perception     Praxis      Pertinent Vitals/Pain Pain Assessment: Faces Faces Pain Scale: Hurts little more Pain Location: Abdomen (chronic) and L arm after lab draw with noted swelling. Pain Descriptors / Indicators: Sore Pain Intervention(s): Limited activity within patient's tolerance;Monitored during session     Hand Dominance Right   Extremity/Trunk Assessment Upper Extremity Assessment Upper Extremity Assessment: Overall WFL for tasks assessed   Lower Extremity Assessment Lower Extremity Assessment: Defer to PT evaluation   Cervical / Trunk Assessment Cervical / Trunk Assessment: Normal   Communication Communication Communication: Other (comment) (Tangential in speech)   Cognition Arousal/Alertness: Awake/alert Behavior During Therapy: Anxious (Emotionally labile) Overall Cognitive Status: No family/caregiver present to determine baseline cognitive functioning                                 General Comments: Patient A&Ox4. Answers home set-up and PLOF questions appropriately. Patient with noted increased anxiety surrounding hospital admission and ongoing health issues. Patient emotionally labile crying for several brief periods throughout session. Patient does not trust others easily.   General Comments  Patient with noted swelling at dorsal aspect of R forearm after lab draw. RN notified.    Exercises     Shoulder Instructions      Home Living Family/patient expects to be discharged to:: Private residence Living Arrangements: Spouse/significant other Available Help at Discharge: Family;Available 24 hours/day Type of Home: House       Home Layout: One level     Bathroom Shower/Tub: Producer, television/film/video: Handicapped height     Home Equipment: Cane - single point;Other (comment) (hurrycane)           Prior Functioning/Environment Level of Independence: Independent with assistive device(s)        Comments: Patient reports independence with ADLs/IADLs including driving. Patient notes requiring SPC with all mobility recently 2/2 worsening weakness. Patient independent with med management.        OT Problem List: Decreased strength;Impaired balance (sitting and/or standing)      OT Treatment/Interventions: Self-care/ADL training;Therapeutic exercise;Energy conservation;DME and/or AE instruction;Therapeutic activities;Cognitive remediation/compensation;Patient/family education;Balance training    OT Goals(Current goals can be found in the care plan section) Acute Rehab OT Goals Patient Stated Goal: To feel better. OT Goal Formulation: With patient Time For Goal Achievement: 03/18/20 Potential to Achieve Goals: Good ADL Goals Pt Will Perform Grooming: with modified independence;standing Pt Will Perform Upper Body Dressing: with modified independence;sitting Pt Will Perform Lower Body Dressing: with modified independence;sit to/from stand Pt Will Transfer to Toilet: with modified independence;ambulating;grab bars Pt Will Perform Toileting - Clothing Manipulation and hygiene: with modified independence;sit to/from stand Additional ADL Goal #1: Patient will recall 3 anxiety reducing strategies in prep for safe return home.  OT Frequency: Min 2X/week   Barriers to D/C:            Co-evaluation              AM-PAC OT "6 Clicks" Daily Activity     Outcome Measure Help from another person eating meals?: None Help from another person taking care of personal grooming?: A Little Help from another person toileting, which includes using toliet, bedpan, or urinal?: A Little Help  from another person bathing (including washing, rinsing, drying)?: A Little Help from another person to put on and taking off regular upper body clothing?: A Little Help from another person to put on and  taking off regular lower body clothing?: A Little 6 Click Score: 19   End of Session Equipment Utilized During Treatment: Gait belt Nurse Communication: Mobility status  Activity Tolerance: Patient tolerated treatment well Patient left: in chair;with call bell/phone within reach;with chair alarm set  OT Visit Diagnosis: Unsteadiness on feet (R26.81);Other abnormalities of gait and mobility (R26.89);Muscle weakness (generalized) (M62.81)                Time: 2355-7322 OT Time Calculation (min): 26 min Charges:  OT General Charges $OT Visit: 1 Visit OT Evaluation $OT Eval Moderate Complexity: 1 Mod OT Treatments $Self Care/Home Management : 8-22 mins  Gerilynn Mccullars H. OTR/L Supplemental OT, Department of rehab services [phone]  Klee Kolek R H. 03/04/2020, 12:07 PM

## 2020-03-04 NOTE — Progress Notes (Signed)
PHARMACY - PHYSICIAN COMMUNICATION CRITICAL VALUE ALERT - BLOOD CULTURE IDENTIFICATION (BCID)  [name] is an 65 y.o. female who presented to [facility] on 03/03/2020 with a chief complaint of weakness, slurred speech.   Assessment:    Patient initially thought to have sepsis and started empircally on vanc + cefepime. Antibiotics then narrowed to ceftriaxone. Now, Bcx growing GPCs in clusters in 1/4 bottles. BCID showing staph epi, methicillin resistance detected. Patient is afebrile, WBC wnl.   Name of physician (or Provider) Contacted: Eric [place], DO.   Current antibiotics: ceftriaxone 1g IV q24h  Changes to prescribed antibiotics recommended:  No changes at this time, will continue to monitor clinical status pending further culture results.    No results found for this or any previous visit.  Trixie Rude, PharmD PGY1 Acute Care Pharmacy Resident  03/04/2020 10:44 AM  Please check [email or web address] for unit-specific pharmacy phone numbers.

## 2020-03-05 DIAGNOSIS — G928 Other toxic encephalopathy: Secondary | ICD-10-CM | POA: Diagnosis not present

## 2020-03-05 LAB — URINE CULTURE: Culture: >=100000 — AB

## 2020-03-05 LAB — GLUCOSE, CAPILLARY
Glucose-Capillary: 196 mg/dL — ABNORMAL HIGH (ref 70–99)
Glucose-Capillary: 171 mg/dL — ABNORMAL HIGH (ref 70–99)
Glucose-Capillary: 194 mg/dL — ABNORMAL HIGH (ref 70–99)
Glucose-Capillary: 195 mg/dL — ABNORMAL HIGH (ref 70–99)

## 2020-03-05 LAB — BASIC METABOLIC PANEL
Anion gap: 9 (ref 5–15)
BUN: 7 mg/dL — ABNORMAL LOW (ref 8–23)
CO2: 27 mmol/L (ref 22–32)
Calcium: 8.8 mg/dL — ABNORMAL LOW (ref 8.9–10.3)
Chloride: 102 mmol/L (ref 98–111)
Creatinine, Ser: 0.62 mg/dL (ref 0.44–1.00)
GFR, Estimated: >60 mL/min (ref >60)
Glucose, Bld: 159 mg/dL — ABNORMAL HIGH (ref 70–99)
Potassium: 3.9 mmol/L (ref 3.5–5.1)
Sodium: 138 mmol/L (ref 135–145)

## 2020-03-05 LAB — CBC
HCT: 39.3 % (ref 36.0–46.0)
Hemoglobin: 12.4 g/dL (ref 12.0–15.0)
MCH: 29.7 pg (ref 26.0–34.0)
MCHC: 31.6 g/dL (ref 30.0–36.0)
MCV: 94 fL (ref 80.0–100.0)
Platelets: 197 10*3/uL (ref 150–400)
RBC: 4.18 MIL/uL (ref 3.87–5.11)
RDW: 13.1 % (ref 11.5–15.5)
WBC: 6.5 10*3/uL (ref 4.0–10.5)
nRBC: 0 % (ref 0.0–0.2)

## 2020-03-05 LAB — MAGNESIUM: Magnesium: 2.1 mg/dL (ref 1.7–2.4)

## 2020-03-05 MED ORDER — TRAZODONE HCL 50 MG PO TABS
50.0000 mg | ORAL_TABLET | Freq: Every evening | ORAL | Status: DC | PRN
  Administered 2020-03-05: 50 mg via ORAL
  Filled 2020-03-05: qty 1

## 2020-03-05 MED ORDER — HALOPERIDOL LACTATE 5 MG/ML IJ SOLN
2.0000 mg | Freq: Four times a day (QID) | INTRAMUSCULAR | Status: DC | PRN
  Administered 2020-03-05: 2 mg via INTRAVENOUS
  Filled 2020-03-05: qty 1

## 2020-03-05 MED ORDER — IRBESARTAN 300 MG PO TABS
300.0000 mg | ORAL_TABLET | Freq: Every day | ORAL | Status: DC
  Administered 2020-03-05 – 2020-03-06 (×2): 300 mg via ORAL
  Filled 2020-03-05 (×2): qty 1

## 2020-03-05 MED ORDER — VENLAFAXINE HCL ER 37.5 MG PO CP24
37.5000 mg | ORAL_CAPSULE | Freq: Every day | ORAL | Status: DC
  Administered 2020-03-06: 37.5 mg via ORAL
  Filled 2020-03-05: qty 1

## 2020-03-05 MED ORDER — GABAPENTIN 400 MG PO CAPS
400.0000 mg | ORAL_CAPSULE | Freq: Three times a day (TID) | ORAL | Status: DC
  Administered 2020-03-05 – 2020-03-06 (×3): 400 mg via ORAL
  Filled 2020-03-05 (×3): qty 1

## 2020-03-05 NOTE — Consult Note (Signed)
[facility] Face-to-Face Psychiatry Consult   Reason for Consult:'' 66F w/ Hx Bipolar d/o admit with UTI/pyelonephritis. Patient with episodes of agitation/confusion, crying episodes. Please evaluate for bipolar medication changes.''  Referring Physician:  [place] Eric, DO Patient Identification: [name] MRN:  [id number] Principal Diagnosis: Toxic metabolic encephalopathy Diagnosis:  Principal Problem:   Toxic metabolic encephalopathy Active Problems:   Muscle weakness (generalized)   Lithium toxicity   LADA (latent autoimmune diabetes in adults), managed as type 1 (HCC)   Essential hypertension   Bipolar 1 disorder (HCC)   Chronic constipation   Hypotension   Lactic acidosis   Acute kidney injury (HCC)   Diabetic polyneuropathy (HCC)   GERD without esophagitis   Acute metabolic encephalopathy   Total Time spent with patient: 1 hour  Subjective:   [name] is a 65 y.o. female patient admitted with altered mental status.  HPI:  Patient gave me permission to assess her in the presence of her husband.  65 year old female with past medical history significant for LADA diabetes mellitus, hyperlipidemia, essential hypertension, bipolar disorder type I, anxiety disorder, GERD, diabetic polyneuropathy, chronic abdominal pain/constipation, recurrent UTI who was admitted to the hospital due to h progressively worsening confusion, slurred speech, weakness and lethargy. Husband reports that he was on errand with his wife few days ago when patient suddenly became lethargic, weak with slurred speech. He states that he activated  EMS and patient was brought to the hospital for further evaluation and treatment. He reports that patient had a similar episode 7 months ago when she had UTI from which she became septic and a a course of IV antibiotics. Lab review for this admission, indicate that patient is positive for UTI as well which may explain her symptoms in addition to underlying bipolar  disorder. Patient denies alcohol, illicit drug use but her urine toxicology is positive for Cannabis. Today, she is alert, awake, denies psychosis, delusions, depression and self harming thoughts. However, her husband reports that his wife has been dealing with Bipolar disorder for many years and when her psychiatrist closed his office 7 years ago, her PCP took over the prescription of her medications. He also reports that patient had history of Lithium toxicity years ago and has done well on Gabapentin for mood and anxiety stabilizations. He and his wife are requesting for a psychiatric referral after the patient is medically cleared.  Past Psychiatric History: as above  Risk to Self:   Risk to Others:   Prior Inpatient Therapy:   Prior Outpatient Therapy:    Past Medical History:  Past Medical History:  Diagnosis Date  . Anxiety   . Bipolar 1 disorder (HCC)   . Breast density 06/25/2012   Right breast density, will get mammogram and [place]  . Depression   . Diabetes mellitus without complication (HCC)    type 1  . Duodenitis   . GERD (gastroesophageal reflux disease)   . History of kidney stones   . HTN (hypertension)   . Hyperlipidemia   . IBS (irritable bowel syndrome)   . Neuropathy   . Sepsis (HCC) 02/27/2019    Past Surgical History:  Procedure Laterality Date  . ABDOMINAL HYSTERECTOMY     partial  . CHOLECYSTECTOMY    . COLONOSCOPY    . CYSTOSCOPY WITH RETROGRADE PYELOGRAM, URETEROSCOPY AND STENT PLACEMENT Right 03/31/2019   Procedure: CYSTOSCOPY WITH RETROGRADE PYELOGRAM, URETEROSCOPY AND STENT PLACEMENT;  Surgeon: Noel Christmas, MD;  Location: [facility];  Service: Urology;  Laterality: Right;  . CYSTOSCOPY WITH STENT PLACEMENT Right 03/02/2019   Procedure: CYSTOSCOPY WITH STENT PLACEMENT;  Surgeon: Noel ChristmasPace, Maryellen D, MD;  [facility];  Service: Urology;  Laterality: Right;  . HOLMIUM LASER APPLICATION Right 03/31/2019   Procedure: HOLMIUM LASER  APPLICATION;  Surgeon: Noel ChristmasPace, Maryellen D, MD;  Location[facility] [place];  Service: Urology;  Laterality: Right;  . UPPER GASTROINTESTINAL ENDOSCOPY     Family History:  Family History  Problem Relation Age of Onset  . Hypertension Mother   . Bipolar disorder Mother   . Cancer Mother   . Heart disease Brother   . Thyroid disease Sister   . Hypertension Sister   . Bipolar disorder Sister   . Depression Father   . Cancer Father   . Bipolar disorder Daughter   . Bipolar disorder Maternal Aunt   . Colon cancer Neg Hx   . Colon polyps Neg Hx   . Esophageal cancer Neg Hx   . Kidney disease Neg Hx   . Gallbladder disease Neg Hx   . Diabetes Neg Hx   . Dementia Neg Hx    Family Psychiatric  History:  Social History:  Social History   Substance and Sexual Activity  Alcohol Use No  . Alcohol/week: 0.0 standard drinks     Social History   Substance and Sexual Activity  Drug Use No    Social History   Socioeconomic History  . Marital status: Married    Spouse name: phillip  . Number of children: 1  . Years of education: 612  . Highest education level: High school graduate  Occupational History  . Occupation: Disabled  Tobacco Use  . Smoking status: Never Smoker  . Smokeless tobacco: Never Used  Vaping Use  . Vaping Use: Some days  . Substances: Flavoring  Substance and Sexual Activity  . Alcohol use: No    Alcohol/week: 0.0 standard drinks  . Drug use: No  . Sexual activity: Not Currently  Other Topics Concern  . Not on file  Social History Narrative   Lives at home w/ her husband   Left-handed   Caffeine: 1-2 cups of coffee daily   Social Determinants of Health   Financial Resource Strain: Not on file  Food Insecurity: Not on file  Transportation Needs: Not on file  Physical Activity: Not on file  Stress: Not on file  Social Connections: Not on file   Additional Social History:    Allergies:   Allergies  Allergen Reactions  . Abilify  [Aripiprazole]     Patient is intolerant  . Phenergan [Promethazine] Diarrhea and Nausea And Vomiting  . Reglan [Metoclopramide] Other (See Comments)    Chest pains    Labs:  Results for orders placed or performed during the hospital encounter of 03/03/20 (from the past 48 hour(s))  Comprehensive metabolic panel     Status: Abnormal   Collection Time: 03/03/20 12:48 PM  Result Value Ref Range   Sodium 140 135 - 145 mmol/L   Potassium 3.6 3.5 - 5.1 mmol/L   Chloride 106 98 - 111 mmol/L   CO2 24 22 - 32 mmol/L   Glucose, Bld 44 (LL) 70 - 99 mg/dL    Comment: Glucose reference range applies only to samples taken after fasting for at least 8 hours. CRITICAL RESULT CALLED TO, READ BACK BY AND VERIFIED WITH: S.WRENN,RN 03/03/2020 1340 DAVISB    BUN 28 (H) 8 - 23 mg/dL   Creatinine, Ser 1.611.74 (H) 0.44 -  1.00 mg/dL   Calcium 8.2 (L) 8.9 - 10.3 mg/dL   Total Protein 5.4 (L) 6.5 - 8.1 g/dL   Albumin 3.2 (L) 3.5 - 5.0 g/dL   AST 20 15 - 41 U/L   ALT 17 0 - 44 U/L   Alkaline Phosphatase 61 38 - 126 U/L   Total Bilirubin 0.6 0.3 - 1.2 mg/dL   GFR, Estimated 32 (L) >60 mL/min    Comment: (NOTE) Calculated using the CKD-EPI Creatinine Equation (2021)    Anion gap 10 5 - 15    Comment: Performed at [facility], [address], [place], [place] [address]  CBC WITH DIFFERENTIAL     Status: Abnormal   Collection Time: 03/03/20 12:48 PM  Result Value Ref Range   WBC 8.0 4.0 - 10.5 K/uL   RBC 3.48 (L) 3.87 - 5.11 MIL/uL   Hemoglobin 10.5 (L) 12.0 - 15.0 g/dL   HCT 60.4 (L) 54.0 - 98.1 %   MCV 95.4 80.0 - 100.0 fL   MCH 30.2 26.0 - 34.0 pg   MCHC 31.6 30.0 - 36.0 g/dL   RDW 19.1 47.8 - 29.5 %   Platelets 182 150 - 400 K/uL   nRBC 0.0 0.0 - 0.2 %   Neutrophils Relative % 65 %   Neutro Abs 5.1 1.7 - 7.7 K/uL   Lymphocytes Relative 22 %   Lymphs Abs 1.7 0.7 - 4.0 K/uL   Monocytes Relative 12 %   Monocytes Absolute 1.0 0.1 - 1.0 K/uL   Eosinophils Relative 1 %   Eosinophils Absolute  0.1 0.0 - 0.5 K/uL   Basophils Relative 0 %   Basophils Absolute 0.0 0.0 - 0.1 K/uL   Immature Granulocytes 0 %   Abs Immature Granulocytes 0.02 0.00 - 0.07 K/uL    Comment: Performed at [facility], [address] [place], [place] [address]  Blood culture (routine single)     Status: Abnormal (Preliminary result)   Collection Time: 03/03/20 12:48 PM   Specimen: BLOOD LEFT FOREARM  Result Value Ref Range   Specimen Description BLOOD LEFT FOREARM    Special Requests      BOTTLES DRAWN AEROBIC AND ANAEROBIC Blood Culture adequate volume   Culture  Setup Time      GRAM POSITIVE COCCI IN CLUSTERS IN BOTH AEROBIC AND ANAEROBIC BOTTLES CRITICAL RESULT CALLED TO, READ BACK BY AND VERIFIED WITH: PHARMD T JACKY [id number] AT 1043 AM BY CM    Culture (A)     STAPHYLOCOCCUS EPIDERMIDIS THE SIGNIFICANCE OF ISOLATING THIS ORGANISM FROM A SINGLE SET OF BLOOD CULTURES WHEN MULTIPLE SETS ARE DRAWN IS UNCERTAIN. PLEASE NOTIFY THE MICROBIOLOGY DEPARTMENT WITHIN ONE WEEK IF SPECIATION AND SENSITIVITIES ARE REQUIRED. Performed at [facility], [address] [place], [place] [address]    Report Status PENDING   Ethanol     Status: None   Collection Time: 03/03/20 12:48 PM  Result Value Ref Range   Alcohol, Ethyl (B) <10 <10 mg/dL    Comment: (NOTE) Lowest detectable limit for serum alcohol is 10 mg/dL.  For medical purposes only. Performed at [facility], [address], [place], [place] [address]   Blood Culture ID Panel (Reflexed)     Status: Abnormal   Collection Time: 03/03/20 12:48 PM  Result Value Ref Range   Enterococcus faecalis NOT DETECTED NOT DETECTED   Enterococcus Faecium NOT DETECTED NOT DETECTED   Listeria monocytogenes NOT DETECTED NOT DETECTED   Staphylococcus  species DETECTED (A) NOT DETECTED    Comment: CRITICAL RESULT CALLED TO, READ BACK BY AND VERIFIED WITH: PHARMD T JACKY [id number] AT 1043 BY CM    Staphylococcus aureus (BCID) NOT DETECTED NOT DETECTED    Staphylococcus epidermidis DETECTED (A) NOT DETECTED    Comment: Methicillin (oxacillin) resistant coagulase negative staphylococcus. Possible blood culture contaminant (unless isolated from more than one blood culture draw or clinical case suggests pathogenicity). No antibiotic treatment is indicated for blood  culture contaminants. CRITICAL RESULT CALLED TO, READ BACK BY AND VERIFIED WITH: PHARMD T JACKY [id number] AT 1043 BY CM    Staphylococcus lugdunensis NOT DETECTED NOT DETECTED   Streptococcus species NOT DETECTED NOT DETECTED   Streptococcus agalactiae NOT DETECTED NOT DETECTED   Streptococcus pneumoniae NOT DETECTED NOT DETECTED   Streptococcus pyogenes NOT DETECTED NOT DETECTED   A.calcoaceticus-baumannii NOT DETECTED NOT DETECTED   Bacteroides fragilis NOT DETECTED NOT DETECTED   Enterobacterales NOT DETECTED NOT DETECTED   Enterobacter cloacae complex NOT DETECTED NOT DETECTED   Escherichia coli NOT DETECTED NOT DETECTED   Klebsiella aerogenes NOT DETECTED NOT DETECTED   Klebsiella oxytoca NOT DETECTED NOT DETECTED   Klebsiella pneumoniae NOT DETECTED NOT DETECTED   Proteus species NOT DETECTED NOT DETECTED   Salmonella species NOT DETECTED NOT DETECTED   Serratia marcescens NOT DETECTED NOT DETECTED   Haemophilus influenzae NOT DETECTED NOT DETECTED   Neisseria meningitidis NOT DETECTED NOT DETECTED   Pseudomonas aeruginosa NOT DETECTED NOT DETECTED   Stenotrophomonas maltophilia NOT DETECTED NOT DETECTED   Candida albicans NOT DETECTED NOT DETECTED   Candida auris NOT DETECTED NOT DETECTED   Candida glabrata NOT DETECTED NOT DETECTED   Candida krusei NOT DETECTED NOT DETECTED   Candida parapsilosis NOT DETECTED NOT DETECTED   Candida tropicalis NOT DETECTED NOT DETECTED   Cryptococcus neoformans/gattii NOT DETECTED NOT DETECTED   Methicillin resistance mecA/C DETECTED (A) NOT DETECTED    Comment: CRITICAL RESULT CALLED TO, READ BACK BY AND VERIFIED WITH: PHARMD T  JACKY [id number] AT 1043 BY CM Performed at [facility], [address] [place], [place] [address]   Lactic acid, plasma     Status: Abnormal   Collection Time: 03/03/20 12:49 PM  Result Value Ref Range   Lactic Acid, Venous 3.0 (HH) 0.5 - 1.9 mmol/L    Comment: CRITICAL RESULT CALLED TO, READ BACK BY AND VERIFIED WITH: S.WRENN,RN 03/03/2020 1338 DAVISB Performed at [facility], [address], [place], [place] [address]   CBG monitoring, ED     Status: Abnormal   Collection Time: 03/03/20  1:45 PM  Result Value Ref Range   Glucose-Capillary 43 (LL) 70 - 99 mg/dL    Comment: Glucose reference range applies only to samples taken after fasting for at least 8 hours.   Comment 1 Notify RN   Ammonia     Status: None   Collection Time: 03/03/20  1:57 PM  Result Value Ref Range   Ammonia 31 9 - 35 umol/L    Comment: Performed at [facility], [address], [place], [place] [address]  POC CBG, ED     Status: Abnormal   Collection Time: 03/03/20  2:12 PM  Result Value Ref Range   Glucose-Capillary 168 (H) 70 - 99 mg/dL    Comment: Glucose reference range applies only to samples taken after fasting for at least 8 hours.  Urine culture     Status: Abnormal (Preliminary result)   Collection Time: 03/03/20  4:08 PM   Specimen: In/Out Cath Urine  Result Value Ref Range   Specimen Description IN/OUT CATH URINE    Special Requests NONE    Culture (A)     >=100,000 COLONIES/mL GRAM NEGATIVE RODS SUSCEPTIBILITIES TO FOLLOW Performed at [facility], [address], [place], [place] [address]    Report Status PENDING   POC CBG, ED     Status: None   Collection Time: 03/03/20  4:21 PM  Result Value Ref Range   Glucose-Capillary 94 70 - 99 mg/dL    Comment: Glucose reference range applies only to samples taken after fasting for at least 8 hours.  Urinalysis, Routine w reflex microscopic Urine, Random     Status: Abnormal   Collection Time: 03/03/20  4:55 PM  Result  Value Ref Range   Color, Urine YELLOW YELLOW   APPearance HAZY (A) CLEAR   Specific Gravity, Urine 1.005 1.005 - 1.030   pH 5.0 5.0 - 8.0   Glucose, UA NEGATIVE NEGATIVE mg/dL   Hgb urine dipstick NEGATIVE NEGATIVE   Bilirubin Urine NEGATIVE NEGATIVE   Ketones, ur NEGATIVE NEGATIVE mg/dL   Protein, ur NEGATIVE NEGATIVE mg/dL   Nitrite NEGATIVE NEGATIVE   Leukocytes,Ua SMALL (A) NEGATIVE   RBC / HPF 0-5 0 - 5 RBC/hpf   WBC, UA 11-20 0 - 5 WBC/hpf   Bacteria, UA MANY (A) NONE SEEN   Mucus PRESENT    Hyaline Casts, UA PRESENT     Comment: Performed at [facility], [address] [place], [place] [address]  Rapid urine drug screen (hospital performed)     Status: Abnormal   Collection Time: 03/03/20  4:55 PM  Result Value Ref Range   Opiates NONE DETECTED NONE DETECTED   Cocaine NONE DETECTED NONE DETECTED   Benzodiazepines POSITIVE (A) NONE DETECTED   Amphetamines NONE DETECTED NONE DETECTED   Tetrahydrocannabinol POSITIVE (A) NONE DETECTED   Barbiturates NONE DETECTED NONE DETECTED    Comment: (NOTE) DRUG SCREEN FOR MEDICAL PURPOSES ONLY.  IF CONFIRMATION IS NEEDED FOR ANY PURPOSE, NOTIFY LAB WITHIN 5 DAYS.  LOWEST DETECTABLE LIMITS FOR URINE DRUG SCREEN Drug Class                     Cutoff (ng/mL) Amphetamine and metabolites    1000 Barbiturate and metabolites    200 Benzodiazepine                 200 Tricyclics and metabolites     300 Opiates and metabolites        300 Cocaine and metabolites        300 THC                            50 Performed at [facility], [address] [place], [place] [address]   Lactic acid, plasma     Status: None   Collection Time: 03/03/20  5:11 PM  Result Value Ref Range   Lactic Acid, Venous 0.9 0.5 - 1.9 mmol/L    Comment: Performed at [facility], [address] [place], [place] [address]  TSH     Status: None   Collection Time: 03/03/20  5:11 PM  Result Value Ref Range   TSH 0.533 0.350 - 4.500 uIU/mL     Comment: Performed by a 3rd Generation assay with a functional sensitivity of <=0.01 uIU/mL. Performed at [facility], [address] [place], [place] [address]   Culture, blood (single)     Status: None (Preliminary result)   Collection Time: 03/03/20  5:14 PM   Specimen: BLOOD  Result Value Ref Range   Specimen Description BLOOD LEFT ANTECUBITAL    Special Requests      BOTTLES DRAWN AEROBIC AND ANAEROBIC Blood Culture adequate volume   Culture      NO GROWTH 2 DAYS Performed at [facility], [address] [place] [address], [place], [place] [address]    Report Status PENDING   Resp Panel by RT-PCR (Flu A&B, Covid) Nasopharyngeal Swab     Status: None   Collection Time: 03/03/20  6:10 PM   Specimen: Nasopharyngeal Swab; Nasopharyngeal(NP) swabs in vial transport medium  Result Value Ref Range   SARS Coronavirus 2 by RT PCR NEGATIVE NEGATIVE    Comment: (NOTE) SARS-CoV-2 target nucleic acids are NOT DETECTED.  The SARS-CoV-2 RNA is generally detectable in upper respiratory specimens during the acute phase of infection. The lowest concentration of SARS-CoV-2 viral copies this assay can detect is 138 copies/mL. A negative result does not preclude SARS-Cov-2 infection and should not be used as the sole basis for treatment or other patient management decisions. A negative result may occur with  improper specimen collection/handling, submission of specimen other than nasopharyngeal swab, presence of viral mutation(s) within the areas targeted by this assay, and inadequate number of viral copies(<138 copies/mL). A negative result must be combined with clinical observations, patient history, and epidemiological information. The expected result is Negative.  Fact Sheet for Patients:  BloggerCourse.com  Fact Sheet for Healthcare Providers:  SeriousBroker.it  This test is no t yet approved or cleared by the [place] FDA and  has  been authorized for detection and/or diagnosis of SARS-CoV-2 by FDA under an Emergency Use Authorization (EUA). This EUA will remain  in effect (meaning this test can be used) for the duration of the COVID-19 declaration under Section 564(b)(1) of the Act, 21 U.S.C.section 360bbb-3(b)(1), unless the authorization is terminated  or revoked sooner.       Influenza A by PCR NEGATIVE NEGATIVE   Influenza B by PCR NEGATIVE NEGATIVE    Comment: (NOTE) The Xpert Xpress SARS-CoV-2/FLU/RSV plus assay is intended as an aid in the diagnosis of influenza from Nasopharyngeal swab specimens and should not be used as a sole basis for treatment. Nasal washings and aspirates are unacceptable for Xpert Xpress SARS-CoV-2/FLU/RSV testing.  Fact Sheet for Patients: BloggerCourse.com  Fact Sheet for Healthcare Providers: SeriousBroker.it  This test is not yet approved or cleared by the [place] FDA and has been authorized for detection and/or diagnosis of SARS-CoV-2 by FDA under an Emergency Use Authorization (EUA). This EUA will remain in effect (meaning this test can be used) for the duration of the COVID-19 declaration under Section 564(b)(1) of the Act, 21 U.S.C. section 360bbb-3(b)(1), unless the authorization is terminated or revoked.  Performed at [facility], [address], [place], [place] [address]   POC CBG, ED     Status: None   Collection Time: 03/03/20  6:29 PM  Result Value Ref Range   Glucose-Capillary 94 70 - 99 mg/dL    Comment: Glucose reference range applies only to samples taken after fasting for at least 8 hours.  Hemoglobin A1c     Status: None   Collection Time: 03/03/20  8:35 PM  Result Value Ref Range   Hgb A1c MFr Bld 5.0 4.8 -  5.6 %    Comment: (NOTE)         Prediabetes: 5.7 - 6.4         Diabetes: >6.4         Glycemic control for adults with diabetes: <7.0    Mean Plasma Glucose 97 mg/dL     Comment: (NOTE) Performed At: [facility] [address] [place], [place] [id number] Jolene Schimke MD [id number]   HIV Antibody (routine testing w rflx)     Status: None   Collection Time: 03/03/20  8:35 PM  Result Value Ref Range   HIV Screen 4th Generation wRfx Non Reactive Non Reactive    Comment: Performed at [facility], [address] [place], [place] [address]  Vitamin B12     Status: None   Collection Time: 03/03/20  8:35 PM  Result Value Ref Range   Vitamin B-12 239 180 - 914 pg/mL    Comment: (NOTE) This assay is not validated for testing neonatal or myeloproliferative syndrome specimens for Vitamin B12 levels. Performed at [facility], [address], [place], [place] [address]   Renal function panel     Status: Abnormal   Collection Time: 03/03/20  8:35 PM  Result Value Ref Range   Sodium 142 135 - 145 mmol/L   Potassium 3.0 (L) 3.5 - 5.1 mmol/L   Chloride 113 (H) 98 - 111 mmol/L   CO2 19 (L) 22 - 32 mmol/L   Glucose, Bld 117 (H) 70 - 99 mg/dL    Comment: Glucose reference range applies only to samples taken after fasting for at least 8 hours.   BUN 19 8 - 23 mg/dL   Creatinine, Ser 2.69 0.44 - 1.00 mg/dL   Calcium 6.7 (L) 8.9 - 10.3 mg/dL   Phosphorus 2.2 (L) 2.5 - 4.6 mg/dL   Albumin 2.9 (L) 3.5 - 5.0 g/dL   GFR, Estimated >48 >54 mL/min    Comment: (NOTE) Calculated using the CKD-EPI Creatinine Equation (2021)    Anion gap 10 5 - 15    Comment: Performed at [facility], [address], [place], [place] [address]  Glucose, capillary     Status: Abnormal   Collection Time: 03/03/20 10:10 PM  Result Value Ref Range   Glucose-Capillary 154 (H) 70 - 99 mg/dL    Comment: Glucose reference range applies only to samples taken after fasting for at least 8 hours.  Comprehensive metabolic panel     Status: Abnormal   Collection Time: 03/04/20  4:59 AM  Result Value Ref Range   Sodium 138 135 - 145 mmol/L   Potassium 4.3 3.5 - 5.1  mmol/L    Comment: NO VISIBLE HEMOLYSIS   Chloride 106 98 - 111 mmol/L   CO2 24 22 - 32 mmol/L   Glucose, Bld 133 (H) 70 - 99 mg/dL    Comment: Glucose reference range applies only to samples taken after fasting for at least 8 hours.   BUN 11 8 - 23 mg/dL   Creatinine, Ser 5.00 0.44 - 1.00 mg/dL   Calcium 8.3 (L) 8.9 - 10.3 mg/dL   Total Protein 5.7 (L) 6.5 - 8.1 g/dL   Albumin 3.3 (L) 3.5 - 5.0 g/dL   AST 17 15 - 41 U/L   ALT 16 0 - 44 U/L   Alkaline Phosphatase 63 38 - 126 U/L   Total Bilirubin 0.9 0.3 - 1.2 mg/dL   GFR, Estimated >93 >81 mL/min  Comment: (NOTE) Calculated using the CKD-EPI Creatinine Equation (2021)    Anion gap 8 5 - 15    Comment: Performed at [facility], [address] [place], [place] [address]  Magnesium     Status: Abnormal   Collection Time: 03/04/20  4:59 AM  Result Value Ref Range   Magnesium 1.4 (L) 1.7 - 2.4 mg/dL    Comment: Performed at [facility], [address], [place], [place] [address]  CBC WITH DIFFERENTIAL     Status: Abnormal   Collection Time: 03/04/20  4:59 AM  Result Value Ref Range   WBC 10.4 4.0 - 10.5 K/uL   RBC 3.62 (L) 3.87 - 5.11 MIL/uL   Hemoglobin 11.1 (L) 12.0 - 15.0 g/dL   HCT 29.5 (L) 28.4 - 13.2 %   MCV 92.3 80.0 - 100.0 fL   MCH 30.7 26.0 - 34.0 pg   MCHC 33.2 30.0 - 36.0 g/dL   RDW 44.0 10.2 - 72.5 %   Platelets 177 150 - 400 K/uL   nRBC 0.0 0.0 - 0.2 %   Neutrophils Relative % 74 %   Neutro Abs 7.6 1.7 - 7.7 K/uL   Lymphocytes Relative 16 %   Lymphs Abs 1.6 0.7 - 4.0 K/uL   Monocytes Relative 9 %   Monocytes Absolute 1.0 0.1 - 1.0 K/uL   Eosinophils Relative 1 %   Eosinophils Absolute 0.2 0.0 - 0.5 K/uL   Basophils Relative 0 %   Basophils Absolute 0.0 0.0 - 0.1 K/uL   Immature Granulocytes 0 %   Abs Immature Granulocytes 0.03 0.00 - 0.07 K/uL    Comment: Performed at [facility] Lab, [address] [place], [place] [address]  Glucose, capillary     Status: Abnormal   Collection  Time: 03/04/20  6:39 AM  Result Value Ref Range   Glucose-Capillary 169 (H) 70 - 99 mg/dL    Comment: Glucose reference range applies only to samples taken after fasting for at least 8 hours.  Glucose, capillary     Status: Abnormal   Collection Time: 03/04/20  8:43 AM  Result Value Ref Range   Glucose-Capillary 161 (H) 70 - 99 mg/dL    Comment: Glucose reference range applies only to samples taken after fasting for at least 8 hours.  Glucose, capillary     Status: Abnormal   Collection Time: 03/04/20 12:26 PM  Result Value Ref Range   Glucose-Capillary 138 (H) 70 - 99 mg/dL    Comment: Glucose reference range applies only to samples taken after fasting for at least 8 hours.  Glucose, capillary     Status: Abnormal   Collection Time: 03/04/20  4:31 PM  Result Value Ref Range   Glucose-Capillary 143 (H) 70 - 99 mg/dL    Comment: Glucose reference range applies only to samples taken after fasting for at least 8 hours.  Glucose, capillary     Status: Abnormal   Collection Time: 03/04/20  8:03 PM  Result Value Ref Range   Glucose-Capillary 160 (H) 70 - 99 mg/dL    Comment: Glucose reference range applies only to samples taken after fasting for at least 8 hours.   Comment 1 Notify RN    Comment 2 Document in Chart   CBC     Status: None   Collection Time: 03/05/20  3:04 AM  Result Value Ref Range   WBC 6.5 4.0 - 10.5 K/uL   RBC 4.18 3.87 - 5.11 MIL/uL  Hemoglobin 12.4 12.0 - 15.0 g/dL   HCT 16.1 09.6 - 04.5 %   MCV 94.0 80.0 - 100.0 fL   MCH 29.7 26.0 - 34.0 pg   MCHC 31.6 30.0 - 36.0 g/dL   RDW 40.9 81.1 - 91.4 %   Platelets 197 150 - 400 K/uL   nRBC 0.0 0.0 - 0.2 %    Comment: Performed at [facility], [address], [place], [place] [address]  Basic metabolic panel     Status: Abnormal   Collection Time: 03/05/20  3:04 AM  Result Value Ref Range   Sodium 138 135 - 145 mmol/L   Potassium 3.9 3.5 - 5.1 mmol/L   Chloride 102 98 - 111 mmol/L   CO2 27 22 - 32 mmol/L    Glucose, Bld 159 (H) 70 - 99 mg/dL    Comment: Glucose reference range applies only to samples taken after fasting for at least 8 hours.   BUN 7 (L) 8 - 23 mg/dL   Creatinine, Ser 6.21 0.44 - 1.00 mg/dL   Calcium 8.8 (L) 8.9 - 10.3 mg/dL   GFR, Estimated >30 >86 mL/min    Comment: (NOTE) Calculated using the CKD-EPI Creatinine Equation (2021)    Anion gap 9 5 - 15    Comment: Performed at [facility], [address], [place], [place] [address]  Magnesium     Status: None   Collection Time: 03/05/20  3:04 AM  Result Value Ref Range   Magnesium 2.1 1.7 - 2.4 mg/dL    Comment: Performed at [facility], [address] [place], [place] [address]  Glucose, capillary     Status: Abnormal   Collection Time: 03/05/20  6:03 AM  Result Value Ref Range   Glucose-Capillary 196 (H) 70 - 99 mg/dL    Comment: Glucose reference range applies only to samples taken after fasting for at least 8 hours.   Comment 1 Notify RN    Comment 2 Document in Chart     Current Facility-Administered Medications  Medication Dose Route Frequency Provider Last Rate Last Admin  . 0.9 %  sodium chloride infusion   Intravenous PRN [place], Eric J, DO   Stopped at 03/04/20 1219  . acetaminophen (TYLENOL) tablet 650 mg  650 mg Oral Q6H PRN Marinda Elk, MD   650 mg at 03/04/20 1100   Or  . acetaminophen (TYLENOL) suppository 650 mg  650 mg Rectal Q6H PRN Shalhoub, Deno Lunger, MD      . ALPRAZolam Prudy Feeler) tablet 1 mg  1 mg Oral QID PRN Marinda Elk, MD   1 mg at 03/05/20 0711  . busPIRone (BUSPAR) tablet 10 mg  10 mg Oral BID [place], Eric J, DO   10 mg at 03/05/20 2841  . cefTRIAXone (ROCEPHIN) 1 g in sodium chloride 0.9 % 100 mL IVPB  1 g Intravenous Q24H [place], Alvira Philips, DO 200 mL/hr at 03/05/20 0835 1 g at 03/05/20 0835  . cholecalciferol (VITAMIN D3) tablet 1,000 Units  1,000 Units Oral Daily Shalhoub, Deno Lunger, MD   1,000 Units at 03/05/20 [phone]  . enoxaparin (LOVENOX) injection 40 mg  40 mg  Subcutaneous Q24H Shalhoub, Deno Lunger, MD   40 mg at 03/04/20 2158  . hyoscyamine (LEVSIN SL) SL tablet 0.125 mg  0.125 mg Oral Q6H PRN Shalhoub, Deno Lunger, MD      . insulin aspart (novoLOG) injection 0-15 Units  0-15 Units Subcutaneous TID AC & HS  Marinda Elk, MD   3 Units at 03/05/20 [phone]  . irbesartan (AVAPRO) tablet 300 mg  300 mg Oral Daily [place], Eric J, DO      . ondansetron [facility]) tablet 4 mg  4 mg Oral Q6H PRN Marinda Elk, MD   4 mg at 03/05/20 0811   Or  . ondansetron (ZOFRAN) injection 4 mg  4 mg Intravenous Q6H PRN Marinda Elk, MD   4 mg at 03/05/20 0232  . pantoprazole (PROTONIX) EC tablet 40 mg  40 mg Oral Daily Shalhoub, Deno Lunger, MD   40 mg at 03/05/20 9604  . polyethylene glycol (MIRALAX / GLYCOLAX) packet 17 g  17 g Oral Daily PRN Shalhoub, Deno Lunger, MD      . traZODone (DESYREL) tablet 50 mg  50 mg Oral QHS [place], Eric J, DO   50 mg at 03/04/20 2157  . venlafaxine (EFFEXOR) tablet 37.5 mg  37.5 mg Oral Daily [place], Eric J, DO   37.5 mg at 03/05/20 0808    Musculoskeletal: Strength & Muscle Tone: within normal limits Gait & Station: normal Patient leans: N/A  Psychiatric Specialty Exam: Physical Exam Psychiatric:        Attention and Perception: Attention and perception normal.        Mood and Affect: Mood and affect normal.        Speech: Speech is rapid and pressured.        Behavior: Behavior normal. Behavior is cooperative.        Thought Content: Thought content normal.        Cognition and Memory: Cognition and memory normal.        Judgment: Judgment normal.     Review of Systems  Constitutional: Negative.   HENT: Positive for dental problem.   Eyes: Negative.   Respiratory: Negative.   Cardiovascular: Negative.   Gastrointestinal: Negative.   Psychiatric/Behavioral: Positive for confusion.    Blood pressure (!) 175/99, pulse (!) 127, temperature 98.9 F (37.2 C), temperature source Oral, resp. rate 18, height 5\' 4"  (1.626  m), weight 77.1 kg, SpO2 96 %.Body mass index is 29.18 kg/m.  General Appearance: Casual  Eye Contact:  Good  Speech:  Clear and Coherent  Volume:  Normal  Mood:  Euthymic  Affect:  Appropriate  Thought Process:  Coherent  Orientation:  Full (Time, Place, and Person)  Thought Content:  Logical  Suicidal Thoughts:  No  Homicidal Thoughts:  No  Memory:  Immediate;   Fair Recent;   Fair Remote;   Fair  Judgement:  Intact  Insight:  Fair  Psychomotor Activity:  Psychomotor Retardation  Concentration:  Concentration: Fair and Attention Span: Fair  Recall:  [employer] of Knowledge:  Fair  Language:  Good  Akathisia:  No  Handed:  Right  AIMS (if indicated):     Assets:  Communication Skills Desire for Improvement Social Support  ADL's:  Intact  Cognition:  WNL  Sleep:        Treatment Plan Summary: 65 year old female who was admitted due to altered mental status characterized by progressive confusion, weakness and lethargy. Lab review revealed UTI and urine toxicology shows that patient is positive for Cannabis. Also, home medications review indicates that patient is taking Benzodiazepine, Gabapentin, Buspar and Effexor XR all combined together in higher dose can cause confusion, disorientation and lethargy. Today, patient is alert, awake, denies delusions, psychosis and self harming thought. Pls note that any of  UTI, Cannabis  abuse and current medications combination can explain patient's symptoms.  Recommendations: -Consider weaning patient of Xanax due to cognitive effect in elderly -Continue Buspar 10 mg bid for anxiety -Continue Effexor XR 37.5 mg daily for anxiety/mood -Consider re-starting patient on Gabapentin 400 mg TID for Bipolar disorder -Optimize the treatment of UTI which may be the underlying cause for patient's symptoms  -Patient and her husband will like social worker to refer her for outpatient psychiatric care ([facility]errably [facility]) after she is medically care.  Disposition: No evidence of imminent risk to self or others at present.   Patient does not meet criteria for psychiatric inpatient admission. Supportive therapy provided about ongoing stressors. Psychiatric service signing out. Re-consult as needed  Thedore Mins, MD 03/05/2020 12:14 PM

## 2020-03-05 NOTE — Progress Notes (Signed)
Occupational Therapy Treatment Patient Details Name: [name] MRN: [id number] DOB: [birth year] Today's Date: 03/05/2020    History of present illness 65 year old female who presents to [facility] emergency department via EMS on 03/03/2020 with severe weakness, dysarthria and lethargy. Pt found to be hypotensive, chest x-ray and CT head unremarkable on 3/3. Pt found to have AKI. PMH: latent autoimmune diabetes mellitus, hyperlipidemia, hypertension, bipolar disorder type I, anxiety disorder, gastroesophageal reflux disease, diabetic polyneuropathy, chronic abdominal pain and constipation   OT comments  Pt crying on entry talking about someone putting stuff under her gown then accusing her of stealing. Pt apparently tried to leave through emergency exit.  Tangential and difficulty sustaining attention to task; flight of ideas/manic appearing. Pt's husband present and states this behavior is not her baseline. Also states pt fell x 2 before admission. May benefit form psych consult.  Recommend 24/7 S and HHOT.   Follow Up Recommendations  Home health OT;Supervision/Assistance - 24 hour    Equipment Recommendations  None recommended by OT    Recommendations for Other Services  (Psych Consult)    Precautions / Restrictions Precautions Precautions: Fall       Mobility Bed Mobility Overal bed mobility: Modified Independent                  Transfers Overall transfer level: Needs assistance Equipment used: None Transfers: Sit to/from Stand Sit to Stand: Supervision              Balance Overall balance assessment: Needs assistance Sitting-balance support: No upper extremity supported;Feet supported Sitting balance-Leahy Scale: Good     Standing balance support: No upper extremity supported;During functional activity Standing balance-Leahy Scale: Fair Standing balance comment: close supervision; hx of falls                           ADL either  performed or assessed with clinical judgement   ADL Overall ADL's : Needs assistance/impaired                                     Functional mobility during ADLs: Min guard;Rolling walker;Cueing for safety General ADL Comments: Pt able to complete her ADL tasks with set up/S and minguard A for mobility using RW; cues to wipe heself after using the bathroom due to inattention and flight of thought     Vision       Perception     Praxis      Cognition Arousal/Alertness: Awake/alert Behavior During Therapy: Anxious;Restless;Agitated;Impulsive Overall Cognitive Status: Impaired/Different from baseline Area of Impairment: Memory;Awareness;Problem solving;Safety/judgement;Attention;Orientation;Following commands                 Orientation Level: Disoriented to;Time Current Attention Level: Sustained Memory: Decreased short-term memory Following Commands: Follows one step commands with increased time Safety/Judgement: Decreased awareness of safety;Decreased awareness of deficits Awareness: Intellectual Problem Solving: Difficulty sequencing General Comments: Pt crying; labile; talking about someone putting something under her gown so they thought she was stealing something and then trying to escape (out the fire exit); tangential; flight of thoughts; states s "lady jabbed a needle into her bone"; pt had pulled out her IV last night        Exercises     Shoulder Instructions       General Comments      Pertinent Vitals/ Pain  Pain Assessment: Faces Faces Pain Scale: No hurt  Home Living                                          Prior Functioning/Environment              Frequency  Min 2X/week        Progress Toward Goals  OT Goals(current goals can now be found in the care plan section)  Progress towards OT goals: Progressing toward goals  Acute Rehab OT Goals Patient Stated Goal: to go home OT Goal  Formulation: With patient Time For Goal Achievement: 03/18/20 Potential to Achieve Goals: Good ADL Goals Pt Will Perform Grooming: with modified independence;standing Pt Will Perform Upper Body Dressing: with modified independence;sitting Pt Will Perform Lower Body Dressing: with modified independence;sit to/from stand Pt Will Transfer to Toilet: with modified independence;ambulating;grab bars Pt Will Perform Toileting - Clothing Manipulation and hygiene: with modified independence;sit to/from stand Additional ADL Goal #1: Patient will recall 3 anxiety reducing strategies in prep for safe return home.  Plan Discharge plan remains appropriate    Co-evaluation                 AM-PAC OT "6 Clicks" Daily Activity     Outcome Measure   Help from another person eating meals?: None Help from another person taking care of personal grooming?: A Little Help from another person toileting, which includes using toliet, bedpan, or urinal?: A Little Help from another person bathing (including washing, rinsing, drying)?: A Little Help from another person to put on and taking off regular upper body clothing?: A Little Help from another person to put on and taking off regular lower body clothing?: A Little 6 Click Score: 19    End of Session Equipment Utilized During Treatment: Gait belt;Rolling walker  OT Visit Diagnosis: Unsteadiness on feet (R26.81);Other abnormalities of gait and mobility (R26.89);Muscle weakness (generalized) (M62.81);Other symptoms and signs involving cognitive function   Activity Tolerance Treatment limited secondary to agitation   Patient Left in bed;with call bell/phone within reach;with bed alarm set;with family/visitor present   Nurse Communication Mobility status;Other (comment) (Husband asking to speak to nurse)        Time: [id number] OT Time Calculation (min): 26 min  Charges: OT General Charges $OT Visit: 1 Visit OT Treatments $Self Care/Home  Management : 23-37 mins Luisa Dago, OT/L   Acute OT Clinical Specialist Acute Rehabilitation Services Pager [phone] Office [phone]    [facility] 03/05/2020, 10:30 AM

## 2020-03-05 NOTE — Progress Notes (Signed)
PROGRESS NOTE    [name]  ZOX:[id number] DOB: 1956-01-01 DOA: 03/03/2020 PCP: Elfredia Nevins, MD    Brief Narrative:  [name] is a 65 year old female with past medical history significant for LADA diabetes mellitus, hyperlipidemia, essential hypertension, bipolar disorder type I, anxiety disorder, GERD, diabetic polyneuropathy, chronic abdominal pain/constipation who presented to the ED with progressive weakness and lethargy.  Apparently, while doing errands patient started developing substantial lethargy, weakness with slurred speech and EMS was contacted.  Husband also reports that patient has been more forgetful and anxious over the past 3 days.  Per EMS report, patient's initial systolic blood pressure was in the 60s with a heart rate in the 110s.  Patient was given 1 L of normal saline with improvement of blood pressure to 98/62 and transported to [facility], ED for further evaluation.  In the ED, temperature 98.5 F, HR 87, RR 20, BP 84/53, SPO2 93% on room air.  Sodium 140, potassium 3.6, chloride 106, CO2 24, glucose 44, BUN 28, creatinine 1.74, AST 20, ALT 17, total bilirubin 0.6.  Lactic acid 3.0.  WBC 8.0, hemoglobin 10.5, platelets 182.  EtOH level less than 10.  Ammonia level 31.  CT head without contrast with no acute intracranial abnormality.  Chest x-ray with no focal consolidation.  Covid-19 test negative.  EDP consulted hospital service for further evaluation management of weakness, lethargy and lactic acidosis.   Assessment & Plan:   Principal Problem:   Toxic metabolic encephalopathy Active Problems:   Muscle weakness (generalized)   LADA (latent autoimmune diabetes in adults), managed as type 1 (HCC)   Essential hypertension   Bipolar 1 disorder (HCC)   Chronic constipation   Hypotension   Lactic acidosis   Acute kidney injury (HCC)   Diabetic polyneuropathy (HCC)   GERD without esophagitis   Acute metabolic encephalopathy   Acute toxic metabolic  encephalopathy, POA Patient presented to ED with 3-day history of worsening confusion, lethargy and slurring of speech.  Chest x-ray with no focal consolidation.  CT head without contrast with no acute intracranial abnormality.  Ammonia level within normal limits.  HIV nonreactive, vitamin B12 239, within normal limits.  CT abdomen pelvis with stranding right kidney consistent with pyelonephritis.  Urinalysis with small leukoesterase, many bacteria 11-20 WBCs consistent with UTI/pyelonephritis.  Patient with glucose 44 on admission, likely contributing factor with poor oral intake.   --Supportive care, treatment as depicted below  Sepsis, POA GNR Urinary tract infection/right pyelonephritis Lactic acidosis, POA Patient complaining of right-sided flank pain.  Lactic acid elevated 3.0.  Urinalysis with small leukoesterase, negative nitrite, many bacteria and 11-20 WBCs.  CT abdomen/pelvis with striated appearance right kidney consistent with pyelonephritis and mild bladder wall thickening consistent with cystitis. --Lactic acid 3.0>0.9 --Urine culture: >100K GNR --Blood cultures 1/4: Staph epidermidis, continue to monitor --Continue ceftriaxone 1 g IV every 24 hours --Continue to follow cultures --CBC daily  Acute renal failure Patient presenting with creatinine 1.74.  Etiology likely secondary to prerenal azotemia with dehydration in the setting of sepsis and poor oral intake as above.  Patient received aggressive IV fluid resuscitation in the ED. --Cr 1.74>>0.87>0.62 --Continue IVF with NS at 75 mL's per hour --Avoid nephrotoxins, renally dose all medications --Continue to follow BMP daily  LADA diabetes mellitus Patient glucose 44 on admission, likely secondary to poor oral intake in the setting of insulin use.  Regimen includes Lantus 36u [place] qHS, NovoLog 10-12u qBreakfast, 8-10u qLunch, and 10-12u qDinner, and Metformin 500  mg p.o. twice daily. --Hold oral hypoglycemics while  inpatient --SSI for coverage --CBG qAC/HS  Essential hypertension Home medications include amlodipine 5 mg p.o. daily, Benicar 40mg  PO daily, losartan 25mg  PO daily.  Patient was notably hypotensive on admission. --Restart Benicar with hospital substitution irbesartan 300 mg p.o. daily --Continue to monitor blood pressure closely  Bipolar 1 disorder with hallucinations Anxiety --xanax 1mg  QID prn --Effexor 37.5 mg p.o. daily --BuSpar 10 mg p.o. twice daily --Patient continues with fluctuations in mental status, crying episodes and hallucinations.  Patient has not been seen by psychiatry outpatient for many years per husband's report.  PCP has not changed her current home medications per previous psychiatry recommendations.  Will consult psychiatry for further evaluation and recommendations of medication management.  GERD: continue PPI    DVT prophylaxis: Lovenox   Code Status: Full Code Family Communication: No family present at bedside this morning, updated patient's spouse via telephone yesterday afternoon  Disposition Plan:  Level of care: Telemetry Medical Status is: Observation  The patient remains OBS appropriate and will d/c before 2 midnights.  Dispo: The patient is from: Home              Anticipated d/c is to: Home              Patient currently is not medically stable to d/c.   Difficult to place patient No   Consultants:   None  Procedures:   None  Antimicrobials:   Vancomycin 3/3 - 3/3  Cefepime 3/3 - 3/3  Ceftriaxone 3/4>>   Subjective: Patient seen and examined bedside, resting comfortably.  Patient with crying episodes, high anxiety and hallucinations.  Continue to wait urine culture identification susceptibility.  Husband concerned about her mental status with likely exacerbation of her underlying bipolar disorder requesting psychiatry evaluation.  No other questions or concerns at this time.  Patient denies headache, no fever/chills/night  sweats, no nausea cefonicid diarrhea, no chest pain, no palpitations, no shortness of breath, no abdominal pain.  No acute events overnight per nurse staff.  Objective: Vitals:   03/04/20 2004 03/04/20 2336 03/05/20 0415 03/05/20 0805  BP: 130/74 118/71 137/85 (!) 175/99  Pulse: 80 90 98 (!) 127  Resp: 15 16 17 18   Temp: 98.1 F (36.7 C) 98.2 F (36.8 C) 98.2 F (36.8 C) 98.9 F (37.2 C)  TempSrc: Oral Oral Oral Oral  SpO2: 100% 98% 100% 96%  Weight:      Height:        Intake/Output Summary (Last 24 hours) at 03/05/2020 1116 Last data filed at 03/04/2020 2200 Gross per 24 hour  Intake 362.62 ml  Output --  Net 362.62 ml   Filed Weights   03/03/20 1233  Weight: 77.1 kg    Examination:  General exam: Appears calm and comfortable  Respiratory system: Clear to auscultation. Respiratory effort normal.  Oxygenating well on room air Cardiovascular system: S1 & S2 heard, RRR. No JVD, murmurs, rubs, gallops or clicks. No pedal edema. Gastrointestinal system: Abdomen is nondistended, soft and nontender. No organomegaly or masses felt. Normal bowel sounds heard.  Mild CVA tenderness right side Central nervous system: Alert and oriented. No focal neurological deficits. Extremities: Symmetric 5 x 5 power. Skin: No rashes, lesions or ulcers Psychiatry: Judgement and insight appear poor.  Depressed mood, crying episodes; hallucinations    Data Reviewed: I have personally reviewed following labs and imaging studies  CBC: Recent Labs  Lab 03/03/20 1248 03/04/20 0459 03/05/20 0304  WBC 8.0  10.4 6.5  NEUTROABS 5.1 7.6  --   HGB 10.5* 11.1* 12.4  HCT 33.2* 33.4* 39.3  MCV 95.4 92.3 94.0  PLT 182 177 197   Basic Metabolic Panel: Recent Labs  Lab 03/03/20 1248 03/03/20 2035 03/04/20 0459 03/05/20 0304  NA 140 142 138 138  K 3.6 3.0* 4.3 3.9  CL 106 113* 106 102  CO2 24 19* 24 27  GLUCOSE 44* 117* 133* 159*  BUN 28* 19 11 7*  CREATININE 1.74* 0.84 0.67 0.62  CALCIUM  8.2* 6.7* 8.3* 8.8*  MG  --   --  1.4* 2.1  PHOS  --  2.2*  --   --    GFR: Estimated Creatinine Clearance: 68.6 mL/min (by C-G formula based on SCr of 0.62 mg/dL). Liver Function Tests: Recent Labs  Lab 03/03/20 1248 03/03/20 2035 03/04/20 0459  AST 20  --  17  ALT 17  --  16  ALKPHOS 61  --  63  BILITOT 0.6  --  0.9  PROT 5.4*  --  5.7*  ALBUMIN 3.2* 2.9* 3.3*   No results for input(s): LIPASE, AMYLASE in the last 168 hours. Recent Labs  Lab 03/03/20 1357  AMMONIA 31   Coagulation Profile: No results for input(s): INR, PROTIME in the last 168 hours. Cardiac Enzymes: No results for input(s): CKTOTAL, CKMB, CKMBINDEX, TROPONINI in the last 168 hours. BNP (last 3 results) No results for input(s): PROBNP in the last 8760 hours. HbA1C: Recent Labs    03/03/20 2035  HGBA1C 5.0   CBG: Recent Labs  Lab 03/04/20 0843 03/04/20 1226 03/04/20 1631 03/04/20 2003 03/05/20 0603  GLUCAP 161* 138* 143* 160* 196*   Lipid Profile: No results for input(s): CHOL, HDL, LDLCALC, TRIG, CHOLHDL, LDLDIRECT in the last 72 hours. Thyroid Function Tests: Recent Labs    03/03/20 1711  TSH 0.533   Anemia Panel: Recent Labs    03/03/20 2035  VITAMINB12 239   Sepsis Labs: Recent Labs  Lab 03/03/20 1249 03/03/20 1711  LATICACIDVEN 3.0* 0.9    Recent Results (from the past 240 hour(s))  Blood culture (routine single)     Status: Abnormal (Preliminary result)   Collection Time: 03/03/20 12:48 PM   Specimen: BLOOD LEFT FOREARM  Result Value Ref Range Status   Specimen Description BLOOD LEFT FOREARM  Final   Special Requests   Final    BOTTLES DRAWN AEROBIC AND ANAEROBIC Blood Culture adequate volume   Culture  Setup Time   Final    GRAM POSITIVE COCCI IN CLUSTERS IN BOTH AEROBIC AND ANAEROBIC BOTTLES CRITICAL RESULT CALLED TO, READ BACK BY AND VERIFIED WITH: PHARMD T JACKY [id number] AT 1043 AM BY CM    Culture (A)  Final    STAPHYLOCOCCUS EPIDERMIDIS THE SIGNIFICANCE OF  ISOLATING THIS ORGANISM FROM A SINGLE SET OF BLOOD CULTURES WHEN MULTIPLE SETS ARE DRAWN IS UNCERTAIN. PLEASE NOTIFY THE MICROBIOLOGY DEPARTMENT WITHIN ONE WEEK IF SPECIATION AND SENSITIVITIES ARE REQUIRED. Performed at [facility], [address] [place], [place] [id number]    Report Status PENDING  Incomplete  Blood Culture ID Panel (Reflexed)     Status: Abnormal   Collection Time: 03/03/20 12:48 PM  Result Value Ref Range Status   Enterococcus faecalis NOT DETECTED NOT DETECTED Final   Enterococcus Faecium NOT DETECTED NOT DETECTED Final   Listeria monocytogenes NOT DETECTED NOT DETECTED Final   Staphylococcus species DETECTED (A) NOT DETECTED Final    Comment: CRITICAL RESULT CALLED TO, READ  BACK BY AND VERIFIED WITH: PHARMD T JACKY [id number] AT 1043 BY CM    Staphylococcus aureus (BCID) NOT DETECTED NOT DETECTED Final   Staphylococcus epidermidis DETECTED (A) NOT DETECTED Final    Comment: Methicillin (oxacillin) resistant coagulase negative staphylococcus. Possible blood culture contaminant (unless isolated from more than one blood culture draw or clinical case suggests pathogenicity). No antibiotic treatment is indicated for blood  culture contaminants. CRITICAL RESULT CALLED TO, READ BACK BY AND VERIFIED WITH: PHARMD T JACKY [id number] AT 1043 BY CM    Staphylococcus lugdunensis NOT DETECTED NOT DETECTED Final   Streptococcus species NOT DETECTED NOT DETECTED Final   Streptococcus agalactiae NOT DETECTED NOT DETECTED Final   Streptococcus pneumoniae NOT DETECTED NOT DETECTED Final   Streptococcus pyogenes NOT DETECTED NOT DETECTED Final   A.calcoaceticus-baumannii NOT DETECTED NOT DETECTED Final   Bacteroides fragilis NOT DETECTED NOT DETECTED Final   Enterobacterales NOT DETECTED NOT DETECTED Final   Enterobacter cloacae complex NOT DETECTED NOT DETECTED Final   Escherichia coli NOT DETECTED NOT DETECTED Final   Klebsiella aerogenes NOT DETECTED NOT DETECTED Final    Klebsiella oxytoca NOT DETECTED NOT DETECTED Final   Klebsiella pneumoniae NOT DETECTED NOT DETECTED Final   Proteus species NOT DETECTED NOT DETECTED Final   Salmonella species NOT DETECTED NOT DETECTED Final   Serratia marcescens NOT DETECTED NOT DETECTED Final   Haemophilus influenzae NOT DETECTED NOT DETECTED Final   Neisseria meningitidis NOT DETECTED NOT DETECTED Final   Pseudomonas aeruginosa NOT DETECTED NOT DETECTED Final   Stenotrophomonas maltophilia NOT DETECTED NOT DETECTED Final   Candida albicans NOT DETECTED NOT DETECTED Final   Candida auris NOT DETECTED NOT DETECTED Final   Candida glabrata NOT DETECTED NOT DETECTED Final   Candida krusei NOT DETECTED NOT DETECTED Final   Candida parapsilosis NOT DETECTED NOT DETECTED Final   Candida tropicalis NOT DETECTED NOT DETECTED Final   Cryptococcus neoformans/gattii NOT DETECTED NOT DETECTED Final   Methicillin resistance mecA/C DETECTED (A) NOT DETECTED Final    Comment: CRITICAL RESULT CALLED TO, READ BACK BY AND VERIFIED WITH: PHARMD T JACKY [id number] AT 1043 BY CM Performed at [facility], [address] [place], [place] [address]   Urine culture     Status: Abnormal (Preliminary result)   Collection Time: 03/03/20  4:08 PM   Specimen: In/Out Cath Urine  Result Value Ref Range Status   Specimen Description IN/OUT CATH URINE  Final   Special Requests NONE  Final   Culture (A)  Final    >=100,000 COLONIES/mL GRAM NEGATIVE RODS SUSCEPTIBILITIES TO FOLLOW Performed at [facility], [address] [place], [place] [address]    Report Status PENDING  Incomplete  Culture, blood (single)     Status: None (Preliminary result)   Collection Time: 03/03/20  5:14 PM   Specimen: BLOOD  Result Value Ref Range Status   Specimen Description BLOOD LEFT ANTECUBITAL  Final   Special Requests   Final    BOTTLES DRAWN AEROBIC AND ANAEROBIC Blood Culture adequate volume   Culture   Final    NO GROWTH 2 DAYS Performed at  [facility], [address], [place], [place] [address]    Report Status PENDING  Incomplete  Resp Panel by RT-PCR (Flu A&B, Covid) Nasopharyngeal Swab     Status: None   Collection Time: 03/03/20  6:10 PM   Specimen: Nasopharyngeal Swab; Nasopharyngeal(NP) swabs in vial transport medium  Result Value Ref Range Status  SARS Coronavirus 2 by RT PCR NEGATIVE NEGATIVE Final    Comment: (NOTE) SARS-CoV-2 target nucleic acids are NOT DETECTED.  The SARS-CoV-2 RNA is generally detectable in upper respiratory specimens during the acute phase of infection. The lowest concentration of SARS-CoV-2 viral copies this assay can detect is 138 copies/mL. A negative result does not preclude SARS-Cov-2 infection and should not be used as the sole basis for treatment or other patient management decisions. A negative result may occur with  improper specimen collection/handling, submission of specimen other than nasopharyngeal swab, presence of viral mutation(s) within the areas targeted by this assay, and inadequate number of viral copies(<138 copies/mL). A negative result must be combined with clinical observations, patient history, and epidemiological information. The expected result is Negative.  Fact Sheet for Patients:  BloggerCourse.com  Fact Sheet for Healthcare Providers:  SeriousBroker.it  This test is no t yet approved or cleared by the [place] FDA and  has been authorized for detection and/or diagnosis of SARS-CoV-2 by FDA under an Emergency Use Authorization (EUA). This EUA will remain  in effect (meaning this test can be used) for the duration of the COVID-19 declaration under Section 564(b)(1) of the Act, 21 U.S.C.section 360bbb-3(b)(1), unless the authorization is terminated  or revoked sooner.       Influenza A by PCR NEGATIVE NEGATIVE Final   Influenza B by PCR NEGATIVE NEGATIVE Final    Comment: (NOTE) The Xpert  Xpress SARS-CoV-2/FLU/RSV plus assay is intended as an aid in the diagnosis of influenza from Nasopharyngeal swab specimens and should not be used as a sole basis for treatment. Nasal washings and aspirates are unacceptable for Xpert Xpress SARS-CoV-2/FLU/RSV testing.  Fact Sheet for Patients: BloggerCourse.com  Fact Sheet for Healthcare Providers: SeriousBroker.it  This test is not yet approved or cleared by the [place] FDA and has been authorized for detection and/or diagnosis of SARS-CoV-2 by FDA under an Emergency Use Authorization (EUA). This EUA will remain in effect (meaning this test can be used) for the duration of the COVID-19 declaration under Section 564(b)(1) of the Act, 21 U.S.C. section 360bbb-3(b)(1), unless the authorization is terminated or revoked.  Performed at [facility], [address], [place], [place] [address]          Radiology Studies: CT Head Wo Contrast  Result Date: 03/03/2020 CLINICAL DATA:  Delirium and weakness EXAM: CT HEAD WITHOUT CONTRAST TECHNIQUE: Contiguous axial images were obtained from the base of the skull through the vertex without intravenous contrast. COMPARISON:  02/25/2018 FINDINGS: Brain: No evidence of acute infarction, hemorrhage, hydrocephalus, extra-axial collection or mass lesion/mass effect. Vascular: No hyperdense vessel or unexpected calcification. Skull: Normal. Negative for fracture or focal lesion. Sinuses/Orbits: No acute finding. Other: None. IMPRESSION: No acute intracranial abnormality noted. Electronically Signed   By: Alcide Clever M.D.   On: 03/03/2020 13:28   CT ABDOMEN PELVIS W CONTRAST  Result Date: 03/04/2020 CLINICAL DATA:  Abdominal abscess/infection suspected. EXAM: CT ABDOMEN AND PELVIS WITH CONTRAST TECHNIQUE: Multidetector CT imaging of the abdomen and pelvis was performed using the standard protocol following bolus administration of intravenous  contrast. CONTRAST:  OMNIPAQUE IOHEXOL 300 MG/ML  SOLN COMPARISON:  March 02, 2019 FINDINGS: Lower chest: The lung bases are clear. The heart size is normal. Hepatobiliary: The liver is normal. Status post cholecystectomy.There is relatively stable intrahepatic and extrahepatic biliary ductal dilatation. Pancreas: Normal contours without ductal dilatation. No peripancreatic fluid collection. Spleen: Unremarkable. Adrenals/Urinary Tract: --Adrenal glands: Unremarkable. --Right kidney/ureter: There are  nonobstructing right-sided renal nephroliths measuring up to approximately 4 mm. There is a striated appearance of the right kidney on the delayed images, concerning for pyelonephritis. --Left kidney/ureter: No hydronephrosis or radiopaque kidney stones. --Urinary bladder: Bladder is distended. There is mild bladder wall thickening with adjacent fat stranding. Stomach/Bowel: --Stomach/Duodenum: No hiatal hernia or other gastric abnormality. Normal duodenal course and caliber. --Small bowel: Unremarkable. --Colon: Unremarkable. --Appendix: Not visualized. No right lower quadrant inflammation or free fluid. Vascular/Lymphatic: Atherosclerotic calcification is present within the non-aneurysmal abdominal aorta, without hemodynamically significant stenosis. --No retroperitoneal lymphadenopathy. --No mesenteric lymphadenopathy. --No pelvic or inguinal lymphadenopathy. Reproductive: Status post hysterectomy. No adnexal mass. Other: No ascites or free air. The abdominal wall is normal. Musculoskeletal. No acute displaced fractures. There is a growing but benign appearing cystic lesion abutting or within the right gluteus medius muscle. This has demonstrated slow interval growth across prior studies and is almost certainly benign. IMPRESSION: 1. There is a striated appearance of the right kidney on the delayed images, concerning for pyelonephritis. 2. There are nonobstructing right-sided renal nephroliths measuring up to  approximately 4 mm. 3. There is mild bladder wall thickening with adjacent fat stranding, concerning for cystitis. Correlation with urinalysis is recommended. Aortic Atherosclerosis (ICD10-I70.0). Electronically Signed   By: Katherine Mantle M.D.   On: 03/04/2020 03:35   DG Chest Port 1 View  Result Date: 03/03/2020 CLINICAL DATA:  Questionable sepsis and weakness EXAM: PORTABLE CHEST 1 VIEW COMPARISON:  02/25/2020 FINDINGS: Cardiac shadow is stable. Lungs are well aerated bilaterally. No focal infiltrate or sizable effusion is seen. Minimal scarring is noted in the left base. No bony abnormality is noted. IMPRESSION: Minimal left basilar scarring.  No acute abnormality noted. Electronically Signed   By: Alcide Clever M.D.   On: 03/03/2020 13:27        Scheduled Meds: . busPIRone  10 mg Oral BID  . cholecalciferol  1,000 Units Oral Daily  . enoxaparin (LOVENOX) injection  40 mg Subcutaneous Q24H  . insulin aspart  0-15 Units Subcutaneous TID AC & HS  . irbesartan  300 mg Oral Daily  . pantoprazole  40 mg Oral Daily  . traZODone  50 mg Oral QHS  . venlafaxine  37.5 mg Oral Daily   Continuous Infusions: . sodium chloride Stopped (03/04/20 1219)  . cefTRIAXone (ROCEPHIN)  IV 1 g (03/05/20 0835)     LOS: 1 day    Time spent: 39 minutes spent on chart review, discussion with nursing staff, consultants, updating family and interview/physical exam; more than 50% of that time was spent in counseling and/or coordination of care.    Alvira Philips [place], DO [facility] Available via Epic secure chat 7am-7pm After these hours, please refer to coverage provider listed on [email or web address] 03/05/2020, 11:16 AM

## 2020-03-06 DIAGNOSIS — G928 Other toxic encephalopathy: Secondary | ICD-10-CM | POA: Diagnosis not present

## 2020-03-06 LAB — CBC
HCT: 39 % (ref 36.0–46.0)
Hemoglobin: 12.6 g/dL (ref 12.0–15.0)
MCH: 30.1 pg (ref 26.0–34.0)
MCHC: 32.3 g/dL (ref 30.0–36.0)
MCV: 93.1 fL (ref 80.0–100.0)
Platelets: 197 10*3/uL (ref 150–400)
RBC: 4.19 MIL/uL (ref 3.87–5.11)
RDW: 13 % (ref 11.5–15.5)
WBC: 5.9 10*3/uL (ref 4.0–10.5)
nRBC: 0 % (ref 0.0–0.2)

## 2020-03-06 LAB — BASIC METABOLIC PANEL
Anion gap: 9 (ref 5–15)
BUN: 6 mg/dL — ABNORMAL LOW (ref 8–23)
CO2: 28 mmol/L (ref 22–32)
Calcium: 9.2 mg/dL (ref 8.9–10.3)
Chloride: 102 mmol/L (ref 98–111)
Creatinine, Ser: 0.71 mg/dL (ref 0.44–1.00)
GFR, Estimated: >60 mL/min (ref >60)
Glucose, Bld: 173 mg/dL — ABNORMAL HIGH (ref 70–99)
Potassium: 4.4 mmol/L (ref 3.5–5.1)
Sodium: 139 mmol/L (ref 135–145)

## 2020-03-06 LAB — GLUCOSE, CAPILLARY: Glucose-Capillary: 180 mg/dL — ABNORMAL HIGH (ref 70–99)

## 2020-03-06 LAB — MAGNESIUM: Magnesium: 2.1 mg/dL (ref 1.7–2.4)

## 2020-03-06 LAB — CULTURE, BLOOD (SINGLE): Special Requests: ADEQUATE

## 2020-03-06 MED ORDER — CIPROFLOXACIN HCL 500 MG PO TABS: 500.0000 mg | ORAL_TABLET | Freq: Two times a day (BID) | ORAL | 0 refills | Status: AC

## 2020-03-06 MED ORDER — AMLODIPINE BESYLATE 5 MG PO TABS
5.0000 mg | ORAL_TABLET | Freq: Every day | ORAL | Status: DC
  Administered 2020-03-06: 5 mg via ORAL
  Filled 2020-03-06: qty 1

## 2020-03-06 NOTE — Discharge Summary (Signed)
Physician Discharge Summary  [name] DOB: [birth year] DOA: 03/03/2020  PCP: Elfredia Nevins, MD  Admit date: 03/03/2020 Discharge date: 03/06/2020  Admitted From: Home Disposition: Home  Recommendations for Outpatient Follow-up:  1. Follow up with PCP in 1-2 weeks 2. [facility] with [facility] 3. Follow-up with psychiatry, Dr. Jannifer Franklin for bipolar 1 disorder 4. Discharging on ciprofloxacin 500 g p.o. twice daily to complete 14-day course of antibiotics for right pyelonephritis  Home Health: No Equipment/Devices: None  Discharge Condition: Stable CODE STATUS: Full code Diet recommendation: Heart healthy/consistent carbohydrate diet  History of present illness:  [name] is a 65 year old female with past medical history significant for LADA diabetes mellitus, hyperlipidemia, essential hypertension, bipolar disorder type I, anxiety disorder, GERD, diabetic polyneuropathy, chronic abdominal pain/constipation who presented to the ED with progressive weakness and lethargy.  Apparently, while doing errands patient started developing substantial lethargy, weakness with slurred speech and EMS was contacted.  Husband also reports that patient has been more forgetful and anxious over the past 3 days.  Per EMS report, patient's initial systolic blood pressure was in the 60s with a heart rate in the 110s.  Patient was given 1 L of normal saline with improvement of blood pressure to 98/62 and transported to [facility], ED for further evaluation.  In the ED, temperature 98.5 F, HR 87, RR 20, BP 84/53, SPO2 93% on room air.  Sodium 140, potassium 3.6, chloride 106, CO2 24, glucose 44, BUN 28, creatinine 1.74, AST 20, ALT 17, total bilirubin 0.6.  Lactic acid 3.0.  WBC 8.0, hemoglobin 10.5, platelets 182.  EtOH level less than 10.  Ammonia level 31.  CT head without contrast with no acute intracranial abnormality.  Chest x-ray with no focal consolidation.  Covid-19  test negative.  EDP consulted hospital service for further evaluation management of weakness, lethargy and lactic acidosis.  Hospital course:  Acute toxic metabolic encephalopathy, POA Patient presented to ED with 3-day history of worsening confusion, lethargy and slurring of speech.  Chest x-ray with no focal consolidation.  CT head without contrast with no acute intracranial abnormality.  Ammonia level within normal limits.  HIV nonreactive, vitamin B12 239, within normal limits.  CT abdomen pelvis with stranding right kidney consistent with pyelonephritis.  Urinalysis with small leukoesterase, many bacteria 11-20 WBCs consistent with UTI/pyelonephritis.  Patient with glucose 44 on admission, likely contributing factor with poor oral intake.    Sepsis, POA Klebsiella pneumonia urinary tract infection/right pyelonephritis Lactic acidosis, POA Patient complaining of right-sided flank pain.  Lactic acid elevated 3.0.  Urinalysis with small leukoesterase, negative nitrite, many bacteria and 11-20 WBCs.  CT abdomen/pelvis with striated appearance right kidney consistent with pyelonephritis and mild bladder wall thickening consistent with cystitis.  Patient was started on ceftriaxone 1 g IV every 24 hours, urine culture returned back Klebsiella pneumonia and patient will be transition to ciprofloxacin 500 mg p.o. twice daily to complete 14-day course.  [facility].  Positive blood culture 1 out of 4 blood cultures positive for staph epidermidis, likely contaminant.  Patient is afebrile without leukocytosis.  Follow-up finalization of remaining blood cultures that are still pending at time of discharge.  Acute renal failure: Resolved Patient presenting with creatinine 1.74.  Etiology likely secondary to prerenal azotemia with dehydration in the setting of sepsis and poor oral intake as above.  Patient received aggressive IV fluid resuscitation in the ED.  patient's creatinine improved to 0.71 at time  of discharge.  LADA diabetes mellitus Patient glucose 44 on admission, likely secondary to poor oral intake in the setting of insulin use.  Regimen includes Lantus 36u [place] qHS, NovoLog 10-12u qBreakfast, 8-10u qLunch, and 10-12u qDinner, and Metformin 500 mg p.o. twice daily.  May resume home regimen on discharge as now encephalopathy resolved and tolerating diet without issue.  Essential hypertension Home medications include amlodipine 5 mg p.o. daily, Benicar  PO daily, losartan  PO daily.  Patient was notably hypotensive on admission; and antihypertensives were initially held.  May restart on discharge now blood pressure has improved.  Bipolar 1 disorder  Anxiety xanax  QID prn, Effexor 37.5 mg p.o. daily, BuSpar 10 mg p.o. twice daily, gabapentin.  Outpatient follow-up with psychiatry.  GERD: continue PPI  Discharge Diagnoses:  Active Problems:   LADA (latent autoimmune diabetes in adults), managed as type 1 (HCC)   Essential hypertension   Bipolar 1 disorder (HCC)   Chronic constipation   Diabetic polyneuropathy (HCC)   GERD without esophagitis    Discharge Instructions  Discharge Instructions    Ambulatory referral to Psychiatry   Complete by: As directed    Call MD for:  difficulty breathing, headache or visual disturbances   Complete by: As directed    Call MD for:  extreme fatigue   Complete by: As directed    Call MD for:  persistant dizziness or light-headedness   Complete by: As directed    Call MD for:  persistant nausea and vomiting   Complete by: As directed    Call MD for:  severe uncontrolled pain   Complete by: As directed    Call MD for:  temperature >100.4   Complete by: As directed    Diet - low sodium heart healthy   Complete by: As directed    Increase activity slowly   Complete by: As directed      Allergies as of 03/06/2020      Reactions   Abilify [aripiprazole]    Patient is  intolerant   Phenergan [promethazine] Diarrhea, Nausea And Vomiting   Reglan [metoclopramide] Other (See Comments)   Chest pains      Medication List    STOP taking these medications   omeprazole 40 MG capsule Commonly known as: PRILOSEC     TAKE these medications   acetaminophen 500 MG tablet Commonly known as: TYLENOL Take 1,000 mg by mouth every 6 (six) hours as needed for mild pain or headache.   ALPRAZolam 1 MG tablet Commonly known as: XANAX Take 1 mg by mouth 4 (four) times daily as needed for anxiety.   AMBULATORY NON FORMULARY MEDICATION Squatty Potty x 1   amLODipine 5 MG tablet Commonly known as: NORVASC Take 5 mg by mouth daily.   BD Pen Needle Nano U/F 32G X 4 MM Misc Generic drug: Insulin Pen Needle Use with insulin pen 4 times a day   Benicar 40 MG tablet Generic drug: olmesartan Take 40 mg by mouth daily.   busPIRone 10 MG tablet Commonly known as: BUSPAR Take 10 mg by mouth in the morning and at bedtime.   cholecalciferol 25 MCG (1000 UNIT) tablet Commonly known as: VITAMIN D3 Take 1,000 Units by mouth daily.   ciprofloxacin 500 MG tablet Commonly known as: Cipro Take 1 tablet (500 mg total) by mouth 2 (two) times daily for 11 days.   COLACE PO Take 100 mg by mouth daily.   esomeprazole 40 MG capsule Commonly known as: NEXIUM Take  40 mg by mouth 2 (two) times daily.   freestyle lancets USE TO TEST BLOOD SUGAR 4 TO 6 TIMES DAILY AS INSTRUCTED   FREESTYLE LITE test strip Generic drug: glucose blood USE TO CHECK BLOOD SUGAR 6 TIMES PER DAY   gabapentin 800 MG tablet Commonly known as: NEURONTIN Take 800 mg by mouth 3 (three) times daily.   glucagon 1 MG injection Inject 1 mg into the muscle once as needed for up to 1 dose.   Insulin Syringes (Disposable) U-100 1 ML Misc To use for insulin injection..   Lantus SoloStar 100 UNIT/ML Solostar Pen Generic drug: insulin glargine Inject 32 Units into the skin daily. What changed:    how much to take  when to take this   losartan 25 MG tablet Commonly known as: COZAAR Take 25 mg by mouth daily.   metFORMIN 500 MG tablet Commonly known as: GLUCOPHAGE TAKE 1 TABLET TWICE A DAY WITH MEALS   nortriptyline 50 MG capsule Commonly known as: PAMELOR Take 50 mg by mouth at bedtime.   NovoLOG FlexPen 100 UNIT/ML FlexPen Generic drug: insulin aspart Inject 10 -12 Units into the skin daily after breakfast AND 8 -10 Units daily before lunch AND 10-12 Units daily before supper.  201-250=2 units, 251-300=3 units, 301-350=4units, >350= 5 unitsMax daily 40 units.   ondansetron 4 MG disintegrating tablet Commonly known as: ZOFR[place] 1 TO 2 TABLETS ON TONGUE EVERY 4 TO 6 HOURS AS NEEDED FOR NAUSEA What changed:   how much to take  how to take this  when to take this  reasons to take this  additional instructions   Oscimin 0.125 MG Subl Generic drug: Hyoscyamine Sulfate SL DISSOLVE 1 TABLET UNDER THE TONGUE EVERY 4 HOURS AS NEEDED (NEED APPOINTMENT)   Probiotic Caps Take 1 capsule by mouth daily.   SYSTANE BALANCE OP Place 1 drop into both eyes daily as needed (dry eyes).   traZODone 100 MG tablet Commonly known as: DESYREL 1  qhs  May increse to 2  qhs  After 1 week What changed:   how much to take  how to take this  when to take this   venlafaxine 37.5 MG tablet Commonly known as: EFFEXOR Take 37.5 mg by mouth daily.   vitamin B-12 1000 MCG tablet Commonly known as: CYANOCOBALAMIN Take 1,000 mcg by mouth daily.       Follow-up Information    Thedore Mins, MD. Schedule an appointment as soon as possible for a visit in 3 week(s).   Specialty: Psychiatry Contact information: [address] [place] [address] [phone]        Elfredia Nevins, MD. Schedule an appointment as soon as possible for a visit in 1 week(s).   Specialty: Internal Medicine Contact information: [address] [place]  [address] [phone]              Allergies  Allergen Reactions  . Abilify [Aripiprazole]     Patient is intolerant  . Phenergan [Promethazine] Diarrhea and Nausea And Vomiting  . Reglan [Metoclopramide] Other (See Comments)    Chest pains    Consultations:  Psychiatry, Dr. Jannifer Franklin   Procedures/Studies: CT Head Wo Contrast  Result Date: 03/03/2020 CLINICAL DATA:  Delirium and weakness EXAM: CT HEAD WITHOUT CONTRAST TECHNIQUE: Contiguous axial images were obtained from the base of the skull through the vertex without intravenous contrast. COMPARISON:  02/25/2018 FINDINGS: Brain: No evidence of acute infarction, hemorrhage, hydrocephalus, extra-axial collection or mass lesion/mass effect. Vascular: No  hyperdense vessel or unexpected calcification. Skull: Normal. Negative for fracture or focal lesion. Sinuses/Orbits: No acute finding. Other: None. IMPRESSION: No acute intracranial abnormality noted. Electronically Signed   By: Alcide Clever M.D.   On: 03/03/2020 13:28   CT ABDOMEN PELVIS W CONTRAST  Result Date: 03/04/2020 CLINICAL DATA:  Abdominal abscess/infection suspected. EXAM: CT ABDOMEN AND PELVIS WITH CONTRAST TECHNIQUE: Multidetector CT imaging of the abdomen and pelvis was performed using the standard protocol following bolus administration of intravenous contrast. CONTRAST:  OMNIPAQUE IOHEXOL 300 MG/ML  SOLN COMPARISON:  March 02, 2019 FINDINGS: Lower chest: The lung bases are clear. The heart size is normal. Hepatobiliary: The liver is normal. Status post cholecystectomy.There is relatively stable intrahepatic and extrahepatic biliary ductal dilatation. Pancreas: Normal contours without ductal dilatation. No peripancreatic fluid collection. Spleen: Unremarkable. Adrenals/Urinary Tract: --Adrenal glands: Unremarkable. --Right kidney/ureter: There are nonobstructing right-sided renal nephroliths measuring up to approximately 4 mm. There is a striated appearance of the right  kidney on the delayed images, concerning for pyelonephritis. --Left kidney/ureter: No hydronephrosis or radiopaque kidney stones. --Urinary bladder: Bladder is distended. There is mild bladder wall thickening with adjacent fat stranding. Stomach/Bowel: --Stomach/Duodenum: No hiatal hernia or other gastric abnormality. Normal duodenal course and caliber. --Small bowel: Unremarkable. --Colon: Unremarkable. --Appendix: Not visualized. No right lower quadrant inflammation or free fluid. Vascular/Lymphatic: Atherosclerotic calcification is present within the non-aneurysmal abdominal aorta, without hemodynamically significant stenosis. --No retroperitoneal lymphadenopathy. --No mesenteric lymphadenopathy. --No pelvic or inguinal lymphadenopathy. Reproductive: Status post hysterectomy. No adnexal mass. Other: No ascites or free air. The abdominal wall is normal. Musculoskeletal. No acute displaced fractures. There is a growing but benign appearing cystic lesion abutting or within the right gluteus medius muscle. This has demonstrated slow interval growth across prior studies and is almost certainly benign. IMPRESSION: 1. There is a striated appearance of the right kidney on the delayed images, concerning for pyelonephritis. 2. There are nonobstructing right-sided renal nephroliths measuring up to approximately 4 mm. 3. There is mild bladder wall thickening with adjacent fat stranding, concerning for cystitis. Correlation with urinalysis is recommended. Aortic Atherosclerosis (ICD10-I70.0). Electronically Signed   By: Katherine Mantle M.D.   On: 03/04/2020 03:35   DG Chest Port 1 View  Result Date: 03/03/2020 CLINICAL DATA:  Questionable sepsis and weakness EXAM: PORTABLE CHEST 1 VIEW COMPARISON:  02/25/2020 FINDINGS: Cardiac shadow is stable. Lungs are well aerated bilaterally. No focal infiltrate or sizable effusion is seen. Minimal scarring is noted in the left base. No bony abnormality is noted. IMPRESSION: Minimal  left basilar scarring.  No acute abnormality noted. Electronically Signed   By: Alcide Clever M.D.   On: 03/03/2020 13:27   DG ABD ACUTE 2+V W 1V CHEST  Result Date: 02/27/2020 CLINICAL DATA:  Stomachache, left lower quadrant pain EXAM: DG ABDOMEN ACUTE WITH 1 VIEW CHEST COMPARISON:  07/31/2019 FINDINGS: No focal consolidation, pleural effusion or pneumothorax. Mild atelectasis in the lingula. Normal cardiomediastinal silhouette. Large amount of stool throughout the colon. No bowel dilatation to suggest obstruction. No air-fluid levels. No pneumoperitoneum, portal venous gas, or pneumatosis. No acute osseous abnormality. Lower lumbar spine spondylosis. IMPRESSION: Large amount of stool throughout the colon. No acute cardiopulmonary disease. Electronically Signed   By: Elige Ko   On: 02/27/2020 07:39      Subjective: Patient seen and examined bedside, resting comfortably.  No specific complaints, ready for discharge home.  Patient denies headache, no fever/chills/night sweats, no nausea/vomiting/diarrhea, no chest pain, palpitations, no shortness of breath, no  abdominal pain, no weakness, no fatigue, no paresthesias.  No acute events overnight per nursing staff.  Discharge Exam: Vitals:   03/06/20 0410 03/06/20 0726  BP: (!) 147/83 (!) 186/94  Pulse: 79 96  Resp: 14 18  Temp: 98 F (36.7 C) 98.5 F (36.9 C)  SpO2: 100% 99%   Vitals:   03/05/20 1954 03/06/20 0030 03/06/20 0410 03/06/20 0726  BP: (!) 142/82 (!) 142/79 (!) 147/83 (!) 186/94  Pulse: 90 83 79 96  Resp: 14 15 14 18   Temp:  (!) 97.4 F (36.3 C) 98 F (36.7 C) 98.5 F (36.9 C)  TempSrc:  Oral Oral Oral  SpO2: 97% 100% 100% 99%  Weight:      Height:        General: Pt is alert, awake, not in acute distress Cardiovascular: RRR, S1/S2 +, no rubs, no gallops Respiratory: CTA bilaterally, no wheezing, no rhonchi Abdominal: Soft, NT, ND, bowel sounds + Extremities: no edema, no cyanosis    The results of significant  diagnostics from this hospitalization (including imaging, microbiology, ancillary and laboratory) are listed below for reference.     Microbiology: Recent Results (from the past 240 hour(s))  Blood culture (routine single)     Status: Abnormal   Collection Time: 03/03/20 12:48 PM   Specimen: BLOOD LEFT FOREARM  Result Value Ref Range Status   Specimen Description BLOOD LEFT FOREARM  Final   Special Requests   Final    BOTTLES DRAWN AEROBIC AND ANAEROBIC Blood Culture adequate volume   Culture  Setup Time   Final    GRAM POSITIVE COCCI IN CLUSTERS IN BOTH AEROBIC AND ANAEROBIC BOTTLES CRITICAL RESULT CALLED TO, READ BACK BY AND VERIFIED WITH: PHARMD T JACKY 05/03/20 AT 1043 AM BY CM    Culture (A)  Final    STAPHYLOCOCCUS EPIDERMIDIS THE SIGNIFICANCE OF ISOLATING THIS ORGANISM FROM A SINGLE SET OF BLOOD CULTURES WHEN MULTIPLE SETS ARE DRAWN IS UNCERTAIN. PLEASE NOTIFY THE MICROBIOLOGY DEPARTMENT WITHIN ONE WEEK IF SPECIATION AND SENSITIVITIES ARE REQUIRED. Performed at [facility], [address] [place], [place]    Report Status 03/06/2020 FINAL  Final  Blood Culture ID Panel (Reflexed)     Status: Abnormal   Collection Time: 03/03/20 12:48 PM  Result Value Ref Range Status   Enterococcus faecalis NOT DETECTED NOT DETECTED Final   Enterococcus Faecium NOT DETECTED NOT DETECTED Final   Listeria monocytogenes NOT DETECTED NOT DETECTED Final   Staphylococcus species DETECTED (A) NOT DETECTED Final    Comment: CRITICAL RESULT CALLED TO, READ BACK BY AND VERIFIED WITH: PHARMD T JACKY 05/03/20 AT 1043 BY CM    Staphylococcus aureus (BCID) NOT DETECTED NOT DETECTED Final   Staphylococcus epidermidis DETECTED (A) NOT DETECTED Final    Comment: Methicillin (oxacillin) resistant coagulase negative staphylococcus. Possible blood culture contaminant (unless isolated from more than one blood culture draw or clinical case suggests pathogenicity). No antibiotic treatment is  indicated for blood  culture contaminants. CRITICAL RESULT CALLED TO, READ BACK BY AND VERIFIED WITH: PHARMD T JACKY [id number] AT 1043 BY CM    Staphylococcus lugdunensis NOT DETECTED NOT DETECTED Final   Streptococcus species NOT DETECTED NOT DETECTED Final   Streptococcus agalactiae NOT DETECTED NOT DETECTED Final   Streptococcus pneumoniae NOT DETECTED NOT DETECTED Final   Streptococcus pyogenes NOT DETECTED NOT DETECTED Final   A.calcoaceticus-baumannii NOT DETECTED NOT DETECTED Final   Bacteroides fragilis NOT DETECTED NOT DETECTED Final   Enterobacterales NOT DETECTED NOT  DETECTED Final   Enterobacter cloacae complex NOT DETECTED NOT DETECTED Final   Escherichia coli NOT DETECTED NOT DETECTED Final   Klebsiella aerogenes NOT DETECTED NOT DETECTED Final   Klebsiella oxytoca NOT DETECTED NOT DETECTED Final   Klebsiella pneumoniae NOT DETECTED NOT DETECTED Final   Proteus species NOT DETECTED NOT DETECTED Final   Salmonella species NOT DETECTED NOT DETECTED Final   Serratia marcescens NOT DETECTED NOT DETECTED Final   Haemophilus influenzae NOT DETECTED NOT DETECTED Final   Neisseria meningitidis NOT DETECTED NOT DETECTED Final   Pseudomonas aeruginosa NOT DETECTED NOT DETECTED Final   Stenotrophomonas maltophilia NOT DETECTED NOT DETECTED Final   Candida albicans NOT DETECTED NOT DETECTED Final   Candida auris NOT DETECTED NOT DETECTED Final   Candida glabrata NOT DETECTED NOT DETECTED Final   Candida krusei NOT DETECTED NOT DETECTED Final   Candida parapsilosis NOT DETECTED NOT DETECTED Final   Candida tropicalis NOT DETECTED NOT DETECTED Final   Cryptococcus neoformans/gattii NOT DETECTED NOT DETECTED Final   Methicillin resistance mecA/C DETECTED (A) NOT DETECTED Final    Comment: CRITICAL RESULT CALLED TO, READ BACK BY AND VERIFIED WITH: PHARMD T JACKY [id number] AT 1043 BY CM Performed at [facility], [address] [place] [address], [place], [place] [address]   Urine culture     Status:  Abnormal   Collection Time: 03/03/20  4:08 PM   Specimen: In/Out Cath Urine  Result Value Ref Range Status   Specimen Description IN/OUT CATH URINE  Final   Special Requests   Final    NONE Performed at [facility], [address] [place], [place] [address]    Culture >=100,000 COLONIES/mL KLEBSIELLA PNEUMONIAE (A)  Final   Report Status 03/05/2020 FINAL  Final   Organism ID, Bacteria KLEBSIELLA PNEUMONIAE (A)  Final      Susceptibility   Klebsiella pneumoniae - MIC*    AMPICILLIN RESISTANT Resistant     CEFAZOLIN <=4 SENSITIVE Sensitive     CEFEPIME <=0.12 SENSITIVE Sensitive     CEFTRIAXONE <=0.25 SENSITIVE Sensitive     CIPROFLOXACIN <=0.25 SENSITIVE Sensitive     GENTAMICIN <=1 SENSITIVE Sensitive     IMIPENEM <=0.25 SENSITIVE Sensitive     NITROFURANTOIN 128 RESISTANT Resistant     TRIMETH/SULFA <=20 SENSITIVE Sensitive     AMPICILLIN/SULBACTAM <=2 SENSITIVE Sensitive     PIP/TAZO <=4 SENSITIVE Sensitive     * >=100,000 COLONIES/mL KLEBSIELLA PNEUMONIAE  Culture, blood (single)     Status: None (Preliminary result)   Collection Time: 03/03/20  5:14 PM   Specimen: BLOOD  Result Value Ref Range Status   Specimen Description BLOOD LEFT ANTECUBITAL  Final   Special Requests   Final    BOTTLES DRAWN AEROBIC AND ANAEROBIC Blood Culture adequate volume   Culture   Final    NO GROWTH 3 DAYS Performed at [facility], [address] [place], [place] [address]    Report Status PENDING  Incomplete  Resp Panel by RT-PCR (Flu A&B, Covid) Nasopharyngeal Swab     Status: None   Collection Time: 03/03/20  6:10 PM   Specimen: Nasopharyngeal Swab; Nasopharyngeal(NP) swabs in vial transport medium  Result Value Ref Range Status   SARS Coronavirus 2 by RT PCR NEGATIVE NEGATIVE Final    Comment: (NOTE) SARS-CoV-2 target nucleic acids are NOT DETECTED.  The SARS-CoV-2 RNA is generally detectable in upper respiratory specimens during the acute phase of infection. The  lowest concentration of SARS-CoV-2 viral copies  this assay can detect is 138 copies/mL. A negative result does not preclude SARS-Cov-2 infection and should not be used as the sole basis for treatment or other patient management decisions. A negative result may occur with  improper specimen collection/handling, submission of specimen other than nasopharyngeal swab, presence of viral mutation(s) within the areas targeted by this assay, and inadequate number of viral copies(<138 copies/mL). A negative result must be combined with clinical observations, patient history, and epidemiological information. The expected result is Negative.  Fact Sheet for Patients:  BloggerCourse.com  Fact Sheet for Healthcare Providers:  SeriousBroker.it  This test is no t yet approved or cleared by the [place] FDA and  has been authorized for detection and/or diagnosis of SARS-CoV-2 by FDA under an Emergency Use Authorization (EUA). This EUA will remain  in effect (meaning this test can be used) for the duration of the COVID-19 declaration under Section 564(b)(1) of the Act, 21 U.S.C.section 360bbb-3(b)(1), unless the authorization is terminated  or revoked sooner.       Influenza A by PCR NEGATIVE NEGATIVE Final   Influenza B by PCR NEGATIVE NEGATIVE Final    Comment: (NOTE) The Xpert Xpress SARS-CoV-2/FLU/RSV plus assay is intended as an aid in the diagnosis of influenza from Nasopharyngeal swab specimens and should not be used as a sole basis for treatment. Nasal washings and aspirates are unacceptable for Xpert Xpress SARS-CoV-2/FLU/RSV testing.  Fact Sheet for Patients: BloggerCourse.com  Fact Sheet for Healthcare Providers: SeriousBroker.it  This test is not yet approved or cleared by the [place] FDA and has been authorized for detection and/or diagnosis of SARS-CoV-2 by FDA under  an Emergency Use Authorization (EUA). This EUA will remain in effect (meaning this test can be used) for the duration of the COVID-19 declaration under Section 564(b)(1) of the Act, 21 U.S.C. section 360bbb-3(b)(1), unless the authorization is terminated or revoked.  Performed at [facility], [address], [place], [place] [address]      Labs: BNP (last 3 results) No results for input(s): BNP in the last 8760 hours. Basic Metabolic Panel: Recent Labs  Lab 03/03/20 1248 03/03/20 2035 03/04/20 0459 03/05/20 0304 03/06/20 0229  NA 140 142 138 138 139  K 3.6 3.0* 4.3 3.9 4.4  CL 106 113* 106 102 102  CO2 24 19* GLUCOSE 44* 117* 133* 159* 173*  BUN 28* 19 11 7* 6*  CREATININE 1.74* 0.84 0.67 0.62 0.71  CALCIUM 8.2* 6.7* 8.3* 8.8* 9.2  MG  --   --  1.4* 2.1 2.1  PHOS  --  2.2*  --   --   --    Liver Function Tests: Recent Labs  Lab 03/03/20 1248 03/03/20 2035 03/04/20 0459  AST 20  --  17  ALT 17  --  16  ALKPHOS 61  --  63  BILITOT 0.6  --  0.9  PROT 5.4*  --  5.7*  ALBUMIN 3.2* 2.9* 3.3*   No results for input(s): LIPASE, AMYLASE in the last 168 hours. Recent Labs  Lab 03/03/20 1357  AMMONIA 31   CBC: Recent Labs  Lab 03/03/20 1248 03/04/20 0459 03/05/20 0304 03/06/20 0229  WBC 8.0 10.4 6.5 5.9  NEUTROABS 5.1 7.6  --   --   HGB 10.5* 11.1* 12.4 12.6  HCT 33.2* 33.4* 39.3 39.0  MCV 95.4 92.3 94.0 93.1  PLT 182 177 197 197   Cardiac Enzymes: No results for input(s): CKTOTAL, CKMB, CKMBINDEX, TROPONINI  in the last 168 hours. BNP: Invalid input(s): POCBNP CBG: Recent Labs  Lab 03/05/20 0603 03/05/20 1249 03/05/20 1632 03/05/20 2108 03/06/20 0632  GLUCAP 196* 171* 194* 195* 180*   D-Dimer No results for input(s): DDIMER in the last 72 hours. Hgb A1c Recent Labs    03/03/20 2035  HGBA1C 5.0   Lipid Profile No results for input(s): CHOL, HDL, LDLCALC, TRIG, CHOLHDL, LDLDIRECT in the last 72 hours. Thyroid function  studies Recent Labs    03/03/20 1711  TSH 0.533   Anemia work up Recent Labs    03/03/20 2035  VITAMINB12 239   Urinalysis    Component Value Date/Time   COLORURINE YELLOW 03/03/2020 1655   APPEARANCEUR HAZY (A) 03/03/2020 1655   LABSPEC 1.005 03/03/2020 1655   PHURINE 5.0 03/03/2020 1655   GLUCOSEU NEGATIVE 03/03/2020 1655   HGBUR NEGATIVE 03/03/2020 1655   BILIRUBINUR NEGATIVE 03/03/2020 1655   KETONESUR NEGATIVE 03/03/2020 1655   PROTEINUR NEGATIVE 03/03/2020 1655   UROBILINOGEN 0.2 04/15/2012 0907   NITRITE NEGATIVE 03/03/2020 1655   LEUKOCYTESUR SMALL (A) 03/03/2020 1655   Sepsis Labs Invalid input(s): PROCALCITONIN,  WBC,  LACTICIDVEN Microbiology Recent Results (from the past 240 hour(s))  Blood culture (routine single)     Status: Abnormal   Collection Time: 03/03/20 12:48 PM   Specimen: BLOOD LEFT FOREARM  Result Value Ref Range Status   Specimen Description BLOOD LEFT FOREARM  Final   Special Requests   Final    BOTTLES DRAWN AEROBIC AND ANAEROBIC Blood Culture adequate volume   Culture  Setup Time   Final    GRAM POSITIVE COCCI IN CLUSTERS IN BOTH AEROBIC AND ANAEROBIC BOTTLES CRITICAL RESULT CALLED TO, READ BACK BY AND VERIFIED WITH: PHARMD T JACKY [id number] AT 1043 AM BY CM    Culture (A)  Final    STAPHYLOCOCCUS EPIDERMIDIS THE SIGNIFICANCE OF ISOLATING THIS ORGANISM FROM A SINGLE SET OF BLOOD CULTURES WHEN MULTIPLE SETS ARE DRAWN IS UNCERTAIN. PLEASE NOTIFY THE MICROBIOLOGY DEPARTMENT WITHIN ONE WEEK IF SPECIATION AND SENSITIVITIES ARE REQUIRED. Performed at [facility], [address] [place], [place] [address]    Report Status 03/06/2020 FINAL  Final  Blood Culture ID Panel (Reflexed)     Status: Abnormal   Collection Time: 03/03/20 12:48 PM  Result Value Ref Range Status   Enterococcus faecalis NOT DETECTED NOT DETECTED Final   Enterococcus Faecium NOT DETECTED NOT DETECTED Final   Listeria monocytogenes NOT DETECTED NOT DETECTED Final    Staphylococcus species DETECTED (A) NOT DETECTED Final    Comment: CRITICAL RESULT CALLED TO, READ BACK BY AND VERIFIED WITH: PHARMD T JACKY [id number] AT 1043 BY CM    Staphylococcus aureus (BCID) NOT DETECTED NOT DETECTED Final   Staphylococcus epidermidis DETECTED (A) NOT DETECTED Final    Comment: Methicillin (oxacillin) resistant coagulase negative staphylococcus. Possible blood culture contaminant (unless isolated from more than one blood culture draw or clinical case suggests pathogenicity). No antibiotic treatment is indicated for blood  culture contaminants. CRITICAL RESULT CALLED TO, READ BACK BY AND VERIFIED WITH: PHARMD T JACKY [id number] AT 1043 BY CM    Staphylococcus lugdunensis NOT DETECTED NOT DETECTED Final   Streptococcus species NOT DETECTED NOT DETECTED Final   Streptococcus agalactiae NOT DETECTED NOT DETECTED Final   Streptococcus pneumoniae NOT DETECTED NOT DETECTED Final   Streptococcus pyogenes NOT DETECTED NOT DETECTED Final   A.calcoaceticus-baumannii NOT DETECTED NOT DETECTED Final   Bacteroides fragilis NOT DETECTED NOT DETECTED Final   Enterobacterales  NOT DETECTED NOT DETECTED Final   Enterobacter cloacae complex NOT DETECTED NOT DETECTED Final   Escherichia coli NOT DETECTED NOT DETECTED Final   Klebsiella aerogenes NOT DETECTED NOT DETECTED Final   Klebsiella oxytoca NOT DETECTED NOT DETECTED Final   Klebsiella pneumoniae NOT DETECTED NOT DETECTED Final   Proteus species NOT DETECTED NOT DETECTED Final   Salmonella species NOT DETECTED NOT DETECTED Final   Serratia marcescens NOT DETECTED NOT DETECTED Final   Haemophilus influenzae NOT DETECTED NOT DETECTED Final   Neisseria meningitidis NOT DETECTED NOT DETECTED Final   Pseudomonas aeruginosa NOT DETECTED NOT DETECTED Final   Stenotrophomonas maltophilia NOT DETECTED NOT DETECTED Final   Candida albicans NOT DETECTED NOT DETECTED Final   Candida auris NOT DETECTED NOT DETECTED Final   Candida glabrata NOT  DETECTED NOT DETECTED Final   Candida krusei NOT DETECTED NOT DETECTED Final   Candida parapsilosis NOT DETECTED NOT DETECTED Final   Candida tropicalis NOT DETECTED NOT DETECTED Final   Cryptococcus neoformans/gattii NOT DETECTED NOT DETECTED Final   Methicillin resistance mecA/C DETECTED (A) NOT DETECTED Final    Comment: CRITICAL RESULT CALLED TO, READ BACK BY AND VERIFIED WITH: PHARMD T JACKY [id number] AT 1043 BY CM Performed at [facility], [address] [place], [place] [address]   Urine culture     Status: Abnormal   Collection Time: 03/03/20  4:08 PM   Specimen: In/Out Cath Urine  Result Value Ref Range Status   Specimen Description IN/OUT CATH URINE  Final   Special Requests   Final    NONE Performed at [facility], [address] [place], [place] [address]    Culture >=100,000 COLONIES/mL KLEBSIELLA PNEUMONIAE (A)  Final   Report Status 03/05/2020 FINAL  Final   Organism ID, Bacteria KLEBSIELLA PNEUMONIAE (A)  Final      Susceptibility   Klebsiella pneumoniae - MIC*    AMPICILLIN RESISTANT Resistant     CEFAZOLIN <=4 SENSITIVE Sensitive     CEFEPIME <=0.12 SENSITIVE Sensitive     CEFTRIAXONE <=0.25 SENSITIVE Sensitive     CIPROFLOXACIN <=0.25 SENSITIVE Sensitive     GENTAMICIN <=1 SENSITIVE Sensitive     IMIPENEM <=0.25 SENSITIVE Sensitive     NITROFURANTOIN 128 RESISTANT Resistant     TRIMETH/SULFA <=20 SENSITIVE Sensitive     AMPICILLIN/SULBACTAM <=2 SENSITIVE Sensitive     PIP/TAZO <=4 SENSITIVE Sensitive     * >=100,000 COLONIES/mL KLEBSIELLA PNEUMONIAE  Culture, blood (single)     Status: None (Preliminary result)   Collection Time: 03/03/20  5:14 PM   Specimen: BLOOD  Result Value Ref Range Status   Specimen Description BLOOD LEFT ANTECUBITAL  Final   Special Requests   Final    BOTTLES DRAWN AEROBIC AND ANAEROBIC Blood Culture adequate volume   Culture   Final    NO GROWTH 3 DAYS Performed at [facility], [address],  [place], [place] [address]    Report Status PENDING  Incomplete  Resp Panel by RT-PCR (Flu A&B, Covid) Nasopharyngeal Swab     Status: None   Collection Time: 03/03/20  6:10 PM   Specimen: Nasopharyngeal Swab; Nasopharyngeal(NP) swabs in vial transport medium  Result Value Ref Range Status   SARS Coronavirus 2 by RT PCR NEGATIVE NEGATIVE Final    Comment: (NOTE) SARS-CoV-2 target nucleic acids are NOT DETECTED.  The SARS-CoV-2 RNA is generally detectable in upper respiratory specimens during the acute phase of infection. The lowest concentration of  SARS-CoV-2 viral copies this assay can detect is 138 copies/mL. A negative result does not preclude SARS-Cov-2 infection and should not be used as the sole basis for treatment or other patient management decisions. A negative result may occur with  improper specimen collection/handling, submission of specimen other than nasopharyngeal swab, presence of viral mutation(s) within the areas targeted by this assay, and inadequate number of viral copies(<138 copies/mL). A negative result must be combined with clinical observations, patient history, and epidemiological information. The expected result is Negative.  Fact Sheet for Patients:  BloggerCourse.comhttps://www.fda.gov/media/152166/download  Fact Sheet for Healthcare Providers:  SeriousBroker.ithttps://www.fda.gov/media/152162/download  This test is no t yet approved or cleared by the [place] FDA and  has been authorized for detection and/or diagnosis of SARS-CoV-2 by FDA under an Emergency Use Authorization (EUA). This EUA will remain  in effect (meaning this test can be used) for the duration of the COVID-19 declaration under Section 564(b)(1) of the Act, 21 U.S.C.section 360bbb-3(b)(1), unless the authorization is terminated  or revoked sooner.       Influenza A by PCR NEGATIVE NEGATIVE Final   Influenza B by PCR NEGATIVE NEGATIVE Final    Comment: (NOTE) The Xpert Xpress SARS-CoV-2/FLU/RSV plus assay is  intended as an aid in the diagnosis of influenza from Nasopharyngeal swab specimens and should not be used as a sole basis for treatment. Nasal washings and aspirates are unacceptable for Xpert Xpress SARS-CoV-2/FLU/RSV testing.  Fact Sheet for Patients: BloggerCourse.comhttps://www.fda.gov/media/152166/download  Fact Sheet for Healthcare Providers: SeriousBroker.ithttps://www.fda.gov/media/152162/download  This test is not yet approved or cleared by the [place] FDA and has been authorized for detection and/or diagnosis of SARS-CoV-2 by FDA under an Emergency Use Authorization (EUA). This EUA will remain in effect (meaning this test can be used) for the duration of the COVID-19 declaration under Section 564(b)(1) of the Act, 21 U.S.C. section 360bbb-3(b)(1), unless the authorization is terminated or revoked.  Performed at [facility], [address] [place], [place] [id number]      Time coordinating discharge: Over 30 minutes  SIGNED:   Alvira PhilipsEric J [place], DO  [facility] 03/06/2020, 9:49 AM

## 2020-03-07 DIAGNOSIS — B961 Klebsiella pneumoniae [K. pneumoniae] as the cause of diseases classified elsewhere: Secondary | ICD-10-CM | POA: Diagnosis not present

## 2020-03-07 DIAGNOSIS — F319 Bipolar disorder, unspecified: Secondary | ICD-10-CM | POA: Diagnosis not present

## 2020-03-07 DIAGNOSIS — N10 Acute tubulo-interstitial nephritis: Secondary | ICD-10-CM | POA: Diagnosis not present

## 2020-03-07 DIAGNOSIS — I10 Essential (primary) hypertension: Secondary | ICD-10-CM | POA: Diagnosis not present

## 2020-03-07 DIAGNOSIS — E1040 Type 1 diabetes mellitus with diabetic neuropathy, unspecified: Secondary | ICD-10-CM | POA: Diagnosis not present

## 2020-03-07 DIAGNOSIS — Z9181 History of falling: Secondary | ICD-10-CM | POA: Diagnosis not present

## 2020-03-07 DIAGNOSIS — E785 Hyperlipidemia, unspecified: Secondary | ICD-10-CM | POA: Diagnosis not present

## 2020-03-07 DIAGNOSIS — Z87440 Personal history of urinary (tract) infections: Secondary | ICD-10-CM | POA: Diagnosis not present

## 2020-03-07 DIAGNOSIS — Z7984 Long term (current) use of oral hypoglycemic drugs: Secondary | ICD-10-CM | POA: Diagnosis not present

## 2020-03-07 DIAGNOSIS — K581 Irritable bowel syndrome with constipation: Secondary | ICD-10-CM | POA: Diagnosis not present

## 2020-03-07 DIAGNOSIS — Z87442 Personal history of urinary calculi: Secondary | ICD-10-CM | POA: Diagnosis not present

## 2020-03-07 DIAGNOSIS — A4159 Other Gram-negative sepsis: Secondary | ICD-10-CM | POA: Diagnosis not present

## 2020-03-07 DIAGNOSIS — Z9049 Acquired absence of other specified parts of digestive tract: Secondary | ICD-10-CM | POA: Diagnosis not present

## 2020-03-07 DIAGNOSIS — Z90710 Acquired absence of both cervix and uterus: Secondary | ICD-10-CM | POA: Diagnosis not present

## 2020-03-07 DIAGNOSIS — K219 Gastro-esophageal reflux disease without esophagitis: Secondary | ICD-10-CM | POA: Diagnosis not present

## 2020-03-07 DIAGNOSIS — F419 Anxiety disorder, unspecified: Secondary | ICD-10-CM | POA: Diagnosis not present

## 2020-03-08 ENCOUNTER — Ambulatory Visit (INDEPENDENT_AMBULATORY_CARE_PROVIDER_SITE_OTHER): Payer: Medicare Other | Admitting: Internal Medicine

## 2020-03-08 ENCOUNTER — Other Ambulatory Visit: Payer: Self-pay

## 2020-03-08 ENCOUNTER — Encounter: Payer: Self-pay | Admitting: Internal Medicine

## 2020-03-08 VITALS — BP 120/82 | HR 98 | Ht 64.0 in | Wt 169.6 lb

## 2020-03-08 DIAGNOSIS — E139 Other specified diabetes mellitus without complications: Secondary | ICD-10-CM | POA: Diagnosis not present

## 2020-03-08 DIAGNOSIS — G63 Polyneuropathy in diseases classified elsewhere: Secondary | ICD-10-CM | POA: Diagnosis not present

## 2020-03-08 DIAGNOSIS — E1040 Type 1 diabetes mellitus with diabetic neuropathy, unspecified: Secondary | ICD-10-CM | POA: Diagnosis not present

## 2020-03-08 DIAGNOSIS — A4159 Other Gram-negative sepsis: Secondary | ICD-10-CM | POA: Diagnosis not present

## 2020-03-08 DIAGNOSIS — N10 Acute tubulo-interstitial nephritis: Secondary | ICD-10-CM | POA: Diagnosis not present

## 2020-03-08 DIAGNOSIS — B961 Klebsiella pneumoniae [K. pneumoniae] as the cause of diseases classified elsewhere: Secondary | ICD-10-CM | POA: Diagnosis not present

## 2020-03-08 DIAGNOSIS — E785 Hyperlipidemia, unspecified: Secondary | ICD-10-CM | POA: Diagnosis not present

## 2020-03-08 DIAGNOSIS — F319 Bipolar disorder, unspecified: Secondary | ICD-10-CM | POA: Diagnosis not present

## 2020-03-08 DIAGNOSIS — I10 Essential (primary) hypertension: Secondary | ICD-10-CM | POA: Diagnosis not present

## 2020-03-08 LAB — CULTURE, BLOOD (SINGLE)
Culture: NO GROWTH
Special Requests: ADEQUATE

## 2020-03-08 MED ORDER — FREESTYLE LITE TEST VI STRP
ORAL_STRIP | 3 refills | Status: DC
Start: 2020-03-08 — End: 2020-03-21

## 2020-03-08 NOTE — Patient Instructions (Addendum)
Please continue: - Metformin 1000 mg with dinner - Lantus 36 units at bedtime - NovoLog:  10-12 units in am 10-12 units with lunch 10-12 units with dinner If your sugars are <100 and you eat a low carb meal, inject only ~6 units. - Novolog Sliding scale: 201-250: + 2 units 251-300: + 3 units 301-350: + 4 units >350: + 5 units  Please return in 1.5 months with your sugar log.

## 2020-03-08 NOTE — Progress Notes (Signed)
Patient ID: MEIGHAN TRETO, female   DOB: 1955-05-18, 65 y.o.   MRN: [id number]  This visit occurred during the SARS-CoV-2 public health emergency.  Safety protocols were in place, including screening questions prior to the visit, additional usage of staff PPE, and extensive cleaning of exam room while observing appropriate contact time as indicated for disinfecting solutions.   HPI: ANTINETTE KEOUGH is a 65 y.o.-year-old female, initially referred by her PCP, Dr. Sharilyn Sites (PA: Collene Mares), returning for f/u for LADA, dx 2005, insulin-dependent since ~2006, controlled, with complications (PN, gastroparesis?).  Last visit 3 months ago.  She is here with her husband who offers part of the history especially about her recent hospitalization, diet, insulin doses and blood sugars at home, as patient is still mildly confused.   Since last visit, she contacted me with high blood sugars and weight increase her insulin slightly.  However, she was admitted several days ago with encephalopathy in the setting of severe UTI and pyelonephritis and also had hypoglycemia (44) considered to be related to the infection and also to malnutrition in the days leading to the admission.  After she left the hospital, on antibiotics, sugars are slightly higher than before.  She continues to see neurology.  She has short-term memory loss and disequilibrium.  Reviewed history: She was on an Omnipod insulin pump on 08/01/2015. She had severe anxiety and depression and got so overwhelmed with the insulin pump that we had to take her off the pump.   We switched her to a basal-bolus insulin regimen, but she was unable to calculate the insulin doses based on insulin to carb ratio, so now she is using fixed rapid acting insulin doses.  Reviewed HbA1c levels: Lab Results  Component Value Date   HGBA1C 5.0 03/03/2020   HGBA1C 5.8 (A) 11/09/2019   HGBA1C 5.7 (A) 06/30/2019  03/04/2018:  Hba1c calculated from fructosamine is  the best in a long time, at 6.84%! 12/19/2017: HbA1c calculated from fructosamine has improved to 7.1%! 09/12/2017: HbA1c  Calculated from the fructosamine is 7.5%, improved from before. 05/07/2017: HbA1c calculated from the fructosamine was 7.7% 02/07/2017: HbA1c calculated from the fructosamine was higher, at 7.7%. 11/07/2016: HbA1c calc. from the fructosamine is a little lower, at 7.25% 08/07/2016: HbA1c calculated from the fructosamine is 7.5% 05/31/2016: HbA1c calculated from fructosamine is better, at 7.25% 12/15/2015: HbA1c calculated from the fructosamine is higher, 8.4%. 08/24/2015:  HbA1c calculated from fructosamine is 7.4%. 05/24/2015: HbA1c calculated from fructosamine is 6.9%.  In 01/2019, she had a professional CGM attached: [place] professional CGM traces between 12/31/2018 at 01/11/2019 were analyzed and the reports will be scanned.  Patient's sugars are variable but consistent patterns were noticed: Sugars are low in the second half of the night and morning, but they increase significantly after breakfast.  Around lunchtime she starts to drop her sugars and they increase but in the less significant fashion after dinner.  Patient was advised to: - reduce the dose of Lantus to 30 units for now.  - If the sugars after breakfast remain high after reducing the Lantus,  increase the dose of rapid acting insulin with breakfast by 2 units. - inject approximately 15 minutes before the meal to see if this helps reducing the sugars after breakfast. -  One of her concerns was about what to do if she is not hungry at lunch.  Reducing the Lantus will help her not drop even if she is not eating, however, if she does feel  like she is dropping she may need glucose tablets, 2-4 to keep her sugars up.  She is on: - Metformin 1000 mg with dinner - Lantus  34 >> 30 >> 32-34 >> 30-34 >> 36 units at bedtime - NovoLog:  10-12 >> 10 >> 8-10 >> 10-12 units in am 8-10 >> 10-12 units with  lunch 10-12 >> 8-10 >> 10-12 units with dinner - Novolog Sliding scale: 201-250: + 2 units 251-300: + 3 units 301-350: + 4 units >350: + 5 units  Pt checks her sugars  0-2 times a day per review of her log: - am:  49, 63-242 >> 44, 66-263 >> 112-237, 289 >> 104-200 - 2h after b'fast: 57, 157 >> 345 >> 43, 51 >> 58, 111 >> n/c - before lunch:  45-228, 232 >> 85-289, 296 >> n/c - 2h after lunch: n/c >> 94 >> n/c >> 43 >> n/c - before dinner: 47-208, 292 >> 51, 79-283, 340, 355 >> n/c - 2h after dinner: 37, 44-222, 260, 300 >> n/c >> 45 >> n/c - bedtime: 59-219, 310 >> 40, 78-263, 376 >> 210, 219 - at night: 165 >> n/c >> 32, 90 >> 51, 88, 134 >> n/c Lowest sugar was 35 >> 37 >> 32 >> 40; she has hypoglycemia awareness in the 50s Highest sugar was   270 >> 345 >> 355.  No CKD, last BUN/creatinine:  Lab Results  Component Value Date   BUN 6 (L) 03/06/2020   CREATININE 0.71 03/06/2020   No microalbuminuria: Lab Results  Component Value Date   MICRALBCREAT 5.4 11/07/2016    + Dyslipidemia; lipids: Lab Results  Component Value Date   CHOL 82 02/28/2019   HDL 22 (L) 02/28/2019   LDLCALC 39 02/28/2019   TRIG 105 02/28/2019   CHOLHDL 3.7 02/28/2019  Not on a statin.  -Last eye exam was on 06/03/2019: No DR reportedly -my Eye Dr: Dr. Earley Favor.   -+ Numbness and tingling in her feet.  Also, occasional pain.  She is on Neurontin. She sees a podiatrist [facility]).  She had 2 toenails removed in the past due to fungal infection.  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  She has a diagnosis of Parkinson's disease.  She also has bipolar disease. Also: GERD, HTN, fibromyalgia>> on Neurontin.  Latest TSH was normal: Lab Results  Component Value Date   TSH 0.533 03/03/2020   ROS: Constitutional: no weight gain/no weight loss, no fatigue, no subjective hyperthermia, no subjective hypothermia Eyes: no blurry vision, no xerophthalmia ENT: no sore throat, no  nodules palpated in neck, no dysphagia, no odynophagia, no hoarseness Cardiovascular: no CP/no SOB/no palpitations/no leg swelling Respiratory: no cough/no SOB/no wheezing Gastrointestinal: no N/no V/no D/no C/no acid reflux Musculoskeletal: no muscle aches/+ joint aches Skin: no rashes, no hair loss Neurological: no tremors/+ numbness/+ tingling/no dizziness, + disequilibrium  I reviewed pt's medications, allergies, PMH, social hx, family hx, and changes were documented in the history of present illness. Otherwise, unchanged from my initial visit note.  Past Medical History:  Diagnosis Date  . Anxiety   . Bipolar 1 disorder ([place])   . Breast density 06/25/2012   Right breast density, will get mammogram and [place]  . Depression   . Diabetes mellitus without complication ([place])    type 1  . Duodenitis   . GERD (gastroesophageal reflux disease)   . History of kidney stones   . HTN (hypertension)   . Hyperlipidemia   .  IBS (irritable bowel syndrome)   . Neuropathy   . Sepsis ([place]) 02/27/2019   Past Surgical History:  Procedure Laterality Date  . ABDOMINAL HYSTERECTOMY     partial  . CHOLECYSTECTOMY    . COLONOSCOPY    . CYSTOSCOPY WITH RETROGRADE PYELOGRAM, URETEROSCOPY AND STENT PLACEMENT Right 03/31/2019   Procedure: CYSTOSCOPY WITH RETROGRADE PYELOGRAM, URETEROSCOPY AND STENT PLACEMENT;  Surgeon: Robley Fries, MD;  Location: [facility];  Service: Urology;  Laterality: Right;  . CYSTOSCOPY WITH STENT PLACEMENT Right 03/02/2019   Procedure: CYSTOSCOPY WITH STENT PLACEMENT;  Surgeon: Robley Fries, MD;  Location: [facility];  Service: Urology;  Laterality: Right;  . HOLMIUM LASER APPLICATION Right 8/65/7846   Procedure: HOLMIUM LASER APPLICATION;  Surgeon: Robley Fries, MD;  Location: [facility];  Service: Urology;  Laterality: Right;  . UPPER GASTROINTESTINAL ENDOSCOPY     Social History   Socioeconomic History  . Marital status: Married     Spouse name: phillip  . Number of children: 1  . Years of education: 73  . Highest education level: High school graduate  Occupational History  . Occupation: Disabled  Tobacco Use  . Smoking status: Never Smoker  . Smokeless tobacco: Never Used  Vaping Use  . Vaping Use: Some days  . Substances: Flavoring  Substance and Sexual Activity  . Alcohol use: No    Alcohol/week: 0.0 standard drinks  . Drug use: No  . Sexual activity: Not Currently  Other Topics Concern  . Not on file  Social History Narrative   Lives at home w/ her husband   Left-handed   Caffeine: 1-2 cups of coffee daily   Social Determinants of Health   Financial Resource Strain: Not on file  Food Insecurity: Not on file  Transportation Needs: Not on file  Physical Activity: Not on file  Stress: Not on file  Social Connections: Not on file  Intimate Partner Violence: Not on file   Current Outpatient Medications on File Prior to Visit  Medication Sig Dispense Refill  . acetaminophen (TYLENOL) 500 MG tablet Take 1,000 mg by mouth every 6 (six) hours as needed for mild pain or headache.    . ALPRAZolam (XANAX) 1 MG tablet Take 1 mg by mouth 4 (four) times daily as needed for anxiety.     . AMBULATORY NON FORMULARY MEDICATION Squatty Potty x 1 1 each 0  . amLODipine (NORVASC) 5 MG tablet Take 5 mg by mouth daily.    . BD PEN NEEDLE NANO U/F 32G X 4 MM MISC Use with insulin pen 4 times a day 400 each 3  . BENICAR 40 MG tablet Take 40 mg by mouth daily.     . busPIRone (BUSPAR) 10 MG tablet Take 10 mg by mouth in the morning and at bedtime.    . cholecalciferol (VITAMIN D3) 25 MCG (1000 UNIT) tablet Take 1,000 Units by mouth daily.    . ciprofloxacin (CIPRO) 500 MG tablet Take 1 tablet (500 mg total) by mouth 2 (two) times daily for 11 days. 22 tablet 0  . Docusate Sodium (COLACE PO) Take 100 mg by mouth daily.    Marland Kitchen esomeprazole (NEXIUM) 40 MG capsule Take 40 mg by mouth 2 (two) times daily.    Marland Kitchen gabapentin  (NEURONTIN) 800 MG tablet Take 800 mg by mouth 3 (three) times daily.     Marland Kitchen glucagon 1 MG injection Inject 1 mg into the muscle once as needed for up to 1  dose. 1 each 12  . glucose blood (FREESTYLE LITE) test strip USE TO CHECK BLOOD SUGAR 6 TIMES PER DAY 600 each 2  . insulin aspart (NOVOLOG FLEXPEN) 100 UNIT/ML FlexPen Inject 10 -12 Units into the skin daily after breakfast AND 8 -10 Units daily before lunch AND 10-12 Units daily before supper.  201-250=2 units, 251-300=3 units, 301-350=4units, >350= 5 unitsMax daily 40 units. 45 mL 3  . insulin glargine (LANTUS SOLOSTAR) 100 UNIT/ML Solostar Pen Inject 32 Units into the skin daily. (Patient taking differently: Inject 36 Units into the skin at bedtime.) 30 mL 1  . Insulin Syringes, Disposable, U-100 1 ML MISC To use for insulin injection.. 100 each 1  . Lancets (FREESTYLE) lancets USE TO TEST BLOOD SUGAR 4 TO 6 TIMES DAILY AS INSTRUCTED 300 each 6  . losartan (COZAAR) 25 MG tablet Take 25 mg by mouth daily.    . metFORMIN (GLUCOPHAGE) 500 MG tablet TAKE 1 TABLET TWICE A DAY WITH MEALS (Patient taking differently: Take 500 mg by mouth 2 (two) times daily with a meal.) 180 tablet 3  . nortriptyline (PAMELOR) 50 MG capsule Take 50 mg by mouth at bedtime.     . ondansetron (ZOFRAN-ODT) 4 MG disintegrating tablet PLACE 1 TO 2 TABLETS ON TONGUE EVERY 4 TO 6 HOURS AS NEEDED FOR NAUSEA (Patient taking differently: Take 4-8 mg by mouth every 4 (four) hours as needed for nausea or vomiting.) 180 tablet 4  . OSCIMIN 0.125 MG SUBL DISSOLVE 1 TABLET UNDER THE TONGUE EVERY 4 HOURS AS NEEDED (NEED APPOINTMENT) (Patient not taking: Reported on 03/04/2020) 360 tablet 0  . Probiotic CAPS Take 1 capsule by mouth daily. 30 capsule 1  . Propylene Glycol (SYSTANE BALANCE OP) Place 1 drop into both eyes daily as needed (dry eyes).    . traZODone (DESYREL) 100 MG tablet 1  qhs  May increse to 2  qhs  After 1 week (Patient taking differently: Take 100 mg by mouth at bedtime.  1  qhs  May increse to 2  qhs  After 1 week) 180 tablet 1  . venlafaxine (EFFEXOR) 37.5 MG tablet Take 37.5 mg by mouth daily.    . vitamin B-12 (CYANOCOBALAMIN) 1000 MCG tablet Take 1,000 mcg by mouth daily.     No current facility-administered medications on file prior to visit.   Allergies  Allergen Reactions  . Abilify [Aripiprazole]     Patient is intolerant  . Phenergan [Promethazine] Diarrhea and Nausea And Vomiting  . Reglan [Metoclopramide] Other (See Comments)    Chest pains   Family History  Problem Relation Age of Onset  . Hypertension Mother   . Bipolar disorder Mother   . Cancer Mother   . Heart disease Brother   . Thyroid disease Sister   . Hypertension Sister   . Bipolar disorder Sister   . Depression Father   . Cancer Father   . Bipolar disorder Daughter   . Bipolar disorder Maternal Aunt   . Colon cancer Neg Hx   . Colon polyps Neg Hx   . Esophageal cancer Neg Hx   . Kidney disease Neg Hx   . Gallbladder disease Neg Hx   . Diabetes Neg Hx   . Dementia Neg Hx     PE: BP 120/82 (BP Location: Right Arm, Patient Position: Sitting, Cuff Size: Normal)   Pulse 98   Ht _0  (1.626 m)   Wt 169 lb 9.6 oz (76.9 kg)   SpO2 97%  BMI 29.11 kg/m    Wt Readings from Last 3 Encounters:  03/08/20 169 lb 9.6 oz (76.9 kg)  03/03/20 170 lb (77.1 kg)  11/09/19 176 lb 3.2 oz (79.9 kg)   Constitutional: slightly overweight, in NAD Eyes: PERRLA, EOMI, no exophthalmos ENT: moist mucous membranes, no thyromegaly, no cervical lymphadenopathy Cardiovascular: RRR, No MRG Respiratory: CTA B Gastrointestinal: abdomen soft, NT, ND, BS+ Musculoskeletal: no deformities, strength intact in all 4 Skin: moist, warm, + many tatoos Neurological: no tremor with outstretched hands, DTR normal in all 4  ASSESSMENT: 1. 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  Component      Latest Ref Rng 07/16/2013  C-Peptide     0.80 - 3.90 ng/mL 1.29  Glucose     70 - 99 mg/dL 183 (H)  Glutamic Acid Decarb Ab     <=1.0 U/mL 11.3 (H)  Pancreatic Islet Cell Antibody     <5 JDF Units 5 (A)  + anti-pancreatic antibodies >> LADA rather than type 2 DM. She still has a positive C peptide >> still has insulin secretion.  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 HL  PLAN:  1.  This is a complex patient with very uncontrolled LADA, due to depression/anxiety/GI problems.  Her sugars usually fluctuate between 30 and 300. In the past, we had her on insulin pump and she did not do well with this due to anxiety.  Also, we tried adjustable NovoLog doses before meals, but she prefers a fixed regimen.  She had a professional CGM placed before and we adjusted her insulin doses based on the patterns.  At this visit, I confirmed with her that she would not want to restart the CGM. -She has a glucagon kit at home -She contacted me ~3 weeks ago for high blood sugars and increase her insulin doses slightly.  However, she was admitted to the hospital on 03/03/2020 with acute delirium, hallucinations, metabolic encephalopathy in the setting UTI complicated with pyelonephritis.  Only 1 blood culture was positive, considered a contaminant.  Her blood sugars were low, in the 40s on admission.  She was discharged on antibiotics and sugars are now higher, but she did not take it consistently and she threw away her blood sugar log before the admission.  Therefore, I do not have many blood sugars too low by but the sugars that I reviewed were mostly above target.  We did discuss that this may continue until the infection resolves and due to previous hypoglycemia, I advised him to keep a close eye on her blood sugars on the current regimen while treating the highs using the sliding scale.  I recapitulated how she needs to bolus for meals and to use the sliding scale and husband voiced understanding.   He had several questions about how to proceed when her sugars are low or high and how much the goal is for certain meals.  I answered these to the best of my ability. -We will not change her regimen for now but I did advise him to back up to previous Lantus (30 to 34 units) and NovoLog doses (8 to 10 units) when she stops antibiotics and sugars start to improve. - I suggested to:  Patient Instructions  Please continue: - Metformin 1000 mg with dinner - Lantus 36 units at bedtime - NovoLog:  10-12 units in am 10-12 units with lunch 10-12 units with dinner If  your sugars are <100 and you eat a low carb meal, inject only ~6 units. - Novolog Sliding scale: 201-250: + 2 units 251-300: + 3 units 301-350: + 4 units >350: + 5 units  Please return in 1.5 months with your sugar log.   - advised to check sugars at different times of the day - 4x a day, rotating check times - advised for yearly eye exams >> she is UTD - return to clinic in 3 months  2. PN  -Related to diabetes -Continues on Neurontin without side effects -I did recommend alpha-lipoic acid 600 mg twice a day in the past but she did not start it -This summer I referred her to physical therapy at [facility] office, upon her request, since she had this before with good results.  At last visit I gave her again the telephone numbers at last visit but she still did not contact.  She was planning to contact them at last visit, but did not do so.  3. HL -Reviewed latest lipid panel from a year ago: Fractions at goal with exception of a low HDL: Lab Results  Component Value Date   CHOL 82 02/28/2019   HDL 22 (L) 02/28/2019   LDLCALC 39 02/28/2019   TRIG 105 02/28/2019   CHOLHDL 3.7 02/28/2019  -She is not on a statin -She is due for another lipid panel -we will check at next visit  - Total time spent for the visit: 40 minutes, in obtaining medical information from the chart especially reviewing previous messages and  previous admission records:  previous labs, imaging evaluations, and treatments; discussing with patient and her husband - reviewing her symptoms, counseling her about her condition (please see the discussed topics above), and developing a plan to further investigate and treat them; she and her husband had a number of questions which I addressed.   Philemon Kingdom, MD PhD [facility]

## 2020-03-09 ENCOUNTER — Telehealth: Payer: Self-pay | Admitting: Internal Medicine

## 2020-03-09 DIAGNOSIS — E139 Other specified diabetes mellitus without complications: Secondary | ICD-10-CM

## 2020-03-09 NOTE — Telephone Encounter (Signed)
Pt called to advise that she is switching the most recent medication refill from the [facility] pharmacy to [facility].Marland KitchenFYI

## 2020-03-09 NOTE — Telephone Encounter (Signed)
Noted  

## 2020-03-10 NOTE — Telephone Encounter (Signed)
Pt's husband called regarding the sliding scale information. He says he spoke with Dr. Elvera Lennox and they have everything all worked out... FYI

## 2020-03-14 DIAGNOSIS — I10 Essential (primary) hypertension: Secondary | ICD-10-CM | POA: Diagnosis not present

## 2020-03-14 DIAGNOSIS — Z6828 Body mass index (BMI) 28.0-28.9, adult: Secondary | ICD-10-CM | POA: Diagnosis not present

## 2020-03-14 DIAGNOSIS — F329 Major depressive disorder, single episode, unspecified: Secondary | ICD-10-CM | POA: Diagnosis not present

## 2020-03-14 DIAGNOSIS — Z1331 Encounter for screening for depression: Secondary | ICD-10-CM | POA: Diagnosis not present

## 2020-03-14 DIAGNOSIS — K219 Gastro-esophageal reflux disease without esophagitis: Secondary | ICD-10-CM | POA: Diagnosis not present

## 2020-03-14 DIAGNOSIS — G92 Immune effector cell-associated neurotoxicity syndrome, grade unspecified: Secondary | ICD-10-CM | POA: Diagnosis not present

## 2020-03-15 DIAGNOSIS — B961 Klebsiella pneumoniae [K. pneumoniae] as the cause of diseases classified elsewhere: Secondary | ICD-10-CM | POA: Diagnosis not present

## 2020-03-15 DIAGNOSIS — A4159 Other Gram-negative sepsis: Secondary | ICD-10-CM | POA: Diagnosis not present

## 2020-03-15 DIAGNOSIS — I10 Essential (primary) hypertension: Secondary | ICD-10-CM | POA: Diagnosis not present

## 2020-03-15 DIAGNOSIS — N10 Acute tubulo-interstitial nephritis: Secondary | ICD-10-CM | POA: Diagnosis not present

## 2020-03-15 DIAGNOSIS — F319 Bipolar disorder, unspecified: Secondary | ICD-10-CM | POA: Diagnosis not present

## 2020-03-15 DIAGNOSIS — E1040 Type 1 diabetes mellitus with diabetic neuropathy, unspecified: Secondary | ICD-10-CM | POA: Diagnosis not present

## 2020-03-17 DIAGNOSIS — E1040 Type 1 diabetes mellitus with diabetic neuropathy, unspecified: Secondary | ICD-10-CM | POA: Diagnosis not present

## 2020-03-17 DIAGNOSIS — N10 Acute tubulo-interstitial nephritis: Secondary | ICD-10-CM | POA: Diagnosis not present

## 2020-03-17 DIAGNOSIS — I10 Essential (primary) hypertension: Secondary | ICD-10-CM | POA: Diagnosis not present

## 2020-03-17 DIAGNOSIS — B961 Klebsiella pneumoniae [K. pneumoniae] as the cause of diseases classified elsewhere: Secondary | ICD-10-CM | POA: Diagnosis not present

## 2020-03-17 DIAGNOSIS — A4159 Other Gram-negative sepsis: Secondary | ICD-10-CM | POA: Diagnosis not present

## 2020-03-17 DIAGNOSIS — F319 Bipolar disorder, unspecified: Secondary | ICD-10-CM | POA: Diagnosis not present

## 2020-03-18 DIAGNOSIS — N10 Acute tubulo-interstitial nephritis: Secondary | ICD-10-CM | POA: Diagnosis not present

## 2020-03-18 DIAGNOSIS — N200 Calculus of kidney: Secondary | ICD-10-CM | POA: Diagnosis not present

## 2020-03-21 MED ORDER — FREESTYLE LITE TEST VI STRP: ORAL_STRIP | 3 refills | Status: DC

## 2020-03-21 NOTE — Telephone Encounter (Signed)
Rx sent to correct pharmacy.

## 2020-03-21 NOTE — Addendum Note (Signed)
Addended by: Kenyon Ana on: 03/21/2020 12:30 PM   Modules accepted: Orders

## 2020-03-21 NOTE — Telephone Encounter (Signed)
Patient's husband called due to the following RX was sent to [facility] instead of [facility] as requested. Request the following RX be sent asap to [facility]:  MEDICATION:  glucose blood (FREESTYLE LITE) test strip  PHARMACY:   [facility] (New PHARM)  HAS THE PATIENT CONTACTED THEIR PHARMACY?  Yes-sent to wrong PHARM  IS THIS A 90 DAY SUPPLY : No  IS PATIENT OUT OF MEDICATION: No  IF NOT; HOW MUCH IS LEFT: Approx. 3 weeks  LAST APPOINTMENT DATE: @3 /08/2020  NEXT APPOINTMENT DATE:@4 /29/2022  DO WE HAVE YOUR PERMISSION TO LEAVE A DETAILED MESSAGE?: Yes  OTHER COMMENTS:    **Let patient know to contact pharmacy at the end of the day to make sure medication is ready. **  ** Please notify patient to allow 48-72 hours to process**  **Encourage patient to contact the pharmacy for refills or they can request refills through [facility]**

## 2020-03-22 DIAGNOSIS — A4159 Other Gram-negative sepsis: Secondary | ICD-10-CM | POA: Diagnosis not present

## 2020-03-22 DIAGNOSIS — B961 Klebsiella pneumoniae [K. pneumoniae] as the cause of diseases classified elsewhere: Secondary | ICD-10-CM | POA: Diagnosis not present

## 2020-03-22 DIAGNOSIS — E1040 Type 1 diabetes mellitus with diabetic neuropathy, unspecified: Secondary | ICD-10-CM | POA: Diagnosis not present

## 2020-03-22 DIAGNOSIS — N10 Acute tubulo-interstitial nephritis: Secondary | ICD-10-CM | POA: Diagnosis not present

## 2020-03-22 DIAGNOSIS — F319 Bipolar disorder, unspecified: Secondary | ICD-10-CM | POA: Diagnosis not present

## 2020-03-22 DIAGNOSIS — I10 Essential (primary) hypertension: Secondary | ICD-10-CM | POA: Diagnosis not present

## 2020-04-04 DIAGNOSIS — A4159 Other Gram-negative sepsis: Secondary | ICD-10-CM | POA: Diagnosis not present

## 2020-04-04 DIAGNOSIS — E1040 Type 1 diabetes mellitus with diabetic neuropathy, unspecified: Secondary | ICD-10-CM | POA: Diagnosis not present

## 2020-04-04 DIAGNOSIS — N10 Acute tubulo-interstitial nephritis: Secondary | ICD-10-CM | POA: Diagnosis not present

## 2020-04-04 DIAGNOSIS — B961 Klebsiella pneumoniae [K. pneumoniae] as the cause of diseases classified elsewhere: Secondary | ICD-10-CM | POA: Diagnosis not present

## 2020-04-25 DIAGNOSIS — F411 Generalized anxiety disorder: Secondary | ICD-10-CM | POA: Diagnosis not present

## 2020-04-25 DIAGNOSIS — F3132 Bipolar disorder, current episode depressed, moderate: Secondary | ICD-10-CM | POA: Diagnosis not present

## 2020-04-29 ENCOUNTER — Ambulatory Visit (INDEPENDENT_AMBULATORY_CARE_PROVIDER_SITE_OTHER): Payer: Medicare Other | Admitting: Internal Medicine

## 2020-04-29 ENCOUNTER — Other Ambulatory Visit: Payer: Self-pay

## 2020-04-29 ENCOUNTER — Encounter: Payer: Self-pay | Admitting: Internal Medicine

## 2020-04-29 VITALS — BP 140/78 | HR 110 | Ht 64.0 in | Wt 170.6 lb

## 2020-04-29 DIAGNOSIS — E139 Other specified diabetes mellitus without complications: Secondary | ICD-10-CM | POA: Diagnosis not present

## 2020-04-29 DIAGNOSIS — G63 Polyneuropathy in diseases classified elsewhere: Secondary | ICD-10-CM | POA: Diagnosis not present

## 2020-04-29 DIAGNOSIS — E785 Hyperlipidemia, unspecified: Secondary | ICD-10-CM

## 2020-04-29 NOTE — Progress Notes (Addendum)
Patient ID: Sabrina ComptonBarbara L [name], female   DOB: 12/13/1955, 65 y.o.   MRN: [id number]  This visit occurred during the SARS-CoV-2 public health emergency.  Safety protocols were in place, including screening questions prior to the visit, additional usage of staff PPE, and extensive cleaning of exam room while observing appropriate contact time as indicated for disinfecting solutions.   HPI: [name] is a 65 y.o.-year-old female, initially referred by her PCP, Dr. Assunta FoundJohn Golding (PA: Lenise HeraldBenjamin Mann), returning for f/u for LADA, dx 2005, insulin-dependent since ~2006, controlled, with complications (PN, gastroparesis?).  Last visit 1.5 months ago.  Interim history: Before last visit, she was admitted  with encephalopathy in the setting of severe UTI and pyelonephritis and also had hypoglycemia (44) considered to be related to the infection and also to malnutrition in the days leading to the admission. After she left the hospital, on antibiotics, sugars increased.  At last visit, she was on higher doses of insulin, which we continued. She continues to see neurology.  She is bipolar and also has short-term memory loss and disequilibrium.  Reviewed history: She was on an Omnipod insulin pump on 08/01/2015. She had severe anxiety and depression and got so overwhelmed with the insulin pump that we had to take her off the pump.   We switched her to a basal-bolus insulin regimen, but she was unable to calculate the insulin doses based on insulin to carb ratio, so now she is using fixed rapid acting insulin doses.  Reviewed HbA1c levels: Lab Results  Component Value Date   HGBA1C 5.0 03/03/2020   HGBA1C 5.8 (A) 11/09/2019   HGBA1C 5.7 (A) 06/30/2019  03/04/2018:  Hba1c calculated from fructosamine is the best in a long time, at 6.84%! 12/19/2017: HbA1c calculated from fructosamine has improved to 7.1%! 09/12/2017: HbA1c  Calculated from the fructosamine is 7.5%, improved from before. 05/07/2017: HbA1c  calculated from the fructosamine was 7.7% 02/07/2017: HbA1c calculated from the fructosamine was higher, at 7.7%. 11/07/2016: HbA1c calc. from the fructosamine is a little lower, at 7.25% 08/07/2016: HbA1c calculated from the fructosamine is 7.5% 05/31/2016: HbA1c calculated from fructosamine is better, at 7.25% 12/15/2015: HbA1c calculated from the fructosamine is higher, 8.4%. 08/24/2015:  HbA1c calculated from fructosamine is 7.4%. 05/24/2015: HbA1c calculated from fructosamine is 6.9%.  In 01/2019, she had a professional CGM attached: [employer] professional CGM traces between 12/31/2018 at 01/11/2019 were analyzed and the reports will be scanned.  Patient's sugars are variable but consistent patterns were noticed: Sugars are low in the second half of the night and morning, but they increase significantly after breakfast.  Around lunchtime she starts to drop her sugars and they increase but in the less significant fashion after dinner.  Patient was advised to: - reduce the dose of Lantus to 30 units for now.  - If the sugars after breakfast remain high after reducing the Lantus,  increase the dose of rapid acting insulin with breakfast by 2 units. - inject approximately 15 minutes before the meal to see if this helps reducing the sugars after breakfast. -  One of her concerns was about what to do if she is not hungry at lunch.  Reducing the Lantus will help her not drop even if she is not eating, however, if she does feel like she is dropping she may need glucose tablets, 2-4 to keep her sugars up.  She is on: - Metformin 1000 mg with dinner - Lantus  34 >> 30 >> 32-34 >> 30-34 >>  36 units at bedtime - NovoLog:  10-12 >> 10 >> 8-10 >> 10-12 units in am 8-10 >> 10-12 units with lunch 10-12 >> 8-10 >> 10-12 units with dinner - Novolog Sliding scale: 201-250: + 2 units 251-300: + 3 units 301-350: + 4 units >350: + 5 units  Pt checks her sugars  0-2 times a day per review of her  log: - am:   44, 66-263 >> 112-237, 289 >> 104-200 >> 85, 115-247, 325 - 2h after b'fast: 345 >> 43, 51 >> 58, 111 >> n/c >> 149, 229 - before lunch:  45-228, 232 >> 85-289, 296 >> n/c >> 43, 46-228, 295, 351 - 2h after lunch: n/c >> 94 >> n/c >> 43 >> n/c >> 45-178, 209 - before dinner: 47-208, 292 >> 51, 79-283, 340, 355 >> n/c >> 40-298 - 2h after dinner: 37, 44-222, 260, 300 >> n/c >> 45 >> n/c >> 77-210 - bedtime: 59-219, 310 >> 40, 78-263, 376 >> 210, 219 >> 71-239, 339 - at night: 165 >> n/c >> 32, 90 >> 51, 88, 134 >> n/c >> 52, 65 Lowest sugar was 35 >> 37 >> 32 >> 40 >> 40; she has hypoglycemia awareness in the 50s Highest sugar was   270 >> 345 >> 355 >> 351.  No CKD, last BUN/creatinine:  Lab Results  Component Value Date   BUN 6 (L) 03/06/2020   CREATININE 0.71 03/06/2020   No microalbuminuria: Lab Results  Component Value Date   MICRALBCREAT 5.4 11/07/2016    + Dyslipidemia; lipids were checked 6 mo ago by PCP - no records: Lab Results  Component Value Date   CHOL 82 02/28/2019   HDL 22 (L) 02/28/2019   LDLCALC 39 02/28/2019   TRIG 105 02/28/2019   CHOLHDL 3.7 02/28/2019  Not on a statin.  -Last eye exam was on 06/03/2019: No DR reportedly -[facility]: Dr. Janne Lab.   -+ Numbness and tingling in her feet.  Also, occasional pain.  She is on Neurontin. She sees a podiatrist [facility]).  She had 2 toenails removed in the past due to fungal infection.  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  She has a diagnosis of Parkinson's disease.  She also has bipolar disease. Also: GERD, HTN, fibromyalgia>> on Neurontin.  Latest TSH was normal: Lab Results  Component Value Date   TSH 0.533 03/03/2020   ROS: Constitutional: no weight gain/no weight loss, no fatigue, no subjective hyperthermia, no subjective hypothermia Eyes: no blurry vision, no xerophthalmia ENT: no sore throat, no nodules palpated in neck, no dysphagia, no odynophagia, no  hoarseness Cardiovascular: no CP/no SOB/no palpitations/no leg swelling Respiratory: no cough/no SOB/no wheezing Gastrointestinal: no N/no V/no D/no C/no acid reflux, + AP Musculoskeletal: no muscle aches/+ joint aches Skin: no rashes, no hair loss Neurological: no tremors/+ numbness/+ tingling/no dizziness, + disequilibrium  I reviewed pt's medications, allergies, PMH, social hx, family hx, and changes were documented in the history of present illness. Otherwise, unchanged from my initial visit note.  Past Medical History:  Diagnosis Date  . Anxiety   . Bipolar 1 disorder (HCC)   . Breast density 06/25/2012   Right breast density, will get mammogram and [place]  . Depression   . Diabetes mellitus without complication (HCC)    type 1  . Duodenitis   . GERD (gastroesophageal reflux disease)   . History of kidney stones   . HTN (hypertension)   . Hyperlipidemia   .  IBS (irritable bowel syndrome)   . Neuropathy   . Sepsis (HCC) 02/27/2019   Past Surgical History:  Procedure Laterality Date  . ABDOMINAL HYSTERECTOMY     partial  . CHOLECYSTECTOMY    . COLONOSCOPY    . CYSTOSCOPY WITH RETROGRADE PYELOGRAM, URETEROSCOPY AND STENT PLACEMENT Right 03/31/2019   Procedure: CYSTOSCOPY WITH RETROGRADE PYELOGRAM, URETEROSCOPY AND STENT PLACEMENT;  Surgeon: Noel Christmas, MD;  Location: [facility];  Service: Urology;  Laterality: Right;  . CYSTOSCOPY WITH STENT PLACEMENT Right 03/02/2019   Procedure: CYSTOSCOPY WITH STENT PLACEMENT;  Surgeon: Noel Christmas, MD;  Location: [facility];  Service: Urology;  Laterality: Right;  . HOLMIUM LASER APPLICATION Right 03/31/2019   Procedure: HOLMIUM LASER APPLICATION;  Surgeon: Noel Christmas, MD;  Location: [facility];  Service: Urology;  Laterality: Right;  . UPPER GASTROINTESTINAL ENDOSCOPY     Social History   Socioeconomic History  . Marital status: Married    Spouse name: phillip  . Number of children: 1  .  Years of education: 18  . Highest education level: High school graduate  Occupational History  . Occupation: Disabled  Tobacco Use  . Smoking status: Never Smoker  . Smokeless tobacco: Never Used  Vaping Use  . Vaping Use: Some days  . Substances: Flavoring  Substance and Sexual Activity  . Alcohol use: No    Alcohol/week: 0.0 standard drinks  . Drug use: No  . Sexual activity: Not Currently  Other Topics Concern  . Not on file  Social History Narrative   Lives at home w/ her husband   Left-handed   Caffeine: 1-2 cups of coffee daily   Social Determinants of Health   Financial Resource Strain: Not on file  Food Insecurity: Not on file  Transportation Needs: Not on file  Physical Activity: Not on file  Stress: Not on file  Social Connections: Not on file  Intimate Partner Violence: Not on file   Current Outpatient Medications on File Prior to Visit  Medication Sig Dispense Refill  . acetaminophen (TYLENOL) 500 MG tablet Take 1,000 mg by mouth every 6 (six) hours as needed for mild pain or headache.    . ALPRAZolam (XANAX) 1 MG tablet Take 1 mg by mouth 4 (four) times daily as needed for anxiety.     . AMBULATORY NON FORMULARY MEDICATION Squatty Potty x 1 1 each 0  . amLODipine (NORVASC) 5 MG tablet Take 5 mg by mouth daily.    . BD PEN NEEDLE NANO U/F 32G X 4 MM MISC Use with insulin pen 4 times a day 400 each 3  . BENICAR 40 MG tablet Take 40 mg by mouth daily.     . busPIRone (BUSPAR) 10 MG tablet Take 10 mg by mouth in the morning and at bedtime.    . cholecalciferol (VITAMIN D3) 25 MCG (1000 UNIT) tablet Take 1,000 Units by mouth daily.    Tery Sanfilippo Sodium (COLACE PO) Take 100 mg by mouth daily.    Marland Kitchen esomeprazole (NEXIUM) 40 MG capsule Take 40 mg by mouth 2 (two) times daily.    Marland Kitchen gabapentin (NEURONTIN) 800 MG tablet Take 800 mg by mouth 3 (three) times daily.     Marland Kitchen glucagon 1 MG injection Inject 1 mg into the muscle once as needed for up to 1 dose. 1 each 12  .  glucose blood (FREESTYLE LITE) test strip USE TO CHECK BLOOD SUGAR 6 TIMES PER DAY 600 each 3  .  insulin aspart (NOVOLOG FLEXPEN) 100 UNIT/ML FlexPen Inject 10 -12 Units into the skin daily after breakfast AND 8 -10 Units daily before lunch AND 10-12 Units daily before supper.  201-250=2 units, 251-300=3 units, 301-350=4units, >350= 5 unitsMax daily 40 units. 45 mL 3  . insulin glargine (LANTUS SOLOSTAR) 100 UNIT/ML Solostar Pen Inject 32 Units into the skin daily. (Patient taking differently: Inject 36 Units into the skin at bedtime. Inject 36 units into the skin) 30 mL 1  . Insulin Syringes, Disposable, U-100 1 ML MISC To use for insulin injection.. 100 each 1  . Lancets (FREESTYLE) lancets USE TO TEST BLOOD SUGAR 4 TO 6 TIMES DAILY AS INSTRUCTED 300 each 6  . losartan (COZAAR) 25 MG tablet Take 25 mg by mouth daily.    . metFORMIN (GLUCOPHAGE) 500 MG tablet TAKE 1 TABLET TWICE A DAY WITH MEALS (Patient taking differently: Take 500 mg by mouth 2 (two) times daily with a meal.) 180 tablet 3  . nortriptyline (PAMELOR) 50 MG capsule Take 50 mg by mouth at bedtime.     . ondansetron (ZOFRAN-ODT) 4 MG disintegrating tablet PLACE 1 TO 2 TABLETS ON TONGUE EVERY 4 TO 6 HOURS AS NEEDED FOR NAUSEA (Patient taking differently: Take 4-8 mg by mouth every 4 (four) hours as needed for nausea or vomiting.) 180 tablet 4  . OSCIMIN 0.125 MG SUBL DISSOLVE 1 TABLET UNDER THE TONGUE EVERY 4 HOURS AS NEEDED (NEED APPOINTMENT) 360 tablet 0  . Probiotic CAPS Take 1 capsule by mouth daily. 30 capsule 1  . Propylene Glycol (SYSTANE BALANCE OP) Place 1 drop into both eyes daily as needed (dry eyes).    . traZODone (DESYREL) 100 MG tablet 1  qhs  May increse to 2  qhs  After 1 week (Patient taking differently: Take 100 mg by mouth at bedtime. 1  qhs  May increse to 2  qhs  After 1 week) 180 tablet 1  . venlafaxine (EFFEXOR) 37.5 MG tablet Take 37.5 mg by mouth daily.    . vitamin B-12 (CYANOCOBALAMIN) 1000 MCG tablet Take  1,000 mcg by mouth daily.     No current facility-administered medications on file prior to visit.   Allergies  Allergen Reactions  . Abilify [Aripiprazole]     Patient is intolerant  . Phenergan [Promethazine] Diarrhea and Nausea And Vomiting  . Reglan [Metoclopramide] Other (See Comments)    Chest pains   Family History  Problem Relation Age of Onset  . Hypertension Mother   . Bipolar disorder Mother   . Cancer Mother   . Heart disease Brother   . Thyroid disease Sister   . Hypertension Sister   . Bipolar disorder Sister   . Depression Father   . Cancer Father   . Bipolar disorder Daughter   . Bipolar disorder Maternal Aunt   . Colon cancer Neg Hx   . Colon polyps Neg Hx   . Esophageal cancer Neg Hx   . Kidney disease Neg Hx   . Gallbladder disease Neg Hx   . Diabetes Neg Hx   . Dementia Neg Hx     PE: BP 140/78 (BP Location: Right Arm, Patient Position: Sitting, Cuff Size: Normal)   Pulse (!) 110   Ht 5\' 4"  (1.626 m)   Wt 170 lb 9.6 oz (77.4 kg)   SpO2 95%   BMI 29.28 kg/m    Wt Readings from Last 3 Encounters:  04/29/20 170 lb 9.6 oz (77.4 kg)  03/08/20 169 lb 9.6  oz (76.9 kg)  03/03/20 170 lb (77.1 kg)   Constitutional: slightly overweight, in NAD Eyes: PERRLA, EOMI, no exophthalmos ENT: moist mucous membranes, no thyromegaly, no cervical lymphadenopathy Cardiovascular: RRR, No MRG Respiratory: CTA B Gastrointestinal: abdomen soft, NT, ND, BS+ Musculoskeletal: no deformities, strength intact in all 4 Skin: moist, warm, + many tatoos Neurological: no tremor with outstretched hands, DTR normal in all 4  ASSESSMENT: 1. 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  Component     Latest Ref Rng 07/16/2013  C-Peptide     0.80 - 3.90 ng/mL 1.29  Glucose     70 - 99 mg/dL 161 (H)  Glutamic Acid Decarb Ab     <=1.0 U/mL 11.3 (H)  Pancreatic Islet Cell  Antibody     <5 JDF Units 5 (A)  + anti-pancreatic antibodies >> LADA rather than type 2 DM. She still has a positive C peptide >> still has insulin secretion.  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 HL  PLAN:  1.  Complex patient with uncontrolled LADA, difficult to manage due to depression/anxiety/GI problems.  Her sugars usually fluctuate between 30 and 300.  In the past, she was on an insulin pump but she did not do well with this due to anxiety.  She also had a professional CGM placed before and we adjusted her insulin doses based on the patterns.  However, she did not want to start on the personal CGM afterwards. -Before last visit, she contacted me with high blood sugars with and we increased her insulin doses slightly.  However, she was admitted to the hospital on 03/03/2020 with acute delirium, hallucinations, metabolic encephalopathy in the setting of UTI complicated by pyelonephritis.  Blood sugars were low, in the 40s, on admission.  She was discharged on antibiotics and sugars were higher when I last saw her and she was on slightly higher dosage of insulin.  I did advise her to continue the same doses at that time but decrease the dose back to the previous Lantus dose (30 to 34 units) and NovoLog dose (8 to 10 units) when she stopped antibiotics and sugars starting to improve.  Latest HbA1c was 5.0%, in 03/2020, but her directly measured HbA1c levels are not accurate for her, we are usually following her with fructosamine levels. -At this visit, per review of her detailed blood sugar log, sugars are more variable than before, with occasional low blood sugars, especially after meals and higher blood sugars before meals.  Therefore, in an effort to eliminate her low blood sugars, we will decrease the insulin doses before meals.  I also discussed with her about splitting the Lantus dose, especially since she is getting some low blood sugars overnight.  We will use a higher dose  at that time and a lower dose in the morning.  We will continue her sliding scale for now. - I suggested to:  Patient Instructions  Please continue: - Metformin 1000 mg with dinner  Please change: - NovoLog:  8-10 units in am 8-10 units with lunch 8-10 units with dinner  If your sugars are <100 and you eat a low carb meal, inject only ~6 units. - Novolog Sliding scale: 201-250: + 2 units 251-300: + 3 units 301-350: + 4 units >350: + 5 units  Please try to split: - Lantus 15 units in am and 20 units at bedtime  Try alpha lipoic acid  600 mg 2x a day.  Please return in 3 months with your sugar log.   - advised to check sugars at different times of the day - 4x a day, rotating check times - advised for yearly eye exams >> she is UTD - return to clinic in 3 months  2. PN  -Due to diabetes -She continues on Neurontin without side effects -I did recommend alpha-lipoic acid 600 mg twice a day in the past but she did not start it.  At this visit, I suggested this again and wrote it down for her. -In summer 2021, I referred her to physical therapy at [facility] office, upon her request, since she had this before with good results.  At last visit I gave her again the telephone numbers at last visit but she still did not contact.  She was planning to contact them at last visit, but she did not do so.  3. HL -Reviewed latest lipid panel from 02/2019: Fractions at goal with exception of a low HDL: Lab Results  Component Value Date   CHOL 82 02/28/2019   HDL 22 (L) 02/28/2019   LDLCALC 39 02/28/2019   TRIG 105 02/28/2019   CHOLHDL 3.7 02/28/2019  -She is not on a statin -She tells me she had another lipid panel by PCP approximately 6 months ago.  She will contact Dr. Sharyon [facility]us office to have this sent to me.  Office Visit on 04/29/2020  Component Date Value Ref Range Status  . Fructosamine 04/29/2020 325* 205 - 285 umol/L Final  HbA1c calculated from fructosamine has  improved to 7.1%.  Carlus Pavlov, MD PhD [facility] Endocrinology

## 2020-04-29 NOTE — Patient Instructions (Addendum)
Please continue: - Metformin 1000 mg with dinner  Please change: - NovoLog:  8-10 units in am 8-10 units with lunch 8-10 units with dinner  If your sugars are <100 and you eat a low carb meal, inject only ~6 units. - Novolog Sliding scale: 201-250: + 2 units 251-300: + 3 units 301-350: + 4 units >350: + 5 units  Please try to split: - Lantus 15 units in am and 20 units at bedtime  Try alpha lipoic acid 600 mg 2x a day.  Please return in 3 months with your sugar log.

## 2020-04-30 ENCOUNTER — Other Ambulatory Visit: Payer: Self-pay | Admitting: Internal Medicine

## 2020-05-03 ENCOUNTER — Telehealth: Payer: Self-pay | Admitting: Internal Medicine

## 2020-05-03 LAB — FRUCTOSAMINE: Fructosamine: 325 umol/L — ABNORMAL HIGH (ref 205–285)

## 2020-05-03 NOTE — Telephone Encounter (Signed)
Pt blood sugars have been running high in the mornings . Pt states that she does better on the old dose that Provider had her on.  Patient would like a call back as soon as possible.

## 2020-05-03 NOTE — Telephone Encounter (Signed)
Spoken to patient.  On Sunday May 1  202 at breakfast  140 at lunch  123 at dinner  On Monday May 2  273 at breakfast  160 at lunch  260 at dinner  On Tuesday May 3  200 at breakfast

## 2020-05-03 NOTE — Telephone Encounter (Signed)
Called and left a message for pt to call back and let us know how much insulin is she taking in the morning before meals and in the evening.

## 2020-05-03 NOTE — Telephone Encounter (Signed)
T, please advise her to increase her evening Lantus insulin by 5 units, but continue the morning Lantus dose.

## 2020-05-03 NOTE — Telephone Encounter (Signed)
Called and left a detailed message for pt advising to increase evening Lantus by 5 units.

## 2020-05-05 ENCOUNTER — Telehealth: Payer: Self-pay | Admitting: Internal Medicine

## 2020-05-05 NOTE — Telephone Encounter (Signed)
Patient called asking if it was OK to take her lantus (normally takes at night time) in the morning along with her novolog before breakfast? Patient ph# [phone]

## 2020-05-05 NOTE — Telephone Encounter (Signed)
She is taking Lantus twice a day, but yes, it is ok to take the morning dose of Lantus along with NovoLog, no problem.

## 2020-05-06 DIAGNOSIS — Z6827 Body mass index (BMI) 27.0-27.9, adult: Secondary | ICD-10-CM | POA: Diagnosis not present

## 2020-05-06 DIAGNOSIS — H109 Unspecified conjunctivitis: Secondary | ICD-10-CM | POA: Diagnosis not present

## 2020-05-06 DIAGNOSIS — E1140 Type 2 diabetes mellitus with diabetic neuropathy, unspecified: Secondary | ICD-10-CM | POA: Diagnosis not present

## 2020-05-06 DIAGNOSIS — J309 Allergic rhinitis, unspecified: Secondary | ICD-10-CM | POA: Diagnosis not present

## 2020-05-06 DIAGNOSIS — F329 Major depressive disorder, single episode, unspecified: Secondary | ICD-10-CM | POA: Diagnosis not present

## 2020-05-06 NOTE — Telephone Encounter (Signed)
Called pt no answer, left vm informing her that it is ok to take the lantus and Novolog together

## 2020-05-10 ENCOUNTER — Encounter: Payer: Self-pay | Admitting: *Deleted

## 2020-05-13 DIAGNOSIS — L900 Lichen sclerosus et atrophicus: Secondary | ICD-10-CM | POA: Diagnosis not present

## 2020-05-13 DIAGNOSIS — N200 Calculus of kidney: Secondary | ICD-10-CM | POA: Diagnosis not present

## 2020-05-13 DIAGNOSIS — N393 Stress incontinence (female) (male): Secondary | ICD-10-CM | POA: Diagnosis not present

## 2020-05-13 DIAGNOSIS — N3020 Other chronic cystitis without hematuria: Secondary | ICD-10-CM | POA: Diagnosis not present

## 2020-05-26 ENCOUNTER — Ambulatory Visit (INDEPENDENT_AMBULATORY_CARE_PROVIDER_SITE_OTHER): Payer: Medicare Other | Admitting: Gastroenterology

## 2020-06-01 DIAGNOSIS — H1045 Other chronic allergic conjunctivitis: Secondary | ICD-10-CM | POA: Diagnosis not present

## 2020-06-16 ENCOUNTER — Ambulatory Visit: Payer: Medicare Other | Admitting: Neurology

## 2020-06-23 DIAGNOSIS — H11123 Conjunctival concretions, bilateral: Secondary | ICD-10-CM | POA: Diagnosis not present

## 2020-06-23 DIAGNOSIS — H04123 Dry eye syndrome of bilateral lacrimal glands: Secondary | ICD-10-CM | POA: Diagnosis not present

## 2020-06-23 DIAGNOSIS — H0102B Squamous blepharitis left eye, upper and lower eyelids: Secondary | ICD-10-CM | POA: Diagnosis not present

## 2020-06-23 DIAGNOSIS — H1045 Other chronic allergic conjunctivitis: Secondary | ICD-10-CM | POA: Diagnosis not present

## 2020-06-23 DIAGNOSIS — H2513 Age-related nuclear cataract, bilateral: Secondary | ICD-10-CM | POA: Diagnosis not present

## 2020-06-23 DIAGNOSIS — H0102A Squamous blepharitis right eye, upper and lower eyelids: Secondary | ICD-10-CM | POA: Diagnosis not present

## 2020-06-30 ENCOUNTER — Telehealth: Payer: Self-pay | Admitting: Internal Medicine

## 2020-06-30 NOTE — Telephone Encounter (Signed)
Please advise 

## 2020-06-30 NOTE — Telephone Encounter (Signed)
Patient requests to be called at ph# [phone] re: patient states that since changing dosage of Lantus, Patient's blood sugars have been running high in the last 2 weeks. Patient states her blood sugars have been over 200 with the highest being 311 on 06/27/20 in the pm. Patient states her am blood sugars have been running highest between 190 and 259 in the last week.  Blood sugars this morning were 259 when she woke up.

## 2020-06-30 NOTE — Telephone Encounter (Signed)
[name], Per my records, she is on the following regimen: - Metformin 1000 mg with dinner  - NovoLog: 8-10 units in am 8-10 units with lunch 8-10 units with dinner  + sliding scale - Lantus 15 units in am and 20-25 units at bedtime  If she is using the above regimen, she can try to increase the Lantus at night to 28-30 units. Ty! C

## 2020-07-01 NOTE — Telephone Encounter (Signed)
Called and spoke with pt to confirm the dosage of insulin , pt is going by the sliding scale. NovoLog 8-10 units   in the am , lunchtime and dinner. Pt is going increase Lantus from 20-25 to 28-30 at night to see if this helps.

## 2020-07-02 ENCOUNTER — Emergency Department (HOSPITAL_COMMUNITY)
Admission: EM | Admit: 2020-07-02 | Discharge: 2020-07-02 | Disposition: A | Discharge status: Home / Self Care | Payer: No Typology Code available for payment source | Attending: Emergency Medicine | Admitting: Emergency Medicine

## 2020-07-02 ENCOUNTER — Emergency Department (HOSPITAL_COMMUNITY): Payer: No Typology Code available for payment source

## 2020-07-02 ENCOUNTER — Encounter (HOSPITAL_COMMUNITY): Payer: Self-pay | Admitting: Emergency Medicine

## 2020-07-02 ENCOUNTER — Other Ambulatory Visit: Payer: Self-pay

## 2020-07-02 DIAGNOSIS — G311 Senile degeneration of brain, not elsewhere classified: Secondary | ICD-10-CM | POA: Diagnosis not present

## 2020-07-02 DIAGNOSIS — S0181XA Laceration without foreign body of other part of head, initial encounter: Secondary | ICD-10-CM

## 2020-07-02 DIAGNOSIS — Z7984 Long term (current) use of oral hypoglycemic drugs: Secondary | ICD-10-CM | POA: Diagnosis not present

## 2020-07-02 DIAGNOSIS — S0993XA Unspecified injury of face, initial encounter: Secondary | ICD-10-CM | POA: Diagnosis not present

## 2020-07-02 DIAGNOSIS — Z79899 Other long term (current) drug therapy: Secondary | ICD-10-CM | POA: Insufficient documentation

## 2020-07-02 DIAGNOSIS — Y92410 Unspecified street and highway as the place of occurrence of the external cause: Secondary | ICD-10-CM | POA: Insufficient documentation

## 2020-07-02 DIAGNOSIS — I10 Essential (primary) hypertension: Secondary | ICD-10-CM | POA: Insufficient documentation

## 2020-07-02 DIAGNOSIS — Z043 Encounter for examination and observation following other accident: Secondary | ICD-10-CM | POA: Diagnosis not present

## 2020-07-02 DIAGNOSIS — J9811 Atelectasis: Secondary | ICD-10-CM | POA: Diagnosis not present

## 2020-07-02 DIAGNOSIS — S0121XA Laceration without foreign body of nose, initial encounter: Secondary | ICD-10-CM | POA: Insufficient documentation

## 2020-07-02 DIAGNOSIS — Z794 Long term (current) use of insulin: Secondary | ICD-10-CM | POA: Insufficient documentation

## 2020-07-02 DIAGNOSIS — E1042 Type 1 diabetes mellitus with diabetic polyneuropathy: Secondary | ICD-10-CM | POA: Insufficient documentation

## 2020-07-02 DIAGNOSIS — R519 Headache, unspecified: Secondary | ICD-10-CM | POA: Insufficient documentation

## 2020-07-02 DIAGNOSIS — S0992XA Unspecified injury of nose, initial encounter: Secondary | ICD-10-CM | POA: Diagnosis present

## 2020-07-02 MED ORDER — OXYCODONE-ACETAMINOPHEN 5-325 MG PO TABS
1.0000 | ORAL_TABLET | Freq: Once | ORAL | Status: AC
  Administered 2020-07-02: 1 via ORAL
  Filled 2020-07-02: qty 1

## 2020-07-02 MED ORDER — LIDOCAINE HCL (PF) 1 % IJ SOLN
INTRAMUSCULAR | Status: AC
  Administered 2020-07-02: 5 mL
  Filled 2020-07-02: qty 5

## 2020-07-02 MED ORDER — LIDOCAINE HCL (PF) 1 % IJ SOLN: 5.0000 mL | Freq: Once | INTRAMUSCULAR | Status: AC

## 2020-07-02 NOTE — Discharge Instructions (Addendum)
Follow-up with your primary doctor in approximately 1 week for suture removal.  Coming to ER if you develop worsening pain, vomiting, difficulty breathing or other new concerning symptom.  Take Tylenol or Motrin as needed for pain control.
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
## 2020-07-11 ENCOUNTER — Encounter: Payer: Self-pay | Admitting: Internal Medicine

## 2020-07-11 ENCOUNTER — Ambulatory Visit (INDEPENDENT_AMBULATORY_CARE_PROVIDER_SITE_OTHER): Payer: Medicare Other | Admitting: Internal Medicine

## 2020-07-11 ENCOUNTER — Other Ambulatory Visit: Payer: Self-pay

## 2020-07-11 VITALS — BP 142/88 | HR 95 | Ht 64.0 in | Wt 178.4 lb

## 2020-07-11 DIAGNOSIS — G63 Polyneuropathy in diseases classified elsewhere: Secondary | ICD-10-CM | POA: Diagnosis not present

## 2020-07-11 DIAGNOSIS — E139 Other specified diabetes mellitus without complications: Secondary | ICD-10-CM | POA: Diagnosis not present

## 2020-07-11 DIAGNOSIS — E785 Hyperlipidemia, unspecified: Secondary | ICD-10-CM
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
## 2020-07-11 NOTE — Patient Instructions (Addendum)
Please continue: - Metformin 1000 mg with dinner - Lantus 15 units in am and 30 units at bedtime - NovoLog:  8-10 units in am 8-10 units with lunch 8-10 units with dinner If your sugars are <100 and you eat a low carb meal, inject only ~6 units. - Novolog Sliding scale: 201-250: + 2 units 251-300: + 3 units 301-350: + 4 units >350: + 5 units  Try to reduce sweets.  Please return in 3 months with your sugar log.

## 2020-07-12 ENCOUNTER — Telehealth: Payer: Self-pay | Admitting: Neurology

## 2020-07-12 DIAGNOSIS — Z6829 Body mass index (BMI) 29.0-29.9, adult: Secondary | ICD-10-CM | POA: Diagnosis not present

## 2020-07-12 DIAGNOSIS — E663 Overweight: Secondary | ICD-10-CM | POA: Diagnosis not present

## 2020-07-12 DIAGNOSIS — Z4802 Encounter for removal of sutures: Secondary | ICD-10-CM | POA: Diagnosis not present

## 2020-07-12 DIAGNOSIS — S0121XD Laceration without foreign body of nose, subsequent encounter: Secondary | ICD-10-CM | POA: Diagnosis not present

## 2020-07-12 NOTE — Telephone Encounter (Signed)
Pt husband came into the office to schedule a Fu with Dr. Lucia Gaskins. She is ready to proceed with testing per the last visit. Spoke to [place] and she advised per Pt chart was referred to Psych and may need a new referral. Please reach out to the Pt to advise. Thank you

## 2020-07-13 LAB — FRUCTOSAMINE: Fructosamine: 336 umol/L — ABNORMAL HIGH (ref 205–285)

## 2020-07-18 DIAGNOSIS — H2513 Age-related nuclear cataract, bilateral: Secondary | ICD-10-CM | POA: Diagnosis not present

## 2020-07-18 DIAGNOSIS — H0102A Squamous blepharitis right eye, upper and lower eyelids: Secondary | ICD-10-CM | POA: Diagnosis not present

## 2020-07-18 DIAGNOSIS — H04123 Dry eye syndrome of bilateral lacrimal glands: Secondary | ICD-10-CM | POA: Diagnosis not present

## 2020-07-18 DIAGNOSIS — H0102B Squamous blepharitis left eye, upper and lower eyelids: Secondary | ICD-10-CM | POA: Diagnosis not present

## 2020-07-18 DIAGNOSIS — H1045 Other chronic allergic conjunctivitis: Secondary | ICD-10-CM | POA: Diagnosis not present

## 2020-07-18 DIAGNOSIS — H11123 Conjunctival concretions, bilateral: Secondary | ICD-10-CM | POA: Diagnosis not present

## 2020-07-20 ENCOUNTER — Other Ambulatory Visit: Payer: Self-pay | Admitting: Internal Medicine

## 2020-08-30 ENCOUNTER — Telehealth: Payer: Self-pay | Admitting: Internal Medicine

## 2020-08-30 NOTE — Telephone Encounter (Signed)
T,  Per my records, she is taking Lantus 15 units in a.m. and 30 units at night.  Let's increase to 20 units in a.m. and continue 30 units at night.  Please let me know how the sugars are doing before the weekend.

## 2020-08-30 NOTE — Telephone Encounter (Signed)
Pt states that she would like to leave three days of numbers with Dr.Gherghe pt states that she is taking the insulin like Dr.Gherghe has prescribed.  08/25/2020     08/28/2020 240 Breakfast     224 breakfast 233 lunch    274 lunch 104 before dinner    216 supper 204 bedtime     189 bedtime   08/26/2020 267 breakfast  255 lunch  267 dinner  159 bedtime  08/27/2020 347 breakfast  225 lunch 287 dinner 337 bedtime

## 2020-08-30 NOTE — Telephone Encounter (Signed)
Please advise 

## 2020-08-30 NOTE — Telephone Encounter (Signed)
Called and advised pt to increase Lantus morning dose. Pt advised to call back Friday afternoon with BS readings. Pt verbalized understanding.

## 2020-09-09 ENCOUNTER — Telehealth: Payer: Self-pay | Admitting: Internal Medicine

## 2020-09-09 NOTE — Telephone Encounter (Signed)
Please advise 

## 2020-09-09 NOTE — Telephone Encounter (Signed)
[name], Per my records, she is now taking: Lantus 20 units in a.m. and 30 units at night Please advise her to decrease the morning dose to only 15 units.  Continuing the same dose of the evening dose.

## 2020-09-09 NOTE — Telephone Encounter (Signed)
Pt states that she has been sick for about 3 days, with stomach upset. Pt states that she has had trouble getting her sugar above 90. Pt providing blood sugars.   09/05/2020 109 when waking up  153 lunch 41 at bedtime   09/06/2020 196 when waking up  42 lunch time  151 at bedtime   09/07/2020 90 when waking up  09/08/2020 90 in the morning  88 lunch time  96 dinner  127 at bedtime.  09/06/2020 79 when waking up    Pt would like to know what to do about this.

## 2020-09-12 NOTE — Telephone Encounter (Signed)
Called and advised pt decrease the morning dose to only 15 units.  Continuing the same dose of the evening dose.

## 2020-09-12 NOTE — Telephone Encounter (Signed)
Forwarding

## 2020-10-03 ENCOUNTER — Telehealth: Payer: Self-pay | Admitting: Internal Medicine

## 2020-10-03 NOTE — Telephone Encounter (Signed)
Pt has been feeling yucky lately, nauseas mostly. Hasn't been able to eat. Sugars have been up and down with 221 this AM (10/03/2020). Would like advice on how to control her sugars with being so sick and not eating. Pt contact [phone].

## 2020-10-04 NOTE — Telephone Encounter (Signed)
Patient called to find out what to do with her erratic blood sugars - advises that she has called other times for same.  Placed patient on hold to see if there was anyone who could assist her, but she hung up.  Call patient @ [phone]

## 2020-10-04 NOTE — Telephone Encounter (Signed)
Called and advised pt of Dr's suggestions. Pt advised she will continue to monitor her sugars and follow up in a few days.

## 2020-10-04 NOTE — Telephone Encounter (Signed)
If the sugars remain high, she needs to continue to take insulin around the time of meals (same slightly lower doses), even if she is not eating.

## 2020-10-04 NOTE — Telephone Encounter (Signed)
Please advise 

## 2020-10-04 NOTE — Telephone Encounter (Signed)
Called and spoke with pt and she is now experiencing lows since yesterday and this morning sugar was 116 pt took insulin 10 units of novolog before breakfast of a bowl of cereal. 2 hours after breakfast sugar was 64 pt ate banana sandwich (no insulin) 2 hours after lunch blood sugar was 87 and pt is now taking a glucose tablet Pt is taking  20 units of Lantus at bedtime and an additional 15 units around 2:30 am (not always checking blood sugar when taking additional units of Lantus) Please advise

## 2020-10-04 NOTE — Telephone Encounter (Signed)
Due to the variability in her blood sugars, I would not suggest to decrease the insulin based on 1 blood sugar in the 60s, but if she continues to experience these, we may need to reduce the Lantus dose or the NovoLog depending on when they occur.

## 2020-10-05 NOTE — Telephone Encounter (Signed)
Spoke with the patient in regards to her low BS readings, expressed that she was just nervous and is fine now. Will address any further concerns at next follow up.   Sugar Readings Given 9:00 a.m. -154 patient ate oatmeal and took 20 units of insulin 2:00 p.m. -71 patient ate a ham sandwich and soup and didn't take insulin to avoid going low 2:43 p.m. - 80

## 2020-10-05 NOTE — Telephone Encounter (Signed)
Pt @ 1400 BS 71. Pt states that if she doesn't eat every hour her bs will drop. Pt would like advice what to do. Pt contact [phone]

## 2020-10-09 ENCOUNTER — Other Ambulatory Visit: Payer: Self-pay | Admitting: Internal Medicine

## 2020-10-15 ENCOUNTER — Telehealth: Payer: Self-pay | Admitting: Internal Medicine

## 2020-10-15 NOTE — Telephone Encounter (Signed)
Received  a call from access nurse with pt calling regarding hypoglycemia over the past ~ 2 weeks.    She is in lantus 30 units QHS and at 2:15 AM ? She is also on prandial insulin 8-10 units PLUS a correction scale    Pt has gastroparesis and her symptoms have been worse    Recommendation to access nurse   STOP Lantus at 2:15 AM  Continue Lantus 30 units QHS  Use prandial insulin if she were to eat only   , as it appears she has been taking this amount regadless of her intake     Abby Raelyn Mora, MD  [facility] Endocrinology  [facility] [address] [place], [place] [address] Phone: [phone] FAX: [phone]

## 2020-10-17 ENCOUNTER — Encounter: Payer: Self-pay | Admitting: Internal Medicine

## 2020-10-17 ENCOUNTER — Other Ambulatory Visit: Payer: Self-pay

## 2020-10-17 ENCOUNTER — Telehealth: Payer: Self-pay | Admitting: Internal Medicine

## 2020-10-17 ENCOUNTER — Ambulatory Visit (INDEPENDENT_AMBULATORY_CARE_PROVIDER_SITE_OTHER): Payer: Medicare Other | Admitting: Internal Medicine

## 2020-10-17 VITALS — BP 144/82 | HR 94 | Ht 64.0 in | Wt 177.6 lb

## 2020-10-17 DIAGNOSIS — E139 Other specified diabetes mellitus without complications: Secondary | ICD-10-CM

## 2020-10-17 DIAGNOSIS — E785 Hyperlipidemia, unspecified: Secondary | ICD-10-CM

## 2020-10-17 DIAGNOSIS — G63 Polyneuropathy in diseases classified elsewhere: Secondary | ICD-10-CM

## 2020-10-17 LAB — LIPID PANEL
Cholesterol: 178 mg/dL (ref 0–200)
HDL: 45.7 mg/dL (ref >39.00)
LDL Cholesterol: 100 mg/dL — ABNORMAL HIGH (ref 0–99)
NonHDL: 132.63
Total CHOL/HDL Ratio: 4
Triglycerides: 165 mg/dL — ABNORMAL HIGH (ref 0.0–149.0)
VLDL: 33 mg/dL (ref 0.0–40.0)

## 2020-10-17 MED ORDER — BAQSIMI ONE PACK 3 MG/DOSE NA POWD: NASAL | 99 refills | Status: DC

## 2020-10-17 MED ORDER — LANTUS SOLOSTAR 100 UNIT/ML ~~LOC~~ SOPN: PEN_INJECTOR | SUBCUTANEOUS | 3 refills | Status: DC

## 2020-10-17 NOTE — Telephone Encounter (Signed)
Husband arrived and requested that Glucagon (BAQSIMI ONE PACK) & insulin glargine (LANTUS SOLOSTAR) be sent to [facility] - [place], [place] - [address], not [facility]. Husband's contact [phone]

## 2020-10-17 NOTE — Patient Instructions (Addendum)
Please continue: - Metformin 500 mg 2x a day with meals - Lantus 15 units in am (at 2 am) and 30 units at bedtime - NovoLog:  8-10 units in am 8-10 units with lunch  Change: 6-8 units with dinner  If your sugars are <100 and you eat a low carb meal, inject only ~6 units. - Novolog Sliding scale: 201-250: + 2 units 251-300: + 3 units 301-350: + 4 units >350: + 5 units  The most common suppliers for the continuous glucose monitor are: [employer]: [phone] [employer]: [phone] [employer]: [phone] [facility] Services: [phone] [employer]: [phone] [place] [facility]: [phone]  Please return in 3 months with your sugar log.   Sick Day Rules for Diabetes  Think S-K-I-L-L:  Sugars:  - if glucose >200, check every 3h and drink sugar free liquids  - if glucose <200, drink carb-containing liquids and recheck 30 min later  - if glucose high, correct with insulin  - if sugars <60, initiate hypoglycemia management (take 15 g of fast carbs and check sugars in 15 min  - repeat until sugars remain >100).  Ketones:  When to check ketones?  When glucose >300 x2 if on insulin injections When nausea, vomiting, diarrhea, abdominal pain, headache, fever - even if glucose is normal or low - because in this case, you need both glucose and insulin.    - if you have ketone strips for blood >> if ketones are more or equal than 0.6, need to increase insulin - if you have ketone strips for urine >> if ketones are more or equal than "small", need to increase insulin  Insulin: Never skip long acting insulin, even if not eating!   Urine ketones Blood ketones Extra insulin?  no <0.6 no  small 0.6-1.5 Increase dose by 5%  moderate 1.5-3 Increase dose by 10%  large >3 Increase dose by at least 20%   Liquids: - if glucose >200, check every 3h and drink sugar free liquids  - if glucose <200, small sips of carb-containing liquids  (e.g. Ginger ale, Gatorade, juice, etc.)  Let us know!   Call us if: Go to ED if: Call your primary care doctor if:  Sugars >300 for >8h Severe abdominal pain Fever >100F for 24h  Moderate to large  urine ketones or blood ketones >1.5 Difficulty breathing Other chronic diseases flaring up  Vomiting and unable to keep liquids down Signs of dehydration

## 2020-10-17 NOTE — Progress Notes (Signed)
Patient ID: [name], female   DOB: 12-26-1955, 65 y.o.   MRN: [id number]  This visit occurred during the SARS-CoV-2 public health emergency.  Safety protocols were in place, including screening questions prior to the visit, additional usage of staff PPE, and extensive cleaning of exam room while observing appropriate contact time as indicated for disinfecting solutions.   HPI: [name] is a 65 y.o.-year-old female, initially referred by her PCP, Dr. Assunta Found (PA: Lenise Herald), returning for f/u for LADA, dx 2005, insulin-dependent since ~2006, controlled, with complications (PN, gastroparesis?).  Last visit 3 months ago. She is here with her husband who offers part of the history especially related to her blood sugars, symptoms.  Interim history: She is bipolar and also has short-term memory loss and disequilibrium.  She sees neurology. She contacted me since last visit with lower blood sugars due to nausea, being sick and not able to eat well.  We adjusted the Lantus dose. No persistently increased urination, blurry vision, chest pain. She does have nausea.  Reviewed history: She was on an Omnipod insulin pump on 08/01/2015. She had severe anxiety and depression and got so overwhelmed with the insulin pump that we had to take her off the pump.  We switched her to a basal-bolus insulin regimen, but she was unable to calculate the insulin doses based on insulin to carb ratio, so now she is using fixed rapid acting insulin doses.  Reviewed HbA1c levels: 07/11/2020: HbA1c calculated from fructosamine is 7.3%, slightly higher than before. 04/21/2020: HbA1c calculated from fructosamine has improved to 7.1%. Lab Results  Component Value Date   HGBA1C 5.0 03/03/2020   HGBA1C 5.8 (A) 11/09/2019   HGBA1C 5.7 (A) 06/30/2019  03/04/2018:  Hba1c calculated from fructosamine is the best in a long time, at 6.84%! 12/19/2017: HbA1c calculated from fructosamine has improved to  7.1%! 09/12/2017: HbA1c  Calculated from the fructosamine is 7.5%, improved from before. 05/07/2017: HbA1c calculated from the fructosamine was 7.7% 02/07/2017: HbA1c calculated from the fructosamine was higher, at 7.7%. 11/07/2016: HbA1c calc. from the fructosamine is a little lower, at 7.25% 08/07/2016: HbA1c calculated from the fructosamine is 7.5% 05/31/2016: HbA1c calculated from fructosamine is better, at 7.25% 12/15/2015: HbA1c calculated from the fructosamine is higher, 8.4%. 08/24/2015:  HbA1c calculated from fructosamine is 7.4%. 05/24/2015: HbA1c calculated from fructosamine is 6.9%.  In 01/2019, she had a professional CGM attached: [employer] professional CGM traces between 12/31/2018 at 01/11/2019 were analyzed and the reports will be scanned.  Patient's sugars are variable but consistent patterns were noticed: Sugars are low in the second half of the night and morning, but they increase significantly after breakfast.  Around lunchtime she starts to drop her sugars and they increase but in the less significant fashion after dinner.  Patient was advised to: - reduce the dose of Lantus to 30 units for now.  - If the sugars after breakfast remain high after reducing the Lantus,  increase the dose of rapid acting insulin with breakfast by 2 units. - inject approximately 15 minutes before the meal to see if this helps reducing the sugars after breakfast. -  One of her concerns was about what to do if she is not hungry at lunch.  Reducing the Lantus will help her not drop even if she is not eating, however, if she does feel like she is dropping she may need glucose tablets, 2-4 to keep her sugars up.  She is on: - Metformin 1000 mg with  dinner >> 500 mg 2x a day - Lantus  34 >> 30 >> 32-34 >> 30-34 >> 36 >> 30 units at bedtime and 15 units 4h later (2 am) >> 15 units at 2:30 a.m. and 30 units at bedtime - NovoLog:  10-12 >> 8-10 units in am 10-12 >> 8-10 units with lunch 10-12 >>  8-10 units with dinner - Novolog Sliding scale: 201-250: + 2 units 251-300: + 3 units 301-350: + 4 units >350: + 5 units  Pt checks her sugars ~2-4 times a day per review of her log: - am:  85, 115-247, 325 >> 71, 74, 130-228, 253, 328 >> 76, 95-219, 236 - 2h after b'fast: 58, 111 >> n/c >> 149, 229 >> 177, 208, 385 >> 54, 274 - before lunch:  43, 46-228, 295, 351 >> 45, 54, 72, 75-210, 301, 314 >> 51-170, 222 - 2h after lunch: 43 >> n/c >> 45-178, 209 >> 126-162, 354 >> n/c - before dinner: 51, 79-283, 340, 355 >> n/c >> 40-298 >> 65-270 >> 63-187, 204 - 2h after dinner: n/c >> 45 >> n/c >> 77-210 >> 265 >> 30, 43-77, 235 - bedtime: 210, 219 >> 71-239, 339 >> 80-285, 311, 340 >>  37,41-307, 319, 349 - at night: 51, 88, 134 >> n/c >> 52, 65 >> 83, 92, 383  >> 34, 45, 79, 124 Lowest sugar was 32 >>... 40 ; she has hypoglycemia awareness in the 50s Highest sugar was 351 >> 383.  No CKD, last BUN/creatinine:  Lab Results  Component Value Date   BUN 6 (L) 03/06/2020   CREATININE 0.71 03/06/2020   No microalbuminuria: Lab Results  Component Value Date   MICRALBCREAT 5.4 11/07/2016    + Dyslipidemia; lipids were checked 6 mo ago by PCP - no records: Lab Results  Component Value Date   CHOL 82 02/28/2019   HDL 22 (L) 02/28/2019   LDLCALC 39 02/28/2019   TRIG 105 02/28/2019   CHOLHDL 3.7 02/28/2019  Not on a statin.  -Last eye exam was on 08/2020: No DR reportedly -my [facility]: Dr. Janne Lab.  -+ Numbness and tingling in her feet.  Also, occasional pain.  She is on Neurontin.  Also on nortriptyline.  These are refilled by PCP. She sees a podiatrist [facility]).  She had 2 toenails removed in the past due to fungal infection.  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  She has a diagnosis of Parkinson's disease.  She also has bipolar disease. Also: GERD, HTN, fibromyalgia>> on Neurontin.  Latest TSH was normal: Lab Results  Component Value Date   TSH 0.533  03/03/2020   ROS: + See HPI Neurological: no tremors/+ numbness/+ tingling/no dizziness, + disequilibrium  I reviewed pt's medications, allergies, PMH, social hx, family hx, and changes were documented in the history of present illness. Otherwise, unchanged from my initial visit note.  Past Medical History:  Diagnosis Date   Anxiety    Bipolar 1 disorder (HCC)    Breast density 06/25/2012   Right breast density, will get mammogram and [place]   Depression    Diabetes mellitus without complication (HCC)    type 1   Duodenitis    GERD (gastroesophageal reflux disease)    History of kidney stones    HTN (hypertension)    Hyperlipidemia    IBS (irritable bowel syndrome)    Neuropathy    Sepsis (HCC) 02/27/2019   Past Surgical History:  Procedure Laterality Date  ABDOMINAL HYSTERECTOMY     partial   CHOLECYSTECTOMY     COLONOSCOPY     CYSTOSCOPY WITH RETROGRADE PYELOGRAM, URETEROSCOPY AND STENT PLACEMENT Right 03/31/2019   Procedure: CYSTOSCOPY WITH RETROGRADE PYELOGRAM, URETEROSCOPY AND STENT PLACEMENT;  Surgeon: Noel Christmas, MD;  Location: [facility];  Service: Urology;  Laterality: Right;   CYSTOSCOPY WITH STENT PLACEMENT Right 03/02/2019   Procedure: CYSTOSCOPY WITH STENT PLACEMENT;  Surgeon: Noel Christmas, MD;  Location: [facility] OR;  Service: Urology;  Laterality: Right;   HOLMIUM LASER APPLICATION Right 03/31/2019   Procedure: HOLMIUM LASER APPLICATION;  Surgeon: Noel Christmas, MD;  Location[facility];  Service: Urology;  Laterality: Right;   UPPER GASTROINTESTINAL ENDOSCOPY     Social History   Socioeconomic History   Marital status: Married    Spouse name: phillip   Number of children: 1   Years of education: 12   Highest education level: High school graduate  Occupational History   Occupation: Disabled  Tobacco Use   Smoking status: Never   Smokeless tobacco: Never  Vaping Use   Vaping Use: Some days   Substances: Flavoring   Substance and Sexual Activity   Alcohol use: No    Alcohol/week: 0.0 standard drinks   Drug use: No   Sexual activity: Not Currently  Other Topics Concern   Not on file  Social History Narrative   Lives at home w/ her husband   Left-handed   Caffeine: 1-2 cups of coffee daily   Social Determinants of Health   Financial Resource Strain: Not on file  Food Insecurity: Not on file  Transportation Needs: Not on file  Physical Activity: Not on file  Stress: Not on file  Social Connections: Not on file  Intimate Partner Violence: Not on file   Current Outpatient Medications on File Prior to Visit  Medication Sig Dispense Refill   acetaminophen (TYLENOL) 500 MG tablet Take 1,000 mg by mouth every 6 (six) hours as needed for mild pain or headache.     ALPRAZolam (XANAX) 1 MG tablet Take 1 mg by mouth 4 (four) times daily as needed for anxiety.      AMBULATORY NON FORMULARY MEDICATION Squatty Potty x 1 1 each 0   amLODipine (NORVASC) 5 MG tablet Take 5 mg by mouth daily.     BD PEN NEEDLE NANO U/F 32G X 4 MM MISC Use with insulin pen 4 times a day 400 each 3   BENICAR 40 MG tablet Take 40 mg by mouth daily.      busPIRone (BUSPAR) 10 MG tablet Take 10 mg by mouth in the morning and at bedtime.     cholecalciferol (VITAMIN D3) 25 MCG (1000 UNIT) tablet Take 1,000 Units by mouth daily.     Docusate Sodium (COLACE PO) Take 100 mg by mouth daily.     esomeprazole (NEXIUM) 40 MG capsule Take 40 mg by mouth 2 (two) times daily.     gabapentin (NEURONTIN) 800 MG tablet Take 800 mg by mouth 3 (three) times daily.      glucagon 1 MG injection Inject 1 mg into the muscle once as needed for up to 1 dose. 1 each 12   glucose blood (FREESTYLE LITE) test strip USE TO CHECK BLOOD SUGAR 6 TIMES PER DAY 600 each 3   insulin aspart (NOVOLOG FLEXPEN) 100 UNIT/ML FlexPen Inject 10 -12 Units into the skin daily after breakfast AND 8 -10 Units daily before lunch AND 10-12 Units  daily before supper.  201-250=2  units, 251-300=3 units, 301-350=4units, >350= 5 unitsMax daily 40 units. 45 mL 3   Insulin Syringes, Disposable, U-100 1 ML MISC To use for insulin injection.. 100 each 1   Lancets (FREESTYLE) lancets USE TO TEST BLOOD SUGAR 4 TO 6 TIMES DAILY AS INSTRUCTED 300 each 6   LANTUS SOLOSTAR 100 UNIT/ML Solostar Pen INJECT 36 UNITS UNDER THE SKIN AT BEDTIME 30 mL 0   losartan (COZAAR) 25 MG tablet Take 25 mg by mouth daily.     metFORMIN (GLUCOPHAGE) 500 MG tablet TAKE 1 TABLET TWICE A DAY WITH MEALS 180 tablet 3   nortriptyline (PAMELOR) 50 MG capsule Take 50 mg by mouth at bedtime.      ondansetron (ZOFRAN-ODT) 4 MG disintegrating tablet PLACE 1 TO 2 TABLETS ON TONGUE EVERY 4 TO 6 HOURS AS NEEDED FOR NAUSEA (Patient taking differently: Take 4-8 mg by mouth every 4 (four) hours as needed for nausea or vomiting.) 180 tablet 4   OSCIMIN 0.125 MG SUBL DISSOLVE 1 TABLET UNDER THE TONGUE EVERY 4 HOURS AS NEEDED (NEED APPOINTMENT) 360 tablet 0   Probiotic CAPS Take 1 capsule by mouth daily. 30 capsule 1   Propylene Glycol (SYSTANE BALANCE OP) Place 1 drop into both eyes daily as needed (dry eyes).     traZODone (DESYREL) 100 MG tablet 1  qhs  May increse to 2  qhs  After 1 week (Patient taking differently: Take 100 mg by mouth at bedtime. 1  qhs  May increse to 2  qhs  After 1 week) 180 tablet 1   venlafaxine (EFFEXOR) 37.5 MG tablet Take 37.5 mg by mouth daily.     vitamin B-12 (CYANOCOBALAMIN) 1000 MCG tablet Take 1,000 mcg by mouth daily.     No current facility-administered medications on file prior to visit.   Allergies  Allergen Reactions   Abilify [Aripiprazole]     Patient is intolerant   Phenergan [Promethazine] Diarrhea and Nausea And Vomiting   Reglan [Metoclopramide] Other (See Comments)    Chest pains   Family History  Problem Relation Age of Onset   Hypertension Mother    Bipolar disorder Mother    Cancer Mother    Heart disease Brother    Thyroid disease Sister    Hypertension  Sister    Bipolar disorder Sister    Depression Father    Cancer Father    Bipolar disorder Daughter    Bipolar disorder Maternal Aunt    Colon cancer Neg Hx    Colon polyps Neg Hx    Esophageal cancer Neg Hx    Kidney disease Neg Hx    Gallbladder disease Neg Hx    Diabetes Neg Hx    Dementia Neg Hx     PE: BP (!) 144/82 (BP Location: Right Arm, Patient Position: Sitting, Cuff Size: Normal)   Pulse 94   Ht 5\' 4"  (1.626 m)   Wt 177 lb 9.6 oz (80.6 kg)   SpO2 97%   BMI 30.48 kg/m    Wt Readings from Last 3 Encounters:  10/17/20 177 lb 9.6 oz (80.6 kg)  07/11/20 178 lb 6.4 oz (80.9 kg)  07/02/20 170 lb (77.1 kg)   Constitutional: slightly overweight, in NAD Eyes: PERRLA, EOMI, no exophthalmos ENT: moist mucous membranes, no thyromegaly, no cervical lymphadenopathy Cardiovascular: RRR, No MRG Respiratory: CTA B Gastrointestinal: abdomen soft, NT, ND, BS+ Musculoskeletal: no deformities, strength intact in all 4 Skin: moist, warm, + many tatoos Neurological: no  tremor with outstretched hands, DTR normal in all 4  ASSESSMENT: 1. 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  Component     Latest Ref Rng 07/16/2013  C-Peptide     0.80 - 3.90 ng/mL 1.29  Glucose     70 - 99 mg/dL 161 (H)  Glutamic Acid Decarb Ab     <=1.0 U/mL 11.3 (H)  Pancreatic Islet Cell Antibody     <5 JDF Units 5 (A)  + anti-pancreatic antibodies >> LADA rather than type 2 DM. She still has a positive C peptide >> still has insulin secretion.  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 HL  PLAN:  1.  Patient with poorly controlled LADA, difficult to manage due to depression/anxiety/GI problems.  Blood sugars usually fluctuate between the 70s and 300s.  In the past, she was on an insulin pump and she did not do well with it due to anxiety.  She also had a professional CGM placed and  we adjusted her insulin doses based on the patterns in the past.  However, she did not want to start a CGM afterwards.  Before last visit, we split her Lantus dose and she felt that this was working better for her.  We adjusted the doses since last visit, as she does usually communicate with me about her blood sugars between visits.  Sugars remain extremely fluctuating.  At last visit, HbA1c calculated from fructosamine was 7.3%, slightly higher than before. -At today's visit, sugars appear to have been lower in the last 2 to 3 weeks.  The lowest blood sugars are after dinner, when she can drop to the 30s occasionally.  Therefore, at today's visit, we discussed about decreasing the insulin with dinner.  For now, we discussed about continuing the same Lantus dose. -We discussed at length about how to manage her diabetes in case she is sick.  At today's visit I sent the prescription for Baqsimi to her pharmacy and discussed with patient and her husband about how and when to use it.  We also discussed about sick day rules.  I advised him to check blood sugars every 2-3 hours and correct hypo or hyperglycemia as needed, even if she is not eating.  We discussed about specific scenarios of high or low blood sugars and how to correct them. -I also recommended that she tried again the continuous and close monitor.  She has anxiety so it is possible that she may get overwhelmed by the many sugar checks, however, I explained that the largest advantage is and finding out if the sugars are dropping or increasing before they get to extreme values so she can prevent these.  She and her husband will think about it and let me know.  I did give him information about CGM suppliers. - I suggested to:  Patient Instructions  Please continue: - Metformin 500 mg 2x a day with meals - Lantus 15 units in am (at 2 am) and 30 units at bedtime - NovoLog:  8-10 units in am 8-10 units with lunch  Change: 6-8 units with dinner  If  your sugars are <100 and you eat a low carb meal, inject only ~6 units. - Novolog Sliding scale: 201-250: + 2 units 251-300: + 3 units 301-350: + 4 units >350: + 5 units  The most common suppliers for the continuous glucose monitor are: [employer]: [phone] [employer]:  [phone] [employer]: [phone] [facility] Services: [phone] [employer]: [phone] [place] [employer]: [phone]  Please return in 3 months with your sugar log.   Sick Day Rules for Diabetes ...  - we will check a fructosamine level today - advised to check sugars at different times of the day - 4x a day, rotating check times - advised for yearly eye exams >> she is UTD - return to clinic in 3-4 months  2. PN  -Related to diabetes -She continues on Nortriptiline. On Gabapentin 800 mg 3x a day. -We again discussed about using alpha-lipoic acid 600 mg twice a day -In summer 2021, I referred her to physical therapy at [facility] office, upon her request, since she had this before with good results.  At previous visits I gave her again the telephone numbers at last visit but she still did not contact.  She was planning to do so but she did not contact them since last visit  3. HL -Reviewed latest lipid panel available from 02/2019: Fractions at goal with exception of a low HDL: Lab Results  Component Value Date   CHOL 82 02/28/2019   HDL 22 (L) 02/28/2019   LDLCALC 39 02/28/2019   TRIG 105 02/28/2019   CHOLHDL 3.7 02/28/2019  -She is not on a statin -She had labs with Dr. Florentina Jenny unclear whether she had a lipid panel in the last year -We will check a lipid panel today (she had oatmeal this morning)  Component     Latest Ref Rng & Units 10/17/2020  Cholesterol     0 - 200 mg/dL 415  Triglycerides     0.0 - 149.0 mg/dL 830.9 (H)  HDL Cholesterol     >39.00 mg/dL 40.76  VLDL     0.0 - 80.8 mg/dL 81.1  LDL (calc)     0 - 99 mg/dL 031  (H)  Total CHOL/HDL Ratio      4  NonHDL      132.63  Fructosamine     205 - 285 umol/L 332 (H)  HbA1c calculated from fructosamine is 7.25%.  LDL is above our target of less than 70 due to diabetes complications.  I would suggest to start a statin, for example pravastatin 20 mg daily and increase as tolerated.  Carlus Pavlov, MD PhD [facility]

## 2020-10-18 MED ORDER — LANTUS SOLOSTAR 100 UNIT/ML ~~LOC~~ SOPN: PEN_INJECTOR | SUBCUTANEOUS | 2 refills | Status: DC

## 2020-10-18 NOTE — Addendum Note (Signed)
Addended by: Lisabeth Pick on: 10/18/2020 02:57 PM   Modules accepted: Orders

## 2020-10-18 NOTE — Telephone Encounter (Signed)
90 day supply of Lantus sent

## 2020-10-18 NOTE — Telephone Encounter (Signed)
Per the pt, [facility] needs a 90days supply of insulin glargine (LANTUS SOLOSTAR) prescription before they can send it out. Pt Contact [phone]

## 2020-10-18 NOTE — Addendum Note (Signed)
Addended by: Stormy Card L on: 10/18/2020 03:00 PM   Modules accepted: Orders

## 2020-10-20 DIAGNOSIS — K219 Gastro-esophageal reflux disease without esophagitis: Secondary | ICD-10-CM | POA: Diagnosis not present

## 2020-10-20 DIAGNOSIS — I10 Essential (primary) hypertension: Secondary | ICD-10-CM | POA: Diagnosis not present

## 2020-10-20 DIAGNOSIS — F419 Anxiety disorder, unspecified: Secondary | ICD-10-CM | POA: Diagnosis not present

## 2020-10-20 DIAGNOSIS — E1140 Type 2 diabetes mellitus with diabetic neuropathy, unspecified: Secondary | ICD-10-CM | POA: Diagnosis not present

## 2020-10-20 DIAGNOSIS — Z6829 Body mass index (BMI) 29.0-29.9, adult: Secondary | ICD-10-CM | POA: Diagnosis not present

## 2020-10-20 DIAGNOSIS — J329 Chronic sinusitis, unspecified: Secondary | ICD-10-CM | POA: Diagnosis not present

## 2020-10-20 DIAGNOSIS — E663 Overweight: Secondary | ICD-10-CM | POA: Diagnosis not present

## 2020-10-20 DIAGNOSIS — Z13220 Encounter for screening for lipoid disorders: Secondary | ICD-10-CM | POA: Diagnosis not present

## 2020-10-20 DIAGNOSIS — R109 Unspecified abdominal pain: Secondary | ICD-10-CM | POA: Diagnosis not present

## 2020-10-20 LAB — FRUCTOSAMINE: Fructosamine: 332 umol/L — ABNORMAL HIGH (ref 205–285)

## 2020-10-21 MED ORDER — PRAVASTATIN SODIUM 20 MG PO TABS: 20.0000 mg | ORAL_TABLET | Freq: Every day | ORAL | 3 refills | Status: DC

## 2020-10-21 NOTE — Addendum Note (Signed)
Addended by: Kenyon Ana on: 10/21/2020 08:20 AM   Modules accepted: Orders

## 2020-10-24 DIAGNOSIS — Z23 Encounter for immunization: Secondary | ICD-10-CM | POA: Diagnosis not present

## 2020-10-27 ENCOUNTER — Telehealth: Payer: Self-pay | Admitting: Internal Medicine

## 2020-10-27 DIAGNOSIS — R109 Unspecified abdominal pain: Secondary | ICD-10-CM | POA: Diagnosis not present

## 2020-10-27 DIAGNOSIS — E1140 Type 2 diabetes mellitus with diabetic neuropathy, unspecified: Secondary | ICD-10-CM | POA: Diagnosis not present

## 2020-10-27 NOTE — Telephone Encounter (Signed)
Called and advised pt of the follow in instructions: - Lantus 15 units in am (at 2 am) and 30 units at bedtime - NovoLog:  8-10 units in am 8-10 units with lunch   Change: 6-8 units with dinner   If your sugars are <100 and you eat a low carb meal, inject only ~6 units. - Novolog Sliding scale: 201-250: + 2 units 251-300: + 3 units 301-350: + 4 units >350: + 5 units

## 2020-10-27 NOTE — Telephone Encounter (Signed)
Patient called and is confused about which insulin to take when an how much.  Is requesting a call back to [phone]

## 2020-11-02 ENCOUNTER — Other Ambulatory Visit: Payer: Self-pay | Admitting: Internal Medicine

## 2020-11-07 DIAGNOSIS — N3020 Other chronic cystitis without hematuria: Secondary | ICD-10-CM | POA: Diagnosis not present

## 2020-11-07 DIAGNOSIS — N393 Stress incontinence (female) (male): Secondary | ICD-10-CM | POA: Diagnosis not present

## 2020-11-07 DIAGNOSIS — N200 Calculus of kidney: Secondary | ICD-10-CM | POA: Diagnosis not present

## 2020-11-10 ENCOUNTER — Ambulatory Visit: Payer: TRICARE For Life (TFL) | Admitting: Cardiology

## 2020-11-15 ENCOUNTER — Encounter (INDEPENDENT_AMBULATORY_CARE_PROVIDER_SITE_OTHER): Payer: Self-pay | Admitting: *Deleted

## 2020-11-18 DIAGNOSIS — F3181 Bipolar II disorder: Secondary | ICD-10-CM | POA: Diagnosis not present

## 2020-12-01 ENCOUNTER — Other Ambulatory Visit: Payer: Self-pay | Admitting: Internal Medicine

## 2020-12-06 DIAGNOSIS — K589 Irritable bowel syndrome without diarrhea: Secondary | ICD-10-CM | POA: Diagnosis not present

## 2020-12-06 DIAGNOSIS — K5909 Other constipation: Secondary | ICD-10-CM | POA: Diagnosis not present

## 2020-12-06 DIAGNOSIS — K219 Gastro-esophageal reflux disease without esophagitis: Secondary | ICD-10-CM | POA: Diagnosis not present

## 2020-12-06 DIAGNOSIS — R140 Abdominal distension (gaseous): Secondary | ICD-10-CM | POA: Diagnosis not present

## 2020-12-08 DIAGNOSIS — F3181 Bipolar II disorder: Secondary | ICD-10-CM | POA: Diagnosis not present

## 2020-12-15 ENCOUNTER — Ambulatory Visit (INDEPENDENT_AMBULATORY_CARE_PROVIDER_SITE_OTHER): Payer: Medicare Other | Admitting: Cardiology

## 2020-12-15 ENCOUNTER — Encounter: Payer: Self-pay | Admitting: Cardiology

## 2020-12-15 ENCOUNTER — Other Ambulatory Visit: Payer: Self-pay

## 2020-12-15 DIAGNOSIS — F319 Bipolar disorder, unspecified: Secondary | ICD-10-CM | POA: Diagnosis not present

## 2020-12-15 DIAGNOSIS — E7800 Pure hypercholesterolemia, unspecified: Secondary | ICD-10-CM

## 2020-12-15 DIAGNOSIS — I10 Essential (primary) hypertension: Secondary | ICD-10-CM | POA: Diagnosis not present

## 2020-12-15 DIAGNOSIS — E139 Other specified diabetes mellitus without complications: Secondary | ICD-10-CM

## 2020-12-15 DIAGNOSIS — R55 Syncope and collapse: Secondary | ICD-10-CM | POA: Diagnosis not present

## 2020-12-15 NOTE — Patient Instructions (Signed)
Medication Instructions:  °Your physician recommends that you continue on your current medications as directed. Please refer to the Current Medication list given to you today. ° °*If you need a refill on your cardiac medications before your next appointment, please call your pharmacy* ° ° °Lab Work: °None °If you have labs (blood work) drawn today and your tests are completely normal, you will receive your results only by: °MyChart Message (if you have MyChart) OR °A paper copy in the mail °If you have any lab test that is abnormal or we need to change your treatment, we will call you to review the results. ° ° °Testing/Procedures: °Your physician has requested that you have an echocardiogram. Echocardiography is a painless test that uses sound waves to create images of your heart. It provides your doctor with information about the size and shape of your heart and how well your heart’s chambers and valves are working. This procedure takes approximately one hour. There are no restrictions for this procedure. ° ° ° °Follow-Up: °At [facility], you and your health needs are our priority.  As part of our continuing mission to provide you with exceptional heart care, we have created designated Provider Care Teams.  These Care Teams include your primary Cardiologist (physician) and Advanced Practice Providers (APPs -  Physician Assistants and Nurse Practitioners) who all work together to provide you with the care you need, when you need it. ° °We recommend signing up for the patient portal called "MyChart".  Sign up information is provided on this After Visit Summary.  MyChart is used to connect with patients for Virtual Visits (Telemedicine).  Patients are able to view lab/test results, encounter notes, upcoming appointments, etc.  Non-urgent messages can be sent to your provider as well.   °To learn more about what you can do with MyChart, go to [email or web address].   ° °Your next appointment:   °Follow Up- As  Needed ° °Other Instructions ° ° ° °

## 2020-12-15 NOTE — Assessment & Plan Note (Signed)
Followed by endocrinology.  Medications reviewed.  Neuropathy noted.  Gabapentin not taking

## 2020-12-15 NOTE — Assessment & Plan Note (Signed)
Previously has been on pravastatin 20 mg a day in the past.

## 2020-12-15 NOTE — Assessment & Plan Note (Signed)
On losartan 25 mg a day amlodipine 5 mg a day

## 2020-12-15 NOTE — Assessment & Plan Note (Signed)
Has felt sick for several years.  Challenging to control.  Wishes she could have 1 month without seeing the doctor without having an upset stomach.  Lives with her husband but challenging relationship.

## 2020-12-15 NOTE — Progress Notes (Signed)
Cardiology Office Note:    Date:  12/15/2020   ID:  [name], DOB 1956/01/01, MRN [id number]  PCP:  Elfredia Nevins, MD   [facility] Providers Cardiologist:  None     Referring MD: Elfredia Nevins, MD    History of Present Illness:    [name] is a 65 y.o. female here for follow-up of syncope.  Prior episodes of syncope might of been related to hypotension and hypoglycemia.  Thankfully back in 2021 when reviewing prior cardiology note she was not having any further symptoms.  Heart rate was in the 90s at the time.  Thyroid function previously was normal.  It looks like she has not had an echocardiogram.  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    Past Medical History:  Diagnosis Date   Anxiety    Bipolar 1 disorder (HCC)    Breast density 06/25/2012   Right breast density, will get mammogram and [place]   Depression    Diabetes mellitus without complication (HCC)    type 1   Duodenitis    GERD (gastroesophageal reflux disease)    History of kidney stones    HTN (hypertension)    Hyperlipidemia    IBS (irritable bowel syndrome)    Neuropathy    Sepsis (HCC) 02/27/2019    Past Surgical History:  Procedure Laterality Date   ABDOMINAL HYSTERECTOMY     partial   CHOLECYSTECTOMY     COLONOSCOPY     CYSTOSCOPY WITH RETROGRADE PYELOGRAM, URETEROSCOPY AND STENT PLACEMENT Right 03/31/2019   Procedure: CYSTOSCOPY WITH RETROGRADE PYELOGRAM, URETEROSCOPY AND STENT PLACEMENT;  Surgeon: Noel Christmas, MD;  Location: [facility];  Service: Urology;  Laterality: Right;   CYSTOSCOPY WITH STENT PLACEMENT Right 03/02/2019   Procedure: CYSTOSCOPY WITH STENT PLACEMENT;  Surgeon: Noel Christmas, MD;  Location: [facility]  OR;  Service: Urology;  Laterality: Right;   HOLMIUM LASER APPLICATION Right 03/31/2019   Procedure: HOLMIUM LASER APPLICATION;  Surgeon: Noel Christmas, MD;  Location: [facility];  Service: Urology;  Laterality: Right;   UPPER GASTROINTESTINAL ENDOSCOPY      Current Medications: Current Meds  Medication Sig   acetaminophen (TYLENOL) 500 MG tablet Take 1,000 mg by mouth every 6 (six) hours as needed for mild pain or headache.   ALPRAZolam (XANAX) 1 MG tablet Take 1 mg by mouth 4 (four) times daily as needed for anxiety.    AMBULATORY NON FORMULARY MEDICATION Squatty Potty x 1   amLODipine (NORVASC) 5 MG tablet Take 5 mg by mouth daily.   BD PEN NEEDLE NANO U/F 32G X 4 MM MISC USE WITH INSULIN PEN FOUR TIMES A DAY   busPIRone (BUSPAR) 10 MG tablet Take 10 mg by mouth in the morning and at bedtime.   cholecalciferol (VITAMIN D3) 25 MCG (1000 UNIT) tablet Take 1,000 Units by mouth daily.   Docusate Sodium (COLACE PO) Take 100 mg by mouth daily.   esomeprazole (NEXIUM) 40 MG capsule Take 40 mg by mouth 2 (two) times daily.   Glucagon (BAQSIMI ONE PACK) 3 MG/DOSE POWD Use 1 spray in the nose as needed for hypoglycemia   glucose blood (FREESTYLE LITE) test strip USE TO CHECK BLOOD SUGAR 6 TIMES PER DAY   insulin aspart (  NOVOLOG FLEXPEN) 100 UNIT/ML FlexPen INJECT 8 TO 10 UNITS UNDER THE SKIN AT THE START OF THE THREE MAIN MEALS AS DIRECTED   insulin glargine (LANTUS SOLOSTAR) 100 UNIT/ML Solostar Pen INJECT 45 units daily under skin as advised   Insulin Syringes, Disposable, U-100 1 ML MISC To use for insulin injection..   Lancets (FREESTYLE) lancets USE TO TEST BLOOD SUGAR 4 TO 6 TIMES DAILY AS INSTRUCTED   losartan (COZAAR) 25 MG tablet Take 25 mg by mouth daily.   metFORMIN (GLUCOPHAGE) 500 MG tablet TAKE 1 TABLET TWICE A DAY WITH MEALS   nortriptyline (PAMELOR) 50 MG capsule Take 50 mg by mouth at bedtime.    ondansetron (ZOFRAN-ODT) 4 MG disintegrating tablet PLACE 1 TO 2  TABLETS ON TONGUE EVERY 4 TO 6 HOURS AS NEEDED FOR NAUSEA (Patient taking differently: Take 4-8 mg by mouth every 4 (four) hours as needed for nausea or vomiting.)   OSCIMIN 0.125 MG SUBL DISSOLVE 1 TABLET UNDER THE TONGUE EVERY 4 HOURS AS NEEDED (NEED APPOINTMENT)   Probiotic CAPS Take 1 capsule by mouth daily.   Propylene Glycol (SYSTANE BALANCE OP) Place 1 drop into both eyes daily as needed (dry eyes).   rosuvastatin (CRESTOR) 10 MG tablet Take 10 mg by mouth daily.   traZODone (DESYREL) 100 MG tablet 1  qhs  May increse to 2  qhs  After 1 week (Patient taking differently: Take 100 mg by mouth at bedtime. 1  qhs  May increse to 2  qhs  After 1 week)   vitamin B-12 (CYANOCOBALAMIN) 1000 MCG tablet Take 1,000 mcg by mouth daily.     Allergies:   Abilify [aripiprazole], Phenergan [promethazine], and Reglan [metoclopramide]   Social History   Socioeconomic History   Marital status: Married    Spouse name: phillip   Number of children: 1   Years of education: 12   Highest education level: High school graduate  Occupational History   Occupation: Disabled  Tobacco Use   Smoking status: Never   Smokeless tobacco: Never  Vaping Use   Vaping Use: Some days   Substances: Flavoring  Substance and Sexual Activity   Alcohol use: No    Alcohol/week: 0.0 standard drinks   Drug use: No   Sexual activity: Not Currently  Other Topics Concern   Not on file  Social History Narrative   Lives at home w/ her husband   Left-handed   Caffeine: 1-2 cups of coffee daily   Social Determinants of Health   Financial Resource Strain: Not on file  Food Insecurity: Not on file  Transportation Needs: Not on file  Physical Activity: Not on file  Stress: Not on file  Social Connections: Not on file     Family History: The patient's family history includes Bipolar disorder in her daughter, maternal aunt, mother, and sister; Cancer in her father and mother; Depression in her father; Heart disease in  her brother; Hypertension in her mother and sister; Thyroid disease in her sister. There is no history of Colon cancer, Colon polyps, Esophageal cancer, Kidney disease, Gallbladder disease, Diabetes, or Dementia.  ROS:   Please see the history of present illness.     All other systems reviewed and are negative.  EKGs/Labs/Other Studies Reviewed:    The following studies were reviewed today:  Cardiac event monitor 11/04/2018: Sinus rhythm and sinus tachycardia. Episode of syncope associated with HR 103 bpm. No arrhythmias.   Recent Labs: 03/03/2020: TSH 0.533 03/04/2020: ALT 16 03/06/2020: BUN 6;  Creatinine, Ser 0.71; Hemoglobin 12.6; Magnesium 2.1; Platelets 197; Potassium 4.4; Sodium 139  Recent Lipid Panel    Component Value Date/Time   CHOL 178 10/17/2020 1117   TRIG 165.0 (H) 10/17/2020 1117   HDL 45.70 10/17/2020 1117   CHOLHDL 4 10/17/2020 1117   VLDL 33.0 10/17/2020 1117   LDLCALC 100 (H) 10/17/2020 1117     Risk Assessment/Calculations:              Physical Exam:    VS:  BP 140/86    Pulse (!) 104    Ht 5\' 4"  (1.626 m)    Wt 175 lb (79.4 kg)    SpO2 98%    BMI 30.04 kg/m     Wt Readings from Last 3 Encounters:  12/15/20 175 lb (79.4 kg)  10/17/20 177 lb 9.6 oz (80.6 kg)  07/11/20 178 lb 6.4 oz (80.9 kg)     GEN:  Well nourished, well developed in no acute distress HEENT: Normal NECK: No JVD; No carotid bruits LYMPHATICS: No lymphadenopathy CARDIAC: RRR, no murmurs, no rubs, gallops RESPIRATORY:  Clear to auscultation without rales, wheezing or rhonchi  ABDOMEN: Soft, non-tender, non-distended MUSCULOSKELETAL:  No edema; No deformity  SKIN: Warm and dry NEUROLOGIC:  Alert and oriented x 3 PSYCHIATRIC: Anxious affect   ASSESSMENT:    1. Syncope and collapse   2. Essential hypertension   3. Pure hypercholesterolemia   4. LADA (latent autoimmune diabetes in adults), managed as type 1 (HCC)   5. Bipolar 1 disorder (HCC)    PLAN:    In order of  problems listed above:  Syncope and collapse Remote history of syncope.  Thankfully, no recurrence.  Continue with adequate hydration.  Excellent monitor in the past.  Prior episode of syncope associate with heart rate of 103 bpm.  No arrhythmias.  I will check an echocardiogram to complete evaluation and make sure the structure and function is normal.  Otherwise, no further cardiac work-up necessary.  Essential hypertension On losartan 25 mg a day amlodipine 5 mg a day  Pure hypercholesterolemia Previously has been on pravastatin 20 mg a day in the past.  LADA (latent autoimmune diabetes in adults), managed as type 1 (HCC) Followed by endocrinology.  Medications reviewed.  Neuropathy noted.  Gabapentin not taking  Bipolar 1 disorder (HCC) Has felt sick for several years.  Challenging to control.  Wishes she could have 1 month without seeing the doctor without having an upset stomach.  Lives with her husband but challenging relationship.         Medication Adjustments/Labs and Tests Ordered: Current medicines are reviewed at length with the patient today.  Concerns regarding medicines are outlined above.  Orders Placed This Encounter  Procedures   ECHOCARDIOGRAM COMPLETE    No orders of the defined types were placed in this encounter.   Patient Instructions  Medication Instructions:  Your physician recommends that you continue on your current medications as directed. Please refer to the Current Medication list given to you today.  *If you need a refill on your cardiac medications before your next appointment, please call your pharmacy*   Lab Work: None If you have labs (blood work) drawn today and your tests are completely normal, you will receive your results only by: MyChart Message (if you have MyChart) OR A paper copy in the mail If you have any lab test that is abnormal or we need to change your treatment, we will call you to review the  results.   Testing/Procedures: Your physician has requested that you have an echocardiogram. Echocardiography is a painless test that uses sound waves to create images of your heart. It provides your doctor with information about the size and shape of your heart and how well your hearts chambers and valves are working. This procedure takes approximately one hour. There are no restrictions for this procedure.    Follow-Up: At [facility], you and your health needs are our priority.  As part of our continuing mission to provide you with exceptional heart care, we have created designated Provider Care Teams.  These Care Teams include your primary Cardiologist (physician) and Advanced Practice Providers (APPs -  Physician Assistants and Nurse Practitioners) who all work together to provide you with the care you need, when you need it.  We recommend signing up for the patient portal called "MyChart".  Sign up information is provided on this After Visit Summary.  MyChart is used to connect with patients for Virtual Visits (Telemedicine).  Patients are able to view lab/test results, encounter notes, upcoming appointments, etc.  Non-urgent messages can be sent to your provider as well.   To learn more about what you can do with MyChart, go to [email or web address].    Your next appointment:   Follow Up: As Needed   Other Instructions     Signed, Donato Schultz, MD  12/15/2020 4:32 PM    [facility]

## 2020-12-15 NOTE — Assessment & Plan Note (Addendum)
Remote history of syncope.  Thankfully, no recurrence.  Continue with adequate hydration.  Excellent monitor in the past.  Prior episode of syncope associate with heart rate of 103 bpm.  No arrhythmias.  I will check an echocardiogram to complete evaluation and make sure the structure and function is normal.  Otherwise, no further cardiac work-up necessary.

## 2020-12-20 ENCOUNTER — Ambulatory Visit (INDEPENDENT_AMBULATORY_CARE_PROVIDER_SITE_OTHER): Payer: TRICARE For Life (TFL) | Admitting: Gastroenterology

## 2020-12-22 DIAGNOSIS — F3181 Bipolar II disorder: Secondary | ICD-10-CM | POA: Diagnosis not present

## 2021-01-03 ENCOUNTER — Telehealth: Payer: Self-pay | Admitting: Internal Medicine

## 2021-01-03 NOTE — Telephone Encounter (Signed)
Unfortunately, this is not new for her, she has had blood sugar fluctuating between these 2 numbers for a long time.  She needs to look at what happened before these 2 incidents (meal, activity, insulin) and let us know if these appear to happen in a certain pattern (for example low blood sugars after dinner, high blood sugars in the morning, etc.).

## 2021-01-03 NOTE — Telephone Encounter (Signed)
01/02/21 Morning BS: 157 Pt took 10 units of Novolog Ate: cereal and toast Lunch BS: 148 Pt took 10 units of Novolog Ate: sandwich Dinner BS 248 Pt took 12 units of Novolog Ate: Noodles and spring roll Pt normally takes 30 units of Lantus at bedtime and takes 15 additional units at 3:00 AM. Last night her BS dropped to the 40's and she did not take her normal 30 units at bedtime instead she waiting until 1:45 AM when her BS was over 120 to take her 30 units of Lantus. She did not take her additional 15 units of Lantus at 3:00 AM. Pt's BS this morning may reflect her missing 15 units. 01/03/21 BS: 276  Pt took 13 units of Novolog with BS 309  Ate: Cereal Pt took 12 units of Novolog with BS 227 Ate: Oatmeal. She advised she has been sick for about a month and has not had much energy to do basic errands like grocery shopping. Wanted to know if feeling under the weather can affect blood sugars.

## 2021-01-03 NOTE — Telephone Encounter (Signed)
Patients requests to be called at ph# [phone] (not the phone # that is listed in Patient's demographics) re: Patient states her blood sugars having running both low (lowest 01/02/21 at 9 pm = 49), Patient took Lantus on 01/03/21 at 1:45 AM=123. This morning 01/03/21 at 6:30 am blood sugars were 276. At 7:30 am on 01/03/21 Patient ate cereal and blood sugars then went to 303 and have continued to get higher. Patient requests to be called asap to advised as to what she should do.

## 2021-01-03 NOTE — Telephone Encounter (Signed)
I agree that missing the extra Lantus may have affected the sugars. Would continue to monitor for now.

## 2021-01-10 DIAGNOSIS — F3181 Bipolar II disorder: Secondary | ICD-10-CM | POA: Diagnosis not present

## 2021-01-24 ENCOUNTER — Ambulatory Visit (INDEPENDENT_AMBULATORY_CARE_PROVIDER_SITE_OTHER): Payer: Medicare Other | Admitting: Internal Medicine

## 2021-01-24 ENCOUNTER — Encounter: Payer: Self-pay | Admitting: Internal Medicine

## 2021-01-24 ENCOUNTER — Other Ambulatory Visit: Payer: Self-pay

## 2021-01-24 VITALS — BP 128/80 | HR 102 | Ht 64.0 in | Wt 176.2 lb

## 2021-01-24 DIAGNOSIS — E785 Hyperlipidemia, unspecified: Secondary | ICD-10-CM | POA: Diagnosis not present

## 2021-01-24 DIAGNOSIS — G63 Polyneuropathy in diseases classified elsewhere: Secondary | ICD-10-CM | POA: Diagnosis not present

## 2021-01-24 DIAGNOSIS — E139 Other specified diabetes mellitus without complications: Secondary | ICD-10-CM

## 2021-01-24 NOTE — Patient Instructions (Signed)
Please continue: - Metformin 500 mg 2x a day with meals - Lantus 15 units in am (at 2 am) and 30 units at bedtime - NovoLog:  8-10 units in am 8-10 units with lunch 6-8 units with dinner  If your sugars are <100 and you eat a low carb meal, inject only ~6 units. - Novolog Sliding scale: 201-250: + 2 units 251-300: + 3 units 301-350: + 4 units >350: + 5 units

## 2021-01-24 NOTE — Progress Notes (Addendum)
Patient ID: [name], female   DOB: 08/13/1955, 66 y.o.   MRN: [id number]  This visit occurred during the SARS-CoV-2 public health emergency.  Safety protocols were in place, including screening questions prior to the visit, additional usage of staff PPE, and extensive cleaning of exam room while observing appropriate contact time as indicated for disinfecting solutions.   HPI: Sabrina ComptonBarbara L Yuan is a 66 y.o.-year-old female, initially referred by her PCP, Dr. Assunta FoundJohn Golding (PA: Lenise HeraldBenjamin Mann), returning for f/u for LADA, dx 2005, insulin-dependent since ~2006, controlled, with complications (PN, gastroparesis?).  Last visit 3 months ago.  Interim history: She has bipolar disease  and also has short-term memory loss and disequilibrium.  She sees neurology. She continues to have nausea and increased urination, but no blurry vision, chest pain.  Reviewed history: She was on an Omnipod insulin pump on 08/01/2015. She had severe anxiety and depression and got so overwhelmed with the insulin pump that we had to take her off the pump.  We switched her to a basal-bolus insulin regimen, but she was unable to calculate the insulin doses based on insulin to carb ratio, so now she is using fixed rapid acting insulin doses.  Reviewed HbA1c levels: 10/17/2020: HbA1c calculated from fructosamine is 7.25%. 07/11/2020: HbA1c calculated from fructosamine is 7.3%, slightly higher than before. 04/21/2020: HbA1c calculated from fructosamine has improved to 7.1%. Lab Results  Component Value Date   HGBA1C 5.0 03/03/2020   HGBA1C 5.8 (A) 11/09/2019   HGBA1C 5.7 (A) 06/30/2019  03/04/2018:  Hba1c calculated from fructosamine is the best in a long time, at 6.84%! 12/19/2017: HbA1c calculated from fructosamine has improved to 7.1%! 09/12/2017: HbA1c  Calculated from the fructosamine is 7.5%, improved from before. 05/07/2017: HbA1c calculated from the fructosamine was 7.7% 02/07/2017: HbA1c calculated from the  fructosamine was higher, at 7.7%. 11/07/2016: HbA1c calc. from the fructosamine is a little lower, at 7.25% 08/07/2016: HbA1c calculated from the fructosamine is 7.5% 05/31/2016: HbA1c calculated from fructosamine is better, at 7.25% 12/15/2015: HbA1c calculated from the fructosamine is higher, 8.4%. 08/24/2015:  HbA1c calculated from fructosamine is 7.4%. 05/24/2015: HbA1c calculated from fructosamine is 6.9%.  In 01/2019, she had a professional CGM attached: [employer] professional CGM traces between 12/31/2018 at 01/11/2019 were analyzed and the reports will be scanned.  Patient's sugars are variable but consistent patterns were noticed: Sugars are low in the second half of the night and morning, but they increase significantly after breakfast.  Around lunchtime she starts to drop her sugars and they increase but in the less significant fashion after dinner.  Patient was advised to: - reduce the dose of Lantus to 30 units for now.  - If the sugars after breakfast remain high after reducing the Lantus,  increase the dose of rapid acting insulin with breakfast by 2 units. - inject approximately 15 minutes before the meal to see if this helps reducing the sugars after breakfast. -  One of her concerns was about what to do if she is not hungry at lunch.  Reducing the Lantus will help her not drop even if she is not eating, however, if she does feel like she is dropping she may need glucose tablets, 2-4 to keep her sugars up.  She is on: - Metformin 1000 mg with dinner >> 500 mg 2x a day - Lantus  30-34 >> 36 >> 30 units at bedtime and 15 units 4h later (2 am) >> 15 units at 3 a.m. and 30 units at  bedtime - NovoLog:  10-12 >> 8-10 units in am 10-12 >> 8-10 units with lunch 10-12 >> 8-10 >> 6-8 units with dinner - Novolog Sliding scale: 201-250: + 2 units 251-300: + 3 units 301-350: + 4 units >350: + 5 units  Pt checks her sugars ~2-4 times a day per review of her log: - am:  71, 74,  130-228, 253, 328 >> 76, 95-219, 236 >> 79-216, 234, 285 - 2h after b'fast: 58, 111 >> n/c >> 149, 229 >> 177, 208, 385 >> 54, 274 >> n/c - before lunch:  45, 54, 72, 75-210, 301, 314 >> 51-170, 222 >> 62-206, 215, 27 - 2h after lunch: 43 >> n/c >> 45-178, 209 >> 126-162, 354 >> n/c >> 138 - before dinner: n/c >> 40-298 >> 65-270 >> 63-187, 204 >> 54, 67-225, 248, 288 - 2h after dinner: 45 >> n/c >> 77-210 >> 265 >> 30, 43-77, 235 >> 84-125, 358 - bedtime: 80-285, 311, 340 >>  37,41-307, 319, 349 >> 53, 60-231, 231, 311 - at night: 51, 88, 134 >> n/c >> 52, 65 >> 83, 92, 383  >> 34, 45, 79, 124 >> 104-193 Lowest sugar was 32 >>... 40 >> 53; she has hypoglycemia awareness in the 50s Highest sugar was 351 >> 383 >> 358.  No CKD, last BUN/creatinine:  Lab Results  Component Value Date   BUN 6 (L) 03/06/2020   CREATININE 0.71 03/06/2020   No microalbuminuria: Lab Results  Component Value Date   MICRALBCREAT 5.4 11/07/2016    + Dyslipidemia: Lab Results  Component Value Date   CHOL 178 10/17/2020   HDL 45.70 10/17/2020   LDLCALC 100 (H) 10/17/2020   TRIG 165.0 (H) 10/17/2020   CHOLHDL 4 10/17/2020  Not on a statin.  -Last eye exam was on 08/2020: No DR reportedly -[facility]: Dr. Janne LabMark Cottle.  -+ Numbness and tingling in her feet.  Also, occasional pain.  She was on Neurontin, but now only on nortriptyline.  These are refilled by PCP.  She is also on alpha-lipoic acid She sees a podiatrist [facility]).  She had 2 toenails removed in the past due to fungal infection.  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  She has a diagnosis of Parkinson's disease.  She also has bipolar disease. Also: GERD, HTN, fibromyalgia.  Latest TSH was normal: Lab Results  Component Value Date   TSH 0.533 03/03/2020   ROS: + See HPI Neurological: no tremors/+ numbness/+ tingling/no dizziness, + disequilibrium  I reviewed pt's medications, allergies, PMH, social hx, family hx,  and changes were documented in the history of present illness. Otherwise, unchanged from my initial visit note.  Past Medical History:  Diagnosis Date   Anxiety    Bipolar 1 disorder (HCC)    Breast density 06/25/2012   Right breast density, will get mammogram and US   Depression    Diabetes mellitus without complication (HCC)    type 1   Duodenitis    GERD (gastroesophageal reflux disease)    History of kidney stones    HTN (hypertension)    Hyperlipidemia    IBS (irritable bowel syndrome)    Neuropathy    Sepsis (HCC) 02/27/2019   Past Surgical History:  Procedure Laterality Date   ABDOMINAL HYSTERECTOMY     partial   CHOLECYSTECTOMY     COLONOSCOPY     CYSTOSCOPY WITH RETROGRADE PYELOGRAM, URETEROSCOPY AND STENT PLACEMENT Right 03/31/2019   Procedure: CYSTOSCOPY WITH  RETROGRADE PYELOGRAM, URETEROSCOPY AND STENT PLACEMENT;  Surgeon: Noel Christmas, MD;  Location: [facility];  Service: Urology;  Laterality: Right;   CYSTOSCOPY WITH STENT PLACEMENT Right 03/02/2019   Procedure: CYSTOSCOPY WITH STENT PLACEMENT;  Surgeon: Noel Christmas, MD;  Location: [facility] OR;  Service: Urology;  Laterality: Right;   HOLMIUM LASER APPLICATION Right 03/31/2019   Procedure: HOLMIUM LASER APPLICATION;  Surgeon: Noel Christmas, MD;  Location: [facility];  Service: Urology;  Laterality: Right;   UPPER GASTROINTESTINAL ENDOSCOPY     Social History   Socioeconomic History   Marital status: Married    Spouse name: phillip   Number of children: 1   Years of education: 12   Highest education level: High school graduate  Occupational History   Occupation: Disabled  Tobacco Use   Smoking status: Never   Smokeless tobacco: Never  Vaping Use   Vaping Use: Some days   Substances: Flavoring  Substance and Sexual Activity   Alcohol use: No    Alcohol/week: 0.0 standard drinks   Drug use: No   Sexual activity: Not Currently  Other Topics Concern   Not on file   Social History Narrative   Lives at home w/ her husband   Left-handed   Caffeine: 1-2 cups of coffee daily   Social Determinants of Health   Financial Resource Strain: Not on file  Food Insecurity: Not on file  Transportation Needs: Not on file  Physical Activity: Not on file  Stress: Not on file  Social Connections: Not on file  Intimate Partner Violence: Not on file   Current Outpatient Medications on File Prior to Visit  Medication Sig Dispense Refill   acetaminophen (TYLENOL) 500 MG tablet Take 1,000 mg by mouth every 6 (six) hours as needed for mild pain or headache.     ALPRAZolam (XANAX) 1 MG tablet Take 1 mg by mouth 4 (four) times daily as needed for anxiety.      AMBULATORY NON FORMULARY MEDICATION Squatty Potty x 1 1 each 0   amLODipine (NORVASC) 5 MG tablet Take 5 mg by mouth daily.     BD PEN NEEDLE NANO U/F 32G X 4 MM MISC USE WITH INSULIN PEN FOUR TIMES A DAY 400 each 3   BENICAR 40 MG tablet Take 40 mg by mouth daily.  (Patient not taking: Reported on 12/15/2020)     busPIRone (BUSPAR) 10 MG tablet Take 10 mg by mouth in the morning and at bedtime.     cholecalciferol (VITAMIN D3) 25 MCG (1000 UNIT) tablet Take 1,000 Units by mouth daily.     Docusate Sodium (COLACE PO) Take 100 mg by mouth daily.     esomeprazole (NEXIUM) 40 MG capsule Take 40 mg by mouth 2 (two) times daily.     gabapentin (NEURONTIN) 800 MG tablet Take 800 mg by mouth 3 (three) times daily.  (Patient not taking: Reported on 12/15/2020)     Glucagon (BAQSIMI ONE PACK) 3 MG/DOSE POWD Use 1 spray in the nose as needed for hypoglycemia 1 each PRN   glucose blood (FREESTYLE LITE) test strip USE TO CHECK BLOOD SUGAR 6 TIMES PER DAY 600 each 3   insulin aspart (NOVOLOG FLEXPEN) 100 UNIT/ML FlexPen INJECT 8 TO 10 UNITS UNDER THE SKIN AT THE START OF THE THREE MAIN MEALS AS DIRECTED 30 mL 3   insulin glargine (LANTUS SOLOSTAR) 100 UNIT/ML Solostar Pen INJECT 45 units daily under skin as advised 45 mL 2  Insulin Syringes, Disposable, U-100 1 ML MISC To use for insulin injection.. 100 each 1   Lancets (FREESTYLE) lancets USE TO TEST BLOOD SUGAR 4 TO 6 TIMES DAILY AS INSTRUCTED 300 each 6   losartan (COZAAR) 25 MG tablet Take 25 mg by mouth daily.     metFORMIN (GLUCOPHAGE) 500 MG tablet TAKE 1 TABLET TWICE A DAY WITH MEALS 180 tablet 3   nortriptyline (PAMELOR) 50 MG capsule Take 50 mg by mouth at bedtime.      ondansetron (ZOFRAN-ODT) 4 MG disintegrating tablet PLACE 1 TO 2 TABLETS ON TONGUE EVERY 4 TO 6 HOURS AS NEEDED FOR NAUSEA (Patient taking differently: Take 4-8 mg by mouth every 4 (four) hours as needed for nausea or vomiting.) 180 tablet 4   OSCIMIN 0.125 MG SUBL DISSOLVE 1 TABLET UNDER THE TONGUE EVERY 4 HOURS AS NEEDED (NEED APPOINTMENT) 360 tablet 0   Probiotic CAPS Take 1 capsule by mouth daily. 30 capsule 1   Propylene Glycol (SYSTANE BALANCE OP) Place 1 drop into both eyes daily as needed (dry eyes).     rosuvastatin (CRESTOR) 10 MG tablet Take 10 mg by mouth daily.     traZODone (DESYREL) 100 MG tablet 1  qhs  May increse to 2  qhs  After 1 week (Patient taking differently: Take 100 mg by mouth at bedtime. 1  qhs  May increse to 2  qhs  After 1 week) 180 tablet 1   venlafaxine (EFFEXOR) 37.5 MG tablet Take 37.5 mg by mouth daily. (Patient not taking: Reported on 12/15/2020)     vitamin B-12 (CYANOCOBALAMIN) 1000 MCG tablet Take 1,000 mcg by mouth daily.     No current facility-administered medications on file prior to visit.   Allergies  Allergen Reactions   Abilify [Aripiprazole]     Patient is intolerant   Phenergan [Promethazine] Diarrhea and Nausea And Vomiting   Reglan [Metoclopramide] Other (See Comments)    Chest pains   Family History  Problem Relation Age of Onset   Hypertension Mother    Bipolar disorder Mother    Cancer Mother    Heart disease Brother    Thyroid disease Sister    Hypertension Sister    Bipolar disorder Sister    Depression Father    Cancer  Father    Bipolar disorder Daughter    Bipolar disorder Maternal Aunt    Colon cancer Neg Hx    Colon polyps Neg Hx    Esophageal cancer Neg Hx    Kidney disease Neg Hx    Gallbladder disease Neg Hx    Diabetes Neg Hx    Dementia Neg Hx     PE: BP 128/80 (BP Location: Right Arm, Patient Position: Sitting, Cuff Size: Normal)    Pulse (!) 102    Ht 5\' 4"  (1.626 m)    Wt 176 lb 3.2 oz (79.9 kg)    SpO2 97%    BMI 30.24 kg/m    Wt Readings from Last 3 Encounters:  01/24/21 176 lb 3.2 oz (79.9 kg)  12/15/20 175 lb (79.4 kg)  10/17/20 177 lb 9.6 oz (80.6 kg)   Constitutional: slightly overweight, in NAD Eyes: PERRLA, EOMI, no exophthalmos ENT: moist mucous membranes, no thyromegaly, no cervical lymphadenopathy Cardiovascular: Tachycardia, RR, No MRG Respiratory: CTA B Musculoskeletal: no deformities, strength intact in all 4 Skin: moist, warm, + many tatoos Neurological: no tremor with outstretched hands, DTR normal in all 4  ASSESSMENT: 1. 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  Component     Latest Ref Rng 07/16/2013  C-Peptide     0.80 - 3.90 ng/mL 1.29  Glucose     70 - 99 mg/dL 161 (H)  Glutamic Acid Decarb Ab     <=1.0 U/mL 11.3 (H)  Pancreatic Islet Cell Antibody     <5 JDF Units 5 (A)  + anti-pancreatic antibodies >> LADA rather than type 2 DM. She still has a positive C peptide >> still has insulin secretion.  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 HL  PLAN:  1.  Patient with poorly controlled LADA, difficult to manage due to depression/anxiety/GI problems.  Blood sugars usually fluctuate between 30s and 300s.  In the past, she was on insulin pump but she could not continue to use it due to anxiety.  She also had a professional CGM placed but did not want to start the CGM afterwards.  At last visit, due to the variability of her blood sugars,  I again suggested to think about using the CGM and gave her a list of suppliers.  Also, at last visit, we discussed about sick day rules and sent the prescription for Baqsimi to her pharmacy.  I advised her to decrease NovoLog before dinner as she occasionally had low blood sugars after this meal. - at today's visit, we reviewed together her detailed blood sugar log and it appears that her sugars are fluctuating, but with less lows compared to before.  In the last month, she had only 1 blood sugar at 53, the rest of them have been above 60s. Also, she had only few sugars in the 300s.  Overall, sugars appear to be better controlled in the last month, compared to before, so therefore, even though they are not all at target, I did not suggest a change in regimen.  We discussed again about possibly starting a CGM, but she is worried about cost and would not want to try it for now. - I suggested to:  Patient Instructions  Please continue: - Metformin 500 mg 2x a day with meals - Lantus 15 units in am (at 2 am) and 30 units at bedtime - NovoLog:  8-10 units in am 8-10 units with lunch 6-8 units with dinner  If your sugars are <100 and you eat a low carb meal, inject only ~6 units. - Novolog Sliding scale: 201-250: + 2 units 251-300: + 3 units 301-350: + 4 units >350: + 5 units  - we we will check a fructosamine level today - advised to check sugars at different times of the day - 4-6x a day, rotating check times - advised for yearly eye exams >> she is UTD - return to clinic in 3 months  2. PN  -Related to diabetes -She continues on nortriptyline; she was also on gabapentin 800 mg 3 times a day >> weaned off -We discussed in the past about using alpha-lipoic acid 600 mg twice a day >> continues on this; also on Nervine Roll On -In summer 2021, I referred her to physical therapy at [facility] office, upon her request, since she had this before with good results.  At previous visits I  gave her again the telephone numbers.  She still did not contact them  3. HL -Reviewed latest lipid panel available from 02/2019: Fractions at goal with exception of a low HDL: Lab Results  Component Value Date  CHOL 178 10/17/2020   HDL 45.70 10/17/2020   LDLCALC 100 (H) 10/17/2020   TRIG 165.0 (H) 10/17/2020   CHOLHDL 4 10/17/2020  -She is is on Crestor 10 mg daily  Component     Latest Ref Rng & Units 01/24/2021  Fructosamine     205 - 285 umol/L 311 (H)  HbA1c calculated from fructosamine is 6.9%.  Carlus Pavlov, MD PhD [facility] Endocrinology

## 2021-01-26 ENCOUNTER — Other Ambulatory Visit: Payer: Self-pay

## 2021-01-26 ENCOUNTER — Ambulatory Visit (HOSPITAL_COMMUNITY)
Admission: RE | Admit: 2021-01-26 | Discharge: 2021-01-26 | Disposition: A | Discharge status: Home / Self Care | Payer: Medicare Other | Source: Ambulatory Visit | Attending: Cardiology | Admitting: Cardiology

## 2021-01-26 DIAGNOSIS — F3181 Bipolar II disorder: Secondary | ICD-10-CM | POA: Diagnosis not present

## 2021-01-26 DIAGNOSIS — R55 Syncope and collapse: Secondary | ICD-10-CM | POA: Insufficient documentation

## 2021-01-26 LAB — ECHOCARDIOGRAM COMPLETE
Area-P 1/2: 3.17 cm2
S' Lateral: 3.1 cm

## 2021-01-26 NOTE — Progress Notes (Signed)
*  PRELIMINARY RESULTS* Echocardiogram 2D Echocardiogram has been performed.  Stacey Drain 01/26/2021, 9:19 AM

## 2021-01-29 LAB — FRUCTOSAMINE: Fructosamine: 311 umol/L — ABNORMAL HIGH (ref 205–285)

## 2021-01-31 ENCOUNTER — Institutional Professional Consult (permissible substitution): Payer: Medicare Other | Admitting: Neurology

## 2021-02-07 DIAGNOSIS — F3181 Bipolar II disorder: Secondary | ICD-10-CM | POA: Diagnosis not present

## 2021-02-21 DIAGNOSIS — E1140 Type 2 diabetes mellitus with diabetic neuropathy, unspecified: Secondary | ICD-10-CM | POA: Diagnosis not present

## 2021-02-21 DIAGNOSIS — I10 Essential (primary) hypertension: Secondary | ICD-10-CM | POA: Diagnosis not present

## 2021-02-21 DIAGNOSIS — J329 Chronic sinusitis, unspecified: Secondary | ICD-10-CM | POA: Diagnosis not present

## 2021-02-21 DIAGNOSIS — K219 Gastro-esophageal reflux disease without esophagitis: Secondary | ICD-10-CM | POA: Diagnosis not present

## 2021-02-23 ENCOUNTER — Encounter: Payer: Self-pay | Admitting: *Deleted

## 2021-02-27 ENCOUNTER — Ambulatory Visit (INDEPENDENT_AMBULATORY_CARE_PROVIDER_SITE_OTHER): Payer: Medicare Other | Admitting: Neurology

## 2021-02-27 ENCOUNTER — Telehealth: Payer: Self-pay | Admitting: Neurology

## 2021-02-27 ENCOUNTER — Encounter: Payer: Self-pay | Admitting: Neurology

## 2021-02-27 VITALS — Ht 64.0 in | Wt 173.0 lb

## 2021-02-27 DIAGNOSIS — R55 Syncope and collapse: Secondary | ICD-10-CM

## 2021-02-27 DIAGNOSIS — R404 Transient alteration of awareness: Secondary | ICD-10-CM | POA: Diagnosis not present

## 2021-02-27 DIAGNOSIS — F3181 Bipolar II disorder: Secondary | ICD-10-CM | POA: Diagnosis not present

## 2021-02-27 DIAGNOSIS — R1310 Dysphagia, unspecified: Secondary | ICD-10-CM

## 2021-02-27 MED ORDER — ASPIRIN EC 81 MG PO TBEC: 81.0000 mg | DELAYED_RELEASE_TABLET | Freq: Every day | ORAL | 11 refills | Status: AC

## 2021-02-27 NOTE — Progress Notes (Signed)
[facility]    Provider:  Dr Jaynee Eagles Requesting Provider: Redmond School, MD Primary Care Provider:  Redmond School, MD  CC:  syncope and muscle spasms  HP 02/27/2021: Patient is a 66 year old female who I saw in 2021 and in and in 2018 for different chief complaint of short-term memory loss, fibromyalgia.  She has a past medical history of neuropathy, hyperlipidemia, hypertension, diabetes, depression, bipolar 1, anxiety, abnormality of gait, muscle weakness, lithium toxicity, suicidal ideation.  Polypharmacy was noted.  I did not believe there was a neurodegenerative disorder, she had some parkinsonian signs likely due to history of dopaminergic medication but an MRI of the brain and the cervical spine was ordered which showed no etiologies for memory loss or any gait abnormalities, recent CT head was also unremarkable.  I reviewed Dr. Nolon Rod notes, she presented with syncope, abdominal cramps, diabetes and sinusitis, episode of driving with syncope, no seizures or residual, unwitnessed.  Of note I see a diagnosis of "Parkinson's disease" on her medication list, I am not aware that she has been diagnosed with that or that I diagnosed her with it, at one of my appointment she had some extraparametal symptoms likely due to her antidopaminergic drugs but she was never diagnosed with Parkinson's disease as far as I know.  I reviewed her blood work which was collected October 2022 which showed CBC with differential unremarkable, CMP showed low glucose at 48, BUN 14, creatinine 0.75 otherwise normal, TSH was normal 1.640.   She will be walking and wake up on the floor x 2. She lost consciousness maybe for a second. She was just walking down the hallway, felt like someone pushed her, not lightheaded, no warning, she remember pain of hitting her head. No urination, no defecation, it happened 6 months ago. And last night, she was walking and started going down and husband tried to catch  her, she says she dd NOT lose consciousness, you got right back up, she says she just dropped, no tripping, no imbalance, she doesn't really know why, but never lost consciousness and afterwards no confusion, no alteration of awareness, she wore a monitor and had an echocardiogram. She has dry mouth, she says that is why she feels her throat is closing, she went to ENT, she saw Candee Furbish and per his notes". Normal pump function, mild increased LV wall thickness. Small pericardial effusion - no clinical consequence." She saw Dr. Marlou Porch in Cardiology as recent as 12/2020 and had evaluation. She denies gettting anything stuck in her throat or coughing, if she takes things with water, she feels the muscles in her neck squeeze but no choking or aspiration.   CT head 07/2020 IMPRESSION: Personally reviewed images and agree with the following 1. No acute intracranial abnormalities. 2. 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 3. No evidence for facial bone fracture.  Patient complains of symptoms per HPI as well as the following symptoms: throat spasms . Pertinent negatives and positives per HPI. All others negative   CC:  Memory loss  HPI 7/021:  [name] is a female here as requested by Redmond School, MD for short term memory loss.  Past medical history neuropathy, hyperlipidemia, hypertension, diabetes, depression, bipolar 1 disorder, anxiety, abnormality of gait, muscle weakness, lithium toxicity, suicidal ideation.  She was seen in neurology over 3 years ago, at that time she denied any issues, I reviewed those notes, she had some memory issues but felt this was age-related due to lots of  medications and bipolar disorder, she reported repeating things, denied getting lost, has been paying the bills and he had to take over 10 years ago after developing bipolar disorder, no alcohol or past drug use other than marijuana, she writes things down a lot and that helps her remember  appointments, she denied problems cooking and no accidents in the home.  At that time her MMSE was 28 out of 30.  Examination was unremarkable.  She was asked to follow-up in several months but never did.  Neurologic examination at that time in 2018 was nonfocal.  It appears she was recently admitted to [facility] in March of this year, discharge diagnosis urosepsis secondary to E. coli bacteremia in the setting of obstructing nephrolithiasis.  She is on multiple medications that can cause memory loss such as desipramine, gabapentin, benzodiazepines, hydrocodone, meclizine, nortriptyline, trazodone per a review of hospital discharge summary.   I also reviewed notes from Dr. Gerarda Fraction, last B12 was 347 May 06, 2019, at the same time white blood cells were normal, sodium was normal, potassium normal, platelets normal, hemoglobin 10.5 slightly low, glucose was elevated 194, folate was normal 16.3, ferritin was low at 7, creatinine 0.68, EGFR 91 mL/min normal, BUN 25, AST/ALT/alk phos normal.  On May 06, 2019, it appears at that time she had a urine infection based on the labs, she reported 6 falls in the prior year, at that time she was on amitriptyline, alprazolam, desipramine, nortriptyline, gabapentin, meclizine, trazodone (and other medication) which are medications that can interfere with memory.  Dr. Rosaura Carpenter goes exam showed a well-appearing well-nourished patient in no distress, normal eyes, neck, heart, lungs, extremities, focused neurologic and for psychiatric evaluation he stated intact memory, judgment and insight, normal mood and affect.  Patient is here with her husband today who also provides much information.  She has short-term memory loss, she had several falls, Dr. Gerarda Fraction was concerned that there may be "pressure on the brain", husband states the patient gets rattled sometimes but the patient states lots of people have short-term memory loss, 6 falls in the past year, today MMSE 21 out of 30 with  10 animals. She states she does have more short-term memory loss, she can't retain many things, but she remember remote details, she denies dementia in her 84 year old mother but states "they were all crazy" and mother was violent at the end. Her father took amitriptyline for many years (not sure why she states this), husband says "he doesn't want me to say a lot". He says her whole family has depression, brothers and sisters and parents extended family with mood disorders and extensive psychiatric disease, she has a tendency to set things down and can't find it, she will spend an hour trying to find it, her brain "doesn't hold everything", patient was confused about why she was here today thought it was for follow up for her kidney infection. Husband has paid all the bills for 10 years, she has impulsive spending, she can go to the grocery and fine paying or at a restaurant, she takes her own medications, she rarely misses her meds (husband agrees), she says she feels angry sometimes, she wants to "talk crap to everyone" but husband says she has been nicer the last few months and she contributes it to colace and resolution of constipation. Husband is more concerned with her aggravation. She has not fallen for 2 months, she fell backwards once when getting out of the car, once she fell  in the hallway, husband says it has been glucose and blood pressure dropping. Husband says once she got tangled in her feet and if she gets up too fast her head with "swim" and she gets vertigo, since getting her blood pressure under control and working on diet she has not fallen.  She states that over the years she has been on multiple anti-psychotics such as abilify and zyprexa. Has not been to PT.   Reviewed notes, labs and imaging from outside physicians, which showed:  In March 2018 HIV, B12, MMA, RPR were all unremarkable.  CT head 2020 showed No acute intracranial abnormalities including mass lesion or mass effect,  hydrocephalus, extra-axial fluid collection, midline shift, hemorrhage, or acute infarction, large ischemic events (personally reviewed images).  Her ventricles were normal, I did not appreciate any hydrocephalus or enlargement of the ventricles.  Review of Systems: Patient complains of symptoms per HPI as well as the following symptoms depression. Pertinent negatives and positives per HPI. All others negative.   Social History   Socioeconomic History   Marital status: Married    Spouse name: phillip   Number of children: 1   Years of education: 12   Highest education level: High school graduate  Occupational History   Occupation: Disabled  Tobacco Use   Smoking status: Never   Smokeless tobacco: Never  Vaping Use   Vaping Use: Some days   Substances: Flavoring  Substance and Sexual Activity   Alcohol use: No    Alcohol/week: 0.0 standard drinks   Drug use: No   Sexual activity: Not Currently  Other Topics Concern   Not on file  Social History Narrative   Lives at home w/ her husband   Left-handed   Caffeine: 1-2 cups of coffee daily   Social Determinants of Health   Financial Resource Strain: Not on file  Food Insecurity: Not on file  Transportation Needs: Not on file  Physical Activity: Not on file  Stress: Not on file  Social Connections: Not on file  Intimate Partner Violence: Not on file    Family History  Problem Relation Age of Onset   Hypertension Mother    Bipolar disorder Mother    Cancer Mother    Depression Father    Cancer Father    Thyroid disease Sister    Hypertension Sister    Bipolar disorder Sister    Heart disease Brother    Bipolar disorder Daughter    Bipolar disorder Maternal Aunt    Colon cancer Neg Hx    Colon polyps Neg Hx    Esophageal cancer Neg Hx    Kidney disease Neg Hx    Gallbladder disease Neg Hx    Diabetes Neg Hx    Dementia Neg Hx    Tremor Neg Hx    Seizures Neg Hx     Past Medical History:  Diagnosis Date    Anxiety    Bipolar 1 disorder ([place])    Breast density 06/25/2012   Right breast density, will get mammogram and [place]   Depression    Diabetes mellitus without complication ([place])    type 1   Duodenitis    Fibromyalgia    GERD (gastroesophageal reflux disease)    History of kidney stones    HTN (hypertension)    Hyperlipidemia    IBS (irritable bowel syndrome)    Neuropathy    Sepsis ([place]) 02/27/2019    Patient Active Problem List   Diagnosis Date Noted   Syncope  and collapse 12/15/2020   Pure hypercholesterolemia 12/15/2020   Diabetic polyneuropathy ([place]) 03/03/2020   GERD without esophagitis 03/03/2020   Anxiety state    Acute pyelonephritis    Peripheral neuropathy 02/07/2017   Chronic constipation 05/13/2016   Suicide ideation 04/15/2012   LADA (latent autoimmune diabetes in adults), managed as type 1 ([place])    IBS (irritable bowel syndrome)    Essential hypertension    Bipolar 1 disorder ([place])    Abnormality of gait 11/07/2011    Past Surgical History:  Procedure Laterality Date   ABDOMINAL HYSTERECTOMY     partial   CHOLECYSTECTOMY     COLONOSCOPY     CYSTOSCOPY WITH RETROGRADE PYELOGRAM, URETEROSCOPY AND STENT PLACEMENT Right 03/31/2019   Procedure: CYSTOSCOPY WITH RETROGRADE PYELOGRAM, URETEROSCOPY AND STENT PLACEMENT;  Surgeon: Robley Fries, MD;  Location: [facility];  Service: Urology;  Laterality: Right;   CYSTOSCOPY WITH STENT PLACEMENT Right 03/02/2019   Procedure: CYSTOSCOPY WITH STENT PLACEMENT;  Surgeon: Robley Fries, MD;  Location: [facility];  Service: Urology;  Laterality: Right;   HOLMIUM LASER APPLICATION Right 05/18/6158   Procedure: HOLMIUM LASER APPLICATION;  Surgeon: Robley Fries, MD;  Location[facility];  Service: Urology;  Laterality: Right;   UPPER GASTROINTESTINAL ENDOSCOPY      Current Outpatient Medications  Medication Sig Dispense Refill   acetaminophen (TYLENOL) 500 MG tablet Take 1,000 mg by  mouth every 6 (six) hours as needed for mild pain or headache.     ALPRAZolam (XANAX) 1 MG tablet Take 1 mg by mouth 4 (four) times daily as needed for anxiety.      AMBULATORY NON FORMULARY MEDICATION Squatty Potty x 1 1 each 0   amLODipine (NORVASC) 5 MG tablet Take 5 mg by mouth daily.     aspirin EC 81 MG tablet Take 1 tablet (81 mg total) by mouth daily. Swallow whole. 30 tablet 11   BD PEN NEEDLE NANO U/F 32G X 4 MM MISC USE WITH INSULIN PEN FOUR TIMES A DAY 400 each 3   BENICAR 40 MG tablet Take 40 mg by mouth daily.     busPIRone (BUSPAR) 10 MG tablet Take 10 mg by mouth in the morning and at bedtime.     cholecalciferol (VITAMIN D3) 25 MCG (1000 UNIT) tablet Take 1,000 Units by mouth daily.     Docusate Sodium (COLACE PO) Take 100 mg by mouth daily.     esomeprazole (NEXIUM) 40 MG capsule Take 40 mg by mouth 2 (two) times daily.     gabapentin (NEURONTIN) 800 MG tablet Take 800 mg by mouth 3 (three) times daily.     Glucagon (BAQSIMI ONE PACK) 3 MG/DOSE POWD Use 1 spray in the nose as needed for hypoglycemia 1 each PRN   glucose blood (FREESTYLE LITE) test strip USE TO CHECK BLOOD SUGAR 6 TIMES PER DAY 600 each 3   insulin aspart (NOVOLOG FLEXPEN) 100 UNIT/ML FlexPen INJECT 8 TO 10 UNITS UNDER THE SKIN AT THE START OF THE THREE MAIN MEALS AS DIRECTED 30 mL 3   insulin glargine (LANTUS SOLOSTAR) 100 UNIT/ML Solostar Pen INJECT 45 units daily under skin as advised 45 mL 2   Insulin Syringes, Disposable, U-100 1 ML MISC To use for insulin injection.. 100 each 1   Lancets (FREESTYLE) lancets USE TO TEST BLOOD SUGAR 4 TO 6 TIMES DAILY AS INSTRUCTED 300 each 6   losartan (COZAAR) 25 MG tablet Take 25 mg by mouth daily.  metFORMIN (GLUCOPHAGE) 500 MG tablet TAKE 1 TABLET TWICE A DAY WITH MEALS 180 tablet 3   nortriptyline (PAMELOR) 50 MG capsule Take 50 mg by mouth at bedtime.      ondansetron (ZOFRAN-ODT) 4 MG disintegrating tablet PLACE 1 TO 2 TABLETS ON TONGUE EVERY 4 TO 6 HOURS AS  NEEDED FOR NAUSEA (Patient taking differently: Take 4-8 mg by mouth every 4 (four) hours as needed for nausea or vomiting.) 180 tablet 4   OSCIMIN 0.125 MG SUBL DISSOLVE 1 TABLET UNDER THE TONGUE EVERY 4 HOURS AS NEEDED (NEED APPOINTMENT) 360 tablet 0   Probiotic CAPS Take 1 capsule by mouth daily. 30 capsule 1   Propylene Glycol (SYSTANE BALANCE OP) Place 1 drop into both eyes daily as needed (dry eyes).     rosuvastatin (CRESTOR) 10 MG tablet Take 10 mg by mouth daily.     traZODone (DESYREL) 100 MG tablet 1  qhs  May increse to 2  qhs  After 1 week (Patient taking differently: Take 100 mg by mouth at bedtime. 1  qhs  May increse to 2  qhs  After 1 week) 180 tablet 1   venlafaxine (EFFEXOR) 37.5 MG tablet Take 37.5 mg by mouth daily.     vitamin B-12 (CYANOCOBALAMIN) 1000 MCG tablet Take 1,000 mcg by mouth daily.     No current facility-administered medications for this visit.    Allergies as of 02/27/2021 - Review Complete 02/27/2021  Allergen Reaction Noted   Abilify [aripiprazole]  08/10/2016   Phenergan [promethazine] Diarrhea and Nausea And Vomiting 07/08/2019   Reglan [metoclopramide] Other (See Comments) 03/23/2013    Vitals: Ht '5\' 4"'  (1.626 m)    Wt 173 lb (78.5 kg)    BMI 29.70 kg/m  Last Weight:  Wt Readings from Last 1 Encounters:  02/27/21 173 lb (78.5 kg)   Last Height:   Ht Readings from Last 1 Encounters:  02/27/21 '5\' 4"'  (1.626 m)     Physical exam: Exam: Gen: NAD, very conversant, well nourised, well groomed    CV: RRR, no MRG. No Carotid Bruits. No peripheral edema, warm, nontender Eyes: Conjunctivae clear without exudates or hemorrhage  Neuro: Detailed Neurologic Exam  Speech:    Speech is normal; fluent and spontaneous with normal comprehension.  Cognition:     Cranial Nerves:     The pupils are equal, round, and reactive to light. Attempted fundoscopy could not visualize.  Visual fields are full to finger confrontation. Extraocular movements are  intact. Trigeminal sensation is intact and the muscles of mastication are normal. The face is symmetric. The palate elevates in the midline. Hearing intact. Voice is normal. Shoulder shrug is normal. The tongue has normal motion without fasciculations.   Coordination:   Normal finger to nose and heel to shin.  Gait: erect posture, slightly low clearance but not shuffling, slightly decreased arm swing, external rotation of feet (Stable no progression since last being seen in 2021). Can heel and toe walk. Imbalance with tandem.   Motor Observation:    No asymmetry, no atrophy, and no involuntary movements noted. Tone:    Normal muscle tone.    Posture:    Posture is normal. normal erect    Strength:    Strength is V/V in the upper and lower limbs.      Sensation: intact to LT     Reflex Exam:  DTR's: absent AJs otherwise deep tendon reflexes in the upper and lower extremities are brisk bilaterally.   Toes:  The toes are equiv bilaterally.   Clonus:    Clonus is absent.    Assessment/Plan:  Osterloh is a 66 y.o. female here as requested by Redmond School, MD for syncope and muscle spasms.  Past medical history neuropathy, hyperlipidemia, hypertension, diabetes, depression, bipolar 1 disorder, anxiety, abnormality of gait, muscle weakness, lithium toxicity, suicidal ideation."I have no hope" she states she she has ongoing depression and anxiety, FHx extensive psychiatric disease, unknown FHx of dementia.  - Very unlikely seizures. May be slightly orthostatic (see below). Will order EEG and MRI brain to evaluate for seizure focus, strokes. But I suspect etiology is not primary neurologic. She should be on ASA daily due to prior stroke seen on MRI. Goal LDL < 70. Manage all vascular risk factors with primary care. Discussed stroke prevention. Will repeat MRI brain.   - Her vitals signs went from 166/101 93 laying to 153/91 p 100 sitting to 143/86 p 106 appears orthostatic but she was not  dizzy, still possibly this is a trigger if the syncope bc happens when she is always upright. She saw Dr. Marlou Porch in Cardiology as recent as 12/2020 and had evaluation.   - She feels her throat closing up, squeezing, hard to swallow pills. She denies coughing, if she takes things with water its better (may be dry mouth due to polypharmacy), she feels the muscles in her neck squeeze but no choking or aspiration. I recommended a swallow evaluation but she declines then she accepts, will order. Also recommend ENT evaluation per primary care as warranted, Dr. Gerarda Fraction.   - Of note I see a diagnosis of "Parkinson's disease" on her medication list, I am not aware that she has been diagnosed with that or that I diagnosed her with it, at one of my appointment she had some extrapyramidal symptoms(Parkinsonian does not mean Parkinson's disease) likely due to her antidopaminergic drugs but she was never diagnosed with Parkinson's disease as far as I know and not by me and I do NOT think she has parkinson's disease. Today her exam and gait is stable, no progression since 2021. NOT parkinson's disease.   - have seen her in the past for short term memory loss, I ordered formal memory testing and does not appear that was completed. I do not suspect a neurodegenerative disease, her neurologic exam including mental status is stable.   - Se declines PT for imbalance  Orders Placed This Encounter  Procedures   MR BRAIN W WO CONTRAST   SLP modified barium swallow   EEG adult      Barber LOSS 07/2019: -This is a patient with an extensive history of psychiatric disorders and medications.  I saw her 3 years ago for memory loss and today she is here for the same.  Mini-Mental status was 28 out of 30 several years ago today is 21 out of 30.  However I think it will be very difficult to differentiate between psychiatric disease and/or neurodegenerative brain disorder in this patient.   We will send her to formal memory testing and see if our colleagues can help [place] sort this out. -She has been on multiple dopaminergic blocking agents in the past and I think this may account for some mild parkinsonian traits that she has on examination such as her gait with low clearance, hypomimia, decreased arm swing.  I will get an MRI of the brain and cervical spine to ensure that she does not have NPH or  other disorder such as cervical stenosis with myelopathy given her very brisk reflexes and falls.  But I think she might have some parkinsonian traits secondary to dopamine blocking agents in the past/antipsychotic medications which can be the cause of gait disorder and falls. Recommend PT.  Formal memory testing   Cc: Redmond School, MD,    Sarina Ill, MD  [facility] [address] [place], [place] [address]  I spent 50 minutes of face-to-face and non-face-to-face time with patient on the  1. Syncope, unspecified syncope type   2. Transient alteration of awareness   3. Dysphagia, unspecified type     diagnosis.  This included previsit chart review, lab review, study review, order entry, electronic health record documentation, patient education on the different diagnostic and therapeutic options, counseling and coordination of care, risks and benefits of management, compliance, or risk factor reduction  Phone [phone] Fax [phone]

## 2021-02-27 NOTE — Telephone Encounter (Signed)
Medicare/tricare order sent to [facility], [facility] they will reach out to the patient to schedule.  °

## 2021-02-27 NOTE — Patient Instructions (Addendum)
MRI of the brain EEG No driving  Stroke Prevention Some medical conditions and behaviors can lead to a higher chance of having a stroke. You can help prevent a stroke by eating healthy, exercising, not smoking, and managing any medical conditions you have. Stroke is a leading cause of functional impairment. Primary prevention is particularly important because a majority of strokes are first-time events. Stroke changes the lives of not only those who experience a stroke but also their family and other caregivers. How can this condition affect me? A stroke is a medical emergency and should be treated right away. A stroke can lead to brain damage and can sometimes be life-threatening. If a person gets medical treatment right away, there is a better chance of surviving and recovering from a stroke. What can increase my risk? The following medical conditions may increase your risk of a stroke: Cardiovascular disease. High blood pressure (hypertension). Diabetes. High cholesterol. Sickle cell disease. Blood clotting disorders (hypercoagulable state). Obesity. Sleep disorders (obstructive sleep apnea). Other risk factors include: Being older than age 65. Having a history of blood clots, stroke, or mini-stroke (transient ischemic attack, TIA). Genetic factors, such as race, ethnicity, or a family history of stroke. Smoking cigarettes or using other tobacco products. Taking birth control pills, especially if you also use tobacco. Heavy use of alcohol or drugs, especially cocaine and methamphetamine. Physical inactivity. What actions can I take to prevent this? Manage your health conditions High cholesterol levels. Eating a healthy diet is important for preventing high cholesterol. If cholesterol cannot be managed through diet alone, you may need to take medicines. Take any prescribed medicines to control your cholesterol as told by your health care provider. Hypertension. To reduce your risk  of stroke, try to keep your blood pressure below 130/80. Eating a healthy diet and exercising regularly are important for controlling blood pressure. If these steps are not enough to manage your blood pressure, you may need to take medicines. Take any prescribed medicines to control hypertension as told by your health care provider. Ask your health care provider if you should monitor your blood pressure at home. Have your blood pressure checked every year, even if your blood pressure is normal. Blood pressure increases with age and some medical conditions. Diabetes. Eating a healthy diet and exercising regularly are important parts of managing your blood sugar (glucose). If your blood sugar cannot be managed through diet and exercise, you may need to take medicines. Take any prescribed medicines to control your diabetes as told by your health care provider. Get evaluated for obstructive sleep apnea. Talk to your health care provider about getting a sleep evaluation if you snore a lot or have excessive sleepiness. Make sure that any other medical conditions you have, such as atrial fibrillation or atherosclerosis, are managed. Nutrition Follow instructions from your health care provider about what to eat or drink to help manage your health condition. These instructions may include: Reducing your daily calorie intake. Limiting how much salt (sodium) you use to 1,500 milligrams (mg) each day. Using only healthy fats for cooking, such as olive oil, canola oil, or sunflower oil. Eating healthy foods. You can do this by: Choosing foods that are high in fiber, such as whole grains, and fresh fruits and vegetables. Eating at least 5 servings of fruits and vegetables a day. Try to fill one-half of your plate with fruits and vegetables at each meal. Choosing lean protein foods, such as lean cuts of meat, poultry without skin, fish, tofu,  beans, and nuts. Eating low-fat dairy products. Avoiding foods that  are high in sodium. This can help lower blood pressure. Avoiding foods that have saturated fat, trans fat, and cholesterol. This can help prevent high cholesterol. Avoiding processed and prepared foods. Counting your daily carbohydrate intake.  Lifestyle If you drink alcohol: Limit how much you have to: 0-1 drink a day for women who are not pregnant. 0-2 drinks a day for men. Know how much alcohol is in your drink. In the [place] one drink equals one 12 oz bottle of beer ( ), one 5 oz glass of wine ( ), or one 1 oz glass of hard liquor (31mL). Do not use any products that contain nicotine or tobacco. These products include cigarettes, chewing tobacco, and vaping devices, such as e-cigarettes. If you need help quitting, ask your health care provider. Avoid secondhand smoke. Do not use drugs. Activity  Try to stay at a healthy weight. Get at least 30 minutes of exercise on most days, such as: Fast walking. Biking. Swimming. Medicines Take over-the-counter and prescription medicines only as told by your health care provider. Aspirin or blood thinners (antiplatelets or anticoagulants) may be recommended to reduce your risk of forming blood clots that can lead to stroke. Avoid taking birth control pills. Talk to your health care provider about the risks of taking birth control pills if: You are over 104 years old. You smoke. You get very bad headaches. You have had a blood clot. Where to find more information American Stroke Association: www.strokeassociation.org Get help right away if: You or a loved one has any symptoms of a stroke. "BE FAST" is an easy way to remember the main warning signs of a stroke: B - Balance. Signs are dizziness, sudden trouble walking, or loss of balance. E - Eyes. Signs are trouble seeing or a sudden change in vision. F - Face. Signs are sudden weakness or numbness of the face, or the face or eyelid drooping on one side. A - Arms. Signs are weakness or  numbness in an arm. This happens suddenly and usually on one side of the body. S - Speech. Signs are sudden trouble speaking, slurred speech, or trouble understanding what people say. T - Time. Time to call emergency services. Write down what time symptoms started. You or a loved one has other signs of a stroke, such as: A sudden, severe headache with no known cause. Nausea or vomiting. Seizure. These symptoms may represent a serious problem that is an emergency. Do not wait to see if the symptoms will go away. Get medical help right away. Call your local emergency services (911 in the [place] Do not drive yourself to the hospital. Summary You can help to prevent a stroke by eating healthy, exercising, not smoking, limiting alcohol intake, and managing any medical conditions you may have. Do not use any products that contain nicotine or tobacco. These include cigarettes, chewing tobacco, and vaping devices, such as e-cigarettes. If you need help quitting, ask your health care provider. Remember "BE FAST" for warning signs of a stroke. Get help right away if you or a loved one has any of these signs. This information is not intended to replace advice given to you by your health care provider. Make sure you discuss any questions you have with your health care provider. Document Revised: 07/20/2019 Document Reviewed: 07/20/2019 Elsevier Patient Education  2022 [employer].

## 2021-02-28 DIAGNOSIS — H04123 Dry eye syndrome of bilateral lacrimal glands: Secondary | ICD-10-CM | POA: Diagnosis not present

## 2021-02-28 DIAGNOSIS — H0102A Squamous blepharitis right eye, upper and lower eyelids: Secondary | ICD-10-CM | POA: Diagnosis not present

## 2021-02-28 DIAGNOSIS — H0102B Squamous blepharitis left eye, upper and lower eyelids: Secondary | ICD-10-CM | POA: Diagnosis not present

## 2021-02-28 DIAGNOSIS — H1045 Other chronic allergic conjunctivitis: Secondary | ICD-10-CM | POA: Diagnosis not present

## 2021-03-02 ENCOUNTER — Other Ambulatory Visit (HOSPITAL_COMMUNITY): Payer: Self-pay

## 2021-03-02 DIAGNOSIS — R1310 Dysphagia, unspecified: Secondary | ICD-10-CM

## 2021-03-07 ENCOUNTER — Ambulatory Visit (INDEPENDENT_AMBULATORY_CARE_PROVIDER_SITE_OTHER): Payer: Medicare Other | Admitting: Neurology

## 2021-03-07 DIAGNOSIS — R55 Syncope and collapse: Secondary | ICD-10-CM

## 2021-03-07 DIAGNOSIS — R404 Transient alteration of awareness: Secondary | ICD-10-CM

## 2021-03-09 ENCOUNTER — Ambulatory Visit (INDEPENDENT_AMBULATORY_CARE_PROVIDER_SITE_OTHER): Payer: TRICARE For Life (TFL) | Admitting: Gastroenterology

## 2021-03-10 NOTE — Procedures (Signed)
? ? ?  History: ? ?66 year old woman with syncope  ? ?EEG classification: Awake and drowsy ? ?Description of the recording: The background rhythms of this recording consists of a fairly well modulated medium amplitude alpha rhythm of 10 Hz that is reactive to eye opening and closure. As the record progresses, the patient appears to remain in the waking state throughout the recording. Photic stimulation was performed, did not show any abnormalities. Hyperventilation was also performed, did not show any abnormalities. Toward the end of the recording, the patient enters the drowsy state with slight symmetric slowing seen. The patient never enters stage II sleep. No abnormal epileptiform discharges seen during this recording. There was no focal slowing. EKG monitor shows no evidence of cardiac rhythm abnormalities with a heart rate of 90. ? ?Impression: This is a normal EEG recording in the waking and drowsy state. No evidence of interictal epileptiform discharges seen. A normal EEG does not exclude a diagnosis of epilepsy.  ? ? ?Alric Ran, MD ?[facility] ?  ?

## 2021-03-16 ENCOUNTER — Other Ambulatory Visit: Payer: Self-pay

## 2021-03-16 ENCOUNTER — Telehealth: Payer: Self-pay | Admitting: Neurology

## 2021-03-16 ENCOUNTER — Ambulatory Visit (HOSPITAL_COMMUNITY)
Admission: RE | Admit: 2021-03-16 | Discharge: 2021-03-16 | Disposition: A | Discharge status: Home / Self Care | Payer: Medicare Other | Source: Ambulatory Visit | Attending: Neurology | Admitting: Neurology

## 2021-03-16 DIAGNOSIS — R1310 Dysphagia, unspecified: Secondary | ICD-10-CM | POA: Insufficient documentation

## 2021-03-16 NOTE — Telephone Encounter (Signed)
Pt's husband called requesting a call back with the pt's NCV/EMG results. Please advise. ?

## 2021-03-16 NOTE — Telephone Encounter (Signed)
Called pt's husband. He said he saw the EEG results on Mychart that indicated EEG normal. Note pt referring to EEG not EMG. He verbalized appreciation for the call back.  ?

## 2021-03-16 NOTE — Progress Notes (Signed)
? ?  Modified Barium Swallow Progress Note ? ?Patient Details  ?Name: [name] ?MRN: [id number] ?Date of Birth: 06-19-1955 ? ?Today's Date: 03/16/2021 ? ?Modified Barium Swallow completed.  Full report located under Chart Review in the Imaging Section. ? ?Brief recommendations include the following: ? ?Clinical Impression ? Pt was seen in radiology suite for modified barium swallow study. Trials of puree solids, regular texture solids, a 21mm barium tablet, and thin liquids via cup and straw were administered. Pt's oropharyngeal swallow mechanism was within functional limits. Pt did not allow challenges of multiple consecutive swallows due to complaint of the barium tasting like "liquid rubber"; however, no instances of penetration or aspiration were demonstrated. It is recommended that her current diet of regular texture solids and thin liquids be continued. Further skilled SLP services are not clinically indicated at this time. ?  ?Swallow Evaluation Recommendations ? ?   ? ? SLP Diet Recommendations: Regular solids;Thin liquid ? ? Liquid Administration via: Cup;Straw ? ? Medication Administration: Whole meds with liquid ? ? Supervision: Patient able to self feed ? ?   ? ? Postural Changes: Seated upright at 90 degrees ? ? Oral Care Recommendations: Oral care BID ? ?   ? ? ?[name] I. Vear Clock, MS, CCC-SLP ?[facility] ?Office number [phone] ?Pager [phone] ? ?[name] ?03/16/2021,1:09 PM ? ? ?

## 2021-03-16 NOTE — Telephone Encounter (Signed)
Reviewed chart. I do not see where an EMG/NCV was done, nor has one been ordered/scheduled.  ?

## 2021-03-21 DIAGNOSIS — F3181 Bipolar II disorder: Secondary | ICD-10-CM | POA: Diagnosis not present

## 2021-03-23 ENCOUNTER — Ambulatory Visit
Admission: RE | Admit: 2021-03-23 | Discharge: 2021-03-23 | Disposition: A | Discharge status: Home / Self Care | Payer: Medicare Other | Source: Ambulatory Visit | Attending: Neurology | Admitting: Neurology

## 2021-03-23 DIAGNOSIS — R404 Transient alteration of awareness: Secondary | ICD-10-CM

## 2021-03-23 DIAGNOSIS — R55 Syncope and collapse: Secondary | ICD-10-CM | POA: Diagnosis not present

## 2021-03-23 MED ORDER — GADOBENATE DIMEGLUMINE 529 MG/ML IV SOLN
16.0000 mL | Freq: Once | INTRAVENOUS | Status: AC | PRN
  Administered 2021-03-23: 16 mL via INTRAVENOUS

## 2021-03-24 ENCOUNTER — Other Ambulatory Visit: Payer: Self-pay

## 2021-03-24 DIAGNOSIS — E139 Other specified diabetes mellitus without complications: Secondary | ICD-10-CM

## 2021-03-24 MED ORDER — FREESTYLE LITE TEST VI STRP: ORAL_STRIP | 3 refills | Status: DC

## 2021-03-30 ENCOUNTER — Telehealth: Payer: Self-pay

## 2021-03-30 NOTE — Telephone Encounter (Signed)
Pt called and lvm to advise her blood sugars have been up and down. As high as 326 and dropped down to as low as 48. ?

## 2021-04-03 DIAGNOSIS — Z0001 Encounter for general adult medical examination with abnormal findings: Secondary | ICD-10-CM | POA: Diagnosis not present

## 2021-04-03 DIAGNOSIS — Z6828 Body mass index (BMI) 28.0-28.9, adult: Secondary | ICD-10-CM | POA: Diagnosis not present

## 2021-04-03 DIAGNOSIS — E1140 Type 2 diabetes mellitus with diabetic neuropathy, unspecified: Secondary | ICD-10-CM | POA: Diagnosis not present

## 2021-04-03 DIAGNOSIS — E663 Overweight: Secondary | ICD-10-CM | POA: Diagnosis not present

## 2021-04-03 DIAGNOSIS — E559 Vitamin D deficiency, unspecified: Secondary | ICD-10-CM | POA: Diagnosis not present

## 2021-04-03 DIAGNOSIS — Z1331 Encounter for screening for depression: Secondary | ICD-10-CM | POA: Diagnosis not present

## 2021-04-03 DIAGNOSIS — I10 Essential (primary) hypertension: Secondary | ICD-10-CM | POA: Diagnosis not present

## 2021-04-03 IMAGING — DX DG CHEST 1V PORT
1 series · 1 of 1 positions shown · non-contrast
Comparison: 02/25/2020

CLINICAL DATA: Questionable sepsis and weakness

EXAM:
PORTABLE CHEST 1 VIEW

[chest]
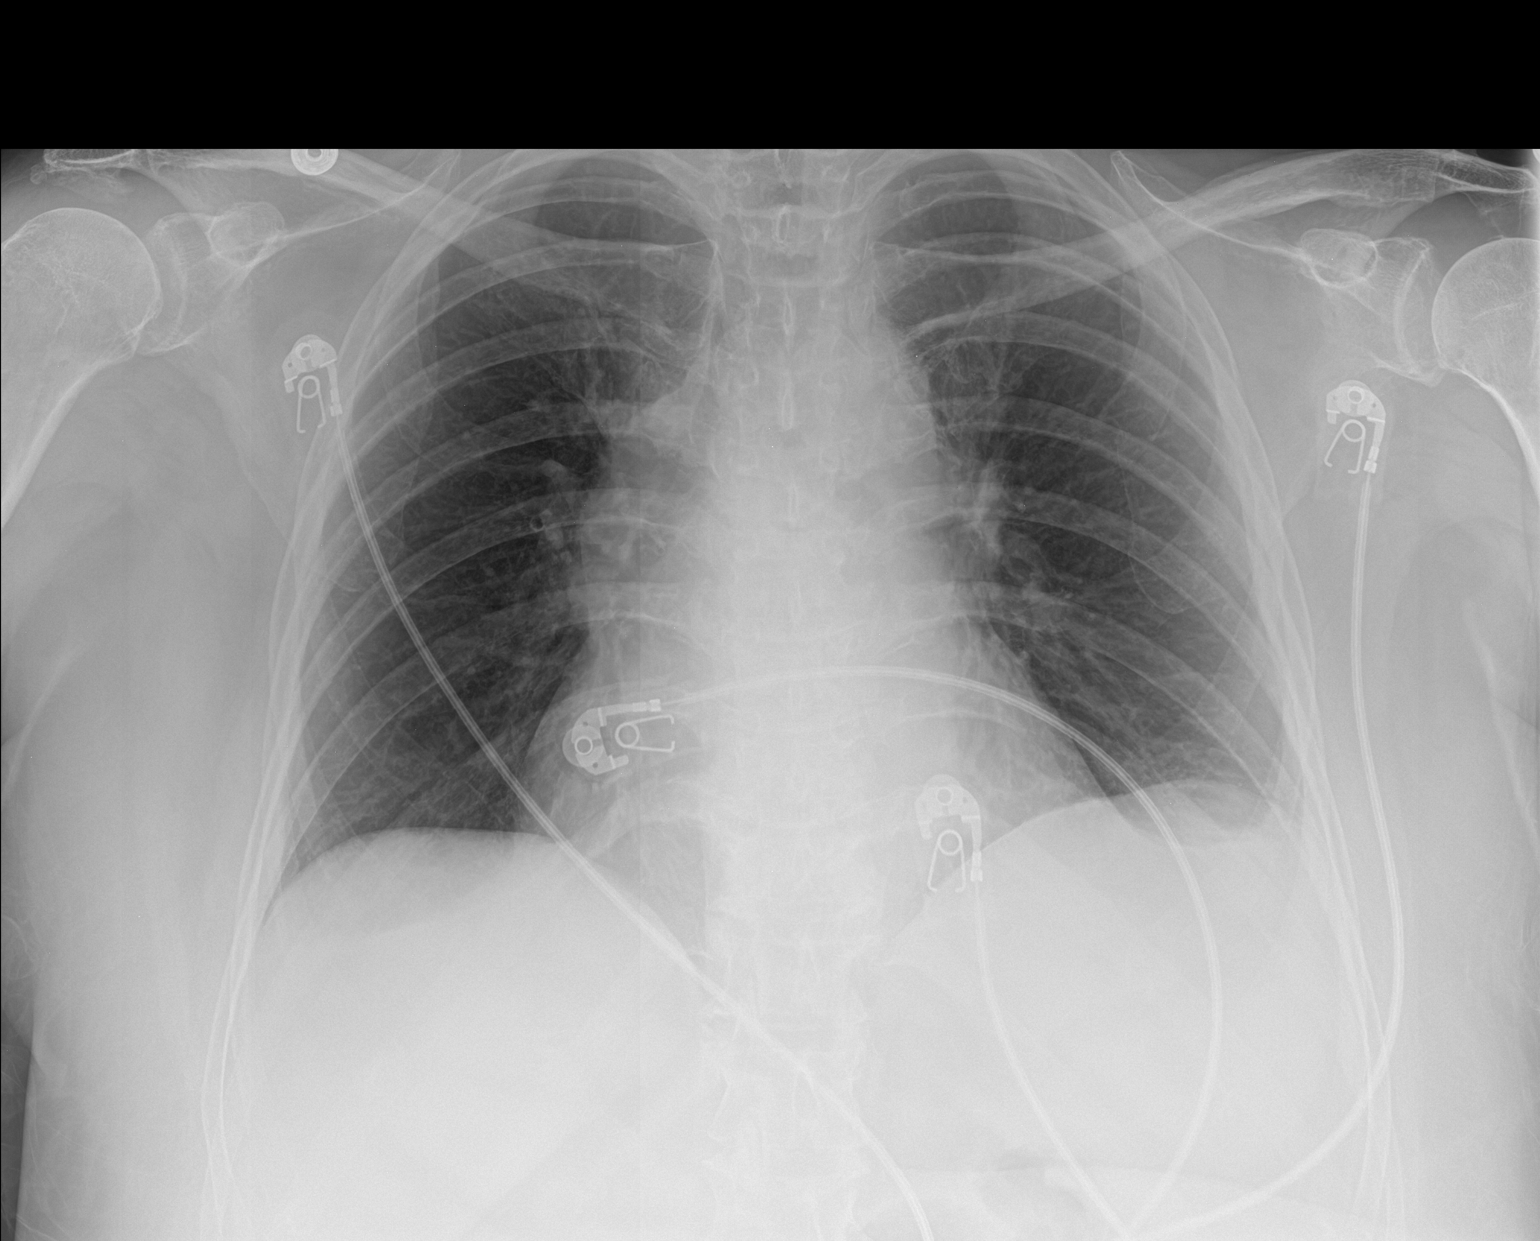

[1 of 1 positions shown; findings below may reference images not displayed]

FINDINGS: Cardiac shadow is stable. Lungs are well aerated bilaterally. No
focal infiltrate or sizable effusion is seen. Minimal scarring is
noted in the left base. No bony abnormality is noted.
IMPRESSION: Minimal left basilar scarring.  No acute abnormality noted.

## 2021-04-05 ENCOUNTER — Telehealth: Payer: Self-pay

## 2021-04-05 NOTE — Telephone Encounter (Signed)
Sunday  ?B: 122 ?L: 316 ?D: 304 ?Monday ?B: 90 ?L: 144 ?D: 292 ?Tuesday ?B: 127 ?L: 70 ?D: 178 ?Before bed: 356 ?Wednesday ?68 ?Pt is taking insulin as prescribed 8-10 units of Novolog and Lantus 45 units (30 units at 9 pm and the other 15 units at around 3 am) Pt was on a regimen of steroids and antibiotics for a sinus infection a few weeks ago. Pt states she has had both highs and lows and unsure of the reason why. She states she has been eating better and staying away from sweet drinks. ?

## 2021-04-05 NOTE — Telephone Encounter (Signed)
Called and left a detailed message for pt with provider recommendation.  ?

## 2021-04-06 DIAGNOSIS — E1140 Type 2 diabetes mellitus with diabetic neuropathy, unspecified: Secondary | ICD-10-CM | POA: Diagnosis not present

## 2021-04-10 DIAGNOSIS — F3181 Bipolar II disorder: Secondary | ICD-10-CM | POA: Diagnosis not present

## 2021-04-11 ENCOUNTER — Telehealth: Payer: Self-pay

## 2021-04-11 NOTE — Telephone Encounter (Signed)
Patient is having trouble with various blood sugars.  Ranging 58-300.  She has been taking prednisone.  Spoke with patient.  She has a follow up next week and will discuss concerns then. ?

## 2021-04-14 ENCOUNTER — Telehealth: Payer: Self-pay

## 2021-04-14 NOTE — Telephone Encounter (Signed)
Pt called to provide blood sugar readings from the last 2 days. ?04/13/21: ?Early Morning: 160 ?Early Morning: 82 (2 glucose tabs) ?B: 146 (+10 units of Novolog) ?D: 170 (+12 units of Novolog) ?2 H later: 95 (+2 glucose tabs) ?Bedtime138 (+15 units of Lantus) ?04/14/21 ?Early morning: 47 (2 glucose tabs) ?Breakfast: 242 (+12 units of Novolog) ?2 h later: 200 ?2 h later: 150 ?4:25pm: 53 (2 glucose tabs) ?On-call provider advised pt to She needs to take her Lantus before breakfast and about 12 hours later  ?Since her only consistently high readings after supper she can increase her NovoLog before dinnertime to 12 units instead of 8 or 10. Pt was only increasing her mealtime insulin and had not separated her Lantus injections by 12 hours. Pt was advised of previous recommendation. Pt also wanted to know if her appt could be moved up. Any day but wednesdays works for her.  ?

## 2021-04-14 NOTE — Telephone Encounter (Signed)
We had her on the following regimen before.  ?- Metformin 500 mg 2x a day with meals ?- Lantus 15 units in am (at 2 am) and 30 units at bedtime ?- NovoLog:  ?8-10 units in am ?8-10 units with lunch ?6-8 units with dinner ? ?She recently had some higher blood sugars due to prednisone.  For now, if she is off the prednisone, I feel that 12 units of NovoLog is too much for her.  I will continue with the same dose of Lantus that we had her on before.  We tried separating the Lantus by 12 hours before but this did not work very well for her.  Therefore, for now, I would suggest to go back to the above regimen.  We can keep the regular appointment time for now. ?

## 2021-04-16 ENCOUNTER — Encounter: Payer: Self-pay | Admitting: Internal Medicine

## 2021-04-20 NOTE — Telephone Encounter (Signed)
Pt advised via Mychart message. 

## 2021-04-25 ENCOUNTER — Ambulatory Visit (INDEPENDENT_AMBULATORY_CARE_PROVIDER_SITE_OTHER): Payer: Medicare Other | Admitting: Internal Medicine

## 2021-04-25 ENCOUNTER — Encounter: Payer: Self-pay | Admitting: Internal Medicine

## 2021-04-25 VITALS — BP 130/100 | HR 101 | Ht 64.0 in | Wt 175.2 lb

## 2021-04-25 DIAGNOSIS — E785 Hyperlipidemia, unspecified: Secondary | ICD-10-CM

## 2021-04-25 DIAGNOSIS — G63 Polyneuropathy in diseases classified elsewhere: Secondary | ICD-10-CM

## 2021-04-25 DIAGNOSIS — E139 Other specified diabetes mellitus without complications: Secondary | ICD-10-CM

## 2021-04-25 NOTE — Progress Notes (Signed)
Patient ID: [name], female   DOB: [birth year], 66 y.o.   MRN: [id number] ? ?This visit occurred during the SARS-CoV-2 public health emergency.  Safety protocols were in place, including screening questions prior to the visit, additional usage of staff PPE, and extensive cleaning of exam room while observing appropriate contact time as indicated for disinfecting solutions.  ? ?HPI: ?[name] is a 66 y.o.-year-old female, initially referred by her PCP, Dr. Assunta Found (PA: Lenise Herald), returning for f/u for LADA, dx 2005, insulin-dependent since ~2006, controlled, with complications (PN, gastroparesis?).  Last visit 3 months ago.  She is here with her husband who offers part of the history and has several questions about patient's diabetes management. ? ?Interim history: ?She has bipolar disease  and also has short-term memory loss and disequilibrium.  She sees neurology. She also has anxiety and sees a Veterinary surgeon. ?She continues to have nausea and increased urination, but no blurry vision, chest pain.  She had dry eyes and saw Dr. Dione Booze recently. ?She had a sinus infections - on ABx and steroids - 1 mo ago.  ? ?Reviewed history: ?She was on an Omnipod insulin pump on 08/01/2015. She had severe anxiety and depression and got so overwhelmed with the insulin pump that we had to take her off the pump.  ?We switched her to a basal-bolus insulin regimen, but she was unable to calculate the insulin doses based on insulin to carb ratio, so now she is using fixed rapid acting insulin doses. ? ?Reviewed HbA1c levels: ?03/2021: Reportedly HbA1c 4.1% in Dr. Sharyon Medicus office. ?01/24/2021: HbA1c calculated from fructosamine is 6.9%. ?10/17/2020: HbA1c calculated from fructosamine is 7.25%. ?07/11/2020: HbA1c calculated from fructosamine is 7.3%, slightly higher than before. ?04/21/2020: HbA1c calculated from fructosamine has improved to 7.1%. ?Lab Results  ?Component Value Date  ? HGBA1C 5.0 03/03/2020  ? HGBA1C 5.8 (A)  11/09/2019  ? HGBA1C 5.7 (A) 06/30/2019  ?03/04/2018:  Hba1c calculated from fructosamine is the best in a long time, at 6.84%! ?12/19/2017: HbA1c calculated from fructosamine has improved to 7.1%! ?09/12/2017: HbA1c  Calculated from the fructosamine is 7.5%, improved from before. ?05/07/2017: HbA1c calculated from the fructosamine was 7.7% ?02/07/2017: HbA1c calculated from the fructosamine was higher, at 7.7%. ?11/07/2016: HbA1c calc. from the fructosamine is a little lower, at 7.25% ?08/07/2016: HbA1c calculated from the fructosamine is 7.5% ?05/31/2016: HbA1c calculated from fructosamine is better, at 7.25% ?12/15/2015: HbA1c calculated from the fructosamine is higher, 8.4%. ?08/24/2015:  HbA1c calculated from fructosamine is 7.4%. ?05/24/2015: HbA1c calculated from fructosamine is 6.9%. ? ?In 01/2019, she had a professional CGM attached: ?[employer] professional CGM traces between 12/31/2018 at 01/11/2019 were analyzed and the reports will be scanned. ? ?Patient's sugars are variable but consistent patterns were noticed: Sugars are low in the second half of the night and morning, but they increase significantly after breakfast.  Around lunchtime she starts to drop her sugars and they increase but in the less significant fashion after dinner. ? ?Patient was advised to: ?- reduce the dose of Lantus to 30 units for now.  ?- If the sugars after breakfast remain high after reducing the Lantus,  increase the dose of rapid acting insulin with breakfast by 2 units. ?- inject approximately 15 minutes before the meal to see if this helps reducing the sugars after breakfast. ?-  One of her concerns was about what to do if she is not hungry at lunch.  Reducing the Lantus will help her not drop even  if she is not eating, however, if she does feel like she is dropping she may need glucose tablets, 2-4 to keep her sugars up. ? ?She is on: ?- Metformin 1000 mg with dinner >> 500 mg 2x a day ?- Lantus  30-34 >> 36 >> 30 units  at bedtime and 15 units 4h later (2 am) >> 15 units at 3 a.m. and 30 units at bedtime ?- NovoLog:  ?10-12 >> 8-10 units in am ?10-12 >> 8-10 units with lunch ?10-12 >> 8-10 >> 6-8 units with dinner ?- Novolog Sliding scale: ?201-250: + 2 units ?251-300: + 3 units ?301-350: + 4 units ?>350: + 5 units ? ?Pt checks her sugars ~3-6 times a day per review of her log: ?- am:  71, 74, 130-228, 253, 328 >> 76, 95-219, 236 >> 79-216, 234, 285 >> 68, 90-173, 219, 263 ?- 2h after b'fast:  149, 229 >> 177, 208, 385 >> 54, 274 >> n/c ?- before lunch:  45, 54, 72, 75-210, 301, 314 >> 51-170, 222 >> 62-206, 215, 27 >> 70-245, 316 ?- 2h after lunch: 43 >> n/c >> 45-178, 209 >> 126-162, 354 >> n/c >> 138 >> 98, 77 ?- before dinner: 65-270 >> 63-187, 204 >> 54, 67-225, 248, 288 >> 87-258, 321 ?- 2h after dinner: 77-210 >> 265 >> 30, 43-77, 235 >> 84-125, 358 >> 77 ?- bedtime: 37,41-307, 319, 349 >> 53, 60-231, 231, 311 >> 53-306 ?- at night: 52, 65 >> 83, 92, 383  >> 34, 45, 79, 124 >> 104-193 >> 83, 136-300 ?Lowest sugar was 32 >>... 40 >> 53 >> 53; she has hypoglycemia awareness in the 35s ?Highest sugar was 351 >> 383 >> 358. ? ?No CKD, last BUN/creatinine:  ?Lab Results  ?Component Value Date  ? BUN 6 (L) 03/06/2020  ? CREATININE 0.71 03/06/2020  ? ?No microalbuminuria: ?Lab Results  ?Component Value Date  ? MICRALBCREAT 5.4 11/07/2016  ?  ?+ Dyslipidemia: ?Lab Results  ?Component Value Date  ? CHOL 178 10/17/2020  ? HDL 45.70 10/17/2020  ? LDLCALC 100 (H) 10/17/2020  ? TRIG 165.0 (H) 10/17/2020  ? CHOLHDL 4 10/17/2020  ?She was started on Crestor 10 mg daily. ? ?-Last eye exam was on 02/2021: No DR reportedly, but dry eyes >> Dr Katy Fitch. ? ?-+ Numbness and tingling in her feet.  Also, occasional pain.  She was on Neurontin, but now only on nortriptyline.  These are refilled by PCP.  She is also on alpha-lipoic acid. ?She sees a podiatrist [facility]).  She had 2 toenails removed in the past due to fungal  infection. ? ?Pt has a h/o admission for Li toxicity 04/2012.  She has a diagnosis of Parkinson's disease.  She also has bipolar disease. ?Also: GERD, HTN, fibromyalgia. ? ?Latest TSH was normal: ?Lab Results  ?Component Value Date  ? TSH 0.533 03/03/2020  ? ?ROS: ?+ See HPI ?Neurological: no tremors/+ numbness/+ tingling/no dizziness, + disequilibrium ? ?I reviewed pt's medications, allergies, PMH, social hx, family hx, and changes were documented in the history of present illness. Otherwise, unchanged from my initial visit note. ? ?Past Medical History:  ?Diagnosis Date  ? Anxiety   ? Bipolar 1 disorder ([place])   ? Breast density 06/25/2012  ? Right breast density, will get mammogram and [place]  ? Depression   ? Diabetes mellitus without complication ([place])   ? type 1  ? Duodenitis   ? Fibromyalgia   ? GERD (gastroesophageal reflux disease)   ?  History of kidney stones   ? HTN (hypertension)   ? Hyperlipidemia   ? IBS (irritable bowel syndrome)   ? Neuropathy   ? Sepsis ([place]) 02/27/2019  ? ?Past Surgical History:  ?Procedure Laterality Date  ? ABDOMINAL HYSTERECTOMY    ? partial  ? CHOLECYSTECTOMY    ? COLONOSCOPY    ? CYSTOSCOPY WITH RETROGRADE PYELOGRAM, URETEROSCOPY AND STENT PLACEMENT Right 03/31/2019  ? Procedure: CYSTOSCOPY WITH RETROGRADE PYELOGRAM, URETEROSCOPY AND STENT PLACEMENT;  Surgeon: Robley Fries, MD;  Location[facility];  Service: Urology;  Laterality: Right;  ? CYSTOSCOPY WITH STENT PLACEMENT Right 03/02/2019  ? Procedure: CYSTOSCOPY WITH STENT PLACEMENT;  Surgeon: Robley Fries, MD;  Location: [place];  Service: Urology;  Laterality: Right;  ? HOLMIUM LASER APPLICATION Right [id number]  ? Procedure: HOLMIUM LASER APPLICATION;  Surgeon: Robley Fries, MD;  Location[facility];  Service: Urology;  Laterality: Right;  ? UPPER GASTROINTESTINAL ENDOSCOPY    ? ?Social History  ? ?Socioeconomic History  ? Marital status: Married  ?  Spouse name: phillip  ? Number of  children: 1  ? Years of education: 55  ? Highest education level: High school graduate  ?Occupational History  ? Occupation: Disabled  ?Tobacco Use  ? Smoking status: Never  ? Smokeless tobacco: Never  ?Vaping Use

## 2021-04-25 NOTE — Patient Instructions (Addendum)
Please continue: ?- Metformin 500 mg 2x a day with meals ?- Lantus 15 units in am (at 3 am) and 30 units at bedtime ?- NovoLog:  ?8-10 units in am ?8-10 units with lunch ?6-8 units with dinner ?- Novolog Sliding scale: ?201-250: + 2 units ?251-300: + 3 units ?301-350: + 4 units ?>350: + 5 units ?If your sugars are <100 and you eat a low carb meal, inject only ~6 units. ?So, If sugars are <100 before the meals, you still need to take insulin for that meal. ?Do not correct sugars <300 at bedtime. ?If sugars are <70 before a meal, bring the sugar up first, then inject insulin for the meals. ?

## 2021-04-29 LAB — BASIC METABOLIC PANEL WITH GFR
BUN: 19 mg/dL (ref 7–25)
CO2: 29 mmol/L (ref 20–32)
Calcium: 9.5 mg/dL (ref 8.6–10.4)
Chloride: 103 mmol/L (ref 98–110)
Creat: 0.6 mg/dL (ref 0.50–1.05)
Glucose, Bld: 109 mg/dL — ABNORMAL HIGH (ref 65–99)
Potassium: 3.6 mmol/L (ref 3.5–5.3)
Sodium: 141 mmol/L (ref 135–146)
eGFR: 98 mL/min/{1.73_m2} (ref >=60)

## 2021-04-29 LAB — FRUCTOSAMINE: Fructosamine: 335 umol/L — ABNORMAL HIGH (ref 205–285)

## 2021-05-01 DIAGNOSIS — F3181 Bipolar II disorder: Secondary | ICD-10-CM | POA: Diagnosis not present

## 2021-05-02 DIAGNOSIS — H1045 Other chronic allergic conjunctivitis: Secondary | ICD-10-CM | POA: Diagnosis not present

## 2021-05-02 DIAGNOSIS — H0102A Squamous blepharitis right eye, upper and lower eyelids: Secondary | ICD-10-CM | POA: Diagnosis not present

## 2021-05-02 DIAGNOSIS — H04123 Dry eye syndrome of bilateral lacrimal glands: Secondary | ICD-10-CM | POA: Diagnosis not present

## 2021-05-02 DIAGNOSIS — H0102B Squamous blepharitis left eye, upper and lower eyelids: Secondary | ICD-10-CM | POA: Diagnosis not present

## 2021-05-02 DIAGNOSIS — H2513 Age-related nuclear cataract, bilateral: Secondary | ICD-10-CM | POA: Diagnosis not present

## 2021-05-04 ENCOUNTER — Encounter: Payer: Self-pay | Admitting: Internal Medicine

## 2021-05-04 DIAGNOSIS — E139 Other specified diabetes mellitus without complications: Secondary | ICD-10-CM

## 2021-05-04 DIAGNOSIS — Z20822 Contact with and (suspected) exposure to covid-19: Secondary | ICD-10-CM | POA: Diagnosis not present

## 2021-05-05 DIAGNOSIS — N3020 Other chronic cystitis without hematuria: Secondary | ICD-10-CM | POA: Diagnosis not present

## 2021-05-05 DIAGNOSIS — N200 Calculus of kidney: Secondary | ICD-10-CM | POA: Diagnosis not present

## 2021-05-05 DIAGNOSIS — L900 Lichen sclerosus et atrophicus: Secondary | ICD-10-CM | POA: Diagnosis not present

## 2021-05-08 NOTE — Telephone Encounter (Signed)
Addressed in different encounter.

## 2021-05-09 ENCOUNTER — Telehealth: Payer: Self-pay | Admitting: Pharmacy Technician

## 2021-05-09 ENCOUNTER — Other Ambulatory Visit (HOSPITAL_COMMUNITY): Payer: Self-pay

## 2021-05-09 MED ORDER — DEXCOM G6 SENSOR MISC: 3 refills | Status: DC

## 2021-05-09 MED ORDER — DEXCOM G6 TRANSMITTER MISC: 3 refills | Status: DC

## 2021-05-09 MED ORDER — DEXCOM G6 RECEIVER DEVI: 0 refills | Status: DC

## 2021-05-09 NOTE — Telephone Encounter (Signed)
Patient Advocate Encounter ? ?Received notification from [facility] OFFICE that prior authorization for DEXCOM G6 RECEIVER, TRANSMITTER, ANS SENSOR is required. ?  ?PA submitted on 5.9.23 ?RECEIVER Key [id number]  ?TRANSMITTER KEY [id number] ?SENSOR KEY [id number] ?Status is pending ?  ?[facility] will continue to follow ? ?Trey Bebee R Rejeana Fadness, CPhT ?Patient Advocate ?[facility] ?Phone: [phone] ?Fax:  [phone] ? ?

## 2021-05-09 NOTE — Telephone Encounter (Signed)
Pt's called to request a PA for Dexcom be called into [facility] at [phone]. PA team notified. ?

## 2021-05-11 ENCOUNTER — Other Ambulatory Visit (HOSPITAL_COMMUNITY): Payer: Self-pay

## 2021-05-11 ENCOUNTER — Telehealth: Payer: Self-pay

## 2021-05-11 NOTE — Telephone Encounter (Signed)
Called and confirmed that pt has completed a comprehensive diabetes education program.  ?Patient confirmed to wear CGM as directed. ?Patient agreed to share device readings with managing healthcare professional for overall diabetes management. ?

## 2021-05-11 NOTE — Telephone Encounter (Signed)
PA clinical questions completed and submitted. 05/11/21 ?Patient Advocate Encounter ? ?Prior Authorization for Dexcom has been approved.   ? ?PA# PA Case ID: [id number] ?Effective dates: 04/11/21 through 05/11/22 ? ?Per Test Claim Patients co-pay is [phone] Transmitter, $38 for 3 sensors, $114 receiver.  ? ? ? ?

## 2021-05-15 ENCOUNTER — Other Ambulatory Visit (HOSPITAL_COMMUNITY): Payer: Self-pay

## 2021-05-15 DIAGNOSIS — Z6828 Body mass index (BMI) 28.0-28.9, adult: Secondary | ICD-10-CM | POA: Diagnosis not present

## 2021-05-15 DIAGNOSIS — E1140 Type 2 diabetes mellitus with diabetic neuropathy, unspecified: Secondary | ICD-10-CM | POA: Diagnosis not present

## 2021-05-15 DIAGNOSIS — R232 Flushing: Secondary | ICD-10-CM | POA: Diagnosis not present

## 2021-05-15 DIAGNOSIS — R4586 Emotional lability: Secondary | ICD-10-CM | POA: Diagnosis not present

## 2021-05-15 DIAGNOSIS — E559 Vitamin D deficiency, unspecified: Secondary | ICD-10-CM | POA: Diagnosis not present

## 2021-05-15 DIAGNOSIS — I10 Essential (primary) hypertension: Secondary | ICD-10-CM | POA: Diagnosis not present

## 2021-05-15 DIAGNOSIS — E663 Overweight: Secondary | ICD-10-CM | POA: Diagnosis not present

## 2021-05-16 MED ORDER — DEXCOM G6 RECEIVER DEVI: 0 refills | Status: DC

## 2021-05-16 MED ORDER — DEXCOM G6 TRANSMITTER MISC: 3 refills | Status: DC

## 2021-05-16 MED ORDER — DEXCOM G6 SENSOR MISC: 3 refills | Status: DC

## 2021-06-01 DIAGNOSIS — F3181 Bipolar II disorder: Secondary | ICD-10-CM | POA: Diagnosis not present

## 2021-06-06 ENCOUNTER — Encounter: Payer: Medicare Other | Attending: Internal Medicine | Admitting: Nutrition

## 2021-06-06 ENCOUNTER — Other Ambulatory Visit: Payer: Self-pay | Admitting: Internal Medicine

## 2021-06-06 DIAGNOSIS — E139 Other specified diabetes mellitus without complications: Secondary | ICD-10-CM | POA: Insufficient documentation

## 2021-06-07 ENCOUNTER — Telehealth: Payer: Self-pay

## 2021-06-07 ENCOUNTER — Other Ambulatory Visit: Payer: Self-pay | Admitting: Internal Medicine

## 2021-06-07 NOTE — Patient Instructions (Signed)
Patient is here with her husband to learn about how to use the Dexcom CGM.  We discussed the difference  between sensor glucose and CGM, and when it is necessary to test blood sugar readings.  They reported good understanding of this.  The dexcom was started and this was linked to [facility] via the clarity app. They were encouraged to read the manual and to call the 800 help line if questions or problems.

## 2021-06-07 NOTE — Telephone Encounter (Signed)
Pt called and left a message requesting a call back.

## 2021-06-07 NOTE — Telephone Encounter (Signed)
Called and lvm for pt to call back with any questions or concerns. Pt advised to send MyChart message if needed.

## 2021-06-08 NOTE — Telephone Encounter (Signed)
Called and unable to leave a message. MyChart message sent to pt to follow up.

## 2021-06-28 ENCOUNTER — Encounter: Payer: Self-pay | Admitting: Internal Medicine

## 2021-06-28 ENCOUNTER — Telehealth: Payer: Self-pay | Admitting: Nutrition

## 2021-06-28 NOTE — Telephone Encounter (Signed)
LVM to call me back to let me know if blood sugar readings have come down.

## 2021-06-28 NOTE — Telephone Encounter (Signed)
LVM to call me back with blood sugar readings

## 2021-07-04 DIAGNOSIS — F3181 Bipolar II disorder: Secondary | ICD-10-CM | POA: Diagnosis not present

## 2021-07-07 DIAGNOSIS — E1140 Type 2 diabetes mellitus with diabetic neuropathy, unspecified: Secondary | ICD-10-CM | POA: Diagnosis not present

## 2021-07-07 DIAGNOSIS — G4700 Insomnia, unspecified: Secondary | ICD-10-CM | POA: Diagnosis not present

## 2021-07-07 DIAGNOSIS — I10 Essential (primary) hypertension: Secondary | ICD-10-CM | POA: Diagnosis not present

## 2021-07-17 ENCOUNTER — Encounter: Payer: Medicare Other | Attending: Internal Medicine | Admitting: Nutrition

## 2021-07-17 DIAGNOSIS — E139 Other specified diabetes mellitus without complications: Secondary | ICD-10-CM | POA: Insufficient documentation

## 2021-07-17 DIAGNOSIS — Z713 Dietary counseling and surveillance: Secondary | ICD-10-CM | POA: Diagnosis not present

## 2021-07-18 NOTE — Telephone Encounter (Signed)
Saw her yesterday.  Thank you

## 2021-07-19 NOTE — Progress Notes (Signed)
Patient is here with her husband to review her diet and blood sugar readings.   Breakfasts vary in amounts of carbs from 15 to 90.  Discussed the importance of not eating cold cereal and milk.  She does this 1-2 times per week, and was shown how the blood sugars rise from her Dexcom download when eating this meal. Per her request.  6 breakfast suggestions written/given to her with 30 grams of carb, 7ounces protein and 5-10 grams of fat.    Lunch is usually balanced, but very low in carb-15, causing hunger and snacking in afternoon-which she is not giving insulin for and result in high readings.  She was shown this on her download, and suggested she add 1 extra serving of carbs at lunch with meals containing only 15 grams of carb.   She agreed to do this.    Supper carbs vary from 30-90 but patient takes same dose of insulin.     Also suggested she add 1-2 extra units of insulin for high carb supper meals like rice and pasta.  She agreed to do this.   Pt. Taking Lantus 9PM and 3 AM.  Suggested she change this to 9PM and 9AM for more even coverage during the 24 hour period.  She agreed to do this. She takes Novolog 10-15 minutes before meals, and was praised for this.

## 2021-07-19 NOTE — Patient Instructions (Signed)
Stop all cold cereal and milk Make sure all meals have protein, and at least 2 servings of carbohydrates Add 1-2 units more of Novolog when eating a high carb meal with rice or pasta Take Lantas at 9AM and 9PM Continue to take Novolog 10-15 minutes before meals.

## 2021-07-25 ENCOUNTER — Telehealth: Payer: Self-pay | Admitting: Nutrition

## 2021-07-25 NOTE — Telephone Encounter (Signed)
[name], I reviewed her downloads >> lets make the following changes: - Lantus 20 units in a.m. and 30 units at night - NovoLog: 10 units before breakfast 8-10 units before lunch (may take 8 with smaller meals) 10 units before dinner Thank you! C

## 2021-07-25 NOTE — Telephone Encounter (Signed)
Patient reports blood sugars dropping low after lunch and below supper despite eating protein and carbs for lunch each day.   Taking Lantus 25u bid, and Novolog  10u acB,and 10u for lunch and supper with sliding scale.   Please advise.   Dexcom downloaded and put on Dr. Charlean Sanfilippo desk

## 2021-07-26 NOTE — Telephone Encounter (Signed)
Patient was notified of the new insulin doses.  She reverbalized the doses correctly, and reports having written them down.

## 2021-07-27 ENCOUNTER — Telehealth: Payer: Self-pay | Admitting: Dietician

## 2021-07-27 NOTE — Telephone Encounter (Signed)
Returned patient call. Patient states that she has been having increased number of low blood glucose events  and feels this at 88.Marland Kitchen Dexcom report reviewed which confirms this.    Patient was not available.  Left message for patient to return my call.  Oran Rein, RD, LDN, CDCES

## 2021-07-31 DIAGNOSIS — R8271 Bacteriuria: Secondary | ICD-10-CM | POA: Diagnosis not present

## 2021-07-31 DIAGNOSIS — N200 Calculus of kidney: Secondary | ICD-10-CM | POA: Diagnosis not present

## 2021-07-31 DIAGNOSIS — N3000 Acute cystitis without hematuria: Secondary | ICD-10-CM | POA: Diagnosis not present

## 2021-08-01 ENCOUNTER — Telehealth: Payer: Self-pay | Admitting: Nutrition

## 2021-08-01 NOTE — Telephone Encounter (Signed)
Note from front desk that patient has called me X 2 saying no one is calling her back.  They said that they transferred the call, but no messages on my machine.  I tried calling and left a voice message to call me.
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
## 2021-08-04 ENCOUNTER — Encounter: Payer: Self-pay | Admitting: Internal Medicine

## 2021-08-14 ENCOUNTER — Other Ambulatory Visit: Payer: Self-pay | Admitting: Internal Medicine

## 2021-08-21 DIAGNOSIS — R8271 Bacteriuria: Secondary | ICD-10-CM | POA: Diagnosis not present

## 2021-08-21 DIAGNOSIS — N3020 Other chronic cystitis without hematuria: Secondary | ICD-10-CM | POA: Diagnosis not present

## 2021-08-24 DIAGNOSIS — F0390 Unspecified dementia without behavioral disturbance: Secondary | ICD-10-CM | POA: Diagnosis not present

## 2021-08-24 DIAGNOSIS — E663 Overweight: Secondary | ICD-10-CM | POA: Diagnosis not present

## 2021-08-24 DIAGNOSIS — I10 Essential (primary) hypertension: Secondary | ICD-10-CM | POA: Diagnosis not present

## 2021-08-24 DIAGNOSIS — Z6828 Body mass index (BMI) 28.0-28.9, adult: Secondary | ICD-10-CM | POA: Diagnosis not present

## 2021-08-24 DIAGNOSIS — F3110 Bipolar disorder, current episode manic without psychotic features, unspecified: Secondary | ICD-10-CM | POA: Diagnosis not present

## 2021-08-24 DIAGNOSIS — E1165 Type 2 diabetes mellitus with hyperglycemia: Secondary | ICD-10-CM | POA: Diagnosis not present

## 2021-08-24 DIAGNOSIS — I700 Atherosclerosis of aorta: Secondary | ICD-10-CM | POA: Diagnosis not present

## 2021-08-25 ENCOUNTER — Ambulatory Visit (INDEPENDENT_AMBULATORY_CARE_PROVIDER_SITE_OTHER): Payer: Medicare Other | Admitting: Internal Medicine

## 2021-08-25 ENCOUNTER — Encounter: Payer: Self-pay | Admitting: Internal Medicine

## 2021-08-25 VITALS — BP 120/68 | HR 110 | Ht 64.0 in | Wt 173.8 lb

## 2021-08-25 DIAGNOSIS — E785 Hyperlipidemia, unspecified: Secondary | ICD-10-CM

## 2021-08-25 DIAGNOSIS — E139 Other specified diabetes mellitus without complications: Secondary | ICD-10-CM

## 2021-08-25 DIAGNOSIS — G63 Polyneuropathy in diseases classified elsewhere: Secondary | ICD-10-CM | POA: Diagnosis not present

## 2021-08-25 NOTE — Progress Notes (Signed)
Patient ID: [name], female   DOB: 01-11-1955, 66 y.o.   MRN: [id number]  HPI: [name] is a 66 y.o.-year-old female, initially referred by her PCP, Dr. Assunta Found (PA: Lenise Herald), returning for f/u for LADA, dx 2005, insulin-dependent since ~2006, controlled, with complications (PN, gastroparesis?).  Last visit 4 months ago.    Interim history: She has bipolar disease  and also has short-term memory loss and disequilibrium.  She sees neurology. She also has anxiety and sees a Veterinary surgeon.  She likes her. She continues to have nausea and increased urination, but no blurry vision, chest pain.  She has dry eyes. Since last visit, she was able to start a Dexcom CGM.  Reviewed history: She was on an Omnipod insulin pump on 08/01/2015. She had severe anxiety and depression and got so overwhelmed with the insulin pump that we had to take her off the pump.  We switched her to a basal-bolus insulin regimen, but she was unable to calculate the insulin doses based on insulin to carb ratio, so now she is using fixed rapid acting insulin doses.  Reviewed HbA1c levels: 04/25/2021: HbA1c calculated from fructosamine is approximately 7.3%, higher than before.  03/2021: Reportedly HbA1c 4.1% in Dr. Sharyon Medicus office. Lab Results  Component Value Date   HGBA1C 5.0 03/03/2020   HGBA1C 5.8 (A) 11/09/2019   HGBA1C 5.7 (A) 06/30/2019  01/24/2021: HbA1c calculated from fructosamine is 6.9%. 10/17/2020: HbA1c calculated from fructosamine is 7.25%. 07/11/2020: HbA1c calculated from fructosamine is 7.3%, slightly higher than before. 04/21/2020: HbA1c calculated from fructosamine has improved to 7.1%. 03/04/2018:  Hba1c calculated from fructosamine is the best in a long time, at 6.84%! 12/19/2017: HbA1c calculated from fructosamine has improved to 7.1%! 09/12/2017: HbA1c  Calculated from the fructosamine is 7.5%, improved from before. 05/07/2017: HbA1c calculated from the fructosamine was 7.7% 02/07/2017:  HbA1c calculated from the fructosamine was higher, at 7.7%. 11/07/2016: HbA1c calc. from the fructosamine is a little lower, at 7.25% 08/07/2016: HbA1c calculated from the fructosamine is 7.5% 05/31/2016: HbA1c calculated from fructosamine is better, at 7.25% 12/15/2015: HbA1c calculated from the fructosamine is higher, 8.4%. 08/24/2015:  HbA1c calculated from fructosamine is 7.4%. 05/24/2015: HbA1c calculated from fructosamine is 6.9%.  She is on: - Metformin 1000 mg with dinner >> 500 mg 2x a day - Lantus  15 units at 3 a.m. and 30 units at bedtime >> 25 units at bedtime and 25 units in am (changed by my colleague since last visit) - NovoLog:  10-12 >> 8-10 units in am 10-12 >> 8-10 units with lunch 10-12 >> 8-10 >> 6-8 units with dinner - Novolog Sliding scale: 201-250: + 2 units 251-300: + 3 units 301-350: + 4 units >350: + 5 units  Pt checks her sugars >4x a day with her Dexcom CGM, started after last visit:  Previously: - am:  71, 74, 130-228, 253, 328 >> 76, 95-219, 236 >> 79-216, 234, 285 >> 68, 90-173, 219, 263 - 2h after b'fast:  149, 229 >> 177, 208, 385 >> 54, 274 >> n/c - before lunch:  45, 54, 72, 75-210, 301, 314 >> 51-170, 222 >> 62-206, 215, 27 >> 70-245, 316 - 2h after lunch: 43 >> n/c >> 45-178, 209 >> 126-162, 354 >> n/c >> 138 >> 98, 77 - before dinner: 65-270 >> 63-187, 204 >> 54, 67-225, 248, 288 >> 87-258, 321 - 2h after dinner: 77-210 >> 265 >> 30, 43-77, 235 >> 84-125, 358 >> 77 - bedtime: 37,41-307, 319,  349 >> 53, 60-231, 231, 311 >> 53-306 - at night: 52, 65 >> 83, 92, 383  >> 34, 45, 79, 124 >> 104-193 >> 83, 136-300 Lowest sugar was 32 >>... 40 >> 53 >> 53; she has hypoglycemia awareness in the 50s Highest sugar was 351 >> 383 >> 358.  No CKD, last BUN/creatinine:  Lab Results  Component Value Date   BUN 19 04/25/2021   CREATININE 0.60 04/25/2021   No microalbuminuria: Lab Results  Component Value Date   MICRALBCREAT 5.4 11/07/2016    +  Dyslipidemia: Lab Results  Component Value Date   CHOL 178 10/17/2020   HDL 45.70 10/17/2020   LDLCALC 100 (H) 10/17/2020   TRIG 165.0 (H) 10/17/2020   CHOLHDL 4 10/17/2020  She was started on Crestor 10 mg daily - now taken off, on Ezetimibe..  -Last eye exam was on 02/2021: No DR reportedly, but dry eyes >> Dr Dione Booze.  -+ Numbness and tingling in her feet.  Also, occasional pain.  She was on Neurontin, but now only on nortriptyline.  These are refilled by PCP.  She is also on alpha-lipoic acid. She sees a podiatrist [facility]).  She had 2 toenails removed in the past due to fungal infection.  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  She has a diagnosis of Parkinson's disease.  She also has bipolar disease. Also: GERD, HTN, fibromyalgia.  Latest TSH was normal: Lab Results  Component Value Date   TSH 0.533 03/03/2020   ROS: + See HPI Neurological: no tremors/+ numbness/+ tingling/no dizziness, + disequilibrium  I reviewed pt's medications, allergies, PMH, social hx, family hx, and changes were documented in the history of present illness. Otherwise, unchanged from my initial visit note.  Past Medical History:  Diagnosis Date   Anxiety    Bipolar 1 disorder (HCC)    Breast density 06/25/2012   Right breast density, will get mammogram and [place]   Depression    Diabetes mellitus without complication (HCC)    type 1   Duodenitis    Fibromyalgia    GERD (gastroesophageal reflux disease)    History of kidney stones    HTN (hypertension)    Hyperlipidemia    IBS (irritable bowel syndrome)    Neuropathy    Sepsis (HCC) 02/27/2019   Past Surgical History:  Procedure Laterality Date   ABDOMINAL HYSTERECTOMY     partial   CHOLECYSTECTOMY     COLONOSCOPY     CYSTOSCOPY WITH RETROGRADE PYELOGRAM, URETEROSCOPY AND STENT PLACEMENT Right 03/31/2019   Procedure: CYSTOSCOPY WITH RETROGRADE PYELOGRAM, URETEROSCOPY AND STENT PLACEMENT;  Surgeon: Noel Christmas,  MD;  Location[facility];  Service: Urology;  Laterality: Right;   CYSTOSCOPY WITH STENT PLACEMENT Right 03/02/2019   Procedure: CYSTOSCOPY WITH STENT PLACEMENT;  Surgeon: Noel Christmas, MD;  Location: [facility] OR;  Service: Urology;  Laterality: Right;   HOLMIUM LASER APPLICATION Right 03/31/2019   Procedure: HOLMIUM LASER APPLICATION;  Surgeon: Noel Christmas, MD;  Location: [facility];  Service: Urology;  Laterality: Right;   UPPER GASTROINTESTINAL ENDOSCOPY     Social History   Socioeconomic History   Marital status: Married    Spouse name: phillip   Number of children: 1   Years of education: 12   Highest education level: High school graduate  Occupational History   Occupation: Disabled  Tobacco Use   Smoking status: Never   Smokeless tobacco: Never  Vaping Use  Vaping Use: Some days   Substances: Flavoring  Substance and Sexual Activity   Alcohol use: No    Alcohol/week: 0.0 standard drinks of alcohol   Drug use: No   Sexual activity: Not Currently  Other Topics Concern   Not on file  Social History Narrative   Lives at home w/ her husband   Left-handed   Caffeine: 1-2 cups of coffee daily   Social Determinants of Health   Financial Resource Strain: Low Risk  (12/07/2016)   Overall Financial Resource Strain (CARDIA)    Difficulty of Paying Living Expenses: Not hard at all  Food Insecurity: No Food Insecurity (12/07/2016)   Hunger Vital Sign    Worried About Running Out of Food in the Last Year: Never true    Ran Out of Food in the Last Year: Never true  Transportation Needs: Unmet Transportation Needs (12/07/2016)   PRAPARE - Administrator, Civil Service (Medical): Yes    Lack of Transportation (Non-Medical): Yes  Physical Activity: Inactive (12/07/2016)   Exercise Vital Sign    Days of Exercise per Week: 0 days    Minutes of Exercise per Session: 0 min  Stress: Not on file  Social Connections: Somewhat Isolated  (12/07/2016)   Social Connection and Isolation Panel [NHANES]    Frequency of Communication with Friends and Family: Never    Frequency of Social Gatherings with Friends and Family: Never    Attends Religious Services: More than 4 times per year    Active Member of [employer] or Organizations: No    Attends Banker Meetings: Never    Marital Status: Married  Catering manager Violence: Not At Risk (12/07/2016)   Humiliation, Afraid, Rape, and Kick questionnaire    Fear of Current or Ex-Partner: No    Emotionally Abused: No    Physically Abused: No    Sexually Abused: No   Current Outpatient Medications on File Prior to Visit  Medication Sig Dispense Refill   acetaminophen (TYLENOL) 500 MG tablet Take 1,000 mg by mouth every 6 (six) hours as needed for mild pain or headache.     ALPRAZolam (XANAX) 1 MG tablet Take 1 mg by mouth 4 (four) times daily as needed for anxiety.      AMBULATORY NON FORMULARY MEDICATION Squatty Potty x 1 1 each 0   amLODipine (NORVASC) 5 MG tablet Take 5 mg by mouth daily.     aspirin EC 81 MG tablet Take 1 tablet (81 mg total) by mouth daily. Swallow whole. 30 tablet 11   BD PEN NEEDLE NANO U/F 32G X 4 MM MISC USE WITH INSULIN PEN FOUR TIMES A DAY 400 each 3   BENICAR 40 MG tablet Take 40 mg by mouth daily.     busPIRone (BUSPAR) 10 MG tablet Take 10 mg by mouth in the morning and at bedtime.     cholecalciferol (VITAMIN D3) 25 MCG (1000 UNIT) tablet Take 1,000 Units by mouth daily.     Continuous Blood Gluc Receiver (DEXCOM G6 RECEIVER) DEVI Use as instructed to check blood sugar. 1 each 0   Continuous Blood Gluc Sensor (DEXCOM G6 SENSOR) MISC Use as instructed to check blood sugar change every 10 days 9 each 3   Continuous Blood Gluc Transmit (DEXCOM G6 TRANSMITTER) MISC Use as instructed to check blood sugar. Change every 90 days 1 each 3   Docusate Sodium (COLACE PO) Take 100 mg by mouth daily.     esomeprazole (NEXIUM) 40 MG  capsule Take 40 mg by mouth  2 (two) times daily.     gabapentin (NEURONTIN) 800 MG tablet Take 800 mg by mouth 3 (three) times daily.     Glucagon (BAQSIMI ONE PACK) 3 MG/DOSE POWD Use 1 spray in the nose as needed for hypoglycemia 1 each PRN   glucose blood (FREESTYLE LITE) test strip USE TO CHECK BLOOD SUGAR 6 TIMES PER DAY 600 each 3   insulin aspart (NOVOLOG FLEXPEN) 100 UNIT/ML FlexPen INJECT 8 TO 10 UNITS UNDER THE SKIN AT THE START OF THE THREE MAIN MEALS AS DIRECTED 30 mL 3   insulin glargine (LANTUS SOLOSTAR) 100 UNIT/ML Solostar Pen INJECT 45 UNITS UNDER THE SKIN DAILY AS ADVISED 30 mL 4   Insulin Syringes, Disposable, U-100 1 ML MISC To use for insulin injection.. 100 each 1   Lancets (FREESTYLE) lancets USE TO TEST BLOOD SUGAR 4 TO 6 TIMES DAILY AS INSTRUCTED 300 each 6   losartan (COZAAR) 25 MG tablet Take 25 mg by mouth daily.     metFORMIN (GLUCOPHAGE) 500 MG tablet TAKE 1 TABLET TWICE A DAY WITH MEALS 180 tablet 3   nortriptyline (PAMELOR) 50 MG capsule Take 50 mg by mouth at bedtime.      ondansetron (ZOFRAN-ODT) 4 MG disintegrating tablet PLACE 1 TO 2 TABLETS ON TONGUE EVERY 4 TO 6 HOURS AS NEEDED FOR NAUSEA (Patient taking differently: Take 4-8 mg by mouth every 4 (four) hours as needed for nausea or vomiting.) 180 tablet 4   OSCIMIN 0.125 MG SUBL DISSOLVE 1 TABLET UNDER THE TONGUE EVERY 4 HOURS AS NEEDED (NEED APPOINTMENT) 360 tablet 0   Probiotic CAPS Take 1 capsule by mouth daily. 30 capsule 1   Propylene Glycol (SYSTANE BALANCE OP) Place 1 drop into both eyes daily as needed (dry eyes).     rosuvastatin (CRESTOR) 10 MG tablet Take 10 mg by mouth daily.     traZODone (DESYREL) 100 MG tablet 1  qhs  May increse to 2  qhs  After 1 week (Patient taking differently: Take 100 mg by mouth at bedtime. 1  qhs  May increse to 2  qhs  After 1 week) 180 tablet 1   venlafaxine (EFFEXOR) 37.5 MG tablet Take 37.5 mg by mouth daily.     vitamin B-12 (CYANOCOBALAMIN) 1000 MCG tablet Take 1,000 mcg by mouth daily.      No current facility-administered medications on file prior to visit.   Allergies  Allergen Reactions   Abilify [Aripiprazole]     Patient is intolerant   Phenergan [Promethazine] Diarrhea and Nausea And Vomiting   Reglan [Metoclopramide] Other (See Comments)    Chest pains   Family History  Problem Relation Age of Onset   Hypertension Mother    Bipolar disorder Mother    Cancer Mother    Depression Father    Cancer Father    Thyroid disease Sister    Hypertension Sister    Bipolar disorder Sister    Heart disease Brother    Bipolar disorder Daughter    Bipolar disorder Maternal Aunt    Colon cancer Neg Hx    Colon polyps Neg Hx    Esophageal cancer Neg Hx    Kidney disease Neg Hx    Gallbladder disease Neg Hx    Diabetes Neg Hx    Dementia Neg Hx    Tremor Neg Hx    Seizures Neg Hx     PE: BP 120/68 (BP Location: Right Arm, Patient Position: Sitting,  Cuff Size: Normal)   Pulse (!) 110   Ht 5\' 4"  (1.626 m)   Wt 173 lb 12.8 oz (78.8 kg)   SpO2 96%   BMI 29.83 kg/m    Wt Readings from Last 3 Encounters:  08/25/21 173 lb 12.8 oz (78.8 kg)  04/25/21 175 lb 3.2 oz (79.5 kg)  02/27/21 173 lb (78.5 kg)   Constitutional: slightly overweight, in NAD Eyes: EOMI, no exophthalmos ENT: moist mucous membranes, no thyromegaly, no cervical lymphadenopathy Cardiovascular: Tachycardia, RR, No MRG Respiratory: CTA B Musculoskeletal: no deformities Skin: moist, warm, + many tatoos Neurological: no tremor with outstretched hands Diabetic Foot Exam - Simple   Simple Foot Form Diabetic Foot exam was performed with the following findings: Yes 08/25/2021 11:02 AM  Visual Inspection No deformities, no ulcerations, no other skin breakdown bilaterally: Yes Sensation Testing Intact to touch and monofilament testing bilaterally: Yes Pulse Check Posterior Tibialis and Dorsalis pulse intact bilaterally: Yes Comments No haluceal toenails    ASSESSMENT: 1. 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  Component     Latest Ref Rng 07/16/2013  C-Peptide     0.80 - 3.90 ng/mL 1.29  Glucose     70 - 99 mg/dL 07/18/2013 (H)  Glutamic Acid Decarb Ab     <=1.0 U/mL 11.3 (H)  Pancreatic Islet Cell Antibody     <5 JDF Units 5 (A)  + anti-pancreatic antibodies >> LADA rather than type 2 DM. She still has a positive C peptide >> still has insulin secretion.  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 HL  PLAN:  1.  Patient with poorly controlled LADA, difficult to manage due to depression/anxiety/GI problems.  Sugars usually fluctuate between 30 and 300s.  In the past, she was on an insulin but she could not continue with it due to anxiety.  She was also on a professional CGM before, but since last visit she was able to start the Dexcom CGM. CGM interpretation: -At today's visit, we reviewed her CGM downloads: It appears that 19% of values are in target range (goal >70%), while 80% are higher than 180 (goal <25%), and 1% are lower than 70 (goal <4%).  The calculated average blood sugar is 234.  The projected HbA1c for the next 3 months (GMI) is 8.9%. -Reviewing the CGM trends, sugars are mostly above he ULN of the target range, mostly in the 200s.  They appear to be worse after changing her Lantus dose and upon questioning, she is afraid of the blood sugars dropping too low and is eating frequently.  I will also suspect that she cannot inject higher doses of NovoLog due to the fact that she has a high amount of Lantus in her system.  Therefore, at today's visit, I advised her to decrease the dose of Lantus in the morning and increase the dose of NovoLog throughout the day.  We discussed about not eating between meals, ideally, or if he has a snack, to bolus insulin for it.  We also discussed about not correcting blood sugars at bedtime unless the sugars are higher  than 300, since she did have some instances in which she dropped too low after correction. - I suggested to:  Patient Instructions  Please continue: - Metformin 500 mg 2x a day with meals  Please change: - Lantus 15 units in am and 25 units at night - NovoLog:  10-12  units in am 10-12 units with lunch 10-12 units with dinner - Novolog Sliding scale: 201-250: + 2 units 251-300: + 3 units 301-350: + 4 units >350: + 5 units If your sugars are <100 and you eat a low carb meal, inject only ~6 units. So, If sugars are <100 before the meals, you still need to take insulin for that meal. Do not correct sugars <300 at bedtime. If sugars are <70 before a meal, bring the sugar up first, then inject insulin for the meals.  Please return in 3-4 months with your sugar log.   - we will check a fructosamine level today - advised to check sugars at different times of the day - 4x a day, rotating check times - advised for yearly eye exams >> she is UTD - return to clinic in 3-4 months  2. PN  -Related to diabetes -She continues on nortriptyline.  She was on gabapentin in the past but this was tapered off. -She is also on alpha lipoic acid 600 mg twice a day; also on Nervine Roll On -In summer 2021, I referred her to physical [facility] at [facility], upon her request, since she had this before with good results.  At previous visits I gave her again the telephone numbers.  She did not start this program.  3. HL -Reviewed latest lipid panel from 10/2020: LDL above target, triglycerides slightly high: Lab Results  Component Value Date   CHOL 178 10/17/2020   HDL 45.70 10/17/2020   LDLCALC 100 (H) 10/17/2020   TRIG 165.0 (H) 10/17/2020   CHOLHDL 4 10/17/2020  -She was started on Crestor 10 mg daily  - now off (she does not remember why), currently on Zetia 10 mg daily.  She tolerates this well.  Carlus Pavlovristina Debbe Crumble, MD PhD [facility]

## 2021-08-25 NOTE — Addendum Note (Signed)
Addended by: Kenyon Ana on: 08/25/2021 11:59 AM   Modules accepted: Orders

## 2021-08-25 NOTE — Patient Instructions (Addendum)
Please continue: - Metformin 500 mg 2x a day with meals  Please change: - Lantus 15 units in am and 25 units at night - NovoLog:  10-12 units in am 10-12 units with lunch 10-12 units with dinner - Novolog Sliding scale: 201-250: + 2 units 251-300: + 3 units 301-350: + 4 units >350: + 5 units If your sugars are <100 and you eat a low carb meal, inject only ~6 units. So, If sugars are <100 before the meals, you still need to take insulin for that meal. Do not correct sugars <300 at bedtime. If sugars are <70 before a meal, bring the sugar up first, then inject insulin for the meals.  Please return in 3-4 months with your sugar log.

## 2021-08-31 LAB — FRUCTOSAMINE: Fructosamine: 414 umol/L — ABNORMAL HIGH (ref 205–285)

## 2021-09-12 DIAGNOSIS — F3181 Bipolar II disorder: Secondary | ICD-10-CM | POA: Diagnosis not present

## 2021-10-03 ENCOUNTER — Other Ambulatory Visit (HOSPITAL_COMMUNITY): Payer: Self-pay | Admitting: Internal Medicine

## 2021-10-03 ENCOUNTER — Ambulatory Visit (HOSPITAL_COMMUNITY)
Admission: RE | Admit: 2021-10-03 | Discharge: 2021-10-03 | Disposition: A | Discharge status: Home / Self Care | Payer: Medicare Other | Source: Ambulatory Visit | Attending: Internal Medicine | Admitting: Internal Medicine

## 2021-10-03 ENCOUNTER — Other Ambulatory Visit: Payer: Self-pay | Admitting: Internal Medicine

## 2021-10-03 DIAGNOSIS — E1165 Type 2 diabetes mellitus with hyperglycemia: Secondary | ICD-10-CM | POA: Diagnosis not present

## 2021-10-03 DIAGNOSIS — R519 Headache, unspecified: Secondary | ICD-10-CM

## 2021-10-03 DIAGNOSIS — F3110 Bipolar disorder, current episode manic without psychotic features, unspecified: Secondary | ICD-10-CM | POA: Diagnosis not present

## 2021-10-03 DIAGNOSIS — Z6828 Body mass index (BMI) 28.0-28.9, adult: Secondary | ICD-10-CM | POA: Diagnosis not present

## 2021-10-03 DIAGNOSIS — I700 Atherosclerosis of aorta: Secondary | ICD-10-CM | POA: Diagnosis not present

## 2021-10-03 DIAGNOSIS — I10 Essential (primary) hypertension: Secondary | ICD-10-CM | POA: Diagnosis not present

## 2021-10-03 DIAGNOSIS — E663 Overweight: Secondary | ICD-10-CM | POA: Diagnosis not present

## 2021-10-03 DIAGNOSIS — F3181 Bipolar II disorder: Secondary | ICD-10-CM | POA: Diagnosis not present

## 2021-10-06 DIAGNOSIS — R519 Headache, unspecified: Secondary | ICD-10-CM | POA: Diagnosis not present

## 2021-10-06 DIAGNOSIS — I10 Essential (primary) hypertension: Secondary | ICD-10-CM | POA: Diagnosis not present

## 2021-10-06 DIAGNOSIS — Z6828 Body mass index (BMI) 28.0-28.9, adult: Secondary | ICD-10-CM | POA: Diagnosis not present

## 2021-10-06 DIAGNOSIS — E6609 Other obesity due to excess calories: Secondary | ICD-10-CM | POA: Diagnosis not present

## 2021-10-06 DIAGNOSIS — E1140 Type 2 diabetes mellitus with diabetic neuropathy, unspecified: Secondary | ICD-10-CM | POA: Diagnosis not present

## 2021-10-06 DIAGNOSIS — F3110 Bipolar disorder, current episode manic without psychotic features, unspecified: Secondary | ICD-10-CM | POA: Diagnosis not present

## 2021-10-10 DIAGNOSIS — Z23 Encounter for immunization: Secondary | ICD-10-CM | POA: Diagnosis not present

## 2021-10-24 DIAGNOSIS — F3181 Bipolar II disorder: Secondary | ICD-10-CM | POA: Diagnosis not present

## 2021-11-02 ENCOUNTER — Encounter: Payer: Self-pay | Admitting: Neurology

## 2021-11-02 ENCOUNTER — Ambulatory Visit (INDEPENDENT_AMBULATORY_CARE_PROVIDER_SITE_OTHER): Payer: Medicare Other | Admitting: Neurology

## 2021-11-02 VITALS — BP 127/74 | HR 91 | Ht 64.0 in | Wt 174.4 lb

## 2021-11-02 DIAGNOSIS — M7918 Myalgia, other site: Secondary | ICD-10-CM | POA: Diagnosis not present

## 2021-11-02 DIAGNOSIS — M5481 Occipital neuralgia: Secondary | ICD-10-CM

## 2021-11-02 NOTE — Patient Instructions (Addendum)
   Occipital Neuralgia is a form of headache that causes pain along the upper neck and back of the head. The pain is in the distribution of the nerves known as occipital nerves/sensory nerves that run from the upper part of the neck to the back of the head. The pain can be throbbing, aching, burning, or can feel sharp and stabbing. Sometimes, this condition is referred to as occipital neuritis. Occipital nerve blocks using an injection of a local anesthetic and a steroid agent may be performed at our office.  If ongoing, call for Occipital Nerve Block Occipital Nerve Block Patient Information  Description: The occipital nerves originate in the cervical (neck) spinal cord and travel upward through muscle and tissue to supply sensation to the back of the head and top of the scalp.  In addition, the nerves control some of the muscles of the scalp.  Occipital neuralgia is an irritation of these nerves which can cause headaches, numbness of the scalp, and neck discomfort.     The occipital nerve block will interrupt nerve transmission through these nerves and can relieve pain and spasm.  The block consists of insertion of a small needle under the skin in the back of the head to deposit local anesthetic (numbing medicine) and/or steroids around the nerve.  The entire block usually lasts less than 5 minutes.  Conditions which may be treated by occipital blocks:  Muscular pain and spasm of the scalp Nerve irritation, back of the head Headaches Upper neck pain  Possible side-effects:  Bleeding from needle site Infection (rare, may require surgery) Nerve injury (rare) Hair on back of neck can be tinged with iodine scrub (this will wash out) Light-headedness (temporary) Pain at injection site (several days) Decreased blood pressure (rare, temporary) Seizure (very rare)  Call if you experience:  Hives or difficulty breathing ( go to the emergency room) Inflammation or drainage at the injection  site(s)  Please note:  Although the local anesthetic injected can often make your painful muscles or headache feel good for several hours after the injection, the pain may return.  It takes 3-7 days for steroids to work.  You may not notice any pain relief for at least one week.  If effective, we will often do a series of injections spaced 3-6 weeks apart to maximally decrease your pain.

## 2021-11-02 NOTE — Progress Notes (Signed)
[name]    Provider:  Dr Jaynee Eagles Requesting Provider: Redmond School, MD Primary Care Provider:  Redmond School, MD  CC:  headaches  11/02/2021: Patient is a 66 year old female who I saw in 2021 and in and in 2018 for different chief complaint of short-term memory loss, fibromyalgia.  She has a past medical history of neuropathy, hyperlipidemia, hypertension, diabetes, depression, bipolar 1, anxiety, abnormality of gait, muscle weakness, lithium toxicity, suicidal ideation.  Polypharmacy was noted. I did not believe there was a neurodegenerative disorder, she had some parkinsonian signs likely due to history of dopaminergic medication but an MRI of the brain and the cervical spine was ordered which showed no etiologies for memory loss or any gait abnormalities, recent CT head was also unremarkable.  Today she is here for headaches.  A thorough review of records shows that she has tried the following for headaches: Tylenol, amitriptyline, amlodipine, desipramine, Prozac, gabapentin, Lamictal, losartan, nortriptyline, Zofran, Phenergan, Effexor, Topamax is contraindicated due to patient's cognitive complaints. The headaches started 4 weeks ago, she woke up with a headache on the left side of the face and neck, swollen lyph noes on the left, they saw Dr. Gerarda Fraction and got meds, feelt like stabbing pain radiating down into the neck. It was like she was having tension. CT scan was negative. It was stabbing, no light or sound sensitivity, no nausea, lasted 4 days and went awy, no headache the last 2 days. Pain was severe, 20 minutes at a time or half a day, was also pulsating, no history of migraine or in the family. She has passed on the couch and passes out with her head to the left. She carries all her tension in her neck and shoulder. Robaxin and the fioricet over a few weeks helped.   CT head 10/03/2021 showed No acute intracranial abnormalities including mass lesion or mass effect,  hydrocephalus, extra-axial fluid collection, midline shift, hemorrhage, or acute infarction, large ischemic events (personally reviewed images)  MRI brain 03/23/2021: personally reviewed MRI images and agree  IMPRESSION: This MRI of the brain with and without contrast shows the following: 1.   No acute findings. 2.   Chronic left cerebellar lacunar infarction, also seen on the 2021 MRI. 3.   Scattered T2/FLAIR hyperintense foci in the subcortical and deep white matter consistent with mild chronic microvascular ischemic change.  None of the foci appear to be acute and there does not appear to be any change compared to the 2021 MRI. 4.   Normal enhancement pattern.  Patient complains of symptoms per HPI as well as the following symptoms: resolved headache . Pertinent negatives and positives per HPI. All others negative   HPI 02/27/2021: Patient is a 66 year old female who I saw in 2021 and in and in 2018 for different chief complaint of short-term memory loss, fibromyalgia.  She has a past medical history of neuropathy, hyperlipidemia, hypertension, diabetes, depression, bipolar 1, anxiety, abnormality of gait, muscle weakness, lithium toxicity, suicidal ideation.  Polypharmacy was noted.  I did not believe there was a neurodegenerative disorder, she had some parkinsonian signs likely due to history of dopaminergic medication but an MRI of the brain and the cervical spine was ordered which showed no etiologies for memory loss or any gait abnormalities, recent CT head was also unremarkable.  I reviewed Dr. Nolon Rod notes, she presented with syncope, abdominal cramps, diabetes and sinusitis, episode of driving with syncope, no seizures or residual, unwitnessed.  Of note I see a diagnosis of "Parkinson's  disease" on her medication list, I am not aware that she has been diagnosed with that or that I diagnosed her with it, at one of my appointment she had some extraparametal symptoms likely due to her  antidopaminergic drugs but she was never diagnosed with Parkinson's disease as far as I know.  I reviewed her blood work which was collected October 2022 which showed CBC with differential unremarkable, CMP showed low glucose at 48, BUN 14, creatinine 0.75 otherwise normal, TSH was normal 1.640.   She will be walking and wake up on the floor x 2. She lost consciousness maybe for a second. She was just walking down the hallway, felt like someone pushed her, not lightheaded, no warning, she remember pain of hitting her head. No urination, no defecation, it happened 6 months ago. And last night, she was walking and started going down and husband tried to catch her, she says she dd NOT lose consciousness, you got right back up, she says she just dropped, no tripping, no imbalance, she doesn't really know why, but never lost consciousness and afterwards no confusion, no alteration of awareness, she wore a monitor and had an echocardiogram. She has dry mouth, she says that is why she feels her throat is closing, she went to ENT, she saw Candee Furbish and per his notes". Normal pump function, mild increased LV wall thickness. Small pericardial effusion - no clinical consequence." She saw Dr. Marlou Porch in Cardiology as recent as 12/2020 and had evaluation. She denies gettting anything stuck in her throat or coughing, if she takes things with water, she feels the muscles in her neck squeeze but no choking or aspiration.   CT head 07/2020 IMPRESSION: Personally reviewed images and agree with the following 1. No acute intracranial abnormalities. 2. 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 3. No evidence for facial bone fracture.  Patient complains of symptoms per HPI as well as the following symptoms: throat spasms . Pertinent negatives and positives per HPI. All others negative   CC:  Memory loss  HPI 7/021:  [name] is a female here as requested by Redmond School, MD for short term memory  loss.  Past medical history neuropathy, hyperlipidemia, hypertension, diabetes, depression, bipolar 1 disorder, anxiety, abnormality of gait, muscle weakness, lithium toxicity, suicidal ideation.  She was seen in neurology over 3 years ago, at that time she denied any issues, I reviewed those notes, she had some memory issues but felt this was age-related due to lots of medications and bipolar disorder, she reported repeating things, denied getting lost, has been paying the bills and he had to take over 10 years ago after developing bipolar disorder, no alcohol or past drug use other than marijuana, she writes things down a lot and that helps her remember appointments, she denied problems cooking and no accidents in the home.  At that time her MMSE was 28 out of 30.  Examination was unremarkable.  She was asked to follow-up in several months but never did.  Neurologic examination at that time in 2018 was nonfocal.  It appears she was recently admitted to [facility] in March of this year, discharge diagnosis urosepsis secondary to E. coli bacteremia in the setting of obstructing nephrolithiasis.  She is on multiple medications that can cause memory loss such as desipramine, gabapentin, benzodiazepines, hydrocodone, meclizine, nortriptyline, trazodone per a review of hospital discharge summary.   I also reviewed notes from Dr. Gerarda Fraction, last B12 was 347 May 06, 2019, at the same time white blood cells were normal, sodium was normal, potassium normal, platelets normal, hemoglobin 10.5 slightly low, glucose was elevated 194, folate was normal 16.3, ferritin was low at 7, creatinine 0.68, EGFR 91 mL/min normal, BUN 25, AST/ALT/alk phos normal.  On May 06, 2019, it appears at that time she had a urine infection based on the labs, she reported 6 falls in the prior year, at that time she was on amitriptyline, alprazolam, desipramine, nortriptyline, gabapentin, meclizine, trazodone (and other medication) which are  medications that can interfere with memory.  Dr. Rosaura Carpenter goes exam showed a well-appearing well-nourished patient in no distress, normal eyes, neck, heart, lungs, extremities, focused neurologic and for psychiatric evaluation he stated intact memory, judgment and insight, normal mood and affect.  Patient is here with her husband today who also provides much information.  She has short-term memory loss, she had several falls, Dr. Gerarda Fraction was concerned that there may be "pressure on the brain", husband states the patient gets rattled sometimes but the patient states lots of people have short-term memory loss, 6 falls in the past year, today MMSE 21 out of 30 with 10 animals. She states she does have more short-term memory loss, she can't retain many things, but she remember remote details, she denies dementia in her 69 year old mother but states "they were all crazy" and mother was violent at the end. Her father took amitriptyline for many years (not sure why she states this), husband says "he doesn't want me to say a lot". He says her whole family has depression, brothers and sisters and parents extended family with mood disorders and extensive psychiatric disease, she has a tendency to set things down and can't find it, she will spend an hour trying to find it, her brain "doesn't hold everything", patient was confused about why she was here today thought it was for follow up for her kidney infection. Husband has paid all the bills for 10 years, she has impulsive spending, she can go to the grocery and fine paying or at a restaurant, she takes her own medications, she rarely misses her meds (husband agrees), she says she feels angry sometimes, she wants to "talk crap to everyone" but husband says she has been nicer the last few months and she contributes it to colace and resolution of constipation. Husband is more concerned with her aggravation. She has not fallen for 2 months, she fell backwards once when getting out  of the car, once she fell in the hallway, husband says it has been glucose and blood pressure dropping. Husband says once she got tangled in her feet and if she gets up too fast her head with "swim" and she gets vertigo, since getting her blood pressure under control and working on diet she has not fallen.  She states that over the years she has been on multiple anti-psychotics such as abilify and zyprexa. Has not been to PT.   Reviewed notes, labs and imaging from outside physicians, which showed:  In March 2018 HIV, B12, MMA, RPR were all unremarkable.  CT head 2020 showed No acute intracranial abnormalities including mass lesion or mass effect, hydrocephalus, extra-axial fluid collection, midline shift, hemorrhage, or acute infarction, large ischemic events (personally reviewed images).  Her ventricles were normal, I did not appreciate any hydrocephalus or enlargement of the ventricles.  Review of Systems: Patient complains of symptoms per HPI as well as the following symptoms depression. Pertinent negatives and positives per HPI. All  others negative.   Social History   Socioeconomic History   Marital status: Married    Spouse name: phillip   Number of children: 1   Years of education: 12   Highest education level: High school graduate  Occupational History   Occupation: Disabled  Tobacco Use   Smoking status: Never   Smokeless tobacco: Never  Vaping Use   Vaping Use: Some days   Substances: Flavoring  Substance and Sexual Activity   Alcohol use: No    Alcohol/week: 0.0 standard drinks of alcohol   Drug use: No   Sexual activity: Not Currently  Other Topics Concern   Not on file  Social History Narrative   Lives at home w/ her husband   Left-handed   Caffeine: 1-2 cups of coffee daily   Social Determinants of Health   Financial Resource Strain: Low Risk  (12/07/2016)   Overall Financial Resource Strain (CARDIA)    Difficulty of Paying Living Expenses: Not hard at all   Food Insecurity: No Food Insecurity (12/07/2016)   Hunger Vital Sign    Worried About Running Out of Food in the Last Year: Never true    Ran Out of Food in the Last Year: Never true  Transportation Needs: Unmet Transportation Needs (12/07/2016)   PRAPARE - Transportation    Lack of Transportation (Medical): Yes    Lack of Transportation (Non-Medical): Yes  Physical Activity: Inactive (12/07/2016)   Exercise Vital Sign    Days of Exercise per Week: 0 days    Minutes of Exercise per Session: 0 min  Stress: Not on file  Social Connections: Somewhat Isolated (12/07/2016)   Social Connection and Isolation Panel [NHANES]    Frequency of Communication with Friends and Family: Never    Frequency of Social Gatherings with Friends and Family: Never    Attends Religious Services: More than 4 times per year    Active Member of [employer] or Organizations: No    Attends Archivist Meetings: Never    Marital Status: Married  Human resources officer Violence: Not At Risk (12/07/2016)   Humiliation, Afraid, Rape, and Kick questionnaire    Fear of Current or Ex-Partner: No    Emotionally Abused: No    Physically Abused: No    Sexually Abused: No    Family History  Problem Relation Age of Onset   Hypertension Mother    Bipolar disorder Mother    Cancer Mother    Depression Father    Cancer Father    Thyroid disease Sister    Hypertension Sister    Bipolar disorder Sister    Heart disease Brother    Bipolar disorder Daughter    Bipolar disorder Maternal Aunt    Colon cancer Neg Hx    Colon polyps Neg Hx    Esophageal cancer Neg Hx    Kidney disease Neg Hx    Gallbladder disease Neg Hx    Diabetes Neg Hx    Dementia Neg Hx    Tremor Neg Hx    Seizures Neg Hx     Past Medical History:  Diagnosis Date   Anxiety    Bipolar 1 disorder ([place])    Breast density 06/25/2012   Right breast density, will get mammogram and [place]   Depression    Diabetes mellitus without complication ([place])     type 1   Duodenitis    Fibromyalgia    GERD (gastroesophageal reflux disease)    History of kidney stones    HTN (  hypertension)    Hyperlipidemia    IBS (irritable bowel syndrome)    Neuropathy    Sepsis ([place]) 02/27/2019    Patient Active Problem List   Diagnosis Date Noted   Syncope and collapse 12/15/2020   Pure hypercholesterolemia 12/15/2020   Diabetic polyneuropathy ([place]) 03/03/2020   GERD without esophagitis 03/03/2020   Anxiety state    Acute pyelonephritis    Peripheral neuropathy 02/07/2017   Chronic constipation 05/13/2016   Suicide ideation 04/15/2012   LADA (latent autoimmune diabetes in adults), managed as type 1 ([place])    IBS (irritable bowel syndrome)    Essential hypertension    Bipolar 1 disorder ([place])    Abnormality of gait 11/07/2011    Past Surgical History:  Procedure Laterality Date   ABDOMINAL HYSTERECTOMY     partial   CHOLECYSTECTOMY     COLONOSCOPY     CYSTOSCOPY WITH RETROGRADE PYELOGRAM, URETEROSCOPY AND STENT PLACEMENT Right 03/31/2019   Procedure: CYSTOSCOPY WITH RETROGRADE PYELOGRAM, URETEROSCOPY AND STENT PLACEMENT;  Surgeon: Robley Fries, MD;  Location: [facility];  Service: Urology;  Laterality: Right;   CYSTOSCOPY WITH STENT PLACEMENT Right 03/02/2019   Procedure: CYSTOSCOPY WITH STENT PLACEMENT;  Surgeon: Robley Fries, MD;  Location: [facility];  Service: Urology;  Laterality: Right;   HOLMIUM LASER APPLICATION Right 2/37/6283   Procedure: HOLMIUM LASER APPLICATION;  Surgeon: Robley Fries, MD;  Location: [facility];  Service: Urology;  Laterality: Right;   UPPER GASTROINTESTINAL ENDOSCOPY      Current Outpatient Medications  Medication Sig Dispense Refill   acetaminophen (TYLENOL) 500 MG tablet Take 1,000 mg by mouth every 6 (six) hours as needed for mild pain or headache.     ALPRAZolam (XANAX) 1 MG tablet Take 1 mg by mouth 4 (four) times daily as needed for anxiety.      AMBULATORY NON  FORMULARY MEDICATION Squatty Potty x 1 1 each 0   amitriptyline (ELAVIL) 25 MG tablet Take 25 mg by mouth at bedtime.     amLODipine (NORVASC) 5 MG tablet Take 5 mg by mouth daily.     aspirin EC 81 MG tablet Take 1 tablet (81 mg total) by mouth daily. Swallow whole. 30 tablet 11   BD PEN NEEDLE NANO U/F 32G X 4 MM MISC USE WITH INSULIN PEN FOUR TIMES A DAY 400 each 3   BENICAR 40 MG tablet Take 40 mg by mouth daily.     busPIRone (BUSPAR) 10 MG tablet Take 10 mg by mouth in the morning and at bedtime.     cholecalciferol (VITAMIN D3) 25 MCG (1000 UNIT) tablet Take 1,000 Units by mouth daily.     Continuous Blood Gluc Receiver (DEXCOM G6 RECEIVER) DEVI Use as instructed to check blood sugar. 1 each 0   Continuous Blood Gluc Sensor (DEXCOM G6 SENSOR) MISC Use as instructed to check blood sugar change every 10 days 9 each 3   Continuous Blood Gluc Transmit (DEXCOM G6 TRANSMITTER) MISC Use as instructed to check blood sugar. Change every 90 days 1 each 3   Docusate Sodium (COLACE PO) Take 100 mg by mouth daily.     esomeprazole (NEXIUM) 40 MG capsule Take 40 mg by mouth 2 (two) times daily.     ezetimibe (ZETIA) 10 MG tablet Take 10 mg by mouth daily.     gabapentin (NEURONTIN) 800 MG tablet Take 800 mg by mouth 3 (three) times daily.     Glucagon (BAQSIMI ONE PACK) 3  MG/DOSE POWD Use 1 spray in the nose as needed for hypoglycemia 1 each PRN   glucose blood (FREESTYLE LITE) test strip USE TO CHECK BLOOD SUGAR 6 TIMES PER DAY 600 each 3   insulin aspart (NOVOLOG FLEXPEN) 100 UNIT/ML FlexPen INJECT 8 TO 10 UNITS UNDER THE SKIN AT THE START OF THE THREE MAIN MEALS AS DIRECTED 30 mL 3   insulin glargine (LANTUS SOLOSTAR) 100 UNIT/ML Solostar Pen INJECT 45 UNITS UNDER THE SKIN DAILY AS ADVISED 30 mL 4   Insulin Syringes, Disposable, U-100 1 ML MISC To use for insulin injection.. 100 each 1   Lancets (FREESTYLE) lancets USE TO TEST BLOOD SUGAR 4 TO 6 TIMES DAILY AS INSTRUCTED 300 each 6   losartan  (COZAAR) 25 MG tablet Take 25 mg by mouth daily.     metFORMIN (GLUCOPHAGE) 500 MG tablet TAKE 1 TABLET TWICE A DAY WITH MEALS 180 tablet 3   nortriptyline (PAMELOR) 50 MG capsule Take 50 mg by mouth at bedtime.      ondansetron (ZOFRAN-ODT) 4 MG disintegrating tablet PLACE 1 TO 2 TABLETS ON TONGUE EVERY 4 TO 6 HOURS AS NEEDED FOR NAUSEA (Patient taking differently: Take 4-8 mg by mouth every 4 (four) hours as needed for nausea or vomiting.) 180 tablet 4   OSCIMIN 0.125 MG SUBL DISSOLVE 1 TABLET UNDER THE TONGUE EVERY 4 HOURS AS NEEDED (NEED APPOINTMENT) 360 tablet 0   Probiotic CAPS Take 1 capsule by mouth daily. 30 capsule 1   Propylene Glycol (SYSTANE BALANCE OP) Place 1 drop into both eyes daily as needed (dry eyes).     traZODone (DESYREL) 100 MG tablet 1  qhs  May increse to 2  qhs  After 1 week (Patient taking differently: Take 100 mg by mouth at bedtime. 1  qhs  May increse to 2  qhs  After 1 week) 180 tablet 1   venlafaxine (EFFEXOR) 37.5 MG tablet Take 37.5 mg by mouth daily.     vitamin B-12 (CYANOCOBALAMIN) 1000 MCG tablet Take 1,000 mcg by mouth daily.     No current facility-administered medications for this visit.    Allergies as of 11/02/2021 - Review Complete 08/25/2021  Allergen Reaction Noted   Abilify [aripiprazole]  08/10/2016   Phenergan [promethazine] Diarrhea and Nausea And Vomiting 07/08/2019   Reglan [metoclopramide] Other (See Comments) 03/23/2013    Vitals: There were no vitals taken for this visit. Last Weight:  Wt Readings from Last 1 Encounters:  08/25/21 173 lb 12.8 oz (78.8 kg)   Last Height:   Ht Readings from Last 1 Encounters:  08/25/21 _0  (1.626 m)     Physical exam: Exam: Gen: NAD, very conversant, well nourised, well groomed    CV: RRR, no MRG. No Carotid Bruits. No peripheral edema, warm, nontender Eyes: Conjunctivae clear without exudates or hemorrhage  Neuro: Detailed Neurologic Exam  Speech:    Speech is normal; fluent and  spontaneous with normal comprehension.  Cognition:     Cranial Nerves:     The pupils are equal, round, and reactive to light. Attempted fundoscopy could not visualize.  Visual fields are full to finger confrontation. Extraocular movements are intact. Trigeminal sensation is intact and the muscles of mastication are normal. The face is symmetric. The palate elevates in the midline. Hearing intact. Voice is normal. Shoulder shrug is normal. The tongue has normal motion without fasciculations.   Coordination:   Normal finger to nose and heel to shin.  Gait: erect posture, slightly low  clearance but not shuffling, slightly decreased arm swing, external rotation of feet (Stable no progression since last being seen in 2021). Can heel and toe walk. Imbalance with tandem.   Motor Observation:    No asymmetry, no atrophy, and no involuntary movements noted. Tone:    Normal muscle tone.    Posture:    Posture is normal. normal erect    Strength:    Strength is V/V in the upper and lower limbs.      Sensation: intact to LT     Reflex Exam:  DTR's: absent AJs otherwise deep tendon reflexes in the upper and lower extremities are brisk bilaterally.   Toes:    The toes are equiv bilaterally.   Clonus:    Clonus is absent.    Assessment/Plan:  Wanless is a 66 y.o. female here as requested by Redmond School, MD for episode of what appears to be limited occipital neuritis, resolved.  we have seen her in the past for multiple symptoms.   Past medical history neuropathy, hyperlipidemia, hypertension, diabetes, depression, bipolar 1 disorder, anxiety, abnormality of gait, muscle weakness, lithium toxicity, suicidal ideation."I have no hope" she states she she has ongoing depression and anxiety, FHx extensive psychiatric disease, unknown FHx of dementia. Tight cervical muscles on exam, some tenderness at the left cervico-occipital junction.   - had what sounds like an episode of occipital neuritis after  waking up, may have slept wrong on her neck, resolved the last few days, but risk factors include posture, way she sleeps, falling asleep in a chair, very tight cervical muscles. I would focus on PT for cervical myofascial pain and having dry needling done. Also heat and topical voltaren.   - if happens again call us and we can see if we can perform a nerve block  - Send to PT:  Physical Therapy: Cervical myofascial pain, posture contributing to cervicalgia and occipital neuritis. Please evaluate and treat including dry needling, stretching, strengthening, manual therapy/massage, heating, TENS unit, as clinically warranted as well as any other modality as recommended by evaluation. Walked them over next door to help with facilitation of appointment.    Prior assessment and Plan 02/2021 - Prior Syncope: Very unlikely seizures. May be slightly orthostatic (see below). Will order EEG and MRI brain to evaluate for seizure focus, strokes. But I suspect etiology is not primary neurologic. She should be on ASA daily due to prior stroke seen on MRI. Goal LDL < 70. Manage all vascular risk factors with primary care. Discussed stroke prevention. Will repeat MRI brain.   - Her vitals signs went from 166/101 93 laying to 153/91 p 100 sitting to 143/86 p 106 appears orthostatic but she was not dizzy, still possibly this is a trigger if the syncope bc happens when she is always upright. She saw Dr. Marlou Porch in Cardiology as recent as 12/2020 and had evaluation.   - She feels her throat closing up, squeezing, hard to swallow pills. She denies coughing, if she takes things with water its better (may be dry mouth due to polypharmacy), she feels the muscles in her neck squeeze but no choking or aspiration. I recommended a swallow evaluation but she declines then she accepts, will order. Also recommend ENT evaluation per primary care as warranted, Dr. Gerarda Fraction.   - Of note I see a diagnosis of "Parkinson's disease" on her  medication list, I am not aware that she has been diagnosed with that or that I diagnosed her with it, at one  of my appointment she had some extrapyramidal symptoms(Parkinsonian does not mean Parkinson's disease) likely due to her antidopaminergic drugs but she was never diagnosed with Parkinson's disease as far as I know and not by me and I do NOT think she has parkinson's disease. Today her exam and gait is stable, no progression since 2021. NOT parkinson's disease.   - have seen her in the past for short term memory loss, I ordered formal memory testing and does not appear that was completed. I do not suspect a neurodegenerative disease, her neurologic exam including mental status is stable.   - Se declines PT for imbalance  No orders of the defined types were placed in this encounter.     PRIOR ASSESSMENT AND PLAN FOR SHORT TERM MEMORY LOSS 07/2019: -This is a patient with an extensive history of psychiatric disorders and medications.  I saw her 3 years ago for memory loss and today she is here for the same.  Mini-Mental status was 28 out of 30 several years ago today is 21 out of 30.  However I think it will be very difficult to differentiate between psychiatric disease and/or neurodegenerative brain disorder in this patient.  We will send her to formal memory testing and see if our colleagues can help [place] sort this out. -She has been on multiple dopaminergic blocking agents in the past and I think this may account for some mild parkinsonian traits that she has on examination such as her gait with low clearance, hypomimia, decreased arm swing.  I will get an MRI of the brain and cervical spine to ensure that she does not have NPH or other disorder such as cervical stenosis with myelopathy given her very brisk reflexes and falls.  But I think she might have some parkinsonian traits secondary to dopamine blocking agents in the past/antipsychotic medications which can be the cause of gait disorder and  falls. Recommend PT.  Formal memory testing   Cc: Redmond School, MD,    Sarina Ill, MD  [name] [address] [place], [place] [address] Phone [phone] Fax [phone]  I spent over 30 minutes of face-to-face and non-face-to-face time with patient on the  1. Cervical myofascial pain syndrome   2. Occipital neuritis    diagnosis.  This included previsit chart review, lab review, study review, order entry, electronic health record documentation, patient education on the different diagnostic and therapeutic options, counseling and coordination of care, risks and benefits of management, compliance, or risk factor reduction

## 2021-11-04 ENCOUNTER — Encounter: Payer: Self-pay | Admitting: Internal Medicine

## 2021-11-06 DIAGNOSIS — H1045 Other chronic allergic conjunctivitis: Secondary | ICD-10-CM | POA: Diagnosis not present

## 2021-11-06 DIAGNOSIS — H0102B Squamous blepharitis left eye, upper and lower eyelids: Secondary | ICD-10-CM | POA: Diagnosis not present

## 2021-11-06 DIAGNOSIS — H04123 Dry eye syndrome of bilateral lacrimal glands: Secondary | ICD-10-CM | POA: Diagnosis not present

## 2021-11-06 DIAGNOSIS — H25813 Combined forms of age-related cataract, bilateral: Secondary | ICD-10-CM | POA: Diagnosis not present

## 2021-11-06 DIAGNOSIS — H0102A Squamous blepharitis right eye, upper and lower eyelids: Secondary | ICD-10-CM | POA: Diagnosis not present

## 2021-11-13 ENCOUNTER — Encounter: Payer: Self-pay | Admitting: Physical Therapy

## 2021-11-13 ENCOUNTER — Other Ambulatory Visit: Payer: Self-pay

## 2021-11-13 ENCOUNTER — Ambulatory Visit (INDEPENDENT_AMBULATORY_CARE_PROVIDER_SITE_OTHER): Payer: Medicare Other | Admitting: Physical Therapy

## 2021-11-13 DIAGNOSIS — R293 Abnormal posture: Secondary | ICD-10-CM

## 2021-11-13 DIAGNOSIS — M542 Cervicalgia: Secondary | ICD-10-CM | POA: Diagnosis not present

## 2021-11-13 DIAGNOSIS — M6281 Muscle weakness (generalized): Secondary | ICD-10-CM

## 2021-11-13 DIAGNOSIS — R2689 Other abnormalities of gait and mobility: Secondary | ICD-10-CM

## 2021-11-13 NOTE — Therapy (Signed)
OUTPATIENT PHYSICAL THERAPY CERVICAL EVALUATION   Patient Name: [name] MRN: [id number] DOB:1955-01-23, 66 y.o., female Today's Date: 11/13/2021   PT End of Session - 11/13/21 1004     Visit Number 1    Number of Visits 15    Date for PT Re-Evaluation 02/05/22    Authorization Type MCR and tricare    PT Start Time 0930    PT Stop Time 1014    PT Time Calculation (min) 44 min    Activity Tolerance Patient tolerated treatment well    Behavior During Therapy WFL for tasks assessed/performed             Past Medical History:  Diagnosis Date   Anxiety    Bipolar 1 disorder (HCC)    Breast density 06/25/2012   Right breast density, will get mammogram and [place]   Depression    Diabetes mellitus without complication (HCC)    type 1   Duodenitis    Fibromyalgia    GERD (gastroesophageal reflux disease)    History of kidney stones    HTN (hypertension)    Hyperlipidemia    IBS (irritable bowel syndrome)    Neuropathy    Sepsis (HCC) 02/27/2019   Sepsis (HCC) 2023   Past Surgical History:  Procedure Laterality Date   ABDOMINAL HYSTERECTOMY     partial   CHOLECYSTECTOMY     COLONOSCOPY     CYSTOSCOPY WITH RETROGRADE PYELOGRAM, URETEROSCOPY AND STENT PLACEMENT Right 03/31/2019   Procedure: CYSTOSCOPY WITH RETROGRADE PYELOGRAM, URETEROSCOPY AND STENT PLACEMENT;  Surgeon: Noel Christmas, MD;  Location: [facility];  Service: Urology;  Laterality: Right;   CYSTOSCOPY WITH STENT PLACEMENT Right 03/02/2019   Procedure: CYSTOSCOPY WITH STENT PLACEMENT;  Surgeon: Noel Christmas, MD;  Location: [facility] OR;  Service: Urology;  Laterality: Right;   HOLMIUM LASER APPLICATION Right 03/31/2019   Procedure: HOLMIUM LASER APPLICATION;  Surgeon: Noel Christmas, MD;  Location: [facility];  Service: Urology;  Laterality: Right;   UPPER GASTROINTESTINAL ENDOSCOPY     Patient Active Problem List   Diagnosis Date Noted   Syncope and collapse 12/15/2020    Pure hypercholesterolemia 12/15/2020   Diabetic polyneuropathy (HCC) 03/03/2020   GERD without esophagitis 03/03/2020   Anxiety state    Acute pyelonephritis    Peripheral neuropathy 02/07/2017   Chronic constipation 05/13/2016   Suicide ideation 04/15/2012   LADA (latent autoimmune diabetes in adults), managed as type 1 (HCC)    IBS (irritable bowel syndrome)    Essential hypertension    Bipolar 1 disorder (HCC)    Abnormality of gait 11/07/2011    PCP: Elfredia Nevins, MD   REFERRING PROVIDER: Anson Fret, MD  REFERRING DIAG: M79.18 (ICD-10-CM) - Cervical myofascial pain syndrome M54.81 (ICD-10-CM) - Occipital neuritis  THERAPY DIAG:  Cervicalgia  Abnormal posture  Muscle weakness (generalized)  Other abnormalities of gait and mobility  Rationale for Evaluation and Treatment: Rehabilitation  ONSET DATE: 2 week onset of pain  SUBJECTIVE:  SUBJECTIVE STATEMENT: She says 2 week onset of pain and frequent headaches with insidious onset. She has been feeling dizzy, lightheaded, and has fallen 3 times in past month. She admits to depression and taking lots of different meds. She feels she has short term memory loss. She was checked out by cardiology and neurologist and then was referred to PT. She denies N/T down left arm but has constant N/T in her hands due to diabetic neuropathy.   PERTINENT HISTORY:  past medical history of neuropathy, hyperlipidemia, hypertension, diabetes, depression, bipolar 1, anxiety, abnormality of gait, muscle weakness, lithium toxicity, suicidal ideation.  Polypharmacy was noted.  PAIN:  Are you having pain? Yes: NPRS scale: 5 currently but can get up to 10/10 Pain location: left side of neck   Pain description: pulsing pain, that goes from her  head, to her ear and into left upper trap Aggravating factors: turning her head, it gets bad if she cries Relieving factors: ice, heat  PRECAUTIONS: Fall  WEIGHT BEARING RESTRICTIONS: No  FALLS:  Has patient fallen in last 6 months? Yes. Number of falls 3  OCCUPATION: none  PLOF: Independent with household mobility with device  PATIENT GOALS: reduce pain  NEXT MD VISIT:   OBJECTIVE:   DIAGNOSTIC FINDINGS:  IMPRESSION: This MRI of the brain with and without contrast shows the following: 1.   No acute findings. 2.   Chronic left cerebellar lacunar infarction, also seen on the 2021 MRI. 3.   Scattered T2/FLAIR hyperintense foci in the subcortical and deep white matter consistent with mild chronic microvascular ischemic change.  None of the foci appear to be acute and there does not appear to be any change compared to the 2021 MRI. 4.   Normal enhancement pattern.  PATIENT SURVEYS:  Eval: FOTO 35% functional, goal is 54%  COGNITION: Overall cognitive status: Within functional limits for tasks assessed  SENSATION: WFL  POSTURE: rounded shoulders and forward head  PALPATION: Tender to palpation with trigger points in left cervical P.S, levator, upper trap    CERVICAL ROM:   Active ROM A/PROM (deg) eval  Flexion 30  Extension 20  Right lateral flexion 10  Left lateral flexion 10  Right rotation 47  Left rotation 50   (Blank rows = not tested)  UPPER EXTREMITY ROM:  Active ROM Right eval Left eval  Shoulder flexion 150 150  Shoulder extension    Shoulder abduction    Shoulder adduction    Shoulder extension    Shoulder internal rotation    Shoulder external rotation    Elbow flexion    Elbow extension    Wrist flexion    Wrist extension    Wrist ulnar deviation    Wrist radial deviation    Wrist pronation    Wrist supination     (Blank rows = not tested)  UPPER EXTREMITY MMT:  MMT Right eval Left eval  Shoulder flexion 4 4  Shoulder extension     Shoulder abduction 4 4  Shoulder adduction    Shoulder extension    Shoulder internal rotation 5 5  Shoulder external rotation 4+ 4+  Middle trapezius    Lower trapezius    Elbow flexion 5 5  Elbow extension 4 4  Wrist flexion    Wrist extension    Wrist ulnar deviation    Wrist radial deviation    Wrist pronation    Wrist supination    Grip strength     (Blank rows = not tested)  CERVICAL SPECIAL TESTS:  Spurling's test: Negative  FUNCTIONAL TESTS:    TODAY'S TREATMENT:  Eval HEP creation and review, see below for details IFC TENS to neck and left shoulder with cold pack X 15 min   PATIENT EDUCATION: Education details: HEP, PT plan of care, TENS unit recommendation and print out Person educated: Patient Education method: Explanation, Demonstration, Verbal cues, and Handouts Education comprehension: verbalized understanding and needs further education   HOME EXERCISE PROGRAM: Created on HEP 2 go as medbridge was not working CERVICAL RETRACTION / CHIN TUCK  Slowly draw your head back so that your ears line up with your shoulders.  Video # [id number]  Repeat 10 Times Hold 1 Second Complete 1 Set Perform 3 Times a Day  UPPER TRAP STRETCH - HAND BEHIND BACK AND TOP OF HEAD  Begin by retracting your head back into a chin tuck position. Next, place one hand behind your back and gently pull your head towards the opposite side with the help of your other arm.  Video # [id number]  Repeat 5 Times Hold 10 Seconds Complete 1 Set Perform 3 Times a Day  CERVICAL TOWEL ROTATION STRETCH  Hold the ends of a small folded bath towel and wrap it around your head and neck as shown. Place the towel on your face so as to minimize placing pressure on your jaw. Pressure should be placed on the side of your face/cheek bone.  Use your bottom most arm to anchor the towel in place. Use your top most arm to pull the towel to cause a gentle rotational stretch in your neck. Hold, then  return to starting position and repeat.   Video # [id number]  Repeat 10 Times Hold 5 Seconds Complete 1 Set Perform 3 Times a Day  Scapular Retractions  Sitting on solid, supportive surface. Extend arms out and place hands on surface behind and to the sides of hips. Push down into hands as if you were attempting to lift buttocks from surface. Focus on pulling shoulder blades down and in as if you were trying to touch them in the center of your back. Avoid raising shoulders up to ears.  ASSESSMENT:  CLINICAL IMPRESSION: Patient referred to PT for Myofascial cervical muscle pain and left-sided occipital neuritis, and abnormal. MD recommending evaluate and treat including dry needling, stretching, strengthening, manual therapy/massage, heating, TENS unit  as clinically warranted as well as any other modality as recommended by evaluation. She has had several falls and would benefit from balance/gait training as well. Patient will benefit from skilled PT to address below impairments, limitations and improve overall function.  OBJECTIVE IMPAIRMENTS: decreased activity tolerance, decreased shoulder mobility, decreased ROM, decreased strength, impaired flexibility, impaired UE use, postural dysfunction, and pain.  ACTIVITY LIMITATIONS: reaching, lifting, carry,  cleaning, driving, and or occupation  PERSONAL FACTORS: past medical history of neuropathy, hyperlipidemia, hypertension, diabetes, depression, bipolar 1, anxiety, abnormality of gait, muscle weakness, lithium toxicity, suicidal ideation.  Polypharmacy was noted. also affecting patient's functional outcome.  REHAB POTENTIAL: Good  CLINICAL DECISION MAKING: moderate  EVALUATION COMPLEXITY: moderate    GOALS: Short term PT Goals Target date: 12/11/2021 Pt will be I and compliant with HEP. Baseline:  Goal status: New Pt will decrease pain by 25% overall Baseline: Goal status: New  Long term PT goals Target date: 02/05/2022 Pt will  improve neck AROM to [facility] to improve functional reaching Baseline: Goal status: New Pt will improve FOTO to at least 54% functional to show improved function Baseline:  Goal status: New Pt will reduce pain to overall less than 3/10 with usual activity and turning her head. Baseline: Goal status: New  PLAN: PT FREQUENCY: 1-2 times per week   PT DURATION: 12 weeks  PLANNED INTERVENTIONS (unless contraindicated): aquatic PT, Canalith repositioning, cryotherapy, Electrical stimulation, Iontophoresis with 4 mg/ml dexamethasome, Moist heat, traction, Ultrasound, gait training, Therapeutic exercise, balance training, neuromuscular re-education, patient/family education, prosthetic training, manual techniques, passive ROM, dry needling, taping, vasopnuematic device, vestibular, spinal manipulations, joint manipulations  PLAN FOR NEXT SESSION: review HEP, how was TENS last time? consider manual therapy, modalities, DN. Needs postural corrective exercises, neck mobility, eventually gait/balance    April Manson, PT,DPT 11/13/2021, 10:15 AM

## 2021-11-16 ENCOUNTER — Encounter: Payer: Self-pay | Admitting: Internal Medicine

## 2021-11-17 NOTE — Therapy (Unsigned)
OUTPATIENT PHYSICAL THERAPY TREATMENT NOTE   Patient Name: [name] MRN: [id number] DOB:1955-11-23, 66 y.o., female Today's Date: 11/17/2021  END OF SESSION:    Past Medical History:  Diagnosis Date   Anxiety    Bipolar 1 disorder (HCC)    Breast density 06/25/2012   Right breast density, will get mammogram and [place]   Depression    Diabetes mellitus without complication (HCC)    type 1   Duodenitis    Fibromyalgia    GERD (gastroesophageal reflux disease)    History of kidney stones    HTN (hypertension)    Hyperlipidemia    IBS (irritable bowel syndrome)    Neuropathy    Sepsis (HCC) 02/27/2019   Sepsis (HCC) 2023   Past Surgical History:  Procedure Laterality Date   ABDOMINAL HYSTERECTOMY     partial   CHOLECYSTECTOMY     COLONOSCOPY     CYSTOSCOPY WITH RETROGRADE PYELOGRAM, URETEROSCOPY AND STENT PLACEMENT Right 03/31/2019   Procedure: CYSTOSCOPY WITH RETROGRADE PYELOGRAM, URETEROSCOPY AND STENT PLACEMENT;  Surgeon: Noel Christmas, MD;  Location: [facility] ;  Service: Urology;  Laterality: Right;   CYSTOSCOPY WITH STENT PLACEMENT Right 03/02/2019   Procedure: CYSTOSCOPY WITH STENT PLACEMENT;  Surgeon: Noel Christmas, MD;  Location: [facility] OR;  Service: Urology;  Laterality: Right;   HOLMIUM LASER APPLICATION Right 03/31/2019   Procedure: HOLMIUM LASER APPLICATION;  Surgeon: Noel Christmas, MD;  Location: [facility];  Service: Urology;  Laterality: Right;   UPPER GASTROINTESTINAL ENDOSCOPY     Patient Active Problem List   Diagnosis Date Noted   Syncope and collapse 12/15/2020   Pure hypercholesterolemia 12/15/2020   Diabetic polyneuropathy (HCC) 03/03/2020   GERD without esophagitis 03/03/2020   Anxiety state    Acute pyelonephritis    Peripheral neuropathy 02/07/2017   Chronic constipation 05/13/2016   Suicide ideation 04/15/2012   LADA (latent autoimmune diabetes in adults), managed as type 1 (HCC)    IBS (irritable  bowel syndrome)    Essential hypertension    Bipolar 1 disorder (HCC)    Abnormality of gait 11/07/2011     THERAPY DIAG:  No diagnosis found.  PCP: Elfredia Nevins, MD    REFERRING PROVIDER: Anson Fret, MD   REFERRING DIAG: 6141660412 (ICD-10-CM) - Cervical myofascial pain syndrome M54.81 (ICD-10-CM) - Occipital neuritis   Rationale for Evaluation and Treatment: Rehabilitation   ONSET DATE: 2 week onset of pain   SUBJECTIVE:  SUBJECTIVE STATEMENT: She says 2 week onset of pain and frequent headaches with insidious onset. She has been feeling dizzy, lightheaded, and has fallen 3 times in past month. She admits to depression and taking lots of different meds. She feels she has short term memory loss. She was checked out by cardiology and neurologist and then was referred to PT. She denies N/T down left arm but has constant N/T in her hands due to diabetic neuropathy.    PERTINENT HISTORY:  past medical history of neuropathy, hyperlipidemia, hypertension, diabetes, depression, bipolar 1, anxiety, abnormality of gait, muscle weakness, lithium toxicity, suicidal ideation.  Polypharmacy was noted.   PAIN:  Are you having pain? Yes: NPRS scale: 5 currently but can get up to 10/10 Pain location: left side of neck   Pain description: pulsing pain, that goes from her head, to her ear and into left upper trap Aggravating factors: turning her head, it gets bad if she cries Relieving factors: ice, heat   PRECAUTIONS: Fall   WEIGHT BEARING RESTRICTIONS: No   FALLS:  Has patient fallen in last 6 months? Yes. Number of falls 3   OCCUPATION: none   PLOF: Independent with household mobility with device   PATIENT GOALS: reduce pain   NEXT MD VISIT:    OBJECTIVE:    DIAGNOSTIC  FINDINGS:  IMPRESSION: This MRI of the brain with and without contrast shows the following: 1.   No acute findings. 2.   Chronic left cerebellar lacunar infarction, also seen on the 2021 MRI. 3.   Scattered T2/FLAIR hyperintense foci in the subcortical and deep white matter consistent with mild chronic microvascular ischemic change.  None of the foci appear to be acute and there does not appear to be any change compared to the 2021 MRI. 4.   Normal enhancement pattern.   PATIENT SURVEYS:  Eval: FOTO 35% functional, goal is 54%   COGNITION: Overall cognitive status: Within functional limits for tasks assessed   SENSATION: WFL   POSTURE: rounded shoulders and forward head   PALPATION: Tender to palpation with trigger points in left cervical P.S, levator, upper trap               CERVICAL ROM:    Active ROM A/PROM (deg) eval  Flexion 30  Extension 20  Right lateral flexion 10  Left lateral flexion 10  Right rotation 47  Left rotation 50   (Blank rows = not tested)   UPPER EXTREMITY ROM:   Active ROM Right eval Left eval  Shoulder flexion 150 150  Shoulder extension      Shoulder abduction      Shoulder adduction      Shoulder extension      Shoulder internal rotation      Shoulder external rotation      Elbow flexion      Elbow extension      Wrist flexion      Wrist extension      Wrist ulnar deviation      Wrist radial deviation      Wrist pronation      Wrist supination       (Blank rows = not tested)   UPPER EXTREMITY MMT:   MMT Right eval Left eval  Shoulder flexion 4 4  Shoulder extension      Shoulder abduction 4 4  Shoulder adduction      Shoulder extension      Shoulder internal rotation 5 5  Shoulder external rotation 4+  4+  Middle trapezius      Lower trapezius      Elbow flexion 5 5  Elbow extension 4 4  Wrist flexion      Wrist extension      Wrist ulnar deviation      Wrist radial deviation      Wrist pronation      Wrist  supination      Grip strength       (Blank rows = not tested)   CERVICAL SPECIAL TESTS:  Spurling's test: Negative   FUNCTIONAL TESTS:      TODAY'S TREATMENT:  Eval HEP creation and review, see below for details IFC TENS to neck and left shoulder with cold pack X 15 min     PATIENT EDUCATION: Education details: HEP, PT plan of care, TENS unit recommendation and print out Person educated: Patient Education method: Programmer, multimedia, Demonstration, Verbal cues, and Handouts Education comprehension: verbalized understanding and needs further education     HOME EXERCISE PROGRAM: Created on HEP 2 go as medbridge was not working CERVICAL RETRACTION / CHIN TUCK   Slowly draw your head back so that your ears line up with your shoulders.   Video # [id number]     Repeat       10 Times Hold  1 Second Complete    1 Set Perform      3 Times a Day           UPPER TRAP STRETCH - HAND BEHIND BACK AND TOP OF HEAD   Begin by retracting your head back into a chin tuck position. Next, place one hand behind your back and gently pull your head towards the opposite side with the help of your other arm.   Video # [id number]    Repeat       5 Times Hold  10 Seconds Complete    1 Set Perform      3 Times a Day           CERVICAL TOWEL ROTATION STRETCH   Hold the ends of a small folded bath towel and wrap it around your head and neck as shown. Place the towel on your face so as to minimize placing pressure on your jaw. Pressure should be placed on the side of your face/cheek bone.   Use your bottom most arm to anchor the towel in place. Use your top most arm to pull the towel to cause a gentle rotational stretch in your neck. Hold, then return to starting position and repeat.     Video # [id number]    Repeat       10 Times Hold  5 Seconds Complete    1 Set Perform      3 Times a Day           Scapular Retractions   Sitting on solid, supportive surface. Extend arms out and place hands on surface  behind and to the sides of hips. Push down into hands as if you were attempting to lift buttocks from surface. Focus on pulling shoulder blades down and in as if you were trying to touch them in the center of your back. Avoid raising shoulders up to ears.   ASSESSMENT:   CLINICAL IMPRESSION: Patient referred to PT for Myofascial cervical muscle pain and left-sided occipital neuritis, and abnormal. MD recommending evaluate and treat including dry needling, stretching, strengthening, manual therapy/massage, heating, TENS unit  as clinically warranted as well as any other modality as recommended  by evaluation. She has had several falls and would benefit from balance/gait training as well. Patient will benefit from skilled PT to address below impairments, limitations and improve overall function.   OBJECTIVE IMPAIRMENTS: decreased activity tolerance, decreased shoulder mobility, decreased ROM, decreased strength, impaired flexibility, impaired UE use, postural dysfunction, and pain.   ACTIVITY LIMITATIONS: reaching, lifting, carry,  cleaning, driving, and or occupation   PERSONAL FACTORS: past medical history of neuropathy, hyperlipidemia, hypertension, diabetes, depression, bipolar 1, anxiety, abnormality of gait, muscle weakness, lithium toxicity, suicidal ideation.  Polypharmacy was noted. also affecting patient's functional outcome.   REHAB POTENTIAL: Good   CLINICAL DECISION MAKING: moderate   EVALUATION COMPLEXITY: moderate       GOALS: Short term PT Goals Target date: 12/11/2021 Pt will be I and compliant with HEP. Baseline:  Goal status: New Pt will decrease pain by 25% overall Baseline: Goal status: New   Long term PT goals Target date: 02/05/2022 Pt will improve neck AROM to [facility] to improve functional reaching Baseline: Goal status: New Pt will improve FOTO to at least 54% functional to show improved function Baseline: Goal status: New Pt will reduce pain to overall less than  3/10 with usual activity and turning her head. Baseline: Goal status: New   PLAN: PT FREQUENCY: 1-2 times per week    PT DURATION: 12 weeks   PLANNED INTERVENTIONS (unless contraindicated): aquatic PT, Canalith repositioning, cryotherapy, Electrical stimulation, Iontophoresis with 4 mg/ml dexamethasome, Moist heat, traction, Ultrasound, gait training, Therapeutic exercise, balance training, neuromuscular re-education, patient/family education, prosthetic training, manual techniques, passive ROM, dry needling, taping, vasopnuematic device, vestibular, spinal manipulations, joint manipulations   PLAN FOR NEXT SESSION: review HEP, how was TENS last time? consider manual therapy, modalities, DN. Needs postural corrective exercises, neck mobility, eventually gait/balance    Champ Mungo, PT 11/17/2021, 8:30 AM

## 2021-11-20 ENCOUNTER — Encounter: Payer: Medicare Other | Admitting: Physical Therapy

## 2021-11-20 ENCOUNTER — Encounter: Payer: Self-pay | Admitting: Physical Therapy

## 2021-11-20 ENCOUNTER — Ambulatory Visit (INDEPENDENT_AMBULATORY_CARE_PROVIDER_SITE_OTHER): Payer: Medicare Other | Admitting: Rehabilitative and Restorative Service Providers"

## 2021-11-20 DIAGNOSIS — R293 Abnormal posture: Secondary | ICD-10-CM | POA: Diagnosis not present

## 2021-11-20 DIAGNOSIS — M6281 Muscle weakness (generalized): Secondary | ICD-10-CM

## 2021-11-20 DIAGNOSIS — M542 Cervicalgia: Secondary | ICD-10-CM | POA: Diagnosis not present

## 2021-11-20 DIAGNOSIS — R2689 Other abnormalities of gait and mobility: Secondary | ICD-10-CM | POA: Diagnosis not present

## 2021-11-20 DIAGNOSIS — F3181 Bipolar II disorder: Secondary | ICD-10-CM | POA: Diagnosis not present

## 2021-11-20 NOTE — Therapy (Signed)
OUTPATIENT PHYSICAL THERAPY TREATMENT   Patient Name: [name] MRN: [id number] DOB:[birth year], 66 y.o., female Today's Date: 11/20/2021  END OF SESSION:    PT End of Session - 11/20/21 1545     Visit Number 2    Number of Visits 15    Date for PT Re-Evaluation 02/05/22    Authorization Type MCR and [employer]    Progress Note Due on Visit 10    PT Start Time 1518    PT Stop Time 1554    PT Time Calculation (min) 36 min    Activity Tolerance Patient tolerated treatment well    Behavior During Therapy WFL for tasks assessed/performed              Past Medical History:  Diagnosis Date   Anxiety    Bipolar 1 disorder (HCC)    Breast density 06/25/2012   Right breast density, will get mammogram and [place]   Depression    Diabetes mellitus without complication (HCC)    type 1   Duodenitis    Fibromyalgia    GERD (gastroesophageal reflux disease)    History of kidney stones    HTN (hypertension)    Hyperlipidemia    IBS (irritable bowel syndrome)    Neuropathy    Sepsis (HCC) 02/27/2019   Sepsis (HCC) 2023   Past Surgical History:  Procedure Laterality Date   ABDOMINAL HYSTERECTOMY     partial   CHOLECYSTECTOMY     COLONOSCOPY     CYSTOSCOPY WITH RETROGRADE PYELOGRAM, URETEROSCOPY AND STENT PLACEMENT Right 03/31/2019   Procedure: CYSTOSCOPY WITH RETROGRADE PYELOGRAM, URETEROSCOPY AND STENT PLACEMENT;  Surgeon: Noel Christmas, MD;  Location: [facility];  Service: Urology;  Laterality: Right;   CYSTOSCOPY WITH STENT PLACEMENT Right 03/02/2019   Procedure: CYSTOSCOPY WITH STENT PLACEMENT;  Surgeon: Noel Christmas, MD;  Location: [facility] OR;  Service: Urology;  Laterality: Right;   HOLMIUM LASER APPLICATION Right 03/31/2019   Procedure: HOLMIUM LASER APPLICATION;  Surgeon: Noel Christmas, MD;  Location: [name];  Service: Urology;  Laterality: Right;   UPPER GASTROINTESTINAL ENDOSCOPY     Patient Active Problem List   Diagnosis  Date Noted   Syncope and collapse 12/15/2020   Pure hypercholesterolemia 12/15/2020   Diabetic polyneuropathy (HCC) 03/03/2020   GERD without esophagitis 03/03/2020   Anxiety state    Acute pyelonephritis    Peripheral neuropathy 02/07/2017   Chronic constipation 05/13/2016   Suicide ideation 04/15/2012   LADA (latent autoimmune diabetes in adults), managed as type 1 (HCC)    IBS (irritable bowel syndrome)    Essential hypertension    Bipolar 1 disorder (HCC)    Abnormality of gait 11/07/2011    PCP: Elfredia Nevins, MD   REFERRING PROVIDER: Anson Fret, MD  REFERRING DIAG: M79.18 (ICD-10-CM) - Cervical 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   Pt indicated feeling some better at times after last visit.   Pt indicated having 3 occurrences of symptoms.   PERTINENT HISTORY:  past medical history of neuropathy, hyperlipidemia, hypertension, diabetes, depression, bipolar 1, anxiety, abnormality of gait, muscle weakness, lithium toxicity, suicidal ideation.  Polypharmacy was noted.  PAIN:  NPRS scale: 5 currently but can get up to 10/10 Pain location: left side of neck   Pain description: pulsing pain, that goes from her head, to her ear and into left upper trap Aggravating factors: turning her head, it gets bad if she cries Relieving factors: ice, heat  PRECAUTIONS: Fall  WEIGHT BEARING RESTRICTIONS: No  FALLS:  Has patient fallen in last 6 months? Yes.  Number of falls 3  OCCUPATION: none  PLOF: Independent with household mobility with device  PATIENT GOALS: reduce pain  NEXT MD VISIT:   OBJECTIVE:   DIAGNOSTIC FINDINGS:  IMPRESSION: This MRI of the brain with and without contrast shows the following: 1.   No acute findings. 2.   Chronic left cerebellar lacunar infarction, also seen on the 2021 MRI. 3.   Scattered T2/FLAIR hyperintense foci in the subcortical and deep white matter consistent with mild chronic microvascular ischemic change.  None of the foci appear to be acute and there does not appear to be any change compared to the 2021 MRI. 4.   Normal enhancement pattern.  PATIENT SURVEYS:  Eval: FOTO 35% functional, goal is 54%  COGNITION: Overall cognitive status: Within functional limits for tasks assessed  SENSATION: WFL  POSTURE: rounded shoulders and forward head  PALPATION: Tender to palpation with trigger points in left cervical P.S, levator, upper trap    CERVICAL ROM:   Active ROM A/PROM (deg) eval  Flexion 30  Extension 20  Right lateral flexion 10  Left lateral flexion 10  Right rotation 47  Left rotation 50   (Blank rows = not tested)  UPPER EXTREMITY ROM:  Active ROM Right eval Left eval  Shoulder flexion 150 150  Shoulder extension    Shoulder abduction    Shoulder adduction    Shoulder extension    Shoulder internal rotation    Shoulder external rotation    Elbow flexion    Elbow extension    Wrist flexion    Wrist extension    Wrist ulnar deviation    Wrist radial deviation    Wrist pronation    Wrist supination     (Blank rows = not tested)  UPPER EXTREMITY MMT:  MMT Right eval Left eval  Shoulder flexion 4 4  Shoulder extension    Shoulder abduction 4 4  Shoulder adduction    Shoulder extension    Shoulder internal rotation 5 5  Shoulder external rotation 4+ 4+  Middle trapezius    Lower trapezius    Elbow flexion 5 5  Elbow extension 4 4  Wrist flexion     Wrist extension    Wrist ulnar deviation    Wrist radial deviation    Wrist pronation    Wrist supination    Grip strength     (Blank rows = not tested)  CERVICAL SPECIAL TESTS:  Spurling's test: Negative  FUNCTIONAL TESTS:    TODAY'S TREATMENT:  11/20/2021: Therex: UBE fwd/rev 3.5 mins each way Review of existing HEP c cues for techniques and procedures.  Additional time spent for review c cues verbally and visually and tactile.  Continued education recommended.  Seated cervical retraction x 10 Seated scapular retraction x 10 Seated towel assisted cervical rotation x 10 each way  Modalities IFC TENS to Lt cervical and upper trap c cold pack 10 mins    Eval HEP creation and review, see below for details IFC TENS to neck and left shoulder with cold pack X 15 min   PATIENT EDUCATION: Education details: HEP printout from [employer] Person educated: Patient Education method: Programmer, multimedia, Facilities manager, Verbal cues, and Handouts Education comprehension: verbalized understanding and needs further education   HOME EXERCISE PROGRAM: Access Code: [email or web address] URL: [email or web address] Date: 11/20/2021 Prepared by: Chyrel Masson  Exercises - Seated Cervical Retraction  - 2-3 x daily - 7 x weekly - 1 sets - 10 reps - Seated Scapular Retraction  - 2-3 x daily - 7 x weekly - 1 sets - 10 reps - 3-5 hold - Seated Assisted Cervical Rotation with Towel  - 2-3 x daily - 7 x weekly - 1 sets - 10 reps  ASSESSMENT:  CLINICAL IMPRESSION:  Continued skilled PT services indicated at this time to progress towards goals.  Cervical pain c headaches with mobility deficits and postural strength deficits evident.   OBJECTIVE IMPAIRMENTS: decreased activity tolerance, decreased shoulder mobility, decreased ROM, decreased strength, impaired flexibility, impaired UE use, postural dysfunction, and pain.  ACTIVITY LIMITATIONS: reaching, lifting, carry,  cleaning, driving, and or  occupation  PERSONAL FACTORS: past medical history of neuropathy, hyperlipidemia, hypertension, diabetes, depression, bipolar 1, anxiety, abnormality of gait, muscle weakness, lithium toxicity, suicidal ideation.  Polypharmacy was noted. also affecting patient's functional outcome.  REHAB POTENTIAL: Good  CLINICAL DECISION MAKING: moderate  EVALUATION COMPLEXITY: moderate    GOALS: Short term PT Goals Target date: 12/11/2021 Pt will be I and compliant with HEP. Baseline:  Goal status: on going - 11/20/2021 Pt will decrease pain by 25% overall Baseline: Goal status: on going - 11/20/2021  Long term PT goals Target date: 02/05/2022 Pt will improve neck AROM to [facility] to improve functional reaching Baseline: Goal status: New Pt will improve FOTO to at least 54% functional to show improved function Baseline: Goal status: New Pt will reduce pain to overall less than 3/10 with usual activity and turning her head. Baseline: Goal status: New  PLAN: PT FREQUENCY: 1-2 times per week   PT DURATION: 12 weeks  PLANNED INTERVENTIONS (unless contraindicated): aquatic PT, Canalith repositioning, cryotherapy, Electrical stimulation, Iontophoresis with 4 mg/ml dexamethasome, Moist heat, traction, Ultrasound, gait training, Therapeutic exercise, balance training, neuromuscular re-education, patient/family education, prosthetic training, manual techniques, passive ROM, dry needling, taping, vasopnuematic device, vestibular, spinal manipulations, joint manipulations  PLAN FOR NEXT SESSION: Continue encouragement for HEP, improved mobility and strength for cervical and periscapular area.  Modalities prn.    Chyrel Masson, PT, DPT, OCS, ATC 11/20/21  3:52 PM

## 2021-11-21 ENCOUNTER — Telehealth: Payer: Self-pay

## 2021-11-21 DIAGNOSIS — F3110 Bipolar disorder, current episode manic without psychotic features, unspecified: Secondary | ICD-10-CM | POA: Diagnosis not present

## 2021-11-21 DIAGNOSIS — I10 Essential (primary) hypertension: Secondary | ICD-10-CM | POA: Diagnosis not present

## 2021-11-21 DIAGNOSIS — R2681 Unsteadiness on feet: Secondary | ICD-10-CM | POA: Diagnosis not present

## 2021-11-21 DIAGNOSIS — F419 Anxiety disorder, unspecified: Secondary | ICD-10-CM | POA: Diagnosis not present

## 2021-11-21 NOTE — Telephone Encounter (Signed)
Pt called and lvm advising her blood sugars have been low. Pt wanted to know what she could do to help maintain her sugars. Called and lvm for pt to call back to provide a little more information regarding blood sugar reading. Wanted to verify if pt is checking with a finger stick or just her CGM. Will follow up.

## 2021-11-22 NOTE — Telephone Encounter (Signed)
LMTCB

## 2021-11-27 ENCOUNTER — Encounter: Payer: Self-pay | Admitting: Adult Health

## 2021-11-27 ENCOUNTER — Ambulatory Visit (INDEPENDENT_AMBULATORY_CARE_PROVIDER_SITE_OTHER): Payer: Medicare Other | Admitting: Adult Health

## 2021-11-27 VITALS — BP 160/92 | HR 92 | Ht 64.0 in | Wt 172.8 lb

## 2021-11-27 DIAGNOSIS — M5481 Occipital neuralgia: Secondary | ICD-10-CM | POA: Diagnosis not present

## 2021-11-27 DIAGNOSIS — H0102A Squamous blepharitis right eye, upper and lower eyelids: Secondary | ICD-10-CM | POA: Diagnosis not present

## 2021-11-27 DIAGNOSIS — H04123 Dry eye syndrome of bilateral lacrimal glands: Secondary | ICD-10-CM | POA: Diagnosis not present

## 2021-11-27 DIAGNOSIS — H1045 Other chronic allergic conjunctivitis: Secondary | ICD-10-CM | POA: Diagnosis not present

## 2021-11-27 DIAGNOSIS — H25813 Combined forms of age-related cataract, bilateral: Secondary | ICD-10-CM | POA: Diagnosis not present

## 2021-11-27 DIAGNOSIS — M7918 Myalgia, other site: Secondary | ICD-10-CM

## 2021-11-27 DIAGNOSIS — H2513 Age-related nuclear cataract, bilateral: Secondary | ICD-10-CM | POA: Diagnosis not present

## 2021-11-27 DIAGNOSIS — H0102B Squamous blepharitis left eye, upper and lower eyelids: Secondary | ICD-10-CM | POA: Diagnosis not present

## 2021-11-27 MED ORDER — LIDOCAINE HCL 2 % IJ SOLN
1.5000 mL | Freq: Once | INTRAMUSCULAR | Status: AC
  Administered 2021-11-27: 30 mg via INTRADERMAL

## 2021-11-27 MED ORDER — BUPIVACAINE HCL (PF) 0.5 % IJ SOLN
1.5000 mL | Freq: Once | INTRAMUSCULAR | Status: AC
  Administered 2021-11-27: 1.5 mL

## 2021-11-27 NOTE — Progress Notes (Signed)
Nerve Block: Lidocaine 2% 47ml total LOT [id number] Exp Feb 2024 [id number]  Bupivacaine 0.5% 38ml total LOT [id number] Exp 01/2024 [id number]

## 2021-11-27 NOTE — Progress Notes (Signed)
   History:   [name] is a 66 year old female with a history of ongoing headache in the occipital region radiating down to the trapezius muscles.  She states that the pain has been ongoing for 30 days.  The patient is now several antidepressant medications.  Including amitriptyline and nortriptyline.  Advised that she should discuss with her PCP if she should be taking these medications as they are in the same drug class.  Patient also states that she takes tramadol daily for pain.  Also will use Tylenol.   11/02/21: (copied from Dr. Trevor Mace note) Today she is here for headaches.  A thorough review of records shows that she has tried the following for headaches: Tylenol, amitriptyline, amlodipine, desipramine, Prozac, gabapentin, Lamictal, losartan, nortriptyline, Zofran, Phenergan, Effexor, Topamax is contraindicated due to patient's cognitive complaints. The headaches started 4 weeks ago, she woke up with a headache on the left side of the face and neck, swollen lyph noes on the left, they saw Dr. Sherwood Gambler and got meds, feelt like stabbing pain radiating down into the neck. It was like she was having tension. CT scan was negative. It was stabbing, no light or sound sensitivity, no nausea, lasted 4 days and went awy, no headache the last 2 days. Pain was severe, 20 minutes at a time or half a day, was also pulsating, no history of migraine or in the family. She has passed on the couch and passes out with her head to the left. She carries all her tension in her neck and shoulder. Robaxin and the fioricet over a few weeks helped.      Bilateral occipital nerve block/trigger point:    Bupivacaine 0.5% and Lidocaine 2 % 1:1 mixture was injected on the scalp bilaterally at several locations:  -On the occipital area of the head, 3 injections each side, 0.5 cc per injection at the midpoint between the mastoid process and the occipital protuberance. 2 other injections were done one finger breadth from the  initial injection, one at a 10 o'clock position and the other at a 2 o'clock position.  The patient currently injections painful, often pulling away as I was trying to inject.  For This reason only injected in the occipital region.  No complications of the procedure were noted. Injections were made with a 27-gauge needle.   She will FU in 1-2 months with Dr. Gerlene Fee, MSN, NP-C 11/27/2021, 9:23 AM [facility] [address] [place], [place] [address] [phone]

## 2021-11-27 NOTE — Patient Instructions (Signed)
Speak to PCP about nortriptyline and amitriptyline? Should you be taking both? Limit daily use of tramadol and OTC medication Like tylenol, aleve, ibuprofen as this can cause rebound headaches.

## 2021-11-28 ENCOUNTER — Telehealth: Payer: Self-pay | Admitting: Adult Health

## 2021-11-28 NOTE — Telephone Encounter (Signed)
Pt's husband, Anaira Seay (on [place]) Pt was seen yesterday, received a block for headache. Injection lasted 6 hours and headache came right back. This morning pain level is a 7. Can she prescribe something to take the edge of the pain off. Mr. Thom asking to respond to message in pt's MyChart.

## 2021-11-28 NOTE — Telephone Encounter (Signed)
We do not prescribe pain medication like opioids but we could try some lyrica or lower dose of gabapentin (she never took it? Or had side effects?). All medications have side effects, if she did not take the gabapanein bc of possible side effects then we must warn her all medications have many side effects.

## 2021-11-28 NOTE — Telephone Encounter (Signed)
Lvm for pt to call back. 

## 2021-11-28 NOTE — Telephone Encounter (Signed)
I called pt.  She said that she had only an hours worth of relief.  (Her husband said 6 hrs in the note).  She said it was a very painful experience and did not want to have done again.  She has not taken gabapentin because of what she has read about it and is not taking this.  PT she has see back in 11/2021 and has upcoming appts.  She asks if any thing could be given to take edge off?

## 2021-11-28 NOTE — Telephone Encounter (Signed)
Does she take gabapentin 800mg  3x a day as states on her med list? Also are they going to PT for this?

## 2021-11-29 ENCOUNTER — Telehealth: Payer: Self-pay | Admitting: Adult Health

## 2021-11-29 NOTE — Telephone Encounter (Signed)
Pt is calling. Stated her head is stabbing with pain for two day. Stated she needs some type of medication to help. Pt is requesting a call-back

## 2021-11-29 NOTE — Telephone Encounter (Signed)
Merged with another existing phone note regarding pt's headache.

## 2021-11-30 ENCOUNTER — Encounter: Payer: Self-pay | Admitting: Neurology

## 2021-11-30 ENCOUNTER — Other Ambulatory Visit: Payer: Self-pay | Admitting: Neurology

## 2021-11-30 DIAGNOSIS — M792 Neuralgia and neuritis, unspecified: Secondary | ICD-10-CM

## 2021-11-30 MED ORDER — PREGABALIN 50 MG PO CAPS: ORAL_CAPSULE | ORAL | 2 refills | Status: DC

## 2021-11-30 NOTE — Telephone Encounter (Signed)
Lyrica prescription sent in

## 2021-11-30 NOTE — Telephone Encounter (Signed)
Merged with pre-existing encounter and sent to Dr Lucia Gaskins.

## 2021-12-05 DIAGNOSIS — H25811 Combined forms of age-related cataract, right eye: Secondary | ICD-10-CM | POA: Diagnosis not present

## 2021-12-06 ENCOUNTER — Encounter: Payer: Medicare Other | Admitting: Physical Therapy

## 2021-12-07 NOTE — Telephone Encounter (Signed)
Pt said still headache pain, Lyrica has not taken affect yet. Having headaches 20 hours of the day, can not sleep. Would like a call from the nurse.

## 2021-12-07 NOTE — Telephone Encounter (Signed)
Called patient and LVM with office number for call back. Also advised I would send a mychart message to her.

## 2021-12-07 NOTE — Telephone Encounter (Signed)
LVM for patient. Spoke with husband (on [place]). He states the patient increased her Lyrica to TID on Tuesday. She is not taking Gabapentin. He states about once per day she has an onset of severe headache (nerve in back of head). It becomes more severe within 10 minutes and shuts her down. She will take a couple of Tylenol, a muscle relaxer, apply an ice pack and lets it ride out. He states she is concerned that "this is the way its going to be". He states she does have OCD and bipolar managed and is fixated on this and needs reassurance that medications take time to work. He states she may have a headache for a large portion of the day but the severe headache is happening once a day. She is going to PT. She is going to start massage and dry needling. The TENS unit is used daily and it helps for about an hour only.

## 2021-12-07 NOTE — Telephone Encounter (Signed)
It takes times and it has only beed 2 days. If no better by nextr week we can increase the dose thanks

## 2021-12-11 ENCOUNTER — Ambulatory Visit (INDEPENDENT_AMBULATORY_CARE_PROVIDER_SITE_OTHER): Payer: Medicare Other | Admitting: Physical Therapy

## 2021-12-11 ENCOUNTER — Encounter: Payer: Self-pay | Admitting: Physical Therapy

## 2021-12-11 DIAGNOSIS — M542 Cervicalgia: Secondary | ICD-10-CM

## 2021-12-11 DIAGNOSIS — R293 Abnormal posture: Secondary | ICD-10-CM

## 2021-12-11 DIAGNOSIS — R2689 Other abnormalities of gait and mobility: Secondary | ICD-10-CM | POA: Diagnosis not present

## 2021-12-11 DIAGNOSIS — M6281 Muscle weakness (generalized): Secondary | ICD-10-CM | POA: Diagnosis not present

## 2021-12-11 NOTE — Therapy (Signed)
OUTPATIENT PHYSICAL THERAPY TREATMENT   Patient Name: BETZAIRA HILES MRN: [id number] DOB:28-Nov-1955, 66 y.o., female Today's Date: 12/11/2021  END OF SESSION:    PT End of Session - 12/11/21 1040     Visit Number 3    Number of Visits 15    Date for PT Re-Evaluation 02/05/22    Authorization Type MCR and tricare    Progress Note Due on Visit 10    PT Start Time [id number]    PT Stop Time 1055    PT Time Calculation (min) 40 min    Activity Tolerance Patient tolerated treatment well    Behavior During Therapy WFL for tasks assessed/performed              Past Medical History:  Diagnosis Date   Anxiety    Bipolar 1 disorder ([place])    Breast density 06/25/2012   Right breast density, will get mammogram and [place]   Depression    Diabetes mellitus without complication (HCC)    type 1   Duodenitis    Fibromyalgia    GERD (gastroesophageal reflux disease)    History of kidney stones    HTN (hypertension)    Hyperlipidemia    IBS (irritable bowel syndrome)    Neuropathy    Sepsis ([place]) 02/27/2019   Sepsis ([place]) 2023   Past Surgical History:  Procedure Laterality Date   ABDOMINAL HYSTERECTOMY     partial   CHOLECYSTECTOMY     COLONOSCOPY     CYSTOSCOPY WITH RETROGRADE PYELOGRAM, URETEROSCOPY AND STENT PLACEMENT Right 03/31/2019   Procedure: CYSTOSCOPY WITH RETROGRADE PYELOGRAM, URETEROSCOPY AND STENT PLACEMENT;  Surgeon: Robley Fries, MD;  Location: [place];  Service: Urology;  Laterality: Right;   CYSTOSCOPY WITH STENT PLACEMENT Right 03/02/2019   Procedure: CYSTOSCOPY WITH STENT PLACEMENT;  Surgeon: Robley Fries, MD;  Location: [place];  Service: Urology;  Laterality: Right;   HOLMIUM LASER APPLICATION Right [id number]   Procedure: HOLMIUM LASER APPLICATION;  Surgeon: Robley Fries, MD;  Location: [facility];  Service: Urology;  Laterality: Right;   UPPER GASTROINTESTINAL ENDOSCOPY     Patient Active Problem List   Diagnosis  Date Noted   Syncope and collapse 12/15/2020   Pure hypercholesterolemia 12/15/2020   Diabetic polyneuropathy ([place]) 03/03/2020   GERD without esophagitis 03/03/2020   Anxiety state    Acute pyelonephritis    Peripheral neuropathy 02/07/2017   Chronic constipation 05/13/2016   Suicide ideation 04/15/2012   LADA (latent autoimmune diabetes in adults), managed as type 1 ([place])    IBS (irritable bowel syndrome)    Essential hypertension    Bipolar 1 disorder (HCC)    Abnormality of gait 11/07/2011    PCP: Redmond School, MD   REFERRING PROVIDER: Melvenia Beam, MD  REFERRING DIAG: M79.18 (ICD-10-CM) - Cervical myofascial pain syndrome 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 She now has a home TENS unit and has been using that to help with pain. She has been limited with her activity levels due to nausea and headaches. She relays difficulty maintaining sugar levels and that she takes about 30 pills per day and nothing helps.   PERTINENT HISTORY:  past medical history of neuropathy, hyperlipidemia, hypertension, diabetes, depression, bipolar 1, anxiety, abnormality of gait, muscle weakness, lithium toxicity, suicidal ideation.  Polypharmacy was noted.  PAIN:  NPRS scale: 5 currently but can get up to 10/10 Pain location: left side of neck   Pain description: pulsing pain, that goes from her head, to her ear and into left  upper trap Aggravating factors: turning her head, it gets bad if she cries Relieving factors: ice, heat  PRECAUTIONS: Fall  WEIGHT BEARING RESTRICTIONS: No  FALLS:  Has patient fallen in last 6 months? Yes. Number of falls 3  OCCUPATION: none  PLOF: Independent with household mobility with device  PATIENT GOALS: reduce pain  NEXT MD VISIT:   OBJECTIVE:   DIAGNOSTIC FINDINGS:  IMPRESSION: This MRI of the brain with and without contrast shows the following: 1.   No acute findings. 2.   Chronic left cerebellar lacunar infarction, also seen on the 2021 MRI. 3.   Scattered T2/FLAIR hyperintense foci in the subcortical and deep white matter consistent with mild chronic microvascular ischemic change.  None of the foci appear to be acute and there does not appear to be any change compared to the 2021 MRI. 4.   Normal enhancement pattern.  PATIENT SURVEYS:  Eval: FOTO 35% functional, goal is 54%  COGNITION: Overall cognitive status: Within functional limits for tasks assessed  SENSATION: WFL  POSTURE: rounded shoulders and forward head  PALPATION: Tender to palpation with trigger points in left cervical P.S, levator, upper trap    CERVICAL ROM:   Active ROM A/PROM (deg) eval  Flexion 30  Extension 20  Right lateral flexion 10  Left lateral flexion 10  Right rotation 47  Left rotation 50   (Blank rows = not tested)  UPPER EXTREMITY ROM:  Active ROM Right eval Left eval  Shoulder flexion 150 150  Shoulder extension    Shoulder abduction    Shoulder adduction    Shoulder extension    Shoulder internal rotation    Shoulder external rotation    Elbow flexion    Elbow extension    Wrist flexion    Wrist extension    Wrist ulnar deviation    Wrist radial deviation    Wrist pronation    Wrist supination     (Blank rows = not tested)  UPPER EXTREMITY MMT:  MMT Right eval Left eval  Shoulder flexion 4 4  Shoulder extension    Shoulder abduction 4 4   Shoulder adduction    Shoulder extension    Shoulder internal rotation 5 5  Shoulder external rotation 4+ 4+  Middle trapezius    Lower trapezius    Elbow flexion 5 5  Elbow extension 4 4  Wrist flexion    Wrist extension    Wrist ulnar deviation    Wrist radial deviation    Wrist pronation    Wrist supination    Grip strength     (Blank rows = not tested)  CERVICAL SPECIAL TESTS:  Spurling's test: Negative  FUNCTIONAL TESTS:  TODAY'S TREATMENT:  12/11/21 Manual therapy for soft tissue mobilization to her cervical paraspinals and upper traps, IASTM with graston tool to these areas as well. Then  skilled palpation and Trigger Point Dry-Needling  Treatment instructions: Expect mild to moderate muscle soreness. Patient Consent Given: Yes Education handout provided: verbally provided Muscles treated: Cervical-thoracic paraspinals and multifidi, upper traps, levator, suboccipitals bilat Treatment response/outcome: good overall tolerance,twitch response noted  Therex: Nu step L5 UE/LE X 9 min HEP review and printed out additional balance exercises to add to HEP  Neuro re-ed Lateral stepping to vectors with cones Modified tandem balance 20 sec X 3 bilat with intermit UE support PRN Aternating foot taps on 6 inch step X 10 bilat with intermit UE support PRN Balance on foam feet together eyes open with intermit UE support PRN Balance feet slightly apart and eyes closed ith intermit UE support PRN    11/20/2021: Therex: UBE fwd/rev 3.5 mins each way Review of existing HEP c cues for techniques and procedures.  Additional time spent for review c cues verbally and visually and tactile.  Continued education recommended.  Seated cervical retraction x 10 Seated scapular retraction x 10 Seated towel assisted cervical rotation x 10 each way  Modalities IFC TENS to Lt cervical and upper trap c cold pack 10 mins    PATIENT EDUCATION: Education details: HEP printout from  [employer] Person educated: Patient Education method: Programmer, multimedia, Facilities manager, Verbal cues, and Handouts Education comprehension: verbalized understanding and needs further education   HOME EXERCISE PROGRAM: Access Code: [email or web address] URL: [email or web address] Date: 12/11/2021 Prepared by: Ivery Quale  Exercises - Semi-Tandem Balance at Counter Top Eyes Closed  - 2 x daily - 6 x weekly - 1 sets - 3 reps - 20 sec hold - Feet Together Balance at [employer] Eyes Closed  - 2 x daily - 6 x weekly - 1 sets - 5 reps - 10 sec hold - Heel Toe Raises with Counter Support  - 2 x daily - 6 x weekly - 2 sets - 10 reps - Side Stepping with Unilateral Counter Support  - 2 x daily - 6 x weekly - 1-2 sets - 10 reps - Seated Cervical Retraction  - 2-3 x daily - 7 x weekly - 1 sets - 10 reps - Seated Scapular Retraction  - 2-3 x daily - 7 x weekly - 1 sets - 10 reps - 3-5 hold - Seated Assisted Cervical Rotation with Towel  - 2-3 x daily - 7 x weekly - 1 sets - 10 reps ASSESSMENT:  CLINICAL IMPRESSION: PT trialed massage and DN to her neck today and worked more to improve her balance and gait. I printed out additional balance exercises to add in at home. I feel her motivation is quite low at this point which may slow her progress so encouraged her to try to be more active at home with walking and HEP.    OBJECTIVE IMPAIRMENTS: decreased activity tolerance, decreased shoulder mobility, decreased ROM, decreased strength, impaired flexibility, impaired UE use, postural dysfunction, and pain.  ACTIVITY LIMITATIONS: reaching, lifting, carry,  cleaning, driving, and or occupation  PERSONAL FACTORS: past medical history of neuropathy, hyperlipidemia, hypertension, diabetes, depression, bipolar 1, anxiety, abnormality of gait, muscle weakness, lithium toxicity, suicidal ideation.  Polypharmacy was noted. also affecting patient's functional outcome.  REHAB POTENTIAL: Good  CLINICAL DECISION  MAKING: moderate  EVALUATION COMPLEXITY: moderate    GOALS: Short term PT Goals Target date: 12/11/2021 Pt will be I and compliant with  HEP. Baseline:  Goal status: on going - 11/20/2021 Pt will decrease pain by 25% overall Baseline: Goal status: on going - 11/20/2021  Long term PT goals Target date: 02/05/2022 Pt will improve neck AROM to [facility] to improve functional reaching Baseline: Goal status: New Pt will improve FOTO to at least 54% functional to show improved function Baseline: Goal status: New Pt will reduce pain to overall less than 3/10 with usual activity and turning her head. Baseline: Goal status: New  PLAN: PT FREQUENCY: 1-2 times per week   PT DURATION: 12 weeks  PLANNED INTERVENTIONS (unless contraindicated): aquatic PT, Canalith repositioning, cryotherapy, Electrical stimulation, Iontophoresis with 4 mg/ml dexamethasome, Moist heat, traction, Ultrasound, gait training, Therapeutic exercise, balance training, neuromuscular re-education, patient/family education, prosthetic training, manual techniques, passive ROM, dry needling, taping, vasopnuematic device, vestibular, spinal manipulations, joint manipulations  PLAN FOR NEXT SESSION: Continue encouragement for HEP, how was DN and STM and repeat if desired. Gait/balance interventions. She now has home TENS so defer TENS treatment in clinic.     Elsie Ra, PT, DPT 12/11/21 10:40 AM

## 2021-12-12 ENCOUNTER — Encounter: Payer: Self-pay | Admitting: Internal Medicine

## 2021-12-15 DIAGNOSIS — F3181 Bipolar II disorder: Secondary | ICD-10-CM | POA: Diagnosis not present

## 2021-12-18 ENCOUNTER — Ambulatory Visit (INDEPENDENT_AMBULATORY_CARE_PROVIDER_SITE_OTHER): Payer: Medicare Other | Admitting: Physical Therapy

## 2021-12-18 DIAGNOSIS — M6281 Muscle weakness (generalized): Secondary | ICD-10-CM | POA: Diagnosis not present

## 2021-12-18 DIAGNOSIS — R2689 Other abnormalities of gait and mobility: Secondary | ICD-10-CM

## 2021-12-18 DIAGNOSIS — R293 Abnormal posture: Secondary | ICD-10-CM | POA: Diagnosis not present

## 2021-12-18 DIAGNOSIS — M542 Cervicalgia: Secondary | ICD-10-CM

## 2021-12-18 NOTE — Therapy (Signed)
OUTPATIENT PHYSICAL THERAPY TREATMENT   Patient Name: Sabrina ComptonBarbara L [name] MRN: [id number] DOB:08/02/1955, 66 y.o., female Today's Date: 12/18/2021  END OF SESSION:    PT End of Session - 12/18/21 1024     Visit Number 4    Number of Visits 15    Date for PT Re-Evaluation 02/05/22    Authorization Type MCR and tricare    Progress Note Due on Visit 10    PT Start Time 1015    PT Stop Time 1053    PT Time Calculation (min) 38 min    Activity Tolerance Patient tolerated treatment well    Behavior During Therapy WFL for tasks assessed/performed              Past Medical History:  Diagnosis Date   Anxiety    Bipolar 1 disorder (HCC)    Breast density 06/25/2012   Right breast density, will get mammogram and [place]   Depression    Diabetes mellitus without complication (HCC)    type 1   Duodenitis    Fibromyalgia    GERD (gastroesophageal reflux disease)    History of kidney stones    HTN (hypertension)    Hyperlipidemia    IBS (irritable bowel syndrome)    Neuropathy    Sepsis (HCC) 02/27/2019   Sepsis (HCC) 2023   Past Surgical History:  Procedure Laterality Date   ABDOMINAL HYSTERECTOMY     partial   CHOLECYSTECTOMY     COLONOSCOPY     CYSTOSCOPY WITH RETROGRADE PYELOGRAM, URETEROSCOPY AND STENT PLACEMENT Right 03/31/2019   Procedure: CYSTOSCOPY WITH RETROGRADE PYELOGRAM, URETEROSCOPY AND STENT PLACEMENT;  Surgeon: Sabrina ChristmasPace, Maryellen D, MD;  [facility] [place];  Service: Urology;  Laterality: Right;   CYSTOSCOPY WITH STENT PLACEMENT Right 03/02/2019   Procedure: CYSTOSCOPY WITH STENT PLACEMENT;  Surgeon: Sabrina ChristmasPace, Maryellen D, MD;  [facility];  Service: Urology;  Laterality: Right;   HOLMIUM LASER APPLICATION Right 03/31/2019   Procedure: HOLMIUM LASER APPLICATION;  Surgeon: Sabrina ChristmasPace, Maryellen D, MD;  [facility] [place];  Service: Urology;  Laterality: Right;   UPPER GASTROINTESTINAL ENDOSCOPY     Patient Active Problem List   Diagnosis  Date Noted   Syncope and collapse 12/15/2020   Pure hypercholesterolemia 12/15/2020   Diabetic polyneuropathy (HCC) 03/03/2020   GERD without esophagitis 03/03/2020   Anxiety state    Acute pyelonephritis    Peripheral neuropathy 02/07/2017   Chronic constipation 05/13/2016   Suicide ideation 04/15/2012   LADA (latent autoimmune diabetes in adults), managed as type 1 (HCC)    IBS (irritable bowel syndrome)    Essential hypertension    Bipolar 1 disorder (HCC)    Abnormality of gait 11/07/2011    PCP: [name], [name], MD   REFERRING PROVIDER: Anson FretAhern, Antonia B, MD  REFERRING DIAG: M79.18 (ICD-10-CM) - Cervical myofascial pain syndrome 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 Her pain is overall not as bad today, she did not tell much difference after DN therapy.   PERTINENT HISTORY:  past medical history of neuropathy, hyperlipidemia, hypertension, diabetes, depression, bipolar 1, anxiety, abnormality of gait, muscle weakness, lithium toxicity, suicidal ideation.  Polypharmacy was noted.  PAIN:  NPRS scale: 4 currently Pain location: left side of neck   Pain description: pulsing pain, that goes from her head, to her ear and into left upper trap Aggravating factors: turning her head, it gets bad if she cries Relieving factors: ice, heat  PRECAUTIONS: Fall  WEIGHT BEARING RESTRICTIONS: No  FALLS:  Has patient fallen in last 6 months? Yes. Number of falls 3  OCCUPATION:  none  PLOF: Independent with household mobility with device  PATIENT GOALS: reduce pain  NEXT MD VISIT:   OBJECTIVE:   DIAGNOSTIC FINDINGS:  IMPRESSION: This MRI of the brain with and without contrast shows the following: 1.   No acute findings. 2.   Chronic left cerebellar lacunar infarction, also seen on the 2021 MRI. 3.   Scattered T2/FLAIR hyperintense foci in the subcortical and deep white matter consistent with mild chronic microvascular ischemic change.  None of the foci appear to be acute and there does not appear to be any change compared to the 2021 MRI. 4.   Normal enhancement pattern.  PATIENT SURVEYS:  Eval: FOTO 35% functional, goal is 54%  COGNITION: Overall cognitive status: Within functional limits for tasks assessed  SENSATION: WFL  POSTURE: rounded shoulders and forward head  PALPATION: Tender to palpation with trigger points in left cervical P.S, levator, upper trap    CERVICAL ROM:   Active ROM AROM (deg) eval AROM 12/18/21  Flexion 30   Extension 20   Right lateral flexion 10 20  Left lateral flexion 10 15  Right rotation 47 60  Left rotation 50 55   (Blank rows = not tested)  UPPER EXTREMITY ROM:  Active ROM Right eval Left eval  Shoulder flexion 150 150  Shoulder extension    Shoulder abduction    Shoulder adduction    Shoulder extension    Shoulder internal rotation    Shoulder external rotation    Elbow flexion    Elbow extension    Wrist flexion    Wrist extension    Wrist ulnar deviation    Wrist radial deviation    Wrist pronation    Wrist supination     (Blank rows = not tested)  UPPER EXTREMITY MMT:  MMT Right eval Left eval  Shoulder flexion 4 4  Shoulder extension    Shoulder abduction 4 4  Shoulder adduction    Shoulder extension    Shoulder internal rotation 5 5  Shoulder external rotation 4+ 4+  Middle trapezius    Lower trapezius    Elbow flexion 5 5  Elbow extension 4 4  Wrist flexion    Wrist  extension    Wrist ulnar deviation    Wrist radial deviation    Wrist pronation    Wrist supination    Grip strength     (Blank rows = not tested)  CERVICAL SPECIAL TESTS:  Spurling's test: Negative  FUNCTIONAL TESTS:    TODAY'S TREATMENT:  12/18/21 Manual therapy for soft tissue mobilization to her cervical paraspinals and upper traps, IASTM with cupping to these areas as well.  Therex: Nu step L5 UE/LE X 9 min Upper trap stretch 10 sec X  5 bilat Levator stretch 10 sec X 3 bilat Cervical rotation stretch 5 sec X 5 bilat with self OP  Neuro re-ed Lateral stepping at counter top 3 round trips no UE support Modified tandem balance 20 sec X 3 bilat with intermit UE support PRN Retro walking at counter top 3 round trips no UE support Walking with head turns at counter top 3 round trips no UE support Walking with head nods at counter top 3 round trips no UE support Walking eyes closed at counter top 3 round trips no UE support Balance on foam feet together eyes open 1.5 min  12/11/21 Manual therapy for soft tissue mobilization to her cervical paraspinals and upper traps, IASTM with graston tool to these areas as well. Then  skilled palpation and Trigger Point Dry-Needling  Treatment instructions: Expect mild to moderate muscle soreness. Patient Consent Given: Yes Education handout provided: verbally provided Muscles treated: Cervical-thoracic paraspinals and multifidi, upper traps, levator, suboccipitals bilat Treatment response/outcome: good overall tolerance,twitch response noted  Therex: Nu step L5 UE/LE X 9 min HEP review and printed out additional balance exercises to add to HEP  Neuro re-ed Lateral stepping to vectors with cones Modified tandem balance 20 sec X 3 bilat with intermit UE support PRN Aternating foot taps on 6 inch step X 10 bilat with intermit UE support PRN Balance on foam feet together eyes open with intermit UE support PRN Balance feet slightly apart  and eyes closed ith intermit UE support PRN     PATIENT EDUCATION: Education details: HEP printout from [employer] Person educated: Patient Education method: Programmer, multimedia, Facilities manager, Verbal cues, and Handouts Education comprehension: verbalized understanding and needs further education   HOME EXERCISE PROGRAM: Access Code: [email or web address] URL: [email or web address] Date: 12/11/2021 Prepared by: Ivery Quale  Exercises - Semi-Tandem Balance at Counter Top Eyes Closed  - 2 x daily - 6 x weekly - 1 sets - 3 reps - 20 sec hold - Feet Together Balance at [employer] Eyes Closed  - 2 x daily - 6 x weekly - 1 sets - 5 reps - 10 sec hold - Heel Toe Raises with Counter Support  - 2 x daily - 6 x weekly - 2 sets - 10 reps - Side Stepping with Unilateral Counter Support  - 2 x daily - 6 x weekly - 1-2 sets - 10 reps - Seated Cervical Retraction  - 2-3 x daily - 7 x weekly - 1 sets - 10 reps - Seated Scapular Retraction  - 2-3 x daily - 7 x weekly - 1 sets - 10 reps - 3-5 hold - Seated Assisted Cervical Rotation with Towel  - 2-3 x daily - 7 x weekly - 1 sets - 10 reps ASSESSMENT:  CLINICAL IMPRESSION: Deferred DN today as she could not tell much difference from this. Did continue with manual therapy and try cupping therapy for trigger point release. After this I updated her neck ROM measurements which do show some progress with PT. She will continue to benefit from PT for neck mobility and balance.    OBJECTIVE IMPAIRMENTS: decreased activity tolerance, decreased shoulder mobility, decreased ROM, decreased strength, impaired flexibility, impaired UE use, postural dysfunction, and pain.  ACTIVITY LIMITATIONS: reaching, lifting, carry,  cleaning, driving, and or occupation  PERSONAL FACTORS: past medical history of neuropathy, hyperlipidemia, hypertension, diabetes, depression, bipolar 1, anxiety, abnormality of gait, muscle weakness, lithium toxicity, suicidal ideation.  Polypharmacy  was noted. also affecting patient's functional outcome.  REHAB POTENTIAL: Good  CLINICAL DECISION  MAKING: moderate  EVALUATION COMPLEXITY: moderate    GOALS: Short term PT Goals Target date: 12/11/2021 Pt will be I and compliant with HEP. Baseline:  Goal status: on going - 11/20/2021 Pt will decrease pain by 25% overall Baseline: Goal status: on going - 11/20/2021  Long term PT goals Target date: 02/05/2022 Pt will improve neck AROM to [facility] to improve functional reaching Baseline: Goal status: New Pt will improve FOTO to at least 54% functional to show improved function Baseline: Goal status: New Pt will reduce pain to overall less than 3/10 with usual activity and turning her head. Baseline: Goal status: New  PLAN: PT FREQUENCY: 1-2 times per week   PT DURATION: 12 weeks  PLANNED INTERVENTIONS (unless contraindicated): aquatic PT, Canalith repositioning, cryotherapy, Electrical stimulation, Iontophoresis with 4 mg/ml dexamethasome, Moist heat, traction, Ultrasound, gait training, Therapeutic exercise, balance training, neuromuscular re-education, patient/family education, prosthetic training, manual techniques, passive ROM, dry needling, taping, vasopnuematic device, vestibular, spinal manipulations, joint manipulations  PLAN FOR NEXT SESSION: Continue encouragement for HEP, how was cupping STM and repeat if desired. Gait/balance interventions. She now has home TENS so defer TENS treatment in clinic.     Ivery Quale, PT, DPT 12/18/21 11:00 AM

## 2021-12-19 DIAGNOSIS — H25812 Combined forms of age-related cataract, left eye: Secondary | ICD-10-CM | POA: Diagnosis not present

## 2021-12-27 DIAGNOSIS — N393 Stress incontinence (female) (male): Secondary | ICD-10-CM | POA: Diagnosis not present

## 2021-12-27 DIAGNOSIS — N200 Calculus of kidney: Secondary | ICD-10-CM | POA: Diagnosis not present

## 2021-12-27 DIAGNOSIS — N3020 Other chronic cystitis without hematuria: Secondary | ICD-10-CM | POA: Diagnosis not present

## 2021-12-28 ENCOUNTER — Encounter: Payer: Medicare Other | Admitting: Physical Therapy

## 2021-12-29 ENCOUNTER — Encounter: Payer: Self-pay | Admitting: Internal Medicine

## 2021-12-29 ENCOUNTER — Ambulatory Visit (INDEPENDENT_AMBULATORY_CARE_PROVIDER_SITE_OTHER): Payer: Medicare Other | Admitting: Internal Medicine

## 2021-12-29 VITALS — BP 132/78 | HR 94 | Ht 64.0 in | Wt 171.2 lb

## 2021-12-29 DIAGNOSIS — G63 Polyneuropathy in diseases classified elsewhere: Secondary | ICD-10-CM

## 2021-12-29 DIAGNOSIS — E139 Other specified diabetes mellitus without complications: Secondary | ICD-10-CM | POA: Diagnosis not present

## 2021-12-29 DIAGNOSIS — E785 Hyperlipidemia, unspecified: Secondary | ICD-10-CM

## 2021-12-29 NOTE — Patient Instructions (Addendum)
Please continue: - Metformin 500 mg 2x a day with meals  Increase: - Lantus 20 units in am and 25 units at night - NovoLog:  13-15 units before the 3 meals - Novolog Sliding scale: 201-225: + 2 units 225-250: + 3 units 251-275: + 4 units >275: + 5 units If your sugars are <100 and you eat a low carb meal, inject only ~6 units. So, If sugars are <100 before the meals, you still need to take insulin for that meal. Do not correct sugars <300 at bedtime. If sugars are <70 before a meal, bring the sugar up first, then inject insulin for the meals.  Allow the sugars to drop to 80 before you start correcting them.  Please return in 4 months.

## 2021-12-29 NOTE — Progress Notes (Signed)
Patient ID: [name], female   DOB: [birth year], 66 y.o.   MRN: [id number]  HPI: [name] is a 66 y.o.-year-old female, initially referred by her PCP, Dr. Sharilyn Sites (PA: Collene Mares), returning for f/u for LADA, dx 2005, insulin-dependent since ~2006, controlled, with complications (PN, gastroparesis?).  Last visit 4 months ago.  She is here with her husband for the first part of the history, including insulin doses, diet, symptoms.  Interim history: She has bipolar disease  and also has short-term memory loss and disequilibrium.  She sees neurology. She also has anxiety and sees a Social worker.   She continues to have nausea and increased urination, but no blurry vision, chest pain.  She has dry eyes. In 11/2021, she contacted me with low blood sugars.  Of note, she was in physical therapy at that time. She started to have tension HAs >> saw neurology. She had cataract surgeries since last OV.  Reviewed history: She was on an Omnipod insulin pump on 08/01/2015. She had severe anxiety and depression and got so overwhelmed with the insulin pump that we had to take her off the pump.  We switched her to a basal-bolus insulin regimen, but she was unable to calculate the insulin doses based on insulin to carb ratio, so now she is using fixed rapid acting insulin doses.  Reviewed HbA1c levels: 08/25/2021: HbA1c calculated from fructosamine is 8.6%, higher than before. 04/25/2021: HbA1c calculated from fructosamine is approximately 7.3%, higher than before.  03/2021: Reportedly HbA1c 4.1% in Dr. Nolon Rod office. Lab Results  Component Value Date   HGBA1C 5.0 03/03/2020   HGBA1C 5.8 (A) 11/09/2019   HGBA1C 5.7 (A) 06/30/2019  01/24/2021: HbA1c calculated from fructosamine is 6.9%. 10/17/2020: HbA1c calculated from fructosamine is 7.25%. 07/11/2020: HbA1c calculated from fructosamine is 7.3%, slightly higher than before. 04/21/2020: HbA1c calculated from fructosamine has improved to  7.1%. 03/04/2018:  Hba1c calculated from fructosamine is the best in a long time, at 6.84%! 12/19/2017: HbA1c calculated from fructosamine has improved to 7.1%! 09/12/2017: HbA1c  Calculated from the fructosamine is 7.5%, improved from before. 05/07/2017: HbA1c calculated from the fructosamine was 7.7% 02/07/2017: HbA1c calculated from the fructosamine was higher, at 7.7%. 11/07/2016: HbA1c calc. from the fructosamine is a little lower, at 7.25% 08/07/2016: HbA1c calculated from the fructosamine is 7.5% 05/31/2016: HbA1c calculated from fructosamine is better, at 7.25% 12/15/2015: HbA1c calculated from the fructosamine is higher, 8.4%. 08/24/2015:  HbA1c calculated from fructosamine is 7.4%. 05/24/2015: HbA1c calculated from fructosamine is 6.9%.  She is on: - Metformin 1000 mg with dinner >> 500 mg 2x a day - Lantus 15 units in am and 25 >> 20 units at night - NovoLog:  10-12 units in am 10-12 units with lunch 10-12 units with dinner - Novolog Sliding scale: 201-250: + 2 units 251-300: + 3 units 301-350: + 4 units >350: + 5 units If your sugars are <100 and you eat a low carb meal, inject only ~6 units. So, If sugars are <100 before the meals, you still need to take insulin for that meal. Do not correct sugars <300 at bedtime. If sugars are <70 before a meal, bring the sugar up first, then inject insulin for the meals.  Pt checks her sugars >4x a day with her Dexcom CGM:  Previously:   Lowest sugar was 32 >>... 40 >> 53 >> 53 >> 100; she has hypoglycemia awareness in the 50s Highest sugar was 351 >> 383 >> 358 >> 300s.  No  CKD, last BUN/creatinine:  Lab Results  Component Value Date   BUN 19 04/25/2021   CREATININE 0.60 04/25/2021   No microalbuminuria: Lab Results  Component Value Date   MICRALBCREAT 5.4 11/07/2016    + Dyslipidemia: Lab Results  Component Value Date   CHOL 178 10/17/2020   HDL 45.70 10/17/2020   LDLCALC 100 (H) 10/17/2020   TRIG 165.0 (H)  10/17/2020   CHOLHDL 4 10/17/2020  She was started on Crestor 10 mg daily - but taken off, on Ezetimibe..  -Last eye exam was on 02/2021: No DR reportedly, but dry eyes >> Dr Katy Fitch.  -+ Numbness and tingling in her feet.  Also, occasional pain.  She was on Neurontin, but now only on nortriptyline.  These are refilled by PCP.  She is also on alpha-lipoic acid. She sees a podiatrist [facility]).  She had 2 toenails removed in the past due to fungal infection.  Last foot exam 08/25/2021.  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  She has a diagnosis of Parkinson's disease.  She also has bipolar disease. Also: GERD, HTN, fibromyalgia.  Latest TSH was normal: Lab Results  Component Value Date   TSH 0.533 03/03/2020   ROS: + See HPI Neurological: no tremors/+ numbness/+ tingling/no dizziness, + disequilibrium  I reviewed pt's medications, allergies, PMH, social hx, family hx, and changes were documented in the history of present illness. Otherwise, unchanged from my initial visit note.  Past Medical History:  Diagnosis Date   Anxiety    Bipolar 1 disorder ([place])    Breast density 06/25/2012   Right breast density, will get mammogram and [place]   Depression    Diabetes mellitus without complication (HCC)    type 1   Duodenitis    Fibromyalgia    GERD (gastroesophageal reflux disease)    History of kidney stones    HTN (hypertension)    Hyperlipidemia    IBS (irritable bowel syndrome)    Neuropathy    Sepsis ([place]) 02/27/2019   Sepsis ([place]) 2023   Past Surgical History:  Procedure Laterality Date   ABDOMINAL HYSTERECTOMY     partial   CHOLECYSTECTOMY     COLONOSCOPY     CYSTOSCOPY WITH RETROGRADE PYELOGRAM, URETEROSCOPY AND STENT PLACEMENT Right 03/31/2019   Procedure: CYSTOSCOPY WITH RETROGRADE PYELOGRAM, URETEROSCOPY AND STENT PLACEMENT;  Surgeon: Robley Fries, MD;  Location: [place];  Service: Urology;  Laterality: Right;   CYSTOSCOPY  WITH STENT PLACEMENT Right 03/02/2019   Procedure: CYSTOSCOPY WITH STENT PLACEMENT;  Surgeon: Robley Fries, MD;  Location: [place];  Service: Urology;  Laterality: Right;   HOLMIUM LASER APPLICATION Right [id number]   Procedure: HOLMIUM LASER APPLICATION;  Surgeon: Robley Fries, MD;  Location[facility]  Service: Urology;  Laterality: Right;   UPPER GASTROINTESTINAL ENDOSCOPY     Social History   Socioeconomic History   Marital status: Married    Spouse name: phillip   Number of children: 1   Years of education: 12   Highest education level: High school graduate  Occupational History   Occupation: Disabled  Tobacco Use   Smoking status: Never   Smokeless tobacco: Never  Vaping Use   Vaping Use: Some days   Substances: Flavoring  Substance and Sexual Activity   Alcohol use: No    Alcohol/week: 0.0 standard drinks of alcohol   Drug use: No   Sexual activity: Not Currently  Other Topics Concern   Not  on file  Social History Narrative   Lives at home w/ her husband   Left-handed   Caffeine: 1-2 cups of coffee daily   Social Determinants of Health   Financial Resource Strain: Low Risk  (12/07/2016)   Overall Financial Resource Strain (CARDIA)    Difficulty of Paying Living Expenses: Not hard at all  Food Insecurity: No Food Insecurity (12/07/2016)   Hunger Vital Sign    Worried About Running Out of Food in the Last Year: Never true    Ran Out of Food in the Last Year: Never true  Transportation Needs: Unmet Transportation Needs (12/07/2016)   PRAPARE - Hydrologist (Medical): Yes    Lack of Transportation (Non-Medical): Yes  Physical Activity: Inactive (12/07/2016)   Exercise Vital Sign    Days of Exercise per Week: 0 days    Minutes of Exercise per Session: 0 min  Stress: Not on file  Social Connections: Somewhat Isolated (12/07/2016)   Social Connection and Isolation Panel [NHANES]    Frequency of Communication with  Friends and Family: Never    Frequency of Social Gatherings with Friends and Family: Never    Attends Religious Services: More than 4 times per year    Active Member of [employer] or Organizations: No    Attends Archivist Meetings: Never    Marital Status: Married  Human resources officer Violence: Not At Risk (12/07/2016)   Humiliation, Afraid, Rape, and Kick questionnaire    Fear of Current or Ex-Partner: No    Emotionally Abused: No    Physically Abused: No    Sexually Abused: No   Current Outpatient Medications on File Prior to Visit  Medication Sig Dispense Refill   pregabalin (LYRICA) 50 MG capsule Start with twice daily. If no side effects in 1 week may increase to 3x daily. Do not take with gabapentin, stop gabapentin, 90 capsule 2   acetaminophen (TYLENOL) 500 MG tablet Take 1,000 mg by mouth every 6 (six) hours as needed for mild pain or headache.     ALPRAZolam (XANAX) 1 MG tablet Take 1 mg by mouth 4 (four) times daily as needed for anxiety.      AMBULATORY NON FORMULARY MEDICATION Squatty Potty x 1 1 each 0   amitriptyline (ELAVIL) 25 MG tablet Take 25 mg by mouth at bedtime.     amLODipine (NORVASC) 5 MG tablet Take 5 mg by mouth daily.     aspirin EC 81 MG tablet Take 1 tablet (81 mg total) by mouth daily. Swallow whole. 30 tablet 11   BD PEN NEEDLE NANO U/F 32G X 4 MM MISC USE WITH INSULIN PEN FOUR TIMES A DAY 400 each 3   BENICAR 40 MG tablet Take 40 mg by mouth daily.     busPIRone (BUSPAR) 10 MG tablet Take 10 mg by mouth in the morning and at bedtime.     Butalbital-APAP-Caffeine 50-325-40 MG capsule Take 1 capsule by mouth 4 (four) times daily as needed.     cholecalciferol (VITAMIN D3) 25 MCG (1000 UNIT) tablet Take 1,000 Units by mouth daily.     Continuous Blood Gluc Receiver (DEXCOM G6 RECEIVER) DEVI Use as instructed to check blood sugar. 1 each 0   Continuous Blood Gluc Sensor (DEXCOM G6 SENSOR) MISC Use as instructed to check blood sugar change every 10 days 9  each 3   Continuous Blood Gluc Transmit (DEXCOM G6 TRANSMITTER) MISC Use as instructed to check blood sugar. Change every 90  days 1 each 3   Docusate Sodium (COLACE PO) Take 100 mg by mouth daily.     esomeprazole (NEXIUM) 40 MG capsule Take 40 mg by mouth 2 (two) times daily.     ezetimibe (ZETIA) 10 MG tablet Take 10 mg by mouth daily.     Glucagon (BAQSIMI ONE PACK) 3 MG/DOSE POWD Use 1 spray in the nose as needed for hypoglycemia 1 each PRN   glucose blood (FREESTYLE LITE) test strip USE TO CHECK BLOOD SUGAR 6 TIMES PER DAY 600 each 3   insulin aspart (NOVOLOG FLEXPEN) 100 UNIT/ML FlexPen INJECT 8 TO 10 UNITS UNDER THE SKIN AT THE START OF THE THREE MAIN MEALS AS DIRECTED 30 mL 3   insulin glargine (LANTUS SOLOSTAR) 100 UNIT/ML Solostar Pen INJECT 45 UNITS UNDER THE SKIN DAILY AS ADVISED 30 mL 4   Insulin Syringes, Disposable, U-100 1 ML MISC To use for insulin injection.. 100 each 1   Lancets (FREESTYLE) lancets USE TO TEST BLOOD SUGAR 4 TO 6 TIMES DAILY AS INSTRUCTED 300 each 6   losartan (COZAAR) 25 MG tablet Take 25 mg by mouth daily.     metFORMIN (GLUCOPHAGE) 500 MG tablet TAKE 1 TABLET TWICE A DAY WITH MEALS 180 tablet 3   methocarbamol (ROBAXIN) 500 MG tablet Take 500 mg by mouth 4 (four) times daily as needed.     nortriptyline (PAMELOR) 50 MG capsule Take 50 mg by mouth at bedtime.      ondansetron (ZOFRAN-ODT) 4 MG disintegrating tablet PLACE 1 TO 2 TABLETS ON TONGUE EVERY 4 TO 6 HOURS AS NEEDED FOR NAUSEA (Patient taking differently: Take 4-8 mg by mouth every 4 (four) hours as needed for nausea or vomiting.) 180 tablet 4   OSCIMIN 0.125 MG SUBL DISSOLVE 1 TABLET UNDER THE TONGUE EVERY 4 HOURS AS NEEDED (NEED APPOINTMENT) 360 tablet 0   Probiotic CAPS Take 1 capsule by mouth daily. 30 capsule 1   Propylene Glycol (SYSTANE BALANCE OP) Place 1 drop into both eyes daily as needed (dry eyes).     traZODone (DESYREL) 100 MG tablet 1  qhs  May increse to 2  qhs  After 1 week (Patient  taking differently: Take 100 mg by mouth at bedtime. 1  qhs  May increse to 2  qhs  After 1 week) 180 tablet 1   venlafaxine (EFFEXOR) 37.5 MG tablet Take 37.5 mg by mouth daily.     vitamin B-12 (CYANOCOBALAMIN) 1000 MCG tablet Take 1,000 mcg by mouth daily.     No current facility-administered medications on file prior to visit.   Allergies  Allergen Reactions   Abilify [Aripiprazole]     Patient is intolerant   Phenergan [Promethazine] Diarrhea and Nausea And Vomiting   Reglan [Metoclopramide] Other (See Comments)    Chest pains   Family History  Problem Relation Age of Onset   Hypertension Mother    Bipolar disorder Mother    Cancer Mother    Depression Father    Cancer Father    Thyroid disease Sister    Hypertension Sister    Bipolar disorder Sister    Heart disease Brother    Bipolar disorder Maternal Aunt    Bipolar disorder Daughter    Colon cancer Neg Hx    Colon polyps Neg Hx    Esophageal cancer Neg Hx    Kidney disease Neg Hx    Gallbladder disease Neg Hx    Diabetes Neg Hx    Dementia Neg Hx  Tremor Neg Hx    Seizures Neg Hx    Migraines Neg Hx    Headache Neg Hx     PE: There were no vitals taken for this visit.   Wt Readings from Last 3 Encounters:  11/27/21 172 lb 12.8 oz (78.4 kg)  11/02/21 174 lb 6.4 oz (79.1 kg)  08/25/21 173 lb 12.8 oz (78.8 kg)   Constitutional: slightly overweight, in NAD Eyes: EOMI, no exophthalmos ENT: no thyromegaly, no cervical lymphadenopathy Cardiovascular: Tachycardia, RR, No MRG Respiratory: CTA B Musculoskeletal: no deformities Skin: + many tatoos Neurological: no tremor with outstretched hands  ASSESSMENT: 1. 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  Component     Latest Ref Rng 07/16/2013  C-Peptide     0.80 - 3.90 ng/mL 1.29  Glucose     70 - 99 mg/dL 183 (H)  Glutamic Acid Decarb Ab     <=1.0 U/mL  11.3 (H)  Pancreatic Islet Cell Antibody     <5 JDF Units 5 (A)  + anti-pancreatic antibodies >> LADA rather than type 2 DM. She still has a positive C peptide >> still has insulin secretion.  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 HL  PLAN:  1.  Patient with poorly controlled LADA, difficult to manage due to depression/anxiety/GI problems.  Sugars usually fluctuate between 30s and 300s.  In the past, she was on an insulin pump but she could not continue it due to anxiety.  She was started back on a CGM before last visit. -At last visit, reviewing the CGM trends, sugars were mostly above the upper limit of the target range, mostly in the 200s.  She was eating frequently to avoid low blood sugars.  We decreased the Lantus dose in the morning and increase the dose of NovoLog throughout the day.  We discussed about not eating between meals ideally or, if she had a snack, to bolus insulin for it.  We also discussed about not correcting blood sugars at bedtime unless the sugars were higher than 300 to avoid lows overnight. -She contacted me in 11/2021 with lower blood sugars.  Of note, she was in physical therapy at that time. CGM interpretation: -At today's visit, we reviewed her CGM downloads: It appears that 16% of values are in target range (goal >70%), while 84% are higher than 180 (goal <25%), and 0% are lower than 70 (goal <4%).  The calculated average blood sugar is 248.  The projected HbA1c for the next 3 months (GMI) is 9.3%. -Reviewing the CGM trends, sugars are very high throughout the day and night, barely touching the normal range.  They are even higher after breakfast and lunch.  Her husband is telling me that patient does not allow the blood sugars to drop, she corrects them as soon as they can start to improve.  For example, he describes that she does not like the blood sugars be lower than 250 when she goes to bed for fear of hypoglycemia.  We discussed that in this  case, nothing will help control her diabetes.  She absolutely needs to let the sugars drop down to the 80s and follow the Dexcom arrows.  I advised her that if the areas are trending down afterwards, she needs to start correcting the lows, but otherwise, to allow the sugars to improve.  I did advise her to increase Lantus in the morning and also her  NovoLog.  I also changed her sliding scale to give her more correction insulin. -We again reviewed what constitutes a small and large meal.  She keeps telling me that she does not eat much but and I advised her that it is not the actual amount/portion that counts, but the hyperglycemic peak blood sugars afterwards which determines how much insulin she will need to have before start the meal. -I initially recommended a VGo patch pump.  She is not very enthusiastic about this but mentions that she will try if needed.  We discussed that if the sugars do not improve after the above changes, have her talk with the diabetes educator about this pump. - I suggested to:  Patient Instructions  Please continue: - Metformin 500 mg 2x a day with meals  Increase: - Lantus 20 units in am and 25 units at night - NovoLog:  13-15 units before the 3 meals - Novolog Sliding scale: 201-225: + 2 units 225-250: + 3 units 251-275: + 4 units >275: + 5 units If your sugars are <100 and you eat a low carb meal, inject only ~6 units. So, If sugars are <100 before the meals, you still need to take insulin for that meal. Do not correct sugars <300 at bedtime. If sugars are <70 before a meal, bring the sugar up first, then inject insulin for the meals.  Allow the sugars to drop to 80 before you start correcting them.  Please return in 4 months.  - advised to check sugars at different times of the day - 4x a day, rotating check times - advised for yearly eye exams >> she is UTD - return to clinic in 4 months  2. PN  -Related to diabetes. -She can use on nortriptyline and  Lyrica.  She was on gabapentin in the past but this was tapered off. - she is also on alpha lipoic acid 600 mg twice a day; also on Nervine Roll On -In summer 2021, I referred her to physical therapy at [facility] office, upon her request, since she had this before with good results.  She had physical therapy after last visit.  3. HL -Reviewed latest lipid panel from 10/2020: LDL above target, triglycerides slightly high: Lab Results  Component Value Date   CHOL 178 10/17/2020   HDL 45.70 10/17/2020   LDLCALC 100 (H) 10/17/2020   TRIG 165.0 (H) 10/17/2020   CHOLHDL 4 10/17/2020  -She was started on Crestor 10 mg daily  - now off (she does not remember why), and currently on Zetia 10 mg daily.  She tolerates it well. - she had another lipid panel 2 mo ago >> will need records  Philemon Kingdom, MD PhD [facility]

## 2022-01-01 ENCOUNTER — Other Ambulatory Visit: Payer: Self-pay | Admitting: Internal Medicine

## 2022-01-02 ENCOUNTER — Encounter: Payer: Self-pay | Admitting: Rehabilitative and Restorative Service Providers"

## 2022-01-02 ENCOUNTER — Ambulatory Visit (INDEPENDENT_AMBULATORY_CARE_PROVIDER_SITE_OTHER): Payer: Medicare Other | Admitting: Rehabilitative and Restorative Service Providers"

## 2022-01-02 DIAGNOSIS — M542 Cervicalgia: Secondary | ICD-10-CM

## 2022-01-02 DIAGNOSIS — R2689 Other abnormalities of gait and mobility: Secondary | ICD-10-CM

## 2022-01-02 DIAGNOSIS — M6281 Muscle weakness (generalized): Secondary | ICD-10-CM | POA: Diagnosis not present

## 2022-01-02 DIAGNOSIS — R293 Abnormal posture: Secondary | ICD-10-CM

## 2022-01-02 NOTE — Therapy (Signed)
OUTPATIENT PHYSICAL THERAPY TREATMENT   Patient Name: [name] MRN: [id number] DOB:10/18/1955, 67 y.o., female Today's Date: 01/02/2022  END OF SESSION:    PT End of Session - 01/02/22 0938     Visit Number 5    Number of Visits 15    Date for PT Re-Evaluation 02/05/22    Authorization Type MCR and tricare    Progress Note Due on Visit 10    PT Start Time 0934    PT Stop Time 1013    PT Time Calculation (min) 39 min    Activity Tolerance Patient tolerated treatment well    Behavior During Therapy [facility] for tasks assessed/performed               Past Medical History:  Diagnosis Date   Anxiety    Bipolar 1 disorder ([place])    Breast density 06/25/2012   Right breast density, will get mammogram and [place]   Depression    Diabetes mellitus without complication (HCC)    type 1   Duodenitis    Fibromyalgia    GERD (gastroesophageal reflux disease)    History of kidney stones    HTN (hypertension)    Hyperlipidemia    IBS (irritable bowel syndrome)    Neuropathy    Sepsis ([place]) 02/27/2019   Sepsis ([place]) 2023   Past Surgical History:  Procedure Laterality Date   ABDOMINAL HYSTERECTOMY     partial   CHOLECYSTECTOMY     COLONOSCOPY     CYSTOSCOPY WITH RETROGRADE PYELOGRAM, URETEROSCOPY AND STENT PLACEMENT Right 03/31/2019   Procedure: CYSTOSCOPY WITH RETROGRADE PYELOGRAM, URETEROSCOPY AND STENT PLACEMENT;  Surgeon: Robley Fries, MD;  Location: [place];  Service: Urology;  Laterality: Right;   CYSTOSCOPY WITH STENT PLACEMENT Right 03/02/2019   Procedure: CYSTOSCOPY WITH STENT PLACEMENT;  Surgeon: Robley Fries, MD;  Location: [place];  Service: Urology;  Laterality: Right;   HOLMIUM LASER APPLICATION Right 01/02/5850   Procedure: HOLMIUM LASER APPLICATION;  Surgeon: Robley Fries, MD;  Location: [facility];  Service: Urology;  Laterality: Right;   UPPER GASTROINTESTINAL ENDOSCOPY     Patient Active Problem List   Diagnosis  Date Noted   Syncope and collapse 12/15/2020   Pure hypercholesterolemia 12/15/2020   Diabetic polyneuropathy ([place]) 03/03/2020   GERD without esophagitis 03/03/2020   Anxiety state    Acute pyelonephritis    Peripheral neuropathy 02/07/2017   Chronic constipation 05/13/2016   Suicide ideation 04/15/2012   LADA (latent autoimmune diabetes in adults), managed as type 1 ([place])    IBS (irritable bowel syndrome)    Essential hypertension    Bipolar 1 disorder (HCC)    Abnormality of gait 11/07/2011    PCP: Redmond School, MD   REFERRING PROVIDER: Melvenia Beam, MD  REFERRING DIAG: M79.18 (ICD-10-CM) - Cervical myofascial pain syndrome 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  Pt indicated some good days and some bad.  Pt indicated headaches aren't as often and not as bad.   PERTINENT HISTORY:  past medical history of neuropathy, hyperlipidemia, hypertension, diabetes, depression, bipolar 1, anxiety, abnormality of gait, muscle weakness, lithium toxicity, suicidal ideation.  Polypharmacy was noted.  PAIN:  NPRS scale: 5/10 Pain location: left side of neck   Pain description: pulsing pain, that goes from her head, to her ear and into left upper trap Aggravating factors: turning her head, it gets bad if she cries Relieving factors: ice, heat  PRECAUTIONS: Fall  WEIGHT BEARING RESTRICTIONS: No  FALLS:  Has patient fallen in last 6 months? Yes. Number of falls 3  OCCUPATION: none  PLOF:  Independent with household mobility with device  PATIENT GOALS: reduce pain  NEXT MD VISIT:   OBJECTIVE:   DIAGNOSTIC FINDINGS:  IMPRESSION: This MRI of the brain with and without contrast shows the following: 1.   No acute findings. 2.   Chronic left cerebellar lacunar infarction, also seen on the 2021 MRI. 3.   Scattered T2/FLAIR hyperintense foci in the subcortical and deep white matter consistent with mild chronic microvascular ischemic change.  None of the foci appear to be acute and there does not appear to be any change compared to the 2021 MRI. 4.   Normal enhancement pattern.  PATIENT SURVEYS:  Eval: FOTO 35% functional, goal is 54%  COGNITION: Overall cognitive status: Within functional limits for tasks assessed  SENSATION: WFL  POSTURE: rounded shoulders and forward head  PALPATION: Tender to palpation with trigger points in left cervical P.S, levator, upper trap    CERVICAL ROM:   Active ROM AROM (deg) eval AROM 12/18/21 AROM 01/02/2022  Flexion 30  WFL  Extension 20  WFL  Right lateral flexion 10 20   Left lateral flexion 10 15   Right rotation 47 60   Left rotation 50 55    (Blank rows = not tested)  UPPER EXTREMITY ROM:  Active ROM Right eval Left eval  Shoulder flexion 150 150  Shoulder extension    Shoulder abduction    Shoulder adduction    Shoulder extension    Shoulder internal rotation    Shoulder external rotation    Elbow flexion    Elbow extension    Wrist flexion    Wrist extension    Wrist ulnar deviation    Wrist radial deviation    Wrist pronation    Wrist supination     (Blank rows = not tested)  UPPER EXTREMITY MMT:  MMT Right eval Left eval  Shoulder flexion 4 4  Shoulder extension    Shoulder abduction 4 4  Shoulder adduction    Shoulder extension    Shoulder internal rotation 5 5  Shoulder external rotation 4+ 4+  Middle trapezius    Lower trapezius    Elbow flexion 5 5  Elbow extension 4 4  Wrist flexion     Wrist extension    Wrist ulnar deviation    Wrist radial deviation    Wrist pronation    Wrist supination    Grip strength     (Blank rows = not tested)  CERVICAL SPECIAL TESTS:  Spurling's test: Negative  FUNCTIONAL TESTS:    TODAY'S TREATMENT:  01/02/2022 Manual therapy for soft tissue mobilization to her cervical paraspinals and upper traps, IASTM with cupping to these areas as well.   (Performed by Ivery Quale  PT, DPT)  Therex: Nu step L5 UE/LE X 10 min Cervical rotation stretch 5 sec X 5 bilat with self OP  Neuro re-ed Lateral stepping in // bars 10 ft x 5 each way Tandem stance in // bars occasional HHA on bar 1 min x 2 bilateral Retro walking at counter top 3 round trips no UE support Walking with head turns in // bars 3 round trips no UE support Walking with head nods n // bars  3 round trips no UE support   12/18/21 Manual therapy for soft tissue mobilization to her cervical paraspinals and upper traps, IASTM with cupping to these areas as well.  Therex: Nu step L5 UE/LE X 9 min Upper trap stretch 10 sec X 5 bilat Levator stretch 10 sec X 3 bilat Cervical rotation stretch 5 sec X 5 bilat with self OP  Neuro re-ed Lateral stepping at counter top 3 round trips no UE support Modified tandem balance 20 sec X 3 bilat with intermit UE support PRN Retro walking at counter top 3 round trips no UE support Walking with head turns at counter top 3 round trips no UE support Walking with head nods at counter top 3 round trips no UE support Walking eyes closed at counter top 3 round trips no UE support Balance on foam feet together eyes open 1.5 min  12/11/21 Manual therapy for soft tissue mobilization to her cervical paraspinals and upper traps, IASTM with graston tool to these areas as well. Then  skilled palpation and Trigger Point Dry-Needling  Treatment instructions: Expect mild to moderate muscle soreness. Patient Consent Given: Yes Education handout provided:  verbally provided Muscles treated: Cervical-thoracic paraspinals and multifidi, upper traps, levator, suboccipitals bilat Treatment response/outcome: good overall tolerance,twitch response noted  Therex: Nu step L5 UE/LE X 9 min HEP review and printed out additional balance exercises to add to HEP  Neuro re-ed Lateral stepping to vectors with cones Modified tandem balance 20 sec X 3 bilat with intermit UE support PRN Aternating foot taps on 6 inch step X 10 bilat with intermit UE support PRN Balance on foam feet together eyes open with intermit UE support PRN Balance feet slightly apart and eyes closed ith intermit UE support PRN     PATIENT EDUCATION: Education details: HEP printout from [employer] educated: Patient Education method: Consulting civil engineer, Media planner, Verbal cues, and Handouts Education comprehension: verbalized understanding and needs further education   HOME EXERCISE PROGRAM: Access Code: [email or web address] URL: [email or web address] Date: 12/11/2021 Prepared by: Elsie Ra  Exercises - Semi-Tandem Balance at Counter Top Eyes Closed  - 2 x daily - 6 x weekly - 1 sets - 3 reps - 20 sec hold - Feet Together Balance at [employer] Eyes Closed  - 2 x daily - 6 x weekly - 1 sets - 5 reps - 10 sec hold - Heel Toe Raises with Counter Support  - 2 x daily - 6 x weekly - 2 sets - 10 reps - Side Stepping with Unilateral Counter Support  - 2 x daily - 6 x weekly - 1-2 sets - 10 reps - Seated Cervical Retraction  - 2-3 x daily - 7 x weekly - 1 sets - 10 reps - Seated Scapular Retraction  - 2-3 x daily - 7 x weekly - 1 sets - 10 reps - 3-5 hold - Seated Assisted Cervical Rotation with Towel  - 2-3 x daily - 7 x weekly - 1 sets - 10 reps ASSESSMENT:  CLINICAL IMPRESSION:  Fair  to good balance in static non compliant surface balance activity today.   Continued complaints of neck/headache complaints but per report, she seemed to be having less frequent and severe  trouble over last few weeks.  Most likely due to combination of treatment, home TENS unit and Pt reported lyrica helping.    OBJECTIVE IMPAIRMENTS: decreased activity tolerance, decreased shoulder mobility, decreased ROM, decreased strength, impaired flexibility, impaired UE use, postural dysfunction, and pain.  ACTIVITY LIMITATIONS: reaching, lifting, carry,  cleaning, driving, and or occupation  PERSONAL FACTORS: past medical history of neuropathy, hyperlipidemia, hypertension, diabetes, depression, bipolar 1, anxiety, abnormality of gait, muscle weakness, lithium toxicity, suicidal ideation.  Polypharmacy was noted. also affecting patient's functional outcome.  REHAB POTENTIAL: Good  CLINICAL DECISION MAKING: moderate  EVALUATION COMPLEXITY: moderate    GOALS: Short term PT Goals Target date: 12/11/2021 Pt will be I and compliant with HEP. Baseline:  Goal status: on going - 11/20/2021 Pt will decrease pain by 25% overall Baseline: Goal status: on going - 11/20/2021  Long term PT goals Target date: 02/05/2022 Pt will improve neck AROM to [facility] to improve functional reaching Baseline: Goal status: New Pt will improve FOTO to at least 54% functional to show improved function Baseline: Goal status: New Pt will reduce pain to overall less than 3/10 with usual activity and turning her head. Baseline: Goal status: New  PLAN: PT FREQUENCY: 1-2 times per week   PT DURATION: 12 weeks  PLANNED INTERVENTIONS (unless contraindicated): aquatic PT, Canalith repositioning, cryotherapy, Electrical stimulation, Iontophoresis with 4 mg/ml dexamethasome, Moist heat, traction, Ultrasound, gait training, Therapeutic exercise, balance training, neuromuscular re-education, patient/family education, prosthetic training, manual techniques, passive ROM, dry needling, taping, vasopnuematic device, vestibular, spinal manipulations, joint manipulations  PLAN FOR NEXT SESSION: Continue encouragement for  HEP, how was cupping STM and repeat if desired. Gait/balance interventions. She now has home TENS so defer TENS treatment in clinic.     Chyrel Masson, PT, DPT, OCS, ATC 01/02/22  10:14 AM

## 2022-01-08 ENCOUNTER — Encounter: Payer: Self-pay | Admitting: Rehabilitative and Restorative Service Providers"

## 2022-01-08 ENCOUNTER — Ambulatory Visit (INDEPENDENT_AMBULATORY_CARE_PROVIDER_SITE_OTHER): Payer: Medicare Other | Admitting: Physical Therapy

## 2022-01-08 DIAGNOSIS — R2689 Other abnormalities of gait and mobility: Secondary | ICD-10-CM

## 2022-01-08 DIAGNOSIS — M6281 Muscle weakness (generalized): Secondary | ICD-10-CM | POA: Diagnosis not present

## 2022-01-08 DIAGNOSIS — R293 Abnormal posture: Secondary | ICD-10-CM

## 2022-01-08 DIAGNOSIS — M542 Cervicalgia: Secondary | ICD-10-CM

## 2022-01-08 NOTE — Therapy (Signed)
OUTPATIENT PHYSICAL THERAPY TREATMENT   Patient Name: JAELYNE DEEG MRN: [id number] DOB:02-22-1955, 67 y.o., female Today's Date: 01/08/2022  END OF SESSION:    PT End of Session - 01/08/22 1011     Visit Number 6    Number of Visits 15    Date for PT Re-Evaluation 02/05/22    Authorization Type MCR and tricare    Progress Note Due on Visit 10    PT Start Time 0930    PT Stop Time 1000    PT Time Calculation (min) 30 min    Activity Tolerance Patient tolerated treatment well    Behavior During Therapy WFL for tasks assessed/performed               Past Medical History:  Diagnosis Date   Anxiety    Bipolar 1 disorder (HCC)    Breast density 06/25/2012   Right breast density, will get mammogram and [place]   Depression    Diabetes mellitus without complication (HCC)    type 1   Duodenitis    Fibromyalgia    GERD (gastroesophageal reflux disease)    History of kidney stones    HTN (hypertension)    Hyperlipidemia    IBS (irritable bowel syndrome)    Neuropathy    Sepsis (HCC) 02/27/2019   Sepsis (HCC) 2023   Past Surgical History:  Procedure Laterality Date   ABDOMINAL HYSTERECTOMY     partial   CHOLECYSTECTOMY     COLONOSCOPY     CYSTOSCOPY WITH RETROGRADE PYELOGRAM, URETEROSCOPY AND STENT PLACEMENT Right 03/31/2019   Procedure: CYSTOSCOPY WITH RETROGRADE PYELOGRAM, URETEROSCOPY AND STENT PLACEMENT;  Surgeon: Noel Christmas, MD;  Location: [facility];  Service: Urology;  Laterality: Right;   CYSTOSCOPY WITH STENT PLACEMENT Right 03/02/2019   Procedure: CYSTOSCOPY WITH STENT PLACEMENT;  Surgeon: Noel Christmas, MD;  Location: [facility];  Service: Urology;  Laterality: Right;   HOLMIUM LASER APPLICATION Right 03/31/2019   Procedure: HOLMIUM LASER APPLICATION;  Surgeon: Noel Christmas, MD;  Location: [facility];  Service: Urology;  Laterality: Right;   UPPER GASTROINTESTINAL ENDOSCOPY     Patient Active Problem List   Diagnosis  Date Noted   Syncope and collapse 12/15/2020   Pure hypercholesterolemia 12/15/2020   Diabetic polyneuropathy (HCC) 03/03/2020   GERD without esophagitis 03/03/2020   Anxiety state    Acute pyelonephritis    Peripheral neuropathy 02/07/2017   Chronic constipation 05/13/2016   Suicide ideation 04/15/2012   LADA (latent autoimmune diabetes in adults), managed as type 1 (HCC)    IBS (irritable bowel syndrome)    Essential hypertension    Bipolar 1 disorder (HCC)    Abnormality of gait 11/07/2011    PCP: Elfredia Nevins, MD   REFERRING PROVIDER: Anson Fret, MD  REFERRING DIAG: M79.18 (ICD-10-CM) - Cervical myofascial pain syndrome 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   PERTINENT HISTORY:  past medical history of neuropathy, hyperlipidemia, hypertension, diabetes, depression, bipolar 1, anxiety, abnormality of gait, muscle weakness, lithium toxicity, suicidal ideation.  Polypharmacy was noted.  PAIN:  NPRS scale: 9/10 Pain location: left side of neck   Pain description: pulsing pain, that goes from her head, to her ear and into left upper trap Aggravating factors: turning her head, it gets bad if she cries Relieving factors: ice, heat  PRECAUTIONS: Fall  WEIGHT BEARING RESTRICTIONS: No  FALLS:  Has patient fallen in last 6  months? Yes. Number of falls 3  OCCUPATION: none  PLOF: Independent with household mobility with device  PATIENT GOALS: reduce pain  NEXT MD VISIT:   OBJECTIVE:   DIAGNOSTIC FINDINGS:  IMPRESSION: This MRI of the brain with and without contrast shows the following: 1.   No acute findings. 2.   Chronic left cerebellar lacunar infarction, also seen on the 2021 MRI. 3.   Scattered T2/FLAIR hyperintense foci in the subcortical and deep white matter consistent with mild chronic microvascular ischemic change.  None of the foci appear to be acute and there does not appear to be any change compared to the 2021 MRI. 4.   Normal enhancement pattern.  PATIENT SURVEYS:  Eval: FOTO 35% functional, goal is 54%  COGNITION: Overall cognitive status: Within functional limits for tasks assessed  SENSATION: WFL  POSTURE: rounded shoulders and forward head  PALPATION: Tender to palpation with trigger points in left cervical P.S, levator, upper trap    CERVICAL ROM:   Active ROM AROM (deg) eval AROM 12/18/21 AROM 01/02/2022  Flexion 30  WFL  Extension 20  WFL  Right lateral flexion 10 20   Left lateral flexion 10 15   Right rotation 47 60   Left rotation 50 55    (Blank rows = not tested)  UPPER EXTREMITY ROM:  Active ROM Right eval Left eval  Shoulder flexion 150 150  Shoulder extension    Shoulder abduction    Shoulder adduction    Shoulder extension    Shoulder internal rotation    Shoulder external rotation    Elbow flexion    Elbow extension    Wrist flexion    Wrist extension    Wrist ulnar deviation    Wrist radial deviation    Wrist pronation    Wrist supination     (Blank rows = not tested)  UPPER EXTREMITY MMT:  MMT Right eval Left eval  Shoulder flexion 4 4  Shoulder extension    Shoulder abduction 4 4  Shoulder adduction    Shoulder extension    Shoulder internal rotation 5 5  Shoulder external rotation 4+ 4+  Middle trapezius    Lower trapezius     Elbow flexion 5 5  Elbow extension 4 4  Wrist flexion    Wrist extension    Wrist ulnar deviation    Wrist radial deviation    Wrist pronation    Wrist supination    Grip strength     (Blank rows = not tested)  CERVICAL SPECIAL TESTS:  Spurling's test: Negative  FUNCTIONAL TESTS:    TODAY'S TREATMENT:  01/08/22 Manual therapy for soft tissue mobilization to her cervical paraspinals and upper traps, IASTM with cupping to these areas as  well. Trigger pint release pin and stretch technique to left upper trap and levator.   Self care Educated her on home recommendation for theracane as well as home cupping set for self trigger point release and soft tissue work at home.   01/02/2022 Manual therapy for soft tissue mobilization to her cervical paraspinals and upper traps, IASTM with cupping to these areas as well.   (Performed by Ivery Quale PT, DPT)  Therex: Nu step L5 UE/LE X 10 min Cervical rotation stretch 5 sec X 5 bilat with self OP  Neuro re-ed Lateral stepping in // bars 10 ft x 5 each way Tandem stance in // bars occasional HHA on bar 1 min x 2 bilateral Retro walking at counter top 3 round trips no UE support Walking with head turns in // bars 3 round trips no UE support Walking with head nods n // bars  3 round trips no UE support   12/18/21 Manual therapy for soft tissue mobilization to her cervical paraspinals and upper traps, IASTM with cupping to these areas as well.  Therex: Nu step L5 UE/LE X 9 min Upper trap stretch 10 sec X 5 bilat Levator stretch 10 sec X 3 bilat Cervical rotation stretch 5 sec X 5 bilat with self OP  Neuro re-ed Lateral stepping at counter top 3 round trips no UE support Modified tandem balance 20 sec X 3 bilat with intermit UE support PRN Retro walking at counter top 3 round trips no UE support Walking with head turns at counter top 3 round trips no UE support Walking with head nods at counter top 3 round trips no UE  support Walking eyes closed at counter top 3 round trips no UE support Balance on foam feet together eyes open 1.5 min    PATIENT EDUCATION: Education details: HEP printout from [employer] Person educated: Patient Education method: Programmer, multimedia, Facilities manager, Verbal cues, and Handouts Education comprehension: verbalized understanding and needs further education   HOME EXERCISE PROGRAM: Access Code: [email or web address] URL: [email or web address] Date: 12/11/2021 Prepared by: Ivery Quale  Exercises - Semi-Tandem Balance at Counter Top Eyes Closed  - 2 x daily - 6 x weekly - 1 sets - 3 reps - 20 sec hold - Feet Together Balance at [employer] Eyes Closed  - 2 x daily - 6 x weekly - 1 sets - 5 reps - 10 sec hold - Heel Toe Raises with Counter Support  - 2 x daily - 6 x weekly - 2 sets - 10 reps - Side Stepping with Unilateral Counter Support  - 2 x daily - 6 x weekly - 1-2 sets - 10 reps - Seated Cervical Retraction  - 2-3 x daily - 7 x weekly - 1 sets - 10 reps - Seated Scapular Retraction  - 2-3 x daily - 7 x weekly - 1 sets - 10 reps - 3-5 hold - Seated Assisted Cervical Rotation with Towel  - 2-3 x daily - 7 x weekly - 1 sets - 10 reps ASSESSMENT:  CLINICAL IMPRESSION:  She was limited with activity tolerance due to severe headache reported and deferred any of the exercises. She did request manual therapy and this provided her with some relief to her left neck/shoulder area which is much more tight than her Right side. I showed her home recommendations from [facility] she can order so she can perform this at home as well. She did not have any more visits scheduled so was encouraged to set up more if she feels  she needs to.    OBJECTIVE IMPAIRMENTS: decreased activity tolerance, decreased shoulder mobility, decreased ROM, decreased strength, impaired flexibility, impaired UE use, postural dysfunction, and pain.  ACTIVITY LIMITATIONS: reaching, lifting, carry,  cleaning, driving, and  or occupation  PERSONAL FACTORS: past medical history of neuropathy, hyperlipidemia, hypertension, diabetes, depression, bipolar 1, anxiety, abnormality of gait, muscle weakness, lithium toxicity, suicidal ideation.  Polypharmacy was noted. also affecting patient's functional outcome.  REHAB POTENTIAL: Good  CLINICAL DECISION MAKING: moderate  EVALUATION COMPLEXITY: moderate    GOALS: Short term PT Goals Target date: 12/11/2021 Pt will be I and compliant with HEP. Baseline:  Goal status: on going - 11/20/2021 Pt will decrease pain by 25% overall Baseline: Goal status: on going - 11/20/2021  Long term PT goals Target date: 02/05/2022 Pt will improve neck AROM to [facility] to improve functional reaching Baseline: Goal status: ongoing Pt will improve FOTO to at least 54% functional to show improved function Baseline: Goal status: ongoing Pt will reduce pain to overall less than 3/10 with usual activity and turning her head. Baseline: Goal status: ongoing  PLAN: PT FREQUENCY: 1-2 times per week   PT DURATION: 12 weeks  PLANNED INTERVENTIONS (unless contraindicated): aquatic PT, Canalith repositioning, cryotherapy, Electrical stimulation, Iontophoresis with 4 mg/ml dexamethasome, Moist heat, traction, Ultrasound, gait training, Therapeutic exercise, balance training, neuromuscular re-education, patient/family education, prosthetic training, manual techniques, passive ROM, dry needling, taping, vasopnuematic device, vestibular, spinal manipulations, joint manipulations  PLAN FOR NEXT SESSION: Continue encouragement for HEP, how was cupping STM and repeat if desired. Gait/balance interventions. She now has home TENS so defer TENS treatment in clinic.    Elsie Ra, PT, DPT 01/08/22 10:27 AM

## 2022-01-09 ENCOUNTER — Ambulatory Visit (INDEPENDENT_AMBULATORY_CARE_PROVIDER_SITE_OTHER): Payer: Medicare Other | Admitting: Neurology

## 2022-01-09 ENCOUNTER — Telehealth: Payer: Self-pay | Admitting: Neurology

## 2022-01-09 ENCOUNTER — Encounter: Payer: Self-pay | Admitting: Neurology

## 2022-01-09 VITALS — BP 171/103 | HR 104 | Ht 64.0 in | Wt 172.0 lb

## 2022-01-09 DIAGNOSIS — M7918 Myalgia, other site: Secondary | ICD-10-CM

## 2022-01-09 DIAGNOSIS — R52 Pain, unspecified: Secondary | ICD-10-CM | POA: Diagnosis not present

## 2022-01-09 DIAGNOSIS — M542 Cervicalgia: Secondary | ICD-10-CM

## 2022-01-09 DIAGNOSIS — R29898 Other symptoms and signs involving the musculoskeletal system: Secondary | ICD-10-CM

## 2022-01-09 DIAGNOSIS — R270 Ataxia, unspecified: Secondary | ICD-10-CM | POA: Diagnosis not present

## 2022-01-09 DIAGNOSIS — M5412 Radiculopathy, cervical region: Secondary | ICD-10-CM

## 2022-01-09 DIAGNOSIS — M2480 Other specific joint derangements of unspecified joint, not elsewhere classified: Secondary | ICD-10-CM | POA: Diagnosis not present

## 2022-01-09 DIAGNOSIS — R2689 Other abnormalities of gait and mobility: Secondary | ICD-10-CM

## 2022-01-09 MED ORDER — LIDOCAINE HCL 2 % IJ SOLN: 1.0000 mL | Freq: Once | INTRAMUSCULAR | Status: AC

## 2022-01-09 MED ORDER — BUPIVACAINE HCL 0.5 % IJ SOLN: 1.0000 mL | Freq: Once | INTRAMUSCULAR | Status: AC

## 2022-01-09 MED ORDER — METHYLPREDNISOLONE ACETATE 80 MG/ML IJ SUSP: 80.0000 mg | Freq: Once | INTRAMUSCULAR | Status: AC

## 2022-01-09 NOTE — Telephone Encounter (Signed)
medicare/tricare NPR sent to GI [phone] 

## 2022-01-09 NOTE — Patient Instructions (Signed)
MRI cervical spine Pain management

## 2022-01-09 NOTE — Progress Notes (Signed)
[facility]    Provider:  Dr Lucia GaskinsAhern Requesting Provider: [name], [name], MD Primary Care Provider:  [name], [name], MD  CC:  syncope and muscle spasms  01/09/2022: Extensive psychiatric history, multiple neurologic complaints over the years we have seen her, workups have been negative, today has neck pain, back of head pain. Multiple brain images have been stable, MRI cervical spine normal 2021. Going to PT for cervical myofascial pain, occipital neuralgia, cervicalgia last 01/08/2022.  Patient is already on a multitude of medications we use for pain in this condition including including muscle relaxers, nortriptyline, Tylenol and analgesics, Lyrica, amitriptyline,   Here with her husband, taking Lyrica, radiating into her back, her neck hurts for 2 months off and on and seriously for 2 months, shoots down the shoulders, tight muscles, shoots into the back of the head. Very tight paraspinal muscles, hurts to palpate muscles on the left, ice packs help, she has a circular red patch left cervical paraspinal husband says she has had ice pack there. She has a rash right where the pain is. Severe pain. Pain is all muscular. Will perform nerve blocks and trigger point injections. Also referred for pain management.  CT head 10/03/2021: FINDINGS: Brain: The brain shows a normal appearance without evidence of malformation, atrophy, old or acute small or large vessel infarction, mass lesion, hemorrhage, hydrocephalus or extra-axial collection.   Vascular: No hyperdense vessel. No evidence of atherosclerotic calcification.   Skull: Normal.  No traumatic finding.  No focal bone lesion.   Sinuses/Orbits: Sinuses are clear. Orbits appear normal. Mastoids are clear.   Other: None significant   IMPRESSION: Normal head CT.  MRI cervical spine: 07/30/2019  FINDINGS:  The cervical vertebrae demonstrate abnormal alignment with loss of forward lordotic curvature with posterior  subluxation of C2 and C3 over C2 vertebrae but normal body height and marrow signal characteristics.  Intervertebral discs only minor disc signal abnormalities but there is no frank disc herniation cord compression significant due to foraminal encroachment.  Spinal cord parenchyma shows no significant abnormalities.  There are some ill-defined hyperintensities on the sagittal images but no corresponding changes are noted on the axial images and these are likely artifacts.  Visualized portion of the lower brainstem, craniovertebral junction and upper thoracic spine appear unremarkable.  Paraspinal soft tissues show no significant abnormalities.         IMPRESSION: Unremarkable MRI scan cervical spine without contrast.   MRI brain 03/02/2021:  IMPRESSION: This MRI of the brain with and without contrast shows the following: 1.   No acute findings. 2.   Chronic left cerebellar lacunar infarction, also seen on the 2021 MRI. 3.   Scattered T2/FLAIR hyperintense foci in the subcortical and deep white matter consistent with mild chronic microvascular ischemic change.  None of the foci appear to be acute and there does not appear to be any change compared to the 2021 MRI. 4.   Normal enhancement pattern.     HP 02/27/2021: Patient is a 67 year old female who I saw in 2021 and in and in 2018 for different chief complaint of short-term memory loss, fibromyalgia.  She has a past medical history of neuropathy, hyperlipidemia, hypertension, diabetes, depression, bipolar 1, anxiety, abnormality of gait, muscle weakness, lithium toxicity, suicidal ideation.  Polypharmacy was noted.  I did not believe there was a neurodegenerative disorder, she had some parkinsonian signs likely due to history of dopaminergic medication but an MRI of the brain and the cervical spine was ordered which showed no  etiologies for memory loss or any gait abnormalities, recent CT head was also unremarkable.  I reviewed Dr. Nolon Rod notes, she  presented with syncope, abdominal cramps, diabetes and sinusitis, episode of driving with syncope, no seizures or residual, unwitnessed.  Of note I see a diagnosis of "Parkinson's disease" on her medication list, I am not aware that she has been diagnosed with that or that I diagnosed her with it, at one of my appointment she had some extraparametal symptoms likely due to her antidopaminergic drugs but she was never diagnosed with Parkinson's disease as far as I know.  I reviewed her blood work which was collected October 2022 which showed CBC with differential unremarkable, CMP showed low glucose at 48, BUN 14, creatinine 0.75 otherwise normal, TSH was normal 1.640.   She will be walking and wake up on the floor x 2. She lost consciousness maybe for a second. She was just walking down the hallway, felt like someone pushed her, not lightheaded, no warning, she remember pain of hitting her head. No urination, no defecation, it happened 6 months ago. And last night, she was walking and started going down and husband tried to catch her, she says she dd NOT lose consciousness, you got right back up, she says she just dropped, no tripping, no imbalance, she doesn't really know why, but never lost consciousness and afterwards no confusion, no alteration of awareness, she wore a monitor and had an echocardiogram. She has dry mouth, she says that is why she feels her throat is closing, she went to ENT, she saw Candee Furbish and per his notes". Normal pump function, mild increased LV wall thickness. Small pericardial effusion - no clinical consequence." She saw Dr. Marlou Porch in Cardiology as recent as 12/2020 and had evaluation. She denies gettting anything stuck in her throat or coughing, if she takes things with water, she feels the muscles in her neck squeeze but no choking or aspiration.   CT head 07/2020 IMPRESSION: Personally reviewed images and agree with the following 1. No acute intracranial abnormalities. 2.  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 3. No evidence for facial bone fracture.  Patient complains of symptoms per HPI as well as the following symptoms: throat spasms . Pertinent negatives and positives per HPI. All others negative   CC:  Memory loss  HPI 7/021:  [name] is a female here as requested by Redmond School, MD for short term memory loss.  Past medical history neuropathy, hyperlipidemia, hypertension, diabetes, depression, bipolar 1 disorder, anxiety, abnormality of gait, muscle weakness, lithium toxicity, suicidal ideation.  She was seen in neurology over 3 years ago, at that time she denied any issues, I reviewed those notes, she had some memory issues but felt this was age-related due to lots of medications and bipolar disorder, she reported repeating things, denied getting lost, has been paying the bills and he had to take over 10 years ago after developing bipolar disorder, no alcohol or past drug use other than marijuana, she writes things down a lot and that helps her remember appointments, she denied problems cooking and no accidents in the home.  At that time her MMSE was 28 out of 30.  Examination was unremarkable.  She was asked to follow-up in several months but never did.  Neurologic examination at that time in 2018 was nonfocal.  It appears she was recently admitted to [facility] in March of this year, discharge diagnosis urosepsis secondary to E. coli bacteremia  in the setting of obstructing nephrolithiasis.  She is on multiple medications that can cause memory loss such as desipramine, gabapentin, benzodiazepines, hydrocodone, meclizine, nortriptyline, trazodone per a review of hospital discharge summary.   I also reviewed notes from Dr. Sherwood GamblerFusco, last B12 was 347 May 06, 2019, at the same time white blood cells were normal, sodium was normal, potassium normal, platelets normal, hemoglobin 10.5 slightly low, glucose was elevated 194, folate was  normal 16.3, ferritin was low at 7, creatinine 0.68, EGFR 91 mL/min normal, BUN 25, AST/ALT/alk phos normal.  On May 06, 2019, it appears at that time she had a urine infection based on the labs, she reported 6 falls in the prior year, at that time she was on amitriptyline, alprazolam, desipramine, nortriptyline, gabapentin, meclizine, trazodone (and other medication) which are medications that can interfere with memory.  Dr. Charlsie QuestFuhs goes exam showed a well-appearing well-nourished patient in no distress, normal eyes, neck, heart, lungs, extremities, focused neurologic and for psychiatric evaluation he stated intact memory, judgment and insight, normal mood and affect.  Patient is here with her husband today who also provides much information.  She has short-term memory loss, she had several falls, Dr. Sherwood GamblerFusco was concerned that there may be "pressure on the brain", husband states the patient gets rattled sometimes but the patient states lots of people have short-term memory loss, 6 falls in the past year, today MMSE 21 out of 30 with 10 animals. She states she does have more short-term memory loss, she can't retain many things, but she remember remote details, she denies dementia in her 69 year old mother but states "they were all crazy" and mother was violent at the end. Her father took amitriptyline for many years (not sure why she states this), husband says "he doesn't want me to say a lot". He says her whole family has depression, brothers and sisters and parents extended family with mood disorders and extensive psychiatric disease, she has a tendency to set things down and can't find it, she will spend an hour trying to find it, her brain "doesn't hold everything", patient was confused about why she was here today thought it was for follow up for her kidney infection. Husband has paid all the bills for 10 years, she has impulsive spending, she can go to the grocery and fine paying or at a restaurant, she takes her  own medications, she rarely misses her meds (husband agrees), she says she feels angry sometimes, she wants to "talk crap to everyone" but husband says she has been nicer the last few months and she contributes it to colace and resolution of constipation. Husband is more concerned with her aggravation. She has not fallen for 2 months, she fell backwards once when getting out of the car, once she fell in the hallway, husband says it has been glucose and blood pressure dropping. Husband says once she got tangled in her feet and if she gets up too fast her head with "swim" and she gets vertigo, since getting her blood pressure under control and working on diet she has not fallen.  She states that over the years she has been on multiple anti-psychotics such as abilify and zyprexa. Has not been to PT.   Reviewed notes, labs and imaging from outside physicians, which showed:  In March 2018 HIV, B12, MMA, RPR were all unremarkable.  CT head 2020 showed No acute intracranial abnormalities including mass lesion or mass effect, hydrocephalus, extra-axial fluid collection, midline shift, hemorrhage, or  acute infarction, large ischemic events (personally reviewed images).  Her ventricles were normal, I did not appreciate any hydrocephalus or enlargement of the ventricles.  Review of Systems: Patient complains of symptoms per HPI as well as the following symptoms depression. Pertinent negatives and positives per HPI. All others negative.   Social History   Socioeconomic History   Marital status: Married    Spouse name: phillip   Number of children: 1   Years of education: 12   Highest education level: High school graduate  Occupational History   Occupation: Disabled  Tobacco Use   Smoking status: Never   Smokeless tobacco: Never  Vaping Use   Vaping Use: Some days   Substances: Flavoring  Substance and Sexual Activity   Alcohol use: No    Alcohol/week: 0.0 standard drinks of alcohol   Drug use: No    Sexual activity: Not Currently  Other Topics Concern   Not on file  Social History Narrative   Lives at home w/ her husband   Left-handed   Caffeine: 1-2 cups of coffee daily   Social Determinants of Health   Financial Resource Strain: Low Risk  (12/07/2016)   Overall Financial Resource Strain (CARDIA)    Difficulty of Paying Living Expenses: Not hard at all  Food Insecurity: No Food Insecurity (12/07/2016)   Hunger Vital Sign    Worried About Running Out of Food in the Last Year: Never true    Ran Out of Food in the Last Year: Never true  Transportation Needs: Unmet Transportation Needs (12/07/2016)   PRAPARE - Transportation    Lack of Transportation (Medical): Yes    Lack of Transportation (Non-Medical): Yes  Physical Activity: Inactive (12/07/2016)   Exercise Vital Sign    Days of Exercise per Week: 0 days    Minutes of Exercise per Session: 0 min  Stress: Not on file  Social Connections: Somewhat Isolated (12/07/2016)   Social Connection and Isolation Panel [NHANES]    Frequency of Communication with Friends and Family: Never    Frequency of Social Gatherings with Friends and Family: Never    Attends Religious Services: More than 4 times per year    Active Member of [employer] or Organizations: No    Attends BankerClub or Organization Meetings: Never    Marital Status: Married  Catering managerntimate Partner Violence: Not At Risk (12/07/2016)   Humiliation, Afraid, Rape, and Kick questionnaire    Fear of Current or Ex-Partner: No    Emotionally Abused: No    Physically Abused: No    Sexually Abused: No    Family History  Problem Relation Age of Onset   Hypertension Mother    Bipolar disorder Mother    Cancer Mother    Depression Father    Cancer Father    Thyroid disease Sister    Hypertension Sister    Bipolar disorder Sister    Heart disease Brother    Bipolar disorder Maternal Aunt    Bipolar disorder Daughter    Colon cancer Neg Hx    Colon polyps Neg Hx    Esophageal cancer Neg  Hx    Kidney disease Neg Hx    Gallbladder disease Neg Hx    Diabetes Neg Hx    Dementia Neg Hx    Tremor Neg Hx    Seizures Neg Hx    Migraines Neg Hx    Headache Neg Hx     Past Medical History:  Diagnosis Date   Anxiety    Bipolar  1 disorder (HCC)    Breast density 06/25/2012   Right breast density, will get mammogram and [place]   Depression    Diabetes mellitus without complication (HCC)    type 1   Duodenitis    Fibromyalgia    GERD (gastroesophageal reflux disease)    History of kidney stones    HTN (hypertension)    Hyperlipidemia    IBS (irritable bowel syndrome)    Neuropathy    Sepsis (HCC) 02/27/2019   Sepsis (HCC) 2023    Patient Active Problem List   Diagnosis Date Noted   Syncope and collapse 12/15/2020   Pure hypercholesterolemia 12/15/2020   Diabetic polyneuropathy (HCC) 03/03/2020   GERD without esophagitis 03/03/2020   Anxiety state    Acute pyelonephritis    Peripheral neuropathy 02/07/2017   Chronic constipation 05/13/2016   Suicide ideation 04/15/2012   LADA (latent autoimmune diabetes in adults), managed as type 1 (HCC)    IBS (irritable bowel syndrome)    Essential hypertension    Bipolar 1 disorder (HCC)    Abnormality of gait 11/07/2011    Past Surgical History:  Procedure Laterality Date   ABDOMINAL HYSTERECTOMY     partial   CHOLECYSTECTOMY     COLONOSCOPY     CYSTOSCOPY WITH RETROGRADE PYELOGRAM, URETEROSCOPY AND STENT PLACEMENT Right 03/31/2019   Procedure: CYSTOSCOPY WITH RETROGRADE PYELOGRAM, URETEROSCOPY AND STENT PLACEMENT;  Surgeon: Noel Christmas, MD;  Location: [facility];  Service: Urology;  Laterality: Right;   CYSTOSCOPY WITH STENT PLACEMENT Right 03/02/2019   Procedure: CYSTOSCOPY WITH STENT PLACEMENT;  Surgeon: Noel Christmas, MD;  Location[facility];  Service: Urology;  Laterality: Right;   HOLMIUM LASER APPLICATION Right 03/31/2019   Procedure: HOLMIUM LASER APPLICATION;  Surgeon: Noel Christmas, MD;   Location: [facility];  Service: Urology;  Laterality: Right;   UPPER GASTROINTESTINAL ENDOSCOPY      Current Outpatient Medications  Medication Sig Dispense Refill   acetaminophen (TYLENOL) 500 MG tablet Take 1,000 mg by mouth every 6 (six) hours as needed for mild pain or headache.     ALPRAZolam (XANAX) 1 MG tablet Take 1 mg by mouth 4 (four) times daily as needed for anxiety.      AMBULATORY NON FORMULARY MEDICATION Squatty Potty x 1 1 each 0   amitriptyline (ELAVIL) 25 MG tablet Take 25 mg by mouth at bedtime.     amLODipine (NORVASC) 5 MG tablet Take 5 mg by mouth daily.     aspirin EC 81 MG tablet Take 1 tablet (81 mg total) by mouth daily. Swallow whole. 30 tablet 11   BD PEN NEEDLE NANO U/F 32G X 4 MM MISC USE WITH INSULIN PEN FOUR TIMES A DAY 400 each 3   BENICAR 40 MG tablet Take 40 mg by mouth daily.     busPIRone (BUSPAR) 10 MG tablet Take 10 mg by mouth in the morning and at bedtime.     Butalbital-APAP-Caffeine 50-325-40 MG capsule Take 1 capsule by mouth 4 (four) times daily as needed.     cholecalciferol (VITAMIN D3) 25 MCG (1000 UNIT) tablet Take 1,000 Units by mouth daily.     Continuous Blood Gluc Receiver (DEXCOM G6 RECEIVER) DEVI Use as instructed to check blood sugar. 1 each 0   Continuous Blood Gluc Sensor (DEXCOM G6 SENSOR) MISC Use as instructed to check blood sugar change every 10 days 9 each 3   Continuous Blood Gluc Transmit (DEXCOM G6 TRANSMITTER) MISC Use as  instructed to check blood sugar. Change every 90 days 1 each 3   Docusate Sodium (COLACE PO) Take 100 mg by mouth daily.     esomeprazole (NEXIUM) 40 MG capsule Take 40 mg by mouth 2 (two) times daily.     ezetimibe (ZETIA) 10 MG tablet Take 10 mg by mouth daily.     Glucagon (BAQSIMI ONE PACK) 3 MG/DOSE POWD Use 1 spray in the nose as needed for hypoglycemia 1 each PRN   glucose blood (FREESTYLE LITE) test strip USE TO CHECK BLOOD SUGAR 6 TIMES PER DAY 600 each 3   insulin glargine (LANTUS  SOLOSTAR) 100 UNIT/ML Solostar Pen INJECT 45 UNITS UNDER THE SKIN DAILY AS ADVISED 30 mL 4   Insulin Syringes, Disposable, U-100 1 ML MISC To use for insulin injection.. 100 each 1   Lancets (FREESTYLE) lancets USE TO TEST BLOOD SUGAR 4 TO 6 TIMES DAILY AS INSTRUCTED 300 each 6   losartan (COZAAR) 25 MG tablet Take 25 mg by mouth daily.     metFORMIN (GLUCOPHAGE) 500 MG tablet TAKE 1 TABLET TWICE A DAY WITH MEALS 180 tablet 3   methocarbamol (ROBAXIN) 500 MG tablet Take 500 mg by mouth 4 (four) times daily as needed.     nortriptyline (PAMELOR) 50 MG capsule Take 50 mg by mouth at bedtime.      NOVOLOG FLEXPEN 100 UNIT/ML FlexPen INJECT 8 TO 10 UNITS UNDER THE SKIN AT THE START OF THE THREE MAIN MEALS AS DIRECTED 30 mL 3   ondansetron (ZOFRAN-ODT) 4 MG disintegrating tablet PLACE 1 TO 2 TABLETS ON TONGUE EVERY 4 TO 6 HOURS AS NEEDED FOR NAUSEA (Patient taking differently: Take 4-8 mg by mouth every 4 (four) hours as needed for nausea or vomiting.) 180 tablet 4   OSCIMIN 0.125 MG SUBL DISSOLVE 1 TABLET UNDER THE TONGUE EVERY 4 HOURS AS NEEDED (NEED APPOINTMENT) 360 tablet 0   pregabalin (LYRICA) 50 MG capsule Start with twice daily. If no side effects in 1 week may increase to 3x daily. Do not take with gabapentin, stop gabapentin, 90 capsule 2   Probiotic CAPS Take 1 capsule by mouth daily. 30 capsule 1   Propylene Glycol (SYSTANE BALANCE OP) Place 1 drop into both eyes daily as needed (dry eyes).     traZODone (DESYREL) 100 MG tablet 1  qhs  May increse to 2  qhs  After 1 week (Patient taking differently: Take 100 mg by mouth at bedtime. 1  qhs  May increse to 2  qhs  After 1 week) 180 tablet 1   venlafaxine (EFFEXOR) 37.5 MG tablet Take 37.5 mg by mouth daily.     vitamin B-12 (CYANOCOBALAMIN) 1000 MCG tablet Take 1,000 mcg by mouth daily.     No current facility-administered medications for this visit.    Allergies as of 01/09/2022 - Review Complete 01/09/2022  Allergen Reaction Noted    Abilify [aripiprazole]  08/10/2016   Phenergan [promethazine] Diarrhea and Nausea And Vomiting 07/08/2019   Reglan [metoclopramide] Other (See Comments) 03/23/2013    Vitals: BP (!) 171/103   Pulse (!) 104   Ht 5\' 4"  (1.626 m)   Wt 172 lb (78 kg)   BMI 29.52 kg/m  Last Weight:  Wt Readings from Last 1 Encounters:  01/09/22 172 lb (78 kg)   Last Height:   Ht Readings from Last 1 Encounters:  01/09/22 5\' 4"  (1.626 m)     Exam: Musc: Tight cervical muscles, decreased ROM neck, pain on palpation  of the cervical paraspinals            Speech:    Speech is normal; fluent and spontaneous with normal comprehension.  Cognition:    The patient is oriented to person, place, and time;     recent and remote memory intact;     language fluent;    Cranial Nerves:    The pupils are equal, round, and reactive to light.Trigeminal sensation is intact and the muscles of mastication are normal. The face is symmetric. The palate elevates in the midline. Hearing intact. Voice is normal. Shoulder shrug is normal. The tongue has normal motion without fasciculations.   Coordination:  No dysmetria  Motor Observation:    No asymmetry, no atrophy, and no involuntary movements noted. Tone:    Normal muscle tone.     Strength:    Strength is V/V in the upper and lower limbs.      Sensation: intact to LT     Assessment/Plan:  Draughon is a 67 y.o. female here as requested by Elfredia Nevins, MD for syncope and muscle spasms today cervicalgia likely muscle spasms.   Past medical history neuropathy, hyperlipidemia, hypertension, diabetes, depression, bipolar 1 disorder, anxiety, abnormality of gait, muscle weakness, lithium toxicity, suicidal ideation."I have no hope" she states she she has ongoing depression and anxiety, FHx extensive psychiatric disease, unknown FHx of dementia. I sent her to ortho, she is going to PT, had dry needing. - Extensive psychiatric history, multiple neurologic complaints over  the years we have seen her, workups have been negative, today has neck pain, back of head pain. Multiple brain images have been stable, MRI cervical spine normal 2021. Going to PT for cervical myofascial pain, occipital neuralgia, cervicalgia last 01/08/2022.  Patient is already on a multitude of medications we use for pain in this condition including including muscle relaxers, nortriptyline, Tylenol and analgesics, Lyrica, amitriptyline, nerve blocks, PT, ice, heat, tried dry needling, I am really not sure what else to do for patient will check her mri cervical spine again but this appears to be cervical myofascial pain syndrome.  - will try nerve block - MRI cervical spine - continue PT - continue meds - Pain management for cervicalgia, occipital neuralgia, tight muscle - cervical myofascial pain - she is diabetic I wouldn;t give her a medrol dosepak but will put a little topical steroids in the block  Orders Placed This Encounter  Procedures   MR CERVICAL SPINE WO CONTRAST   Ambulatory referral to Pain Clinic    All procedures a documented blood were medically necessary, reasonable and appropriate based on the patient's history, medical diagnosis and physician opinion. Verbal informed consent was obtained from the patient, patient was informed of potential risk of procedure, including bruising, bleeding, hematoma formation, infection, muscle weakness, muscle pain, numbness, transient hypertension, transient hyperglycemia and transient insomnia among others. All areas injected were topically clean with isopropyl rubbing alcohol. Nonsterile nonlatex gloves were worn during the procedure.  1. Greater occipital nerve block (by time). The greater occipital nerve site was identified at the nuchal line medial to the occipital artery. Medication was injected into the left occipital nerve areas and suboccipital areas. Patient's condition is associated with inflammation of the greater occipital nerve and  associated multiple groups. Injection was deemed medically necessary, reasonable and appropriate. Injection represents a separate and unique surgical service.   PRIOR ASSESSMENT AND PLANS: - Very unlikely seizures. May be slightly orthostatic (see below). Will order EEG and MRI brain to  evaluate for seizure focus, strokes. But I suspect etiology is not primary neurologic. She should be on ASA daily due to prior stroke seen on MRI. Goal LDL < 70. Manage all vascular risk factors with primary care. Discussed stroke prevention. Will repeat MRI brain.   - Her vitals signs went from 166/101 93 laying to 153/91 p 100 sitting to 143/86 p 106 appears orthostatic but she was not dizzy, still possibly this is a trigger if the syncope bc happens when she is always upright. She saw Dr. Anne Fu in Cardiology as recent as 12/2020 and had evaluation.   - She feels her throat closing up, squeezing, hard to swallow pills. She denies coughing, if she takes things with water its better (may be dry mouth due to polypharmacy), she feels the muscles in her neck squeeze but no choking or aspiration. I recommended a swallow evaluation but she declines then she accepts, will order. Also recommend ENT evaluation per primary care as warranted, Dr. Sherwood Gambler.   - Of note I see a diagnosis of "Parkinson's disease" on her medication list, I am not aware that she has been diagnosed with that or that I diagnosed her with it, at one of my appointment she had some extrapyramidal symptoms(Parkinsonian does not mean Parkinson's disease) likely due to her antidopaminergic drugs but she was never diagnosed with Parkinson's disease as far as I know and not by me and I do NOT think she has parkinson's disease. Today her exam and gait is stable, no progression since 2021. NOT parkinson's disease.   - have seen her in the past for short term memory loss, I ordered formal memory testing and does not appear that was completed. I do not suspect a  neurodegenerative disease, her neurologic exam including mental status is stable.   - Se declines PT for imbalance  No orders of the defined types were placed in this encounter.     PRIOR ASSESSMENT AND PLAN FOR SHORT TERM MEMORY LOSS 07/2019: -This is a patient with an extensive history of psychiatric disorders and medications.  I saw her 3 years ago for memory loss and today she is here for the same.  Mini-Mental status was 28 out of 30 several years ago today is 21 out of 30.  However I think it will be very difficult to differentiate between psychiatric disease and/or neurodegenerative brain disorder in this patient.  We will send her to formal memory testing and see if our colleagues can help [place] sort this out. -She has been on multiple dopaminergic blocking agents in the past and I think this may account for some mild parkinsonian traits that she has on examination such as her gait with low clearance, hypomimia, decreased arm swing.  I will get an MRI of the brain and cervical spine to ensure that she does not have NPH or other disorder such as cervical stenosis with myelopathy given her very brisk reflexes and falls.  But I think she might have some parkinsonian traits secondary to dopamine blocking agents in the past/antipsychotic medications which can be the cause of gait disorder and falls. Recommend PT.  Formal memory testing   Cc: Elfredia Nevins, MD,    Naomie Dean, MD  [facility] [address] [place], [place] [address] Phone [phone] Fax [phone]  I spent 70 minutes of face-to-face and non-face-to-face time with patient on the  1. Neck pain   2. Cervical radiculopathy   3. Crepitus of cervical spine   4.  Decreased range of motion of neck   5. Severe pain   6. Imbalance   7. Ataxia   8. Cervicalgia   9. Cervical myofascial pain syndrome    diagnosis.  This included previsit chart review, lab review, study review, order entry,  electronic health record documentation, patient education on the different diagnostic and therapeutic options, counseling and coordination of care, risks and benefits of management, compliance, or risk factor reduction this includes nerve blocks.

## 2022-01-09 NOTE — Progress Notes (Signed)
Nerve block (with ) steroid: Pt signed consent yes   0.5% Bupivocaine  5 mL LOT: [id number] EXP: 01/2024 NDC: 81856-314-97  2% Lidocaine 3 mL LOT: [id number] EXP: 02/2022 NDC: 88502-774-12  MethlPREDNSolone Acetate injectable Suspension,USP : 80mg /ml  INO:[id number] EXP:05/2023 SJG:[address]-6

## 2022-01-10 ENCOUNTER — Telehealth: Payer: Self-pay | Admitting: Neurology

## 2022-01-10 NOTE — Telephone Encounter (Signed)
Appt 1/11 at 9:40am [place] location

## 2022-01-10 NOTE — Telephone Encounter (Signed)
Referral sent to [facility], phone # [phone]. 

## 2022-01-11 DIAGNOSIS — M47812 Spondylosis without myelopathy or radiculopathy, cervical region: Secondary | ICD-10-CM | POA: Diagnosis not present

## 2022-01-11 DIAGNOSIS — M62838 Other muscle spasm: Secondary | ICD-10-CM | POA: Diagnosis not present

## 2022-01-11 DIAGNOSIS — G8929 Other chronic pain: Secondary | ICD-10-CM | POA: Diagnosis not present

## 2022-01-11 DIAGNOSIS — M542 Cervicalgia: Secondary | ICD-10-CM | POA: Diagnosis not present

## 2022-01-11 NOTE — Telephone Encounter (Signed)
Excellent to hear, thanks!

## 2022-01-11 NOTE — Telephone Encounter (Signed)
Pt's husband called wanting to inform provider that the shots are working just fine and they just had their appt today with pain management.

## 2022-01-12 ENCOUNTER — Ambulatory Visit
Admission: RE | Admit: 2022-01-12 | Discharge: 2022-01-12 | Disposition: A | Discharge status: Home / Self Care | Payer: Medicare Other | Source: Ambulatory Visit | Attending: Neurology | Admitting: Neurology

## 2022-01-12 DIAGNOSIS — M5412 Radiculopathy, cervical region: Secondary | ICD-10-CM

## 2022-01-12 DIAGNOSIS — M4802 Spinal stenosis, cervical region: Secondary | ICD-10-CM | POA: Diagnosis not present

## 2022-01-12 DIAGNOSIS — R29898 Other symptoms and signs involving the musculoskeletal system: Secondary | ICD-10-CM

## 2022-01-12 DIAGNOSIS — M542 Cervicalgia: Secondary | ICD-10-CM

## 2022-01-12 DIAGNOSIS — M2480 Other specific joint derangements of unspecified joint, not elsewhere classified: Secondary | ICD-10-CM

## 2022-01-12 DIAGNOSIS — R270 Ataxia, unspecified: Secondary | ICD-10-CM

## 2022-01-12 DIAGNOSIS — R2689 Other abnormalities of gait and mobility: Secondary | ICD-10-CM

## 2022-01-12 DIAGNOSIS — R52 Pain, unspecified: Secondary | ICD-10-CM

## 2022-01-14 ENCOUNTER — Encounter: Payer: Self-pay | Admitting: Neurology

## 2022-01-15 ENCOUNTER — Telehealth: Payer: Self-pay | Admitting: Neurology

## 2022-01-15 DIAGNOSIS — M4802 Spinal stenosis, cervical region: Secondary | ICD-10-CM

## 2022-01-15 NOTE — Telephone Encounter (Signed)
Patient has arthritis in her neck which could entirely be the cause of her significnat pain, looks like she has pinched nerves on the left. I would recommend seeing neurosurgery. Please call and discuss with her thanks     (1. Left-sided uncovertebral and facet hypertrophy at C5-6 with resultant severe left C6 foraminal stenosis. 2. Disc bulge with uncovertebral spurring at C6-7 with resultant moderate left C7 foraminal stenosis.) see phone note

## 2022-01-15 NOTE — Addendum Note (Signed)
Addended by: Gildardo Griffes on: 01/15/2022 05:34 PM   Modules accepted: Orders

## 2022-01-15 NOTE — Telephone Encounter (Signed)
Response received. Will refer to [facility].

## 2022-01-15 NOTE — Telephone Encounter (Signed)
Dr Jaynee Eagles notified patient via mychart. We are waiting to hear back on whether or not patient is ok with neurosurgery referral.

## 2022-01-16 ENCOUNTER — Telehealth: Payer: Self-pay | Admitting: Neurology

## 2022-01-16 ENCOUNTER — Encounter: Payer: Medicare Other | Admitting: Physical Therapy

## 2022-01-16 NOTE — Telephone Encounter (Signed)
Referral sent to [facility], phone # [phone]. 

## 2022-01-17 DIAGNOSIS — F3181 Bipolar II disorder: Secondary | ICD-10-CM | POA: Diagnosis not present

## 2022-01-17 NOTE — Telephone Encounter (Signed)
She is scheduled for 01/18/22 at 11am with Dr. Annette Stable in [place].

## 2022-01-18 ENCOUNTER — Encounter: Payer: Self-pay | Admitting: Neurology

## 2022-01-18 DIAGNOSIS — Z6829 Body mass index (BMI) 29.0-29.9, adult: Secondary | ICD-10-CM | POA: Diagnosis not present

## 2022-01-18 DIAGNOSIS — M47812 Spondylosis without myelopathy or radiculopathy, cervical region: Secondary | ICD-10-CM | POA: Diagnosis not present

## 2022-01-22 ENCOUNTER — Ambulatory Visit (INDEPENDENT_AMBULATORY_CARE_PROVIDER_SITE_OTHER): Payer: Medicare Other | Admitting: Physical Therapy

## 2022-01-22 ENCOUNTER — Ambulatory Visit: Payer: Medicare Other | Admitting: Neurology

## 2022-01-22 DIAGNOSIS — M542 Cervicalgia: Secondary | ICD-10-CM | POA: Diagnosis not present

## 2022-01-22 DIAGNOSIS — M6281 Muscle weakness (generalized): Secondary | ICD-10-CM

## 2022-01-22 DIAGNOSIS — R2689 Other abnormalities of gait and mobility: Secondary | ICD-10-CM | POA: Diagnosis not present

## 2022-01-22 DIAGNOSIS — R293 Abnormal posture: Secondary | ICD-10-CM | POA: Diagnosis not present

## 2022-01-22 NOTE — Therapy (Signed)
OUTPATIENT PHYSICAL THERAPY TREATMENT   Patient Name: [name] MRN: [id number] DOB:04/14/1955, 67 y.o., female Today's Date: 01/22/2022  END OF SESSION:    PT End of Session - 01/22/22 1208     Visit Number 7    Number of Visits 15    Date for PT Re-Evaluation 02/05/22    Authorization Type MCR and tricare    Progress Note Due on Visit 10    PT Start Time 1145    PT Stop Time 1223    PT Time Calculation (min) 38 min    Activity Tolerance Patient tolerated treatment well    Behavior During Therapy WFL for tasks assessed/performed               Past Medical History:  Diagnosis Date   Anxiety    Bipolar 1 disorder (HCC)    Breast density 06/25/2012   Right breast density, will get mammogram and [place]   Depression    Diabetes mellitus without complication (HCC)    type 1   Duodenitis    Fibromyalgia    GERD (gastroesophageal reflux disease)    History of kidney stones    HTN (hypertension)    Hyperlipidemia    IBS (irritable bowel syndrome)    Neuropathy    Sepsis (HCC) 02/27/2019   Sepsis (HCC) 2023   Past Surgical History:  Procedure Laterality Date   ABDOMINAL HYSTERECTOMY     partial   CHOLECYSTECTOMY     COLONOSCOPY     CYSTOSCOPY WITH RETROGRADE PYELOGRAM, URETEROSCOPY AND STENT PLACEMENT Right 03/31/2019   Procedure: CYSTOSCOPY WITH RETROGRADE PYELOGRAM, URETEROSCOPY AND STENT PLACEMENT;  Surgeon: Noel Christmas, MD;  Location: [facility] [place];  Service: Urology;  Laterality: Right;   CYSTOSCOPY WITH STENT PLACEMENT Right 03/02/2019   Procedure: CYSTOSCOPY WITH STENT PLACEMENT;  Surgeon: Noel Christmas, MD;  Location: [facility];  Service: Urology;  Laterality: Right;   HOLMIUM LASER APPLICATION Right 03/31/2019   Procedure: HOLMIUM LASER APPLICATION;  Surgeon: Noel Christmas, MD;  Location: [facility];  Service: Urology;  Laterality: Right;   UPPER GASTROINTESTINAL ENDOSCOPY     Patient Active Problem List   Diagnosis  Date Noted   Syncope and collapse 12/15/2020   Pure hypercholesterolemia 12/15/2020   Diabetic polyneuropathy (HCC) 03/03/2020   GERD without esophagitis 03/03/2020   Anxiety state    Acute pyelonephritis    Peripheral neuropathy 02/07/2017   Chronic constipation 05/13/2016   Suicide ideation 04/15/2012   LADA (latent autoimmune diabetes in adults), managed as type 1 (HCC)    IBS (irritable bowel syndrome)    Essential hypertension    Bipolar 1 disorder (HCC)    Abnormality of gait 11/07/2011    PCP: Elfredia Nevins, MD   REFERRING PROVIDER: Anson Fret, MD  REFERRING DIAG: M79.18 (ICD-10-CM) - Cervical myofascial pain syndrome M54.81 (ICD-10-CM) - Occipital neuritis  THERAPY DIAG:  Cervicalgia  Abnormal posture  Muscle weakness (generalized)  Other abnormalities of gait and mobility  Rationale for Evaluation and Treatment: Rehabilitation  ONSET DATE: 2 week onset of pain  SUBJECTIVE:  SUBJECTIVE STATEMENT: Pt indicated she has arthritis and pinched nerve in her neck and was referred to neurosurgeon who is going to try injections on friday  PERTINENT HISTORY:  past medical history of neuropathy, hyperlipidemia, hypertension, diabetes, depression, bipolar 1, anxiety, abnormality of gait, muscle weakness, lithium toxicity, suicidal ideation.  Polypharmacy was noted.  PAIN:  NPRS scale: 4/10 Pain location: left side of neck   Pain description: pulsing pain, that goes from her head, to her ear and into left upper trap Aggravating factors: turning her head, it gets bad if she cries Relieving factors: ice, heat  PRECAUTIONS: Fall  WEIGHT BEARING RESTRICTIONS: No  FALLS:  Has patient fallen in last 6 months? Yes. Number of falls 3  OCCUPATION: none  PLOF:  Independent with household mobility with device  PATIENT GOALS: reduce pain  NEXT MD VISIT:   OBJECTIVE:   DIAGNOSTIC FINDINGS:  IMPRESSION: This MRI of the brain with and without contrast shows the following: 1.   No acute findings. 2.   Chronic left cerebellar lacunar infarction, also seen on the 2021 MRI. 3.   Scattered T2/FLAIR hyperintense foci in the subcortical and deep white matter consistent with mild chronic microvascular ischemic change.  None of the foci appear to be acute and there does not appear to be any change compared to the 2021 MRI. 4.   Normal enhancement pattern.  PATIENT SURVEYS:  Eval: FOTO 35% functional, goal is 54%  COGNITION: Overall cognitive status: Within functional limits for tasks assessed  SENSATION: WFL  POSTURE: rounded shoulders and forward head  PALPATION: Tender to palpation with trigger points in left cervical P.S, levator, upper trap    CERVICAL ROM:   Active ROM AROM (deg) eval AROM 12/18/21 AROM 01/02/2022  Flexion 30  WFL  Extension 20  WFL  Right lateral flexion 10 20   Left lateral flexion 10 15   Right rotation 47 60   Left rotation 50 55    (Blank rows = not tested)  UPPER EXTREMITY ROM:  Active ROM Right eval Left eval  Shoulder flexion 150 150  Shoulder extension    Shoulder abduction    Shoulder adduction    Shoulder extension    Shoulder internal rotation    Shoulder external rotation    Elbow flexion    Elbow extension    Wrist flexion    Wrist extension    Wrist ulnar deviation    Wrist radial deviation    Wrist pronation    Wrist supination     (Blank rows = not tested)  UPPER EXTREMITY MMT:  MMT Right eval Left eval Left/Right 01/22/22   Shoulder flexion 4 4 4+/4+  Shoulder extension     Shoulder abduction 4 4 4+/4+  Shoulder adduction     Shoulder extension     Shoulder internal rotation 5 5 5/5  Shoulder external rotation 4+ 4+ 5/5  Middle trapezius     Lower trapezius     Elbow  flexion 5 5 5/5  Elbow extension 4 4 5/5  Wrist flexion     Wrist extension     Wrist ulnar deviation     Wrist radial deviation     Wrist pronation     Wrist supination     Grip strength      (Blank rows = not tested)  CERVICAL SPECIAL TESTS:  Spurling's test: Negative  FUNCTIONAL TESTS:    TODAY'S TREATMENT:  01/22/22 Manual therapy for soft tissue mobilization to her cervical paraspinals  and upper traps, IASTM with cupping to these areas as well.   Nu step X 10 min L6 UE/LE Standing rows red X 20 bilat Standing chest press red X 20 bilat Chin tucks X 10 Neck AROM rotation X 10 bilat Neck sidebend AROM X 10 bilat     PATIENT EDUCATION: Education details: HEP printout from [place] educated: Patient Education method: Consulting civil engineer, Media planner, Verbal cues, and Handouts Education comprehension: verbalized understanding and needs further education   HOME EXERCISE PROGRAM: Access Code: [email or web address] URL: [email or web address] Date: 12/11/2021 Prepared by: Elsie Ra  Exercises - Semi-Tandem Balance at Counter Top Eyes Closed  - 2 x daily - 6 x weekly - 1 sets - 3 reps - 20 sec hold - Feet Together Balance at [employer] Eyes Closed  - 2 x daily - 6 x weekly - 1 sets - 5 reps - 10 sec hold - Heel Toe Raises with Counter Support  - 2 x daily - 6 x weekly - 2 sets - 10 reps - Side Stepping with Unilateral Counter Support  - 2 x daily - 6 x weekly - 1-2 sets - 10 reps - Seated Cervical Retraction  - 2-3 x daily - 7 x weekly - 1 sets - 10 reps - Seated Scapular Retraction  - 2-3 x daily - 7 x weekly - 1 sets - 10 reps - 3-5 hold - Seated Assisted Cervical Rotation with Towel  - 2-3 x daily - 7 x weekly - 1 sets - 10 reps ASSESSMENT:  CLINICAL IMPRESSION:  She had less overall pain today and improved activity tolerance. Her arm strength showed improvements with updated measurements. She would like to continue with PT as she feels it is really  helping   OBJECTIVE IMPAIRMENTS: decreased activity tolerance, decreased shoulder mobility, decreased ROM, decreased strength, impaired flexibility, impaired UE use, postural dysfunction, and pain.  ACTIVITY LIMITATIONS: reaching, lifting, carry,  cleaning, driving, and or occupation  PERSONAL FACTORS: past medical history of neuropathy, hyperlipidemia, hypertension, diabetes, depression, bipolar 1, anxiety, abnormality of gait, muscle weakness, lithium toxicity, suicidal ideation.  Polypharmacy was noted. also affecting patient's functional outcome.  REHAB POTENTIAL: Good  CLINICAL DECISION MAKING: moderate  EVALUATION COMPLEXITY: moderate    GOALS: Short term PT Goals Target date: 12/11/2021 Pt will be I and compliant with HEP. Baseline:  Goal status: on going - 11/20/2021 Pt will decrease pain by 25% overall Baseline: Goal status: on going - 11/20/2021  Long term PT goals Target date: 02/05/2022 Pt will improve neck AROM to [facility] to improve functional reaching Baseline: Goal status: ongoing Pt will improve FOTO to at least 54% functional to show improved function Baseline: Goal status: ongoing Pt will reduce pain to overall less than 3/10 with usual activity and turning her head. Baseline: Goal status: ongoing  PLAN: PT FREQUENCY: 1-2 times per week   PT DURATION: 12 weeks  PLANNED INTERVENTIONS (unless contraindicated): aquatic PT, Canalith repositioning, cryotherapy, Electrical stimulation, Iontophoresis with 4 mg/ml dexamethasome, Moist heat, traction, Ultrasound, gait training, Therapeutic exercise, balance training, neuromuscular re-education, patient/family education, prosthetic training, manual techniques, passive ROM, dry needling, taping, vasopnuematic device, vestibular, spinal manipulations, joint manipulations  PLAN FOR NEXT SESSION: Continue encouragement for HEP, how was cupping STM and repeat if desired. Gait/balance interventions. She now has home TENS so  defer TENS treatment in clinic.    Elsie Ra, PT, DPT 01/22/22 12:09 PM

## 2022-01-26 DIAGNOSIS — M5412 Radiculopathy, cervical region: Secondary | ICD-10-CM | POA: Diagnosis not present

## 2022-01-29 DIAGNOSIS — Z961 Presence of intraocular lens: Secondary | ICD-10-CM | POA: Diagnosis not present

## 2022-01-29 DIAGNOSIS — H04123 Dry eye syndrome of bilateral lacrimal glands: Secondary | ICD-10-CM | POA: Diagnosis not present

## 2022-01-30 ENCOUNTER — Ambulatory Visit (INDEPENDENT_AMBULATORY_CARE_PROVIDER_SITE_OTHER): Payer: Medicare Other | Admitting: Physical Therapy

## 2022-01-30 DIAGNOSIS — R2689 Other abnormalities of gait and mobility: Secondary | ICD-10-CM

## 2022-01-30 DIAGNOSIS — R293 Abnormal posture: Secondary | ICD-10-CM

## 2022-01-30 DIAGNOSIS — M542 Cervicalgia: Secondary | ICD-10-CM | POA: Diagnosis not present

## 2022-01-30 DIAGNOSIS — M6281 Muscle weakness (generalized): Secondary | ICD-10-CM

## 2022-01-30 NOTE — Therapy (Signed)
OUTPATIENT PHYSICAL THERAPY TREATMENT   Patient Name: [name] MRN: [id number] DOB:20-Jun-1955, 67 y.o., female Today's Date: 01/30/2022  END OF SESSION:    PT End of Session - 01/30/22 1203     Visit Number 8    Number of Visits 15    Date for PT Re-Evaluation 02/05/22    Authorization Type MCR and tricare    Progress Note Due on Visit 10    PT Start Time 6644    PT Stop Time 1223    PT Time Calculation (min) 38 min    Activity Tolerance Patient tolerated treatment well    Behavior During Therapy WFL for tasks assessed/performed                Past Medical History:  Diagnosis Date   Anxiety    Bipolar 1 disorder ([place])    Breast density 06/25/2012   Right breast density, will get mammogram and [place]   Depression    Diabetes mellitus without complication (HCC)    type 1   Duodenitis    Fibromyalgia    GERD (gastroesophageal reflux disease)    History of kidney stones    HTN (hypertension)    Hyperlipidemia    IBS (irritable bowel syndrome)    Neuropathy    Sepsis ([place]) 02/27/2019   Sepsis ([place]) 2023   Past Surgical History:  Procedure Laterality Date   ABDOMINAL HYSTERECTOMY     partial   CHOLECYSTECTOMY     COLONOSCOPY     CYSTOSCOPY WITH RETROGRADE PYELOGRAM, URETEROSCOPY AND STENT PLACEMENT Right 03/31/2019   Procedure: CYSTOSCOPY WITH RETROGRADE PYELOGRAM, URETEROSCOPY AND STENT PLACEMENT;  Surgeon: Robley Fries, MD;  Location: [place];  Service: Urology;  Laterality: Right;   CYSTOSCOPY WITH STENT PLACEMENT Right 03/02/2019   Procedure: CYSTOSCOPY WITH STENT PLACEMENT;  Surgeon: Robley Fries, MD;  Location: [place];  Service: Urology;  Laterality: Right;   HOLMIUM LASER APPLICATION Right 0/34/7425   Procedure: HOLMIUM LASER APPLICATION;  Surgeon: Robley Fries, MD;  Location: [facility];  Service: Urology;  Laterality: Right;   UPPER GASTROINTESTINAL ENDOSCOPY     Patient Active Problem List    Diagnosis Date Noted   Syncope and collapse 12/15/2020   Pure hypercholesterolemia 12/15/2020   Diabetic polyneuropathy ([place]) 03/03/2020   GERD without esophagitis 03/03/2020   Anxiety state    Acute pyelonephritis    Peripheral neuropathy 02/07/2017   Chronic constipation 05/13/2016   Suicide ideation 04/15/2012   LADA (latent autoimmune diabetes in adults), managed as type 1 ([place])    IBS (irritable bowel syndrome)    Essential hypertension    Bipolar 1 disorder (HCC)    Abnormality of gait 11/07/2011    PCP: Redmond School, MD   REFERRING PROVIDER: Melvenia Beam, MD  REFERRING DIAG: M79.18 (ICD-10-CM) - Cervical myofascial pain syndrome 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 She has more overall pain in the base of her neck today.   PERTINENT HISTORY:  past medical history of neuropathy, hyperlipidemia, hypertension, diabetes, depression, bipolar 1, anxiety, abnormality of gait, muscle weakness, lithium toxicity, suicidal ideation.  Polypharmacy was noted.  PAIN:  NPRS scale: 8/10 Pain location: base of her neck Pain description: pulsing pain, that goes from her head, to her ear and into left upper trap Aggravating factors: turning her head, it gets bad if she cries Relieving factors: ice, heat  PRECAUTIONS: Fall  WEIGHT BEARING RESTRICTIONS: No  FALLS:  Has patient fallen in last 6 months? Yes. Number of falls  3  OCCUPATION: none  PLOF: Independent with household mobility with device  PATIENT GOALS: reduce pain  NEXT MD VISIT:   OBJECTIVE:   DIAGNOSTIC FINDINGS:  IMPRESSION: This MRI of the brain with and without contrast shows the following: 1.   No acute findings. 2.   Chronic left cerebellar lacunar infarction, also seen on the 2021 MRI. 3.   Scattered T2/FLAIR hyperintense foci in the subcortical and deep white matter consistent with mild chronic microvascular ischemic change.  None of the foci appear to be acute and there does not appear to be any change compared to the 2021 MRI. 4.   Normal enhancement pattern.  PATIENT SURVEYS:  Eval: FOTO 35% functional, goal is 54%  COGNITION: Overall cognitive status: Within functional limits for tasks assessed  SENSATION: WFL  POSTURE: rounded shoulders and forward head  PALPATION: Tender to palpation with trigger points in left cervical P.S, levator, upper trap    CERVICAL ROM:   Active ROM AROM (deg) eval AROM 12/18/21 AROM 01/02/2022  Flexion 30  WFL  Extension 20  WFL  Right lateral flexion 10 20   Left lateral flexion 10 15   Right rotation 47 60   Left rotation 50 55    (Blank rows = not tested)  UPPER EXTREMITY ROM:  Active ROM Right eval Left eval  Shoulder flexion 150 150  Shoulder extension    Shoulder abduction    Shoulder adduction    Shoulder extension    Shoulder internal rotation    Shoulder external rotation    Elbow flexion    Elbow extension    Wrist flexion    Wrist extension    Wrist ulnar deviation    Wrist radial deviation    Wrist pronation    Wrist supination     (Blank rows = not tested)  UPPER EXTREMITY MMT:  MMT Right eval Left eval Left/Right 01/22/22   Shoulder flexion 4 4 4+/4+  Shoulder extension     Shoulder abduction 4 4 4+/4+  Shoulder adduction     Shoulder extension     Shoulder internal rotation 5 5 5/5  Shoulder external rotation 4+ 4+ 5/5  Middle trapezius      Lower trapezius     Elbow flexion 5 5 5/5  Elbow extension 4 4 5/5  Wrist flexion     Wrist extension     Wrist ulnar deviation     Wrist radial deviation     Wrist pronation     Wrist supination     Grip strength      (Blank rows = not tested)  CERVICAL SPECIAL TESTS:  Spurling's test: Negative  FUNCTIONAL TESTS:    TODAY'S TREATMENT:  01/30/22 Manual therapy for soft  tissue mobilization to her cervical paraspinals and upper traps, IASTM with cupping to these areas as well.   Self care: education provided for theracane for self trigger point release techniques with her theracane she has purchased that PT recommended.  Nu step X 10 min L6 UE/LE Standing rows green X 20 bilat Standing chest press green X 20 bilat Chin tucks X 10 Neck AROM rotation X 10 bilat Neck sidebend AROM X 10 bilat  01/22/22 Manual therapy for soft tissue mobilization to her cervical paraspinals and upper traps, IASTM with cupping to these areas as well.   Nu step X 10 min L6 UE/LE Standing rows red X 20 bilat Standing chest press red X 20 bilat Chin tucks X 10 Neck AROM rotation X 10 bilat Neck sidebend AROM X 10 bilat     PATIENT EDUCATION: Education details: HEP printout from [employer] Person educated: Patient Education method: Programmer, multimedia, Facilities manager, Verbal cues, and Handouts Education comprehension: verbalized understanding and needs further education   HOME EXERCISE PROGRAM: Access Code: [email or web address] URL: [email or web address] Date: 12/11/2021 Prepared by: Ivery Quale  Exercises - Semi-Tandem Balance at Counter Top Eyes Closed  - 2 x daily - 6 x weekly - 1 sets - 3 reps - 20 sec hold - Feet Together Balance at [employer] Eyes Closed  - 2 x daily - 6 x weekly - 1 sets - 5 reps - 10 sec hold - Heel Toe Raises with Counter Support  - 2 x daily - 6 x weekly - 2 sets - 10 reps - Side Stepping with Unilateral Counter Support  - 2 x daily - 6 x weekly - 1-2 sets - 10  reps - Seated Cervical Retraction  - 2-3 x daily - 7 x weekly - 1 sets - 10 reps - Seated Scapular Retraction  - 2-3 x daily - 7 x weekly - 1 sets - 10 reps - 3-5 hold - Seated Assisted Cervical Rotation with Towel  - 2-3 x daily - 7 x weekly - 1 sets - 10 reps ASSESSMENT:  CLINICAL IMPRESSION:  She had more overall pain today but continued to have improved activity tolerance for her strengthening and stretching program focused on her neck and shoulder. She did get theracane that PT recommended so we showed her ways she can use this at home for self trigger point release.   OBJECTIVE IMPAIRMENTS: decreased activity tolerance, decreased shoulder mobility, decreased ROM, decreased strength, impaired flexibility, impaired UE use, postural dysfunction, and pain.  ACTIVITY LIMITATIONS: reaching, lifting, carry,  cleaning, driving, and or occupation  PERSONAL FACTORS: past medical history of neuropathy, hyperlipidemia, hypertension, diabetes, depression, bipolar 1, anxiety, abnormality of gait, muscle weakness, lithium toxicity, suicidal ideation.  Polypharmacy was noted. also affecting patient's functional outcome.  REHAB POTENTIAL: Good  CLINICAL DECISION MAKING: moderate  EVALUATION COMPLEXITY: moderate    GOALS: Short term PT Goals Target date: 12/11/2021 Pt will be I and compliant with HEP. Baseline:  Goal status:: MET 01/30/22 Pt will decrease pain by 25% overall Baseline: Goal status: on going - 01/30/2022  Long term PT goals Target date: 02/05/2022 Pt will improve neck AROM to [facility] to improve functional reaching Baseline: Goal status: ongoing Pt will improve FOTO to at least 54% functional to show improved function Baseline: Goal status: ongoing Pt will reduce pain to overall less than 3/10 with usual activity and turning her head. Baseline: Goal status: ongoing  PLAN: PT FREQUENCY: 1-2 times per week   PT DURATION: 12 weeks  PLANNED INTERVENTIONS (  unless  contraindicated): aquatic PT, Canalith repositioning, cryotherapy, Electrical stimulation, Iontophoresis with 4 mg/ml dexamethasome, Moist heat, traction, Ultrasound, gait training, Therapeutic exercise, balance training, neuromuscular re-education, patient/family education, prosthetic training, manual techniques, passive ROM, dry needling, taping, vasopnuematic device, vestibular, spinal manipulations, joint manipulations  PLAN FOR NEXT SESSION: Continue encouragement for HEP, how was cupping STM and repeat if desired. Gait/balance interventions as neck/shoulder pain resolve . She now has home TENS so defer TENS treatment in clinic.    Elsie Ra, PT, DPT 01/30/22 12:17 PM

## 2022-02-05 ENCOUNTER — Encounter: Payer: Medicare Other | Admitting: Physical Therapy

## 2022-02-06 DIAGNOSIS — M5412 Radiculopathy, cervical region: Secondary | ICD-10-CM | POA: Diagnosis not present

## 2022-02-07 DIAGNOSIS — F3181 Bipolar II disorder: Secondary | ICD-10-CM | POA: Diagnosis not present

## 2022-02-12 ENCOUNTER — Encounter: Payer: Self-pay | Admitting: Internal Medicine

## 2022-02-15 DIAGNOSIS — F0390 Unspecified dementia without behavioral disturbance: Secondary | ICD-10-CM | POA: Diagnosis not present

## 2022-02-15 DIAGNOSIS — E663 Overweight: Secondary | ICD-10-CM | POA: Diagnosis not present

## 2022-02-15 DIAGNOSIS — I700 Atherosclerosis of aorta: Secondary | ICD-10-CM | POA: Diagnosis not present

## 2022-02-15 DIAGNOSIS — Z6828 Body mass index (BMI) 28.0-28.9, adult: Secondary | ICD-10-CM | POA: Diagnosis not present

## 2022-02-15 DIAGNOSIS — I10 Essential (primary) hypertension: Secondary | ICD-10-CM | POA: Diagnosis not present

## 2022-02-15 DIAGNOSIS — E1165 Type 2 diabetes mellitus with hyperglycemia: Secondary | ICD-10-CM | POA: Diagnosis not present

## 2022-02-15 DIAGNOSIS — F3110 Bipolar disorder, current episode manic without psychotic features, unspecified: Secondary | ICD-10-CM | POA: Diagnosis not present

## 2022-02-15 DIAGNOSIS — E1140 Type 2 diabetes mellitus with diabetic neuropathy, unspecified: Secondary | ICD-10-CM | POA: Diagnosis not present

## 2022-02-15 DIAGNOSIS — F3170 Bipolar disorder, currently in remission, most recent episode unspecified: Secondary | ICD-10-CM | POA: Diagnosis not present

## 2022-02-16 DIAGNOSIS — R3915 Urgency of urination: Secondary | ICD-10-CM | POA: Diagnosis not present

## 2022-02-16 DIAGNOSIS — R350 Frequency of micturition: Secondary | ICD-10-CM | POA: Diagnosis not present

## 2022-02-19 ENCOUNTER — Encounter: Payer: Self-pay | Admitting: Rehabilitative and Restorative Service Providers"

## 2022-02-19 ENCOUNTER — Ambulatory Visit (INDEPENDENT_AMBULATORY_CARE_PROVIDER_SITE_OTHER): Payer: Medicare Other | Admitting: Rehabilitative and Restorative Service Providers"

## 2022-02-19 DIAGNOSIS — R293 Abnormal posture: Secondary | ICD-10-CM

## 2022-02-19 DIAGNOSIS — M6281 Muscle weakness (generalized): Secondary | ICD-10-CM

## 2022-02-19 DIAGNOSIS — R2689 Other abnormalities of gait and mobility: Secondary | ICD-10-CM | POA: Diagnosis not present

## 2022-02-19 DIAGNOSIS — M542 Cervicalgia: Secondary | ICD-10-CM | POA: Diagnosis not present

## 2022-02-19 NOTE — Therapy (Addendum)
OUTPATIENT PHYSICAL THERAPY TREATMENT /PROGRESS NOTE/ [place]   Patient Name: [name] MRN: [id number] DOB:12/31/1955, 67 y.o., female Today's Date: 02/19/2022  Progress Note Reporting Period 11/14/2022 to 02/19/2022  See note below for Objective Data and Assessment of Progress/Goals.      END OF SESSION:    PT End of Session - 02/19/22 0937     Visit Number 9    Number of Visits 20    Date for PT Re-Evaluation 04/02/22    Authorization Type MCR and tricare    Progress Note Due on Visit 71    PT Start Time 0930    PT Stop Time 1008    PT Time Calculation (min) 38 min    Activity Tolerance Patient tolerated treatment well    Behavior During Therapy WFL for tasks assessed/performed                 Past Medical History:  Diagnosis Date   Anxiety    Bipolar 1 disorder ([place])    Breast density 06/25/2012   Right breast density, will get mammogram and [place]   Depression    Diabetes mellitus without complication (HCC)    type 1   Duodenitis    Fibromyalgia    GERD (gastroesophageal reflux disease)    History of kidney stones    HTN (hypertension)    Hyperlipidemia    IBS (irritable bowel syndrome)    Neuropathy    Sepsis ([place]) 02/27/2019   Sepsis ([place]) 2023   Past Surgical History:  Procedure Laterality Date   ABDOMINAL HYSTERECTOMY     partial   CHOLECYSTECTOMY     COLONOSCOPY     CYSTOSCOPY WITH RETROGRADE PYELOGRAM, URETEROSCOPY AND STENT PLACEMENT Right 03/31/2019   Procedure: CYSTOSCOPY WITH RETROGRADE PYELOGRAM, URETEROSCOPY AND STENT PLACEMENT;  Surgeon: Robley Fries, MD;  Location: [place];  Service: Urology;  Laterality: Right;   CYSTOSCOPY WITH STENT PLACEMENT Right 03/02/2019   Procedure: CYSTOSCOPY WITH STENT PLACEMENT;  Surgeon: Robley Fries, MD;  Location: [place];  Service: Urology;  Laterality: Right;   HOLMIUM LASER APPLICATION Right [id number]   Procedure: HOLMIUM LASER APPLICATION;  Surgeon: Robley Fries,  MD;  Location: [facility];  Service: Urology;  Laterality: Right;   UPPER GASTROINTESTINAL ENDOSCOPY     Patient Active Problem List   Diagnosis Date Noted   Syncope and collapse 12/15/2020   Pure hypercholesterolemia 12/15/2020   Diabetic polyneuropathy ([place]) 03/03/2020   GERD without esophagitis 03/03/2020   Anxiety state    Acute pyelonephritis    Peripheral neuropathy 02/07/2017   Chronic constipation 05/13/2016   Suicide ideation 04/15/2012   LADA (latent autoimmune diabetes in adults), managed as type 1 ([place])    IBS (irritable bowel syndrome)    Essential hypertension    Bipolar 1 disorder (HCC)    Abnormality of gait 11/07/2011    PCP: Redmond School, MD   REFERRING PROVIDER: Melvenia Beam, MD  REFERRING DIAG: M79.18 (ICD-10-CM) - Cervical 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  Not taking anything for it today, noticing improvements.  Wanted to keep working on strength and balance.   PERTINENT HISTORY:  past medical history of neuropathy, hyperlipidemia, hypertension, diabetes, depression, bipolar 1, anxiety, abnormality of gait, muscle weakness, lithium toxicity, suicidal ideation.  Polypharmacy was noted.  PAIN:  NPRS scale: 3/10 today, at worst in last week or two 5/10 Pain location: base of her neck Pain description: pulsing pain, that goes from her head, to her ear and into left upper trap Aggravating factors: turning her  head, it gets bad if she cries Relieving factors: ice, heat  PRECAUTIONS: Fall  WEIGHT BEARING RESTRICTIONS: No  FALLS:  Has patient fallen in last 6 months? Yes. Number of falls 3  OCCUPATION: none  PLOF: Independent with household mobility with device  PATIENT GOALS: reduce pain  NEXT MD VISIT:   OBJECTIVE:   DIAGNOSTIC FINDINGS:  IMPRESSION: This MRI of the brain with and without contrast shows the following: 1.   No acute findings. 2.   Chronic left cerebellar lacunar infarction, also seen on the 2021 MRI. 3.   Scattered T2/FLAIR hyperintense foci in the subcortical and deep white matter consistent with mild chronic microvascular ischemic change.  None of the foci appear to be acute and there does not appear to be any change compared to the 2021 MRI. 4.   Normal enhancement pattern.  PATIENT SURVEYS:  02/19/2022:  FOTO update:  53  Eval: FOTO 35% functional, goal is 54%  COGNITION: Overall cognitive status: Within functional limits for tasks assessed  SENSATION: WFL  POSTURE: rounded shoulders and forward head  PALPATION: Tender to palpation with trigger points in left cervical P.S, levator, upper trap    CERVICAL ROM:   Active ROM AROM (deg) eval AROM 12/18/21 AROM 01/02/2022 AROM 02/19/2022  Flexion 30  WFL 55  Extension 20  WFL 55  Right lateral flexion 10 20    Left lateral flexion 10 15    Right rotation 47 60  70  Left rotation 50 55  52   (Blank rows = not tested)  UPPER EXTREMITY ROM:  Active ROM Right eval Left eval  Shoulder flexion 150 150  Shoulder extension    Shoulder abduction    Shoulder adduction    Shoulder extension    Shoulder internal rotation    Shoulder external rotation    Elbow flexion    Elbow extension    Wrist flexion    Wrist extension    Wrist ulnar deviation    Wrist radial deviation    Wrist pronation    Wrist supination     (Blank rows = not tested)  UPPER EXTREMITY MMT:  MMT Right eval Left eval  Left/Right 01/22/22  Right 02/19/2022 Left 02/19/2022  Shoulder flexion 4 4 4+/4+ 5/5 5/5  Shoulder extension       Shoulder abduction 4 4 4+/4+ 5/5 5/5  Shoulder adduction       Shoulder extension       Shoulder internal rotation 5 5 5/5 5/5 5/5  Shoulder external rotation 4+ 4+ 5/5 5/5 5/5  Middle trapezius       Lower trapezius       Elbow flexion 5 5 5/5    Elbow extension 4 4 5/5    Wrist flexion       Wrist extension       Wrist ulnar deviation       Wrist radial deviation  Wrist pronation       Wrist supination       Grip strength        (Blank rows = not tested)  CERVICAL SPECIAL TESTS:  Spurling's test: Negative  FUNCTIONAL TESTS:   02/19/2022: TUG independent: 10.39 seconds, 10.54 seconds 5 x sit to stand no UE assist 18 inch chair:  17.35 seconds    TODAY'S TREATMENT:  02/19/22 Therex Nu step X 10 min L6 UE/LE Standing green band rows 2 x 10 c feet together Standing green band GH ext 2 x 10 c feet together Standing green band chest press 2 x 10 c feet together  Neuro Re-ed Tandem stance 1 min x 2 bilateral in // bars with occasional hand assist on bar Alternating heel/toe lifts without hands on bar x 20 with slow control focus  Physical Performance Testing Time spent in education and performance of TUG x 2, 5 x sit to stand with scoring noted above  01/30/22 Manual therapy for soft tissue mobilization to her cervical paraspinals and upper traps, IASTM with cupping to these areas as well.   Self care: education provided for theracane for self trigger point release techniques with her theracane she has purchased that PT recommended.  Nu step X 10 min L6 UE/LE Standing rows green X 20 bilat Standing chest press green X 20 bilat Chin tucks X 10 Neck AROM rotation X 10 bilat Neck sidebend AROM X 10 bilat  01/22/22 Manual therapy for soft tissue mobilization to her cervical paraspinals and upper traps, IASTM with cupping to these areas as well.    Nu step X 10 min L6 UE/LE Standing rows red X 20 bilat Standing chest press red X 20 bilat Chin tucks X 10 Neck AROM rotation X 10 bilat Neck sidebend AROM X 10 bilat     PATIENT EDUCATION: Education details: HEP printout from [employer] educated: Patient Education method: Consulting civil engineer, Media planner, Verbal cues, and Handouts Education comprehension: verbalized understanding and needs further education   HOME EXERCISE PROGRAM: Access Code: [email or web address] URL: [email or web address] Date: 12/11/2021 Prepared by: Elsie Ra  Exercises - Semi-Tandem Balance at Counter Top Eyes Closed  - 2 x daily - 6 x weekly - 1 sets - 3 reps - 20 sec hold - Feet Together Balance at [employer] Eyes Closed  - 2 x daily - 6 x weekly - 1 sets - 5 reps - 10 sec hold - Heel Toe Raises with Counter Support  - 2 x daily - 6 x weekly - 2 sets - 10 reps - Side Stepping with Unilateral Counter Support  - 2 x daily - 6 x weekly - 1-2 sets - 10 reps - Seated Cervical Retraction  - 2-3 x daily - 7 x weekly - 1 sets - 10 reps - Seated Scapular Retraction  - 2-3 x daily - 7 x weekly - 1 sets - 10 reps - 3-5 hold - Seated Assisted Cervical Rotation with Towel  - 2-3 x daily - 7 x weekly - 1 sets - 10 reps ASSESSMENT:  CLINICAL IMPRESSION:  Pt has attended 9 visits overall since evaluation with progress noted in cervical symptoms and overall presentation as noted.  See objective data for updated information.  Pt may continue to benefit from skilled PT services to continue to address cervical complaints and functional mobility control to reach established goals.   OBJECTIVE IMPAIRMENTS: decreased activity tolerance, decreased shoulder mobility, decreased ROM, decreased strength, impaired flexibility, impaired UE use, postural dysfunction, and pain.  ACTIVITY LIMITATIONS: reaching, lifting, carry,  cleaning, driving, and or occupation  PERSONAL FACTORS: past medical history of neuropathy,  hyperlipidemia, hypertension, diabetes, depression, bipolar 1, anxiety, abnormality of gait, muscle weakness, lithium toxicity, suicidal ideation.  Polypharmacy was noted. also affecting patient's functional outcome.  REHAB POTENTIAL: Good  CLINICAL DECISION MAKING: moderate  EVALUATION COMPLEXITY: moderate    GOALS: Short term PT Goals Target date: 12/11/2021 Pt will be I and compliant with HEP. Baseline:  Goal status:: MET 01/30/22 Pt will decrease pain by 25% overall Baseline: Goal status: on going - 01/30/2022  Long term PT goals Target date: 04/02/2022   Pt will improve neck AROM to [facility] to improve functional reaching Baseline: Goal status: revised 02/19/2022 Pt will improve FOTO to at least 54% functional to show improved function Baseline: Goal status: revised 02/19/2022 Pt will reduce pain to overall less than 3/10 with usual activity and turning her head. Baseline: Goal status: revised 02/19/2022  4. Pt. Will demonstrate 5 x sit to stand time improved by 5 seconds  New 02/19/2022  PLAN: PT FREQUENCY: 1-2 times per week   PT DURATION: 6 weeks  PLANNED INTERVENTIONS (unless contraindicated): aquatic PT, Canalith repositioning, cryotherapy, Electrical stimulation, Iontophoresis with 4 mg/ml dexamethasome, Moist heat, traction, Ultrasound, gait training, Therapeutic exercise, balance training, neuromuscular re-education, patient/family education, prosthetic training, manual techniques, passive ROM, dry needling, taping, vasopnuematic device, vestibular, spinal manipulations, joint manipulations  PLAN FOR NEXT SESSION: Manual as necessary for neck symptoms.  Progressive compliant surface balance and UE strengthening as tolerated.   Scot Jun, PT, DPT, OCS, ATC 02/19/22  10:07 AM  PHYSICAL THERAPY DISCHARGE SUMMARY  Visits from Start of Care: 9  Current functional level related to goals / functional outcomes: See above   Remaining deficits: See above    Education / Equipment: HEP  Plan:  Patient goals were some met. Patient is being discharged as I spoke with her husband on the phone and he states she feels ready to discharge and will continue with her HEP.  Elsie Ra, PT, DPT 03/20/22 1:34 PM

## 2022-02-26 ENCOUNTER — Other Ambulatory Visit: Payer: Self-pay | Admitting: Neurology

## 2022-02-26 DIAGNOSIS — M792 Neuralgia and neuritis, unspecified: Secondary | ICD-10-CM

## 2022-02-27 ENCOUNTER — Encounter: Payer: Medicare Other | Admitting: Physical Therapy

## 2022-02-28 ENCOUNTER — Other Ambulatory Visit: Payer: Self-pay | Admitting: Internal Medicine

## 2022-02-28 DIAGNOSIS — Z1231 Encounter for screening mammogram for malignant neoplasm of breast: Secondary | ICD-10-CM

## 2022-03-01 NOTE — Telephone Encounter (Signed)
Last seen 01/09/22  Next visit TBD

## 2022-03-02 DIAGNOSIS — F3181 Bipolar II disorder: Secondary | ICD-10-CM | POA: Diagnosis not present

## 2022-03-09 ENCOUNTER — Inpatient Hospital Stay: Admission: RE | Admit: 2022-03-09 | Payer: Medicare Other | Source: Ambulatory Visit

## 2022-03-20 DIAGNOSIS — Z6830 Body mass index (BMI) 30.0-30.9, adult: Secondary | ICD-10-CM | POA: Diagnosis not present

## 2022-03-20 DIAGNOSIS — M5412 Radiculopathy, cervical region: Secondary | ICD-10-CM | POA: Diagnosis not present

## 2022-03-28 ENCOUNTER — Encounter: Payer: Self-pay | Admitting: Internal Medicine

## 2022-03-28 DIAGNOSIS — R8271 Bacteriuria: Secondary | ICD-10-CM | POA: Diagnosis not present

## 2022-03-28 DIAGNOSIS — N3020 Other chronic cystitis without hematuria: Secondary | ICD-10-CM | POA: Diagnosis not present

## 2022-03-28 DIAGNOSIS — F3181 Bipolar II disorder: Secondary | ICD-10-CM | POA: Diagnosis not present

## 2022-04-17 DIAGNOSIS — F3181 Bipolar II disorder: Secondary | ICD-10-CM | POA: Diagnosis not present

## 2022-04-23 DIAGNOSIS — E139 Other specified diabetes mellitus without complications: Secondary | ICD-10-CM

## 2022-04-25 DIAGNOSIS — Z1331 Encounter for screening for depression: Secondary | ICD-10-CM | POA: Diagnosis not present

## 2022-04-25 DIAGNOSIS — F3170 Bipolar disorder, currently in remission, most recent episode unspecified: Secondary | ICD-10-CM | POA: Diagnosis not present

## 2022-04-25 DIAGNOSIS — Z0001 Encounter for general adult medical examination with abnormal findings: Secondary | ICD-10-CM | POA: Diagnosis not present

## 2022-04-25 DIAGNOSIS — E139 Other specified diabetes mellitus without complications: Secondary | ICD-10-CM | POA: Diagnosis not present

## 2022-04-25 DIAGNOSIS — G9332 Myalgic encephalomyelitis/chronic fatigue syndrome: Secondary | ICD-10-CM | POA: Diagnosis not present

## 2022-04-25 DIAGNOSIS — E1140 Type 2 diabetes mellitus with diabetic neuropathy, unspecified: Secondary | ICD-10-CM | POA: Diagnosis not present

## 2022-04-25 DIAGNOSIS — R2681 Unsteadiness on feet: Secondary | ICD-10-CM | POA: Diagnosis not present

## 2022-04-25 DIAGNOSIS — E663 Overweight: Secondary | ICD-10-CM | POA: Diagnosis not present

## 2022-04-25 DIAGNOSIS — G629 Polyneuropathy, unspecified: Secondary | ICD-10-CM | POA: Diagnosis not present

## 2022-04-25 DIAGNOSIS — Z6829 Body mass index (BMI) 29.0-29.9, adult: Secondary | ICD-10-CM | POA: Diagnosis not present

## 2022-04-25 DIAGNOSIS — F3110 Bipolar disorder, current episode manic without psychotic features, unspecified: Secondary | ICD-10-CM | POA: Diagnosis not present

## 2022-04-25 DIAGNOSIS — D518 Other vitamin B12 deficiency anemias: Secondary | ICD-10-CM | POA: Diagnosis not present

## 2022-04-25 DIAGNOSIS — E1165 Type 2 diabetes mellitus with hyperglycemia: Secondary | ICD-10-CM | POA: Diagnosis not present

## 2022-04-25 DIAGNOSIS — E559 Vitamin D deficiency, unspecified: Secondary | ICD-10-CM | POA: Diagnosis not present

## 2022-04-26 MED ORDER — NOVOLOG FLEXPEN 100 UNIT/ML ~~LOC~~ SOPN: PEN_INJECTOR | SUBCUTANEOUS | 3 refills | Status: DC

## 2022-04-26 MED ORDER — LANTUS SOLOSTAR 100 UNIT/ML ~~LOC~~ SOPN: PEN_INJECTOR | SUBCUTANEOUS | 4 refills | Status: DC

## 2022-04-26 NOTE — Telephone Encounter (Signed)
Pt contacted and confirmed she is taking 48 units of Lantus daily and 13-16 units of Novolog TID with a sliding scale. New rx sent to pharmacy.

## 2022-05-02 ENCOUNTER — Telehealth: Payer: Self-pay

## 2022-05-02 NOTE — Telephone Encounter (Signed)
Pt lvm requesting a new rx be sent [facility] for insulin. Needed new dosage reflected.

## 2022-05-02 NOTE — Telephone Encounter (Signed)
Lvm for pt advising a new rx was sent 04/23/22 for both Novolog and Lantus increased dosages.

## 2022-05-07 ENCOUNTER — Telehealth: Payer: Self-pay | Admitting: Internal Medicine

## 2022-05-07 ENCOUNTER — Encounter: Payer: Self-pay | Admitting: Internal Medicine

## 2022-05-07 ENCOUNTER — Ambulatory Visit (INDEPENDENT_AMBULATORY_CARE_PROVIDER_SITE_OTHER): Payer: Medicare Other | Admitting: Internal Medicine

## 2022-05-07 VITALS — BP 124/80 | HR 113 | Ht 64.0 in | Wt 179.8 lb

## 2022-05-07 DIAGNOSIS — Z794 Long term (current) use of insulin: Secondary | ICD-10-CM

## 2022-05-07 DIAGNOSIS — E785 Hyperlipidemia, unspecified: Secondary | ICD-10-CM | POA: Diagnosis not present

## 2022-05-07 DIAGNOSIS — Z7984 Long term (current) use of oral hypoglycemic drugs: Secondary | ICD-10-CM

## 2022-05-07 DIAGNOSIS — E119 Type 2 diabetes mellitus without complications: Secondary | ICD-10-CM

## 2022-05-07 DIAGNOSIS — E1342 Other specified diabetes mellitus with diabetic polyneuropathy: Secondary | ICD-10-CM | POA: Diagnosis not present

## 2022-05-07 DIAGNOSIS — E139 Other specified diabetes mellitus without complications: Secondary | ICD-10-CM

## 2022-05-07 DIAGNOSIS — G63 Polyneuropathy in diseases classified elsewhere: Secondary | ICD-10-CM

## 2022-05-07 LAB — POCT GLYCOSYLATED HEMOGLOBIN (HGB A1C): Hemoglobin A1C: 7.2 % — AB (ref 4.0–5.6)

## 2022-05-07 MED ORDER — DEXCOM G6 SENSOR MISC: 3 refills | Status: DC

## 2022-05-07 MED ORDER — DEXCOM G6 TRANSMITTER MISC: 3 refills | Status: DC

## 2022-05-07 NOTE — Telephone Encounter (Signed)
Rx sent 

## 2022-05-07 NOTE — Addendum Note (Signed)
Addended by: Kenyon Ana on: 05/07/2022 05:01 PM   Modules accepted: Orders

## 2022-05-07 NOTE — Progress Notes (Signed)
Patient ID: [name], female   DOB: 1955/07/21, 67 y.o.   MRN: [id number]  HPI: [name] is a 66 y.o.-year-old female, initially referred by her PCP, Dr. Assunta Found (PA: Lenise Herald), returning for f/u for LADA, dx 2005, insulin-dependent since ~2006, controlled, with complications (PN, gastroparesis?).  Last visit 4 months ago.    Interim history: She has bipolar disease and also has short-term memory loss, tension headaches, disequilibrium for which she sees neurology.  She also has anxiety and sees a Veterinary surgeon.  She continues to have nausea.  She also has increased urination. She mentions that she had many dietary indiscretions after dinner, snacking on sweets until few days ago, when she realized that these are causing her to gain weight and her sugars to be high.  She just stopped these.  Reviewed history: She was on an Omnipod insulin pump on 08/01/2015. She had severe anxiety and depression and got so overwhelmed with the insulin pump that we had to take her off the pump.  We switched her to a basal-bolus insulin regimen, but she was unable to calculate the insulin doses based on insulin to carb ratio, so now she is using fixed rapid acting insulin doses.  Reviewed HbA1c levels: 08/25/2021: HbA1c calculated from fructosamine is 8.6%, higher than before. 04/25/2021: HbA1c calculated from fructosamine is approximately 7.3%, higher than before.  03/2021: Reportedly HbA1c 4.1% in Dr. Sharyon Medicus office. Lab Results  Component Value Date   HGBA1C 5.0 03/03/2020   HGBA1C 5.8 (A) 11/09/2019   HGBA1C 5.7 (A) 06/30/2019  01/24/2021: HbA1c calculated from fructosamine is 6.9%. 10/17/2020: HbA1c calculated from fructosamine is 7.25%. 07/11/2020: HbA1c calculated from fructosamine is 7.3%, slightly higher than before. 04/21/2020: HbA1c calculated from fructosamine has improved to 7.1%. 03/04/2018:  Hba1c calculated from fructosamine is the best in a long time, at 6.84%! 12/19/2017: HbA1c  calculated from fructosamine has improved to 7.1%! 09/12/2017: HbA1c  Calculated from the fructosamine is 7.5%, improved from before. 05/07/2017: HbA1c calculated from the fructosamine was 7.7% 02/07/2017: HbA1c calculated from the fructosamine was higher, at 7.7%. 11/07/2016: HbA1c calc. from the fructosamine is a little lower, at 7.25% 08/07/2016: HbA1c calculated from the fructosamine is 7.5% 05/31/2016: HbA1c calculated from fructosamine is better, at 7.25% 12/15/2015: HbA1c calculated from the fructosamine is higher, 8.4%. 08/24/2015:  HbA1c calculated from fructosamine is 7.4%. 05/24/2015: HbA1c calculated from fructosamine is 6.9%.  She is on: - Metformin 1000 mg with dinner >> 500 mg 2x a day - Lantus 20 units in am and 25 >> 28 units at night - NovoLog:  13-15 units before the 3 meals - Novolog Sliding scale: 201-225: + 2 units 225-250: + 3 units 251-275: + 4 units >275: + 5 units If your sugars are <100 and you eat a low carb meal, inject only ~6 units. So, If sugars are <100 before the meals, you still need to take insulin for that meal. Do not correct sugars <300 at bedtime. If sugars are <70 before a meal, bring the sugar up first, then inject insulin for the meals.  Pt checks her sugars >4x a day with her Dexcom CGM:  Previously:  Previously:   Lowest sugar was 32 >>... 40 >> 53 >> 53 >> 100; she has hypoglycemia awareness in the 50s Highest sugar was 351 >> 383 >> 358 >> 300s.  No CKD, last BUN/creatinine:  Lab Results  Component Value Date   BUN 19 04/25/2021   CREATININE 0.60 04/25/2021   No microalbuminuria: Lab  Results  Component Value Date   MICRALBCREAT 5.4 11/07/2016    + Dyslipidemia: Lab Results  Component Value Date   CHOL 178 10/17/2020   HDL 45.70 10/17/2020   LDLCALC 100 (H) 10/17/2020   TRIG 165.0 (H) 10/17/2020   CHOLHDL 4 10/17/2020  She was started on Crestor 10 mg daily - but taken off, on Ezetimibe.  -Last eye exam was 2024: No  DR reportedly, but dry eyes >> Dr Dione Booze. Had cataract sx's.  -+ Numbness and tingling in her feet.  Also, occasional pain.  She was on Neurontin, but now only on nortriptyline.  These are refilled by PCP.  She is also on alpha-lipoic acid. She sees a podiatrist [facility]).  She had 2 toenails removed in the past due to fungal infection.  Last foot exam 08/25/2021.  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  She has a diagnosis of Parkinson's disease.  She also has bipolar disease. Also: GERD, HTN, fibromyalgia.  Latest TSH was normal: Lab Results  Component Value Date   TSH 0.533 03/03/2020   ROS: + See HPI Neurological: no tremors/+ numbness/+ tingling/no dizziness, + disequilibrium  I reviewed pt's medications, allergies, PMH, social hx, family hx, and changes were documented in the history of present illness. Otherwise, unchanged from my initial visit note.  Past Medical History:  Diagnosis Date   Anxiety    Bipolar 1 disorder (HCC)    Breast density 06/25/2012   Right breast density, will get mammogram and [place]   Depression    Diabetes mellitus without complication (HCC)    type 1   Duodenitis    Fibromyalgia    GERD (gastroesophageal reflux disease)    History of kidney stones    HTN (hypertension)    Hyperlipidemia    IBS (irritable bowel syndrome)    Neuropathy    Sepsis (HCC) 02/27/2019   Sepsis (HCC) 2023   Past Surgical History:  Procedure Laterality Date   ABDOMINAL HYSTERECTOMY     partial   CHOLECYSTECTOMY     COLONOSCOPY     CYSTOSCOPY WITH RETROGRADE PYELOGRAM, URETEROSCOPY AND STENT PLACEMENT Right 03/31/2019   Procedure: CYSTOSCOPY WITH RETROGRADE PYELOGRAM, URETEROSCOPY AND STENT PLACEMENT;  Surgeon: Noel Christmas, MD;  Location: [facility] ;  Service: Urology;  Laterality: Right;   CYSTOSCOPY WITH STENT PLACEMENT Right 03/02/2019   Procedure: CYSTOSCOPY WITH STENT PLACEMENT;  Surgeon: Noel Christmas, MD;  Location:  [facility] OR;  Service: Urology;  Laterality: Right;   HOLMIUM LASER APPLICATION Right 03/31/2019   Procedure: HOLMIUM LASER APPLICATION;  Surgeon: Noel Christmas, MD;  Location: [facility];  Service: Urology;  Laterality: Right;   UPPER GASTROINTESTINAL ENDOSCOPY     Social History   Socioeconomic History   Marital status: Married    Spouse name: phillip   Number of children: 1   Years of education: 12   Highest education level: High school graduate  Occupational History   Occupation: Disabled  Tobacco Use   Smoking status: Never   Smokeless tobacco: Never  Vaping Use   Vaping Use: Some days   Substances: Flavoring  Substance and Sexual Activity   Alcohol use: No    Alcohol/week: 0.0 standard drinks of alcohol   Drug use: No   Sexual activity: Not Currently  Other Topics Concern   Not on file  Social History Narrative   Lives at home w/ her husband   Left-handed   Caffeine: 1-2 cups of  coffee daily   Social Determinants of Health   Financial Resource Strain: Low Risk  (12/07/2016)   Overall Financial Resource Strain (CARDIA)    Difficulty of Paying Living Expenses: Not hard at all  Food Insecurity: No Food Insecurity (12/07/2016)   Hunger Vital Sign    Worried About Running Out of Food in the Last Year: Never true    Ran Out of Food in the Last Year: Never true  Transportation Needs: Unmet Transportation Needs (12/07/2016)   PRAPARE - Administrator, Civil Service (Medical): Yes    Lack of Transportation (Non-Medical): Yes  Physical Activity: Inactive (12/07/2016)   Exercise Vital Sign    Days of Exercise per Week: 0 days    Minutes of Exercise per Session: 0 min  Stress: Not on file  Social Connections: Somewhat Isolated (12/07/2016)   Social Connection and Isolation Panel [NHANES]    Frequency of Communication with Friends and Family: Never    Frequency of Social Gatherings with Friends and Family: Never    Attends Religious Services: More  than 4 times per year    Active Member of [employer] or Organizations: No    Attends Banker Meetings: Never    Marital Status: Married  Catering manager Violence: Not At Risk (12/07/2016)   Humiliation, Afraid, Rape, and Kick questionnaire    Fear of Current or Ex-Partner: No    Emotionally Abused: No    Physically Abused: No    Sexually Abused: No   Current Outpatient Medications on File Prior to Visit  Medication Sig Dispense Refill   acetaminophen (TYLENOL) 500 MG tablet Take 1,000 mg by mouth every 6 (six) hours as needed for mild pain or headache.     ALPRAZolam (XANAX) 1 MG tablet Take 1 mg by mouth 4 (four) times daily as needed for anxiety.      AMBULATORY NON FORMULARY MEDICATION Squatty Potty x 1 1 each 0   amitriptyline (ELAVIL) 25 MG tablet Take 25 mg by mouth at bedtime.     amLODipine (NORVASC) 5 MG tablet Take 5 mg by mouth daily.     aspirin EC 81 MG tablet Take 1 tablet (81 mg total) by mouth daily. Swallow whole. 30 tablet 11   BD PEN NEEDLE NANO U/F 32G X 4 MM MISC USE WITH INSULIN PEN FOUR TIMES A DAY 400 each 3   BENICAR 40 MG tablet Take 40 mg by mouth daily.     busPIRone (BUSPAR) 10 MG tablet Take 10 mg by mouth in the morning and at bedtime.     Butalbital-APAP-Caffeine 50-325-40 MG capsule Take 1 capsule by mouth 4 (four) times daily as needed.     cholecalciferol (VITAMIN D3) 25 MCG (1000 UNIT) tablet Take 1,000 Units by mouth daily.     Continuous Blood Gluc Receiver (DEXCOM G6 RECEIVER) DEVI Use as instructed to check blood sugar. 1 each 0   Continuous Blood Gluc Sensor (DEXCOM G6 SENSOR) MISC Use as instructed to check blood sugar change every 10 days 9 each 3   Continuous Blood Gluc Transmit (DEXCOM G6 TRANSMITTER) MISC Use as instructed to check blood sugar. Change every 90 days 1 each 3   Docusate Sodium (COLACE PO) Take 100 mg by mouth daily.     esomeprazole (NEXIUM) 40 MG capsule Take 40 mg by mouth 2 (two) times daily.     ezetimibe (ZETIA) 10  MG tablet Take 10 mg by mouth daily.     Glucagon (BAQSIMI ONE  PACK) 3 MG/DOSE POWD Use 1 spray in the nose as needed for hypoglycemia 1 each PRN   glucose blood (FREESTYLE LITE) test strip USE TO CHECK BLOOD SUGAR 6 TIMES PER DAY 600 each 3   insulin aspart (NOVOLOG FLEXPEN) 100 UNIT/ML FlexPen INJECT 12 TO 16 UNITS UNDER THE SKIN AT THE START OF THE THREE MAIN MEALS AS DIRECTED 45 mL 3   insulin glargine (LANTUS SOLOSTAR) 100 UNIT/ML Solostar Pen INJECT 48 UNITS UNDER THE SKIN DAILY AS ADVISED 45 mL 4   Insulin Syringes, Disposable, U-100 1 ML MISC To use for insulin injection.. 100 each 1   Lancets (FREESTYLE) lancets USE TO TEST BLOOD SUGAR 4 TO 6 TIMES DAILY AS INSTRUCTED 300 each 6   losartan (COZAAR) 25 MG tablet Take 25 mg by mouth daily.     metFORMIN (GLUCOPHAGE) 500 MG tablet TAKE 1 TABLET TWICE A DAY WITH MEALS 180 tablet 3   methocarbamol (ROBAXIN) 500 MG tablet Take 500 mg by mouth 4 (four) times daily as needed.     nortriptyline (PAMELOR) 50 MG capsule Take 50 mg by mouth at bedtime.      ondansetron (ZOFRAN-ODT) 4 MG disintegrating tablet PLACE 1 TO 2 TABLETS ON TONGUE EVERY 4 TO 6 HOURS AS NEEDED FOR NAUSEA (Patient taking differently: Take 4-8 mg by mouth every 4 (four) hours as needed for nausea or vomiting.) 180 tablet 4   OSCIMIN 0.125 MG SUBL DISSOLVE 1 TABLET UNDER THE TONGUE EVERY 4 HOURS AS NEEDED (NEED APPOINTMENT) 360 tablet 0   pregabalin (LYRICA) 50 MG capsule START WITH TWICE DAILY. IF NO SIDE EFFECTS IN 1 WEEK MAY INCREASE TO 3X DAILY.*STOP GABAPENTIN* 90 capsule 2   Probiotic CAPS Take 1 capsule by mouth daily. 30 capsule 1   Propylene Glycol (SYSTANE BALANCE OP) Place 1 drop into both eyes daily as needed (dry eyes).     traZODone (DESYREL) 100 MG tablet 1  qhs  May increse to 2  qhs  After 1 week (Patient taking differently: Take 100 mg by mouth at bedtime. 1  qhs  May increse to 2  qhs  After 1 week) 180 tablet 1   venlafaxine (EFFEXOR) 37.5 MG tablet Take 37.5 mg  by mouth daily.     vitamin B-12 (CYANOCOBALAMIN) 1000 MCG tablet Take 1,000 mcg by mouth daily.     Current Facility-Administered Medications on File Prior to Visit  Medication Dose Route Frequency Provider Last Rate Last Admin   bupivacaine (MARCAINE) 0.5 % (with pres) injection 1 mL  1 mL Infiltration Once Anson Fret, MD       lidocaine (XYLOCAINE) 2 % (with pres) injection 20 mg  1 mL Intradermal Once Anson Fret, MD       methylPREDNISolone acetate (DEPO-MEDROL) injection 80 mg  80 mg Intramuscular Once Anson Fret, MD       Allergies  Allergen Reactions   Abilify [Aripiprazole]     Patient is intolerant   Phenergan [Promethazine] Diarrhea and Nausea And Vomiting   Reglan [Metoclopramide] Other (See Comments)    Chest pains   Family History  Problem Relation Age of Onset   Hypertension Mother    Bipolar disorder Mother    Cancer Mother    Depression Father    Cancer Father    Thyroid disease Sister    Hypertension Sister    Bipolar disorder Sister    Heart disease Brother    Bipolar disorder Maternal Aunt    Bipolar disorder  Daughter    Colon cancer Neg Hx    Colon polyps Neg Hx    Esophageal cancer Neg Hx    Kidney disease Neg Hx    Gallbladder disease Neg Hx    Diabetes Neg Hx    Dementia Neg Hx    Tremor Neg Hx    Seizures Neg Hx    Migraines Neg Hx    Headache Neg Hx     PE: BP 124/80 (BP Location: Left Arm, Patient Position: Sitting, Cuff Size: Normal)   Pulse (!) 113   Ht 5\' 4"  (1.626 m)   Wt 179 lb 12.8 oz (81.6 kg)   SpO2 97%   BMI 30.86 kg/m    Wt Readings from Last 3 Encounters:  05/07/22 179 lb 12.8 oz (81.6 kg)  01/09/22 172 lb (78 kg)  12/29/21 171 lb 3.2 oz (77.7 kg)   Constitutional: slightly overweight, in NAD Eyes: EOMI, no exophthalmos ENT: no thyromegaly, no cervical lymphadenopathy Cardiovascular: Tachycardia, RR, No MRG Respiratory: CTA B Musculoskeletal: no deformities Skin: + many tatoos Neurological: no  tremor with outstretched hands  ASSESSMENT: 1. 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  Component     Latest Ref Rng 07/16/2013  C-Peptide     0.80 - 3.90 ng/mL 1.29  Glucose     70 - 99 mg/dL 413 (H)  Glutamic Acid Decarb Ab     <=1.0 U/mL 11.3 (H)  Pancreatic Islet Cell Antibody     <5 JDF Units 5 (A)  + anti-pancreatic antibodies >> LADA rather than type 2 DM. She still has a positive C peptide >> still has insulin secretion.  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 HL  PLAN:  1.  Patient with poorly controlled LADA, difficult to manage due to depression/anxiety/GI symptoms.  Sugars usually fluctuate between 30s and 300s.  She was previously on insulin pump but she could not continue due to anxiety.  She is back on a CGM now. -At last visit, sugars were quite high throughout the day but she was not allowing the sugars to drop closer to 80s due to anxiety that they may drop too low.  Her husband also describes that she did not like her blood sugars to be lower than 250 when going to bed for fear of hypoglycemia.  We discussed that this is not good control and I strongly advised her to allow the sugars to drop lower than that.  We also discussed about how to cover the meals with NovoLog-to decide the dose based on the size and consistency of her meals.  At last visit, HbA1c predicted from the GMI was 8.9%. CGM interpretation: -At today's visit, we reviewed her CGM downloads: It appears that 30% of values are in target range (goal >70%), while 70% are higher than 180 (goal <25%), and 0% are lower than 70 (goal <4%).  The calculated average blood sugar is 217.  The projected HbA1c for the next 3 months (GMI) is 8.5%. -Reviewing the CGM trends, sugars are fluctuating around the upper limit of the target range during the day but increasing significantly after dinner, and  staying elevated overnight.  Upon questioning, patient was having many dietary indiscretions after dinner, snacking on sweets and then going to bed late.  She realized that this is raising her blood sugars and causing her to gain weight (she gained 7 pounds since last visit) so she  recently stopped snacking at night.  I expect this to improve her blood sugars overnight so for now I did not recommend to change her regimen. - I suggested to:  Patient Instructions  Please continue: - Metformin 500 mg 2x a day with meals - Lantus 20 units in am and 28 units at night - NovoLog:  13-15 units before the 3 meals - Novolog Sliding scale: 201-225: + 2 units 225-250: + 3 units 251-275: + 4 units >275: + 5 units If your sugars are <100 and you eat a low carb meal, inject only ~6 units. So, If sugars are <100 before the meals, you still need to take insulin for that meal. Do not correct sugars <300 at bedtime. If sugars are <70 before a meal, bring the sugar up first, then inject insulin for the meals. Allow the sugars to drop to 80 before you start correcting them.  Stop eating after dinner.  Please return in 4 months.  - we checked her HbA1c: 7.2% (higher) -we will also check a fructosamine level - advised to check sugars at different times of the day - 4x a day, rotating check times - advised for yearly eye exams >> she is UTD - return to clinic in 4 months  2. PN  -Related to diabetes -She continues nortriptyline and Lyrica; she was on gabapentin in the past but this was tapered off - she is also on alpha lipoic acid 600 mg twice a day; also on Nervine Roll On -She had physical therapy for this in the past  3. HL -Reviewed latest lipid panel available from review-from 10/2020: LDL above target, triglycerides slightly high: Lab Results  Component Value Date   CHOL 178 10/17/2020   HDL 45.70 10/17/2020   LDLCALC 100 (H) 10/17/2020   TRIG 165.0 (H) 10/17/2020   CHOLHDL 4 10/17/2020   -She was started on Crestor 10 mg daily  - now off (she does not remember why), but currently on Zetia 10 mg daily-tolerated well. -She did have another lipid panel before last visit but I do not have these records -she will call PCP to ask him to send them to me  Carlus Pavlov, MD PhD [facility]

## 2022-05-07 NOTE — Telephone Encounter (Signed)
MEDICATION: Dexcom G6 Sensors and Dexcom G6 Transmitter  PHARMACY:  [facility]  HAS THE PATIENT CONTACTED THEIR PHARMACY?  yes  IS THIS A 90 DAY SUPPLY : yes  IS PATIENT OUT OF MEDICATION: no   IF NOT; HOW MUCH IS LEFT: 2-3 sensors  LAST APPOINTMENT DATE: @5 /06/2022  NEXT APPOINTMENT DATE:@9 /10/2022  DO WE HAVE YOUR PERMISSION TO LEAVE A DETAILED MESSAGE?: yes  OTHER COMMENTS:    **Let patient know to contact pharmacy at the end of the day to make sure medication is ready. **  ** Please notify patient to allow 48-72 hours to process**  **Encourage patient to contact the pharmacy for refills or they can request refills through [facility]**

## 2022-05-07 NOTE — Patient Instructions (Addendum)
Please continue: - Metformin 500 mg 2x a day with meals - Lantus 20 units in am and 28 units at night - NovoLog:  13-15 units before the 3 meals - Novolog Sliding scale: 201-225: + 2 units 225-250: + 3 units 251-275: + 4 units >275: + 5 units If your sugars are <100 and you eat a low carb meal, inject only ~6 units. So, If sugars are <100 before the meals, you still need to take insulin for that meal. Do not correct sugars <300 at bedtime. If sugars are <70 before a meal, bring the sugar up first, then inject insulin for the meals. Allow the sugars to drop to 80 before you start correcting them.  Stop eating after dinner.  Please return in 4 months.

## 2022-05-09 LAB — FRUCTOSAMINE: Fructosamine: 400 umol/L — ABNORMAL HIGH (ref 205–285)

## 2022-05-14 ENCOUNTER — Telehealth: Payer: Self-pay

## 2022-05-14 NOTE — Telephone Encounter (Signed)
Increased stress can definitely affect the blood sugars.  The main thing would be to try to relax as much as possible and bolus consistently before the meals.  If the sugars are consistently high after meals, she may need 2 to 3 units more mealtime insulin prior to the meal.  No need to schedule new appointment for now.

## 2022-05-14 NOTE — Telephone Encounter (Signed)
Pt called to advise her sugars have been very 'up and down'. Pt has had some family health issues and it has effected her sugars and her remembering what to take. Pt is constantly worried and thinks she may need to be seen again to get herself back on track. She thinks she is taking what she is supposed to take when she should take it but she is worried she is having trouble remembering. Dexcom is connected.

## 2022-05-16 NOTE — Telephone Encounter (Signed)
Pt contacted and advised Increased stress can definitely affect the blood sugars.  The main thing would be to try to relax as much as possible and bolus consistently before the meals.  If the sugars are consistently high after meals, she may need 2 to 3 units more mealtime insulin prior to the meal.  No need to schedule new appointment for now.

## 2022-05-18 DIAGNOSIS — M542 Cervicalgia: Secondary | ICD-10-CM | POA: Diagnosis not present

## 2022-05-18 DIAGNOSIS — R2681 Unsteadiness on feet: Secondary | ICD-10-CM | POA: Diagnosis not present

## 2022-05-18 DIAGNOSIS — M6281 Muscle weakness (generalized): Secondary | ICD-10-CM | POA: Diagnosis not present

## 2022-05-18 DIAGNOSIS — R2689 Other abnormalities of gait and mobility: Secondary | ICD-10-CM | POA: Diagnosis not present

## 2022-05-21 ENCOUNTER — Other Ambulatory Visit: Payer: Self-pay

## 2022-05-21 DIAGNOSIS — E139 Other specified diabetes mellitus without complications: Secondary | ICD-10-CM

## 2022-05-21 MED ORDER — FREESTYLE LITE TEST VI STRP: ORAL_STRIP | 3 refills | Status: AC

## 2022-05-21 MED ORDER — BD PEN NEEDLE NANO U/F 32G X 4 MM MISC: 3 refills | Status: AC

## 2022-05-30 DIAGNOSIS — F3181 Bipolar II disorder: Secondary | ICD-10-CM | POA: Diagnosis not present

## 2022-06-01 DIAGNOSIS — N3020 Other chronic cystitis without hematuria: Secondary | ICD-10-CM | POA: Diagnosis not present

## 2022-06-01 DIAGNOSIS — N3946 Mixed incontinence: Secondary | ICD-10-CM | POA: Diagnosis not present

## 2022-06-01 DIAGNOSIS — F3181 Bipolar II disorder: Secondary | ICD-10-CM | POA: Diagnosis not present

## 2022-06-01 DIAGNOSIS — F411 Generalized anxiety disorder: Secondary | ICD-10-CM | POA: Diagnosis not present

## 2022-06-01 DIAGNOSIS — F32A Depression, unspecified: Secondary | ICD-10-CM | POA: Diagnosis not present

## 2022-06-11 DIAGNOSIS — M6281 Muscle weakness (generalized): Secondary | ICD-10-CM | POA: Diagnosis not present

## 2022-06-11 DIAGNOSIS — M542 Cervicalgia: Secondary | ICD-10-CM | POA: Diagnosis not present

## 2022-06-11 DIAGNOSIS — R2689 Other abnormalities of gait and mobility: Secondary | ICD-10-CM | POA: Diagnosis not present

## 2022-06-11 DIAGNOSIS — R2681 Unsteadiness on feet: Secondary | ICD-10-CM | POA: Diagnosis not present

## 2022-06-13 DIAGNOSIS — M5412 Radiculopathy, cervical region: Secondary | ICD-10-CM | POA: Diagnosis not present

## 2022-06-14 DIAGNOSIS — M542 Cervicalgia: Secondary | ICD-10-CM | POA: Diagnosis not present

## 2022-06-14 DIAGNOSIS — M6281 Muscle weakness (generalized): Secondary | ICD-10-CM | POA: Diagnosis not present

## 2022-06-14 DIAGNOSIS — R2681 Unsteadiness on feet: Secondary | ICD-10-CM | POA: Diagnosis not present

## 2022-06-14 DIAGNOSIS — R2689 Other abnormalities of gait and mobility: Secondary | ICD-10-CM | POA: Diagnosis not present

## 2022-06-18 DIAGNOSIS — R2689 Other abnormalities of gait and mobility: Secondary | ICD-10-CM | POA: Diagnosis not present

## 2022-06-18 DIAGNOSIS — M542 Cervicalgia: Secondary | ICD-10-CM | POA: Diagnosis not present

## 2022-06-18 DIAGNOSIS — R2681 Unsteadiness on feet: Secondary | ICD-10-CM | POA: Diagnosis not present

## 2022-06-18 DIAGNOSIS — M6281 Muscle weakness (generalized): Secondary | ICD-10-CM | POA: Diagnosis not present

## 2022-06-25 DIAGNOSIS — R2689 Other abnormalities of gait and mobility: Secondary | ICD-10-CM | POA: Diagnosis not present

## 2022-06-25 DIAGNOSIS — R2681 Unsteadiness on feet: Secondary | ICD-10-CM | POA: Diagnosis not present

## 2022-06-25 DIAGNOSIS — M542 Cervicalgia: Secondary | ICD-10-CM | POA: Diagnosis not present

## 2022-06-25 DIAGNOSIS — M6281 Muscle weakness (generalized): Secondary | ICD-10-CM | POA: Diagnosis not present

## 2022-06-28 DIAGNOSIS — M542 Cervicalgia: Secondary | ICD-10-CM | POA: Diagnosis not present

## 2022-06-28 DIAGNOSIS — R2689 Other abnormalities of gait and mobility: Secondary | ICD-10-CM | POA: Diagnosis not present

## 2022-06-28 DIAGNOSIS — F32A Depression, unspecified: Secondary | ICD-10-CM | POA: Diagnosis not present

## 2022-06-28 DIAGNOSIS — R2681 Unsteadiness on feet: Secondary | ICD-10-CM | POA: Diagnosis not present

## 2022-06-28 DIAGNOSIS — M6281 Muscle weakness (generalized): Secondary | ICD-10-CM | POA: Diagnosis not present

## 2022-07-06 DIAGNOSIS — R2681 Unsteadiness on feet: Secondary | ICD-10-CM | POA: Diagnosis not present

## 2022-07-06 DIAGNOSIS — R2689 Other abnormalities of gait and mobility: Secondary | ICD-10-CM | POA: Diagnosis not present

## 2022-07-06 DIAGNOSIS — M6281 Muscle weakness (generalized): Secondary | ICD-10-CM | POA: Diagnosis not present

## 2022-07-06 DIAGNOSIS — M542 Cervicalgia: Secondary | ICD-10-CM | POA: Diagnosis not present

## 2022-07-12 DIAGNOSIS — M542 Cervicalgia: Secondary | ICD-10-CM | POA: Diagnosis not present

## 2022-07-12 DIAGNOSIS — F32A Depression, unspecified: Secondary | ICD-10-CM | POA: Diagnosis not present

## 2022-07-12 DIAGNOSIS — M6281 Muscle weakness (generalized): Secondary | ICD-10-CM | POA: Diagnosis not present

## 2022-07-12 DIAGNOSIS — F3181 Bipolar II disorder: Secondary | ICD-10-CM | POA: Diagnosis not present

## 2022-07-12 DIAGNOSIS — R2681 Unsteadiness on feet: Secondary | ICD-10-CM | POA: Diagnosis not present

## 2022-07-12 DIAGNOSIS — F411 Generalized anxiety disorder: Secondary | ICD-10-CM | POA: Diagnosis not present

## 2022-07-12 DIAGNOSIS — R2689 Other abnormalities of gait and mobility: Secondary | ICD-10-CM | POA: Diagnosis not present

## 2022-07-19 DIAGNOSIS — R2689 Other abnormalities of gait and mobility: Secondary | ICD-10-CM | POA: Diagnosis not present

## 2022-07-19 DIAGNOSIS — M6281 Muscle weakness (generalized): Secondary | ICD-10-CM | POA: Diagnosis not present

## 2022-07-19 DIAGNOSIS — M542 Cervicalgia: Secondary | ICD-10-CM | POA: Diagnosis not present

## 2022-07-19 DIAGNOSIS — R2681 Unsteadiness on feet: Secondary | ICD-10-CM | POA: Diagnosis not present

## 2022-07-25 DIAGNOSIS — G629 Polyneuropathy, unspecified: Secondary | ICD-10-CM | POA: Diagnosis not present

## 2022-07-25 DIAGNOSIS — R519 Headache, unspecified: Secondary | ICD-10-CM | POA: Diagnosis not present

## 2022-07-25 DIAGNOSIS — E1159 Type 2 diabetes mellitus with other circulatory complications: Secondary | ICD-10-CM | POA: Diagnosis not present

## 2022-07-25 DIAGNOSIS — I10 Essential (primary) hypertension: Secondary | ICD-10-CM | POA: Diagnosis not present

## 2022-07-25 DIAGNOSIS — G4700 Insomnia, unspecified: Secondary | ICD-10-CM | POA: Diagnosis not present

## 2022-07-25 DIAGNOSIS — F3170 Bipolar disorder, currently in remission, most recent episode unspecified: Secondary | ICD-10-CM | POA: Diagnosis not present

## 2022-07-25 DIAGNOSIS — F32A Depression, unspecified: Secondary | ICD-10-CM | POA: Diagnosis not present

## 2022-07-25 DIAGNOSIS — E1140 Type 2 diabetes mellitus with diabetic neuropathy, unspecified: Secondary | ICD-10-CM | POA: Diagnosis not present

## 2022-07-30 ENCOUNTER — Other Ambulatory Visit: Payer: Self-pay | Admitting: Gastroenterology

## 2022-07-30 DIAGNOSIS — D649 Anemia, unspecified: Secondary | ICD-10-CM | POA: Diagnosis not present

## 2022-07-30 DIAGNOSIS — K5909 Other constipation: Secondary | ICD-10-CM | POA: Diagnosis not present

## 2022-07-30 DIAGNOSIS — K589 Irritable bowel syndrome without diarrhea: Secondary | ICD-10-CM | POA: Diagnosis not present

## 2022-07-30 DIAGNOSIS — K219 Gastro-esophageal reflux disease without esophagitis: Secondary | ICD-10-CM | POA: Diagnosis not present

## 2022-07-30 DIAGNOSIS — R110 Nausea: Secondary | ICD-10-CM

## 2022-08-03 ENCOUNTER — Ambulatory Visit
Admission: RE | Admit: 2022-08-03 | Discharge: 2022-08-03 | Disposition: A | Discharge status: Home / Self Care | Payer: Medicare Other | Source: Ambulatory Visit | Attending: Gastroenterology | Admitting: Gastroenterology

## 2022-08-03 DIAGNOSIS — R110 Nausea: Secondary | ICD-10-CM | POA: Diagnosis not present

## 2022-08-16 DIAGNOSIS — M47812 Spondylosis without myelopathy or radiculopathy, cervical region: Secondary | ICD-10-CM | POA: Diagnosis not present

## 2022-08-16 DIAGNOSIS — Z6831 Body mass index (BMI) 31.0-31.9, adult: Secondary | ICD-10-CM | POA: Diagnosis not present

## 2022-08-23 ENCOUNTER — Other Ambulatory Visit (HOSPITAL_COMMUNITY): Payer: Self-pay | Admitting: Gastroenterology

## 2022-08-23 DIAGNOSIS — R110 Nausea: Secondary | ICD-10-CM

## 2022-08-23 DIAGNOSIS — R1110 Vomiting, unspecified: Secondary | ICD-10-CM

## 2022-08-25 DIAGNOSIS — Z23 Encounter for immunization: Secondary | ICD-10-CM | POA: Diagnosis not present

## 2022-08-28 ENCOUNTER — Encounter (HOSPITAL_COMMUNITY): Payer: Medicare Other

## 2022-08-29 ENCOUNTER — Other Ambulatory Visit (HOSPITAL_COMMUNITY): Payer: Medicare Other

## 2022-08-29 ENCOUNTER — Encounter (HOSPITAL_COMMUNITY): Payer: Self-pay

## 2022-09-10 ENCOUNTER — Emergency Department (HOSPITAL_COMMUNITY)
Admission: EM | Admit: 2022-09-10 | Discharge: 2022-09-10 | Disposition: A | Discharge status: Home / Self Care | Payer: Medicare Other | Attending: Emergency Medicine | Admitting: Emergency Medicine

## 2022-09-10 ENCOUNTER — Encounter (HOSPITAL_COMMUNITY): Payer: Self-pay | Admitting: Emergency Medicine

## 2022-09-10 DIAGNOSIS — E1165 Type 2 diabetes mellitus with hyperglycemia: Secondary | ICD-10-CM | POA: Insufficient documentation

## 2022-09-10 DIAGNOSIS — R739 Hyperglycemia, unspecified: Secondary | ICD-10-CM

## 2022-09-10 DIAGNOSIS — I10 Essential (primary) hypertension: Secondary | ICD-10-CM | POA: Diagnosis not present

## 2022-09-10 DIAGNOSIS — Z794 Long term (current) use of insulin: Secondary | ICD-10-CM | POA: Insufficient documentation

## 2022-09-10 DIAGNOSIS — E669 Obesity, unspecified: Secondary | ICD-10-CM | POA: Insufficient documentation

## 2022-09-10 DIAGNOSIS — Z79899 Other long term (current) drug therapy: Secondary | ICD-10-CM | POA: Insufficient documentation

## 2022-09-10 DIAGNOSIS — Z7982 Long term (current) use of aspirin: Secondary | ICD-10-CM | POA: Insufficient documentation

## 2022-09-10 LAB — COMPREHENSIVE METABOLIC PANEL
ALT: 23 U/L (ref 0–44)
AST: 27 U/L (ref 15–41)
Albumin: 3.8 g/dL (ref 3.5–5.0)
Alkaline Phosphatase: 120 U/L (ref 38–126)
Anion gap: 10 (ref 5–15)
BUN: 16 mg/dL (ref 8–23)
CO2: 25 mmol/L (ref 22–32)
Calcium: 8.4 mg/dL — ABNORMAL LOW (ref 8.9–10.3)
Chloride: 100 mmol/L (ref 98–111)
Creatinine, Ser: 0.73 mg/dL (ref 0.44–1.00)
GFR, Estimated: >60 mL/min (ref >60)
Glucose, Bld: 469 mg/dL — ABNORMAL HIGH (ref 70–99)
Potassium: 5.5 mmol/L — ABNORMAL HIGH (ref 3.5–5.1)
Sodium: 135 mmol/L (ref 135–145)
Total Bilirubin: 0.7 mg/dL (ref 0.3–1.2)
Total Protein: 6.6 g/dL (ref 6.5–8.1)

## 2022-09-10 LAB — URINALYSIS, ROUTINE W REFLEX MICROSCOPIC
Bacteria, UA: NONE SEEN
Bilirubin Urine: NEGATIVE
Glucose, UA: >=500 mg/dL — AB
Hgb urine dipstick: NEGATIVE
Ketones, ur: NEGATIVE mg/dL
Leukocytes,Ua: NEGATIVE
Nitrite: NEGATIVE
Protein, ur: NEGATIVE mg/dL
Specific Gravity, Urine: 1.032 — ABNORMAL HIGH (ref 1.005–1.030)
pH: 6 (ref 5.0–8.0)

## 2022-09-10 LAB — CBC WITH DIFFERENTIAL/PLATELET
Abs Immature Granulocytes: 0.01 10*3/uL (ref 0.00–0.07)
Basophils Absolute: 0 10*3/uL (ref 0.0–0.1)
Basophils Relative: 1 %
Eosinophils Absolute: 0.2 10*3/uL (ref 0.0–0.5)
Eosinophils Relative: 4 %
HCT: 38 % (ref 36.0–46.0)
Hemoglobin: 11.7 g/dL — ABNORMAL LOW (ref 12.0–15.0)
Immature Granulocytes: 0 %
Lymphocytes Relative: 33 %
Lymphs Abs: 1.5 10*3/uL (ref 0.7–4.0)
MCH: 26.8 pg (ref 26.0–34.0)
MCHC: 30.8 g/dL (ref 30.0–36.0)
MCV: 87 fL (ref 80.0–100.0)
Monocytes Absolute: 0.6 10*3/uL (ref 0.1–1.0)
Monocytes Relative: 14 %
Neutro Abs: 2.3 10*3/uL (ref 1.7–7.7)
Neutrophils Relative %: 48 %
Platelets: 217 10*3/uL (ref 150–400)
RBC: 4.37 MIL/uL (ref 3.87–5.11)
RDW: 14.6 % (ref 11.5–15.5)
WBC: 4.7 10*3/uL (ref 4.0–10.5)
nRBC: 0 % (ref 0.0–0.2)

## 2022-09-10 LAB — POTASSIUM: Potassium: 4.3 mmol/L (ref 3.5–5.1)

## 2022-09-10 LAB — CBG MONITORING, ED
Glucose-Capillary: 389 mg/dL — ABNORMAL HIGH (ref 70–99)
Glucose-Capillary: 301 mg/dL — ABNORMAL HIGH (ref 70–99)
Glucose-Capillary: 221 mg/dL — ABNORMAL HIGH (ref 70–99)

## 2022-09-10 MED ORDER — SODIUM CHLORIDE 0.9 % IV BOLUS
1000.0000 mL | Freq: Once | INTRAVENOUS | Status: AC
  Administered 2022-09-10: 1000 mL via INTRAVENOUS

## 2022-09-10 MED ORDER — INSULIN ASPART 100 UNIT/ML IJ SOLN
10.0000 [IU] | Freq: Once | INTRAMUSCULAR | Status: AC
  Administered 2022-09-10: 10 [IU] via SUBCUTANEOUS

## 2022-09-10 MED ORDER — ACETAMINOPHEN 500 MG PO TABS
1000.0000 mg | ORAL_TABLET | Freq: Once | ORAL | Status: AC
  Administered 2022-09-10: 1000 mg via ORAL
  Filled 2022-09-10: qty 2

## 2022-09-10 NOTE — Discharge Instructions (Addendum)
Glucose was elevated but was appropriately downtrending after insulin and fluids, you are not in diabetic ketoacidosis.  Recommend you routinely follow-up with your primary care physician for continued management of your diabetes.

## 2022-09-10 NOTE — ED Triage Notes (Signed)
Pt has dexcon and it went off. She took lantus tonight and takes novolog. In triage [id number]. Pt is tearful and states she hasn't slept in 2-3 days. Husband is with her. No signs of distress.

## 2022-09-10 NOTE — ED Provider Notes (Signed)
[name] EMERGENCY DEPARTMENT AT [facility] Provider Note   CSN: [id number] Arrival date & time: 09/10/22  7829     History  Chief Complaint  Patient presents with   Hyperglycemia    [name] is a 67 y.o. female.   Hyperglycemia    67 year old female with medical history significant for depression, IBS, hypertension, bipolar disorder, hyperlipidemia, diabetes mellitus, nephrolithiasis who presents to the emergency department with uncontrolled blood glucose.  The patient states that her blood sugars have been elevated in the 400s at home on her Dexcom confirmed with fingersticks.  She has been compliant with her insulin for the last few days and has been trying to adhere to a diet regimen.  Her husband states that she has been having trouble sleeping at night because her Dexcom has been going off.  No flight of ideas, no rapid pressured speech, no SI, HI or AVH.  She denies any infectious symptoms, no burning while urinating, no abdominal discomfort that is new, she is moving her bowels and passing gas, no chest pain, fevers chills or shortness of breath.  Home Medications Prior to Admission medications   Medication Sig Start Date End Date Taking? Authorizing Provider  acetaminophen (TYLENOL) 500 MG tablet Take 1,000 mg by mouth every 6 (six) hours as needed for mild pain or headache.    [provider]  ALPRAZolam Prudy Feeler) 1 MG tablet Take 1 mg by mouth 4 (four) times daily as needed for anxiety.  08/10/18   [provider]  AMBULATORY NON FORMULARY MEDICATION Squatty Potty x 1 03/07/15   Nandigam, Eleonore Chiquito, MD  amitriptyline (ELAVIL) 25 MG tablet Take 25 mg by mouth at bedtime. 08/16/21   [provider]  amLODipine (NORVASC) 5 MG tablet Take 5 mg by mouth daily. 02/19/19   [provider]  aspirin EC 81 MG tablet Take 1 tablet (81 mg total) by mouth daily. Swallow whole. 02/27/21   Anson Fret, MD  BD PEN NEEDLE NANO U/F 32G X 4 MM  MISC USE WITH INSULIN PEN FOUR TIMES A DAY 05/21/22   Carlus Pavlov, MD  BENICAR 40 MG tablet Take 40 mg by mouth daily. 02/02/15   [provider]  busPIRone (BUSPAR) 10 MG tablet Take 10 mg by mouth in the morning and at bedtime. 02/23/20   [provider]  Butalbital-APAP-Caffeine 50-325-40 MG capsule Take 1 capsule by mouth 4 (four) times daily as needed. 10/30/21   [provider]  cholecalciferol (VITAMIN D3) 25 MCG (1000 UNIT) tablet Take 1,000 Units by mouth daily.    [provider]  Continuous Blood Gluc Receiver (DEXCOM G6 RECEIVER) DEVI Use as instructed to check blood sugar. 05/16/21   Carlus Pavlov, MD  Continuous Glucose Sensor (DEXCOM G6 SENSOR) MISC Use as instructed to check blood sugar change every 10 days 05/07/22   Carlus Pavlov, MD  Continuous Glucose Transmitter (DEXCOM G6 TRANSMITTER) MISC Use as instructed to check blood sugar. Change every 90 days 05/07/22   Carlus Pavlov, MD  Docusate Sodium (COLACE PO) Take 100 mg by mouth daily.    [provider]  esomeprazole (NEXIUM) 40 MG capsule Take 40 mg by mouth 2 (two) times daily. 01/20/20   [provider]  ezetimibe (ZETIA) 10 MG tablet Take 10 mg by mouth daily.    [provider]  Glucagon (BAQSIMI ONE PACK) 3 MG/DOSE POWD Use 1 spray in the nose as needed for hypoglycemia 10/17/20   Carlus Pavlov,  MD  glucose blood (FREESTYLE LITE) test strip USE TO CHECK BLOOD SUGAR 6 TIMES PER DAY 05/21/22   Carlus Pavlov, MD  insulin aspart (NOVOLOG FLEXPEN) 100 UNIT/ML FlexPen INJECT 12 TO 16 UNITS UNDER THE SKIN AT THE START OF THE THREE MAIN MEALS AS DIRECTED Patient taking differently: INJECT 13 TO 15 UNITS UNDER THE SKIN AT THE START OF THE THREE MAIN MEALS AS DIRECTED 04/26/22   Carlus Pavlov, MD  insulin glargine (LANTUS SOLOSTAR) 100 UNIT/ML Solostar Pen INJECT 48 UNITS UNDER THE SKIN DAILY AS ADVISED 04/26/22   Carlus Pavlov, MD  Insulin Syringes,  Disposable, U-100 1 ML MISC To use for insulin injection.. 09/23/15   Carlus Pavlov, MD  Lancets (FREESTYLE) lancets USE TO TEST BLOOD SUGAR 4 TO 6 TIMES DAILY AS INSTRUCTED 09/14/19   Carlus Pavlov, MD  losartan (COZAAR) 25 MG tablet Take 25 mg by mouth daily. 02/23/20   [provider]  metFORMIN (GLUCOPHAGE) 500 MG tablet TAKE 1 TABLET TWICE A DAY WITH MEALS 08/14/21   Carlus Pavlov, MD  methocarbamol (ROBAXIN) 500 MG tablet Take 500 mg by mouth 4 (four) times daily as needed. 10/30/21   [provider]  nortriptyline (PAMELOR) 50 MG capsule Take 50 mg by mouth at bedtime.  02/17/19   [provider]  ondansetron (ZOFRAN-ODT) 4 MG disintegrating tablet PLACE 1 TO 2 TABLETS ON TONGUE EVERY 4 TO 6 HOURS AS NEEDED FOR NAUSEA Patient taking differently: Take 4-8 mg by mouth every 4 (four) hours as needed for nausea or vomiting. 12/15/18   Nandigam, Eleonore Chiquito, MD  OSCIMIN 0.125 MG SUBL DISSOLVE 1 TABLET UNDER THE TONGUE EVERY 4 HOURS AS NEEDED (NEED APPOINTMENT) 09/11/19   Napoleon Form, MD  pregabalin (LYRICA) 50 MG capsule START WITH TWICE DAILY. IF NO SIDE EFFECTS IN 1 WEEK MAY INCREASE TO 3X DAILY.*STOP GABAPENTIN* 03/05/22   Anson Fret, MD  Probiotic CAPS Take 1 capsule by mouth daily. 02/25/18   Ward, Layla Maw, DO  Propylene Glycol (SYSTANE BALANCE OP) Place 1 drop into both eyes daily as needed (dry eyes).    [provider]  traZODone (DESYREL) 100 MG tablet 1  qhs  May increse to 2  qhs  After 1 week Patient taking differently: Take 100 mg by mouth at bedtime. 1  qhs  May increse to 2  qhs  After 1 week 04/10/18   Archer Asa, MD  venlafaxine (EFFEXOR) 37.5 MG tablet Take 37.5 mg by mouth daily. 01/22/20   [provider]  vitamin B-12 (CYANOCOBALAMIN) 1000 MCG tablet Take 1,000 mcg by mouth daily.    [provider]  Vitamin D, Ergocalciferol, (DRISDOL) 1.25 MG (50000 UNIT) CAPS capsule Take 50,000 Units by mouth once a  week. 05/03/22   [provider]      Allergies    Abilify [aripiprazole], Phenergan [promethazine], and Reglan [metoclopramide]    Review of Systems   Review of Systems  All other systems reviewed and are negative.   Physical Exam Updated Vital Signs BP (!) 180/91   Pulse 86   Temp 98.4 F (36.9 C) (Oral)   Resp 14   SpO2 100%  Physical Exam Vitals and nursing note reviewed.  Constitutional:      General: She is not in acute distress.    Appearance: She is well-developed. She is obese.  HENT:     Head: Normocephalic and atraumatic.  Eyes:     Conjunctiva/sclera: Conjunctivae normal.  Cardiovascular:  Rate and Rhythm: Normal rate and regular rhythm.  Pulmonary:     Effort: Pulmonary effort is normal. No respiratory distress.     Breath sounds: Normal breath sounds.  Abdominal:     Palpations: Abdomen is soft.     Tenderness: There is no abdominal tenderness.  Musculoskeletal:        General: No swelling.     Cervical back: Neck supple.  Skin:    General: Skin is warm and dry.     Capillary Refill: Capillary refill takes less than 2 seconds.  Neurological:     Mental Status: She is alert.  Psychiatric:        Attention and Perception: Attention and perception normal.        Mood and Affect: Mood normal.        Speech: Speech normal.        Behavior: Behavior normal. Behavior is cooperative.        Thought Content: Thought content normal.     ED Results / Procedures / Treatments   Labs (all labs ordered are listed, but only abnormal results are displayed) Labs Reviewed  URINALYSIS, ROUTINE W REFLEX MICROSCOPIC - Abnormal; Notable for the following components:      Result Value   Specific Gravity, Urine 1.032 (*)    Glucose, UA >=500 (*)    All other components within normal limits  COMPREHENSIVE METABOLIC PANEL - Abnormal; Notable for the following components:   Potassium 5.5 (*)    Glucose, Bld 469 (*)    Calcium 8.4 (*)    All other  components within normal limits  CBC WITH DIFFERENTIAL/PLATELET - Abnormal; Notable for the following components:   Hemoglobin 11.7 (*)    All other components within normal limits  CBG MONITORING, ED - Abnormal; Notable for the following components:   Glucose-Capillary 389 (*)    All other components within normal limits  CBG MONITORING, ED - Abnormal; Notable for the following components:   Glucose-Capillary 301 (*)    All other components within normal limits  CBG MONITORING, ED - Abnormal; Notable for the following components:   Glucose-Capillary 221 (*)    All other components within normal limits  POTASSIUM    EKG None  Radiology No results found.  Procedures Procedures    Medications Ordered in ED Medications  sodium chloride 0.9 % bolus 1,000 mL (0 mLs Intravenous Stopped 09/10/22 0933)  insulin aspart (novoLOG) injection 10 Units (10 Units Subcutaneous Given 09/10/22 0838)  acetaminophen (TYLENOL) tablet 1,000 mg (1,000 mg Oral Given 09/10/22 1136)    ED Course/ Medical Decision Making/ A&P                                 Medical Decision Making Amount and/or Complexity of Data Reviewed Labs: ordered.  Risk OTC drugs. Prescription drug management.    67 year old female with medical history significant for depression, IBS, hypertension, bipolar disorder, hyperlipidemia, diabetes mellitus, nephrolithiasis who presents to the emergency department with uncontrolled blood glucose.  The patient states that her blood sugars have been elevated in the 400s at home on her Dexcom confirmed with fingersticks.  She has been compliant with her insulin for the last few days and has been trying to adhere to a diet regimen.  Her husband states that she has been having trouble sleeping at night because her Dexcom has been going off.  No flight of ideas, no rapid pressured  speech, no SI, HI or AVH.  She denies any infectious symptoms, no burning while urinating, no abdominal discomfort  that is new, she is moving her bowels and passing gas, no chest pain, fevers chills or shortness of breath.  On arrival, the patient was vitally stable, afebrile, not tachycardic or tachypneic, BP 174/96, O2 sats 96% on room air.  Sinus rhythm noted on cardiac telemetry.  Initial blood glucose was notably elevated 389.  DKA workup initiated.  CMP revealed a normal bicarbonate of 25 and anion gap of 10.  UA without evidence of UTI or hematuria.  IV access was obtained and the patient was administered an IV fluid bolus and 10 units of subcutaneous insulin.  The patient's blood glucose was appropriately downtrending to 221.  Her initial CMP revealed elevated potassium to 5.5 but she did show hemolysis, repeat potassium check was normal at 4.3.  Patient has no evidence of UTI.  Hyperglycemia without evidence of HHS or DKA, overall at this time, the patient is stable for outpatient follow-up with her PCP.   Final Clinical Impression(s) / ED Diagnoses Final diagnoses:  Hyperglycemia    Rx / DC Orders ED Discharge Orders     None         Ernie Avena, MD 09/10/22 1336

## 2022-09-11 ENCOUNTER — Encounter: Payer: Self-pay | Admitting: Internal Medicine

## 2022-09-11 ENCOUNTER — Ambulatory Visit (INDEPENDENT_AMBULATORY_CARE_PROVIDER_SITE_OTHER): Payer: Medicare Other | Admitting: Internal Medicine

## 2022-09-11 VITALS — BP 122/64 | HR 116 | Ht 64.0 in | Wt 179.8 lb

## 2022-09-11 DIAGNOSIS — E139 Other specified diabetes mellitus without complications: Secondary | ICD-10-CM

## 2022-09-11 DIAGNOSIS — E785 Hyperlipidemia, unspecified: Secondary | ICD-10-CM | POA: Diagnosis not present

## 2022-09-11 DIAGNOSIS — G63 Polyneuropathy in diseases classified elsewhere: Secondary | ICD-10-CM

## 2022-09-11 DIAGNOSIS — F32A Depression, unspecified: Secondary | ICD-10-CM | POA: Diagnosis not present

## 2022-09-11 DIAGNOSIS — E1042 Type 1 diabetes mellitus with diabetic polyneuropathy: Secondary | ICD-10-CM

## 2022-09-11 DIAGNOSIS — Z794 Long term (current) use of insulin: Secondary | ICD-10-CM

## 2022-09-11 DIAGNOSIS — Z7984 Long term (current) use of oral hypoglycemic drugs: Secondary | ICD-10-CM

## 2022-09-11 LAB — POCT GLYCOSYLATED HEMOGLOBIN (HGB A1C): Hemoglobin A1C: 8 % — AB (ref 4.0–5.6)

## 2022-09-11 LAB — GLUCOSE, POCT (MANUAL RESULT ENTRY): POC Glucose: 432 mg/dL — AB (ref 70–99)

## 2022-09-11 MED ORDER — TOUJEO MAX SOLOSTAR 300 UNIT/ML ~~LOC~~ SOPN: PEN_INJECTOR | SUBCUTANEOUS | 3 refills | Status: DC

## 2022-09-11 MED ORDER — LYUMJEV KWIKPEN 200 UNIT/ML ~~LOC~~ SOPN: 13.0000 [IU] | PEN_INJECTOR | Freq: Three times a day (TID) | SUBCUTANEOUS | 3 refills | Status: DC

## 2022-09-11 NOTE — Patient Instructions (Addendum)
Please continue: - Metformin 500 mg 2x a day with meals  Change from Lantus to: - Toujeo 44 units in am  - may need to increase dose to 48 or even 50 units if sugars remain high (tonight take 30 units only)  Change from NovoLog to: - Lyumjev:  13-15 units before the 3 meals - Lyumjev Sliding scale: 201-225: + 2 units 225-250: + 3 units 251-275: + 4 units >275: + 5 units If your sugars are <100 and you eat a low carb meal, inject only ~6 units. So, If sugars are <100 before the meals, you still need to take insulin for that meal. Do not correct sugars <300 at bedtime. If sugars are <70 before a meal, bring the sugar up first, then inject insulin for the meals. Allow the sugars to drop to 80 before you start correcting them.  Please return in 2 months.

## 2022-09-11 NOTE — Addendum Note (Signed)
Addended by: Pollie Meyer on: 09/11/2022 01:53 PM   Modules accepted: Orders

## 2022-09-11 NOTE — Progress Notes (Signed)
Patient ID: [name], female   DOB: 1956-01-01, 67 y.o.   MRN: [id number]  HPI: [name] is a 67 y.o.-year-old female, initially referred by her PCP, Dr. Assunta Found (PA: Lenise Herald), returning for f/u for LADA, dx 2005, insulin-dependent since ~2006, controlled, with complications (PN, gastroparesis?).  Last visit 4 months ago.  She is here with her husband who offers part of the history especially regarding her blood sugars, symptoms, and insulin doses.  Interim history: She has bipolar disease and also has short-term memory loss, tension headaches, disequilibrium for which she sees neurology.  She also has anxiety and sees a Veterinary surgeon.  She continues to have nausea and increased urination. Before last visit, she had many dietary indiscretions and some weight gain.  She just started to improve her diet at the time of our last visit.  She continues to work on the diet and reduced snacks at night. She was in the emergency room yesterday after having had blood sugar of 494.  She was given IV insulin and hydrated and discharged.  No signs of DKA. At today's visit, CBG 432.  They mention that she started to have high blood sugars approximately 3 days ago.  She does mention several panic attacks in this period.  Reviewed history: She was on an Omnipod insulin pump on 08/01/2015. She had severe anxiety and depression and got so overwhelmed with the insulin pump that we had to take her off the pump.  We switched her to a basal-bolus insulin regimen, but she was unable to calculate the insulin doses based on insulin to carb ratio, so now she is using fixed rapid acting insulin doses.  Reviewed HbA1c levels: 05/07/2022: HbA1c calculated from fructosamine is 8.4%, slightly lower than before. Lab Results  Component Value Date   HGBA1C 7.2 (A) 05/07/2022   HGBA1C 5.0 03/03/2020   HGBA1C 5.8 (A) 11/09/2019  08/25/2021: HbA1c calculated from fructosamine is 8.6%, higher than before. 04/25/2021:  HbA1c calculated from fructosamine is approximately 7.3%, higher than before.  03/2021: Reportedly HbA1c 4.1% in Dr. Sharyon Medicus office. 01/24/2021: HbA1c calculated from fructosamine is 6.9%. 10/17/2020: HbA1c calculated from fructosamine is 7.25%. 07/11/2020: HbA1c calculated from fructosamine is 7.3%, slightly higher than before. 04/21/2020: HbA1c calculated from fructosamine has improved to 7.1%. 03/04/2018:  Hba1c calculated from fructosamine is the best in a long time, at 6.84%! 12/19/2017: HbA1c calculated from fructosamine has improved to 7.1%! 09/12/2017: HbA1c  Calculated from the fructosamine is 7.5%, improved from before. 05/07/2017: HbA1c calculated from the fructosamine was 7.7% 02/07/2017: HbA1c calculated from the fructosamine was higher, at 7.7%. 11/07/2016: HbA1c calc. from the fructosamine is a little lower, at 7.25% 08/07/2016: HbA1c calculated from the fructosamine is 7.5% 05/31/2016: HbA1c calculated from fructosamine is better, at 7.25% 12/15/2015: HbA1c calculated from the fructosamine is higher, 8.4%. 08/24/2015:  HbA1c calculated from fructosamine is 7.4%. 05/24/2015: HbA1c calculated from fructosamine is 6.9%.  She is on: - Metformin 1000 mg with dinner >> 500 mg 2x a day - Lantus 20 units in am and 25 >> 28 units at night - NovoLog:  13-15 units before the 3 meals - Novolog Sliding scale: 201-225: + 2 units 225-250: + 3 units 251-275: + 4 units >275: + 5 units If your sugars are <100 and you eat a low carb meal, inject only ~6 units. So, If sugars are <100 before the meals, you still need to take insulin for that meal. Do not correct sugars <300 at bedtime. If sugars are <70  before a meal, bring the sugar up first, then inject insulin for the meals.  Pt checks her sugars >4x a day with her Dexcom CGM:  Previously:  Previously:  Previously:   Lowest sugar was 32 >>... 40 >> 53 >> 53 >> 100 >> 55; she has hypoglycemia awareness in the 50s Highest sugar was  383 >> 358 >> 300s >> 494.  No CKD, last BUN/creatinine:  Lab Results  Component Value Date   BUN 16 09/10/2022   CREATININE 0.73 09/10/2022   No microalbuminuria: Lab Results  Component Value Date   MICRALBCREAT 5.4 11/07/2016    + Dyslipidemia: Lab Results  Component Value Date   CHOL 178 10/17/2020   HDL 45.70 10/17/2020   LDLCALC 100 (H) 10/17/2020   TRIG 165.0 (H) 10/17/2020   CHOLHDL 4 10/17/2020  She was started on Crestor 10 mg daily - but taken off, on Ezetimibe now.  -Last eye exam was 2024: No DR reportedly, but dry eyes >> Dr Dione Booze. Had cataract sx's.  -+ Numbness and tingling in her feet.  Also, occasional pain.  She was on Neurontin, but now only on nortriptyline.  These are refilled by PCP.  She is also on alpha-lipoic acid. She sees a podiatrist [facility]).  She had 2 toenails removed in the past due to fungal infection.  Last foot exam 08/25/2021.  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  She has a diagnosis of Parkinson's disease.  She also has bipolar disease. Also: GERD, HTN, fibromyalgia.  Latest TSH was normal: 07/31/2022: TSH 1.13, Lab Results  Component Value Date   TSH 0.533 03/03/2020   ROS: + See HPI Neurological: no tremors/+ numbness/+ tingling/no dizziness, + disequilibrium  I reviewed pt's medications, allergies, PMH, social hx, family hx, and changes were documented in the history of present illness. Otherwise, unchanged from my initial visit note.  Past Medical History:  Diagnosis Date   Anxiety    Bipolar 1 disorder (HCC)    Breast density 06/25/2012   Right breast density, will get mammogram and [place]   Depression    Diabetes mellitus without complication (HCC)    type 1   Duodenitis    Fibromyalgia    GERD (gastroesophageal reflux disease)    History of kidney stones    HTN (hypertension)    Hyperlipidemia    IBS (irritable bowel syndrome)    Neuropathy    Sepsis (HCC) 02/27/2019   Sepsis (HCC) 2023    Past Surgical History:  Procedure Laterality Date   ABDOMINAL HYSTERECTOMY     partial   CHOLECYSTECTOMY     COLONOSCOPY     CYSTOSCOPY WITH RETROGRADE PYELOGRAM, URETEROSCOPY AND STENT PLACEMENT Right 03/31/2019   Procedure: CYSTOSCOPY WITH RETROGRADE PYELOGRAM, URETEROSCOPY AND STENT PLACEMENT;  Surgeon: Noel Christmas, MD;  Location: [facility] [place];  Service: Urology;  Laterality: Right;   CYSTOSCOPY WITH STENT PLACEMENT Right 03/02/2019   Procedure: CYSTOSCOPY WITH STENT PLACEMENT;  Surgeon: Noel Christmas, MD;  Location: [facility] OR;  Service: Urology;  Laterality: Right;   HOLMIUM LASER APPLICATION Right 03/31/2019   Procedure: HOLMIUM LASER APPLICATION;  Surgeon: Noel Christmas, MD;  Location: [facility];  Service: Urology;  Laterality: Right;   UPPER GASTROINTESTINAL ENDOSCOPY     Social History   Socioeconomic History   Marital status: Married    Spouse name: phillip   Number of children: 1   Years of education: 12   Highest education level:  High school graduate  Occupational History   Occupation: Disabled  Tobacco Use   Smoking status: Never   Smokeless tobacco: Never  Vaping Use   Vaping status: Some Days   Substances: Flavoring  Substance and Sexual Activity   Alcohol use: No    Alcohol/week: 0.0 standard drinks of alcohol   Drug use: No   Sexual activity: Not Currently  Other Topics Concern   Not on file  Social History Narrative   Lives at home w/ her husband   Left-handed   Caffeine: 1-2 cups of coffee daily   Social Determinants of Health   Financial Resource Strain: Low Risk  (12/07/2016)   Overall Financial Resource Strain (CARDIA)    Difficulty of Paying Living Expenses: Not hard at all  Food Insecurity: No Food Insecurity (12/07/2016)   Hunger Vital Sign    Worried About Running Out of Food in the Last Year: Never true    Ran Out of Food in the Last Year: Never true  Transportation Needs: Unmet Transportation Needs  (12/07/2016)   PRAPARE - Administrator, Civil Service (Medical): Yes    Lack of Transportation (Non-Medical): Yes  Physical Activity: Inactive (12/07/2016)   Exercise Vital Sign    Days of Exercise per Week: 0 days    Minutes of Exercise per Session: 0 min  Stress: Not on file  Social Connections: Somewhat Isolated (12/07/2016)   Social Connection and Isolation Panel [NHANES]    Frequency of Communication with Friends and Family: Never    Frequency of Social Gatherings with Friends and Family: Never    Attends Religious Services: More than 4 times per year    Active Member of [employer] or Organizations: No    Attends Banker Meetings: Never    Marital Status: Married  Catering manager Violence: Not At Risk (12/07/2016)   Humiliation, Afraid, Rape, and Kick questionnaire    Fear of Current or Ex-Partner: No    Emotionally Abused: No    Physically Abused: No    Sexually Abused: No   Current Outpatient Medications on File Prior to Visit  Medication Sig Dispense Refill   acetaminophen (TYLENOL) 500 MG tablet Take 1,000 mg by mouth every 6 (six) hours as needed for mild pain or headache.     ALPRAZolam (XANAX) 1 MG tablet Take 1 mg by mouth 4 (four) times daily as needed for anxiety.      AMBULATORY NON FORMULARY MEDICATION Squatty Potty x 1 1 each 0   amitriptyline (ELAVIL) 25 MG tablet Take 25 mg by mouth at bedtime.     amLODipine (NORVASC) 5 MG tablet Take 5 mg by mouth daily.     aspirin EC 81 MG tablet Take 1 tablet (81 mg total) by mouth daily. Swallow whole. 30 tablet 11   BD PEN NEEDLE NANO U/F 32G X 4 MM MISC USE WITH INSULIN PEN FOUR TIMES A DAY 400 each 3   BENICAR 40 MG tablet Take 40 mg by mouth daily.     busPIRone (BUSPAR) 10 MG tablet Take 10 mg by mouth in the morning and at bedtime.     Butalbital-APAP-Caffeine 50-325-40 MG capsule Take 1 capsule by mouth 4 (four) times daily as needed.     cholecalciferol (VITAMIN D3) 25 MCG (1000 UNIT) tablet Take  1,000 Units by mouth daily.     Continuous Blood Gluc Receiver (DEXCOM G6 RECEIVER) DEVI Use as instructed to check blood sugar. 1 each 0   Continuous Glucose  Sensor (DEXCOM G6 SENSOR) MISC Use as instructed to check blood sugar change every 10 days 9 each 3   Continuous Glucose Transmitter (DEXCOM G6 TRANSMITTER) MISC Use as instructed to check blood sugar. Change every 90 days 1 each 3   Docusate Sodium (COLACE PO) Take 100 mg by mouth daily.     esomeprazole (NEXIUM) 40 MG capsule Take 40 mg by mouth 2 (two) times daily.     ezetimibe (ZETIA) 10 MG tablet Take 10 mg by mouth daily.     Glucagon (BAQSIMI ONE PACK) 3 MG/DOSE POWD Use 1 spray in the nose as needed for hypoglycemia 1 each PRN   glucose blood (FREESTYLE LITE) test strip USE TO CHECK BLOOD SUGAR 6 TIMES PER DAY 600 each 3   insulin aspart (NOVOLOG FLEXPEN) 100 UNIT/ML FlexPen INJECT 12 TO 16 UNITS UNDER THE SKIN AT THE START OF THE THREE MAIN MEALS AS DIRECTED (Patient taking differently: INJECT 13 TO 15 UNITS UNDER THE SKIN AT THE START OF THE THREE MAIN MEALS AS DIRECTED) 45 mL 3   insulin glargine (LANTUS SOLOSTAR) 100 UNIT/ML Solostar Pen INJECT 48 UNITS UNDER THE SKIN DAILY AS ADVISED 45 mL 4   Insulin Syringes, Disposable, U-100 1 ML MISC To use for insulin injection.. 100 each 1   Lancets (FREESTYLE) lancets USE TO TEST BLOOD SUGAR 4 TO 6 TIMES DAILY AS INSTRUCTED 300 each 6   losartan (COZAAR) 25 MG tablet Take 25 mg by mouth daily.     metFORMIN (GLUCOPHAGE) 500 MG tablet TAKE 1 TABLET TWICE A DAY WITH MEALS 180 tablet 3   methocarbamol (ROBAXIN) 500 MG tablet Take 500 mg by mouth 4 (four) times daily as needed.     nortriptyline (PAMELOR) 50 MG capsule Take 50 mg by mouth at bedtime.      ondansetron (ZOFRAN-ODT) 4 MG disintegrating tablet PLACE 1 TO 2 TABLETS ON TONGUE EVERY 4 TO 6 HOURS AS NEEDED FOR NAUSEA (Patient taking differently: Take 4-8 mg by mouth every 4 (four) hours as needed for nausea or vomiting.) 180 tablet 4    OSCIMIN 0.125 MG SUBL DISSOLVE 1 TABLET UNDER THE TONGUE EVERY 4 HOURS AS NEEDED (NEED APPOINTMENT) 360 tablet 0   pregabalin (LYRICA) 50 MG capsule START WITH TWICE DAILY. IF NO SIDE EFFECTS IN 1 WEEK MAY INCREASE TO 3X DAILY.*STOP GABAPENTIN* 90 capsule 2   Probiotic CAPS Take 1 capsule by mouth daily. 30 capsule 1   Propylene Glycol (SYSTANE BALANCE OP) Place 1 drop into both eyes daily as needed (dry eyes).     traZODone (DESYREL) 100 MG tablet 1  qhs  May increse to 2  qhs  After 1 week (Patient taking differently: Take 100 mg by mouth at bedtime. 1  qhs  May increse to 2  qhs  After 1 week) 180 tablet 1   venlafaxine (EFFEXOR) 37.5 MG tablet Take 37.5 mg by mouth daily.     vitamin B-12 (CYANOCOBALAMIN) 1000 MCG tablet Take 1,000 mcg by mouth daily.     Vitamin D, Ergocalciferol, (DRISDOL) 1.25 MG (50000 UNIT) CAPS capsule Take 50,000 Units by mouth once a week.     Current Facility-Administered Medications on File Prior to Visit  Medication Dose Route Frequency Provider Last Rate Last Admin   bupivacaine (MARCAINE) 0.5 % (with pres) injection 1 mL  1 mL Infiltration Once Anson Fret, MD       lidocaine (XYLOCAINE) 2 % (with pres) injection 20 mg  1 mL Intradermal Once  Anson Fret, MD       methylPREDNISolone acetate (DEPO-MEDROL) injection 80 mg  80 mg Intramuscular Once Anson Fret, MD       Allergies  Allergen Reactions   Abilify [Aripiprazole]     Patient is intolerant   Phenergan [Promethazine] Diarrhea and Nausea And Vomiting   Reglan [Metoclopramide] Other (See Comments)    Chest pains   Family History  Problem Relation Age of Onset   Hypertension Mother    Bipolar disorder Mother    Cancer Mother    Depression Father    Cancer Father    Thyroid disease Sister    Hypertension Sister    Bipolar disorder Sister    Heart disease Brother    Bipolar disorder Maternal Aunt    Bipolar disorder Daughter    Colon cancer Neg Hx    Colon polyps Neg Hx     Esophageal cancer Neg Hx    Kidney disease Neg Hx    Gallbladder disease Neg Hx    Diabetes Neg Hx    Dementia Neg Hx    Tremor Neg Hx    Seizures Neg Hx    Migraines Neg Hx    Headache Neg Hx     PE: BP 122/64   Pulse (!) 116   Ht 5\' 4"  (1.626 m)   Wt 179 lb 12.8 oz (81.6 kg)   SpO2 94%   BMI 30.86 kg/m    Wt Readings from Last 3 Encounters:  09/11/22 179 lb 12.8 oz (81.6 kg)  05/07/22 179 lb 12.8 oz (81.6 kg)  01/09/22 172 lb (78 kg)   Constitutional: slightly overweight, in NAD Eyes: EOMI, no exophthalmos ENT: no thyromegaly, no cervical lymphadenopathy Cardiovascular: Tachycardia, RR, No MRG Respiratory: CTA B Musculoskeletal: no deformities Skin: + many tatoos Neurological: no tremor with outstretched hands  ASSESSMENT: 1. 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  Component     Latest Ref Rng 07/16/2013  C-Peptide     0.80 - 3.90 ng/mL 1.29  Glucose     70 - 99 mg/dL 469 (H)  Glutamic Acid Decarb Ab     <=1.0 U/mL 11.3 (H)  Pancreatic Islet Cell Antibody     <5 JDF Units 5 (A)  + anti-pancreatic antibodies >> LADA rather than type 2 DM. She still has a positive C peptide >> still has insulin secretion.  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 HL  PLAN:  1.  Patient with poorly controlled LADA, difficult to manage due to depression/anxiety/GI symptoms.  Her sugars usually fluctuate between 30s and 300s.  She was previously on insulin pump but she could not continue due to anxiety.  She is back on a CGM now. -At last visit, sugars were fluctuating around the upper limit of the target range during the day but increasing significantly after dinner and staying elevated overnight.  Upon questioning, patient was having many dietary indiscretions after dinner, snacking on sweets and then going to bed late.  She realized that this was raising her  blood sugars and causing her to gain weight (7 pounds since the previous visit), so she stopped snacking at night few days prior to the appointment.  We did not change her regimen pending an improvement in her blood sugars after dietary changes. CGM interpretation: -At today's visit, we reviewed her CGM downloads: It appears that 19% of values are in target range (  goal >70%), while 81% are higher than 180 (goal <25%), and 0% are lower than 70 (goal <4%).  The calculated average blood sugar is 245.  The projected HbA1c for the next 3 months ([facility]) is 9.2%. -Reviewing the CGM trends, sugars appear to be very high, barely touching the normal range, with a significant increase especially after dinner.  It does look like the insulin is not very active for her.  They do not feel that it may be degraded.  Also, it is not expired.  She is not injecting in scar tissue, but she uses approximately the same places on her abdomen.  I advised her to move the injection sites to the upper thighs but at today's visit I also suggested to change from Lantus to Toujeo and increase the dose as needed and also from NovoLog to Lyumjev.  I advised her to inject Lyumjev right before the meals.  I did not suggest a change in doses, however. - I suggested to:  Patient Instructions  Please continue: - Metformin 500 mg 2x a day with meals  Change from Lantus to: - Toujeo 44 units in am  - may need to increase dose to 48 or even 50 units if sugars remain high (tonight take 30 units only)  Change from NovoLog to: - Lyumjev:  13-15 units before the 3 meals - Lyumjev Sliding scale: 201-225: + 2 units 225-250: + 3 units 251-275: + 4 units >275: + 5 units If your sugars are <100 and you eat a low carb meal, inject only ~6 units. So, If sugars are <100 before the meals, you still need to take insulin for that meal. Do not correct sugars <300 at bedtime. If sugars are <70 before a meal, bring the sugar up first, then inject  insulin for the meals. Allow the sugars to drop to 80 before you start correcting them.  Please return in 2 months.  - we checked her HbA1c: 8% (higher) - advised to check sugars at different times of the day - 4x a day, rotating check times - advised for yearly eye exams >> she is UTD - return to clinic in 2 months  2. PN  -Related to diabetes -She continues nortriptyline and Lyrica; she was on gabapentin in the past but this was tapered off - she is also on alpha lipoic acid 600 mg twice a day; also on Nervine Roll On -He had physical therapy for this in the past  3. HL -Latest lipid panel available for review is from 10/2020: LDL above target, triglycerides slightly high: Lab Results  Component Value Date   CHOL 178 10/17/2020   HDL 45.70 10/17/2020   LDLCALC 100 (H) 10/17/2020   TRIG 165.0 (H) 10/17/2020   CHOLHDL 4 10/17/2020  -She was previously on Crestor 10 mg daily but came off (she did not remember why) but currently on Zetia 10 mg daily. -She had repeat lipid panel afterwards, but I do not have these results.  Husband mentions that he will go by Dr. Sharyon Medicus office to these results and bring them to me.  Carlus Pavlov, MD PhD [facility] Endocrinology

## 2022-09-12 ENCOUNTER — Telehealth: Payer: Self-pay

## 2022-09-12 ENCOUNTER — Other Ambulatory Visit (HOSPITAL_COMMUNITY): Payer: Self-pay

## 2022-09-12 ENCOUNTER — Encounter: Payer: Self-pay | Admitting: Internal Medicine

## 2022-09-12 DIAGNOSIS — E139 Other specified diabetes mellitus without complications: Secondary | ICD-10-CM

## 2022-09-12 NOTE — Telephone Encounter (Signed)
Pharmacy Patient Advocate Encounter   Received notification from Pt Calls Messages that prior authorization for Toujeo is required/requested.   Insurance verification completed.   The patient is insured through [employer] .   Per test claim:  Lantus is preferred by the insurance.  If suggested medication is appropriate, Please send in a new RX and discontinue this one. If not, please advise as to why it's not appropriate so that we may request a Prior Authorization.

## 2022-09-12 NOTE — Telephone Encounter (Signed)
Patient needs a PA for Toujeo.

## 2022-09-13 ENCOUNTER — Other Ambulatory Visit (HOSPITAL_COMMUNITY): Payer: Self-pay

## 2022-09-13 MED ORDER — TRESIBA FLEXTOUCH 200 UNIT/ML ~~LOC~~ SOPN
44.0000 [IU] | PEN_INJECTOR | Freq: Every morning | SUBCUTANEOUS | 2 refills | Status: DC
Start: 2022-09-13 — End: 2023-04-02

## 2022-09-13 NOTE — Telephone Encounter (Signed)
Please advise on Alternative

## 2022-09-13 NOTE — Telephone Encounter (Signed)
Let's try Tresiba U200 at the same doses Toujeo.  Please let me know if this is not covered.

## 2022-09-13 NOTE — Telephone Encounter (Signed)
Please start PA for [place] u-200

## 2022-09-14 ENCOUNTER — Other Ambulatory Visit: Payer: Self-pay | Admitting: Internal Medicine

## 2022-09-14 ENCOUNTER — Telehealth: Payer: Self-pay

## 2022-09-14 NOTE — Telephone Encounter (Signed)
Pharmacy Patient Advocate Encounter   Received notification from Pt Calls Messages that prior authorization for [name] is required/requested.  Per test claim: PA required; PA started via CoverMyMeds. KEY [id number] . Waiting for clinical questions to populate.

## 2022-09-14 NOTE — Telephone Encounter (Signed)
Clinical info including chart notes, not about lantus, and labs have been submitted

## 2022-09-17 ENCOUNTER — Other Ambulatory Visit: Payer: Self-pay | Admitting: Internal Medicine

## 2022-09-17 DIAGNOSIS — G629 Polyneuropathy, unspecified: Secondary | ICD-10-CM | POA: Diagnosis not present

## 2022-09-17 DIAGNOSIS — E139 Other specified diabetes mellitus without complications: Secondary | ICD-10-CM

## 2022-09-17 DIAGNOSIS — E6609 Other obesity due to excess calories: Secondary | ICD-10-CM | POA: Diagnosis not present

## 2022-09-17 DIAGNOSIS — E1159 Type 2 diabetes mellitus with other circulatory complications: Secondary | ICD-10-CM | POA: Diagnosis not present

## 2022-09-17 DIAGNOSIS — G4700 Insomnia, unspecified: Secondary | ICD-10-CM | POA: Diagnosis not present

## 2022-09-17 DIAGNOSIS — Z6830 Body mass index (BMI) 30.0-30.9, adult: Secondary | ICD-10-CM | POA: Diagnosis not present

## 2022-09-17 DIAGNOSIS — F0390 Unspecified dementia without behavioral disturbance: Secondary | ICD-10-CM | POA: Diagnosis not present

## 2022-09-17 DIAGNOSIS — F3110 Bipolar disorder, current episode manic without psychotic features, unspecified: Secondary | ICD-10-CM | POA: Diagnosis not present

## 2022-09-17 DIAGNOSIS — E1140 Type 2 diabetes mellitus with diabetic neuropathy, unspecified: Secondary | ICD-10-CM | POA: Diagnosis not present

## 2022-09-17 NOTE — Telephone Encounter (Signed)
Good morning,  Any update on the status?

## 2022-09-18 NOTE — Telephone Encounter (Signed)
[name], Let's see if the sugars improved after she moved to the injection sites.  If not, please check with her what the lowest blood sugar was more recently.   She sometimes can have blood sugars as low as the 30s.  More recently, she had lows in the 50s.  Whenever she has a low, she panics and it is very difficult to bring the blood sugars to normal, she may over wreck with subsequent hyperglycemia afterwards.   We discussed at length at every visit about dosing of insulin and she is doing a good job taking this. Thank you! CG

## 2022-09-19 DIAGNOSIS — E119 Type 2 diabetes mellitus without complications: Secondary | ICD-10-CM | POA: Diagnosis not present

## 2022-09-19 DIAGNOSIS — Z961 Presence of intraocular lens: Secondary | ICD-10-CM | POA: Diagnosis not present

## 2022-09-19 DIAGNOSIS — H04123 Dry eye syndrome of bilateral lacrimal glands: Secondary | ICD-10-CM | POA: Diagnosis not present

## 2022-09-19 NOTE — Telephone Encounter (Signed)
I called and spoke with the husband, he states that her blood sugar has been running 135-150s during the day, but 1st thing in the morning Her blood sugar has been elevated highest (300s). No lows in the 50s or 30s

## 2022-09-19 NOTE — Telephone Encounter (Signed)
I reviewed her Dexcom tracings for the last few days.  Sugars appear to be slightly better but they are still higher overnight and they increase again after dinner.  I would continue with the same doses of Lyumjev for now, but Lantus is still not working very well for her.  Maybe we can submit another PA for Toujeo as she definitely fails Lantus.   In the meantime, per my records, she is taking 20 units in a.m. and 28 units of Lantus at bedtime.  We can increase the dose of Lantus at bedtime to 32 units and see how this goes.  Please make sure that she is taking Lyumjev at the beginning of each meal and adjusting the dose as discussed based on the size of the meals.

## 2022-09-20 NOTE — Telephone Encounter (Signed)
Pt husband has been advised and voices understanding.

## 2022-09-21 ENCOUNTER — Telehealth: Payer: Self-pay

## 2022-09-21 NOTE — Telephone Encounter (Signed)
Patient called to let us know what her blood sugar has been for the last few hours and what she has been taking.  Last night at 9:30pm: 28 units of Lantus @5 :30 am: blood sugar 326, and took 9 units of Lyumjev @6am : 20 units of Lantus was taken @6 :30am: she ate Oatmeal with fruit @7 :15am: blood sugar 244 @8am : blood sugar 300, 10 units of Lyumjev was taken.

## 2022-09-21 NOTE — Telephone Encounter (Signed)
Sample  Medication:Tresiba  Dose: U-200 Quantity:2 boxes (6 pens) QIO:[id number]  EXP: 06/30/24  Dicie Beam

## 2022-09-21 NOTE — Telephone Encounter (Signed)
Spouse picked up 2 sample boxes of [place]

## 2022-09-21 NOTE — Telephone Encounter (Signed)
Lantus appears to really not work well for her.  Let's give her 2 samples of Tresiba U200 pens to try. She can start by injecting 40 units of [place] daily (she does not need to split the dose) and see how this goes.  We may need to increase the dose in few days, if sugars not improving.  For now, I would continue the same dose of Lyumjev.

## 2022-09-21 NOTE — Telephone Encounter (Signed)
Hello [name]  Can we please resubmit a PA for [name] I have updated her allergy list because the Lantus has not been improving her blood sugars at all. We are giving her samples of [place] today, but when she runs out she is going to need another insulin.

## 2022-09-21 NOTE — Telephone Encounter (Signed)
Pt husband has been notified and voices understanding.  Samples have been labeled.

## 2022-09-24 ENCOUNTER — Other Ambulatory Visit (HOSPITAL_COMMUNITY): Payer: Self-pay

## 2022-09-27 DIAGNOSIS — F411 Generalized anxiety disorder: Secondary | ICD-10-CM | POA: Diagnosis not present

## 2022-09-27 DIAGNOSIS — F3181 Bipolar II disorder: Secondary | ICD-10-CM | POA: Diagnosis not present

## 2022-09-27 DIAGNOSIS — Z23 Encounter for immunization: Secondary | ICD-10-CM | POA: Diagnosis not present

## 2022-10-08 NOTE — Telephone Encounter (Signed)
Appeal please.

## 2022-10-09 ENCOUNTER — Telehealth: Payer: Self-pay

## 2022-10-09 NOTE — Telephone Encounter (Signed)
Transition Care Management Follow-up Telephone Call Date of discharge and from where: [facility] 9/9 How have you been since you were released from the hospital? Doing well and has been following up with Providers Any questions or concerns? No  Items Reviewed: Did the pt receive and understand the discharge instructions provided? Yes  Medications obtained and verified? Yes  Other? No  Any new allergies since your discharge? No  Dietary orders reviewed? No Do you have support at home? Yes    Follow up appointments reviewed:  PCP Hospital f/u appt confirmed? Yes  Scheduled to see  on 9/10 @ . Specialist Hospital f/u appt confirmed? Yes  Scheduled to see  on  @ . Are transportation arrangements needed? No  If their condition worsens, is the pt aware to call PCP or go to the Emergency Dept.? Yes Was the patient provided with contact information for the PCP's office or ED? Yes Was to pt encouraged to call back with questions or concerns? Yes

## 2022-10-11 ENCOUNTER — Telehealth: Payer: Self-pay

## 2022-10-11 DIAGNOSIS — F3181 Bipolar II disorder: Secondary | ICD-10-CM | POA: Diagnosis not present

## 2022-10-11 DIAGNOSIS — F411 Generalized anxiety disorder: Secondary | ICD-10-CM | POA: Diagnosis not present

## 2022-10-11 NOTE — Telephone Encounter (Signed)
Sample  Medication:Tresiba Dose: U-200 Quantity:1 box ZOX:[id number] EXP:06/30/2024  Dicie Beam

## 2022-10-15 ENCOUNTER — Telehealth: Payer: Self-pay

## 2022-10-15 MED ORDER — LYUMJEV KWIKPEN 200 UNIT/ML ~~LOC~~ SOPN: 13.0000 [IU] | PEN_INJECTOR | Freq: Three times a day (TID) | SUBCUTANEOUS | 3 refills | Status: DC

## 2022-10-15 NOTE — Telephone Encounter (Signed)
Spouse picked up sample of Tresiba

## 2022-10-15 NOTE — Telephone Encounter (Signed)
Per Dr. Elvera Lennox please provide 2 boxes of Tresiba     Sample  Medication:Tresiba Dose: U-200 Quantity:2 boxes  VHQ:[id number] EXP:06/30/24  Dicie Beam

## 2022-10-15 NOTE — Addendum Note (Signed)
Addended by: Pollie Meyer on: 10/15/2022 08:46 AM   Modules accepted: Orders

## 2022-10-17 ENCOUNTER — Other Ambulatory Visit (HOSPITAL_COMMUNITY): Payer: Self-pay

## 2022-10-17 NOTE — Telephone Encounter (Signed)
Appeal was submitted and insurance requested additional information. That information was faxed back today

## 2022-10-22 DIAGNOSIS — F419 Anxiety disorder, unspecified: Secondary | ICD-10-CM | POA: Diagnosis not present

## 2022-10-22 DIAGNOSIS — E663 Overweight: Secondary | ICD-10-CM | POA: Diagnosis not present

## 2022-10-22 DIAGNOSIS — E1140 Type 2 diabetes mellitus with diabetic neuropathy, unspecified: Secondary | ICD-10-CM | POA: Diagnosis not present

## 2022-10-22 DIAGNOSIS — F3110 Bipolar disorder, current episode manic without psychotic features, unspecified: Secondary | ICD-10-CM | POA: Diagnosis not present

## 2022-10-22 DIAGNOSIS — G629 Polyneuropathy, unspecified: Secondary | ICD-10-CM | POA: Diagnosis not present

## 2022-10-22 DIAGNOSIS — F0390 Unspecified dementia without behavioral disturbance: Secondary | ICD-10-CM | POA: Diagnosis not present

## 2022-10-22 DIAGNOSIS — Z6829 Body mass index (BMI) 29.0-29.9, adult: Secondary | ICD-10-CM | POA: Diagnosis not present

## 2022-10-24 NOTE — Telephone Encounter (Signed)
Any update on this?

## 2022-10-24 NOTE — Telephone Encounter (Signed)
Appeal was also denied. Letter will be attached to the media section of pt chart. Please see denial reason below:

## 2022-10-25 NOTE — Telephone Encounter (Signed)
Patients husband picked up 2 boxes of Tresiba (samples) 10/25/22

## 2022-10-30 DIAGNOSIS — K5900 Constipation, unspecified: Secondary | ICD-10-CM | POA: Diagnosis not present

## 2022-10-30 DIAGNOSIS — M47812 Spondylosis without myelopathy or radiculopathy, cervical region: Secondary | ICD-10-CM | POA: Diagnosis not present

## 2022-10-30 DIAGNOSIS — M797 Fibromyalgia: Secondary | ICD-10-CM | POA: Diagnosis not present

## 2022-10-30 DIAGNOSIS — G894 Chronic pain syndrome: Secondary | ICD-10-CM | POA: Diagnosis not present

## 2022-11-01 DIAGNOSIS — G894 Chronic pain syndrome: Secondary | ICD-10-CM | POA: Diagnosis not present

## 2022-11-01 DIAGNOSIS — Z79891 Long term (current) use of opiate analgesic: Secondary | ICD-10-CM | POA: Diagnosis not present

## 2022-11-08 DIAGNOSIS — F3181 Bipolar II disorder: Secondary | ICD-10-CM | POA: Diagnosis not present

## 2022-11-08 DIAGNOSIS — F411 Generalized anxiety disorder: Secondary | ICD-10-CM | POA: Diagnosis not present

## 2022-11-13 DIAGNOSIS — F411 Generalized anxiety disorder: Secondary | ICD-10-CM | POA: Diagnosis not present

## 2022-11-13 DIAGNOSIS — F3181 Bipolar II disorder: Secondary | ICD-10-CM | POA: Diagnosis not present

## 2022-11-14 ENCOUNTER — Telehealth: Payer: Self-pay

## 2022-11-14 NOTE — Telephone Encounter (Signed)
Yes, all of these can cause high blood sugars.  Unfortunately, until we get a handle on these, the sugars may remain fluctuating.

## 2022-11-14 NOTE — Telephone Encounter (Signed)
Patient called wanting know does Stress, Depression, and Panic attacks cause elevated blood sugars? Her Highest sugar was 400 and now its at 195, but when she is having these mental health crisis she just wanted to know if its true.

## 2022-11-14 NOTE — Telephone Encounter (Signed)
 Patient has been notified and voices understanding.

## 2022-11-15 ENCOUNTER — Ambulatory Visit: Payer: Medicare Other | Admitting: Internal Medicine

## 2022-11-19 DIAGNOSIS — F3181 Bipolar II disorder: Secondary | ICD-10-CM | POA: Diagnosis not present

## 2022-11-19 DIAGNOSIS — F411 Generalized anxiety disorder: Secondary | ICD-10-CM | POA: Diagnosis not present

## 2022-11-26 DIAGNOSIS — F411 Generalized anxiety disorder: Secondary | ICD-10-CM | POA: Diagnosis not present

## 2022-11-26 DIAGNOSIS — F3181 Bipolar II disorder: Secondary | ICD-10-CM | POA: Diagnosis not present

## 2022-11-27 ENCOUNTER — Encounter: Payer: Self-pay | Admitting: Internal Medicine

## 2022-11-27 ENCOUNTER — Telehealth: Payer: Medicare Other | Admitting: Internal Medicine

## 2022-11-27 DIAGNOSIS — G63 Polyneuropathy in diseases classified elsewhere: Secondary | ICD-10-CM | POA: Diagnosis not present

## 2022-11-27 DIAGNOSIS — E139 Other specified diabetes mellitus without complications: Secondary | ICD-10-CM | POA: Diagnosis not present

## 2022-11-27 DIAGNOSIS — E785 Hyperlipidemia, unspecified: Secondary | ICD-10-CM

## 2022-11-27 NOTE — Progress Notes (Signed)
Patient ID: [name], female   DOB: 06-23-1955, 67 y.o.   MRN: [id number]  Patient location: Home My location: Office Persons participating in the virtual visit: patient, pt's husband, provider  Referring Provider: Dr. Sherwood Gambler  I connected with the patient on 11/27/22 at  2:30 PM EST by a video enabled telemedicine application and verified that I am speaking with the correct person.   I discussed the limitations of evaluation and management by telemedicine and the availability of in person appointments. The patient expressed understanding and agreed to proceed.   Details of the encounter are shown below.  HPI: [name] is a 67 y.o.-year-old female, initially referred by her PCP, Dr. Assunta Found (PA: Lenise Herald), returning for f/u for LADA, dx 2005, insulin-dependent since ~2006, controlled, with complications (PN, gastroparesis?).  Last visit 2 months ago.    Interim history: She has bipolar disease and also has short-term memory loss, tension headaches, disequilibrium for which she sees neurology.  She also has anxiety and sees a Veterinary surgeon.   At today's visit, patient's husband mentions that she has a lot of headaches, posterior neck pain, and immobility.  She was referred to neurology/neurosurgery, but she will need to see her psychiatrist first to stop Xanax first. She also has problems focusing her vision.  Reviewed history: She was on an Omnipod insulin pump on 08/01/2015. She had severe anxiety and depression and got so overwhelmed with the insulin pump that we had to take her off the pump.  We switched her to a basal-bolus insulin regimen, but she was unable to calculate the insulin doses based on insulin to carb ratio, so now she is using fixed rapid acting insulin doses.  Reviewed HbA1c levels: Lab Results  Component Value Date   HGBA1C 8.0 (A) 09/11/2022   HGBA1C 7.2 (A) 05/07/2022   HGBA1C 5.0 03/03/2020  05/07/2022: HbA1c calculated from fructosamine is 8.4%,  slightly lower than before. 08/25/2021: HbA1c calculated from fructosamine is 8.6%, higher than before. 04/25/2021: HbA1c calculated from fructosamine is approximately 7.3%, higher than before.  03/2021: Reportedly HbA1c 4.1% in Dr. Sharyon Medicus office. 01/24/2021: HbA1c calculated from fructosamine is 6.9%. 10/17/2020: HbA1c calculated from fructosamine is 7.25%. 07/11/2020: HbA1c calculated from fructosamine is 7.3%, slightly higher than before. 04/21/2020: HbA1c calculated from fructosamine has improved to 7.1%. 03/04/2018:  Hba1c calculated from fructosamine is the best in a long time, at 6.84%! 12/19/2017: HbA1c calculated from fructosamine has improved to 7.1%! 09/12/2017: HbA1c  Calculated from the fructosamine is 7.5%, improved from before. 05/07/2017: HbA1c calculated from the fructosamine was 7.7% 02/07/2017: HbA1c calculated from the fructosamine was higher, at 7.7%. 11/07/2016: HbA1c calc. from the fructosamine is a little lower, at 7.25% 08/07/2016: HbA1c calculated from the fructosamine is 7.5% 05/31/2016: HbA1c calculated from fructosamine is better, at 7.25% 12/15/2015: HbA1c calculated from the fructosamine is higher, 8.4%. 08/24/2015:  HbA1c calculated from fructosamine is 7.4%. 05/24/2015: HbA1c calculated from fructosamine is 6.9%.  She is on: - Metformin 1000 mg with dinner >> 500 mg 2x a day - Lantus 20 units in am and 25 >> 28 units at night >> Tresiba  46 units daily - NovoLog: >> Lyumjev 10-12 units before meals - Novolog Sliding scale: >> Lyumjev 201-225: + 2 units 225-250: + 3 units 251-275: + 4 units >275: + 5 units If your sugars are <100 and you eat a low carb meal, inject only ~6 units. So, If sugars are <100 before the meals, you still need to take insulin for that  meal. Do not correct sugars <300 at bedtime. If sugars are <70 before a meal, bring the sugar up first, then inject insulin for the meals.  Pt checks her sugars >4x a day with her Dexcom  CGM:    Previously:   Lowest sugar was 32 >>... 40 >> ... 55 >> >100; she has hypoglycemia awareness in the 50s Highest sugar was 358 >> 300s >> 494 >> 393. She was in the emergency room in 09/2022  after having had blood sugar of 494.  She was given IV insulin and hydrated and discharged.  No signs of DKA. At today's visit, CBG 432.  They mention that she started to have high blood sugars approximately 3 days ago.  She does mention several panic attacks in this period.  No CKD, last BUN/creatinine:  Lab Results  Component Value Date   BUN 16 09/10/2022   CREATININE 0.73 09/10/2022   No microalbuminuria: Lab Results  Component Value Date   MICRALBCREAT 5.4 11/07/2016    + Dyslipidemia: Lab Results  Component Value Date   CHOL 178 10/17/2020   HDL 45.70 10/17/2020   LDLCALC 100 (H) 10/17/2020   TRIG 165.0 (H) 10/17/2020   CHOLHDL 4 10/17/2020  She was started on Crestor 10 mg daily - but taken off, on Ezetimibe now.  -Last eye exam was 07/2022: No DR reportedly, but dry eyes >> Dr Dione Booze. Had cataract sx's.  -+ Numbness and tingling in her feet.  Also, occasional pain.  She was on Neurontin, but now only on nortriptyline.  These are refilled by PCP.  She is also on alpha-lipoic acid.  She sees a podiatrist [facility]).  She had 2 toenails removed in the past due to fungal infection.  Last foot exam 08/25/2021.  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  She has a diagnosis of Parkinson's disease.  She also has bipolar disease. Also: GERD, HTN, fibromyalgia.  Latest TSH was normal: 07/31/2022: TSH 1.13 Lab Results  Component Value Date   TSH 0.533 03/03/2020   ROS: + See HPI  I reviewed pt's medications, allergies, PMH, social hx, family hx, and changes were documented in the history of present illness. Otherwise, unchanged from my initial visit note.  Past Medical History:  Diagnosis Date   Anxiety    Bipolar 1 disorder (HCC)    Breast density  06/25/2012   Right breast density, will get mammogram and [place]   Depression    Diabetes mellitus without complication (HCC)    type 1   Duodenitis    Fibromyalgia    GERD (gastroesophageal reflux disease)    History of kidney stones    HTN (hypertension)    Hyperlipidemia    IBS (irritable bowel syndrome)    Neuropathy    Sepsis (HCC) 02/27/2019   Sepsis (HCC) 2023   Past Surgical History:  Procedure Laterality Date   ABDOMINAL HYSTERECTOMY     partial   CHOLECYSTECTOMY     COLONOSCOPY     CYSTOSCOPY WITH RETROGRADE PYELOGRAM, URETEROSCOPY AND STENT PLACEMENT Right 03/31/2019   Procedure: CYSTOSCOPY WITH RETROGRADE PYELOGRAM, URETEROSCOPY AND STENT PLACEMENT;  Surgeon: Noel Christmas, MD;  Location: [facility] ;  Service: Urology;  Laterality: Right;   CYSTOSCOPY WITH STENT PLACEMENT Right 03/02/2019   Procedure: CYSTOSCOPY WITH STENT PLACEMENT;  Surgeon: Noel Christmas, MD;  Location: [facility] OR;  Service: Urology;  Laterality: Right;   HOLMIUM LASER APPLICATION Right 03/31/2019   Procedure: HOLMIUM LASER APPLICATION;  Surgeon: Noel Christmas, MD;  Location: [facility];  Service: Urology;  Laterality: Right;   UPPER GASTROINTESTINAL ENDOSCOPY     Social History   Socioeconomic History   Marital status: Married    Spouse name: phillip   Number of children: 1   Years of education: 12   Highest education level: High school graduate  Occupational History   Occupation: Disabled  Tobacco Use   Smoking status: Never   Smokeless tobacco: Never  Vaping Use   Vaping status: Some Days   Substances: Flavoring  Substance and Sexual Activity   Alcohol use: No    Alcohol/week: 0.0 standard drinks of alcohol   Drug use: No   Sexual activity: Not Currently  Other Topics Concern   Not on file  Social History Narrative   Lives at home w/ her husband   Left-handed   Caffeine: 1-2 cups of coffee daily   Social Determinants of Health   Financial  Resource Strain: Low Risk  (12/07/2016)   Overall Financial Resource Strain (CARDIA)    Difficulty of Paying Living Expenses: Not hard at all  Food Insecurity: No Food Insecurity (12/07/2016)   Hunger Vital Sign    Worried About Running Out of Food in the Last Year: Never true    Ran Out of Food in the Last Year: Never true  Transportation Needs: Unmet Transportation Needs (12/07/2016)   PRAPARE - Transportation    Lack of Transportation (Medical): Yes    Lack of Transportation (Non-Medical): Yes  Physical Activity: Inactive (12/07/2016)   Exercise Vital Sign    Days of Exercise per Week: 0 days    Minutes of Exercise per Session: 0 min  Stress: Not on file  Social Connections: Somewhat Isolated (12/07/2016)   Social Connection and Isolation Panel [NHANES]    Frequency of Communication with Friends and Family: Never    Frequency of Social Gatherings with Friends and Family: Never    Attends Religious Services: More than 4 times per year    Active Member of [employer] or Organizations: No    Attends Banker Meetings: Never    Marital Status: Married  Catering manager Violence: Not At Risk (12/07/2016)   Humiliation, Afraid, Rape, and Kick questionnaire    Fear of Current or Ex-Partner: No    Emotionally Abused: No    Physically Abused: No    Sexually Abused: No   Current Outpatient Medications on File Prior to Visit  Medication Sig Dispense Refill   acetaminophen (TYLENOL) 500 MG tablet Take 1,000 mg by mouth every 6 (six) hours as needed for mild pain or headache.     ALPRAZolam (XANAX) 1 MG tablet Take 1 mg by mouth 4 (four) times daily as needed for anxiety.      AMBULATORY NON FORMULARY MEDICATION Squatty Potty x 1 1 each 0   amitriptyline (ELAVIL) 25 MG tablet Take 25 mg by mouth at bedtime.     amLODipine (NORVASC) 5 MG tablet Take 5 mg by mouth daily.     aspirin EC 81 MG tablet Take 1 tablet (81 mg total) by mouth daily. Swallow whole. 30 tablet 11   BD PEN NEEDLE NANO  U/F 32G X 4 MM MISC USE WITH INSULIN PEN FOUR TIMES A DAY 400 each 3   BENICAR 40 MG tablet Take 40 mg by mouth daily.     busPIRone (BUSPAR) 10 MG tablet Take 10 mg by mouth in the morning and at bedtime.  Butalbital-APAP-Caffeine 50-325-40 MG capsule Take 1 capsule by mouth 4 (four) times daily as needed.     cholecalciferol (VITAMIN D3) 25 MCG (1000 UNIT) tablet Take 1,000 Units by mouth daily.     Continuous Blood Gluc Receiver (DEXCOM G6 RECEIVER) DEVI Use as instructed to check blood sugar. 1 each 0   Continuous Glucose Sensor (DEXCOM G6 SENSOR) MISC Use as instructed to check blood sugar change every 10 days 9 each 3   Continuous Glucose Transmitter (DEXCOM G6 TRANSMITTER) MISC Use as instructed to check blood sugar. Change every 90 days 1 each 3   Docusate Sodium (COLACE PO) Take 100 mg by mouth daily.     esomeprazole (NEXIUM) 40 MG capsule Take 40 mg by mouth 2 (two) times daily.     ezetimibe (ZETIA) 10 MG tablet Take 10 mg by mouth daily.     Glucagon (BAQSIMI ONE PACK) 3 MG/DOSE POWD Use 1 spray in the nose as needed for hypoglycemia 1 each PRN   glucose blood (FREESTYLE LITE) test strip USE TO CHECK BLOOD SUGAR 6 TIMES PER DAY 600 each 3   insulin degludec (TRESIBA FLEXTOUCH) 200 UNIT/ML FlexTouch Pen Inject 44-48 Units into the skin in the morning. 87 mL 2   insulin glargine, 2 Unit Dial, (TOUJEO MAX SOLOSTAR) 300 UNIT/ML Solostar Pen Inject 44-48 units under skin in am 18 mL 3   Insulin Lispro-aabc (LYUMJEV KWIKPEN) 200 UNIT/ML KwikPen Inject 13-18 Units into the skin 3 (three) times daily before meals. 18 mL 3   Insulin Syringes, Disposable, U-100 1 ML MISC To use for insulin injection.. 100 each 1   Lancets (FREESTYLE) lancets USE TO TEST BLOOD SUGAR 4 TO 6 TIMES DAILY AS INSTRUCTED 300 each 6   losartan (COZAAR) 25 MG tablet Take 25 mg by mouth daily.     metFORMIN (GLUCOPHAGE) 500 MG tablet TAKE 1 TABLET TWICE A DAY WITH MEALS 180 tablet 3   methocarbamol (ROBAXIN) 500 MG  tablet Take 500 mg by mouth 4 (four) times daily as needed.     nortriptyline (PAMELOR) 50 MG capsule Take 50 mg by mouth at bedtime.      ondansetron (ZOFRAN-ODT) 4 MG disintegrating tablet PLACE 1 TO 2 TABLETS ON TONGUE EVERY 4 TO 6 HOURS AS NEEDED FOR NAUSEA (Patient taking differently: Take 4-8 mg by mouth every 4 (four) hours as needed for nausea or vomiting.) 180 tablet 4   OSCIMIN 0.125 MG SUBL DISSOLVE 1 TABLET UNDER THE TONGUE EVERY 4 HOURS AS NEEDED (NEED APPOINTMENT) 360 tablet 0   pregabalin (LYRICA) 50 MG capsule START WITH TWICE DAILY. IF NO SIDE EFFECTS IN 1 WEEK MAY INCREASE TO 3X DAILY.*STOP GABAPENTIN* 90 capsule 2   Probiotic CAPS Take 1 capsule by mouth daily. 30 capsule 1   Propylene Glycol (SYSTANE BALANCE OP) Place 1 drop into both eyes daily as needed (dry eyes).     traZODone (DESYREL) 100 MG tablet 1  qhs  May increse to 2  qhs  After 1 week (Patient taking differently: Take 100 mg by mouth at bedtime. 1  qhs  May increse to 2  qhs  After 1 week) 180 tablet 1   venlafaxine (EFFEXOR) 37.5 MG tablet Take 37.5 mg by mouth daily.     vitamin B-12 (CYANOCOBALAMIN) 1000 MCG tablet Take 1,000 mcg by mouth daily.     Vitamin D, Ergocalciferol, (DRISDOL) 1.25 MG (50000 UNIT) CAPS capsule Take 50,000 Units by mouth once a week.     Current Facility-Administered  Medications on File Prior to Visit  Medication Dose Route Frequency Provider Last Rate Last Admin   bupivacaine (MARCAINE) 0.5 % (with pres) injection 1 mL  1 mL Infiltration Once Anson Fret, MD       lidocaine (XYLOCAINE) 2 % (with pres) injection 20 mg  1 mL Intradermal Once Anson Fret, MD       methylPREDNISolone acetate (DEPO-MEDROL) injection 80 mg  80 mg Intramuscular Once Anson Fret, MD       Allergies  Allergen Reactions   Abilify [Aripiprazole]     Patient is intolerant   Lantus [Insulin Glargine] Other (See Comments)    Elevated blood sugars   Phenergan [Promethazine] Diarrhea and Nausea And  Vomiting   Reglan [Metoclopramide] Other (See Comments)    Chest pains   Family History  Problem Relation Age of Onset   Hypertension Mother    Bipolar disorder Mother    Cancer Mother    Depression Father    Cancer Father    Thyroid disease Sister    Hypertension Sister    Bipolar disorder Sister    Heart disease Brother    Bipolar disorder Maternal Aunt    Bipolar disorder Daughter    Colon cancer Neg Hx    Colon polyps Neg Hx    Esophageal cancer Neg Hx    Kidney disease Neg Hx    Gallbladder disease Neg Hx    Diabetes Neg Hx    Dementia Neg Hx    Tremor Neg Hx    Seizures Neg Hx    Migraines Neg Hx    Headache Neg Hx     PE: There were no vitals taken for this visit.   Wt Readings from Last 3 Encounters:  09/11/22 179 lb 12.8 oz (81.6 kg)  05/07/22 179 lb 12.8 oz (81.6 kg)  01/09/22 172 lb (78 kg)   Constitutional:  in NAD  The physical exam was not performed (virtual visit).  ASSESSMENT: 1. 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  Component     Latest Ref Rng 07/16/2013  C-Peptide     0.80 - 3.90 ng/mL 1.29  Glucose     70 - 99 mg/dL 161 (H)  Glutamic Acid Decarb Ab     <=1.0 U/mL 11.3 (H)  Pancreatic Islet Cell Antibody     <5 JDF Units 5 (A)  + anti-pancreatic antibodies >> LADA rather than type 2 DM. She still has a positive C peptide >> still has insulin secretion.  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 HL  PLAN:  1.  Patient with poorly controlled LADA, difficult to manage due to depression/anxiety/GI symptoms.  Her sugars usually fluctuate between 30 and 300s.  At last visit sugars were very high, barely touching the normal range, with a more significant increase after dinner.  It looks like the insulin was not very active for her so we discussed about injection techniques (she was using approximately the same places for  injection on her abdomen and I advised her to move these around and try the upper thighs).  She did not feel that her insulin was degraded.  I suggested to switch from Lantus to Toujeo (she ended up starting [place]) and from NovoLog to [place].  I advised her to take Lyumjev right before meals.  I did not suggest a change in doses. -Unfortunately, her insurance did not cover [place]  so we are giving her samples.  However, the sugars improved after the above changes reportedly. CGM interpretation: -At today's visit, we reviewed her CGM downloads: It appears that sugars are still very high: 10% of values are in target range (goal >70%), while 90% are higher than 180 (goal <25%), and 0% are lower than 70 (goal <4%).  The calculated average blood sugar is 255.  The projected HbA1c for the next 3 months ([facility]) is 9.4%. -Reviewing the CGM trends, sugars are elevated at all times of the day, increasing after every meal and with the highest blood sugars approximately between 2 AM and 5 AM.  At today's visit we discussed about increasing the Tresiba dose and, since upon questioning she is still using lower doses of Lyumjev than recommended at last visit, we will also increase the ultra rapid acting insulin before meals.  At today's visit we discussed about the fact that she will need to continue to increase the doses of insulin until she sees that her sugars start improving.  I advised her that she does not need to wait until the visit with me to give her greenlight to increase the doses.  She agrees with the plan.  Will continue metformin for now. - I suggested to:  Patient Instructions  Please continue: - Metformin 500 mg 2x a day with meals  Increase: - Toujeo 50 units in am   - Lyumjev:  12-13 units for a smaller meal 14-16 units for a regular meal 17-18 units for a larger meal - Lyumjev Sliding scale: 201-225: + 2 units 225-250: + 3 units 251-275: + 4 units >275: + 5 units If your sugars are <100  and you eat a low carb meal, inject only ~10 units. So, If sugars are <100 before the meals, you still need to take insulin for that meal. Do not correct sugars <300 at bedtime. If sugars are <70 before a meal, bring the sugar up first, then inject insulin for the meals. Allow the sugars to drop to 80 before you start correcting them.  Please return in 1.5-2 months.  - advised to check sugars at different times of the day - 4x a day, rotating check times - advised for yearly eye exams >> she is UTD - will need a foot exam performed at next visit  - return to clinic in 1.5-2 months  2. PN  -Related to diabetes -She continues nortriptyline and Lyrica; she was on Neurontin in the past but this was tapered off -She is on alpha lipoic acid 600 mg twice a day; also on Nervine Roll On -She had physical therapy for this in the past  3. HL -The latest lipid panel available for review is from 10/2020: LDL above target.  I do not have more recent labs from Dr. Sherwood Gambler.   Lab Results  Component Value Date   CHOL 178 10/17/2020   HDL 45.70 10/17/2020   LDLCALC 100 (H) 10/17/2020   TRIG 165.0 (H) 10/17/2020   CHOLHDL 4 10/17/2020  -She was previously on Crestor 10 mg daily but came off (she did not remember why) but she is currently on Zetia 10 mg daily.  Carlus Pavlov, MD PhD [facility]

## 2022-11-27 NOTE — Patient Instructions (Addendum)
Please continue: - Metformin 500 mg 2x a day with meals  Increase: - Tresiba 50 units in am   - Lyumjev:  12-13 units for a smaller meal 14-16 units for a regular meal 17-18 units for a larger meal - Lyumjev Sliding scale: 201-225: + 2 units 225-250: + 3 units 251-275: + 4 units >275: + 5 units If your sugars are <100 and you eat a low carb meal, inject only ~10 units. So, If sugars are <100 before the meals, you still need to take insulin for that meal. Do not correct sugars <300 at bedtime. If sugars are <70 before a meal, bring the sugar up first, then inject insulin for the meals. Allow the sugars to drop to 80 before you start correcting them.  Please return in 1.5-2 months.

## 2022-12-03 ENCOUNTER — Encounter: Payer: Self-pay | Admitting: Internal Medicine

## 2022-12-06 ENCOUNTER — Telehealth: Payer: Self-pay | Admitting: Internal Medicine

## 2022-12-06 NOTE — Telephone Encounter (Signed)
Patients husband dropped off Patient Assistance paper work off for [employer], paper work given to [employer]

## 2022-12-07 NOTE — Telephone Encounter (Signed)
Application has been faxed back.

## 2022-12-11 DIAGNOSIS — F419 Anxiety disorder, unspecified: Secondary | ICD-10-CM | POA: Diagnosis not present

## 2022-12-11 DIAGNOSIS — B001 Herpesviral vesicular dermatitis: Secondary | ICD-10-CM | POA: Diagnosis not present

## 2022-12-11 DIAGNOSIS — G629 Polyneuropathy, unspecified: Secondary | ICD-10-CM | POA: Diagnosis not present

## 2022-12-11 DIAGNOSIS — I10 Essential (primary) hypertension: Secondary | ICD-10-CM | POA: Diagnosis not present

## 2022-12-12 DIAGNOSIS — F3181 Bipolar II disorder: Secondary | ICD-10-CM | POA: Diagnosis not present

## 2022-12-12 DIAGNOSIS — F411 Generalized anxiety disorder: Secondary | ICD-10-CM | POA: Diagnosis not present

## 2022-12-13 DIAGNOSIS — F3181 Bipolar II disorder: Secondary | ICD-10-CM | POA: Diagnosis not present

## 2022-12-13 DIAGNOSIS — F411 Generalized anxiety disorder: Secondary | ICD-10-CM | POA: Diagnosis not present

## 2022-12-24 DIAGNOSIS — F411 Generalized anxiety disorder: Secondary | ICD-10-CM | POA: Diagnosis not present

## 2022-12-24 DIAGNOSIS — F3181 Bipolar II disorder: Secondary | ICD-10-CM | POA: Diagnosis not present

## 2023-01-01 DIAGNOSIS — F411 Generalized anxiety disorder: Secondary | ICD-10-CM | POA: Diagnosis not present

## 2023-01-01 DIAGNOSIS — F3181 Bipolar II disorder: Secondary | ICD-10-CM | POA: Diagnosis not present

## 2023-01-07 ENCOUNTER — Telehealth: Payer: Self-pay

## 2023-01-07 NOTE — Telephone Encounter (Signed)
 Patient called stating that she has been taking Tresiba  30 units in the morning and 20 at night She states its not working, she is not eating bad.  192 this morning, then the other day it was 124. Her highest was 300 yesterday/ last night. She is unsure what she needs to do.   Please Advise,

## 2023-01-08 NOTE — Telephone Encounter (Signed)
 Pt husband was notified yesterday afternoon, also along with the Status of the patient assistance. He stated to me that he received a letter from [facility] and would call me back to let me know what the letter states.

## 2023-01-09 DIAGNOSIS — N200 Calculus of kidney: Secondary | ICD-10-CM | POA: Diagnosis not present

## 2023-01-09 DIAGNOSIS — L900 Lichen sclerosus et atrophicus: Secondary | ICD-10-CM | POA: Diagnosis not present

## 2023-01-09 DIAGNOSIS — N3020 Other chronic cystitis without hematuria: Secondary | ICD-10-CM | POA: Diagnosis not present

## 2023-01-10 ENCOUNTER — Telehealth: Payer: Self-pay | Admitting: Internal Medicine

## 2023-01-10 NOTE — Telephone Encounter (Signed)
 Patient's spouse brought in a page of the [employer] Patient Assistance Program Application that he was told was not signed by patient.  The form is in Dr. Charlean Sanfilippo folder in the front office.

## 2023-01-16 DIAGNOSIS — B9689 Other specified bacterial agents as the cause of diseases classified elsewhere: Secondary | ICD-10-CM | POA: Diagnosis not present

## 2023-01-16 DIAGNOSIS — L0202 Furuncle of face: Secondary | ICD-10-CM | POA: Diagnosis not present

## 2023-01-16 NOTE — Telephone Encounter (Signed)
 completed

## 2023-01-17 DIAGNOSIS — K5900 Constipation, unspecified: Secondary | ICD-10-CM | POA: Diagnosis not present

## 2023-01-17 DIAGNOSIS — M797 Fibromyalgia: Secondary | ICD-10-CM | POA: Diagnosis not present

## 2023-01-17 DIAGNOSIS — M47812 Spondylosis without myelopathy or radiculopathy, cervical region: Secondary | ICD-10-CM | POA: Diagnosis not present

## 2023-01-17 DIAGNOSIS — G894 Chronic pain syndrome: Secondary | ICD-10-CM | POA: Diagnosis not present

## 2023-01-23 DIAGNOSIS — F3181 Bipolar II disorder: Secondary | ICD-10-CM | POA: Diagnosis not present

## 2023-01-23 DIAGNOSIS — F411 Generalized anxiety disorder: Secondary | ICD-10-CM | POA: Diagnosis not present

## 2023-01-26 ENCOUNTER — Other Ambulatory Visit: Payer: Self-pay

## 2023-01-26 ENCOUNTER — Encounter (HOSPITAL_COMMUNITY): Payer: Self-pay

## 2023-01-26 ENCOUNTER — Emergency Department (HOSPITAL_COMMUNITY)
Admission: EM | Admit: 2023-01-26 | Discharge: 2023-01-26 | Disposition: A | Discharge status: Home / Self Care | Payer: Medicare Other | Attending: Emergency Medicine | Admitting: Emergency Medicine

## 2023-01-26 ENCOUNTER — Other Ambulatory Visit (HOSPITAL_COMMUNITY): Payer: Self-pay

## 2023-01-26 DIAGNOSIS — F419 Anxiety disorder, unspecified: Secondary | ICD-10-CM | POA: Diagnosis not present

## 2023-01-26 DIAGNOSIS — R739 Hyperglycemia, unspecified: Secondary | ICD-10-CM | POA: Diagnosis not present

## 2023-01-26 DIAGNOSIS — Z7982 Long term (current) use of aspirin: Secondary | ICD-10-CM | POA: Diagnosis not present

## 2023-01-26 DIAGNOSIS — Z794 Long term (current) use of insulin: Secondary | ICD-10-CM | POA: Insufficient documentation

## 2023-01-26 DIAGNOSIS — E1165 Type 2 diabetes mellitus with hyperglycemia: Secondary | ICD-10-CM | POA: Diagnosis not present

## 2023-01-26 DIAGNOSIS — R000 Tachycardia, unspecified: Secondary | ICD-10-CM | POA: Diagnosis not present

## 2023-01-26 LAB — BASIC METABOLIC PANEL
Anion gap: 12 (ref 5–15)
BUN: 30 mg/dL — ABNORMAL HIGH (ref 8–23)
CO2: 22 mmol/L (ref 22–32)
Calcium: 9.5 mg/dL (ref 8.9–10.3)
Chloride: 97 mmol/L — ABNORMAL LOW (ref 98–111)
Creatinine, Ser: 0.8 mg/dL (ref 0.44–1.00)
GFR, Estimated: >60 mL/min (ref >60)
Glucose, Bld: 438 mg/dL — ABNORMAL HIGH (ref 70–99)
Potassium: 5.5 mmol/L — ABNORMAL HIGH (ref 3.5–5.1)
Sodium: 131 mmol/L — ABNORMAL LOW (ref 135–145)

## 2023-01-26 LAB — CBC
HCT: 38.6 % (ref 36.0–46.0)
Hemoglobin: 12.6 g/dL (ref 12.0–15.0)
MCH: 28.8 pg (ref 26.0–34.0)
MCHC: 32.6 g/dL (ref 30.0–36.0)
MCV: 88.3 fL (ref 80.0–100.0)
Platelets: 182 10*3/uL (ref 150–400)
RBC: 4.37 MIL/uL (ref 3.87–5.11)
RDW: 14.8 % (ref 11.5–15.5)
WBC: 6.1 10*3/uL (ref 4.0–10.5)
nRBC: 0 % (ref 0.0–0.2)

## 2023-01-26 LAB — URINALYSIS, ROUTINE W REFLEX MICROSCOPIC
Bacteria, UA: NONE SEEN
Bilirubin Urine: NEGATIVE
Glucose, UA: >=500 mg/dL — AB
Hgb urine dipstick: NEGATIVE
Ketones, ur: 20 mg/dL — AB
Leukocytes,Ua: NEGATIVE
Nitrite: NEGATIVE
Protein, ur: NEGATIVE mg/dL
Specific Gravity, Urine: 1.035 — ABNORMAL HIGH (ref 1.005–1.030)
pH: 5 (ref 5.0–8.0)

## 2023-01-26 LAB — I-STAT CHEM 8, ED
BUN: 30 mg/dL — ABNORMAL HIGH (ref 8–23)
BUN: 34 mg/dL — ABNORMAL HIGH (ref 8–23)
Calcium, Ion: 1.13 mmol/L — ABNORMAL LOW (ref 1.15–1.40)
Calcium, Ion: 1.03 mmol/L — ABNORMAL LOW (ref 1.15–1.40)
Chloride: 97 mmol/L — ABNORMAL LOW (ref 98–111)
Chloride: 99 mmol/L (ref 98–111)
Creatinine, Ser: 0.7 mg/dL (ref 0.44–1.00)
Creatinine, Ser: 0.7 mg/dL (ref 0.44–1.00)
Glucose, Bld: 472 mg/dL — ABNORMAL HIGH (ref 70–99)
Glucose, Bld: 433 mg/dL — ABNORMAL HIGH (ref 70–99)
HCT: 40 % (ref 36.0–46.0)
HCT: 42 % (ref 36.0–46.0)
Hemoglobin: 13.6 g/dL (ref 12.0–15.0)
Hemoglobin: 14.3 g/dL (ref 12.0–15.0)
Potassium: 4.8 mmol/L (ref 3.5–5.1)
Potassium: 5.3 mmol/L — ABNORMAL HIGH (ref 3.5–5.1)
Sodium: 130 mmol/L — ABNORMAL LOW (ref 135–145)
Sodium: 130 mmol/L — ABNORMAL LOW (ref 135–145)
TCO2: 21 mmol/L — ABNORMAL LOW (ref 22–32)
TCO2: 22 mmol/L (ref 22–32)

## 2023-01-26 LAB — CBG MONITORING, ED
Glucose-Capillary: 494 mg/dL — ABNORMAL HIGH (ref 70–99)
Glucose-Capillary: 302 mg/dL — ABNORMAL HIGH (ref 70–99)
Glucose-Capillary: 359 mg/dL — ABNORMAL HIGH (ref 70–99)

## 2023-01-26 MED ORDER — HYDROXYZINE HCL 25 MG PO TABS
50.0000 mg | ORAL_TABLET | Freq: Three times a day (TID) | ORAL | 0 refills | Status: AC | PRN
Start: 2023-01-26 — End: ?
  Filled 2023-01-26: qty 18, 3d supply, fill #0

## 2023-01-26 MED ORDER — HYDROXYZINE HCL 25 MG PO TABS
50.0000 mg | ORAL_TABLET | Freq: Once | ORAL | Status: AC
  Administered 2023-01-26: 50 mg via ORAL
  Filled 2023-01-26: qty 2

## 2023-01-26 MED ORDER — SODIUM ZIRCONIUM CYCLOSILICATE 10 G PO PACK
10.0000 g | PACK | Freq: Once | ORAL | Status: AC
  Administered 2023-01-26: 10 g via ORAL
  Filled 2023-01-26: qty 1

## 2023-01-26 MED ORDER — INSULIN ASPART 100 UNIT/ML IJ SOLN
10.0000 [IU] | Freq: Once | INTRAMUSCULAR | Status: AC
  Administered 2023-01-26: 10 [IU] via INTRAVENOUS

## 2023-01-26 MED ORDER — SODIUM CHLORIDE 0.9 % IV BOLUS
1000.0000 mL | Freq: Once | INTRAVENOUS | Status: AC
  Administered 2023-01-26: 1000 mL via INTRAVENOUS

## 2023-01-26 NOTE — ED Notes (Addendum)
Pt presents with labile glucose over the last few months and reports her CGM has been reading "high." Pt's DM RN referred her to the ED today. Today pt has polydipsia, polyuria, and increased brain fog. Pt DM2 is treated with metformin, Evaristo Bury, and Lyumjva and pt has been compliant with treatment.

## 2023-01-26 NOTE — ED Notes (Signed)
Paged hospitalist for Dr. Rhunette Croft

## 2023-01-26 NOTE — ED Provider Triage Note (Signed)
Emergency Medicine Provider Triage Evaluation Note  [name] , a 68 y.o. female  was evaluated in triage.  Pt complains of 2 days of increased BG, called endocrinologist and was told to come to ER. 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 Recently changed insulin Tx 3 months ago adding insulin lispro. Has not been taking insulin glargine. Still able to ambulate but feels unsteady.  Endorses polyuria, polydipsia Denies fever, weakness, blurry vision, vertigo, chest pain, dyspnea, abdominal pain, abnormal stool, hematochezia, melena, dysuria, hematuria.   Review of Systems  Positive: See above Negative: See above  Physical Exam  BP (!) 158/82 (BP Location: Left Arm)   Pulse (!) 114   Temp 97.6 F (36.4 C)   Resp 18   Ht 5\' 4"  (1.626 m)   Wt 81.6 kg   SpO2 99%   BMI 30.90 kg/m  Gen:   Awake, no distress   Resp:  Normal effort  MSK:   Moves extremities without difficulty  Other:    Medical Decision Making  Medically screening exam initiated at 11:30 AM.  Appropriate orders placed.  Paula Compton was informed that the remainder of the evaluation will be completed by another provider, this initial triage assessment does not replace that evaluation, and the importance of remaining in the ED until their evaluation is complete.     Lunette Stands, [place] 01/26/23 1138

## 2023-01-26 NOTE — ED Provider Notes (Signed)
[place] EMERGENCY DEPARTMENT AT [facility] Provider Note   CSN: [id number] Arrival date & time: 01/26/23  1113     History  Chief Complaint  Patient presents with   Hyperglycemia    [name] is a 68 y.o. female.  HPI    Pt comes in with cc of elevated blood sugar and anxiety.  Patient has history of bipolar disorder, anxiety, chronic pain syndrome, L ADA managed with metformin and insulin.  Patient accompanied by her husband.  According to the patient and the husband, patient's blood sugar have been running over 400 consistently over the last 2 days.  She has been taking her medications as prescribed.  She has not changed her diet.  She has not had any infections/fevers and they had called the endocrinologist office, and they advised that she come to the ER.  Patient does admit to increased urination, increased thirst.  Patient has had similar symptoms in the past including in September or October when she had come to the ER.  She last saw her endocrinologist within the last 2 months and no medication changes were made.  Home Medications Prior to Admission medications   Medication Sig Start Date End Date Taking? Authorizing Provider  hydrOXYzine (ATARAX) 25 MG tablet Take 2 tablets (50 mg total) by mouth every 8 (eight) hours as needed. 01/26/23  Yes Derwood Kaplan, MD  acetaminophen (TYLENOL) 500 MG tablet Take 1,000 mg by mouth every 6 (six) hours as needed for mild pain or headache.    [provider]  ALPRAZolam Prudy Feeler) 1 MG tablet Take 1 mg by mouth 4 (four) times daily as needed for anxiety.  08/10/18   [provider]  AMBULATORY NON FORMULARY MEDICATION Squatty Potty x 1 03/07/15   Nandigam, Eleonore Chiquito, MD  amitriptyline (ELAVIL) 25 MG tablet Take 25 mg by mouth at bedtime. 08/16/21   [provider]  amLODipine (NORVASC) 5 MG tablet Take 5 mg by mouth daily. 02/19/19   [provider]  aspirin EC 81 MG tablet Take 1 tablet (81  mg total) by mouth daily. Swallow whole. 02/27/21   Anson Fret, MD  BD PEN NEEDLE NANO U/F 32G X 4 MM MISC USE WITH INSULIN PEN FOUR TIMES A DAY 05/21/22   Carlus Pavlov, MD  BENICAR 40 MG tablet Take 40 mg by mouth daily. 02/02/15   [provider]  busPIRone (BUSPAR) 10 MG tablet Take 10 mg by mouth in the morning and at bedtime. 02/23/20   [provider]  Butalbital-APAP-Caffeine 50-325-40 MG capsule Take 1 capsule by mouth 4 (four) times daily as needed. 10/30/21   [provider]  cholecalciferol (VITAMIN D3) 25 MCG (1000 UNIT) tablet Take 1,000 Units by mouth daily.    [provider]  Continuous Blood Gluc Receiver (DEXCOM G6 RECEIVER) DEVI Use as instructed to check blood sugar. 05/16/21   Carlus Pavlov, MD  Continuous Glucose Sensor (DEXCOM G6 SENSOR) MISC Use as instructed to check blood sugar change every 10 days 05/07/22   Carlus Pavlov, MD  Continuous Glucose Transmitter (DEXCOM G6 TRANSMITTER) MISC Use as instructed to check blood sugar. Change every 90 days 05/07/22   Carlus Pavlov, MD  Docusate Sodium (COLACE PO) Take 100 mg by mouth daily.    [provider]  esomeprazole (NEXIUM) 40 MG capsule Take 40 mg by mouth 2 (two) times daily. 01/20/20   [provider]  ezetimibe (ZETIA) 10 MG tablet Take 10 mg by mouth  daily.    [provider]  Glucagon (BAQSIMI ONE PACK) 3 MG/DOSE POWD Use 1 spray in the nose as needed for hypoglycemia 10/17/20   Carlus Pavlov, MD  glucose blood (FREESTYLE LITE) test strip USE TO CHECK BLOOD SUGAR 6 TIMES PER DAY 05/21/22   Carlus Pavlov, MD  insulin degludec (TRESIBA FLEXTOUCH) 200 UNIT/ML FlexTouch Pen Inject 44-48 Units into the skin in the morning. 09/13/22   Carlus Pavlov, MD  insulin glargine, 2 Unit Dial, (TOUJEO MAX SOLOSTAR) 300 UNIT/ML Solostar Pen Inject 44-48 units under skin in am 09/11/22   Carlus Pavlov, MD  Insulin Lispro-aabc (LYUMJEV KWIKPEN) 200  UNIT/ML KwikPen Inject 13-18 Units into the skin 3 (three) times daily before meals. 10/15/22   Carlus Pavlov, MD  Insulin Syringes, Disposable, U-100 1 ML MISC To use for insulin injection.. 09/23/15   Carlus Pavlov, MD  Lancets (FREESTYLE) lancets USE TO TEST BLOOD SUGAR 4 TO 6 TIMES DAILY AS INSTRUCTED 09/14/19   Carlus Pavlov, MD  losartan (COZAAR) 25 MG tablet Take 25 mg by mouth daily. 02/23/20   [provider]  metFORMIN (GLUCOPHAGE) 500 MG tablet TAKE 1 TABLET TWICE A DAY WITH MEALS 09/14/22   Carlus Pavlov, MD  methocarbamol (ROBAXIN) 500 MG tablet Take 500 mg by mouth 4 (four) times daily as needed. 10/30/21   [provider]  nortriptyline (PAMELOR) 50 MG capsule Take 50 mg by mouth at bedtime.  02/17/19   [provider]  ondansetron (ZOFRAN-ODT) 4 MG disintegrating tablet PLACE 1 TO 2 TABLETS ON TONGUE EVERY 4 TO 6 HOURS AS NEEDED FOR NAUSEA Patient taking differently: Take 4-8 mg by mouth every 4 (four) hours as needed for nausea or vomiting. 12/15/18   Nandigam, Eleonore Chiquito, MD  OSCIMIN 0.125 MG SUBL DISSOLVE 1 TABLET UNDER THE TONGUE EVERY 4 HOURS AS NEEDED (NEED APPOINTMENT) 09/11/19   Napoleon Form, MD  pregabalin (LYRICA) 50 MG capsule START WITH TWICE DAILY. IF NO SIDE EFFECTS IN 1 WEEK MAY INCREASE TO 3X DAILY.*STOP GABAPENTIN* 03/05/22   Anson Fret, MD  Probiotic CAPS Take 1 capsule by mouth daily. 02/25/18   Ward, Layla Maw, DO  Propylene Glycol (SYSTANE BALANCE OP) Place 1 drop into both eyes daily as needed (dry eyes).    [provider]  traZODone (DESYREL) 100 MG tablet 1  qhs  May increse to 2  qhs  After 1 week Patient taking differently: Take 100 mg by mouth at bedtime. 1  qhs  May increse to 2  qhs  After 1 week 04/10/18   Archer Asa, MD  venlafaxine (EFFEXOR) 37.5 MG tablet Take 37.5 mg by mouth daily. 01/22/20   [provider]  vitamin B-12 (CYANOCOBALAMIN) 1000 MCG tablet Take 1,000 mcg by mouth daily.     [provider]  Vitamin D, Ergocalciferol, (DRISDOL) 1.25 MG (50000 UNIT) CAPS capsule Take 50,000 Units by mouth once a week. 05/03/22   [provider]      Allergies    Abilify [aripiprazole], Lantus [insulin glargine], Phenergan [promethazine], and Reglan [metoclopramide]    Review of Systems   Review of Systems  All other systems reviewed and are negative.   Physical Exam Updated Vital Signs BP 130/68   Pulse 95   Temp 97.6 F (36.4 C)   Resp 17   Ht 5\' 4"  (1.626 m)   Wt 81.6 kg   SpO2 99%   BMI 30.90 kg/m  Physical Exam Vitals and nursing note reviewed.  Constitutional:  Appearance: She is well-developed.  HENT:     Head: Atraumatic.  Eyes:     Extraocular Movements: Extraocular movements intact.     Pupils: Pupils are equal, round, and reactive to light.  Cardiovascular:     Rate and Rhythm: Normal rate.  Pulmonary:     Effort: Pulmonary effort is normal.  Musculoskeletal:     Cervical back: Normal range of motion and neck supple.  Skin:    General: Skin is warm and dry.  Neurological:     Mental Status: She is alert and oriented to person, place, and time.     ED Results / Procedures / Treatments   Labs (all labs ordered are listed, but only abnormal results are displayed) Labs Reviewed  URINALYSIS, ROUTINE W REFLEX MICROSCOPIC - Abnormal; Notable for the following components:      Result Value   Specific Gravity, Urine 1.035 (*)    Glucose, UA >=500 (*)    Ketones, ur 20 (*)    All other components within normal limits  BASIC METABOLIC PANEL - Abnormal; Notable for the following components:   Sodium 131 (*)    Potassium 5.5 (*)    Chloride 97 (*)    Glucose, Bld 438 (*)    BUN 30 (*)    All other components within normal limits  CBG MONITORING, ED - Abnormal; Notable for the following components:   Glucose-Capillary 494 (*)    All other components within normal limits  CBG MONITORING, ED - Abnormal; Notable for the  following components:   Glucose-Capillary 359 (*)    All other components within normal limits  I-STAT CHEM 8, ED - Abnormal; Notable for the following components:   Sodium 130 (*)    Chloride 97 (*)    BUN 30 (*)    Glucose, Bld 472 (*)    Calcium, Ion 1.13 (*)    TCO2 21 (*)    All other components within normal limits  I-STAT CHEM 8, ED - Abnormal; Notable for the following components:   Sodium 130 (*)    Potassium 5.3 (*)    BUN 34 (*)    Glucose, Bld 433 (*)    Calcium, Ion 1.03 (*)    All other components within normal limits  CBG MONITORING, ED - Abnormal; Notable for the following components:   Glucose-Capillary 302 (*)    All other components within normal limits  CBC  I-STAT VENOUS BLOOD GAS, ED  CBG MONITORING, ED    EKG EKG Interpretation Date/Time:  Saturday January 26 2023 12:44:34 EST Ventricular Rate:  103 PR Interval:  176 QRS Duration:  88 QT Interval:  340 QTC Calculation: 445 R Axis:   8  Text Interpretation: Sinus tachycardia Consider anterior infarct No acute changes No significant change since last tracing Confirmed by Derwood Kaplan [phone]) on 01/26/2023 3:27:52 PM  Radiology No results found.  Procedures Procedures    Medications Ordered in ED Medications  sodium zirconium cyclosilicate (LOKELMA) packet 10 g (has no administration in time range)  hydrOXYzine (ATARAX) tablet 50 mg (has no administration in time range)  insulin aspart (novoLOG) injection 10 Units (10 Units Intravenous Given 01/26/23 1443)  sodium chloride 0.9 % bolus 1,000 mL (0 mLs Intravenous Stopped 01/26/23 1522)    ED Course/ Medical Decision Making/ A&P  Medical Decision Making Amount and/or Complexity of Data Reviewed Labs: ordered.  Risk Prescription drug management.   This patient presents to the ED with chief complaint(s) of elevated blood sugar and anxiety with pertinent past medical history of L ADA, anxiety,  depression.The complaint involves an extensive differential diagnosis and also carries with it a high risk of complications and morbidity.    The differential diagnosis includes : HHS, DKA, severe dehydration, electrolyte abnormality, renal failure, medication noncompliance, anxiety attack.  The initial plan is to get basic labs.  Our goal will be to rule out HHS and DKA.  It appears that patient's Xanax was discontinued because she needed to follow-up with pain specialist.  Patient is supposed to follow-up with pain specialist and psychiatrist next week.  At this time she does not have a psychiatric breakdown/emergency, but it does appear that the anxiety state is affecting her.  We will start her on hydroxyzine and advise coordination between her pain specialist and psychiatrist to get her anxiety well-controlled.  We will not start her on Xanax to prevent her getting fired by the pain specialist.   Additional history obtained: Additional history obtained from spouse Records reviewed  records of the endocrinologist, I discussed with the patient her current regimen and went through the plan already in place by Dr. Elvera Lennox.  Independent labs interpretation:  The following labs were independently interpreted: Elevated blood sugar without anion gap.  There is evidence of mild dehydration and slight elevation in potassium.   Treatment and Reassessment: IV fluids given.  IV insulin 10 units given.  Blood sugar improved to 300.  Patient does not have HHS. She has agreed to start hydroxyzine.  Consultation: - Consulted or discussed management/test interpretation with external professional: -Endocrinologist.  I was unable to get in touch with an endocrinologist.  I have sent an epic message to Dr. Elvera Lennox.  Final Clinical Impression(s) / ED Diagnoses Final diagnoses:  Anxiety  Hyperglycemia    Rx / DC Orders ED Discharge Orders          Ordered    hydrOXYzine (ATARAX) 25 MG tablet  Every 8  hours PRN        01/26/23 1525              Derwood Kaplan, MD 01/26/23 1645

## 2023-01-26 NOTE — Discharge Instructions (Addendum)
Please call Dr. Charlean Sanfilippo office on Monday, we have sent a message to her as well, for a close follow up.  Please discuss your worsening anxiety with the pain medicine doctor and get clearance to have psychiatrist manage it.   Continue to take your medicines as prescribed.

## 2023-01-28 ENCOUNTER — Encounter: Payer: Self-pay | Admitting: Internal Medicine

## 2023-01-28 ENCOUNTER — Ambulatory Visit (INDEPENDENT_AMBULATORY_CARE_PROVIDER_SITE_OTHER): Payer: Medicare Other | Admitting: Internal Medicine

## 2023-01-28 ENCOUNTER — Telehealth: Payer: Self-pay

## 2023-01-28 DIAGNOSIS — E785 Hyperlipidemia, unspecified: Secondary | ICD-10-CM

## 2023-01-28 DIAGNOSIS — E139 Other specified diabetes mellitus without complications: Secondary | ICD-10-CM

## 2023-01-28 DIAGNOSIS — E1042 Type 1 diabetes mellitus with diabetic polyneuropathy: Secondary | ICD-10-CM | POA: Diagnosis not present

## 2023-01-28 DIAGNOSIS — G63 Polyneuropathy in diseases classified elsewhere: Secondary | ICD-10-CM

## 2023-01-28 LAB — POCT GLYCOSYLATED HEMOGLOBIN (HGB A1C): Hemoglobin A1C: 8.5 % — AB (ref 4.0–5.6)

## 2023-01-28 MED ORDER — TOUJEO SOLOSTAR 300 UNIT/ML ~~LOC~~ SOPN: 50.0000 [IU] | PEN_INJECTOR | Freq: Every day | SUBCUTANEOUS | 3 refills | Status: DC

## 2023-01-28 NOTE — Patient Instructions (Addendum)
Please continue: - Metformin 500 mg 2x a day with meals  Use: - Toujeo/Tresiba 50 units in am   - Lyumjev:  12 units for a smaller meal 14 units for a regular meal 16 units for a larger meal - Lyumjev Sliding scale: 201-225: + 2 units 225-250: + 3 units 251-275: + 4 units >275: + 5 units If your sugars are <100 and you eat a low carb meal, inject only ~10 units. So, If sugars are <100 before the meals, you still need to take insulin for that meal. Do not correct sugars <300 at bedtime. If sugars are <70 before a meal, bring the sugar up first, then inject insulin for the meals. Allow the sugars to drop to 80 before you start correcting them.  ED physician: Derwood Kaplan, MD.  Please return in 1.5 months.

## 2023-01-28 NOTE — Progress Notes (Signed)
Patient ID: [name], female   DOB: 06-06-1955, 68 y.o.   MRN: [id number]  HPI: [name] is a 68 y.o.-year-old female, initially referred by her PCP, Dr. Assunta Found (PA: Lenise Herald), returning for f/u for LADA, dx 2005, insulin-dependent since ~2006, controlled, with complications (PN, gastroparesis?).  Last visit 2 months ago.    Interim history: She has bipolar disease and also has short-term memory loss, tension headaches, disequilibrium for which she sees neurology.  She also has anxiety and sees a Veterinary surgeon.   She has a lot of headaches, posterior neck pain, and immobility.  She was referred to neurology/neurosurgery.  She could not be evaluated until she stopped Xanax. She presented to the emergency room 2 days ago (01/26/2023) for high blood sugars, in the 400s, after having stopped Xanax and in the setting of high anxiety.  DKA and HHS were ruled out.  She was given insulin and sugars decreased to the 300s.  She was started on hydroxyzine to help with anxiety and advised her to schedule a follow up appointment here in the endocrinology clinic.  She feels better after starting hydroxyzine.  She also has an appointment with her psychiatrist tomorrow.  Reviewed history: She was on an Omnipod insulin pump on 08/01/2015. She had severe anxiety and depression and got so overwhelmed with the insulin pump that we had to take her off the pump.  We switched her to a basal-bolus insulin regimen, but she was unable to calculate the insulin doses based on insulin to carb ratio, so now she is using fixed rapid acting insulin doses.  Reviewed HbA1c levels: Lab Results  Component Value Date   HGBA1C 8.0 (A) 09/11/2022   HGBA1C 7.2 (A) 05/07/2022   HGBA1C 5.0 03/03/2020  05/07/2022: HbA1c calculated from fructosamine is 8.4%, slightly lower than before. 08/25/2021: HbA1c calculated from fructosamine is 8.6%, higher than before.  She is on: - Metformin 1000 mg with dinner >> 500 mg 2x a  day - Lantus 20 units in am and 25 >> 28 units at night >> Tresiba  46 units daily >> Toujeo/Tresiba 50 units daily >> taking 30 units in am and 20 units at night - NovoLog: >> Lyumjev 12-13 units for a smaller meal 14-16 units for a regular meal 17-18 units for a larger meal - Novolog Sliding scale: >> Lyumjev 201-225: + 2 units 225-250: + 3 units 251-275: + 4 units >275: + 5 units If your sugars are <100 and you eat a low carb meal, inject only ~6 units. So, If sugars are <100 before the meals, you still need to take insulin for that meal. Do not correct sugars <300 at bedtime. If sugars are <70 before a meal, bring the sugar up first, then inject insulin for the meals.  Pt checks her sugars >4x a day with her Dexcom CGM:     Previously:     Lowest sugar was 32 >>... 40 >> ... 55 >> >100 >> 80; she has hypoglycemia awareness in the 50s Highest sugar was 358 >> 300s >> 494 >> 393 >> HI. She was in the emergency room in 09/2022  after having had blood sugar of 494.  She was given IV insulin and hydrated and discharged.  No signs of DKA. At today's visit, CBG 432.  They mention that she started to have high blood sugars approximately 3 days ago.  She does mention several panic attacks in this period.  No CKD, last BUN/creatinine:  Lab Results  Component Value Date   BUN 34 (H) 01/26/2023   CREATININE 0.70 01/26/2023   No microalbuminuria: Lab Results  Component Value Date   MICRALBCREAT 5.4 11/07/2016    + Dyslipidemia: Lab Results  Component Value Date   CHOL 178 10/17/2020   HDL 45.70 10/17/2020   LDLCALC 100 (H) 10/17/2020   TRIG 165.0 (H) 10/17/2020   CHOLHDL 4 10/17/2020  She was started on Crestor 10 mg daily - but taken off, on Ezetimibe now.  -Last eye exam was 07/2022: No DR reportedly, but dry eyes >> Dr Dione Booze. Had cataract sx's.  -+ Numbness and tingling in her feet.  Also, occasional pain.  She was on Neurontin, but now only on nortriptyline.  These are  refilled by PCP.  She is also on alpha-lipoic acid.  She sees a podiatrist [facility]).  She had 2 toenails removed in the past due to fungal infection.  Last foot exam 08/25/2021.  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  She has a diagnosis of Parkinson's disease.  She also has bipolar disease. Also: GERD, HTN, fibromyalgia.  Latest TSH was normal: 07/31/2022: TSH 1.13 Lab Results  Component Value Date   TSH 0.533 03/03/2020   ROS: + See HPI  I reviewed pt's medications, allergies, PMH, social hx, family hx, and changes were documented in the history of present illness. Otherwise, unchanged from my initial visit note.  Past Medical History:  Diagnosis Date   Anxiety    Bipolar 1 disorder (HCC)    Breast density 06/25/2012   Right breast density, will get mammogram and [place]   Depression    Diabetes mellitus without complication (HCC)    type 1   Duodenitis    Fibromyalgia    GERD (gastroesophageal reflux disease)    History of kidney stones    HTN (hypertension)    Hyperlipidemia    IBS (irritable bowel syndrome)    Neuropathy    Sepsis (HCC) 02/27/2019   Sepsis (HCC) 2023   Past Surgical History:  Procedure Laterality Date   ABDOMINAL HYSTERECTOMY     partial   CHOLECYSTECTOMY     COLONOSCOPY     CYSTOSCOPY WITH RETROGRADE PYELOGRAM, URETEROSCOPY AND STENT PLACEMENT Right 03/31/2019   Procedure: CYSTOSCOPY WITH RETROGRADE PYELOGRAM, URETEROSCOPY AND STENT PLACEMENT;  Surgeon: Noel Christmas, MD;  Location: [facility];  Service: Urology;  Laterality: Right;   CYSTOSCOPY WITH STENT PLACEMENT Right 03/02/2019   Procedure: CYSTOSCOPY WITH STENT PLACEMENT;  Surgeon: Noel Christmas, MD;  Location: [facility];  Service: Urology;  Laterality: Right;   HOLMIUM LASER APPLICATION Right 03/31/2019   Procedure: HOLMIUM LASER APPLICATION;  Surgeon: Noel Christmas, MD;  Location: [facility];  Service: Urology;  Laterality: Right;    UPPER GASTROINTESTINAL ENDOSCOPY     Social History   Socioeconomic History   Marital status: Married    Spouse name: phillip   Number of children: 1   Years of education: 12   Highest education level: High school graduate  Occupational History   Occupation: Disabled  Tobacco Use   Smoking status: Never   Smokeless tobacco: Never  Vaping Use   Vaping status: Some Days   Substances: Flavoring  Substance and Sexual Activity   Alcohol use: No    Alcohol/week: 0.0 standard drinks of alcohol   Drug use: No   Sexual activity: Not Currently  Other Topics Concern   Not on file  Social History Narrative  Lives at home w/ her husband   Left-handed   Caffeine: 1-2 cups of coffee daily   Social Drivers of Health   Financial Resource Strain: Low Risk  (12/07/2016)   Overall Financial Resource Strain (CARDIA)    Difficulty of Paying Living Expenses: Not hard at all  Food Insecurity: No Food Insecurity (12/07/2016)   Hunger Vital Sign    Worried About Running Out of Food in the Last Year: Never true    Ran Out of Food in the Last Year: Never true  Transportation Needs: Unmet Transportation Needs (12/07/2016)   PRAPARE - Administrator, Civil Service (Medical): Yes    Lack of Transportation (Non-Medical): Yes  Physical Activity: Inactive (12/07/2016)   Exercise Vital Sign    Days of Exercise per Week: 0 days    Minutes of Exercise per Session: 0 min  Stress: Not on file  Social Connections: Somewhat Isolated (12/07/2016)   Social Connection and Isolation Panel [NHANES]    Frequency of Communication with Friends and Family: Never    Frequency of Social Gatherings with Friends and Family: Never    Attends Religious Services: More than 4 times per year    Active Member of [employer] or Organizations: No    Attends Banker Meetings: Never    Marital Status: Married  Catering manager Violence: Not At Risk (12/07/2016)   Humiliation, Afraid, Rape, and Kick  questionnaire    Fear of Current or Ex-Partner: No    Emotionally Abused: No    Physically Abused: No    Sexually Abused: No   Current Outpatient Medications on File Prior to Visit  Medication Sig Dispense Refill   acetaminophen (TYLENOL) 500 MG tablet Take 1,000 mg by mouth every 6 (six) hours as needed for mild pain or headache.     ALPRAZolam (XANAX) 1 MG tablet Take 1 mg by mouth 4 (four) times daily as needed for anxiety.      AMBULATORY NON FORMULARY MEDICATION Squatty Potty x 1 1 each 0   amitriptyline (ELAVIL) 25 MG tablet Take 25 mg by mouth at bedtime.     amLODipine (NORVASC) 5 MG tablet Take 5 mg by mouth daily.     aspirin EC 81 MG tablet Take 1 tablet (81 mg total) by mouth daily. Swallow whole. 30 tablet 11   BD PEN NEEDLE NANO U/F 32G X 4 MM MISC USE WITH INSULIN PEN FOUR TIMES A DAY 400 each 3   BENICAR 40 MG tablet Take 40 mg by mouth daily.     busPIRone (BUSPAR) 10 MG tablet Take 10 mg by mouth in the morning and at bedtime.     Butalbital-APAP-Caffeine 50-325-40 MG capsule Take 1 capsule by mouth 4 (four) times daily as needed.     cholecalciferol (VITAMIN D3) 25 MCG (1000 UNIT) tablet Take 1,000 Units by mouth daily.     Continuous Blood Gluc Receiver (DEXCOM G6 RECEIVER) DEVI Use as instructed to check blood sugar. 1 each 0   Continuous Glucose Sensor (DEXCOM G6 SENSOR) MISC Use as instructed to check blood sugar change every 10 days 9 each 3   Continuous Glucose Transmitter (DEXCOM G6 TRANSMITTER) MISC Use as instructed to check blood sugar. Change every 90 days 1 each 3   Docusate Sodium (COLACE PO) Take 100 mg by mouth daily.     esomeprazole (NEXIUM) 40 MG capsule Take 40 mg by mouth 2 (two) times daily.     ezetimibe (ZETIA) 10 MG tablet Take  10 mg by mouth daily.     Glucagon (BAQSIMI ONE PACK) 3 MG/DOSE POWD Use 1 spray in the nose as needed for hypoglycemia 1 each PRN   glucose blood (FREESTYLE LITE) test strip USE TO CHECK BLOOD SUGAR 6 TIMES PER DAY 600 each  3   hydrOXYzine (ATARAX) 25 MG tablet Take 2 tablets (50 mg total) by mouth every 8 (eight) hours as needed. 18 tablet 0   insulin degludec (TRESIBA FLEXTOUCH) 200 UNIT/ML FlexTouch Pen Inject 44-48 Units into the skin in the morning. 87 mL 2   insulin glargine, 2 Unit Dial, (TOUJEO MAX SOLOSTAR) 300 UNIT/ML Solostar Pen Inject 44-48 units under skin in am 18 mL 3   Insulin Lispro-aabc (LYUMJEV KWIKPEN) 200 UNIT/ML KwikPen Inject 13-18 Units into the skin 3 (three) times daily before meals. 18 mL 3   Insulin Syringes, Disposable, U-100 1 ML MISC To use for insulin injection.. 100 each 1   Lancets (FREESTYLE) lancets USE TO TEST BLOOD SUGAR 4 TO 6 TIMES DAILY AS INSTRUCTED 300 each 6   losartan (COZAAR) 25 MG tablet Take 25 mg by mouth daily.     metFORMIN (GLUCOPHAGE) 500 MG tablet TAKE 1 TABLET TWICE A DAY WITH MEALS 180 tablet 3   methocarbamol (ROBAXIN) 500 MG tablet Take 500 mg by mouth 4 (four) times daily as needed.     nortriptyline (PAMELOR) 50 MG capsule Take 50 mg by mouth at bedtime.      ondansetron (ZOFRAN-ODT) 4 MG disintegrating tablet PLACE 1 TO 2 TABLETS ON TONGUE EVERY 4 TO 6 HOURS AS NEEDED FOR NAUSEA (Patient taking differently: Take 4-8 mg by mouth every 4 (four) hours as needed for nausea or vomiting.) 180 tablet 4   OSCIMIN 0.125 MG SUBL DISSOLVE 1 TABLET UNDER THE TONGUE EVERY 4 HOURS AS NEEDED (NEED APPOINTMENT) 360 tablet 0   pregabalin (LYRICA) 50 MG capsule START WITH TWICE DAILY. IF NO SIDE EFFECTS IN 1 WEEK MAY INCREASE TO 3X DAILY.*STOP GABAPENTIN* 90 capsule 2   Probiotic CAPS Take 1 capsule by mouth daily. 30 capsule 1   Propylene Glycol (SYSTANE BALANCE OP) Place 1 drop into both eyes daily as needed (dry eyes).     traZODone (DESYREL) 100 MG tablet 1  qhs  May increse to 2  qhs  After 1 week (Patient taking differently: Take 100 mg by mouth at bedtime. 1  qhs  May increse to 2  qhs  After 1 week) 180 tablet 1   venlafaxine (EFFEXOR) 37.5 MG tablet Take 37.5 mg by  mouth daily.     vitamin B-12 (CYANOCOBALAMIN) 1000 MCG tablet Take 1,000 mcg by mouth daily.     Vitamin D, Ergocalciferol, (DRISDOL) 1.25 MG (50000 UNIT) CAPS capsule Take 50,000 Units by mouth once a week.     Current Facility-Administered Medications on File Prior to Visit  Medication Dose Route Frequency Provider Last Rate Last Admin   bupivacaine (MARCAINE) 0.5 % (with pres) injection 1 mL  1 mL Infiltration Once Anson Fret, MD       lidocaine (XYLOCAINE) 2 % (with pres) injection 20 mg  1 mL Intradermal Once Anson Fret, MD       methylPREDNISolone acetate (DEPO-MEDROL) injection 80 mg  80 mg Intramuscular Once Anson Fret, MD       Allergies  Allergen Reactions   Abilify [Aripiprazole]     Patient is intolerant   Lantus [Insulin Glargine] Other (See Comments)    Elevated blood sugars  Phenergan [Promethazine] Diarrhea and Nausea And Vomiting   Reglan [Metoclopramide] Other (See Comments)    Chest pains   Family History  Problem Relation Age of Onset   Hypertension Mother    Bipolar disorder Mother    Cancer Mother    Depression Father    Cancer Father    Thyroid disease Sister    Hypertension Sister    Bipolar disorder Sister    Heart disease Brother    Bipolar disorder Maternal Aunt    Bipolar disorder Daughter    Colon cancer Neg Hx    Colon polyps Neg Hx    Esophageal cancer Neg Hx    Kidney disease Neg Hx    Gallbladder disease Neg Hx    Diabetes Neg Hx    Dementia Neg Hx    Tremor Neg Hx    Seizures Neg Hx    Migraines Neg Hx    Headache Neg Hx    PE: There were no vitals taken for this visit.   Wt Readings from Last 3 Encounters:  01/26/23 180 lb (81.6 kg)  09/11/22 179 lb 12.8 oz (81.6 kg)  05/07/22 179 lb 12.8 oz (81.6 kg)   Constitutional: overweight, in NAD Eyes:  EOMI, no exophthalmos ENT: no neck masses, no cervical lymphadenopathy Cardiovascular: RRR, No MRG Respiratory: CTA B Musculoskeletal: no deformities Skin:no  rashes Neurological: no tremor with outstretched hands Diabetic Foot Exam - Simple   Simple Foot Form Diabetic Foot exam was performed with the following findings: Yes 01/28/2023  1:29 PM  Visual Inspection No deformities, no ulcerations, no other skin breakdown bilaterally: Yes Sensation Testing Intact to touch and monofilament testing bilaterally: Yes Pulse Check Posterior Tibialis and Dorsalis pulse intact bilaterally: Yes Comments     ASSESSMENT: 1. 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  Component     Latest Ref Rng 07/16/2013  C-Peptide     0.80 - 3.90 ng/mL 1.29  Glucose     70 - 99 mg/dL 782 (H)  Glutamic Acid Decarb Ab     <=1.0 U/mL 11.3 (H)  Pancreatic Islet Cell Antibody     <5 JDF Units 5 (A)  + anti-pancreatic antibodies >> LADA rather than type 2 DM. She still has a positive C peptide >> still has insulin secretion.  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 HL  PLAN:  1.  Patient with poorly controlled LADA, difficult to manage due to depression/anxiety/GI symptoms.  Her sugars usually fluctuate between 30 and 400s.  This appointment was scheduled on an emergent basis after she presented to the emergency room with high blood sugars in the 400s. -At last visit, sugars were elevated at all times of the day, increasing after every meal and with the highest blood sugars approximately between 2 AM and 5 AM.  We discussed about increasing the basal insulin dose (Tresiba/Toujeo based on availability), since upon questioning, she was still using lower doses of Lyumjev than recommended.  We also discussed that she usually needs to increase the doses of insulin and if she sees that the sugars start improving.  I gave her greenlight to change the doses by herself depending on the needs.  We continued metformin at that time.  HbA1c could not be checked at the  time of last visit, since this was not a virtual visit, but latest HbA1c from 4 months ago was higher, at 8.0%. CGM interpretation: -  At today's visit, we reviewed her CGM downloads: It appears that 15% of values are in target range (goal >70%), while 85% are higher than 180 (goal <25%), and 0% are lower than 70 (goal <4%).  The calculated average blood sugar is 261.  The projected HbA1c for the next 3 months (GMI) is 9.5%. -Reviewing the CGM trends, sugars appear to be very high, fluctuating above the target range, even above the 250 mg/dL threshold, with only improvement in blood sugars before lunch, but with an immediate increase afterwards.  The sugars appear to have improved after starting hydroxyzine 2 days ago in the emergency room. -At today's visit, upon questioning, patient tells me that if the sugars are close to goal and lower than 200, she is not taking Lyumjev before meals.  Upon questioning, she is only using sliding scale before meals and only occasionally.  We discussed that without taking a mealtime insulin bolus, we will get control of her diabetes.  I adjusted her mealtime Lyumjev doses to make sure that she is not dropping afterwards, but I did advise her that if she starts dropping, we can adjust them even more.  However, it is mandatory that she takes this before meals.  She can use the sliding scale on an as-needed basis, only if the sugars are higher than 200.  I explained this to the patient and the husband and did several exercises to see if they understood how to do it.  They had several questions about this and I addressed them.  For now, we can continue the same dose of Toujeo/Tresiba.  Evaristo Bury is not covered so I sent a prescription for Toujeo, but she still has [place] at home.  She is not splitting the dose and I advised her that she does not have to. -They want me to give them the name of the ED physician that treated them because they were extremely happy with his care. - I  suggested to:  Patient Instructions  Please continue: - Metformin 500 mg 2x a day with meals  Use: - Toujeo/Tresiba 50 units in am   - Lyumjev:  12 units for a smaller meal 14 units for a regular meal 16 units for a larger meal - Lyumjev Sliding scale: 201-225: + 2 units 225-250: + 3 units 251-275: + 4 units >275: + 5 units If your sugars are <100 and you eat a low carb meal, inject only ~10 units. So, If sugars are <100 before the meals, you still need to take insulin for that meal. Do not correct sugars <300 at bedtime. If sugars are <70 before a meal, bring the sugar up first, then inject insulin for the meals. Allow the sugars to drop to 80 before you start correcting them.  ED physician: Derwood Kaplan, MD.  Please return in 1.5 months.  - we checked her HbA1c: 8.5% (higher) - advised to check sugars at different times of the day - 4x a day, rotating check times - advised for yearly eye exams >> she is UTD - wi check an ACR todayll - return to clinic in 1.5 months  2. PN  -Related to diabetes -She continues nortriptyline and Lyrica; she was on Neurontin in the past but this was tapered off -She is on alpha lipoic acid 600 mg twice a day; also on Nervine Roll On -He had physical therapy for this in the past  3. HL -Latest lipid panel available for review is from 10/2020: LDL above target, triglycerides also  slightly high.  I do not have records for latest lipid panel from Dr. Sherwood Gambler. Lab Results  Component Value Date   CHOL 178 10/17/2020   HDL 45.70 10/17/2020   LDLCALC 100 (H) 10/17/2020   TRIG 165.0 (H) 10/17/2020   CHOLHDL 4 10/17/2020  -She was previously on Crestor 10 mg daily but came off (she did not remember why) but she is currently on Zetia 10 mg daily. -will repeat her lipid panel today  Orders Placed This Encounter  Procedures   Lipid Panel w/reflex Direct LDL   Microalbumin / creatinine urine ratio   POCT glycosylated hemoglobin (Hb A1C)    Carlus Pavlov, MD PhD [facility] Endocrinology

## 2023-01-28 NOTE — Telephone Encounter (Signed)
Sample  Medication: Evaristo Bury Dose: U-200 Quantity:1 pen  RUE:[id number] EXP:06/30/24  Lodema Pilot Afternoon, after speaking with Dr. Elvera Lennox we are going to provide you with another pen of Tresiba U200. I have it labeled with your name on it. Please come by the office and pick this up.    Dicie Beam (Message copied and pasted from mychart encounter 01/24/23)

## 2023-01-29 DIAGNOSIS — F3181 Bipolar II disorder: Secondary | ICD-10-CM | POA: Diagnosis not present

## 2023-01-29 DIAGNOSIS — F411 Generalized anxiety disorder: Secondary | ICD-10-CM | POA: Diagnosis not present

## 2023-01-30 ENCOUNTER — Encounter: Payer: Self-pay | Admitting: Internal Medicine

## 2023-01-30 LAB — MICROALBUMIN / CREATININE URINE RATIO
Creatinine, Urine: 222 mg/dL (ref 20–275)
Microalb Creat Ratio: 17 mg/g{creat} (ref <30)
Microalb, Ur: 3.7 mg/dL

## 2023-01-30 LAB — LIPID PANEL W/REFLEX DIRECT LDL
Cholesterol: 203 mg/dL — ABNORMAL HIGH (ref <200)
HDL: 55 mg/dL (ref >=50)
LDL Cholesterol (Calc): 100 mg/dL — ABNORMAL HIGH
Non-HDL Cholesterol (Calc): 148 mg/dL — ABNORMAL HIGH (ref <130)
Total CHOL/HDL Ratio: 3.7 (calc) (ref <5.0)
Triglycerides: 357 mg/dL — ABNORMAL HIGH (ref <150)

## 2023-01-31 ENCOUNTER — Other Ambulatory Visit: Payer: Self-pay | Admitting: Internal Medicine

## 2023-01-31 MED ORDER — ROSUVASTATIN CALCIUM 5 MG PO TABS: 5.0000 mg | ORAL_TABLET | Freq: Every day | ORAL | 3 refills | Status: DC

## 2023-02-04 DIAGNOSIS — L0202 Furuncle of face: Secondary | ICD-10-CM | POA: Diagnosis not present

## 2023-02-04 DIAGNOSIS — B9689 Other specified bacterial agents as the cause of diseases classified elsewhere: Secondary | ICD-10-CM | POA: Diagnosis not present

## 2023-02-13 ENCOUNTER — Telehealth: Payer: Self-pay

## 2023-02-13 ENCOUNTER — Other Ambulatory Visit (HOSPITAL_COMMUNITY): Payer: Self-pay

## 2023-02-13 NOTE — Telephone Encounter (Signed)
Received PA questions from [place], faxing form, answers, supporting labs, and chart notes to [employer] at [phone]

## 2023-02-13 NOTE — Telephone Encounter (Signed)
Pharmacy Patient Advocate Encounter  Received notification from [employer] that Prior Authorization for [name] has been APPROVED from 02/13/2023 to 12/31/2097   PA #/Case ID/Reference #: [id number]

## 2023-02-15 DIAGNOSIS — K5900 Constipation, unspecified: Secondary | ICD-10-CM | POA: Diagnosis not present

## 2023-02-15 DIAGNOSIS — G894 Chronic pain syndrome: Secondary | ICD-10-CM | POA: Diagnosis not present

## 2023-02-15 DIAGNOSIS — M797 Fibromyalgia: Secondary | ICD-10-CM | POA: Diagnosis not present

## 2023-02-15 DIAGNOSIS — M47812 Spondylosis without myelopathy or radiculopathy, cervical region: Secondary | ICD-10-CM | POA: Diagnosis not present

## 2023-02-27 ENCOUNTER — Telehealth: Payer: Self-pay

## 2023-02-27 ENCOUNTER — Other Ambulatory Visit: Payer: Self-pay

## 2023-02-27 DIAGNOSIS — F3181 Bipolar II disorder: Secondary | ICD-10-CM | POA: Diagnosis not present

## 2023-02-27 DIAGNOSIS — F411 Generalized anxiety disorder: Secondary | ICD-10-CM | POA: Diagnosis not present

## 2023-02-27 MED ORDER — TOUJEO SOLOSTAR 300 UNIT/ML ~~LOC~~ SOPN: 50.0000 [IU] | PEN_INJECTOR | Freq: Every day | SUBCUTANEOUS | 3 refills | Status: AC

## 2023-02-27 NOTE — Telephone Encounter (Signed)
 Requested Prescriptions   Signed Prescriptions Disp Refills   insulin glargine, 1 Unit Dial, (TOUJEO SOLOSTAR) 300 UNIT/ML Solostar Pen 18 mL 3    Sig: Inject 50 Units into the skin daily.    Authorizing Provider: Carlus Pavlov    Ordering User: Pollie Meyer

## 2023-03-04 DIAGNOSIS — F3181 Bipolar II disorder: Secondary | ICD-10-CM | POA: Diagnosis not present

## 2023-03-04 DIAGNOSIS — L0202 Furuncle of face: Secondary | ICD-10-CM | POA: Diagnosis not present

## 2023-03-04 DIAGNOSIS — F411 Generalized anxiety disorder: Secondary | ICD-10-CM | POA: Diagnosis not present

## 2023-03-04 DIAGNOSIS — B9689 Other specified bacterial agents as the cause of diseases classified elsewhere: Secondary | ICD-10-CM | POA: Diagnosis not present

## 2023-03-06 ENCOUNTER — Ambulatory Visit: Payer: Medicare Other | Admitting: Rehabilitative and Restorative Service Providers"

## 2023-03-13 DIAGNOSIS — M797 Fibromyalgia: Secondary | ICD-10-CM | POA: Diagnosis not present

## 2023-03-13 DIAGNOSIS — K5900 Constipation, unspecified: Secondary | ICD-10-CM | POA: Diagnosis not present

## 2023-03-13 DIAGNOSIS — M47812 Spondylosis without myelopathy or radiculopathy, cervical region: Secondary | ICD-10-CM | POA: Diagnosis not present

## 2023-03-13 DIAGNOSIS — G894 Chronic pain syndrome: Secondary | ICD-10-CM | POA: Diagnosis not present

## 2023-03-13 DIAGNOSIS — Z79891 Long term (current) use of opiate analgesic: Secondary | ICD-10-CM | POA: Diagnosis not present

## 2023-03-19 ENCOUNTER — Ambulatory Visit: Admitting: Physical Therapy

## 2023-03-19 NOTE — Telephone Encounter (Signed)
 Sample put back in stock

## 2023-03-27 DIAGNOSIS — F411 Generalized anxiety disorder: Secondary | ICD-10-CM | POA: Diagnosis not present

## 2023-03-27 DIAGNOSIS — F3181 Bipolar II disorder: Secondary | ICD-10-CM | POA: Diagnosis not present

## 2023-03-27 NOTE — Telephone Encounter (Signed)
 Pt has been notified and voices understanding.

## 2023-04-01 DIAGNOSIS — L0202 Furuncle of face: Secondary | ICD-10-CM | POA: Diagnosis not present

## 2023-04-01 DIAGNOSIS — B9689 Other specified bacterial agents as the cause of diseases classified elsewhere: Secondary | ICD-10-CM | POA: Diagnosis not present

## 2023-04-02 ENCOUNTER — Encounter: Payer: Self-pay | Admitting: Internal Medicine

## 2023-04-02 ENCOUNTER — Ambulatory Visit (INDEPENDENT_AMBULATORY_CARE_PROVIDER_SITE_OTHER): Payer: Medicare Other | Admitting: Internal Medicine

## 2023-04-02 VITALS — BP 130/70 | HR 93 | Ht 64.0 in | Wt 174.4 lb

## 2023-04-02 DIAGNOSIS — E139 Other specified diabetes mellitus without complications: Secondary | ICD-10-CM | POA: Diagnosis not present

## 2023-04-02 DIAGNOSIS — G63 Polyneuropathy in diseases classified elsewhere: Secondary | ICD-10-CM

## 2023-04-02 DIAGNOSIS — E785 Hyperlipidemia, unspecified: Secondary | ICD-10-CM

## 2023-04-02 NOTE — Progress Notes (Signed)
 Patient ID: [name], female   DOB: 1955-02-14, 67 y.o.   MRN: [id number]  HPI: [name] is a 68 y.o.-year-old female, initially referred by her PCP, Dr. Assunta Found (PA: Lenise Herald), returning for f/u for LADA, dx 2005, insulin-dependent since ~2006, controlled, with complications (PN, gastroparesis?).  Last visit 2 months ago.    Interim history: She has bipolar disease and also has short-term memory loss, tension headaches, disequilibrium for which she sees neurology.  She also has anxiety and sees a Veterinary surgeon.   She continues to have headaches and posterior neck pain/mobility.  She was referred to neurology/neurosurgery.  She could not be evaluated until she stopped Xanax. Before our last visit, she presented to the emergency room 01/26/2023 for high blood sugars, in the 400s, after having stopped Xanax and in the setting of high anxiety.  DKA and HHS were ruled out.  She was given insulin and sugars decreased to the 300s.  She was started on hydroxyzine to help with anxiety and she was feeling better. She also had an appointment with her psychiatrist and her anxiety medications were adjusted. As of now, she is on a pain medication patch. She was able to switch from [place] to [place] few days ago and she increased the dose.  She feels that her sugars are better.  Reviewed history: She was on an Omnipod insulin pump on 08/01/2015. She had severe anxiety and depression and got so overwhelmed with the insulin pump that we had to take her off the pump.  We switched her to a basal-bolus insulin regimen, but she was unable to calculate the insulin doses based on insulin to carb ratio, so now she is using fixed rapid acting insulin doses.  Reviewed HbA1c levels: Lab Results  Component Value Date   HGBA1C 8.5 (A) 01/28/2023   HGBA1C 8.0 (A) 09/11/2022   HGBA1C 7.2 (A) 05/07/2022  05/07/2022: HbA1c calculated from fructosamine is 8.4%, slightly lower than before. 08/25/2021: HbA1c  calculated from fructosamine is 8.6%, higher than before.  She is on: - Metformin 1000 mg with dinner >> 500 mg 2x a day - Lantus 20 units in am and 25 >> 28 units at night >> Tresiba  46 units daily >> Toujeo/Tresiba 50 units daily >> taking 30 units in am and 20 units at night >> Toujeo 30 units 2x a day - NovoLog: >> Lyumjev >> 12 units for a smaller meal 14 units for a regular meal 16 units for a larger meal - Novolog Sliding scale: >> Lyumjev 201-225: + 2 units 225-250: + 3 units 251-275: + 4 units >275: + 5 units If your sugars are <100 and you eat a low carb meal, inject only ~6 units. So, If sugars are <100 before the meals, you still need to take insulin for that meal. Do not correct sugars <300 at bedtime. If sugars are <70 before a meal, bring the sugar up first, then inject insulin for the meals.  Pt checks her sugars >4x a day with her Dexcom CGM:  Prev.:     Previously:  Lowest sugar was 32 >>... 40 >> ... 55 ...>> 80 >> 71; she has hypoglycemia awareness in the 50s Highest sugar was 358 >> 300s >> 494 >> 393 >> HI >> 391. She was in the emergency room in 09/2022  after having had blood sugar of 494.  She was given IV insulin and hydrated and discharged.  No signs of DKA. At today's visit, CBG 432.  They mention that she started to have high blood sugars approximately 3 days ago.  She does mention several panic attacks in this period.  No CKD, last BUN/creatinine:  Lab Results  Component Value Date   BUN 34 (H) 01/26/2023   CREATININE 0.70 01/26/2023   No microalbuminuria: Lab Results  Component Value Date   MICRALBCREAT 17 01/28/2023   MICRALBCREAT 5.4 11/07/2016    + Dyslipidemia: Lab Results  Component Value Date   CHOL 203 (H) 01/28/2023   HDL 55 01/28/2023   LDLCALC 100 (H) 01/28/2023   TRIG 357 (H) 01/28/2023   CHOLHDL 3.7 01/28/2023  She was started on Crestor 10 mg daily - but taken off >> now restarted after the above labs: 5 mg daily, on  Ezetimibe now.  -Last eye exam was 07/2022: No DR reportedly, but dry eyes >> Dr Dione Booze. Had cataract sx's.  -+ Numbness and tingling in her feet.  Also, occasional pain.  She was on Neurontin, but now only on nortriptyline.  These are refilled by PCP.  She is also on alpha-lipoic acid.  She sees a podiatrist [facility]).  She had 2 toenails removed in the past due to fungal infection.  Last foot exam 01/28/2023.  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  She has a diagnosis of Parkinson's disease.  She also has bipolar disease. Also: GERD, HTN, fibromyalgia.  Latest TSH was normal: 07/31/2022: TSH 1.13 Lab Results  Component Value Date   TSH 0.533 03/03/2020   ROS: + See HPI  I reviewed pt's medications, allergies, PMH, social hx, family hx, and changes were documented in the history of present illness. Otherwise, unchanged from my initial visit note.  Past Medical History:  Diagnosis Date   Anxiety    Bipolar 1 disorder (HCC)    Breast density 06/25/2012   Right breast density, will get mammogram and [place]   Depression    Diabetes mellitus without complication (HCC)    type 1   Duodenitis    Fibromyalgia    GERD (gastroesophageal reflux disease)    History of kidney stones    HTN (hypertension)    Hyperlipidemia    IBS (irritable bowel syndrome)    Neuropathy    Sepsis (HCC) 02/27/2019   Sepsis (HCC) 2023   Past Surgical History:  Procedure Laterality Date   ABDOMINAL HYSTERECTOMY     partial   CHOLECYSTECTOMY     COLONOSCOPY     CYSTOSCOPY WITH RETROGRADE PYELOGRAM, URETEROSCOPY AND STENT PLACEMENT Right 03/31/2019   Procedure: CYSTOSCOPY WITH RETROGRADE PYELOGRAM, URETEROSCOPY AND STENT PLACEMENT;  Surgeon: Noel Christmas, MD;  Location: [facility];  Service: Urology;  Laterality: Right;   CYSTOSCOPY WITH STENT PLACEMENT Right 03/02/2019   Procedure: CYSTOSCOPY WITH STENT PLACEMENT;  Surgeon: Noel Christmas, MD;  Location: [facility] OR;   Service: Urology;  Laterality: Right;   HOLMIUM LASER APPLICATION Right 03/31/2019   Procedure: HOLMIUM LASER APPLICATION;  Surgeon: Noel Christmas, MD;  Location: [facility];  Service: Urology;  Laterality: Right;   UPPER GASTROINTESTINAL ENDOSCOPY     Social History   Socioeconomic History   Marital status: Married    Spouse name: phillip   Number of children: 1   Years of education: 12   Highest education level: High school graduate  Occupational History   Occupation: Disabled  Tobacco Use   Smoking status: Never   Smokeless tobacco: Never  Vaping Use   Vaping status: Some  Days   Substances: Flavoring  Substance and Sexual Activity   Alcohol use: No    Alcohol/week: 0.0 standard drinks of alcohol   Drug use: No   Sexual activity: Not Currently  Other Topics Concern   Not on file  Social History Narrative   Lives at home w/ her husband   Left-handed   Caffeine: 1-2 cups of coffee daily   Social Drivers of Health   Financial Resource Strain: Low Risk  (12/07/2016)   Overall Financial Resource Strain (CARDIA)    Difficulty of Paying Living Expenses: Not hard at all  Food Insecurity: No Food Insecurity (12/07/2016)   Hunger Vital Sign    Worried About Running Out of Food in the Last Year: Never true    Ran Out of Food in the Last Year: Never true  Transportation Needs: Unmet Transportation Needs (12/07/2016)   PRAPARE - Administrator, Civil Service (Medical): Yes    Lack of Transportation (Non-Medical): Yes  Physical Activity: Inactive (12/07/2016)   Exercise Vital Sign    Days of Exercise per Week: 0 days    Minutes of Exercise per Session: 0 min  Stress: Not on file  Social Connections: Somewhat Isolated (12/07/2016)   Social Connection and Isolation Panel [NHANES]    Frequency of Communication with Friends and Family: Never    Frequency of Social Gatherings with Friends and Family: Never    Attends Religious Services: More than 4  times per year    Active Member of [employer] or Organizations: No    Attends Banker Meetings: Never    Marital Status: Married  Catering manager Violence: Not At Risk (12/07/2016)   Humiliation, Afraid, Rape, and Kick questionnaire    Fear of Current or Ex-Partner: No    Emotionally Abused: No    Physically Abused: No    Sexually Abused: No   Current Outpatient Medications on File Prior to Visit  Medication Sig Dispense Refill   acetaminophen (TYLENOL) 500 MG tablet Take 1,000 mg by mouth every 6 (six) hours as needed for mild pain or headache.     ALPRAZolam (XANAX) 1 MG tablet Take 1 mg by mouth 4 (four) times daily as needed for anxiety.      AMBULATORY NON FORMULARY MEDICATION Squatty Potty x 1 1 each 0   amitriptyline (ELAVIL) 25 MG tablet Take 25 mg by mouth at bedtime.     amLODipine (NORVASC) 5 MG tablet Take 5 mg by mouth daily.     aspirin EC 81 MG tablet Take 1 tablet (81 mg total) by mouth daily. Swallow whole. 30 tablet 11   BD PEN NEEDLE NANO U/F 32G X 4 MM MISC USE WITH INSULIN PEN FOUR TIMES A DAY 400 each 3   BENICAR 40 MG tablet Take 40 mg by mouth daily.     busPIRone (BUSPAR) 10 MG tablet Take 10 mg by mouth in the morning and at bedtime.     Butalbital-APAP-Caffeine 50-325-40 MG capsule Take 1 capsule by mouth 4 (four) times daily as needed.     cholecalciferol (VITAMIN D3) 25 MCG (1000 UNIT) tablet Take 1,000 Units by mouth daily.     Continuous Blood Gluc Receiver (DEXCOM G6 RECEIVER) DEVI Use as instructed to check blood sugar. 1 each 0   Continuous Glucose Sensor (DEXCOM G6 SENSOR) MISC Use as instructed to check blood sugar change every 10 days 9 each 3   Continuous Glucose Transmitter (DEXCOM G6 TRANSMITTER) MISC Use as instructed to  check blood sugar. Change every 90 days 1 each 3   Docusate Sodium (COLACE PO) Take 100 mg by mouth daily.     esomeprazole (NEXIUM) 40 MG capsule Take 40 mg by mouth 2 (two) times daily.     ezetimibe (ZETIA) 10 MG tablet  Take 10 mg by mouth daily.     Glucagon (BAQSIMI ONE PACK) 3 MG/DOSE POWD Use 1 spray in the nose as needed for hypoglycemia 1 each PRN   glucose blood (FREESTYLE LITE) test strip USE TO CHECK BLOOD SUGAR 6 TIMES PER DAY 600 each 3   hydrOXYzine (ATARAX) 25 MG tablet Take 2 tablets (50 mg total) by mouth every 8 (eight) hours as needed. 18 tablet 0   insulin degludec (TRESIBA FLEXTOUCH) 200 UNIT/ML FlexTouch Pen Inject 44-48 Units into the skin in the morning. 87 mL 2   insulin glargine, 1 Unit Dial, (TOUJEO SOLOSTAR) 300 UNIT/ML Solostar Pen Inject 50 Units into the skin daily. 18 mL 3   Insulin Lispro-aabc (LYUMJEV KWIKPEN) 200 UNIT/ML KwikPen Inject 13-18 Units into the skin 3 (three) times daily before meals. 18 mL 3   Insulin Syringes, Disposable, U-100 1 ML MISC To use for insulin injection.. 100 each 1   Lancets (FREESTYLE) lancets USE TO TEST BLOOD SUGAR 4 TO 6 TIMES DAILY AS INSTRUCTED 300 each 6   losartan (COZAAR) 25 MG tablet Take 25 mg by mouth daily.     metFORMIN (GLUCOPHAGE) 500 MG tablet TAKE 1 TABLET TWICE A DAY WITH MEALS 180 tablet 3   methocarbamol (ROBAXIN) 500 MG tablet Take 500 mg by mouth 4 (four) times daily as needed.     nortriptyline (PAMELOR) 50 MG capsule Take 50 mg by mouth at bedtime.      ondansetron (ZOFRAN-ODT) 4 MG disintegrating tablet PLACE 1 TO 2 TABLETS ON TONGUE EVERY 4 TO 6 HOURS AS NEEDED FOR NAUSEA (Patient taking differently: Take 4-8 mg by mouth every 4 (four) hours as needed for nausea or vomiting.) 180 tablet 4   OSCIMIN 0.125 MG SUBL DISSOLVE 1 TABLET UNDER THE TONGUE EVERY 4 HOURS AS NEEDED (NEED APPOINTMENT) 360 tablet 0   pregabalin (LYRICA) 50 MG capsule START WITH TWICE DAILY. IF NO SIDE EFFECTS IN 1 WEEK MAY INCREASE TO 3X DAILY.*STOP GABAPENTIN* 90 capsule 2   Probiotic CAPS Take 1 capsule by mouth daily. 30 capsule 1   Propylene Glycol (SYSTANE BALANCE OP) Place 1 drop into both eyes daily as needed (dry eyes).     rosuvastatin (CRESTOR) 5  MG tablet Take 1 tablet (5 mg total) by mouth daily. 90 tablet 3   traZODone (DESYREL) 100 MG tablet 1  qhs  May increse to 2  qhs  After 1 week (Patient taking differently: Take 100 mg by mouth at bedtime. 1  qhs  May increse to 2  qhs  After 1 week) 180 tablet 1   venlafaxine (EFFEXOR) 37.5 MG tablet Take 37.5 mg by mouth daily.     vitamin B-12 (CYANOCOBALAMIN) 1000 MCG tablet Take 1,000 mcg by mouth daily.     Vitamin D, Ergocalciferol, (DRISDOL) 1.25 MG (50000 UNIT) CAPS capsule Take 50,000 Units by mouth once a week.     Current Facility-Administered Medications on File Prior to Visit  Medication Dose Route Frequency Provider Last Rate Last Admin   bupivacaine (MARCAINE) 0.5 % (with pres) injection 1 mL  1 mL Infiltration Once Anson Fret, MD       lidocaine (XYLOCAINE) 2 % (with pres)  injection 20 mg  1 mL Intradermal Once Anson Fret, MD       methylPREDNISolone acetate (DEPO-MEDROL) injection 80 mg  80 mg Intramuscular Once Anson Fret, MD       Allergies  Allergen Reactions   Abilify [Aripiprazole]     Patient is intolerant   Lantus [Insulin Glargine] Other (See Comments)    Elevated blood sugars   Phenergan [Promethazine] Diarrhea and Nausea And Vomiting   Reglan [Metoclopramide] Other (See Comments)    Chest pains   Family History  Problem Relation Age of Onset   Hypertension Mother    Bipolar disorder Mother    Cancer Mother    Depression Father    Cancer Father    Thyroid disease Sister    Hypertension Sister    Bipolar disorder Sister    Heart disease Brother    Bipolar disorder Maternal Aunt    Bipolar disorder Daughter    Colon cancer Neg Hx    Colon polyps Neg Hx    Esophageal cancer Neg Hx    Kidney disease Neg Hx    Gallbladder disease Neg Hx    Diabetes Neg Hx    Dementia Neg Hx    Tremor Neg Hx    Seizures Neg Hx    Migraines Neg Hx    Headache Neg Hx    PE: BP 130/70   Pulse 93   Ht 5\' 4"  (1.626 m)   Wt 174 lb 6.4 oz (79.1 kg)    SpO2 92%   BMI 29.94 kg/m    Wt Readings from Last 3 Encounters:  04/02/23 174 lb 6.4 oz (79.1 kg)  01/26/23 180 lb (81.6 kg)  09/11/22 179 lb 12.8 oz (81.6 kg)   Constitutional: overweight, in NAD Eyes:  EOMI, no exophthalmos ENT: no neck masses, no cervical lymphadenopathy Cardiovascular: RRR (no tachycardia at the time of the exam) , No MRG Respiratory: CTA B Musculoskeletal: no deformities Skin:no rashes Neurological: no tremor with outstretched hands  ASSESSMENT: 1. 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  Component     Latest Ref Rng 07/16/2013  C-Peptide     0.80 - 3.90 ng/mL 1.29  Glucose     70 - 99 mg/dL 841 (H)  Glutamic Acid Decarb Ab     <=1.0 U/mL 11.3 (H)  Pancreatic Islet Cell Antibody     <5 JDF Units 5 (A)  + anti-pancreatic antibodies >> LADA rather than type 2 DM. She still has a positive C peptide >> still has insulin secretion.  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 HL  PLAN:  1.  Patient with poorly controlled LADA, difficult to manage due to depression/anxiety/GI symptoms.  Her sugars usually blood.  Between 30 and 400s.  At last visit, we adjusted the doses of Lyumjev.  Sugars appeared to be very high, fluctuating above the target range, even above 250 mg/dL, with only improvement in blood sugars before lunch.  However, they appear to be better after starting hydroxyzine 2 days prior, in the emergency room.  Upon questioning, patient was not taking Lyumjev when the sugars were at goal before meals and she was only using the sliding scale occasionally.  I discussed with her and her husband how to take the Lyumjev correctly.  Since Evaristo Bury was not covered we sent a prescription for Toujeo.  A PA for this was approved.  She was splitting  the dose and I advised her that she did not need to split the dose with a longer acting  insulins. -HbA1c at last visit was higher, at 8.5%. CGM interpretation: -At today's visit, we reviewed her CGM downloads: It appears that 21% of values are in target range (goal >70%), while 79% are higher than 180 (goal <25%), and 0% are lower than 70 (goal <4%).  The calculated average blood sugar is 235.  The projected HbA1c for the next 3 months (GMI) is 8.9%. -Reviewing the CGM trends, sugars appear to be slightly improved, now dropping to the normal range in the first part of the day, but increasing significantly after lunch and remaining elevated with even higher blood sugars from approximately 3 AM to 9 AM.  She mentions that switching to Toujeo and increasing the dose several days ago helped her blood sugars.  However, upon questioning, she is still using low doses of Lyumjev as she mentions that she is not eating larger meals.  At today's visit I again discussed with her and her husband about the fact that she needs to bolus more insulin for meals that increase her blood sugars, even if they are small.  They agreed to do so.  We also discussed about the fact that a balance regimen will have approximately the same amount of units in the rapid acting insulin compared to long-acting insulin.  As of now, she is getting a double amount of Toujeo compared to Lyumjev.  Patient and her husband understand this and plan to increase the doses of Lyumjev.  I advised her how to titrate the doses up. - I suggested to:  Patient Instructions  Please continue: - Metformin 500 mg 2x a day with meals - Toujeo 30 units 2x a day  Change: - Lyumjev:  12 units for a smaller meal 14 units for a regular meal 16 units for a larger meal - Lyumjev Sliding scale: 201-225: + 2 units 225-250: + 3 units 251-275: + 4 units >275: + 5 units If your sugars are <100 and you eat a low carb meal, inject only ~10 units. So, If sugars are <100 before the meals, you still need to take insulin for that meal. Do not correct  sugars <300 at bedtime. If sugars are <70 before a meal, bring the sugar up first, then inject insulin for the meals. Allow the sugars to drop to 80 before you start correcting them.  Please return in 2-3 months.  - advised to check sugars at different times of the day - 4x a day, rotating check times - advised for yearly eye exams >> she is UTD - return to clinic in 2-3 months  2. PN  -Related to diabetes -On nortriptyline and Lyrica; she was on Neurontin in the past but this was tapered off -On alpha lipoic acid 600 mg twice a day; also on Nervine Roll On -He had physical therapy for this in the past  3. HL -Latest lipid panel reviewed from last visit: LDL above target, triglycerides quite high: Lab Results  Component Value Date   CHOL 203 (H) 01/28/2023   HDL 55 01/28/2023   LDLCALC 100 (H) 01/28/2023   TRIG 357 (H) 01/28/2023   CHOLHDL 3.7 01/28/2023  -She was previously on Crestor 10 mg daily but came off (she did not remember why) -I recommended to start back on the low-dose Crestor, 5 mg daily.  At that time she was on Zetia 10 mg daily which we continued. -At  today's visit, she is taking the 2 medications above.  Carlus Pavlov, MD PhD [facility] Endocrinology

## 2023-04-02 NOTE — Patient Instructions (Addendum)
 Please continue: - Metformin 500 mg 2x a day with meals - Toujeo 30 units 2x a day  Change: - Lyumjev:  12 units for a smaller meal 14 units for a regular meal 16 units for a larger meal - Lyumjev Sliding scale: 201-225: + 2 units 225-250: + 3 units 251-275: + 4 units >275: + 5 units If your sugars are <100 and you eat a low carb meal, inject only ~10 units. So, If sugars are <100 before the meals, you still need to take insulin for that meal. Do not correct sugars <300 at bedtime. If sugars are <70 before a meal, bring the sugar up first, then inject insulin for the meals. Allow the sugars to drop to 80 before you start correcting them.  Please return in 2-3 months.

## 2023-04-09 ENCOUNTER — Encounter: Payer: Self-pay | Admitting: Internal Medicine

## 2023-04-09 DIAGNOSIS — E139 Other specified diabetes mellitus without complications: Secondary | ICD-10-CM

## 2023-04-09 DIAGNOSIS — M797 Fibromyalgia: Secondary | ICD-10-CM | POA: Diagnosis not present

## 2023-04-09 DIAGNOSIS — K5900 Constipation, unspecified: Secondary | ICD-10-CM | POA: Diagnosis not present

## 2023-04-09 DIAGNOSIS — G894 Chronic pain syndrome: Secondary | ICD-10-CM | POA: Diagnosis not present

## 2023-04-09 DIAGNOSIS — M47812 Spondylosis without myelopathy or radiculopathy, cervical region: Secondary | ICD-10-CM | POA: Diagnosis not present

## 2023-04-10 MED ORDER — DEXCOM G6 SENSOR MISC: 3 refills | Status: DC

## 2023-04-10 MED ORDER — DEXCOM G6 SENSOR MISC: 0 refills | Status: DC

## 2023-04-17 DIAGNOSIS — L981 Factitial dermatitis: Secondary | ICD-10-CM | POA: Diagnosis not present

## 2023-04-24 DIAGNOSIS — F3181 Bipolar II disorder: Secondary | ICD-10-CM | POA: Diagnosis not present

## 2023-04-24 DIAGNOSIS — F411 Generalized anxiety disorder: Secondary | ICD-10-CM | POA: Diagnosis not present

## 2023-04-29 ENCOUNTER — Other Ambulatory Visit: Payer: Self-pay

## 2023-04-29 MED ORDER — ROSUVASTATIN CALCIUM 5 MG PO TABS: 5.0000 mg | ORAL_TABLET | Freq: Every day | ORAL | 3 refills | Status: AC

## 2023-05-01 DIAGNOSIS — M47812 Spondylosis without myelopathy or radiculopathy, cervical region: Secondary | ICD-10-CM | POA: Diagnosis not present

## 2023-05-01 DIAGNOSIS — K5900 Constipation, unspecified: Secondary | ICD-10-CM | POA: Diagnosis not present

## 2023-05-01 DIAGNOSIS — G894 Chronic pain syndrome: Secondary | ICD-10-CM | POA: Diagnosis not present

## 2023-05-01 DIAGNOSIS — M797 Fibromyalgia: Secondary | ICD-10-CM | POA: Diagnosis not present

## 2023-05-06 DIAGNOSIS — N3020 Other chronic cystitis without hematuria: Secondary | ICD-10-CM | POA: Diagnosis not present

## 2023-05-06 DIAGNOSIS — K5792 Diverticulitis of intestine, part unspecified, without perforation or abscess without bleeding: Secondary | ICD-10-CM | POA: Diagnosis not present

## 2023-05-06 DIAGNOSIS — K5730 Diverticulosis of large intestine without perforation or abscess without bleeding: Secondary | ICD-10-CM | POA: Diagnosis not present

## 2023-05-06 DIAGNOSIS — N200 Calculus of kidney: Secondary | ICD-10-CM | POA: Diagnosis not present

## 2023-05-06 DIAGNOSIS — R1084 Generalized abdominal pain: Secondary | ICD-10-CM | POA: Diagnosis not present

## 2023-05-07 DIAGNOSIS — L700 Acne vulgaris: Secondary | ICD-10-CM | POA: Diagnosis not present

## 2023-05-20 DIAGNOSIS — L858 Other specified epidermal thickening: Secondary | ICD-10-CM | POA: Diagnosis not present

## 2023-05-20 DIAGNOSIS — D485 Neoplasm of uncertain behavior of skin: Secondary | ICD-10-CM | POA: Diagnosis not present

## 2023-05-20 DIAGNOSIS — L568 Other specified acute skin changes due to ultraviolet radiation: Secondary | ICD-10-CM | POA: Diagnosis not present

## 2023-05-20 DIAGNOSIS — B079 Viral wart, unspecified: Secondary | ICD-10-CM | POA: Diagnosis not present

## 2023-05-22 DIAGNOSIS — F3181 Bipolar II disorder: Secondary | ICD-10-CM | POA: Diagnosis not present

## 2023-05-22 DIAGNOSIS — F411 Generalized anxiety disorder: Secondary | ICD-10-CM | POA: Diagnosis not present

## 2023-05-29 DIAGNOSIS — K5900 Constipation, unspecified: Secondary | ICD-10-CM | POA: Diagnosis not present

## 2023-05-29 DIAGNOSIS — M797 Fibromyalgia: Secondary | ICD-10-CM | POA: Diagnosis not present

## 2023-05-29 DIAGNOSIS — G894 Chronic pain syndrome: Secondary | ICD-10-CM | POA: Diagnosis not present

## 2023-05-29 DIAGNOSIS — M47812 Spondylosis without myelopathy or radiculopathy, cervical region: Secondary | ICD-10-CM | POA: Diagnosis not present

## 2023-05-31 DIAGNOSIS — I10 Essential (primary) hypertension: Secondary | ICD-10-CM | POA: Diagnosis not present

## 2023-05-31 DIAGNOSIS — E1140 Type 2 diabetes mellitus with diabetic neuropathy, unspecified: Secondary | ICD-10-CM | POA: Diagnosis not present

## 2023-05-31 DIAGNOSIS — E663 Overweight: Secondary | ICD-10-CM | POA: Diagnosis not present

## 2023-05-31 DIAGNOSIS — J329 Chronic sinusitis, unspecified: Secondary | ICD-10-CM | POA: Diagnosis not present

## 2023-05-31 DIAGNOSIS — F3110 Bipolar disorder, current episode manic without psychotic features, unspecified: Secondary | ICD-10-CM | POA: Diagnosis not present

## 2023-05-31 DIAGNOSIS — F3170 Bipolar disorder, currently in remission, most recent episode unspecified: Secondary | ICD-10-CM | POA: Diagnosis not present

## 2023-05-31 DIAGNOSIS — Z6828 Body mass index (BMI) 28.0-28.9, adult: Secondary | ICD-10-CM | POA: Diagnosis not present

## 2023-05-31 DIAGNOSIS — F419 Anxiety disorder, unspecified: Secondary | ICD-10-CM | POA: Diagnosis not present

## 2023-06-04 DIAGNOSIS — B078 Other viral warts: Secondary | ICD-10-CM | POA: Diagnosis not present

## 2023-06-04 DIAGNOSIS — L538 Other specified erythematous conditions: Secondary | ICD-10-CM | POA: Diagnosis not present

## 2023-06-04 DIAGNOSIS — L568 Other specified acute skin changes due to ultraviolet radiation: Secondary | ICD-10-CM | POA: Diagnosis not present

## 2023-06-13 DIAGNOSIS — Z9229 Personal history of other drug therapy: Secondary | ICD-10-CM | POA: Diagnosis not present

## 2023-06-13 DIAGNOSIS — E785 Hyperlipidemia, unspecified: Secondary | ICD-10-CM | POA: Diagnosis not present

## 2023-06-13 DIAGNOSIS — Z0001 Encounter for general adult medical examination with abnormal findings: Secondary | ICD-10-CM | POA: Diagnosis not present

## 2023-06-13 DIAGNOSIS — R635 Abnormal weight gain: Secondary | ICD-10-CM | POA: Diagnosis not present

## 2023-06-13 DIAGNOSIS — E559 Vitamin D deficiency, unspecified: Secondary | ICD-10-CM | POA: Diagnosis not present

## 2023-06-18 ENCOUNTER — Other Ambulatory Visit (HOSPITAL_COMMUNITY): Payer: Self-pay

## 2023-06-18 ENCOUNTER — Encounter: Payer: Self-pay | Admitting: Internal Medicine

## 2023-06-18 ENCOUNTER — Telehealth: Payer: Self-pay

## 2023-06-18 DIAGNOSIS — E139 Other specified diabetes mellitus without complications: Secondary | ICD-10-CM

## 2023-06-18 MED ORDER — DEXCOM G7 SENSOR MISC: 3 refills | Status: AC

## 2023-06-18 NOTE — Telephone Encounter (Signed)
 Pt called and stating that her blood sugar has been high.  She states that her Blood sugar has been up to 400 at night  This morning she took 12 units of Lyumjev  and her blood sugar was 288.  Her current blood sugar (via finger stick : 119)

## 2023-06-18 NOTE — Telephone Encounter (Signed)
 J, I reviewed her sensor data and the sugars do appear to stay high.  I am worried that maybe Toujeo  is not absorbed well from the injection site.  Is it possible that she can change injection sites, for example from the abdomen to the upper thighs, or even the upper arms.  Please let us  know how this goes.

## 2023-06-18 NOTE — Telephone Encounter (Signed)
Pt needs a PA for Dexcom G7

## 2023-06-18 NOTE — Telephone Encounter (Signed)
 Per test claim: The current 90 day co-pay is, $129.  No PA needed at this time.

## 2023-06-18 NOTE — Telephone Encounter (Signed)
 Pt husband Forestine Igo has been notified and voices understanding. They will try a different site.

## 2023-06-18 NOTE — Telephone Encounter (Signed)
 Pharmacy Patient Advocate Encounter   Received notification from Pt Calls Messages that prior authorization for Dexcom G7 Sensor is required/requested.   Insurance verification completed.   The patient is insured through [employer] .   Per test claim: The current 90 day co-pay is, $129.  No PA needed at this time. This test claim was processed through [facility]- copay amounts may vary at other pharmacies due to pharmacy/plan contracts, or as the patient moves through the different stages of their insurance plan.

## 2023-06-20 MED ORDER — DEXCOM G6 TRANSMITTER MISC: 0 refills | Status: DC

## 2023-06-20 NOTE — Addendum Note (Signed)
 Addended by: Vernon Goodpasture on: 06/20/2023 09:41 AM   Modules accepted: Orders

## 2023-06-20 NOTE — Telephone Encounter (Signed)
 I called and spoke with the husband [name] and I advised him he may want to reach out to them about the G7 because that's the rx that was sent and not the Dexcom g6.   While speaking with [name] he states that he would pay for 1 transmitter oop at the local pharmacy and call [facility] about the g7.

## 2023-06-25 NOTE — Telephone Encounter (Signed)
 J, Her sugars usually start increasing after approximately 3 PM.  I will assume that that is after lunch.  She will need more insulin  for that meal -please advise her to increase the dose by 2 to 4 units and she may need even higher doses if not enough.  I would not change the rest of the doses for now.  Please make sure that she is taking this insulin  not when the sugars are already high, but approximately 15 minutes before the meal.

## 2023-06-26 DIAGNOSIS — G894 Chronic pain syndrome: Secondary | ICD-10-CM | POA: Diagnosis not present

## 2023-06-26 DIAGNOSIS — M797 Fibromyalgia: Secondary | ICD-10-CM | POA: Diagnosis not present

## 2023-06-26 DIAGNOSIS — K5900 Constipation, unspecified: Secondary | ICD-10-CM | POA: Diagnosis not present

## 2023-06-26 DIAGNOSIS — M47812 Spondylosis without myelopathy or radiculopathy, cervical region: Secondary | ICD-10-CM | POA: Diagnosis not present

## 2023-07-02 ENCOUNTER — Ambulatory Visit (INDEPENDENT_AMBULATORY_CARE_PROVIDER_SITE_OTHER): Admitting: Internal Medicine

## 2023-07-02 ENCOUNTER — Encounter: Payer: Self-pay | Admitting: Internal Medicine

## 2023-07-02 VITALS — BP 136/80 | HR 87 | Ht 64.0 in | Wt 174.2 lb

## 2023-07-02 DIAGNOSIS — E139 Other specified diabetes mellitus without complications: Secondary | ICD-10-CM

## 2023-07-02 DIAGNOSIS — G63 Polyneuropathy in diseases classified elsewhere: Secondary | ICD-10-CM

## 2023-07-02 DIAGNOSIS — E785 Hyperlipidemia, unspecified: Secondary | ICD-10-CM | POA: Diagnosis not present

## 2023-07-02 LAB — POCT GLYCOSYLATED HEMOGLOBIN (HGB A1C): Hemoglobin A1C: 8 % — AB (ref 4.0–5.6)

## 2023-07-02 NOTE — Progress Notes (Signed)
 Patient ID: [name], female   DOB: [birth year], 68 y.o.   MRN: [id number]  HPI: [name] is a 68 y.o.-year-old female, initially referred by her PCP, Dr. Norleen General (PA: Morene Sous), returning for f/u for LADA, dx 2005, insulin -dependent since ~2006, controlled, with complications (PN, gastroparesis?).  Last visit 3 months ago.  She is here with her husband who offers part of the history especially regarding her insulin  doses and diet.  Interim history: She has bipolar disease and also has short-term memory loss, tension headaches, disequilibrium for which she sees neurology.  She also has anxiety and sees a Veterinary surgeon.   Before last visit she switched from Tresiba  to Toujeo  and she felt that the sugars were better on this. Since this visit, especially more recently she is doing better with her diet. She just changed her Dexcom sensor to the G7 version.  Reviewed history: She was on an Omnipod insulin  pump on 08/01/2015. She had severe anxiety and depression and got so overwhelmed with the insulin  pump that we had to take her off the pump.  We switched her to a basal-bolus insulin  regimen, but she was unable to calculate the insulin  doses based on insulin  to carb ratio, so now she is using fixed rapid acting insulin  doses.  Reviewed HbA1c levels: Lab Results  Component Value Date   HGBA1C 8.5 (A) 01/28/2023   HGBA1C 8.0 (A) 09/11/2022   HGBA1C 7.2 (A) 05/07/2022  05/07/2022: HbA1c calculated from fructosamine is 8.4%, slightly lower than before. 08/25/2021: HbA1c calculated from fructosamine is 8.6%, higher than before.  She is on: - Metformin  1000 mg with dinner >> 500 mg 2x a day - Lantus  20 units in am and 25 >> 28 units at night >> Tresiba   46 units daily >> Toujeo /Tresiba  50 units daily >> taking 30 units in am and 20 units at night >> Toujeo  30 units 2x a day - NovoLog : >> Lyumjev  12 units for a smaller meal 14 units for a regular meal 16 units for a larger meal -  Novolog  Sliding scale: >> Lyumjev  201-225: + 2 units 225-250: + 3 units 251-275: + 4 units >275: + 5 units If your sugars are <100 and you eat a low carb meal, inject only ~6 units. So, If sugars are <100 before the meals, you still need to take insulin  for that meal. Do not correct sugars <300 at bedtime. If sugars are <70 before a meal, bring the sugar up first, then inject insulin  for the meals.  Pt checks her sugars >4x a day with her Dexcom CGM:   Previously:  Prev.:    Lowest sugar was 32 >>... 80 >> 71 >> 36; she has hypoglycemia awareness in the 50s Highest sugar was HI >> 391 >> HI. She was in the emergency room in 09/2022  after having had blood sugar of 494.  She was given IV insulin  and hydrated and discharged.  No signs of DKA. At today's visit, CBG 432.  They mention that she started to have high blood sugars approximately 3 days ago.  She does mention several panic attacks in this period.  No CKD, last BUN/creatinine:  06/13/2023: 18/0.7, GFR 93, Glu 282 Lab Results  Component Value Date   BUN 34 (H) 01/26/2023   CREATININE 0.70 01/26/2023   No microalbuminuria: Lab Results  Component Value Date   MICRALBCREAT 17 01/28/2023    + Dyslipidemia: 06/13/2023: 108/85/51/40 Lab Results  Component Value Date   CHOL 203 (H)  01/28/2023   HDL 55 01/28/2023   LDLCALC 100 (H) 01/28/2023   TRIG 357 (H) 01/28/2023   CHOLHDL 3.7 01/28/2023  She was started on Crestor  10 mg daily - but taken off >> now restarted after the above labs: 5 mg daily, on Ezetimibe now.  -Last eye exam was 07/2022: No DR reportedly, but dry eyes >> Dr Octavia. Had cataract sx's.  -+ Numbness and tingling in her feet.  Also, occasional pain.  She was on Neurontin , but now only on nortriptyline .  These are refilled by PCP.  She is also on alpha-lipoic acid.  She sees a podiatrist [facility]).  She had 2 toenails removed in the past due to fungal infection.  Last foot exam  01/28/2023.  Latest TSH was normal: 06/13/2023: TSH 1.75 07/30/2022: TSH 1.13 Lab Results  Component Value Date   TSH 0.533 03/03/2020   06/13/2023:   am cortisol 17.7 B12 1287 [phone]) Vitamin D  38.6  Pt has a h/o admission for Li toxicity 04/2012.  She has a diagnosis of Parkinson's disease.  She also has bipolar disease. Also: GERD, HTN, fibromyalgia. She continues to have headaches and posterior neck pain/mobility.  She was referred to neurology/neurosurgery.  She could not be evaluated until she stopped Xanax .  However, after stopping Xanax , she presented to the emergency room 01/26/2023 for high blood sugars, in the 400s, after having stopped Xanax  and in the setting of high anxiety.  DKA and HHS were ruled out.  She was given insulin  and sugars decreased to the 300s.  She was started on hydroxyzine  to help with anxiety and she started to feel better.. She also had an appointment with her psychiatrist and her anxiety medications were adjusted.As of now, she is on a pain medication patch.  ROS: + See HPI  I reviewed pt's medications, allergies, PMH, social hx, family hx, and changes were documented in the history of present illness. Otherwise, unchanged from my initial visit note.  Past Medical History:  Diagnosis Date   Anxiety    Bipolar 1 disorder (HCC)    Breast density 06/25/2012   Right breast density, will get mammogram and US    Depression    Diabetes mellitus without complication (HCC)    type 1   Duodenitis    Fibromyalgia    GERD (gastroesophageal reflux disease)    History of kidney stones    HTN (hypertension)    Hyperlipidemia    IBS (irritable bowel syndrome)    Neuropathy    Sepsis (HCC) 02/27/2019   Sepsis (HCC) 2023   Past Surgical History:  Procedure Laterality Date   ABDOMINAL HYSTERECTOMY     partial   CHOLECYSTECTOMY     COLONOSCOPY     CYSTOSCOPY WITH RETROGRADE PYELOGRAM, URETEROSCOPY AND STENT PLACEMENT Right 03/31/2019   Procedure:  CYSTOSCOPY WITH RETROGRADE PYELOGRAM, URETEROSCOPY AND STENT PLACEMENT;  Surgeon: Elisabeth Valli BIRCH, MD;  Location: [facility] [place];  Service: Urology;  Laterality: Right;   CYSTOSCOPY WITH STENT PLACEMENT Right 03/02/2019   Procedure: CYSTOSCOPY WITH STENT PLACEMENT;  Surgeon: Elisabeth Valli BIRCH, MD;  Location: [facility] OR;  Service: Urology;  Laterality: Right;   HOLMIUM LASER APPLICATION Right 03/31/2019   Procedure: HOLMIUM LASER APPLICATION;  Surgeon: Elisabeth Valli BIRCH, MD;  Location: [facility];  Service: Urology;  Laterality: Right;   UPPER GASTROINTESTINAL ENDOSCOPY     Social History   Socioeconomic History   Marital status: Married    Spouse name: phillip   Number  of children: 1   Years of education: 12   Highest education level: High school graduate  Occupational History   Occupation: Disabled  Tobacco Use   Smoking status: Never   Smokeless tobacco: Never  Vaping Use   Vaping status: Some Days   Substances: Flavoring  Substance and Sexual Activity   Alcohol use: No    Alcohol/week: 0.0 standard drinks of alcohol   Drug use: No   Sexual activity: Not Currently  Other Topics Concern   Not on file  Social History Narrative   Lives at home w/ her husband   Left-handed   Caffeine: 1-2 cups of coffee daily   Social Drivers of Corporate investment banker Strain: Low Risk  (12/07/2016)   Overall Financial Resource Strain (CARDIA)    Difficulty of Paying Living Expenses: Not hard at all  Food Insecurity: No Food Insecurity (12/07/2016)   Hunger Vital Sign    Worried About Running Out of Food in the Last Year: Never true    Ran Out of Food in the Last Year: Never true  Transportation Needs: Unmet Transportation Needs (12/07/2016)   PRAPARE - Transportation    Lack of Transportation (Medical): Yes    Lack of Transportation (Non-Medical): Yes  Physical Activity: Inactive (12/07/2016)   Exercise Vital Sign    Days of Exercise per Week: 0 days    Minutes  of Exercise per Session: 0 min  Stress: Not on file  Social Connections: Somewhat Isolated (12/07/2016)   Social Connection and Isolation Panel    Frequency of Communication with Friends and Family: Never    Frequency of Social Gatherings with Friends and Family: Never    Attends Religious Services: More than 4 times per year    Active Member of [employer] or Organizations: No    Attends Banker Meetings: Never    Marital Status: Married  Catering manager Violence: Not At Risk (12/07/2016)   Humiliation, Afraid, Rape, and Kick questionnaire    Fear of Current or Ex-Partner: No    Emotionally Abused: No    Physically Abused: No    Sexually Abused: No   Current Outpatient Medications on File Prior to Visit  Medication Sig Dispense Refill   acetaminophen  (TYLENOL ) 500 MG tablet Take 1,000 mg by mouth every 6 (six) hours as needed for mild pain or headache.     ALPRAZolam  (XANAX ) 1 MG tablet Take 1 mg by mouth 4 (four) times daily as needed for anxiety.      AMBULATORY NON FORMULARY MEDICATION Squatty Potty x 1 1 each 0   amitriptyline (ELAVIL) 25 MG tablet Take 25 mg by mouth at bedtime.     amLODipine  (NORVASC ) 5 MG tablet Take 5 mg by mouth daily.     aspirin  EC 81 MG tablet Take 1 tablet (81 mg total) by mouth daily. Swallow whole. 30 tablet 11   BD PEN NEEDLE NANO U/F 32G X 4 MM MISC USE WITH INSULIN  PEN FOUR TIMES A DAY 400 each 3   BENICAR 40 MG tablet Take 40 mg by mouth daily.     busPIRone  (BUSPAR ) 10 MG tablet Take 10 mg by mouth in the morning and at bedtime.     Butalbital-APAP-Caffeine 50-325-40 MG capsule Take 1 capsule by mouth 4 (four) times daily as needed.     cholecalciferol  (VITAMIN D3) 25 MCG (1000 UNIT) tablet Take 1,000 Units by mouth daily.     Continuous Blood Gluc Receiver (DEXCOM G6 RECEIVER) DEVI Use as  instructed to check blood sugar. 1 each 0   Continuous Glucose Sensor (DEXCOM G7 SENSOR) MISC Use to check glucose continuously, change sensor every 10  days 9 each 3   Continuous Glucose Transmitter (DEXCOM G6 TRANSMITTER) MISC Use as instructed to check blood sugar. Change every 90 days 1 each 0   Docusate Sodium  (COLACE PO) Take 100 mg by mouth daily.     esomeprazole (NEXIUM) 40 MG capsule Take 40 mg by mouth 2 (two) times daily.     ezetimibe (ZETIA) 10 MG tablet Take 10 mg by mouth daily.     Glucagon  (BAQSIMI  ONE PACK) 3 MG/DOSE POWD Use 1 spray in the nose as needed for hypoglycemia 1 each PRN   glucose blood (FREESTYLE LITE) test strip USE TO CHECK BLOOD SUGAR 6 TIMES PER DAY 600 each 3   hydrOXYzine  (ATARAX ) 25 MG tablet Take 2 tablets (50 mg total) by mouth every 8 (eight) hours as needed. 18 tablet 0   insulin  glargine, 1 Unit Dial, (TOUJEO  SOLOSTAR) 300 UNIT/ML Solostar Pen Inject 50 Units into the skin daily. 18 mL 3   Insulin  Lispro-aabc (LYUMJEV  KWIKPEN) 200 UNIT/ML KwikPen Inject 13-18 Units into the skin 3 (three) times daily before meals. 18 mL 3   Insulin  Syringes, Disposable, U-100 1 ML MISC To use for insulin  injection.. 100 each 1   Lancets (FREESTYLE) lancets USE TO TEST BLOOD SUGAR 4 TO 6 TIMES DAILY AS INSTRUCTED 300 each 6   losartan  (COZAAR ) 25 MG tablet Take 25 mg by mouth daily.     metFORMIN  (GLUCOPHAGE ) 500 MG tablet TAKE 1 TABLET TWICE A DAY WITH MEALS 180 tablet 3   methocarbamol (ROBAXIN) 500 MG tablet Take 500 mg by mouth 4 (four) times daily as needed.     nortriptyline  (PAMELOR ) 50 MG capsule Take 50 mg by mouth at bedtime.      ondansetron  (ZOFRAN -ODT) 4 MG disintegrating tablet PLACE 1 TO 2 TABLETS ON TONGUE EVERY 4 TO 6 HOURS AS NEEDED FOR NAUSEA (Patient taking differently: Take 4-8 mg by mouth every 4 (four) hours as needed for nausea or vomiting.) 180 tablet 4   OSCIMIN  0.125 MG SUBL DISSOLVE 1 TABLET UNDER THE TONGUE EVERY 4 HOURS AS NEEDED (NEED APPOINTMENT) 360 tablet 0   pregabalin  (LYRICA ) 50 MG capsule START WITH TWICE DAILY. IF NO SIDE EFFECTS IN 1 WEEK MAY INCREASE TO 3X DAILY.*STOP GABAPENTIN * 90  capsule 2   Probiotic CAPS Take 1 capsule by mouth daily. 30 capsule 1   Propylene Glycol (SYSTANE BALANCE OP) Place 1 drop into both eyes daily as needed (dry eyes).     rosuvastatin  (CRESTOR ) 5 MG tablet Take 1 tablet (5 mg total) by mouth daily. 90 tablet 3   traZODone  (DESYREL ) 100 MG tablet 1  qhs  May increse to 2  qhs  After 1 week (Patient taking differently: Take 100 mg by mouth at bedtime. 1  qhs  May increse to 2  qhs  After 1 week) 180 tablet 1   venlafaxine  (EFFEXOR ) 37.5 MG tablet Take 37.5 mg by mouth daily.     vitamin B-12 (CYANOCOBALAMIN ) 1000 MCG tablet Take 1,000 mcg by mouth daily.     Vitamin D , Ergocalciferol , (DRISDOL) 1.25 MG (50000 UNIT) CAPS capsule Take 50,000 Units by mouth once a week.     Current Facility-Administered Medications on File Prior to Visit  Medication Dose Route Frequency Provider Last Rate Last Admin   bupivacaine  (MARCAINE ) 0.5 % (with pres) injection 1 mL  1 mL Infiltration Once Ines Onetha NOVAK, MD       lidocaine  (XYLOCAINE ) 2 % (with pres) injection 20 mg  1 mL Intradermal Once Ines Onetha NOVAK, MD       methylPREDNISolone  acetate (DEPO-MEDROL ) injection 80 mg  80 mg Intramuscular Once Ines Onetha NOVAK, MD       Allergies  Allergen Reactions   Abilify  [Aripiprazole ]     Patient is intolerant   Lantus  [Insulin  Glargine] Other (See Comments)    Elevated blood sugars   Phenergan  [Promethazine ] Diarrhea and Nausea And Vomiting   Reglan [Metoclopramide] Other (See Comments)    Chest pains   Family History  Problem Relation Age of Onset   Hypertension Mother    Bipolar disorder Mother    Cancer Mother    Depression Father    Cancer Father    Thyroid  disease Sister    Hypertension Sister    Bipolar disorder Sister    Heart disease Brother    Bipolar disorder Maternal Aunt    Bipolar disorder Daughter    Colon cancer Neg Hx    Colon polyps Neg Hx    Esophageal cancer Neg Hx    Kidney disease Neg Hx    Gallbladder disease Neg Hx     Diabetes Neg Hx    Dementia Neg Hx    Tremor Neg Hx    Seizures Neg Hx    Migraines Neg Hx    Headache Neg Hx    PE: BP 136/80   Pulse 87   Ht 5' 4 (1.626 m)   Wt 174 lb 3.2 oz (79 kg)   SpO2 93%   BMI 29.90 kg/m    Wt Readings from Last 3 Encounters:  07/02/23 174 lb 3.2 oz (79 kg)  04/02/23 174 lb 6.4 oz (79.1 kg)  01/26/23 180 lb (81.6 kg)   Constitutional: overweight, in NAD Eyes:  EOMI, no exophthalmos ENT: no neck masses, no cervical lymphadenopathy Cardiovascular: RRR, no MRG Respiratory: CTA B Musculoskeletal: no deformities Skin:no rashes, + many tattoos Neurological: no tremor with outstretched hands  ASSESSMENT: 1. 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  Component     Latest Ref Rng 07/16/2013  C-Peptide     0.80 - 3.90 ng/mL 1.29  Glucose     70 - 99 mg/dL 816 (H)  Glutamic Acid Decarb Ab     <=1.0 U/mL 11.3 (H)  Pancreatic Islet Cell Antibody     <5 JDF Units 5 (A)  + anti-pancreatic antibodies >> LADA rather than type 2 DM. She still has a positive C peptide >> still has insulin  secretion.  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 HL  PLAN:  1.  Patient with poorly controlled LADA, difficult to manage due to depression/anxiety/GI symptoms.  Her sugars are usually fluctuating between 30s and 400s.  At last visit, sugars appeared to be slightly better, dropping now to the normal range in the first part of the day but increasing significantly after lunch and remaining elevated with higher blood sugars between 3 AM and 9 AM.  She mentions that switching to Toujeo  and increasing the dose several days prior to the visit helped the blood sugars but upon questioning, she was still using lower doses of Lyumjev  than recommended so we discussed about increasing the doses and how to vary the dose for smaller, regular, and larger meals.  Since  last visit,  she contacted me with still higher blood sugars and reviewing her CGM tracings, these were particularly higher after lunch so I advised her to increase the dose of insulin  with lunch. - HbA1c at last visit was slightly higher, at 8.5% CGM interpretation: -At today's visit, we reviewed her CGM downloads: It appears that 31% of values are in target range (goal >70%), while 68% are higher than 180 (goal <25%), and 1% are lower than 70 (goal <4%).  The calculated average blood sugar is 220.  The projected HbA1c for the next 3 months (GMI) is 8.6%. -Reviewing the CGM trends, sugars are still elevated, remaining higher than target for the majority of the day, only improving slightly 12-3 pm.  Overall, the sugars appear slightly better.  She attributes this to improving her diet and also doing a better job bolusing more appropriately before meals.  Regarding the steep increase in blood sugars after 3 PM, it appears that this corresponds to a snack, for example yogurt and strawberries.  She is not bolusing insulin  for snacks.  At today's visit we discussed about trying to use a lower dose of insulin , for example 8/10 units before such snacks.  She agrees to do so.  Otherwise, in the setting of the improved HbA1c (see below), I advised her to continue the same regimen.  I also encouraged her to start exercising, as she mentions that she has a treadmill at home. - I suggested to:  Patient Instructions  Please use the following regimen: - Metformin  500 mg 2x a day with meals - Toujeo  30 units 2x a day - Lyumjev :   8-10 units for a snack 12 units for a smaller meal 14 units for a regular meal 16 units for a larger meal - Lyumjev  Sliding scale: 201-225: + 2 units 225-250: + 3 units 251-275: + 4 units >275: + 5 units If your sugars are <100 and you eat a low carb meal, inject only ~10 units. So, If sugars are <100 before the meals, you still need to take insulin  for that meal. Do not correct  sugars <300 at bedtime. If sugars are <70 before a meal, bring the sugar up first, then inject insulin  for the meals. Allow the sugars to drop to 80 before you start correcting them.  Please return in 3 months.  - we checked her HbA1c: 8% (better) - advised to check sugars at different times of the day - 4x a day, rotating check times - advised for yearly eye exams >> she is UTD - return to clinic in 3 months  2. PN  -Related to diabetes -On nortriptyline  and Lyrica ; she was on Neurontin  in the past but this was tapered off -On alpha lipoic acid 600 mg twice a day; also on Nervine Roll On - She had physical therapy for this the past  3. HL - Lipid panel was reviewed from 06/2023: Her lipid panel significantly improved from before -She is on Crestor  5 mg daily and Zetia 10 mg daily with good tolerance  Lela Fendt, MD PhD [facility]

## 2023-07-02 NOTE — Addendum Note (Signed)
 Addended by: Melissaann Dizdarevic K on: 07/02/2023 04:39 PM   Modules accepted: Orders

## 2023-07-02 NOTE — Patient Instructions (Addendum)
 Please use the following regimen: - Metformin  500 mg 2x a day with meals - Toujeo  30 units 2x a day - Lyumjev :   8-10 units for a snack 12 units for a smaller meal 14 units for a regular meal 16 units for a larger meal - Lyumjev  Sliding scale: 201-225: + 2 units 225-250: + 3 units 251-275: + 4 units >275: + 5 units If your sugars are <100 and you eat a low carb meal, inject only ~10 units. So, If sugars are <100 before the meals, you still need to take insulin  for that meal. Do not correct sugars <300 at bedtime. If sugars are <70 before a meal, bring the sugar up first, then inject insulin  for the meals. Allow the sugars to drop to 80 before you start correcting them.  Please return in 3 months.

## 2023-07-04 DIAGNOSIS — B078 Other viral warts: Secondary | ICD-10-CM | POA: Diagnosis not present

## 2023-07-04 DIAGNOSIS — L538 Other specified erythematous conditions: Secondary | ICD-10-CM | POA: Diagnosis not present

## 2023-07-04 DIAGNOSIS — L568 Other specified acute skin changes due to ultraviolet radiation: Secondary | ICD-10-CM | POA: Diagnosis not present

## 2023-07-17 DIAGNOSIS — F3181 Bipolar II disorder: Secondary | ICD-10-CM | POA: Diagnosis not present

## 2023-07-17 DIAGNOSIS — F411 Generalized anxiety disorder: Secondary | ICD-10-CM | POA: Diagnosis not present

## 2023-07-23 DIAGNOSIS — M797 Fibromyalgia: Secondary | ICD-10-CM | POA: Diagnosis not present

## 2023-07-23 DIAGNOSIS — M47812 Spondylosis without myelopathy or radiculopathy, cervical region: Secondary | ICD-10-CM | POA: Diagnosis not present

## 2023-07-23 DIAGNOSIS — K5900 Constipation, unspecified: Secondary | ICD-10-CM | POA: Diagnosis not present

## 2023-07-23 DIAGNOSIS — G894 Chronic pain syndrome: Secondary | ICD-10-CM | POA: Diagnosis not present

## 2023-08-10 ENCOUNTER — Other Ambulatory Visit: Payer: Self-pay | Admitting: Internal Medicine

## 2023-08-12 ENCOUNTER — Encounter: Payer: Self-pay | Admitting: Internal Medicine

## 2023-08-12 MED ORDER — LYUMJEV KWIKPEN 200 UNIT/ML ~~LOC~~ SOPN: 13.0000 [IU] | PEN_INJECTOR | Freq: Three times a day (TID) | SUBCUTANEOUS | 0 refills | Status: DC

## 2023-08-12 MED ORDER — LYUMJEV KWIKPEN 200 UNIT/ML ~~LOC~~ SOPN: 13.0000 [IU] | PEN_INJECTOR | Freq: Three times a day (TID) | SUBCUTANEOUS | 3 refills | Status: DC

## 2023-08-12 NOTE — Addendum Note (Signed)
 Addended by: CLEOTILDE ROLIN RAMAN on: 08/12/2023 10:02 AM   Modules accepted: Orders

## 2023-08-15 DIAGNOSIS — B078 Other viral warts: Secondary | ICD-10-CM | POA: Diagnosis not present

## 2023-08-15 DIAGNOSIS — F424 Excoriation (skin-picking) disorder: Secondary | ICD-10-CM | POA: Diagnosis not present

## 2023-08-19 ENCOUNTER — Other Ambulatory Visit: Payer: Self-pay | Admitting: Internal Medicine

## 2023-08-20 DIAGNOSIS — G894 Chronic pain syndrome: Secondary | ICD-10-CM | POA: Diagnosis not present

## 2023-08-20 DIAGNOSIS — M797 Fibromyalgia: Secondary | ICD-10-CM | POA: Diagnosis not present

## 2023-08-20 DIAGNOSIS — M47812 Spondylosis without myelopathy or radiculopathy, cervical region: Secondary | ICD-10-CM | POA: Diagnosis not present

## 2023-08-20 DIAGNOSIS — K5900 Constipation, unspecified: Secondary | ICD-10-CM | POA: Diagnosis not present

## 2023-08-29 DIAGNOSIS — E1140 Type 2 diabetes mellitus with diabetic neuropathy, unspecified: Secondary | ICD-10-CM | POA: Diagnosis not present

## 2023-08-29 DIAGNOSIS — F3110 Bipolar disorder, current episode manic without psychotic features, unspecified: Secondary | ICD-10-CM | POA: Diagnosis not present

## 2023-08-29 DIAGNOSIS — G4700 Insomnia, unspecified: Secondary | ICD-10-CM | POA: Diagnosis not present

## 2023-08-29 DIAGNOSIS — Z6828 Body mass index (BMI) 28.0-28.9, adult: Secondary | ICD-10-CM | POA: Diagnosis not present

## 2023-08-29 DIAGNOSIS — F419 Anxiety disorder, unspecified: Secondary | ICD-10-CM | POA: Diagnosis not present

## 2023-08-29 DIAGNOSIS — I10 Essential (primary) hypertension: Secondary | ICD-10-CM | POA: Diagnosis not present

## 2023-08-29 DIAGNOSIS — G629 Polyneuropathy, unspecified: Secondary | ICD-10-CM | POA: Diagnosis not present

## 2023-08-29 DIAGNOSIS — E1165 Type 2 diabetes mellitus with hyperglycemia: Secondary | ICD-10-CM | POA: Diagnosis not present

## 2023-08-29 DIAGNOSIS — E6609 Other obesity due to excess calories: Secondary | ICD-10-CM | POA: Diagnosis not present

## 2023-08-29 DIAGNOSIS — E1159 Type 2 diabetes mellitus with other circulatory complications: Secondary | ICD-10-CM | POA: Diagnosis not present

## 2023-08-30 ENCOUNTER — Other Ambulatory Visit: Payer: Self-pay | Admitting: Internal Medicine

## 2023-08-30 DIAGNOSIS — E139 Other specified diabetes mellitus without complications: Secondary | ICD-10-CM

## 2023-08-30 NOTE — Telephone Encounter (Signed)
 Patient's spouse picked up information and advised that they would like to start with a visit with Leita   Call back #[phone]

## 2023-09-04 DIAGNOSIS — R350 Frequency of micturition: Secondary | ICD-10-CM | POA: Diagnosis not present

## 2023-09-04 DIAGNOSIS — R351 Nocturia: Secondary | ICD-10-CM | POA: Diagnosis not present

## 2023-09-15 ENCOUNTER — Other Ambulatory Visit: Payer: Self-pay | Admitting: Internal Medicine

## 2023-09-15 DIAGNOSIS — E139 Other specified diabetes mellitus without complications: Secondary | ICD-10-CM

## 2023-09-16 DIAGNOSIS — F424 Excoriation (skin-picking) disorder: Secondary | ICD-10-CM | POA: Diagnosis not present

## 2023-09-16 DIAGNOSIS — M47812 Spondylosis without myelopathy or radiculopathy, cervical region: Secondary | ICD-10-CM | POA: Diagnosis not present

## 2023-09-16 DIAGNOSIS — K5900 Constipation, unspecified: Secondary | ICD-10-CM | POA: Diagnosis not present

## 2023-09-16 DIAGNOSIS — L811 Chloasma: Secondary | ICD-10-CM | POA: Diagnosis not present

## 2023-09-16 DIAGNOSIS — M797 Fibromyalgia: Secondary | ICD-10-CM | POA: Diagnosis not present

## 2023-09-16 DIAGNOSIS — G894 Chronic pain syndrome: Secondary | ICD-10-CM | POA: Diagnosis not present

## 2023-09-16 NOTE — Telephone Encounter (Signed)
 Refill request complete

## 2023-09-23 DIAGNOSIS — F419 Anxiety disorder, unspecified: Secondary | ICD-10-CM | POA: Diagnosis not present

## 2023-09-23 DIAGNOSIS — Z6830 Body mass index (BMI) 30.0-30.9, adult: Secondary | ICD-10-CM | POA: Diagnosis not present

## 2023-09-23 DIAGNOSIS — I10 Essential (primary) hypertension: Secondary | ICD-10-CM | POA: Diagnosis not present

## 2023-09-23 DIAGNOSIS — R110 Nausea: Secondary | ICD-10-CM | POA: Diagnosis not present

## 2023-09-23 DIAGNOSIS — G894 Chronic pain syndrome: Secondary | ICD-10-CM | POA: Diagnosis not present

## 2023-09-23 DIAGNOSIS — Z7689 Persons encountering health services in other specified circumstances: Secondary | ICD-10-CM | POA: Diagnosis not present

## 2023-09-23 DIAGNOSIS — Z79899 Other long term (current) drug therapy: Secondary | ICD-10-CM | POA: Diagnosis not present

## 2023-09-23 DIAGNOSIS — E1065 Type 1 diabetes mellitus with hyperglycemia: Secondary | ICD-10-CM | POA: Diagnosis not present

## 2023-09-23 DIAGNOSIS — E669 Obesity, unspecified: Secondary | ICD-10-CM | POA: Diagnosis not present

## 2023-09-23 DIAGNOSIS — Z5320 Procedure and treatment not carried out because of patient's decision for unspecified reasons: Secondary | ICD-10-CM | POA: Diagnosis not present

## 2023-09-23 DIAGNOSIS — F32A Depression, unspecified: Secondary | ICD-10-CM | POA: Diagnosis not present

## 2023-09-23 DIAGNOSIS — E782 Mixed hyperlipidemia: Secondary | ICD-10-CM | POA: Diagnosis not present

## 2023-10-03 ENCOUNTER — Encounter: Payer: Self-pay | Admitting: Internal Medicine

## 2023-10-03 ENCOUNTER — Ambulatory Visit (INDEPENDENT_AMBULATORY_CARE_PROVIDER_SITE_OTHER): Admitting: Internal Medicine

## 2023-10-03 VITALS — BP 130/60 | HR 101 | Ht 64.0 in | Wt 172.0 lb

## 2023-10-03 DIAGNOSIS — E785 Hyperlipidemia, unspecified: Secondary | ICD-10-CM

## 2023-10-03 DIAGNOSIS — E139 Other specified diabetes mellitus without complications: Secondary | ICD-10-CM | POA: Diagnosis not present

## 2023-10-03 DIAGNOSIS — G63 Polyneuropathy in diseases classified elsewhere: Secondary | ICD-10-CM | POA: Diagnosis not present

## 2023-10-03 LAB — POCT GLYCOSYLATED HEMOGLOBIN (HGB A1C): Hemoglobin A1C: 7.7 % — AB (ref 4.0–5.6)

## 2023-10-03 MED ORDER — LYUMJEV KWIKPEN 200 UNIT/ML ~~LOC~~ SOPN: 13.0000 [IU] | PEN_INJECTOR | Freq: Three times a day (TID) | SUBCUTANEOUS | 11 refills | Status: DC

## 2023-10-03 NOTE — Progress Notes (Signed)
 Patient ID: [name], female   DOB: Apr 12, 1955, 68 y.o.   MRN: [id number]  HPI: [name] is a 68 y.o.-year-old female, initially referred by her PCP, Dr. Norleen General (PA: Morene Sous), returning for f/u for LADA, dx 2005, insulin -dependent since ~2006, controlled, with complications (PN, gastroparesis?).  Last visit 3 months ago.  She is here with her husband who offers part of the history especially regarding her insulin  doses and diet.  Interim history: She has bipolar disease and also has short-term memory loss, tension headaches, disequilibrium for which she sees neurology.  She also has anxiety and sees a Veterinary surgeon.    Reviewed history: She was on an Omnipod insulin  pump on 08/01/2015. She had severe anxiety and depression and got so overwhelmed with the insulin  pump that we had to take her off the pump.  We switched her to a basal-bolus insulin  regimen, but she was unable to calculate the insulin  doses based on insulin  to carb ratio, so now she is using fixed rapid acting insulin  doses.  Reviewed HbA1c levels: Lab Results  Component Value Date   HGBA1C 8.0 (A) 07/02/2023   HGBA1C 8.5 (A) 01/28/2023   HGBA1C 8.0 (A) 09/11/2022  05/07/2022: HbA1c calculated from fructosamine is 8.4%, slightly lower than before. 08/25/2021: HbA1c calculated from fructosamine is 8.6%, higher than before.  She is on: - Metformin  1000 mg with dinner >> 500 mg 2x a day - Toujeo  30 units 2x a day - Lyumjev     8-10 units for a snack 12 units for a smaller meal 14 units for a regular meal 16 units for a larger meal - Lyumjev  201-225: + 2 units 225-250: + 3 units 251-275: + 4 units >275: + 5 units If your sugars are <100 and you eat a low carb meal, inject only ~6 units. So, If sugars are <100 before the meals, you still need to take insulin  for that meal. Do not correct sugars <300 at bedtime. If sugars are <70 before a meal, bring the sugar up first, then inject insulin  for the  meals. She was previously on Lantus  and Tresiba .  She feels she does better on Toujeo  than on Tresiba   Pt checks her sugars >4x a day with her Dexcom CGM:  Previously:   Previously:  Lowest sugar was 32 >>... 71 >> 36 >> 39; she has hypoglycemia awareness in the 50s Highest sugar was HI >> 391 >> HI >> 400s She was in the emergency room in 09/2022  after having had blood sugar of 494.  She was given IV insulin  and hydrated and discharged.  No signs of DKA. At today's visit, CBG 432.  They mention that she started to have high blood sugars approximately 3 days ago.  She does mention several panic attacks in this period.  No CKD, last BUN/creatinine:  06/13/2023: 18/0.7, GFR 93, Glu 282 Lab Results  Component Value Date   BUN 34 (H) 01/26/2023   CREATININE 0.70 01/26/2023   No microalbuminuria: Lab Results  Component Value Date   MICRALBCREAT 17 01/28/2023    + Dyslipidemia: 06/13/2023: 108/85/51/40 Lab Results  Component Value Date   CHOL 203 (H) 01/28/2023   HDL 55 01/28/2023   LDLCALC 100 (H) 01/28/2023   TRIG 357 (H) 01/28/2023   CHOLHDL 3.7 01/28/2023  She was started on Crestor  10 mg daily - but taken off >> now restarted after the above labs: 5 mg daily, on Ezetimibe now.  -Last eye exam was 07/2022: No DR  reportedly, but dry eyes >> Dr Octavia. Had cataract sx's.  -+ Numbness and tingling in her feet.  Also, occasional pain.  She was on Neurontin , but now only on nortriptyline .  These are refilled by PCP.  She is also on alpha-lipoic acid.  She sees a podiatrist [facility]).  She had 2 toenails removed in the past due to fungal infection.  Last foot exam 01/28/2023.  Latest TSH was normal: 06/13/2023: TSH 1.75 07/30/2022: TSH 1.13 Lab Results  Component Value Date   TSH 0.533 03/03/2020   06/13/2023:   am cortisol 17.7 B12 1287 ([phone]) Vitamin D  38.6  Pt has a h/o admission for Li toxicity 04/2012.  She has a diagnosis of Parkinson's disease.   She also has bipolar disease. Also: GERD, HTN, fibromyalgia. She continues to have headaches and posterior neck pain/mobility.  She was referred to neurology/neurosurgery.  She could not be evaluated until she stopped Xanax .  However, after stopping Xanax , she presented to the emergency room 01/26/2023 for high blood sugars, in the 400s, after having stopped Xanax  and in the setting of high anxiety.  DKA and HHS were ruled out.  She was given insulin  and sugars decreased to the 300s.  She was started on hydroxyzine  to help with anxiety and she started to feel better.. She also had an appointment with her psychiatrist and her anxiety medications were adjusted.As of now, she is on a pain medication patch.  ROS: + See HPI  I reviewed pt's medications, allergies, PMH, social hx, family hx, and changes were documented in the history of present illness. Otherwise, unchanged from my initial visit note.  Past Medical History:  Diagnosis Date   Anxiety    Bipolar 1 disorder (HCC)    Breast density 06/25/2012   Right breast density, will get mammogram and US    Depression    Diabetes mellitus without complication (HCC)    type 1   Duodenitis    Fibromyalgia    GERD (gastroesophageal reflux disease)    History of kidney stones    HTN (hypertension)    Hyperlipidemia    IBS (irritable bowel syndrome)    Neuropathy    Sepsis (HCC) 02/27/2019   Sepsis (HCC) 2023   Past Surgical History:  Procedure Laterality Date   ABDOMINAL HYSTERECTOMY     partial   CHOLECYSTECTOMY     COLONOSCOPY     CYSTOSCOPY WITH RETROGRADE PYELOGRAM, URETEROSCOPY AND STENT PLACEMENT Right 03/31/2019   Procedure: CYSTOSCOPY WITH RETROGRADE PYELOGRAM, URETEROSCOPY AND STENT PLACEMENT;  Surgeon: Elisabeth Valli BIRCH, MD;  Location: [facility];  Service: Urology;  Laterality: Right;   CYSTOSCOPY WITH STENT PLACEMENT Right 03/02/2019   Procedure: CYSTOSCOPY WITH STENT PLACEMENT;  Surgeon: Elisabeth Valli BIRCH, MD;   Location: [facility] OR;  Service: Urology;  Laterality: Right;   HOLMIUM LASER APPLICATION Right 03/31/2019   Procedure: HOLMIUM LASER APPLICATION;  Surgeon: Elisabeth Valli BIRCH, MD;  Location: [facility];  Service: Urology;  Laterality: Right;   UPPER GASTROINTESTINAL ENDOSCOPY     Social History   Socioeconomic History   Marital status: Married    Spouse name: phillip   Number of children: 1   Years of education: 12   Highest education level: High school graduate  Occupational History   Occupation: Disabled  Tobacco Use   Smoking status: Never   Smokeless tobacco: Never  Vaping Use   Vaping status: Some Days   Substances: Flavoring  Substance and Sexual Activity  Alcohol use: No    Alcohol/week: 0.0 standard drinks of alcohol   Drug use: No   Sexual activity: Not Currently  Other Topics Concern   Not on file  Social History Narrative   Lives at home w/ her husband   Left-handed   Caffeine: 1-2 cups of coffee daily   Social Drivers of Health   Financial Resource Strain: Low Risk  (12/07/2016)   Overall Financial Resource Strain (CARDIA)    Difficulty of Paying Living Expenses: Not hard at all  Food Insecurity: No Food Insecurity (12/07/2016)   Hunger Vital Sign    Worried About Running Out of Food in the Last Year: Never true    Ran Out of Food in the Last Year: Never true  Transportation Needs: Unmet Transportation Needs (12/07/2016)   PRAPARE - Transportation    Lack of Transportation (Medical): Yes    Lack of Transportation (Non-Medical): Yes  Physical Activity: Inactive (12/07/2016)   Exercise Vital Sign    Days of Exercise per Week: 0 days    Minutes of Exercise per Session: 0 min  Stress: Not on file  Social Connections: Somewhat Isolated (12/07/2016)   Social Connection and Isolation Panel    Frequency of Communication with Friends and Family: Never    Frequency of Social Gatherings with Friends and Family: Never    Attends Religious Services: More  than 4 times per year    Active Member of [employer] or Organizations: No    Attends Banker Meetings: Never    Marital Status: Married  Catering manager Violence: Not At Risk (12/07/2016)   Humiliation, Afraid, Rape, and Kick questionnaire    Fear of Current or Ex-Partner: No    Emotionally Abused: No    Physically Abused: No    Sexually Abused: No   Current Outpatient Medications on File Prior to Visit  Medication Sig Dispense Refill   acetaminophen  (TYLENOL ) 500 MG tablet Take 1,000 mg by mouth every 6 (six) hours as needed for mild pain or headache.     ALPRAZolam  (XANAX ) 1 MG tablet Take 1 mg by mouth 4 (four) times daily as needed for anxiety.  (Patient not taking: Reported on 07/02/2023)     AMBULATORY NON FORMULARY MEDICATION Squatty Potty x 1 1 each 0   amitriptyline (ELAVIL) 25 MG tablet Take 25 mg by mouth at bedtime. (Patient not taking: Reported on 07/02/2023)     amLODipine  (NORVASC ) 5 MG tablet Take 5 mg by mouth daily.     aspirin  EC 81 MG tablet Take 1 tablet (81 mg total) by mouth daily. Swallow whole. 30 tablet 11   BD PEN NEEDLE NANO U/F 32G X 4 MM MISC USE WITH INSULIN  PEN FOUR TIMES A DAY 400 each 3   BENICAR 40 MG tablet Take 40 mg by mouth daily.     busPIRone  (BUSPAR ) 10 MG tablet Take 10 mg by mouth in the morning and at bedtime.     Butalbital-APAP-Caffeine 50-325-40 MG capsule Take 1 capsule by mouth 4 (four) times daily as needed. (Patient not taking: Reported on 07/02/2023)     cholecalciferol  (VITAMIN D3) 25 MCG (1000 UNIT) tablet Take 1,000 Units by mouth daily.     Continuous Blood Gluc Receiver (DEXCOM G6 RECEIVER) DEVI Use as instructed to check blood sugar. 1 each 0   Continuous Glucose Sensor (DEXCOM G7 SENSOR) MISC Use to check glucose continuously, change sensor every 10 days 9 each 3   Continuous Glucose Transmitter (DEXCOM G6 TRANSMITTER) MISC  USE AS INSTRUCTED TO CHECK BLOOD SUGAR. CHANGE EVERY 90 DAYS 1 each 0   Docusate Sodium  (COLACE PO) Take  100 mg by mouth daily.     esomeprazole (NEXIUM) 40 MG capsule Take 40 mg by mouth 2 (two) times daily.     ezetimibe (ZETIA) 10 MG tablet Take 10 mg by mouth daily.     Glucagon  (BAQSIMI  ONE PACK) 3 MG/DOSE POWD Use 1 spray in the nose as needed for hypoglycemia 1 each PRN   glucose blood (FREESTYLE LITE) test strip USE TO CHECK BLOOD SUGAR 6 TIMES PER DAY 600 each 3   hydrOXYzine  (ATARAX ) 25 MG tablet Take 2 tablets (50 mg total) by mouth every 8 (eight) hours as needed. 18 tablet 0   insulin  glargine, 1 Unit Dial, (TOUJEO  SOLOSTAR) 300 UNIT/ML Solostar Pen Inject 50 Units into the skin daily. 18 mL 3   Insulin  Lispro-aabc (LYUMJEV  KWIKPEN) 200 UNIT/ML KwikPen Inject 13-18 Units into the skin 3 (three) times daily before meals. 6 mL 0   Insulin  Syringes, Disposable, U-100 1 ML MISC To use for insulin  injection.. 100 each 1   Lancets (FREESTYLE) lancets USE TO TEST BLOOD SUGAR 4 TO 6 TIMES DAILY AS INSTRUCTED 300 each 6   losartan  (COZAAR ) 25 MG tablet Take 25 mg by mouth daily.     metFORMIN  (GLUCOPHAGE ) 500 MG tablet TAKE 1 TABLET TWICE A DAY WITH MEALS 180 tablet 1   methocarbamol (ROBAXIN) 500 MG tablet Take 500 mg by mouth 4 (four) times daily as needed.     nortriptyline  (PAMELOR ) 50 MG capsule Take 50 mg by mouth at bedtime.  (Patient not taking: Reported on 07/02/2023)     ondansetron  (ZOFRAN -ODT) 4 MG disintegrating tablet PLACE 1 TO 2 TABLETS ON TONGUE EVERY 4 TO 6 HOURS AS NEEDED FOR NAUSEA (Patient taking differently: Take 4-8 mg by mouth every 4 (four) hours as needed for nausea or vomiting.) 180 tablet 4   OSCIMIN  0.125 MG SUBL DISSOLVE 1 TABLET UNDER THE TONGUE EVERY 4 HOURS AS NEEDED (NEED APPOINTMENT) 360 tablet 0   pregabalin  (LYRICA ) 50 MG capsule START WITH TWICE DAILY. IF NO SIDE EFFECTS IN 1 WEEK MAY INCREASE TO 3X DAILY.*STOP GABAPENTIN * (Patient not taking: Reported on 07/02/2023) 90 capsule 2   Probiotic CAPS Take 1 capsule by mouth daily. 30 capsule 1   Propylene Glycol  (SYSTANE BALANCE OP) Place 1 drop into both eyes daily as needed (dry eyes).     rosuvastatin  (CRESTOR ) 5 MG tablet Take 1 tablet (5 mg total) by mouth daily. 90 tablet 3   traZODone  (DESYREL ) 100 MG tablet 1  qhs  May increse to 2  qhs  After 1 week (Patient taking differently: Take 100 mg by mouth at bedtime. 1  qhs  May increse to 2  qhs  After 1 week) 180 tablet 1   venlafaxine  (EFFEXOR ) 37.5 MG tablet Take 37.5 mg by mouth daily.     vitamin B-12 (CYANOCOBALAMIN ) 1000 MCG tablet Take 1,000 mcg by mouth daily.     Vitamin D , Ergocalciferol , (DRISDOL) 1.25 MG (50000 UNIT) CAPS capsule Take 50,000 Units by mouth once a week.     Current Facility-Administered Medications on File Prior to Visit  Medication Dose Route Frequency Provider Last Rate Last Admin   bupivacaine  (MARCAINE ) 0.5 % (with pres) injection 1 mL  1 mL Infiltration Once Ines Onetha NOVAK, MD       lidocaine  (XYLOCAINE ) 2 % (with pres) injection 20 mg  1 mL  Intradermal Once Ahern, Antonia B, MD       methylPREDNISolone  acetate (DEPO-MEDROL ) injection 80 mg  80 mg Intramuscular Once Ines Onetha NOVAK, MD       Allergies  Allergen Reactions   Abilify  [Aripiprazole ]     Patient is intolerant   Lantus  [Insulin  Glargine] Other (See Comments)    Elevated blood sugars   Phenergan  [Promethazine ] Diarrhea and Nausea And Vomiting   Reglan [Metoclopramide] Other (See Comments)    Chest pains   Family History  Problem Relation Age of Onset   Hypertension Mother    Bipolar disorder Mother    Cancer Mother    Depression Father    Cancer Father    Thyroid  disease Sister    Hypertension Sister    Bipolar disorder Sister    Heart disease Brother    Bipolar disorder Maternal Aunt    Bipolar disorder Daughter    Colon cancer Neg Hx    Colon polyps Neg Hx    Esophageal cancer Neg Hx    Kidney disease Neg Hx    Gallbladder disease Neg Hx    Diabetes Neg Hx    Dementia Neg Hx    Tremor Neg Hx    Seizures Neg Hx    Migraines Neg  Hx    Headache Neg Hx    PE: There were no vitals taken for this visit.   Wt Readings from Last 3 Encounters:  07/02/23 174 lb 3.2 oz (79 kg)  04/02/23 174 lb 6.4 oz (79.1 kg)  01/26/23 180 lb (81.6 kg)   Constitutional: overweight, in NAD Eyes:  EOMI, no exophthalmos ENT: no neck masses, no cervical lymphadenopathy Cardiovascular: RRR, no MRG Respiratory: CTA B Musculoskeletal: no deformities Skin:no rashes, + many tattoos Neurological: no tremor with outstretched hands  ASSESSMENT: 1. 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  Component     Latest Ref Rng 07/16/2013  C-Peptide     0.80 - 3.90 ng/mL 1.29  Glucose     70 - 99 mg/dL 816 (H)  Glutamic Acid Decarb Ab     <=1.0 U/mL 11.3 (H)  Pancreatic Islet Cell Antibody     <5 JDF Units 5 (A)  + anti-pancreatic antibodies >> LADA rather than type 2 DM. She still has a positive C peptide >> still has insulin  secretion.  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 HL  PLAN:  1.  Patient with poorly controlled LADA, difficult to manage due to depression/anxiety/GI symptoms.  Her sugars are usually fluctuating from 30s-40s to 300s-400s.  At last visit, sugars were still elevated, higher than target for the majority of the day, only improving slightly between 12 and 3 PM.  However, overall, they appears to be slightly better than before.  She attributed this improvement to improving her diet and also doing a better job bolusing more appropriately before meals.  Regarding the steep increase in blood sugars after 3 PM, it appeared that this was corresponding to a snack (for example yogurt and strawberries).  She was not bolusing insulin  for snacks.  We discussed about using a lower dose of insulin  before these.  HbA1c appears to be improved, decreased from 8.5% to 8.0%.  Therefore, we did not change the rest of the  insulin  doses.  I again advised her to start exercising - she mentioned that she had a treadmill at home. CGM interpretation: -At today's visit, we  reviewed her CGM downloads: It appears that 27% of values are in target range (goal >70%), while 72% are higher than 180 (goal <25%), and 1% are lower than 70 (goal <4%).  The calculated average blood sugar is 227.  The projected HbA1c for the next 3 months (GMI) is 8.7%. -Reviewing the CGM trends, sugars continue to remain elevated, increasing overnight, peaking in the early morning hours and then dropping more abruptly with a nadir around 3 PM and then increasing blood sugars afterwards, peaking around 8 PM.  She mentions that she is taking her Lyumjev  injections as recommended.  To avoid low blood sugars after breakfast and lunch, I recommended to decrease her Lyumjev  doses before these meals.  Will continue the same dose with dinner since the high blood sugars after dinner may be a consequence of sugars dropping too abruptly before this meal.  I also advised her to take a higher dose of Toujeo  at night and a lower dose in the morning to avoid the higher blood sugars overnight. -We discussed about possibly using the iLet pump - explained the difference between this pump and other pumps.  They agreed to try it. - I suggested to:  Patient Instructions  Please use the following regimen: - Metformin  500 mg 2x a day with meals  Change: - Toujeo  20 units in am and 40 units at night - Lyumjev :   8-10 units for a snack  8-12 units before b'fast and lunch 12-16 units before dinner - Lyumjev  Sliding scale: 201-225: + 2 units 225-250: + 3 units 251-275: + 4 units >275: + 5 units If your sugars are <100 and you eat a low carb meal, inject only ~10 units. So, If sugars are <100 before the meals, you still need to take insulin  for that meal. Do not correct sugars <300 at bedtime. If sugars are <70 before a meal, bring the sugar up first, then inject insulin   for the meals. Allow the sugars to drop to 80 before you start correcting them.  Let's try to start the iLET insulin  pump.  Please return in 3 months.  - we checked her HbA1c: 7.7% (lower) - advised to check sugars at different times of the day - 4x a day, rotating check times - advised for yearly eye exams >> she is not UTD - return to clinic in 3 months  2. PN  -Related to diabetes - On nortriptyline  and Lyrica ; she was on Neurontin  in the past but this was tapered off - On alpha-lipoic acid 600 mg twice a day; also on Nervine Roll On - She had physical therapy for this in the past  3. HL - Latest lipid fractions were reviewed from 06/2023: Lipid panel significantly improved from before - She continues on Crestor  5 mg daily and Zetia 10 mg daily with good tolerance  Lela Fendt, MD PhD [facility]

## 2023-10-03 NOTE — Patient Instructions (Addendum)
 Please use the following regimen: - Metformin  500 mg 2x a day with meals  Change: - Toujeo  20 units in am and 40 units at night - Lyumjev :   8-10 units for a snack  8-12 units before b'fast and lunch 12-16 units before dinner - Lyumjev  Sliding scale: 201-225: + 2 units 225-250: + 3 units 251-275: + 4 units >275: + 5 units If your sugars are <100 and you eat a low carb meal, inject only ~10 units. So, If sugars are <100 before the meals, you still need to take insulin  for that meal. Do not correct sugars <300 at bedtime. If sugars are <70 before a meal, bring the sugar up first, then inject insulin  for the meals. Allow the sugars to drop to 80 before you start correcting them.  Let's try to start the iLET insulin  pump.  Please return in 3 months.

## 2023-10-03 NOTE — Addendum Note (Signed)
 Addended by: CLEOTILDE ROLIN RAMAN on: 10/03/2023 01:25 PM   Modules accepted: Orders

## 2023-10-07 ENCOUNTER — Encounter: Payer: Self-pay | Admitting: Internal Medicine

## 2023-10-07 MED ORDER — LYUMJEV KWIKPEN 200 UNIT/ML ~~LOC~~ SOPN: 13.0000 [IU] | PEN_INJECTOR | Freq: Three times a day (TID) | SUBCUTANEOUS | 2 refills | Status: AC

## 2023-10-21 DIAGNOSIS — K5900 Constipation, unspecified: Secondary | ICD-10-CM | POA: Diagnosis not present

## 2023-10-21 DIAGNOSIS — M47812 Spondylosis without myelopathy or radiculopathy, cervical region: Secondary | ICD-10-CM | POA: Diagnosis not present

## 2023-10-21 DIAGNOSIS — M797 Fibromyalgia: Secondary | ICD-10-CM | POA: Diagnosis not present

## 2023-10-21 DIAGNOSIS — G894 Chronic pain syndrome: Secondary | ICD-10-CM | POA: Diagnosis not present

## 2023-11-04 DIAGNOSIS — J0140 Acute pansinusitis, unspecified: Secondary | ICD-10-CM | POA: Diagnosis not present

## 2023-11-20 DIAGNOSIS — M47812 Spondylosis without myelopathy or radiculopathy, cervical region: Secondary | ICD-10-CM | POA: Diagnosis not present

## 2023-11-20 DIAGNOSIS — G894 Chronic pain syndrome: Secondary | ICD-10-CM | POA: Diagnosis not present

## 2023-11-20 DIAGNOSIS — F411 Generalized anxiety disorder: Secondary | ICD-10-CM | POA: Diagnosis not present

## 2023-11-20 DIAGNOSIS — F3181 Bipolar II disorder: Secondary | ICD-10-CM | POA: Diagnosis not present

## 2023-11-20 DIAGNOSIS — M797 Fibromyalgia: Secondary | ICD-10-CM | POA: Diagnosis not present

## 2023-11-20 DIAGNOSIS — K5900 Constipation, unspecified: Secondary | ICD-10-CM | POA: Diagnosis not present

## 2023-12-09 DIAGNOSIS — H04123 Dry eye syndrome of bilateral lacrimal glands: Secondary | ICD-10-CM | POA: Diagnosis not present

## 2023-12-09 DIAGNOSIS — E119 Type 2 diabetes mellitus without complications: Secondary | ICD-10-CM | POA: Diagnosis not present

## 2023-12-09 DIAGNOSIS — Z961 Presence of intraocular lens: Secondary | ICD-10-CM | POA: Diagnosis not present

## 2023-12-18 DIAGNOSIS — Z7189 Other specified counseling: Secondary | ICD-10-CM | POA: Diagnosis not present

## 2023-12-19 DIAGNOSIS — M47812 Spondylosis without myelopathy or radiculopathy, cervical region: Secondary | ICD-10-CM | POA: Diagnosis not present

## 2023-12-19 DIAGNOSIS — G894 Chronic pain syndrome: Secondary | ICD-10-CM | POA: Diagnosis not present

## 2023-12-19 DIAGNOSIS — K5900 Constipation, unspecified: Secondary | ICD-10-CM | POA: Diagnosis not present

## 2023-12-19 DIAGNOSIS — M797 Fibromyalgia: Secondary | ICD-10-CM | POA: Diagnosis not present

## 2024-01-01 ENCOUNTER — Ambulatory Visit

## 2024-01-03 ENCOUNTER — Encounter: Payer: Self-pay | Admitting: Internal Medicine

## 2024-01-03 ENCOUNTER — Ambulatory Visit: Admitting: Internal Medicine

## 2024-01-03 ENCOUNTER — Ambulatory Visit (INDEPENDENT_AMBULATORY_CARE_PROVIDER_SITE_OTHER): Admitting: Internal Medicine

## 2024-01-03 VITALS — BP 118/60 | HR 108 | Ht 64.0 in | Wt 178.0 lb

## 2024-01-03 DIAGNOSIS — G63 Polyneuropathy in diseases classified elsewhere: Secondary | ICD-10-CM

## 2024-01-03 DIAGNOSIS — E785 Hyperlipidemia, unspecified: Secondary | ICD-10-CM | POA: Diagnosis not present

## 2024-01-03 DIAGNOSIS — E139 Other specified diabetes mellitus without complications: Secondary | ICD-10-CM | POA: Diagnosis not present

## 2024-01-03 LAB — POCT GLYCOSYLATED HEMOGLOBIN (HGB A1C): Hemoglobin A1C: 7.6 % — AB (ref 4.0–5.6)

## 2024-01-03 NOTE — Progress Notes (Signed)
 Patient ID: [name], female   DOB: 01/30/1955, 69 y.o.   MRN: [id number]  HPI: [name] is a 69 y.o.-year-old female, initially referred by her PCP, Dr. Norleen General (PA: Morene Sous), returning for f/u for LADA, dx 2005, insulin -dependent since ~2006, controlled, with complications (PN, gastroparesis?).  Last visit 3 months ago.    Interim history: She has bipolar disease and also has short-term memory loss, tension headaches, disequilibrium for which she sees neurology.  She is also seeing [facility].  She also has anxiety and sees a veterinary surgeon.  No increased urination, blurry vision, chest pain.  Reviewed history: She was on an Omnipod insulin  pump on 08/01/2015. She had severe anxiety and depression and got so overwhelmed with the insulin  pump that we had to take her off the pump.  We switched her to a basal-bolus insulin  regimen, but she was unable to calculate the insulin  doses based on insulin  to carb ratio, so now she is using fixed rapid acting insulin  doses.  Reviewed HbA1c levels: Lab Results  Component Value Date   HGBA1C 7.7 (A) 10/03/2023   HGBA1C 8.0 (A) 07/02/2023   HGBA1C 8.5 (A) 01/28/2023  05/07/2022: HbA1c calculated from fructosamine is 8.4%, slightly lower than before. 08/25/2021: HbA1c calculated from fructosamine is 8.6%, higher than before.  She is on: - Metformin  500 mg 2x a day with meals - Toujeo  20 units in am and 40 units at night - Lyumjev :   8-10 units for a snack  8-12 units before b'fast and lunch 12-16 units before dinner - Lyumjev  Sliding scale: 201-225: + 2 units 225-250: + 3 units 251-275: + 4 units >275: + 5 units If your sugars are <100 and you eat a low carb meal, inject only ~6 units. So, If sugars are <100 before the meals, you still need to take insulin  for that meal. Do not correct sugars <300 at bedtime. If sugars are <70 before a meal, bring the sugar up first, then inject insulin  for the meals. She was  previously on Lantus  and Tresiba .  She feels she does better on Toujeo  than on Tresiba   Pt checks her sugars >4x a day with her Dexcom CGM:   Prev.:  Previously:  Lowest sugar was 32 >>... 36 >> 39 >> 58; she has hypoglycemia awareness in the 50s Highest sugar was HI >> 400s >> 400. She was in the emergency room in 09/2022  after having had blood sugar of 494.  She was given IV insulin  and hydrated and discharged.  No signs of DKA. At today's visit, CBG 432.  They mention that she started to have high blood sugars approximately 3 days ago.  She does mention several panic attacks in this period.  No CKD, last BUN/creatinine:  06/13/2023: 18/0.7, GFR 93, Glu 282 Lab Results  Component Value Date   BUN 34 (H) 01/26/2023   CREATININE 0.70 01/26/2023   No microalbuminuria: Lab Results  Component Value Date   MICRALBCREAT 17 01/28/2023    + Dyslipidemia: 06/13/2023: 108/85/51/40 Lab Results  Component Value Date   CHOL 203 (H) 01/28/2023   HDL 55 01/28/2023   LDLCALC 100 (H) 01/28/2023   TRIG 357 (H) 01/28/2023   CHOLHDL 3.7 01/28/2023  She was started on Crestor  10 mg daily - but taken off >> now restarted after the above labs: 5 mg daily, on Ezetimibe now.  -Last eye exam was in 2025: No DR reportedly, but dry eyes >> Dr Octavia. Had cataract sx's.  -+  Numbness and tingling in her feet.  Also, occasional pain.  She was on Neurontin , then on nortriptyline , Lyrica . Now off.  She is also on alpha-lipoic acid.  She previously saw a podiatrist [facility]).  She had 2 toenails removed in the past due to fungal infection.  Last foot exam 01/28/2023.  Latest TSH was normal: 06/13/2023: TSH 1.75 07/30/2022: TSH 1.13 Lab Results  Component Value Date   TSH 0.533 03/03/2020   06/13/2023:   am cortisol 17.7 B12 1287 ([phone]) Vitamin D  38.6  Pt has a h/o admission for Li toxicity 04/2012.  She has a diagnosis of Parkinson's disease.  She also has bipolar  disease. Also: GERD, HTN, fibromyalgia. She continues to have headaches and posterior neck pain/mobility.  She was referred to neurology/neurosurgery.  She could not be evaluated until she stopped Xanax .  However, after stopping Xanax , she presented to the emergency room 01/26/2023 for high blood sugars, in the 400s, after having stopped Xanax  and in the setting of high anxiety.  DKA and HHS were ruled out.  She was given insulin  and sugars decreased to the 300s.  She was started on hydroxyzine  to help with anxiety and she started to feel better.. She also had an appointment with her psychiatrist and her anxiety medications were adjusted.As of now, she is on a pain medication patch.  ROS: + See HPI  I reviewed pt's medications, allergies, PMH, social hx, family hx, and changes were documented in the history of present illness. Otherwise, unchanged from my initial visit note.  Past Medical History:  Diagnosis Date   Anxiety    Bipolar 1 disorder (HCC)    Breast density 06/25/2012   Right breast density, will get mammogram and US    Depression    Diabetes mellitus without complication (HCC)    type 1   Duodenitis    Fibromyalgia    GERD (gastroesophageal reflux disease)    History of kidney stones    HTN (hypertension)    Hyperlipidemia    IBS (irritable bowel syndrome)    Neuropathy    Sepsis (HCC) 02/27/2019   Sepsis (HCC) 2023   Past Surgical History:  Procedure Laterality Date   ABDOMINAL HYSTERECTOMY     partial   CHOLECYSTECTOMY     COLONOSCOPY     CYSTOSCOPY WITH RETROGRADE PYELOGRAM, URETEROSCOPY AND STENT PLACEMENT Right 03/31/2019   Procedure: CYSTOSCOPY WITH RETROGRADE PYELOGRAM, URETEROSCOPY AND STENT PLACEMENT;  Surgeon: Elisabeth Valli BIRCH, MD;  Location: [facility] [place];  Service: Urology;  Laterality: Right;   CYSTOSCOPY WITH STENT PLACEMENT Right 03/02/2019   Procedure: CYSTOSCOPY WITH STENT PLACEMENT;  Surgeon: Elisabeth Valli BIRCH, MD;  Location: [facility] OR;   Service: Urology;  Laterality: Right;   HOLMIUM LASER APPLICATION Right 03/31/2019   Procedure: HOLMIUM LASER APPLICATION;  Surgeon: Elisabeth Valli BIRCH, MD;  Location: [facility];  Service: Urology;  Laterality: Right;   UPPER GASTROINTESTINAL ENDOSCOPY     Social History   Socioeconomic History   Marital status: Married    Spouse name: phillip   Number of children: 1   Years of education: 12   Highest education level: High school graduate  Occupational History   Occupation: Disabled  Tobacco Use   Smoking status: Never   Smokeless tobacco: Never  Vaping Use   Vaping status: Some Days   Substances: Flavoring  Substance and Sexual Activity   Alcohol use: No    Alcohol/week: 0.0 standard drinks of alcohol   Drug use:  No   Sexual activity: Not Currently  Other Topics Concern   Not on file  Social History Narrative   Lives at home w/ her husband   Left-handed   Caffeine: 1-2 cups of coffee daily   Social Drivers of Health   Tobacco Use: Low Risk (10/03/2023)   Patient History    Smoking Tobacco Use: Never    Smokeless Tobacco Use: Never    Passive Exposure: Not on file  Financial Resource Strain: Not on file  Food Insecurity: Not on file  Transportation Needs: Not on file  Physical Activity: Not on file  Stress: Not on file  Social Connections: Not on file  Intimate Partner Violence: Not on file  Depression (EYV7-0): Not on file  Alcohol Screen: Not on file  Housing: Not on file  Utilities: Not on file  Health Literacy: Not on file   Current Outpatient Medications on File Prior to Visit  Medication Sig Dispense Refill   acetaminophen  (TYLENOL ) 500 MG tablet Take 1,000 mg by mouth every 6 (six) hours as needed for mild pain or headache.     ALPRAZolam  (XANAX ) 1 MG tablet Take 1 mg by mouth 4 (four) times daily as needed for anxiety.  (Patient not taking: Reported on 10/03/2023)     amitriptyline (ELAVIL) 25 MG tablet Take 25 mg by mouth at bedtime.  (Patient not taking: Reported on 10/03/2023)     amLODipine  (NORVASC ) 5 MG tablet Take 5 mg by mouth daily.     aspirin  EC 81 MG tablet Take 1 tablet (81 mg total) by mouth daily. Swallow whole. 30 tablet 11   BD PEN NEEDLE NANO U/F 32G X 4 MM MISC USE WITH INSULIN  PEN FOUR TIMES A DAY 400 each 3   BENICAR 40 MG tablet Take 40 mg by mouth daily.     busPIRone  (BUSPAR ) 10 MG tablet Take 10 mg by mouth in the morning and at bedtime.     Butalbital-APAP-Caffeine 50-325-40 MG capsule Take 1 capsule by mouth 4 (four) times daily as needed. (Patient not taking: Reported on 10/03/2023)     cholecalciferol  (VITAMIN D3) 25 MCG (1000 UNIT) tablet Take 1,000 Units by mouth daily.     Continuous Glucose Sensor (DEXCOM G7 SENSOR) MISC Use to check glucose continuously, change sensor every 10 days 9 each 3   Docusate Sodium  (COLACE PO) Take 100 mg by mouth daily.     esomeprazole (NEXIUM) 40 MG capsule Take 40 mg by mouth 2 (two) times daily.     ezetimibe (ZETIA) 10 MG tablet Take 10 mg by mouth daily.     Glucagon  (BAQSIMI  ONE PACK) 3 MG/DOSE POWD Use 1 spray in the nose as needed for hypoglycemia 1 each PRN   glucose blood (FREESTYLE LITE) test strip USE TO CHECK BLOOD SUGAR 6 TIMES PER DAY 600 each 3   hydrOXYzine  (ATARAX ) 25 MG tablet Take 2 tablets (50 mg total) by mouth every 8 (eight) hours as needed. 18 tablet 0   insulin  glargine, 1 Unit Dial, (TOUJEO  SOLOSTAR) 300 UNIT/ML Solostar Pen Inject 50 Units into the skin daily. 18 mL 3   Insulin  Lispro-aabc (LYUMJEV  KWIKPEN) 200 UNIT/ML KwikPen Inject 13-18 Units into the skin 3 (three) times daily before meals. 27 mL 2   Insulin  Syringes, Disposable, U-100 1 ML MISC To use for insulin  injection.. 100 each 1   Lancets (FREESTYLE) lancets USE TO TEST BLOOD SUGAR 4 TO 6 TIMES DAILY AS INSTRUCTED 300 each 6   losartan  (COZAAR )  25 MG tablet Take 25 mg by mouth daily.     metFORMIN  (GLUCOPHAGE ) 500 MG tablet TAKE 1 TABLET TWICE A DAY WITH MEALS 180 tablet 1    methocarbamol (ROBAXIN) 500 MG tablet Take 500 mg by mouth 4 (four) times daily as needed.     nortriptyline  (PAMELOR ) 50 MG capsule Take 50 mg by mouth at bedtime.  (Patient not taking: Reported on 10/03/2023)     ondansetron  (ZOFRAN -ODT) 4 MG disintegrating tablet PLACE 1 TO 2 TABLETS ON TONGUE EVERY 4 TO 6 HOURS AS NEEDED FOR NAUSEA (Patient taking differently: Take 4-8 mg by mouth every 4 (four) hours as needed for nausea or vomiting.) 180 tablet 4   OSCIMIN  0.125 MG SUBL DISSOLVE 1 TABLET UNDER THE TONGUE EVERY 4 HOURS AS NEEDED (NEED APPOINTMENT) 360 tablet 0   pregabalin  (LYRICA ) 50 MG capsule START WITH TWICE DAILY. IF NO SIDE EFFECTS IN 1 WEEK MAY INCREASE TO 3X DAILY.*STOP GABAPENTIN * (Patient not taking: Reported on 10/03/2023) 90 capsule 2   Probiotic CAPS Take 1 capsule by mouth daily. 30 capsule 1   Propylene Glycol (SYSTANE BALANCE OP) Place 1 drop into both eyes daily as needed (dry eyes).     rosuvastatin  (CRESTOR ) 5 MG tablet Take 1 tablet (5 mg total) by mouth daily. 90 tablet 3   traZODone  (DESYREL ) 100 MG tablet 1  qhs  May increse to 2  qhs  After 1 week (Patient taking differently: Take 100 mg by mouth at bedtime. 1  qhs  May increse to 2  qhs  After 1 week) 180 tablet 1   venlafaxine  (EFFEXOR ) 37.5 MG tablet Take 37.5 mg by mouth daily.     vitamin B-12 (CYANOCOBALAMIN ) 1000 MCG tablet Take 1,000 mcg by mouth daily.     Vitamin D , Ergocalciferol , (DRISDOL) 1.25 MG (50000 UNIT) CAPS capsule Take 50,000 Units by mouth once a week.     Current Facility-Administered Medications on File Prior to Visit  Medication Dose Route Frequency Provider Last Rate Last Admin   bupivacaine  (MARCAINE ) 0.5 % (with pres) injection 1 mL  1 mL Infiltration Once Ines Onetha NOVAK, MD       lidocaine  (XYLOCAINE ) 2 % (with pres) injection 20 mg  1 mL Intradermal Once Ines Onetha NOVAK, MD       methylPREDNISolone  acetate (DEPO-MEDROL ) injection 80 mg  80 mg Intramuscular Once Ahern, Antonia B, MD        Allergies  Allergen Reactions   Abilify  [Aripiprazole ]     Patient is intolerant   Lantus  [Insulin  Glargine] Other (See Comments)    Elevated blood sugars   Phenergan  [Promethazine ] Diarrhea and Nausea And Vomiting   Reglan [Metoclopramide] Other (See Comments)    Chest pains   Family History  Problem Relation Age of Onset   Hypertension Mother    Bipolar disorder Mother    Cancer Mother    Depression Father    Cancer Father    Thyroid  disease Sister    Hypertension Sister    Bipolar disorder Sister    Heart disease Brother    Bipolar disorder Maternal Aunt    Bipolar disorder Daughter    Colon cancer Neg Hx    Colon polyps Neg Hx    Esophageal cancer Neg Hx    Kidney disease Neg Hx    Gallbladder disease Neg Hx    Diabetes Neg Hx    Dementia Neg Hx    Tremor Neg Hx    Seizures Neg Hx  Migraines Neg Hx    Headache Neg Hx    PE: BP 118/60   Pulse (!) 108   Ht 5' 4 (1.626 m)   Wt 178 lb (80.7 kg)   SpO2 95%   BMI 30.55 kg/m    Wt Readings from Last 3 Encounters:  01/03/24 178 lb (80.7 kg)  10/03/23 172 lb (78 kg)  07/02/23 174 lb 3.2 oz (79 kg)   Constitutional: overweight, in NAD Eyes:  EOMI, no exophthalmos ENT: no neck masses, no cervical lymphadenopathy Cardiovascular: tachycardia, RR, no MRG Respiratory: CTA B Musculoskeletal: no deformities Skin:no rashes, + many tattoos Neurological: no tremor with outstretched hands Diabetic Foot Exam - Simple   Simple Foot Form Diabetic Foot exam was performed with the following findings: Yes 01/03/2024 11:20 AM  Visual Inspection No deformities, no ulcerations, no other skin breakdown bilaterally: Yes Sensation Testing Intact to touch and monofilament testing bilaterally: Yes Pulse Check Posterior Tibialis and Dorsalis pulse intact bilaterally: Yes Comments + dry skin    ASSESSMENT: 1. 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  Component     Latest Ref Rng 07/16/2013  C-Peptide     0.80 - 3.90 ng/mL 1.29  Glucose     70 - 99 mg/dL 816 (H)  Glutamic Acid Decarb Ab     <=1.0 U/mL 11.3 (H)  Pancreatic Islet Cell Antibody     <5 JDF Units 5 (A)  + anti-pancreatic antibodies >> LADA rather than type 2 DM. She still has a positive C peptide >> still has insulin  secretion.  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 HL  PLAN:  1.  Patient with poorly controlled LADA, difficult to manage due to depression/anxiety/GI symptoms.  Her sugars are usually fluctuating between 30-40s to 300-400s.  At last visit, sugars continued to remain elevated, increasing overnight and peaking in the early morning hours and then dropping more abruptly, with a nadir around 3 PM and then increasing blood sugars afterwards, with a peak around 8 PM.  She mentions that she was taking her Lyumjev  injections as recommended.  To avoid low blood sugars after breakfast and lunch, I recommended to decrease the Lyumjev  doses before these meals but we continued the same dose with dinner since the high blood sugars after dinner could have been a consequence of sugars dropping too abruptly before this meal.  I advised her to try to take a higher dose of Toujeo  at night and a lower dose in the morning to avoid increasing blood sugars overnight.  HbA1c at last visit was lower, at 7.7%. - At last visit I also recommended the islet insulin  pump.  I explained the difference between this pump and other pumps.  They agreed to try it.  However,  her husband sent me a message since last visit mentioning that patient's blood sugars were better and they wanted to wait on the insulin  pump.  At today's visit, she mentions that due to her memory loss, she would be uncomfortable starting on the pump knowing that she would not be able to manage it.  We discussed about continuing without the pump in that case. CGM interpretation: -At  today's visit, we reviewed her CGM downloads: It appears that 30% of values are in target range (goal >70%), while 69% are higher than 180 (goal <25%), and 1% are lower than 70 (goal <4%).  The calculated average blood sugar is  219.  The projected HbA1c for the next 3 months (GMI) is 8.5%, increased from 9.3% in the previous 2 weeks.  She does mention that she relaxed her diet during the holidays but she is starting to work on this. -Reviewing the CGM trends, sugars appear to be decreasing fairly drastically after breakfast and increasing drastically after lunch.  Sugars overnight are almost entirely elevated.  We discussed about reducing the dose of her Lyumjev  before breakfast, increasing the dose before lunch and, if the sugars persistently elevated after these changes, to switch to taking the entire Toujeo  dose at night. - I suggested to:  Patient Instructions  Please use the following regimen: - Metformin  500 mg 2x a day with meals - Toujeo  20 units in am and 40 units at night (try just 60 units at bedtime) - Lyumjev :   8 units before b'fast  14 units before lunch 12-16 units before dinner 8-10 units for a snack - Lyumjev  Sliding scale: 201-225: + 2 units 225-250: + 3 units 251-275: + 4 units >275: + 5 units If your sugars are <100 and you eat a low carb meal, inject only ~10 units. So, If sugars are <100 before the meals, you still need to take insulin  for that meal. Do not correct sugars <300 at bedtime. If sugars are <70 before a meal, bring the sugar up first, then inject insulin  for the meals. Allow the sugars to drop to 80 before you start correcting them.  You can use: Eucerin or CeraVe lotion on your feet.  Please return in 3 months.  - we checked her HbA1c: 7.6% (lower) - advised to check sugars at different times of the day - 4x a day, rotating check times - advised for yearly eye exams >> she is UTD - at today's visit, we discussed about possible treatments for the dry  skin on her feet.  I recommended CeraVe and Eucerin lotions. - return to clinic in 3 months  2. PN  -Related to diabetes - Previously on nortriptyline , Lyrica , Neurontin  in the past but now off all of these.  She has a pain patch prescribed by the pain clinic. - On alpha lipoic acid 600 mg twice a day; she was also using the nervine Roll On - She had physical therapy for this in the past  3. HL - Latest lipid fractions were reviewed from 06/2023: Lipid panel significantly improved - Continues Crestor  5 mg daily and Zetia 10 mg daily with good tolerance  Lela Fendt, MD PhD [facility] Endocrinology

## 2024-01-03 NOTE — Patient Instructions (Addendum)
 Please use the following regimen: - Metformin  500 mg 2x a day with meals - Toujeo  20 units in am and 40 units at night (try just 60 units at bedtime) - Lyumjev :   8 units before b'fast  14 units before lunch 12-16 units before dinner 8-10 units for a snack - Lyumjev  Sliding scale: 201-225: + 2 units 225-250: + 3 units 251-275: + 4 units >275: + 5 units If your sugars are <100 and you eat a low carb meal, inject only ~10 units. So, If sugars are <100 before the meals, you still need to take insulin  for that meal. Do not correct sugars <300 at bedtime. If sugars are <70 before a meal, bring the sugar up first, then inject insulin  for the meals. Allow the sugars to drop to 80 before you start correcting them.  You can use: Eucerin or CeraVe lotion on your feet.  Please return in 3 months.

## 2024-01-03 NOTE — Addendum Note (Signed)
 Addended by: CLEOTILDE ROLIN RAMAN on: 01/03/2024 04:15 PM   Modules accepted: Orders

## 2024-01-08 ENCOUNTER — Other Ambulatory Visit: Payer: Self-pay

## 2024-01-08 DIAGNOSIS — E119 Type 2 diabetes mellitus without complications: Secondary | ICD-10-CM

## 2024-01-08 MED ORDER — BAQSIMI ONE PACK 3 MG/DOSE NA POWD: NASAL | 99 refills | Status: AC

## 2024-01-20 ENCOUNTER — Other Ambulatory Visit: Payer: Self-pay

## 2024-03-30 ENCOUNTER — Ambulatory Visit: Admitting: Internal Medicine
